# Patient Record
Sex: Male | Born: 1980 | Race: Black or African American | Hispanic: No | Smoking: Never smoker
Health system: Southern US, Community
[De-identification: ages and names within clinical notes are randomized; demographics above are authoritative.]

## PROBLEM LIST (undated history)

## (undated) DIAGNOSIS — I5042 Chronic combined systolic (congestive) and diastolic (congestive) heart failure: Secondary | ICD-10-CM

## (undated) DIAGNOSIS — Z9989 Dependence on other enabling machines and devices: Secondary | ICD-10-CM

## (undated) DIAGNOSIS — G43909 Migraine, unspecified, not intractable, without status migrainosus: Secondary | ICD-10-CM

## (undated) DIAGNOSIS — I10 Essential (primary) hypertension: Secondary | ICD-10-CM

## (undated) DIAGNOSIS — T8149XA Infection following a procedure, other surgical site, initial encounter: Secondary | ICD-10-CM

## (undated) DIAGNOSIS — G4733 Obstructive sleep apnea (adult) (pediatric): Secondary | ICD-10-CM

## (undated) DIAGNOSIS — I428 Other cardiomyopathies: Secondary | ICD-10-CM

## (undated) DIAGNOSIS — J45909 Unspecified asthma, uncomplicated: Secondary | ICD-10-CM

## (undated) DIAGNOSIS — Z973 Presence of spectacles and contact lenses: Secondary | ICD-10-CM

---

## 2001-08-12 HISTORY — PX: KNEE ARTHROSCOPY: SHX127

## 2012-02-10 ENCOUNTER — Emergency Department (HOSPITAL_COMMUNITY): Payer: Self-pay

## 2012-02-10 ENCOUNTER — Encounter (HOSPITAL_COMMUNITY): Payer: Self-pay

## 2012-02-10 ENCOUNTER — Inpatient Hospital Stay (HOSPITAL_COMMUNITY)
Admission: AD | Admit: 2012-02-10 | Discharge: 2012-02-12 | DRG: 292 | Disposition: A | Discharge status: Home / Self Care | Payer: MEDICAID | Source: Ambulatory Visit | Attending: Internal Medicine | Admitting: Internal Medicine

## 2012-02-10 DIAGNOSIS — E669 Obesity, unspecified: Secondary | ICD-10-CM

## 2012-02-10 DIAGNOSIS — I519 Heart disease, unspecified: Secondary | ICD-10-CM | POA: Diagnosis present

## 2012-02-10 DIAGNOSIS — I160 Hypertensive urgency: Secondary | ICD-10-CM | POA: Diagnosis present

## 2012-02-10 DIAGNOSIS — G4730 Sleep apnea, unspecified: Secondary | ICD-10-CM | POA: Diagnosis present

## 2012-02-10 DIAGNOSIS — I509 Heart failure, unspecified: Principal | ICD-10-CM | POA: Diagnosis present

## 2012-02-10 DIAGNOSIS — Z6841 Body Mass Index (BMI) 40.0 and over, adult: Secondary | ICD-10-CM

## 2012-02-10 DIAGNOSIS — I43 Cardiomyopathy in diseases classified elsewhere: Secondary | ICD-10-CM | POA: Diagnosis present

## 2012-02-10 DIAGNOSIS — R0601 Orthopnea: Secondary | ICD-10-CM | POA: Diagnosis present

## 2012-02-10 DIAGNOSIS — I110 Hypertensive heart disease with heart failure: Principal | ICD-10-CM | POA: Diagnosis present

## 2012-02-10 DIAGNOSIS — I5043 Acute on chronic combined systolic (congestive) and diastolic (congestive) heart failure: Secondary | ICD-10-CM

## 2012-02-10 DIAGNOSIS — R079 Chest pain, unspecified: Secondary | ICD-10-CM | POA: Diagnosis present

## 2012-02-10 DIAGNOSIS — R0602 Shortness of breath: Secondary | ICD-10-CM

## 2012-02-10 DIAGNOSIS — I5020 Unspecified systolic (congestive) heart failure: Secondary | ICD-10-CM | POA: Diagnosis present

## 2012-02-10 DIAGNOSIS — G4733 Obstructive sleep apnea (adult) (pediatric): Secondary | ICD-10-CM

## 2012-02-10 DIAGNOSIS — I10 Essential (primary) hypertension: Secondary | ICD-10-CM

## 2012-02-10 HISTORY — DX: Essential (primary) hypertension: I10

## 2012-02-10 LAB — CARDIAC PANEL(CRET KIN+CKTOT+MB+TROPI)
CK, MB: 4.4 ng/mL — ABNORMAL HIGH (ref 0.3–4.0)
Total CK: 313 U/L — ABNORMAL HIGH (ref 7–232)
Troponin I: <0.3 ng/mL (ref <0.30)
Troponin I: <0.3 ng/mL (ref <0.30)

## 2012-02-10 LAB — CBC WITH DIFFERENTIAL/PLATELET
Basophils Absolute: 0.1 10*3/uL (ref 0.0–0.1)
Basophils Relative: 1 % (ref 0–1)
Eosinophils Relative: 1 % (ref 0–5)
HCT: 45.4 % (ref 39.0–52.0)
MCHC: 32.4 g/dL (ref 30.0–36.0)
MCV: 86 fL (ref 78.0–100.0)
Monocytes Absolute: 0.6 10*3/uL (ref 0.1–1.0)
RDW: 12.9 % (ref 11.5–15.5)

## 2012-02-10 LAB — POCT I-STAT TROPONIN I

## 2012-02-10 LAB — BASIC METABOLIC PANEL
Calcium: 9.2 mg/dL (ref 8.4–10.5)
Creatinine, Ser: 1.16 mg/dL (ref 0.50–1.35)
GFR calc Af Amer: >90 mL/min (ref >90)
GFR calc non Af Amer: 83 mL/min — ABNORMAL LOW (ref >90)

## 2012-02-10 LAB — PRO B NATRIURETIC PEPTIDE: Pro B Natriuretic peptide (BNP): 422.3 pg/mL — ABNORMAL HIGH (ref 0–125)

## 2012-02-10 MED ORDER — CARVEDILOL 6.25 MG PO TABS
6.2500 mg | ORAL_TABLET | Freq: Two times a day (BID) | ORAL | Status: DC
  Administered 2012-02-11 – 2012-02-12 (×3): 6.25 mg via ORAL
  Filled 2012-02-10 (×7): qty 1

## 2012-02-10 MED ORDER — LABETALOL HCL 5 MG/ML IV SOLN
10.0000 mg | INTRAVENOUS | Status: DC | PRN
  Filled 2012-02-10: qty 4

## 2012-02-10 MED ORDER — ONDANSETRON HCL 4 MG/2ML IJ SOLN: 4.0000 mg | Freq: Three times a day (TID) | INTRAMUSCULAR | Status: DC | PRN

## 2012-02-10 MED ORDER — ACETAMINOPHEN 650 MG RE SUPP: 650.0000 mg | Freq: Four times a day (QID) | RECTAL | Status: DC | PRN

## 2012-02-10 MED ORDER — ONDANSETRON HCL 4 MG/2ML IJ SOLN: 4.0000 mg | Freq: Four times a day (QID) | INTRAMUSCULAR | Status: DC | PRN

## 2012-02-10 MED ORDER — SODIUM CHLORIDE 0.9 % IJ SOLN: 3.0000 mL | INTRAMUSCULAR | Status: DC | PRN

## 2012-02-10 MED ORDER — NITROGLYCERIN 0.4 MG SL SUBL: 0.4000 mg | SUBLINGUAL_TABLET | SUBLINGUAL | Status: DC | PRN

## 2012-02-10 MED ORDER — ASPIRIN 81 MG PO CHEW
324.0000 mg | CHEWABLE_TABLET | Freq: Once | ORAL | Status: AC
  Administered 2012-02-10: 324 mg via ORAL
  Filled 2012-02-10: qty 4

## 2012-02-10 MED ORDER — CARVEDILOL 3.125 MG PO TABS
3.1250 mg | ORAL_TABLET | Freq: Two times a day (BID) | ORAL | Status: DC
  Administered 2012-02-10: 3.125 mg via ORAL
  Filled 2012-02-10 (×2): qty 1

## 2012-02-10 MED ORDER — SODIUM CHLORIDE 0.9 % IJ SOLN
3.0000 mL | Freq: Two times a day (BID) | INTRAMUSCULAR | Status: DC
  Administered 2012-02-11 (×2): 3 mL via INTRAVENOUS

## 2012-02-10 MED ORDER — FUROSEMIDE 10 MG/ML IJ SOLN
40.0000 mg | Freq: Every day | INTRAMUSCULAR | Status: DC
  Administered 2012-02-10 – 2012-02-11 (×2): 40 mg via INTRAVENOUS
  Filled 2012-02-10 (×3): qty 4

## 2012-02-10 MED ORDER — ASPIRIN EC 81 MG PO TBEC
81.0000 mg | DELAYED_RELEASE_TABLET | Freq: Every day | ORAL | Status: DC
Start: 2012-02-10 — End: 2012-02-12
  Administered 2012-02-11 – 2012-02-12 (×2): 81 mg via ORAL
  Filled 2012-02-10 (×3): qty 1

## 2012-02-10 MED ORDER — ONDANSETRON HCL 4 MG PO TABS: 4.0000 mg | ORAL_TABLET | Freq: Four times a day (QID) | ORAL | Status: DC | PRN

## 2012-02-10 MED ORDER — SODIUM CHLORIDE 0.9 % IJ SOLN
3.0000 mL | Freq: Two times a day (BID) | INTRAMUSCULAR | Status: DC
  Administered 2012-02-10 – 2012-02-12 (×5): 3 mL via INTRAVENOUS

## 2012-02-10 MED ORDER — ACETAMINOPHEN 325 MG PO TABS: 650.0000 mg | ORAL_TABLET | Freq: Four times a day (QID) | ORAL | Status: DC | PRN

## 2012-02-10 MED ORDER — LISINOPRIL 20 MG PO TABS
20.0000 mg | ORAL_TABLET | Freq: Every day | ORAL | Status: DC
  Administered 2012-02-10 – 2012-02-12 (×3): 20 mg via ORAL
  Filled 2012-02-10 (×3): qty 1

## 2012-02-10 MED ORDER — SODIUM CHLORIDE 0.9 % IV SOLN
INTRAVENOUS | Status: DC
  Administered 2012-02-10: 125 mL/h via INTRAVENOUS

## 2012-02-10 NOTE — H&P (Signed)
Hospital Admission Note Date: 02/10/2012  Patient name: Mark Baker Medical record number: [id number] Date of birth: 1981-05-04 Age: 31 y.o. Gender: male PCP: Pcp Not In System  Medical Service: Internal Medicine Teaching Service, [facility] Service  Attending physician:  Dr. Doneen Poisson    1st Contact: Dr. Bronson Curb  Pager: [phone] 2nd Contact: Dr. Lyn Hollingshead   Pager: [phone] After 5 pm or weekends: 1st Contact:      Pager: [phone] 2nd Contact:      Pager: [phone]  Chief Complaint: Chest pain  History of Present Illness: Mark Baker is a 31 year old morbidly obese man with no previous PMH who presented to the [facility] ED with complaints of chest pain, DOE, and orthopnea.  The DOE has been since May 2013.  He states that when he is active, such as walking up stairs, or playing basketball, that he gets more short of breath then previously.  The chest pain then started about a week ago.  He states it is a substernal chest pain on the left side of his chest and goes down toward his left side.  The pain is a tightness and he feels like he can't catch his breath.  He states that it does get worse when he walks and is active but that it can come on when he is even just sitting down or laying down.  He denies diaphoresis, nausea, vomiting, diarrhea, and constipation. He is not a smoker, does not drink daily, and does not use any drugs.  He states that his father has heart failure and recently underwent pacemaker placement at age 60 for heart failure but no other cardiac history.    He also states that he has had problems sleeping for the last several months. He states that he has had to sleep kneeling at the side of the bed with his head on the bed, sitting up.  He states that this has gotten worse over the last week.  He states that if he lays flat that he feels like he is choking.  His wife, who is also in the room, states that at night he sounds like he stops breathing for 10-20 seconds at a time and  then "like he is catching up" after he stops.    Meds: None  Allergies: Allergies as of 02/10/2012  . (No Known Allergies)   Past Medical History  Diagnosis Date  . Asthma    Past Surgical History  Procedure Date  . Anterior cruciate ligament repair    Family History  Problem Relation Age of Onset  . Hypertension Father   . Heart failure Father   . Hypertension Sister   . Stroke Maternal Grandmother    History   Social History  . Marital Status: Married    Spouse Name: N/A    Number of Children: N/A  . Years of Education: N/A   Occupational History  . Not on file.   Social History Main Topics  . Smoking status: Never Smoker   . Smokeless tobacco: Not on file  . Alcohol Use: Yes     once a month  . Drug Use: No  . Sexually Active:    Other Topics Concern  . Not on file   Social History Narrative   Lives in [place] with wife of 4 years.  Works for [employer] at [employer].  1 son and 2 daughters.     Review of Systems: Constitutional: Denies fever, chills, diaphoresis, appetite change and fatigue.  HEENT: Denies photophobia, eye pain, redness, hearing loss, ear pain, congestion, sore throat, rhinorrhea, sneezing, mouth sores, trouble swallowing, neck pain, neck stiffness and tinnitus.   Respiratory: Positive for DOE, chest tightness.  Denies SOB, cough, and wheezing.   Cardiovascular: Positive for chest pain and leg swelling. Denies palpitations  Gastrointestinal: Denies nausea, vomiting, abdominal pain, diarrhea, constipation, blood in stool and abdominal distention.  Genitourinary: Denies dysuria, urgency, frequency, hematuria, flank pain and difficulty urinating.  Musculoskeletal: Denies myalgias, back pain, joint swelling, arthralgias and gait problem.  Skin: Denies pallor, rash and wound.  Neurological: Denies dizziness, seizures, syncope, weakness, light-headedness, numbness and headaches.  Hematological: Denies adenopathy. Easy bruising, personal or family  bleeding history  Psychiatric/Behavioral: Denies suicidal ideation, mood changes, confusion, nervousness, sleep disturbance and agitation  Physical Exam: Blood pressure 179/115, pulse 112, temperature 99.1 F (37.3 C), temperature source Oral, resp. rate 22, height 6\' 3"  (1.905 m), weight 431 lb (195.5 kg), SpO2 97.00%. Constitutional: Vital signs reviewed.  Patient is a well-developed and well-nourished obese man in no acute distress and cooperative with exam. Alert and oriented x3.  Head: Normocephalic and atraumatic Ear: TM normal bilaterally Mouth: no erythema or exudates, MMM Eyes: PERRL, EOMI, conjunctivae normal, No scleral icterus.  no optical disc cupping or AV nicking Neck: Supple, Trachea midline normal ROM, unable to assess JVD because of the size of his neck.  no mass, thyromegaly, or carotid bruit present.  Cardiovascular: Tachycardic but regular S1 normal, S2 normal, no MRG, pulses symmetric and intact bilaterally Pulmonary/Chest: Mild bibasilar crackles on inspiration. no wheezes, or rhonchi Abdominal: obese, Soft. Non-tender, non-distended, bowel sounds are normal, no masses, organomegaly, or guarding present.  GU: no CVA tenderness Musculoskeletal: 2+ pitting edema to the bilateral knees.  No joint deformities, erythema, or stiffness, ROM full and no nontender Hematology: no cervical, inginal, or axillary adenopathy.  Neurological: A&O x3, Strength is normal and symmetric bilaterally, cranial nerve II-XII are grossly intact, no focal motor deficit, sensory intact to light touch bilaterally.  Skin: Warm, dry and intact. No rash, cyanosis, or clubbing.  Psychiatric: Normal mood and affect. speech and behavior is normal. Judgment and thought content normal. Cognition and memory are normal.   Lab results: Basic Metabolic Panel:  [facility] 02/10/12 0835  NA 142  K 3.9  CL 103  CO2 28  GLUCOSE 111*  BUN 11  CREATININE 1.16  CALCIUM 9.2  MG --  PHOS --    CBC:  Basename 02/10/12 0835  WBC 7.6  NEUTROABS 5.0  HGB 14.7  HCT 45.4  MCV 86.0  PLT 272   BNP:  Basename 02/10/12 0838  PROBNP 422.3*   CBG:  Basename 02/10/12 0835  GLUCAP 101*   Imaging results:  Dg Chest 2 View  02/10/2012  *RADIOLOGY REPORT*  Clinical Data: Shortness of breath, chest pain  CHEST - 2 VIEW  Comparison: None.  Findings: Borderline cardiomegaly.  There is mild interstitial prominence bilaterally with perihilar increased bronchial markings. The findings suspicious for mild interstitial edema.  No focal infiltrate.  IMPRESSION: Mild perihilar interstitial prominence with crowding of bronchovascular markings suspicious for interstitial edema.  No focal infiltrate.  Borderline cardiomegaly.  Original Report Authenticated By: Natasha Mead, M.D.   Other results: EKG: sinus tachycardia, likely biatrial enlargement no ST segment changes.   Assessment & Plan by Problem: 1. Chest pain: Mr. Gassett presented with substernal chest pain that is at least partially worsened with activity.  He also has significant orthopnea, bilateral pitting edema, and elevated  blood pressure.  Differential diagnosis includes unstable angina or new onset congestive heart failure.  He has a family history of heart failure as well as hypertension and was told that he should be taking medications for his blood pressure 4 years ago.    - Admit to telemetry  - Cycle CE x3  - Repeat EKG in am   - Nitro PRN  - 2D echo  - TSH, FLP, and A1C for risk stratification  - Lasix 40 mg IV daily  - Strict I&O and daily weights  - Consider Cards consult for possible ischemic workup.  He is too large for coronary CT as well as the treadmill.  He might also be too large for the Lexiscan.    2. Hypertensive Urgency:  On presentation to the ED his blood pressure was 203/136.  He has no symptoms of end organ damage other then cardiomegaly which likely indicates that he has had hypertension for sometime.  He does  not remember the name of the medications he was given previously but with his question of possible CHF we will initiate treatment as if he does have CHF.    - Start Lisinopril 20 mg and Coreg 3.125 mg BID.    - PRN Labetolol for SBP <180.   3. Sleep apnea: With his wife's report of sleep disordered breathing and apnea episodes we will start him on CPAP while he is in the hospital.  This is also likely contributing to his possible CHF as well as directly to his hypertension.  He will need a sleep study as an outpatient to qualify for long term CPAP therapy.    4.  Morbid Obesity: This is likely contributing to his blood pressure and sleep apnea.  We discussed that he needs to start working on losing some weight to help prevent the complications that come with his obesity.  We will have nutritional therapy come and give him information for a heart healthy diet and also a possible weight loss diet.   5.  Disposition:  He needs to get set up with a PCP and currently has no insurance even though he works for [employer].  I will have CSW come by to see if there is any resources that he can take to help him obtain a CPAP as well as his medications until he is able to sign up for insurance during open enrollment.    6.  VTE: SCDs  Signed: Cricket Goodlin 02/10/2012, 12:34 PM

## 2012-02-10 NOTE — ED Notes (Signed)
Spoke to Jones, Charity fundraiser in CDU.  Transporting pt to CDU 7.

## 2012-02-10 NOTE — ED Notes (Signed)
Report called to accepting RN on 2000. Will hold patient here 25 minutes to accommodate staffing.

## 2012-02-10 NOTE — ED Provider Notes (Signed)
31 year old male has been having episodes of chest tightness. He is also noted some dyspnea and fatigue. Tetanus is not definitely exertional and he stated as they're mostly in the morning. He has a history of hypertension but is not on any medication for quite some time. Exam shows an obese male in no acute distress. Lungs are clear and heart has regular rate and rhythm. There is 2+ pitting edema which patient was not aware of. Blood pressure is very elevated. There are no old records to see what his blood pressure had been previously. There is also a family history of premature cardiac disease. He will need to be admitted to the CDU under her chest pain protocol for serial cardiac markers and stress testing. His BMI is too high to get coronary CT scan. He'll also need to be started on medication for his blood pressure.   Date: 02/10/2012  Rate: 110  Rhythm: sinus tachycardia  QRS Axis: normal  Intervals: normal  ST/T Wave abnormalities: normal  Conduction Disutrbances:none  Narrative Interpretation: Sinus tachycardia, left atrial hypertrophy, right atrial hypertrophy. No old ECG available for comparison.  Old EKG Reviewed: none available    Dione Booze, MD 02/10/12 [phone]

## 2012-02-10 NOTE — ED Provider Notes (Signed)
History     CSN: [id number]  Arrival date & time 02/10/12  0807   First MD Initiated Contact with Patient 02/10/12 [phone]      Chief Complaint  Patient presents with  . Chest Pain    (Consider location/radiation/quality/duration/timing/severity/associated sxs/prior treatment) HPI  Patient who is morbidly obese and has been told in the past that he has high blood pressure however he is not taking medicines on a daily basis and is not managed by primary care provider presents to emergency department complaining of a one to two-month history of shortness of breath and orthopnea stating that he does not ever get a goodnight sleep due to tossing and turning and feeling short of breath at night. Patient states that there has been some nights that he's actually kneeled at the edge of the bed and slept that over the bed sitting up. Patient is also complaining of a one week history of intermittent substernal chest tightness with associated shortness of breath. Patient states that the chest pain and shortness of breath is intermittent and happens sometimes when he is exerting himself and sometimes at rest. Patient denies any associated diaphoresis, nausea, vomiting, or radiation of pain. Patient states that his father is 67 and has recently required a pacemaker but that he has no other known information about his cardiac health and denies any other known family history of early cardiac disease. Patient denies tobacco use. He denies illicit drug use. Patient has taken nothing for symptoms prior to arrival. Patient states he last had CP at 5am this morning lasting until approximately 5:05 and states "I just got tire of feeling so bad so I decided to get this checked out."   No past medical history on file.  No past surgical history on file.  No family history on file.  History  Substance Use Topics  . Smoking status: Not on file  . Smokeless tobacco: Not on file  . Alcohol Use: Not on file       Review of Systems  All other systems reviewed and are negative.    Allergies  Review of patient's allergies indicates no known allergies.  Home Medications  No current outpatient prescriptions on file.  BP 203/136  Pulse 113  Temp 98.1 F (36.7 C) (Oral)  Resp 22  SpO2 95%  Physical Exam  Nursing note and vitals reviewed. Constitutional: He is oriented to person, place, and time. He appears well-developed. No distress.       morbidly obese  HENT:  Head: Normocephalic and atraumatic.  Eyes: Conjunctivae are normal.  Neck: Normal range of motion. Neck supple.  Cardiovascular: Normal rate, regular rhythm, normal heart sounds and intact distal pulses.  Exam reveals no gallop and no friction rub.   No murmur heard. Pulmonary/Chest: Effort normal and breath sounds normal. No respiratory distress. He has no wheezes. He has no rales. He exhibits no tenderness.       Poor lung exam given body habitus with breath sound difficult to auscultate but no resp distress  Abdominal: Soft. Bowel sounds are normal. He exhibits no distension and no mass. There is no tenderness. There is no rebound and no guarding.  Musculoskeletal: Normal range of motion. He exhibits edema. He exhibits no tenderness.       Trace edema bilaterally of bilateral LE.   Neurological: He is alert and oriented to person, place, and time.  Skin: Skin is warm and dry. No rash noted. He is not diaphoretic. No erythema.  Psychiatric:  He has a normal mood and affect.    ED Course  Procedures (including critical care time)  PO ASA, IV fluids TKO  Labs Reviewed  BASIC METABOLIC PANEL - Abnormal; Notable for the following:    Glucose, Bld 111 (*)     GFR calc non Af Amer 83 (*)     All other components within normal limits  PRO B NATRIURETIC PEPTIDE - Abnormal; Notable for the following:    Pro B Natriuretic peptide (BNP) 422.3 (*)     All other components within normal limits  GLUCOSE, CAPILLARY - Abnormal;  Notable for the following:    Glucose-Capillary 101 (*)     All other components within normal limits  CBC WITH DIFFERENTIAL  POCT I-STAT TROPONIN I   Dg Chest 2 View  02/10/2012  *RADIOLOGY REPORT*  Clinical Data: Shortness of breath, chest pain  CHEST - 2 VIEW  Comparison: None.  Findings: Borderline cardiomegaly.  There is mild interstitial prominence bilaterally with perihilar increased bronchial markings. The findings suspicious for mild interstitial edema.  No focal infiltrate.  IMPRESSION: Mild perihilar interstitial prominence with crowding of bronchovascular markings suspicious for interstitial edema.  No focal infiltrate.  Borderline cardiomegaly.  Original Report Authenticated By: Natasha Mead, M.D.     1. HTN (hypertension)   2. Obesity   3. Chest pain   4. SOB (shortness of breath)     Dr. Preston Fleeting agreeable to assessment and plan.   MDM  VSS though remains mildly tach and hypertense denies current CP. OPC to see patient in ER to admit.         [place], [place] 02/10/12 1032

## 2012-02-10 NOTE — ED Notes (Signed)
Heart Healthy tray ordered spoke with Eber Jones

## 2012-02-10 NOTE — ED Notes (Signed)
Pt reports 2 months of intermittent central chest tightness.  Pt reports will have SOB at times, but not all the time.  Pt states he was on BP meds about 4 years ago, but MD only gave him 30 day supply.  Pt denies he was to follow up about BP.  Pt is hypertensive here.  Current BP  180/127.  Pt denies any pain at this time.

## 2012-02-10 NOTE — ED Notes (Signed)
Report called to 2005 RN: Shanda Bumps.

## 2012-02-10 NOTE — ED Notes (Signed)
Pt's CBG was 101 when I checked it. 8:38am JG.

## 2012-02-11 ENCOUNTER — Encounter (HOSPITAL_COMMUNITY): Payer: Self-pay | Admitting: General Practice

## 2012-02-11 LAB — LIPID PANEL
Cholesterol: 188 mg/dL (ref 0–200)
LDL Cholesterol: 117 mg/dL — ABNORMAL HIGH (ref 0–99)
VLDL: 36 mg/dL (ref 0–40)

## 2012-02-11 LAB — CARDIAC PANEL(CRET KIN+CKTOT+MB+TROPI)
CK, MB: 3.9 ng/mL (ref 0.3–4.0)
Relative Index: 1.5 (ref 0.0–2.5)
Troponin I: <0.3 ng/mL (ref <0.30)

## 2012-02-11 LAB — BASIC METABOLIC PANEL
CO2: 29 mEq/L (ref 19–32)
Calcium: 9 mg/dL (ref 8.4–10.5)
Creatinine, Ser: 1.12 mg/dL (ref 0.50–1.35)
GFR calc non Af Amer: 86 mL/min — ABNORMAL LOW (ref >90)

## 2012-02-11 LAB — TSH: TSH: 0.618 u[IU]/mL (ref 0.350–4.500)

## 2012-02-11 LAB — HEMOGLOBIN A1C: Mean Plasma Glucose: 114 mg/dL (ref <117)

## 2012-02-11 NOTE — Progress Notes (Signed)
Clinical Social Work Department BRIEF PSYCHOSOCIAL ASSESSMENT 02/11/2012  Patient:  Mark Baker, Mark Baker     Account Number:  [id number]     Admit date:  02/10/2012  Clinical Social Worker:  Conley Simmonds  Date/Time:  02/11/2012 03:00 PM  Referred by:  Physician  Date Referred:  02/11/2012 Referred for  Psychosocial assessment   Other Referral:   Interview type:  Patient Other interview type:    PSYCHOSOCIAL DATA Living Status:  FAMILY Admitted from facility:   Level of care:   Primary support name:  Rande Lawman- Primary support relationship to patient:  SPOUSE Degree of support available:   Strong    CURRENT CONCERNS Current Concerns  Financial Resources   Other Concerns:    SOCIAL WORK ASSESSMENT / PLAN CSW met with pt at bedside to discuss financial concerns and medication assistance-pt is an employee of [employer] and is currently uninsured has discussed options including outpatient pharmacy with regards to prescriptions.  Pt has stable housing and employment at this time   Assessment/plan status:  Other - See comment Other assessment/ plan:   Information/referral to community resources:   [facility]    PATIENT'S/FAMILY'S RESPONSE TO PLAN OF CARE: Pt in agreement with plan of care in terms of medication assistance and is also in agreement with follow up in [facility]-  If pt has appointment arranged CSW will notify OPCSW and will continue to follow in order to assistance with medication management-- Jodean Lima, [phone]

## 2012-02-11 NOTE — Progress Notes (Signed)
Checked with pt about CPAP and he states he can place himself on later when he is ready to go to sleep. Told to notify RN for Respiratory if he needed any further assistance during the night.

## 2012-02-11 NOTE — Plan of Care (Signed)
Problem: Not Ready for Diet/Lifestyle Change (NB-1.3) Goal: Nutrition education Formal process to instruct or train a patient/client in a skill or to impart knowledge to help patients/clients voluntarily manage or modify food choices and eating behavior to maintain or improve health.  Outcome: Completed/Met Date Met:  02/11/12 RD consult for Heart Healthy/Weight Loss education. Reviewed guidelines and recommendations. Patient not very interested in sharing typical eating behavior/preferences. Handouts provided from Academy [employer]etics: Heart Healthy Nutrition Therapy & Weight Loss Tips. Expect poor compliance. PO intake 100%; BMI = 54 kg/m2 (Obesity Class III). No further intervention indicated at this time.  Alger Memos Pager #: [phone]

## 2012-02-11 NOTE — H&P (Signed)
Internal Medicine Attending Admission Note Date: 02/11/2012  Patient name: Mark Baker Medical record number: [id number] Date of birth: 19-Jul-1981 Age: 31 y.o. Gender: male  I saw and evaluated the patient. I reviewed the resident's note and I agree with the resident's findings and plan as documented in the resident's note.  Chief Complaint(s):  Chest tightness and dyspnea on exertion.  History - key components related to admission:  Mark Baker is a 31 year old man with a history of hypertension for4 years who has not seen a physician since diagnosis who presents with chest tightness and worsening dyspnea on exertion and generalized fatigue for 2 months. The symptoms have been progressive over that time. Over the last week he notes chest pain that is left of the sternum and characterized as a poking sensation that can occur with exertion or at rest. This pain is associated with occasional dyspnea but does not radiate and is not associated with nausea or diaphoresis. The discomfort is relieved with leaning forward. He denies a history of previous tobacco use and also denies drug use. Of note, his father has a history of heart disease that sounds like atrial fibrillation. He apparently was not compliant and recently had an AV ablation with pacemaker placement as best as we can tell. Mark Baker admits that his father has not been taking care of himself by using drugs and being noncompliant with his medical regimen. Also of note, his wife witnesses apneic episodes during the night followed by him getting up gasping for air. He admits to occasional orthopnea at 2 pillows but at other times is able to lie flat without any problems. As the symptoms were progressive over the last several months he decided to present to the emergency department for further evaluation.  Physical Exam - key components related to admission:  Filed Vitals:   02/10/12 1741 02/10/12 2058 02/11/12 0400 02/11/12 1321  BP: 169/108  175/109 157/109 170/103  Pulse: 111 101 96 100  Temp:  98.4 F (36.9 C) 97.7 F (36.5 C) 98.9 F (37.2 C)  TempSrc:  Oral Oral Oral  Resp:  22 20 19   Height:      Weight:      SpO2:  99% 95% 100%   General: Well-developed, well-nourished, morbidly obese man sitting comfortably in bed in no acute distress. Lungs: Clear to auscultation bilaterally without wheezes, rhonchi, or rales. Heart: Regular rate and rhythm without murmurs, rubs, or gallops. Abdomen: Soft, nontender, active bowel sounds. Extremities: Trace pitting edema bilaterally.  Lab results:  Basic Metabolic Panel:  Basename 02/11/12 0313 02/10/12 0835  NA 143 142  K 3.9 3.9  CL 103 103  CO2 29 28  GLUCOSE 105* 111*  BUN 12 11  CREATININE 1.12 1.16  CALCIUM 9.0 9.2  MG -- --  PHOS -- --   CBC:  Basename 02/10/12 0835  WBC 7.6  NEUTROABS 5.0  HGB 14.7  HCT 45.4  MCV 86.0  PLT 272   Cardiac Enzymes:  Basename 02/11/12 0313 02/10/12 2146 02/10/12 1528  CKTOTAL 260* 298* 313*  CKMB 3.9 4.2* 4.4*  CKMBINDEX -- -- --  TROPONINI <0.30 <0.30 <0.30   CBG:  Basename 02/10/12 0835  GLUCAP 101*   Hemoglobin A1C:  Basename 02/10/12 1558  HGBA1C 5.6   Fasting Lipid Panel:  Basename 02/11/12 0313  CHOL 188  HDL 35*  LDLCALC 117*  TRIG 180*  CHOLHDL 5.4  LDLDIRECT --   Thyroid Function Tests:  Basename 02/10/12 1558  TSH 0.618  T4TOTAL --  FREET4 --  T3FREE --  THYROIDAB --   Imaging results:   CXR Cardiomegaly, otherwise unremarkable  Other results:  EKG: Normal sinus tachycardia at 110 beats per minute, normal axis, normal intervals, poor R wave progression, no ST or T wave changes.  Assessment & Plan by Problem:  Mark Baker is a 31 year old morbidly obese man with a history of untreated hypertension who presents with progressive shortness of breath and chest tightness. The differential includes a hypertrophic cardiomyopathy related to the untreated hypertension that is resulting in  diastolic dysfunction. The differential includes a dilated cardiomyopathy related to long-standing untreated hypertension and left ventricular systolic dysfunction. He does not have a history of a prodrome to suggest he's got an acute viral cardiomyopathy. There is no evidence that he has other diseases that would be consistent with a infiltrative cardiomyopathy.  1) Possible cardiomyopathy: We are waiting the results of the echocardiogram that was obtained this afternoon. If it shows diastolic dysfunction and left ventricular hypertrophy we will aggressively treat his blood pressure with ACE and beta blockade. Further titration of these medications in addition to other sntihypertensives if necessary can take place in the outpatient clinic. If he is found to have left ventricular dysfunction we will add Lasix to the above regimen.  2) Hypertension: We will continue lisinopril and the beta blocker. If he has normal left ventricular function we will consider the addition of hydrochlorothiazide as a diuretic and antihypertensive. Obviously if he's got left ventricular dysfunction we will replace the hydrochlorothiazide with Lasix. His hypertension is chronic and will require further titration of medications in the outpatient clinic.  3) Disposition: I agree with the housestaff's plan to discharge Mark Baker home today after we get the results of the echocardiogram which will help guide our outpatient management. [facility] within one to 2 weeks. We will also try to arrange a sleep study given our suspicion for a diagnosis of obstructive sleep apnea.

## 2012-02-11 NOTE — Progress Notes (Signed)
Subjective: Pt was seen this morning after returning from echo. Sitting in bed playing with children and eating lunch. He says he has no complaints. Denies CP, SOB, headaches, N/V, dysuria. Did not require prn antihypertensives last night.   Objective: Vital signs in last 24 hours: Filed Vitals:   02/10/12 1741 02/10/12 2058 02/11/12 0400 02/11/12 1321  BP: 169/108 175/109 157/109 170/103  Pulse: 111 101 96 100  Temp:  98.4 F (36.9 C) 97.7 F (36.5 C) 98.9 F (37.2 C)  TempSrc:  Oral Oral Oral  Resp:  22 20 19   Height:      Weight:      SpO2:  99% 95% 100%   Weight change:   Intake/Output Summary (Last 24 hours) at 02/11/12 1336 Last data filed at 02/11/12 1230  Gross per 24 hour  Intake    840 ml  Output      0 ml  Net    840 ml   BP 170/103  Pulse 100  Temp 98.9 F (37.2 C) (Oral)  Resp 19  Ht 6\' 3"  (1.905 m)  Wt 195.5 kg (431 lb)  BMI 53.87 kg/m2  SpO2 100%  General Appearance:    Alert, cooperative, no distress  Head:    Normocephalic, without obvious abnormality, atraumatic  Eyes:    PERRL, conjunctiva/corneas clear, EOM's intact   Throat:   Lips, mucosa, and tongue normal; teeth and gums normal  Lungs:     Clear to auscultation bilaterally, respirations unlabored  Heart:    Regular rate and rhythm, S1 and S2 normal, no murmur, rub   or gallop  Abdomen:     Soft, non-tender, bowel sounds active all four quadrants,    no masses, no organomegaly  Extremities:   Trace bilateral LE edema  Pulses:   2+ and symmetric all extremities  Neurologic:   CNII-XII intact.    Lab Results: Basic Metabolic Panel:  Lab 02/11/12 0865 02/10/12 0835  NA 143 142  K 3.9 3.9  CL 103 103  CO2 29 28  GLUCOSE 105* 111*  BUN 12 11  CREATININE 1.12 1.16  CALCIUM 9.0 9.2  MG -- --  PHOS -- --   CBC:  Lab 02/10/12 0835  WBC 7.6  NEUTROABS 5.0  HGB 14.7  HCT 45.4  MCV 86.0  PLT 272   Cardiac Enzymes:  Lab 02/11/12 0313 02/10/12 2146 02/10/12 1528  CKTOTAL 260* 298*  313*  CKMB 3.9 4.2* 4.4*  CKMBINDEX -- -- --  TROPONINI <0.30 <0.30 <0.30   BNP:  Lab 02/10/12 0838  PROBNP 422.3*  CBG:  Lab 02/10/12 0835  GLUCAP 101*   Hemoglobin A1C:  Lab 02/10/12 1558  HGBA1C 5.6   Fasting Lipid Panel:  Lab 02/11/12 0313  CHOL 188  HDL 35*  LDLCALC 117*  TRIG 180*  CHOLHDL 5.4  LDLDIRECT --   Thyroid Function Tests:  Lab 02/10/12 1558  TSH 0.618  T4TOTAL --  FREET4 --  T3FREE --  THYROIDAB --   Studies/Results: Dg Chest 2 View  02/10/2012  *RADIOLOGY REPORT*  Clinical Data: Shortness of breath, chest pain  CHEST - 2 VIEW  Comparison: None.  Findings: Borderline cardiomegaly.  There is mild interstitial prominence bilaterally with perihilar increased bronchial markings. The findings suspicious for mild interstitial edema.  No focal infiltrate.  IMPRESSION: Mild perihilar interstitial prominence with crowding of bronchovascular markings suspicious for interstitial edema.  No focal infiltrate.  Borderline cardiomegaly.  Original Report Authenticated By: Natasha Mead, M.D.  Medications: I have reviewed the patient's current medications. Scheduled Meds:   . aspirin EC  81 mg Oral Daily  . carvedilol  6.25 mg Oral BID WC  . furosemide  40 mg Intravenous Daily  . lisinopril  20 mg Oral Daily  . sodium chloride  3 mL Intravenous Q12H  . sodium chloride  3 mL Intravenous Q12H  . DISCONTD: carvedilol  3.125 mg Oral BID WC   Continuous Infusions:  PRN Meds:.acetaminophen, acetaminophen, nitroGLYCERIN, ondansetron (ZOFRAN) IV, ondansetron, sodium chloride, DISCONTD: labetalol, DISCONTD: ondansetron (ZOFRAN) IV  Assessment/Plan: Assessment & Plan by Problem:  1. Chest pain: Mr. Leather presented with substernal chest pain that is at least partially worsened with activity. He also has significant orthopnea, bilateral pitting edema, and elevated blood pressure. niital concern for unstable angina. CE negative x3 and no EKG changes suggesting acute or remote  ischemia. Also concern for some new onset congestive heart failure given a  family history of heart failure as well as hypertension. Sleep apnea may also be contributing to R sided heart stress. Risk stratification shows normal TSH, lipids slightly elevated w TotChl 188, LDL 117 and HDL 35, but not fasting. A1c is normal at 5.6. - Nitro PRN  - 2D echo pending. Consider Cards consult for possible ischemic workup. He is too large for coronary CT as well as the treadmill. He might also be too large for the Lexiscan. If no acute decompensation requiring immediate intervention, will discharge today for follow-up as outpt.  - Lasix 40 mg IV qd  - Strict I&O and daily weights    2. Hypertensive Urgency: On presentation to the ED his blood pressure was 203/136. He has no symptoms of end organ damage other then cardiomegaly which likely indicates that he has had hypertension for sometime. He does not remember the name of the medications he was given previously but with his question of possible CHF we will initiate treatment as if he does have CHF.  Started Lisinopril 20 mg and Coreg 3.125 mg BID. Increased coreg to 6.25 BID as pt remained tachy at low dose. BP lowered significantly to 157/109 this morning but still high. Will hold prn antihypertensives as pt has long standing HTN and we do not want to give him hypotension that will abruptly decrease cerebral perfusion pressure.  - Call MD for BP 180/110. If no sx, hold antihypertensives. If >220/120, consider low dose prn labetalol. .  3. Sleep apnea: With his wife's report of sleep disordered breathing and apnea episodes we will start him on CPAP while he is in the hospital. This is also likely contributing to his possible CHF as well as directly to his hypertension. He will need a sleep study as an outpatient to qualify for long term CPAP therapy.  -Sleep study as outpt  4. Morbid Obesity: This is likely contributing to his blood pressure and sleep apnea. We  discussed that he needs to start working on losing some weight to help prevent the complications that come with his obesity. We will have nutritional therapy come and give him information for a heart healthy diet and also a possible weight loss diet.   5. Disposition: He needs to get set up with a PCP and currently has no insurance even though he works for [employer]. I will have CSW come by to see if there is any resources that he can take to help him obtain a CPAP as well as his medications until he is able to sign up for insurance  during open enrollment. Social work to see today, will refer to case mgmt for possibly CPAP, although likely will need sleep study first.   6. VTE: SCDs    LOS: 1 day   Bronson Curb 02/11/2012, 1:36 PM

## 2012-02-11 NOTE — ED Provider Notes (Signed)
Medical screening examination/treatment/procedure(s) were conducted as a shared visit with non-physician practitioner(s) and myself.  I personally evaluated the patient during the encounter   Burl Tauzin, MD 02/11/12 0712 

## 2012-02-11 NOTE — Discharge Summary (Signed)
Internal Medicine Teaching [facility] Discharge Note  Name: Nadia Torr MRN: [id number] DOB: January 16, 1981 31 y.o.  Date of Admission: 02/10/2012  8:12 AM Date of Discharge: 02/12/2012 Attending Physician: Rocco Serene, MD  Discharge Diagnosis: Principal Problem:  *Chest pain Active Problems:  Hypertensive urgency  Orthopnea  Sleep apnea  Morbid obesity Systolic CHF w reduced LV EF, NYHA 2 Grade 2 diastolic dysfunction  Discharge Medications: Medication List  As of 02/12/2012  1:42 PM   TAKE these medications         carvedilol 6.25 MG tablet   Commonly known as: COREG   Take 1 tablet (6.25 mg total) by mouth 2 (two) times daily with a meal.      lisinopril-hydrochlorothiazide 20-12.5 MG per tablet   Commonly known as: PRINZIDE,ZESTORETIC   Take 1 tablet by mouth daily. Please take tablet in the morning.            Disposition and follow-up:   Mr.Syre Aamodt was discharged from [facility] in Good condition.  At the hospital follow up visit please address management of heart failure. We recommend: 1) The clinic provider consulting cards to evaluate if pt is candidate for cath to evaluate for diffuse ischemic cardiomyopathy.  2) Get BMet in clinic to follow-up renal function on ACE. Will will maximize his lisinopril to 40mg  qd. In the meantime. Will also increase HCTZ to 25qd and continue Coreg at 6.25BID as pt handled dose well in hospital.  3) Assess for need to initiate lasix therapy 4) If pt has SOB, fatiuge call oncall resident page [phone] . Recommend that resident on call prescribes 80mg  lasix qd x 3 days followed by 40mg  qd until clinic appointment We are also concerned as pt has symptoms of obstructive sleep apnea. We recommend sleep study.   Follow-up Appointments: Follow-up Information    Follow up with Annett Gula, MD on 02/27/2012. (Please come to the above address at 1:15 pm for your appt with Dr. Shirlee Latch)    Contact information:   [address] [place] [address] [phone]         Discharge Orders    Future Appointments: Provider: Department: Dept Phone: Center:   02/27/2012 1:30 PM Annett Gula, MD [facility] [phone] [facility]     Future Orders Please Complete By Expires   Diet - low sodium heart healthy      Increase activity slowly      Discharge instructions      Comments:   1. Please come to your clinic appointment on February 27, 2012 at the [facility] with Dr. Shirlee Latch at 1:30pm. We will need to check your kidney function and possibly adjust your blood pressure medications. 2. You have been prescribed 3 new medicines for your hypertension. You will take Coreg twice a day. The other pill is a combination pill of lisinopril and HCTZ. You will take this pill once in the morning. 3. We will call you with the results of your echocardiogram and let you know if you need to come in to the hospital or clinic before your scheduled appointment. 4. Please ask your doctor about ordering a sleep study to evaluate for sleep apnea. 5. Seek medical attention if you have sudden shortness of breath, chest pain, dizziness, if you make little or no urine, or if you have sudden swelling of arms and legs.   Call MD for:  persistant nausea and vomiting  Call MD for:  severe uncontrolled pain      Call MD for:  persistant dizziness or light-headedness      Call MD for:  difficulty breathing, headache or visual disturbances         Consultations:    Procedures Performed:  Dg Chest 2 View  02/10/2012  *RADIOLOGY REPORT*  Clinical Data: Shortness of breath, chest pain  CHEST - 2 VIEW  Comparison: None.  Findings: Borderline cardiomegaly.  There is mild interstitial prominence bilaterally with perihilar increased bronchial markings. The findings suspicious for mild interstitial edema.  No focal infiltrate.  IMPRESSION: Mild perihilar interstitial prominence with crowding of  bronchovascular markings suspicious for interstitial edema.  No focal infiltrate.  Borderline cardiomegaly.  Original Report Authenticated By: Natasha Mead, M.D.    2D Echo:  Study Conclusions: - Left ventricle: The cavity size was severely dilated. Systolic function was severely reduced. The estimated ejection fraction was in the range of 25% to 30%. Diffuse hypokinesis. Features are consistent with a pseudonormal left ventricular filling pattern, with concomitant abnormal relaxation and increased filling pressure (grade 2 diastolic dysfunction). Doppler parameters are consistent with both elevated ventricular end-diastolic filling pressure and elevated left atrial filling pressure. Increased echogenicity at apex, although probably a calcified chord,cannot completely exclude thrombus. - Mitral valve: Calcified annulus. Mildly thickened leaflets . Mild to moderate regurgitation. - Left atrium: The atrium was moderately dilated. - Atrial septum: No defect or patent foramen ovale was identified.     Admission HPI:  Mr. Richart is a 31 year old morbidly obese man with no previous PMH who presented to the [facility] ED with complaints of chest pain, DOE, and orthopnea. The DOE has been since May 2013. He states that when he is active, such as walking up stairs, or playing basketball, that he gets more short of breath then previously. The chest pain then started about a week ago. He states it is a substernal chest pain on the left side of his chest and goes down toward his left side. The pain is a tightness and he feels like he can't catch his breath. He states that it does get worse when he walks and is active but that it can come on when he is even just sitting down or laying down. He denies diaphoresis, nausea, vomiting, diarrhea, and constipation. He is not a smoker, does not drink daily, and does not use any drugs. He states that his father has heart failure and recently underwent pacemaker placement  at age 67 for heart failure but no other cardiac history.  He also states that he has had problems sleeping for the last several months. He states that he has had to sleep kneeling at the side of the bed with his head on the bed, sitting up. He states that this has gotten worse over the last week. He states that if he lays flat that he feels like he is choking. His wife, who is also in the room, states that at night he sounds like he stops breathing for 10-20 seconds at a time and then "like he is catching up" after he stops.   Hospital Course by problem list:  1. Chest pain:  Mr. Perlmutter presented with substernal chest tightness that is at least partially worsened with activity as well as orthopnea. There was initial concern for unstable angina. CE negative x3 and no EKG changes suggesting acute or remote ischemia. Risk stratification shows normal TSH, lipids slightly elevated w TotChl 188, LDL 117  and HDL 35, but not fasting. A1c is normal at 5.6. Also concern for some new onset congestive heart failure given a family history of heart failure as well as severe hypertension. We also were concerned that symptoms suggestive of sleep apnea might indicate R sided heart stress. He was given Lasix 40mg  IVx1 and echo was performed but reading pending at time of discharge. Ultimately, chest tightness resolved with treatment of blood pressure and ischemic workup was other wise negative. Upon further review, he denied any actual chest "pain," just chest tightness and some shortness of breath. His symptoms are most likely due to severe hypertension with high diastolic pressures putting stress on his heart. He remained free of chest pain, tightness, and SOB for the remainder of his admission. As pt was asymptomatic and expressed desire to leave hospital, we discharged him home with Echo pending. Echo report returned a few hours after patient left revealing severely dilated L ventricle and EF 25% as well as grade 2  diastolic dysfunction. 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 There is no focus of hypokinesis, which is reassuring that he might not have ischemic cardiomyopathy. Ultimately, he will need evaluation by a cardiologist. He has an appointment to follow up in our clinic at the next available appoitnment, Wed July 10 at 10:15 am w Dr. Zane Herald. We recommend 1) The clinic provider consulting cards to evaluate if pt is candidate for cath to evaluate for diffuse ischemic cardiomyopathy. 2) Get BMet in clinic to follow-up renal function on ACE. Will will maximize his lisinopril to 40mg  qd. In the meantime. Will also increase HCTZ to 25qd and continue Coreg at 6.25BID as pt handled dose well in hospital. 3) Assess for need to initiate lasix therapy 4) If pt has SOB, fatiuge call oncall resident page [phone] . Recommend that resident on call prescribes 80mg  lasix qd x 3 days followed by 40mg  qd until clinic appointment  2. Hypertensive Urgency: On presentation to the ED his blood pressure was 203/136. He has no symptoms of end organ damage but did have some cardiomegaly on CXR which likely indicates that he has had hypertension for sometime. We started Lisinopril 20 mg and Coreg 3.125 mg BID. We increased coreg to 6.25 BID as pt remained tachy at low dose. BP lowered 169/108 the morning of discharge and we added an additional HCTZ 12.5 mg to his regimen at discharge. We held prn antihypertensives as pt has long standing HTN and we did not want to give him hypotension that would abruptly decrease cerebral perfusion pressure.  Will max out lisinopril to 40mg  qd and increase HCTZ to 25mg  qd in light of echo results indicating systolic and diastolic heart failure as outlined above.    3. Sleep apnea:  With his wife's report of sleep disordered breathing and apnea episodes we provided CPAP while he was in the hospital. This is also  possibly contributing to his possible CHF as well as directly to his hypertension. He will need a sleep study as an outpatient to qualify for long term CPAP therapy.     4. Morbid Obesity: This is likely contributing to his blood pressure and sleep apnea. We discussed that he needs to start working on losing some weight to help prevent the complications that come with his obesity. He met with a dietician to discuss healthy eating habits and lifestyle. Should follow up in the clinic for additional support.  5. Disposition: Have scheduled f/u appt w Dr. Shirlee Latch on February 27, 2012 at 130 pm.   6. VTE: SCDs  Discharge Vitals:  BP 161/113  Pulse 93  Temp 97.8 F (36.6 C) (Oral)  Resp 16  Ht 6\' 3"  (1.905 m)  Wt 200.8 kg (442 lb 10.9 oz)  BMI 55.33 kg/m2  SpO2 95%  Discharge Labs:  Results for orders placed during the hospital encounter of 02/10/12 (from the past 24 hour(s))  GLUCOSE, CAPILLARY     Status: Abnormal   Collection Time   02/12/12  6:18 AM      Component Value Range   Glucose-Capillary 101 (*) 70 - 99 mg/dL    Signed: Bronson Curb 02/12/2012, 1:42 PM   Time Spent on Discharge: 90 min

## 2012-02-11 NOTE — Care Management Note (Signed)
    Page 1 of 1   02/11/2012     4:27:07 PM   CARE MANAGEMENT NOTE 02/11/2012  Patient:  Mark Baker, Mark Baker   Account Number:  [id number]  Date Initiated:  02/11/2012  Documentation initiated by:  Avie Arenas  Subjective/Objective Assessment:   Admitted with CP - r/o MI - Sob.  Has spouse.     Action/Plan:   Anticipated DC Date:  02/14/2012   Anticipated DC Plan:  HOME/SELF CARE      DC Planning Services  CM consult  Medication Assistance      Choice offered to / List presented to:             Status of service:  In process, will continue to follow Medicare Important Message given?   (If response is "NO", the following Medicare IM given date fields will be blank) Date Medicare IM given:   Date Additional Medicare IM given:    Discharge Disposition:  HOME/SELF CARE  Per UR Regulation:  Reviewed for med. necessity/level of care/duration of stay  If discussed at Long Length of Stay Meetings, dates discussed:    Comments:  02-11-12 4:15pm Avie Arenas, RNBSN [phone] Talked with wife and patient at bedside - Explained option for medications.  At this time on 3 four dollar meds and felt that paying 12.00 at this time is the best way at either [facility] or [employer].  Spoke with physician and is trying to set up for sleep study as OP at [facility].  Planning for CPap on discharge with Sleep study appt in 30 days. Referral made to [facility] - cannot guarantee if discharged today that respiratory therapist would be able to come out this evening and set up.  Physician updated.  Now do not want set up prior to coming home, are going to see and eval in OP clinic. awaiting echo results also.

## 2012-02-11 NOTE — Progress Notes (Signed)
  Echocardiogram 2D Echocardiogram has been performed.  Mark Baker 02/11/2012, 12:14 PM

## 2012-02-12 ENCOUNTER — Other Ambulatory Visit: Payer: Self-pay | Admitting: Internal Medicine

## 2012-02-12 DIAGNOSIS — I5043 Acute on chronic combined systolic (congestive) and diastolic (congestive) heart failure: Secondary | ICD-10-CM

## 2012-02-12 MED ORDER — FUROSEMIDE 40 MG PO TABS
40.0000 mg | ORAL_TABLET | Freq: Every day | ORAL | Status: DC
  Administered 2012-02-12: 40 mg via ORAL
  Filled 2012-02-12: qty 1

## 2012-02-12 MED ORDER — CARVEDILOL 6.25 MG PO TABS: 6.2500 mg | ORAL_TABLET | Freq: Two times a day (BID) | ORAL | Status: DC

## 2012-02-12 MED ORDER — LISINOPRIL-HYDROCHLOROTHIAZIDE 20-12.5 MG PO TABS: 1.0000 | ORAL_TABLET | Freq: Every day | ORAL | Status: DC

## 2012-02-12 MED ORDER — LISINOPRIL-HYDROCHLOROTHIAZIDE 20-12.5 MG PO TABS: 1.0000 | ORAL_TABLET | Freq: Two times a day (BID) | ORAL | Status: DC

## 2012-02-12 NOTE — Progress Notes (Signed)
Pt. Discharged 02/12/2012  1:55 PM Discharge instructions reviewed with patient/family. Patient/family verbalized understanding. All Rx's given. Questions answered as needed. Pt. Discharged to home with family/self. Mark Baker, Rocky Link, Randall An

## 2012-02-12 NOTE — Progress Notes (Signed)
Subjective: Pt lying in bed this morning without complaints. Eager to return home, as he has remained without chest tightness or SOB since admission. Continues to remain hypertensive, but did not receive any prn medications last night.   Denies CP, SOB, headaches, N/V, dysuria.    Objective: Vital signs in last 24 hours: Filed Vitals:   02/11/12 0400 02/11/12 1321 02/11/12 1926 02/12/12 0520  BP: 157/109 170/103 160/126 164/125  Pulse: 96 100 95 89  Temp: 97.7 F (36.5 C) 98.9 F (37.2 C) 98.6 F (37 C) 97.8 F (36.6 C)  TempSrc: Oral Oral Oral Oral  Resp: 20 19 20 16   Height:      Weight:    200.8 kg (442 lb 10.9 oz)  SpO2: 95% 100% 96% 95%   Weight change: 5.3 kg (11 lb 10.9 oz)  Intake/Output Summary (Last 24 hours) at 02/12/12 0757 Last data filed at 02/11/12 1230  Gross per 24 hour  Intake    240 ml  Output      0 ml  Net    240 ml   BP 164/125  Pulse 89  Temp 97.8 F (36.6 C) (Oral)  Resp 16  Ht 6\' 3"  (1.905 m)  Wt 200.8 kg (442 lb 10.9 oz)  BMI 55.33 kg/m2  SpO2 95%  Vitals reviewed. General: resting in bed, NAD HEENT: PERRL, EOMI, no scleral icterus Cardiac: RRR, no rubs, murmurs or gallops Pulm: clear to auscultation bilaterally, no wheezes, rales, or rhonchi Abd: soft, nontender, nondistended, BS present Ext: warm and well perfused, trace b/l LE edema Neuro: alert and oriented X3, cranial nerves II-XII grossly intact, strength and sensation to light touch equal in bilateral upper and lower extremities      Lab Results: Basic Metabolic Panel:  Lab 02/11/12 1610 02/10/12 0835  NA 143 142  K 3.9 3.9  CL 103 103  CO2 29 28  GLUCOSE 105* 111*  BUN 12 11  CREATININE 1.12 1.16  CALCIUM 9.0 9.2  MG -- --  PHOS -- --   CBC:  Lab 02/10/12 0835  WBC 7.6  NEUTROABS 5.0  HGB 14.7  HCT 45.4  MCV 86.0  PLT 272   Cardiac Enzymes:  Lab 02/11/12 0313 02/10/12 2146 02/10/12 1528  CKTOTAL 260* 298* 313*  CKMB 3.9 4.2* 4.4*  CKMBINDEX -- -- --    TROPONINI <0.30 <0.30 <0.30   BNP:  Lab 02/10/12 0838  PROBNP 422.3*  CBG:  Lab 02/12/12 0618 02/10/12 0835  GLUCAP 101* 101*   Hemoglobin A1C:  Lab 02/10/12 1558  HGBA1C 5.6   Fasting Lipid Panel:  Lab 02/11/12 0313  CHOL 188  HDL 35*  LDLCALC 117*  TRIG 180*  CHOLHDL 5.4  LDLDIRECT --   Thyroid Function Tests:  Lab 02/10/12 1558  TSH 0.618  T4TOTAL --  FREET4 --  T3FREE --  THYROIDAB --   Studies/Results: Dg Chest 2 View  02/10/2012  *RADIOLOGY REPORT*  Clinical Data: Shortness of breath, chest pain  CHEST - 2 VIEW  Comparison: None.  Findings: Borderline cardiomegaly.  There is mild interstitial prominence bilaterally with perihilar increased bronchial markings. The findings suspicious for mild interstitial edema.  No focal infiltrate.  IMPRESSION: Mild perihilar interstitial prominence with crowding of bronchovascular markings suspicious for interstitial edema.  No focal infiltrate.  Borderline cardiomegaly.  Original Report Authenticated By: Natasha Mead, M.D.   Medications: I have reviewed the patient's current medications. Scheduled Meds:    . aspirin EC  81 mg Oral Daily  .  carvedilol  6.25 mg Oral BID WC  . furosemide  40 mg Intravenous Daily  . lisinopril  20 mg Oral Daily  . sodium chloride  3 mL Intravenous Q12H  . sodium chloride  3 mL Intravenous Q12H   Continuous Infusions:  PRN Meds:.acetaminophen, acetaminophen, nitroGLYCERIN, ondansetron (ZOFRAN) IV, ondansetron, sodium chloride, DISCONTD: labetalol  Assessment/Plan: Assessment & Plan by Problem:  1. Chest pain: Mr. Buch presented with substernal chest pain that is at least partially worsened with activity. He had symptoms of orthopnea, trace LE edema, and elevated blood pressure.Initial concern for unstable angina. CE negative x3 and no EKG changes suggesting acute or remote ischemia. Also concern for some new onset congestive heart failure given a  family history of heart failure as well as  hypertension. Sleep apnea may also be contributing to R sided heart stress. Risk stratification shows normal TSH, lipids slightly elevated w TotChl 188, LDL 117 and HDL 35, but not fasting. A1c is normal at 5.6. - Nitro PRN  - 2D echo pending. As pt has remained asymptomatic and we suspect symptoms have improved with better control of HTN, will discharge today and call if echo demonstrates possible ischemic dysfunction requiring attention   2. Hypertensive Urgency: On presentation to the ED his blood pressure was 203/136. He has no symptoms of end organ damage other then cardiomegaly which likely indicates that he has had hypertension for sometime. He does not remember the name of the medications he was given previously but with his question of possible CHF we will initiate treatment as if he does have CHF.  Started Lisinopril 20 mg and Coreg 3.125 mg BID. Increased coreg to 6.25 BID as pt remained tachy at low dose. BP lowered significantly to 164/124 this morning but still high. Possible that cuff is too large. Suspect that hypertension and high afterload contributed to his symt -Will hold prn antihypertensives as pt has long standing HTN and we do not want to give him hypotension that will abruptly decrease cerebral perfusion pressure.  - Call MD for BP 180/110. If no sx, hold antihypertensives. If >220/120, consider low dose prn labetalol. - Will add HCTZ 12.5 qd to regimen on discharge.   3. Sleep apnea: With his wife's report of sleep disordered breathing and apnea episodes we will start him on CPAP while he is in the hospital. This is also likely contributing to his possible CHF as well as directly to his hypertension. He will need a sleep study as an outpatient to qualify for long term CPAP therapy.  -Sleep study as outpt  4. Morbid Obesity: This is likely contributing to his blood pressure and sleep apnea. We discussed that he needs to start working on losing some weight to help prevent the  complications that come with his obesity. We will have nutritional therapy come and give him information for a heart healthy diet and also a possible weight loss diet.   5. Disposition: Appt scheduled for 02/27/12 w Dr. Shirlee Latch. Will call for earlier appt if needed.  6. VTE: SCDs    LOS: 2 days   Bronson Curb 02/12/2012, 7:57 AM

## 2012-02-12 NOTE — Progress Notes (Signed)
Internal Medicine Attending  Date: 02/12/2012  Patient name: Mark Baker Medical record number: [id number] Date of birth: 1981/02/26 Age: 31 y.o. Gender: male  I saw and evaluated the patient. I reviewed the patient's interim history with Drs. Patel and [employer].  Mark Baker remains without complaints and is ready for discharge home today.  The Echo results were not available at the time of the patient visit so he was informed that he would be called with the results when they arrived.  His blood pressure remains elevated despite the lisinopril and carvedilol.  I agree with the addition of HCTZ to this regimen.  Further titration of his antihypertensives can be made in the outpatient clinic.  I agree with the plan to discharge him home today with follow-up as scheduled in the [facility].

## 2012-02-17 ENCOUNTER — Encounter: Payer: Self-pay | Admitting: Internal Medicine

## 2012-02-19 ENCOUNTER — Encounter: Payer: Self-pay | Admitting: Internal Medicine

## 2012-02-19 ENCOUNTER — Ambulatory Visit (INDEPENDENT_AMBULATORY_CARE_PROVIDER_SITE_OTHER): Payer: Self-pay | Admitting: Internal Medicine

## 2012-02-19 VITALS — BP 132/90 | HR 95 | Temp 97.6°F | Ht 75.0 in | Wt >= 6400 oz

## 2012-02-19 DIAGNOSIS — R079 Chest pain, unspecified: Secondary | ICD-10-CM

## 2012-02-19 DIAGNOSIS — G4730 Sleep apnea, unspecified: Secondary | ICD-10-CM

## 2012-02-19 DIAGNOSIS — I519 Heart disease, unspecified: Secondary | ICD-10-CM

## 2012-02-19 DIAGNOSIS — I10 Essential (primary) hypertension: Secondary | ICD-10-CM | POA: Insufficient documentation

## 2012-02-19 LAB — BASIC METABOLIC PANEL WITH GFR
Calcium: 9.3 mg/dL (ref 8.4–10.5)
GFR, Est African American: >89 mL/min
GFR, Est Non African American: 84 mL/min
Glucose, Bld: 91 mg/dL (ref 70–99)
Potassium: 4.4 mEq/L (ref 3.5–5.3)
Sodium: 140 mEq/L (ref 135–145)

## 2012-02-19 NOTE — Patient Instructions (Signed)
1. Please measure your body weight 1 to 2 times a week.  2. Please take all medications as prescribed.  3. If you have worsening of your symptoms or new symptoms arise, please call the clinic ([phone]), or go to the ER immediately if symptoms are severe.

## 2012-02-19 NOTE — Assessment & Plan Note (Signed)
Patient currently is taking 2 medications, including Coreg and Prinzide. His blood pressure is well controlled. Today blood pressure is 132/90. Will continue current regimen. Will check his BMP. Patient is going to see Rudell Cobb for orange card.

## 2012-02-19 NOTE — Assessment & Plan Note (Signed)
Patient reports that his sleep quality improved significantly after he started taking his blood pressure medications. He did not wake up in the night recently. Will not do sleep study now. Will followup.

## 2012-02-19 NOTE — Assessment & Plan Note (Signed)
In recent hospitalization, patient was found to have diffuse hypokinesis on 2-D echo with an EF of 25%. It was most likely transient abnormalities due to severely elevated blood pressure. Currently patient doesn't have any symptoms for congestive heart failure. There is no edema on physical examination. Her lung auscultation is clear. Will followup in 3 months and repeat a 2-D echo after 3 months.

## 2012-02-19 NOTE — Progress Notes (Signed)
Patient ID: Mark Baker, male   DOB: 1981/02/03, 31 y.o.   MRN: [id number]  Subjective:   Patient ID: Mark Baker male   DOB: 30-Jan-1981 31 y.o.   MRN: [id number]  HPI: Mark Baker is a 31 y.o. with a past medical history of hypertension, who presents for a hospital followup visit following his recent hospitalization.  Patient was hospitalized from 7/1 to 7/322 due to severely elevated blood pressure and chest tightness. Patient had negative workup in the hospital, including cardiac enzymes x3 and EKG for ischemia. Risk stratification workup showed TSH normal, A1c 5.6 and LDL 117. Patient had abnormal 2-D echo (EF 25% with grade 2 diastolic dysfunction and  diffuse hypokinesis).  Patient was discharged at stable condition on Coreg and Prinzide. Patient reports that he has been taking his medications regularly. Today he feels great. He does not have any complaints. He reports that his sleeping also improved significantly after he started taking his blood pressure medications. He used to wake up several times each night in the past, but he did not wake up in the night since he was discharged to home.    Past Medical History  Diagnosis Date  . Hypertension   . Asthma     childhood asthma  . Headache    Current Outpatient Prescriptions  Medication Sig Dispense Refill  . carvedilol (COREG) 6.25 MG tablet Take 1 tablet (6.25 mg total) by mouth 2 (two) times daily with a meal.  60 tablet  3  . lisinopril-hydrochlorothiazide (PRINZIDE) 20-12.5 MG per tablet Take 1 tablet by mouth 2 (two) times daily. Please take tablet in the morning.  60 tablet  3   Family History  Problem Relation Age of Onset  . Hypertension Father   . Heart failure Father   . Hypertension Sister   . Stroke Maternal Grandmother    History   Social History  . Marital Status: Married    Spouse Name: N/A    Number of Children: N/A  . Years of Education: N/A   Social History Main Topics  . Smoking status: Never Smoker    . Smokeless tobacco: Never Used  . Alcohol Use: Yes     once a month  . Drug Use: No  . Sexually Active: Yes   Other Topics Concern  . None   Social History Narrative   Lives in [place] with wife of 4 years.  Works for [employer] at [employer].  1 son and 2 daughters.     Review of Systems: General: no fevers, chills, no changes in body weight, no changes in appetite Skin: no rash HEENT: no blurry vision, hearing changes or sore throat Pulm: no dyspnea, coughing, wheezing CV: no chest pain, palpitations, shortness of breath Abd: no nausea/vomiting, abdominal pain, diarrhea/constipation GU: no dysuria, hematuria, polyuria Ext: no arthralgias, myalgias Neuro: no weakness, numbness, or tingling  Objective:  Physical Exam: Filed Vitals:   02/19/12 1023  BP: 132/90  Pulse: 95  Temp: 97.6 F (36.4 C)  TempSrc: Oral  Height: 6\' 3"  (1.905 m)  Weight: 449 lb (203.665 kg)  SpO2: 97%    General: resting in bed, not in acute distress HEENT: PERRL, EOMI, no scleral icterus Cardiac: S1/S2, RRR, No murmurs, gallops or rubs Pulm: Good air movement bilaterally, Clear to auscultation bilaterally, No rales, wheezing, rhonchi or rubs. Abd: Soft,  nondistended, nontender, no rebound pain, no organomegaly, BS present Ext: No rashes or No leg edema, 2+DP/PT pulse bilaterally Musculoskeletal: No joint deformities, erythema, or  stiffness, ROM full and no nontender Skin: no rashes. No skin bruise. Neuro: alert and oriented X3, cranial nerves II-XII grossly intact, muscle strength 5/5 in all extremeties,  sensation to light touch intact.  Psych.: patient is not psychotic, no suicidal or hemocidal ideation.   Assessment & Plan:   We'll check a BMP We asked patient to measure his body weight 1-2 times a week Was the patient to see Dr. Izell [place] orange card. We not do sleep study No referral to cardiology

## 2012-02-19 NOTE — Assessment & Plan Note (Signed)
Inpatient hospitalization, patient had negative workup including EKG for ischemia and cardiac exam X 3 times. But her 2-D echo was abnormal which showed EF of 25% with diffuse hypokinesis. It was most likely due to severely elevated blood pressure in hospital. Currently patient doesn't have any symptoms. Wil follow up patient in 3 months and repeat 2-D echo.

## 2012-02-27 ENCOUNTER — Encounter: Payer: Self-pay | Admitting: Internal Medicine

## 2012-06-10 ENCOUNTER — Other Ambulatory Visit (HOSPITAL_COMMUNITY): Payer: Self-pay | Admitting: Internal Medicine

## 2012-07-16 ENCOUNTER — Other Ambulatory Visit: Payer: Self-pay | Admitting: Internal Medicine

## 2012-07-28 ENCOUNTER — Ambulatory Visit: Payer: Self-pay | Admitting: Psychology

## 2012-09-03 ENCOUNTER — Encounter: Payer: Self-pay | Admitting: Internal Medicine

## 2012-09-03 ENCOUNTER — Ambulatory Visit (INDEPENDENT_AMBULATORY_CARE_PROVIDER_SITE_OTHER): Payer: 59 | Admitting: Internal Medicine

## 2012-09-03 VITALS — BP 140/90 | HR 90 | Temp 97.6°F | Ht 75.0 in | Wt >= 6400 oz

## 2012-09-03 DIAGNOSIS — I10 Essential (primary) hypertension: Secondary | ICD-10-CM

## 2012-09-03 DIAGNOSIS — I519 Heart disease, unspecified: Secondary | ICD-10-CM

## 2012-09-03 DIAGNOSIS — Z0000 Encounter for general adult medical examination without abnormal findings: Secondary | ICD-10-CM

## 2012-09-03 DIAGNOSIS — I509 Heart failure, unspecified: Secondary | ICD-10-CM

## 2012-09-03 LAB — BASIC METABOLIC PANEL WITH GFR
BUN: 12 mg/dL (ref 6–23)
CO2: 27 mEq/L (ref 19–32)
Chloride: 104 mEq/L (ref 96–112)
Creat: 1.04 mg/dL (ref 0.50–1.35)

## 2012-09-03 MED ORDER — CARVEDILOL 6.25 MG PO TABS: 6.2500 mg | ORAL_TABLET | Freq: Two times a day (BID) | ORAL | Status: DC

## 2012-09-03 MED ORDER — LISINOPRIL-HYDROCHLOROTHIAZIDE 20-12.5 MG PO TABS: 1.0000 | ORAL_TABLET | Freq: Every day | ORAL | Status: DC

## 2012-09-03 NOTE — Assessment & Plan Note (Signed)
BP Readings from Last 3 Encounters:  09/03/12 140/90  02/19/12 132/90  02/12/12 161/113    Lab Results  Component Value Date   NA 140 02/19/2012   K 4.4 02/19/2012   CREATININE 1.15 02/19/2012    Assessment:  Blood pressure control: controlled  Progress toward BP goal:  at goal  Comments:  Plan:  Medications:  continue current medications  Educational resources provided: brochure  Self management tools provided: home blood pressure logbook  Other plans: Blood pressure is well controlled. Will check his BMP and continue current regimen.

## 2012-09-03 NOTE — Progress Notes (Signed)
Patient ID: Mark Baker, male   DOB: 09-01-1980, 32 y.o.   MRN: [id number] Patient ID: Mark Baker, male   DOB: [birth year], 32 y.o.   MRN: [id number]  Subjective:   Patient ID: Mark Baker male   DOB: 10/10/1980 32 y.o.   MRN: [id number]  CC:  6 month follow up for HTN HPI: [name] is a 32 y.o. with a past medical history of hypertension, who presents for  followup visit following.  Patient was seen by me at clinic on 02/19/12 after discharge from the hospital. Patient was hospitalized from 7/1 to 7/322 due to severely elevated blood pressure and chest tightness. Patient had negative workup in the hospital, including cardiac enzymes x3 and EKG for ischemia. Risk stratification workup showed TSH normal, A1c 5.6 and LDL 117. Patient had abnormal 2-D echo (EF 25% with grade 2 diastolic dysfunction and  diffuse hypokinesis). Patient was discharged at stable condition on Coreg and Prinzide.   Today patient reports that he has been taking all his medications regularly. He does not have chest pain, shortness of breath, palpitation, or leg edema. He feels generally good. He lost a few pounds recently. He wants to get some medications refilled.   Past Medical History  Diagnosis Date  . Hypertension   . Asthma     childhood asthma  . Headache    Current Outpatient Prescriptions  Medication Sig Dispense Refill  . carvedilol (COREG) 6.25 MG tablet Take 1 tablet (6.25 mg total) by mouth 2 (two) times daily with a meal.  60 tablet  5  . lisinopril-hydrochlorothiazide (PRINZIDE,ZESTORETIC) 20-12.5 MG per tablet Take 1 tablet by mouth daily.  60 tablet  5   Family History  Problem Relation Age of Onset  . Hypertension Father   . Heart failure Father   . Hypertension Sister   . Stroke Maternal Grandmother    History   Social History  . Marital Status: Married    Spouse Name: N/A    Number of Children: N/A  . Years of Education: N/A   Social History Main Topics  . Smoking status: Never  Smoker   . Smokeless tobacco: Never Used  . Alcohol Use: Yes     Comment: once a month  . Drug Use: No  . Sexually Active: Yes   Other Topics Concern  . None   Social History Narrative   Lives in [place] with wife of 4 years.  Works for [employer] at [employer].  1 son and 2 daughters.     Review of Systems: General: no fevers, chills, no changes in body weight, no changes in appetite Skin: no rash HEENT: no blurry vision, hearing changes or sore throat Pulm: no dyspnea, coughing, wheezing CV: no chest pain, palpitations, shortness of breath Abd: no nausea/vomiting, abdominal pain, diarrhea/constipation GU: no dysuria, hematuria, polyuria Ext: no arthralgias, myalgias Neuro: no weakness, numbness, or tingling  Objective:  Physical Exam: Filed Vitals:   09/03/12 1051  BP: 140/90  Pulse: 90  Temp: 97.6 F (36.4 C)  TempSrc: Oral  Height: 6\' 3"  (1.905 m)  Weight: 444 lb 4.8 oz (201.533 kg)  SpO2: 97%    General: resting in bed, not in acute distress HEENT: PERRL, EOMI, no scleral icterus Cardiac: S1/S2, RRR, No murmurs, gallops or rubs Pulm: Good air movement bilaterally, Clear to auscultation bilaterally, No rales, wheezing, rhonchi or rubs. Abd: Soft,  nondistended, nontender, no rebound pain, no organomegaly, BS present Ext: No rashes or No leg edema, 2+DP/PT pulse bilaterally  Musculoskeletal: No joint deformities, erythema, or stiffness, ROM full and no nontender Skin: no rashes. No skin bruise. Neuro: alert and oriented X3, cranial nerves II-XII grossly intact, muscle strength 5/5 in all extremeties,  sensation to light touch intact.  Psych.: patient is not psychotic, no suicidal or hemocidal ideation.   Assessment & Plan:

## 2012-09-03 NOTE — Patient Instructions (Signed)
1. You have done great job in taking all your medications. I appreciate it very much. Please continue doing that. 2. Please take all medications as prescribed.  3. If you have worsening of your symptoms or new symptoms arise, please call the clinic ([phone]), or go to the ER immediately if symptoms are severe.     

## 2012-09-03 NOTE — Assessment & Plan Note (Signed)
Patient was found to have diffuse hypokinesis on 2-D echo with an EF of 25% in his previous hospitalization. It was most likely transient abnormalities due to severely elevated blood pressure. Currently patient doesn't have any symptoms for congestive heart failure. There is no edema on physical examination. Her lung auscultation is clear. Will repeat 2D echo to re-evaluate his cardiaci function.

## 2012-09-03 NOTE — Assessment & Plan Note (Signed)
-  Patient received a flu shot in December, 2013. Will document it.

## 2012-09-22 ENCOUNTER — Ambulatory Visit (HOSPITAL_COMMUNITY): Payer: 59

## 2012-09-23 ENCOUNTER — Other Ambulatory Visit (HOSPITAL_COMMUNITY): Payer: 59

## 2012-10-01 ENCOUNTER — Ambulatory Visit (HOSPITAL_COMMUNITY): Payer: 59

## 2012-10-21 ENCOUNTER — Emergency Department (HOSPITAL_COMMUNITY)
Admission: EM | Admit: 2012-10-21 | Discharge: 2012-10-21 | Disposition: A | Discharge status: Home / Self Care | Payer: 59 | Attending: Emergency Medicine | Admitting: Emergency Medicine

## 2012-10-21 ENCOUNTER — Encounter (HOSPITAL_COMMUNITY): Payer: Self-pay | Admitting: Emergency Medicine

## 2012-10-21 ENCOUNTER — Emergency Department (HOSPITAL_COMMUNITY): Payer: 59

## 2012-10-21 DIAGNOSIS — Z79899 Other long term (current) drug therapy: Secondary | ICD-10-CM | POA: Insufficient documentation

## 2012-10-21 DIAGNOSIS — R0989 Other specified symptoms and signs involving the circulatory and respiratory systems: Secondary | ICD-10-CM | POA: Insufficient documentation

## 2012-10-21 DIAGNOSIS — I10 Essential (primary) hypertension: Secondary | ICD-10-CM | POA: Insufficient documentation

## 2012-10-21 DIAGNOSIS — R079 Chest pain, unspecified: Secondary | ICD-10-CM

## 2012-10-21 DIAGNOSIS — Z8669 Personal history of other diseases of the nervous system and sense organs: Secondary | ICD-10-CM | POA: Insufficient documentation

## 2012-10-21 DIAGNOSIS — R0609 Other forms of dyspnea: Secondary | ICD-10-CM | POA: Insufficient documentation

## 2012-10-21 DIAGNOSIS — R0789 Other chest pain: Secondary | ICD-10-CM | POA: Insufficient documentation

## 2012-10-21 DIAGNOSIS — Z8679 Personal history of other diseases of the circulatory system: Secondary | ICD-10-CM | POA: Insufficient documentation

## 2012-10-21 DIAGNOSIS — J45909 Unspecified asthma, uncomplicated: Secondary | ICD-10-CM | POA: Insufficient documentation

## 2012-10-21 LAB — PRO B NATRIURETIC PEPTIDE: Pro B Natriuretic peptide (BNP): 99.2 pg/mL (ref 0–125)

## 2012-10-21 LAB — BASIC METABOLIC PANEL
CO2: 25 mEq/L (ref 19–32)
Chloride: 99 mEq/L (ref 96–112)
Creatinine, Ser: 1.17 mg/dL (ref 0.50–1.35)
GFR calc Af Amer: >90 mL/min (ref >90)
Potassium: 3.8 mEq/L (ref 3.5–5.1)

## 2012-10-21 LAB — CBC
HCT: 44.8 % (ref 39.0–52.0)
MCV: 86.8 fL (ref 78.0–100.0)
Platelets: 264 10*3/uL (ref 150–400)
RBC: 5.16 MIL/uL (ref 4.22–5.81)
RDW: 12.5 % (ref 11.5–15.5)
WBC: 9.9 10*3/uL (ref 4.0–10.5)

## 2012-10-21 LAB — POCT I-STAT TROPONIN I: Troponin i, poc: 0.01 ng/mL (ref 0.00–0.08)

## 2012-10-21 NOTE — ED Provider Notes (Signed)
History     CSN: [id number]  Arrival date & time 10/21/12  0254   First MD Initiated Contact with Patient 10/21/12 0308      Chief Complaint  Patient presents with  . Chest Pain    (Consider location/radiation/quality/duration/timing/severity/associated sxs/prior treatment) HPI The patient presents with chest pain.  Notably, the patient was in his usual state of health when the last night.  He awoke approximately 2 hours ago, telling his wife that he had stabbing chest pain and did not want to die.  The patient does not recall this.  Upon regaining his senses, he does not recall specific chest pain, nor dyspnea.  The patient's wife states that the patient appeared dyspneic.  On my exam the patient has no focal complaints. He states that he hasn't been taking all medication as directed, has no recent URI like condition, but does have mild chronic cough.  Past Medical History  Diagnosis Date  . Hypertension   . Asthma     childhood asthma  . Headache     Past Surgical History  Procedure Laterality Date  . Anterior cruciate ligament repair      Family History  Problem Relation Age of Onset  . Hypertension Father   . Heart failure Father   . Hypertension Sister   . Stroke Maternal Grandmother     History  Substance Use Topics  . Smoking status: Never Smoker   . Smokeless tobacco: Never Used  . Alcohol Use: Yes     Comment: once a month      Review of Systems  Constitutional:       Per HPI, otherwise negative  HENT:       Per HPI, otherwise negative  Respiratory:       Per HPI, otherwise negative  Cardiovascular:       Per HPI, otherwise negative  Gastrointestinal: Negative for vomiting.  Endocrine:       Negative aside from HPI  Genitourinary:       Neg aside from HPI   Musculoskeletal:       Per HPI, otherwise negative  Skin: Negative.   Neurological: Negative for syncope.    Allergies  Aspirin  Home Medications   Current Outpatient Rx  Name   Route  Sig  Dispense  Refill  . carvedilol (COREG) 6.25 MG tablet   Oral   Take 1 tablet (6.25 mg total) by mouth 2 (two) times daily with a meal.   60 tablet   5   . lisinopril-hydrochlorothiazide (PRINZIDE,ZESTORETIC) 20-12.5 MG per tablet   Oral   Take 1 tablet by mouth daily.   60 tablet   5     BP 155/107  Pulse 87  Temp(Src) 98.5 F (36.9 C) (Oral)  Resp 20  Ht 6\' 3"  (1.905 m)  Wt 435 lb (197.315 kg)  BMI 54.37 kg/m2  SpO2 95%  Physical Exam  Nursing note and vitals reviewed. Constitutional: He is oriented to person, place, and time. He appears well-developed. No distress.  HENT:  Head: Normocephalic and atraumatic.  Eyes: Conjunctivae and EOM are normal.  Cardiovascular: Normal rate and regular rhythm.   Pulmonary/Chest: Effort normal. No stridor. No respiratory distress. He has decreased breath sounds. He has no wheezes.  Abdominal: He exhibits no distension.  Musculoskeletal: He exhibits no edema.  Neurological: He is alert and oriented to person, place, and time.  Skin: Skin is warm and dry.  Psychiatric: He has a normal mood and affect.  ED Course  Procedures (including critical care time)  Labs Reviewed  BASIC METABOLIC PANEL - Abnormal; Notable for the following:    Glucose, Bld 106 (*)    GFR calc non Af Amer 81 (*)    All other components within normal limits  CBC  PRO B NATRIURETIC PEPTIDE  POCT I-STAT TROPONIN I   Dg Chest 2 View  10/21/2012  *RADIOLOGY REPORT*  Clinical Data: Chest pain  CHEST - 2 VIEW  Comparison: 02/10/2012  Findings: Cardiomegaly.  Mild central vascular congestion.  Mild interstitial prominence.  No consolidation, pleural effusion or pneumothorax.  No acute osseous finding.  IMPRESSION: Cardiomegaly with central vascular congestion. Suspect mild interstitial edema.   Original Report Authenticated By: Jearld Lesch, M.D.      No diagnosis found. Cardiac 80 sinus rhythm normal Pulse ox 99% room air normal    Date:  10/21/2012  Rate: 87  Rhythm: normal sinus rhythm and sinus arrhythmia  QRS Axis: normal  Intervals: normal  ST/T Wave abnormalities: normal  Conduction Disutrbances:none  Narrative Interpretation:   Old EKG Reviewed: none available  borderline  6:38 AM On repeat exam the patient appears calm.  No new complaints.  We discussed return precautions, the absence of notable findings on today's labs, ECG. MDM  This patient presents with after an episode of chest pain that he endorsed to his wife while awakening.  Notably, the patient had no chest pain associated became aware of himself, and on my exam has no complaints.  The patient has a history of heart failure, the absence of distress, stable vital signs, his denial of significant complaints his reassuring.  The patient's labs were similarly reassuring.  We discussed return precautions, and the patient was discharged in stable condition with primary care followup.        Gerhard Munch, MD 10/21/12 [phone]

## 2012-10-21 NOTE — ED Notes (Signed)
Pt presents with intermittent chest pain for the past 3 night described as sharp in nature.  Pt woke wife up from sleep screaming that "I don't want to die, my chest hurts"- pt doesn't recall this happening.  Pt pain free at this time.  Denies shortness of breath or a cough.  Pt on cardiac monitor, NSR on monitor- will continue to monitor pt.

## 2012-10-21 NOTE — ED Notes (Addendum)
Pt reports L sided CP X a few days-woke wife up out of sleep d/t stabbing pain in chest; pt reports feeling tired as well; denies SOB/n/v/diaphoresis; pt denies pain currently

## 2012-10-27 ENCOUNTER — Ambulatory Visit (INDEPENDENT_AMBULATORY_CARE_PROVIDER_SITE_OTHER): Payer: 59 | Admitting: Internal Medicine

## 2012-10-27 ENCOUNTER — Encounter: Payer: Self-pay | Admitting: Internal Medicine

## 2012-10-27 VITALS — BP 161/88 | HR 86 | Temp 97.6°F | Ht 75.0 in | Wt >= 6400 oz

## 2012-10-27 DIAGNOSIS — I509 Heart failure, unspecified: Secondary | ICD-10-CM

## 2012-10-27 DIAGNOSIS — G4733 Obstructive sleep apnea (adult) (pediatric): Secondary | ICD-10-CM

## 2012-10-27 DIAGNOSIS — R079 Chest pain, unspecified: Secondary | ICD-10-CM

## 2012-10-27 DIAGNOSIS — G4730 Sleep apnea, unspecified: Secondary | ICD-10-CM

## 2012-10-27 DIAGNOSIS — I519 Heart disease, unspecified: Secondary | ICD-10-CM

## 2012-10-27 DIAGNOSIS — G4700 Insomnia, unspecified: Secondary | ICD-10-CM

## 2012-10-27 MED ORDER — CARVEDILOL 12.5 MG PO TABS: 25.0000 mg | ORAL_TABLET | Freq: Two times a day (BID) | ORAL | Status: DC

## 2012-10-27 MED ORDER — NITROGLYCERIN 0.4 MG SL SUBL: 0.4000 mg | SUBLINGUAL_TABLET | SUBLINGUAL | Status: DC | PRN

## 2012-10-27 MED ORDER — LISINOPRIL 20 MG PO TABS: 20.0000 mg | ORAL_TABLET | Freq: Every day | ORAL | Status: DC

## 2012-10-27 MED ORDER — FUROSEMIDE 20 MG PO TABS: 40.0000 mg | ORAL_TABLET | Freq: Every day | ORAL | Status: DC

## 2012-10-27 NOTE — Assessment & Plan Note (Signed)
Patient still has typical obstructive sleep apnea symptoms, including snoring a lot, waking up in the night a lot and feeling tired in the morning. -will get sleep study.

## 2012-10-27 NOTE — Progress Notes (Addendum)
Patient ID: Mark Baker, male   DOB: [birth year], 32 y.o.   MRN: [id number] Subjective:   Patient ID: Mark Baker male   DOB: [birth year] 32 y.o.   MRN: [id number]  CC:   ED followup visit for chest pain.  HPI:  [name] is a 32 y.o. man with past medical history as outlined below, who presents for a ED followup visit today.  1. Chest pain: Patient reports that he visited the ED because of chest pain on 10/21/12. He was in his usual state of health until the night of 10/21/12, when was woken up by a stabbing chest pain in the AM. He does not recall specific characteristics about his chest pain. The patient's wife states that the patient appeared dyspneic at that moment. He had negative EKG in ED. His CXR showed cardiomegaly with central vascular congestion. He did not have recent URI symptoms, but has mild chronic cough. He was discharged home after chest pain resolved. After dischargeed from ED, patient still has intermittent mild chest pain. It is located at the left chest, 2/10 in severity, sharp, non radiating. It is not aggravated or alleviated by any known factors. It happens occasionally with no fixed pattern. He could not tell whether it is exertional or pleuritic.  It usually resolves after having a short rest.   2. CHF:  Patient was hospitalized from 02/10/12 to 02/12/12 due to severely elevated blood pressure and chest tightness. Patient had negative workup in the hospital, including cardiac enzymes x3 and EKG for ischemia. Risk stratification workup showed TSH normal, A1c 5.6 and LDL 117. Patient had abnormal 2-D echo (EF 25% with grade 2 diastolic dysfunction and  diffuse hypokinesis). Patient was discharged at stable condition on Coreg and Prinzide. Today patient reports that he has been taking all his medications regularly. He does not have leg edema. He missed his appointment for repeating 2-D echo recently.  3. HTN:  bp is 161/86 mmHg. On Coreg 12.5 mg daily, and Prinzide 20-12.5 mg  daily.  4. OSA: Patient reports that he snores a lot in the night. Per his wife, patient stops breathing several times each night. He feels tight in the in the morning.     Past Medical History  Diagnosis Date  . Hypertension   . Asthma     childhood asthma  . Headache    Current Outpatient Prescriptions  Medication Sig Dispense Refill  . carvedilol (COREG) 6.25 MG tablet Take 12.5 mg by mouth daily.      Marland Kitchen lisinopril-hydrochlorothiazide (PRINZIDE,ZESTORETIC) 20-12.5 MG per tablet Take 1 tablet by mouth 2 (two) times daily.       No current facility-administered medications for this visit.   Family History  Problem Relation Age of Onset  . Hypertension Father   . Heart failure Father   . Hypertension Sister   . Stroke Maternal Grandmother    History   Social History  . Marital Status: Married    Spouse Name: N/A    Number of Children: N/A  . Years of Education: N/A   Social History Main Topics  . Smoking status: Never Smoker   . Smokeless tobacco: Never Used  . Alcohol Use: Yes     Comment: once a month  . Drug Use: No  . Sexually Active: Yes   Other Topics Concern  . None   Social History Narrative   Lives in [place] with wife of 33 years.  Works for [employer] at [employer].  1 son and 2  daughters.      Review of Systems: as per HPI  Objective:  Physical Exam: Filed Vitals:   10/27/12 1407  BP: 161/88  Pulse: 86  Temp: 97.6 F (36.4 C)  TempSrc: Oral  Height: 6\' 3"  (1.905 m)  Weight: 438 lb 8 oz (198.902 kg)  SpO2: 97%   General: resting in bed, not in acute distress HEENT: PERRL, EOMI, no scleral icterus Cardiac: S1/S2, RRR, No murmurs, gallops or rubs Pulm: Good air movement bilaterally, Clear to auscultation bilaterally, No rales, wheezing, rhonchi or rubs. Abd: Soft,  nondistended, nontender, no rebound pain, no organomegaly, BS present Ext: No rashes or No leg edema, 2+DP/PT pulse bilaterally Musculoskeletal: No joint deformities, erythema, or  stiffness, ROM full and no nontender Skin: no rashes. No skin bruise. Neuro: alert and oriented X3, cranial nerves II-XII grossly intact, muscle strength 5/5 in all extremeties,  sensation to light touch intact.   Psych.: patient is not psychotic, no suicidal or hemocidal ideation.   Assessment & Plan:   Addendum 12/07/12:  Patient's sleep study done on 11/30/12 is positive for OSA. Patient has an appointment with Dr. Lavena Bullion on 12/09/12 in clinic. I will rout his sleep study result to and ask Dr Lavena Bullion to give patient a prescription of CPAP.  Lorretta Harp, MD PGY2, Internal Medicine Teaching Service Pager: [phone]

## 2012-10-27 NOTE — Assessment & Plan Note (Addendum)
Etiology is not clear currently. It might be related to the anxiety gvien normal EKG and atypical presentation. However patient has significant congestive heart failure and obstructive sleep apnea and hypertension as risk factors for coronary artery disease, it is important to rule out any possibility of coranary artery disease.  -will start NG prn -will not give ASA since he is allergic to it. -will continue coreg -will give referral to cardiology for possible stress test.

## 2012-10-27 NOTE — Assessment & Plan Note (Addendum)
Patient seems to be doing okay clinically. He does not have leg edema. His shortness of breath is at his baseline. However his recent chest x-ray on 10/21/12 showed possible pulmonary vascular congestion. I am concerned that he may have mild fluid retention. His body weight increased by 3 pounds since 10/21/12. His blood pressure is also elevated today.   -Will switch his HCTZ to lasix today for both HTN and CHF.  - will increased his coreg from 12.5 mg daily to bid. - will continue lisinopril -will give him referral to cardiology for both chest pain and CHF.

## 2012-10-27 NOTE — Patient Instructions (Signed)
1.Please stop taking Prinzide from now. 2. Please start taking lasix 40 mg daily; please start taking lisinopril 20 mg daily; please increase your Coreg to 12.5 mg twice a day. 3. Please take nitroglycerin as prescribed when you have chest pain, and call EMS immediatly. 3. If you have worsening of your symptoms or new symptoms arise, please call the clinic ([phone]), or go to the ER immediately if symptoms are severe.  Chest Pain (Nonspecific) It is often hard to give a specific diagnosis for the cause of chest pain. There is always a chance that your pain could be related to something serious, such as a heart attack or a blood clot in the lungs. You need to follow up with your caregiver for further evaluation. CAUSES   Heartburn.  Pneumonia or bronchitis.  Anxiety or stress.  Inflammation around your heart (pericarditis) or lung (pleuritis or pleurisy).  A blood clot in the lung.  A collapsed lung (pneumothorax). It can develop suddenly on its own (spontaneous pneumothorax) or from injury (trauma) to the chest.  Shingles infection (herpes zoster virus). The chest wall is composed of bones, muscles, and cartilage. Any of these can be the source of the pain.  The bones can be bruised by injury.  The muscles or cartilage can be strained by coughing or overwork.  The cartilage can be affected by inflammation and become sore (costochondritis). DIAGNOSIS  Lab tests or other studies, such as X-rays, electrocardiography, stress testing, or cardiac imaging, may be needed to find the cause of your pain.  TREATMENT   Treatment depends on what may be causing your chest pain. Treatment may include:  Acid blockers for heartburn.  Anti-inflammatory medicine.  Pain medicine for inflammatory conditions.  Antibiotics if an infection is present.  You may be advised to change lifestyle habits. This includes stopping smoking and avoiding alcohol, caffeine, and chocolate.  You may be advised  to keep your head raised (elevated) when sleeping. This reduces the chance of acid going backward from your stomach into your esophagus.  Most of the time, nonspecific chest pain will improve within 2 to 3 days with rest and mild pain medicine. HOME CARE INSTRUCTIONS   If antibiotics were prescribed, take your antibiotics as directed. Finish them even if you start to feel better.  For the next few days, avoid physical activities that bring on chest pain. Continue physical activities as directed.  Do not smoke.  Avoid drinking alcohol.  Only take over-the-counter or prescription medicine for pain, discomfort, or fever as directed by your caregiver.  Follow your caregiver's suggestions for further testing if your chest pain does not go away.  Keep any follow-up appointments you made. If you do not go to an appointment, you could develop lasting (chronic) problems with pain. If there is any problem keeping an appointment, you must call to reschedule. SEEK MEDICAL CARE IF:   You think you are having problems from the medicine you are taking. Read your medicine instructions carefully.  Your chest pain does not go away, even after treatment.  You develop a rash with blisters on your chest. SEEK IMMEDIATE MEDICAL CARE IF:   You have increased chest pain or pain that spreads to your arm, neck, jaw, back, or abdomen.  You develop shortness of breath, an increasing cough, or you are coughing up blood.  You have severe back or abdominal pain, feel nauseous, or vomit.  You develop severe weakness, fainting, or chills.  You have a fever. THIS IS AN  EMERGENCY. Do not wait to see if the pain will go away. Get medical help at once. Call your local emergency services (911 in [place] Do not drive yourself to the hospital. MAKE SURE YOU:   Understand these instructions.  Will watch your condition.  Will get help right away if you are not doing well or get worse. Document Released: 05/08/2005  Document Revised: 10/21/2011 Document Reviewed: 03/03/2008 [facility] Patient Information 2013 [place], [place].

## 2012-11-03 ENCOUNTER — Encounter: Payer: 59 | Admitting: Internal Medicine

## 2012-11-03 ENCOUNTER — Ambulatory Visit (INDEPENDENT_AMBULATORY_CARE_PROVIDER_SITE_OTHER): Payer: 59 | Admitting: Psychology

## 2012-11-03 DIAGNOSIS — F4321 Adjustment disorder with depressed mood: Secondary | ICD-10-CM

## 2012-11-08 ENCOUNTER — Encounter (HOSPITAL_COMMUNITY): Payer: Self-pay | Admitting: Cardiology

## 2012-11-08 ENCOUNTER — Observation Stay (HOSPITAL_COMMUNITY)
Admission: EM | Admit: 2012-11-08 | Discharge: 2012-11-09 | DRG: 311 | Disposition: A | Discharge status: Home / Self Care | Payer: 59 | Attending: Internal Medicine | Admitting: Internal Medicine

## 2012-11-08 ENCOUNTER — Emergency Department (HOSPITAL_COMMUNITY): Payer: 59

## 2012-11-08 DIAGNOSIS — I5043 Acute on chronic combined systolic (congestive) and diastolic (congestive) heart failure: Secondary | ICD-10-CM | POA: Diagnosis present

## 2012-11-08 DIAGNOSIS — I509 Heart failure, unspecified: Secondary | ICD-10-CM | POA: Insufficient documentation

## 2012-11-08 DIAGNOSIS — Z6841 Body Mass Index (BMI) 40.0 and over, adult: Secondary | ICD-10-CM | POA: Insufficient documentation

## 2012-11-08 DIAGNOSIS — I5040 Unspecified combined systolic (congestive) and diastolic (congestive) heart failure: Secondary | ICD-10-CM | POA: Insufficient documentation

## 2012-11-08 DIAGNOSIS — G4730 Sleep apnea, unspecified: Secondary | ICD-10-CM | POA: Diagnosis present

## 2012-11-08 DIAGNOSIS — K625 Hemorrhage of anus and rectum: Secondary | ICD-10-CM | POA: Insufficient documentation

## 2012-11-08 DIAGNOSIS — I10 Essential (primary) hypertension: Secondary | ICD-10-CM | POA: Insufficient documentation

## 2012-11-08 DIAGNOSIS — R079 Chest pain, unspecified: Principal | ICD-10-CM | POA: Insufficient documentation

## 2012-11-08 DIAGNOSIS — I519 Heart disease, unspecified: Secondary | ICD-10-CM

## 2012-11-08 DIAGNOSIS — I160 Hypertensive urgency: Secondary | ICD-10-CM

## 2012-11-08 LAB — CBC
Platelets: 231 10*3/uL (ref 150–400)
RBC: 4.91 MIL/uL (ref 4.22–5.81)
RDW: 13 % (ref 11.5–15.5)
WBC: 5.9 10*3/uL (ref 4.0–10.5)

## 2012-11-08 LAB — COMPREHENSIVE METABOLIC PANEL
ALT: 20 U/L (ref 0–53)
AST: 16 U/L (ref 0–37)
Albumin: 3.8 g/dL (ref 3.5–5.2)
Alkaline Phosphatase: 33 U/L — ABNORMAL LOW (ref 39–117)
CO2: 30 mEq/L (ref 19–32)
Chloride: 102 mEq/L (ref 96–112)
Creatinine, Ser: 1.05 mg/dL (ref 0.50–1.35)
GFR calc non Af Amer: >90 mL/min (ref >90)
Potassium: 3.7 mEq/L (ref 3.5–5.1)
Sodium: 140 mEq/L (ref 135–145)
Total Bilirubin: 1.2 mg/dL (ref 0.3–1.2)

## 2012-11-08 LAB — URINALYSIS, ROUTINE W REFLEX MICROSCOPIC
Bilirubin Urine: NEGATIVE
Glucose, UA: NEGATIVE mg/dL
Hgb urine dipstick: NEGATIVE
Ketones, ur: NEGATIVE mg/dL
Leukocytes, UA: NEGATIVE
Nitrite: NEGATIVE
Protein, ur: NEGATIVE mg/dL
Specific Gravity, Urine: 1.024 (ref 1.005–1.030)
Urobilinogen, UA: 1 mg/dL (ref 0.0–1.0)
pH: 6 (ref 5.0–8.0)

## 2012-11-08 LAB — RAPID URINE DRUG SCREEN, HOSP PERFORMED
Amphetamines: NOT DETECTED
Benzodiazepines: NOT DETECTED
Opiates: POSITIVE — AB
Tetrahydrocannabinol: NOT DETECTED

## 2012-11-08 LAB — PROTIME-INR: INR: 1.02 (ref 0.00–1.49)

## 2012-11-08 LAB — POCT I-STAT TROPONIN I

## 2012-11-08 MED ORDER — SODIUM CHLORIDE 0.9 % IJ SOLN: 3.0000 mL | Freq: Two times a day (BID) | INTRAMUSCULAR | Status: DC

## 2012-11-08 MED ORDER — ACETAMINOPHEN 325 MG PO TABS: 650.0000 mg | ORAL_TABLET | Freq: Four times a day (QID) | ORAL | Status: DC | PRN

## 2012-11-08 MED ORDER — MORPHINE SULFATE 4 MG/ML IJ SOLN
4.0000 mg | Freq: Once | INTRAMUSCULAR | Status: AC
  Administered 2012-11-08: 4 mg via INTRAVENOUS
  Filled 2012-11-08: qty 1

## 2012-11-08 MED ORDER — ONDANSETRON HCL 4 MG/2ML IJ SOLN: 4.0000 mg | INTRAMUSCULAR | Status: DC | PRN

## 2012-11-08 MED ORDER — FUROSEMIDE 40 MG PO TABS: 40.0000 mg | ORAL_TABLET | Freq: Every day | ORAL | Status: DC

## 2012-11-08 MED ORDER — LISINOPRIL 20 MG PO TABS: 20.0000 mg | ORAL_TABLET | Freq: Every day | ORAL | Status: DC

## 2012-11-08 MED ORDER — LABETALOL HCL 5 MG/ML IV SOLN
20.0000 mg | Freq: Once | INTRAVENOUS | Status: AC
  Administered 2012-11-08: 20 mg via INTRAVENOUS
  Filled 2012-11-08: qty 4

## 2012-11-08 MED ORDER — ALUM & MAG HYDROXIDE-SIMETH 200-200-20 MG/5ML PO SUSP: 30.0000 mL | Freq: Four times a day (QID) | ORAL | Status: DC | PRN

## 2012-11-08 MED ORDER — NITROGLYCERIN 0.4 MG SL SUBL: 0.4000 mg | SUBLINGUAL_TABLET | SUBLINGUAL | Status: DC | PRN

## 2012-11-08 MED ORDER — SODIUM CHLORIDE 0.9 % IJ SOLN: 3.0000 mL | INTRAMUSCULAR | Status: DC | PRN

## 2012-11-08 MED ORDER — ACETAMINOPHEN 650 MG RE SUPP: 650.0000 mg | Freq: Four times a day (QID) | RECTAL | Status: DC | PRN

## 2012-11-08 MED ORDER — CARVEDILOL 12.5 MG PO TABS
12.5000 mg | ORAL_TABLET | Freq: Two times a day (BID) | ORAL | Status: DC
  Filled 2012-11-08: qty 1

## 2012-11-08 MED ORDER — SODIUM CHLORIDE 0.9 % IV SOLN
1000.0000 mL | INTRAVENOUS | Status: DC
Start: 2012-11-08 — End: 2012-11-08
  Administered 2012-11-08: 1000 mL via INTRAVENOUS

## 2012-11-08 MED ORDER — SODIUM CHLORIDE 0.9 % IV SOLN: INTRAVENOUS | Status: DC

## 2012-11-08 MED ORDER — ONDANSETRON HCL 4 MG/2ML IJ SOLN: 4.0000 mg | Freq: Four times a day (QID) | INTRAMUSCULAR | Status: DC | PRN

## 2012-11-08 MED ORDER — CARVEDILOL 25 MG PO TABS
25.0000 mg | ORAL_TABLET | Freq: Two times a day (BID) | ORAL | Status: DC
  Filled 2012-11-08 (×3): qty 1

## 2012-11-08 MED ORDER — MORPHINE SULFATE 2 MG/ML IJ SOLN: 2.0000 mg | INTRAMUSCULAR | Status: DC | PRN

## 2012-11-08 MED ORDER — MORPHINE SULFATE 4 MG/ML IJ SOLN: 4.0000 mg | INTRAMUSCULAR | Status: DC | PRN

## 2012-11-08 MED ORDER — SODIUM CHLORIDE 0.9 % IV SOLN: 250.0000 mL | INTRAVENOUS | Status: DC | PRN

## 2012-11-08 MED ORDER — HEPARIN SODIUM (PORCINE) 5000 UNIT/ML IJ SOLN
5000.0000 [IU] | Freq: Three times a day (TID) | INTRAMUSCULAR | Status: DC
  Administered 2012-11-08: 5000 [IU] via SUBCUTANEOUS
  Filled 2012-11-08 (×2): qty 1

## 2012-11-08 MED ORDER — ONDANSETRON HCL 4 MG PO TABS: 4.0000 mg | ORAL_TABLET | Freq: Four times a day (QID) | ORAL | Status: DC | PRN

## 2012-11-08 NOTE — H&P (Signed)
Hospital Admission Note Date: 11/08/2012  Patient name: Mark Baker Medical record number: [id number] Date of birth: 01/13/1981 Age: 32 y.o. Gender: male PCP: Lorretta Harp, MD  Medical Service: Internal Medicine  Attending physician: Dr. Criselda Peaches    1st Contact:McTyre    Pager:[phone] 2nd Contact: Kollar     Pager:[phone] After 5 pm or weekends: 1st Contact:      Pager: [phone] 2nd Contact:      Pager: [phone]  Chief Complaint: chest pain   History of Present Illness: 32 year old gentleman with past medical history significant for morbid obesity, HTN, sleep apnea, congestive heart failure with ejection fraction of 25-30% per echo in July 2013 presents to ED for chest pain.  Patient reports that he started having sudden onset chest pain on the morning of his admission when he was cleaning the house and was playing with his kids. His chest pain was located on the left side of the chest, radiated to the mid chest and then right side , lasted for couple of hours,  rated his pain 7/10 on its worse, describes his pain is sharp/knife - like,  with no aggravating or relieving factors. He was given nitroglycerin in the EMS  that eased off his pain to 5/10. His pain resolved with morphine in the ED. He had another transient episode of chest pain that lasted for 30 minutes in the ED that just got better by itself. He was completely chest pain-free when we examined him. He denies any diaphoresis, SOB, nausea or vomiting associated with his chest pain.   Of note he has also been seen in the ED, on 3/12 for similar intensity chest pain and was subsequently seen in the clinic on 10/28/11 when the outpatient cardiology referral was placed , still pending an appointment date.     Meds: Current Facility-Administered Medications  Medication Dose Route Frequency Provider Last Rate Last Dose  . 0.9 %  sodium chloride infusion  1,000 mL Intravenous Continuous Carleene Cooper III, MD 125 mL/hr at 11/08/12 1647  1,000 mL at 11/08/12 1647  . 0.9 %  sodium chloride infusion  250 mL Intravenous PRN Elyse Jarvis, MD      . acetaminophen (TYLENOL) tablet 650 mg  650 mg Oral Q6H PRN Elyse Jarvis, MD       Or  . acetaminophen (TYLENOL) suppository 650 mg  650 mg Rectal Q6H PRN Elyse Jarvis, MD      . alum & mag hydroxide-simeth (MAALOX/MYLANTA) 200-200-20 MG/5ML suspension 30 mL  30 mL Oral Q6H PRN Elyse Jarvis, MD      . Melene Muller ON 11/09/2012] carvedilol (COREG) tablet 25 mg  25 mg Oral BID WC Elyse Jarvis, MD      . furosemide (LASIX) tablet 40 mg  40 mg Oral Daily Elyse Jarvis, MD      . heparin injection 5,000 Units  5,000 Units Subcutaneous Q8H Elyse Jarvis, MD      . lisinopril (PRINIVIL,ZESTRIL) tablet 20 mg  20 mg Oral Daily Elyse Jarvis, MD      . morphine 2 MG/ML injection 2 mg  2 mg Intravenous Q4H PRN Elyse Jarvis, MD      . nitroGLYCERIN (NITROSTAT) SL tablet 0.4 mg  0.4 mg Sublingual Q5 min PRN Elyse Jarvis, MD      . ondansetron (ZOFRAN) tablet 4 mg  4 mg Oral Q6H PRN Elyse Jarvis, MD       Or  . ondansetron (ZOFRAN) injection 4 mg  4 mg Intravenous Q6H PRN Elyse Jarvis,  MD      . sodium chloride 0.9 % injection 3 mL  3 mL Intravenous Q12H Elyse Jarvis, MD      . sodium chloride 0.9 % injection 3 mL  3 mL Intravenous Q12H Elyse Jarvis, MD      . sodium chloride 0.9 % injection 3 mL  3 mL Intravenous PRN Elyse Jarvis, MD       Allergies: Allergies as of 11/08/2012 - Review Complete 11/08/2012  Allergen Reaction Noted  . Aspirin Hives 10/21/2012   Past Medical History  Diagnosis Date  . Hypertension   . Asthma     childhood asthma  . Headache    Past Surgical History  Procedure Laterality Date  . Anterior cruciate ligament repair     Family History  Problem Relation Age of Onset  . Hypertension Father   . Heart failure Father   . Hypertension Sister   . Stroke Maternal Grandmother    History   Social History  . Marital Status: Married    Spouse Name: N/A     Number of Children: N/A  . Years of Education: N/A   Occupational History  . Not on file.   Social History Main Topics  . Smoking status: Never Smoker   . Smokeless tobacco: Never Used  . Alcohol Use: Yes     Comment: once a month  . Drug Use: No  . Sexually Active: Yes   Other Topics Concern  . Not on file   Social History Narrative   Lives in [place] with wife of 4 years.  Works for [employer] at [employer] [facility] [employer].  1 son and 2 daughters.      Review of Systems: Constitutional: negative Eyes: negative for icterus, irritation and redness Ears, nose, mouth, throat, and face: negative for ear drainage, epistaxis and nasal congestion Cardiovascular: negative for fatigue and irregular heart beat. Positive for chest pain Gastrointestinal: negative for abdominal pain, constipation, diarrhea, dyspepsia and vomiting Musculoskeletal:negative for arthralgias, muscle weakness, myalgias and neck pain Neurological: negative for dizziness, gait problems, headaches, memory problems, seizures, speech problems and weakness  Physical Exam: Blood pressure 156/107, pulse 61, temperature 98.4 F (36.9 C), temperature source Oral, resp. rate 18, height 5\' 8"  (1.727 m), weight 433 lb (196.408 kg), SpO2 94.00%. BP 156/107  Pulse 61  Temp(Src) 98.4 F (36.9 C) (Oral)  Resp 18  Ht 5\' 8"  (1.727 m)  Wt 433 lb (196.408 kg)  BMI 65.85 kg/m2  SpO2 94%  General Appearance:    Alert, cooperative, no distress, appears stated age  Head:    Normocephalic, without obvious abnormality, atraumatic  Eyes:    PERRL, conjunctiva/corneas clear, EOM's intact, fundi    benign, both eyes       Ears:    Normal TM's and external ear canals, both ears  Nose:   Nares normal, septum midline, mucosa normal, no drainage    or sinus tenderness  Throat:   Lips, mucosa, and tongue normal; teeth and gums normal  Neck:   Supple, symmetrical, trachea midline, no adenopathy;       thyroid:  No enlargement/tenderness/nodules; no carotid    bruit or JVD  Back:     Symmetric, no curvature, ROM normal, no CVA tenderness  Lungs:     Clear to auscultation bilaterally, respirations unlabored  Chest wall:    Tender to palpation in the right side of his chest , pain reproducible on palpation  Heart:    Regular rate and rhythm, S1 and S2  normal, no murmur, rub   or gallop  Abdomen:     Soft, non-tender, bowel sounds active all four quadrants,    no masses, no organomegaly  Genitalia:    Normal male without lesion, discharge or tenderness  Rectal:    Normal tone, normal prostate, no masses or tenderness;   guaiac negative stool  Extremities:   Extremities normal, atraumatic, no cyanosis. Trace pedal edema  Pulses:   2+ and symmetric all extremities  Skin:   Skin color, texture, turgor normal, no rashes or lesions  Lymph nodes:   Cervical, supraclavicular, and axillary nodes normal  Neurologic:   CNII-XII intact. Normal strength, sensation and reflexes      throughout    Lab results: Basic Metabolic Panel:  Recent Labs  03/47/42 1655  NA 140  K 3.7  CL 102  CO2 30  GLUCOSE 91  BUN 9  CREATININE 1.05  CALCIUM 9.2   Liver Function Tests:  Recent Labs  11/08/12 1655  AST 16  ALT 20  ALKPHOS 33*  BILITOT 1.2  PROT 6.8  ALBUMIN 3.8   CBC:  Recent Labs  11/08/12 1655  WBC 5.9  HGB 13.7  HCT 41.9  MCV 85.3  PLT 231   BNP:  Recent Labs  11/08/12 1654  PROBNP 241.8*   Coagulation:  Recent Labs  11/08/12 1655  LABPROT 13.3  INR 1.02    Imaging results:  Dg Chest Portable 1 View  11/08/2012  *RADIOLOGY REPORT*  Clinical Data: Stabbing chest pain today.  Shortness of breath with nausea.  PORTABLE CHEST - 1 VIEW  Comparison: 10/21/2012 and 02/10/2012.  Findings: 1654 hours.  There are lower lung volumes.  Cardiomegaly and vascular congestion are again noted.  There is no overt pulmonary edema or confluent airspace opacity.  There is no pneumothorax or significant pleural effusion.  Telemetry leads  overlie the chest.  IMPRESSION: Cardiomegaly and vascular congestion, similar to the prior examination.  No new findings.   Original Report Authenticated By: Carey Bullocks, M.D.     Other results: EKG: normal EKG, normal sinus rhythm, normal axis, HR - 80 bpm,  unchanged from previous tracings from 10/21/12.   Assessment & Plan by Problem: Principal Problem:   Chest pain Active Problems:   Sleep apnea   Morbid obesity   Left ventricular diastolic dysfunction   HTN (hypertension)  83 -year-old gentleman with past medical history significant for hypertension, morbid obesity, CHF with EF of 25-30% and diastolic dysfunction presents with off and on chest pain for last couple of weeks. He was seen in the ED on 3/12, and the clinic on 3/18 and again in the ED for similar character and intensity of chest pain.   # Chest pain: Patient presents with left-sided chest pain with some radiation to the right side. His blood pressure was elevated to 183/118 when he first presented to ER and was given a dose of labetalol . On exam he was mildly tender to palpation on the right side of the chest .His point-of-care troponins have been negative and EKG is normal sinus rhythm, with no acute ST- T wave changes. His CXR showed cardiomegaly and vascular congestion that was unchanged from his CXR from 3/12. Differentials for his chest pain include pain from atrial stretching given his severe CHF versus musculoskeletal versus gastritis. Given his risk factors including morbid obesity, hypertension- would admit him to rule out ACS. He may benefit from stress test.  -Admit to telemetry bed. -Cycle cardiac enzymes  x3 -Repeat EKG in a.m. -PRN nitroglycerin -PRN morphine -PRN oxygen -Check UDS -Risk stratification HbA1 C and FLP.   # Cardiomyopathy: Patient was noted to have EF of 25-30% and grade 2 diastolic dysfunction per echo in July 2013 with diffuse hypokinesis. His CXR shows cardiomegaly and vascular congestion  that was unchanged from his prior CXR from 3/12. No evidence of end- organ damage at this time. Most likely etiology for his cardiomyopathy is poorly controlled hypertension but he may need cath to rule out any ischemic disease. He does not seem to be in CHF exacerbation at this time- no JVD , trace pedal edema.His weight is 433 lbs, 6 lbs down from his clinic weight - Continue home dose of Lasix. - Strict I.'s and O's - Daily weights - Continue coreg and lisinopril  - Repeat 2 D echo  # HTN: His blood pressure was moderately elevated when he first presented to the ED. He was given 20 mg of labetalol and his blood pressure is now running in the 150s to 160s systolic's and diastolics in 100's.  - Continue home meds for now.  # Probable Sleep apnea: He is scheduled for sleep study on April 21.  # DVT: heparin    Dispo: Disposition is deferred at this time, awaiting improvement of current medical problems. Anticipated discharge in approximately 1-2  day(s).   The patient does have a current PCP ([facility], Brien Few, MD), therefore is requiring [facility] follow-up after discharge.   The patient does not have transportation limitations that hinder transportation to clinic appointments.  Signed: Pearly Bartosik 11/08/2012, 9:00 PM

## 2012-11-08 NOTE — ED Notes (Signed)
Pt to department via EMS- pt reports he started having chest pain about 2 hours ago while cleaning his house. Reports increased pain with palpation and pain moves across his chest. Pt given 1 nitro but gave him a headache. Pt reports he is allergic to asa. Denies any SOB or n/v with the pain. Bp-196/112 Hr-70 Left hand 20g. Pt given 4mg  Zofran.

## 2012-11-08 NOTE — ED Provider Notes (Signed)
History     CSN: [id number]  Arrival date & time 11/08/12  1608   First MD Initiated Contact with Patient 11/08/12 1612      Chief Complaint  Patient presents with  . Chest Pain    (Consider location/radiation/quality/duration/timing/severity/associated sxs/prior treatment) HPI Comments: Patient is a man who has a history of hypertension and prior chest pain. He was cleaning his house today, and about 2 hours ago developed a severe pain in the left lateral chest wall, which she describes as a knifelike pain. He waited for an hour but it didn't go away and so he called EMS. They gave him nitroglycerin, which reduced his pain from a 7-84, but per gave a bad headache. He is allergic to aspirin. He has a history of hypertension and is on multiple medications for that.  Patient is a 32 y.o. male presenting with chest pain. The history is provided by the patient and medical records.  Chest Pain Pain location:  L chest Pain quality: sharp   Pain radiates to:  Does not radiate Pain radiates to the back: no   Pain severity:  Severe Onset quality:  Sudden Duration:  2 hours Timing:  Constant Progression:  Waxing and waning Chronicity:  New Context comment:  His pain came on while he was cleaning his house. Relieved by:  Nothing Worsened by:  Movement Ineffective treatments:  Nitroglycerin Associated symptoms: no palpitations   Risk factors: hypertension, male sex and obesity     Past Medical History  Diagnosis Date  . Hypertension   . Asthma     childhood asthma  . Headache     Past Surgical History  Procedure Laterality Date  . Anterior cruciate ligament repair      Family History  Problem Relation Age of Onset  . Hypertension Father   . Heart failure Father   . Hypertension Sister   . Stroke Maternal Grandmother     History  Substance Use Topics  . Smoking status: Never Smoker   . Smokeless tobacco: Never Used  . Alcohol Use: Yes     Comment: once a month       Review of Systems  Constitutional: Negative.   HENT: Negative.   Eyes: Negative.   Respiratory: Negative.   Cardiovascular: Positive for chest pain. Negative for palpitations and leg swelling.  Gastrointestinal: Negative.   Genitourinary: Negative.   Musculoskeletal: Negative.   Skin: Negative.   Neurological: Negative.   Psychiatric/Behavioral: Negative.     Allergies  Aspirin  Home Medications   Current Outpatient Rx  Name  Route  Sig  Dispense  Refill  . carvedilol (COREG) 12.5 MG tablet   Oral   Take 2 tablets (25 mg total) by mouth 2 (two) times daily with a meal.   60 tablet   5   . furosemide (LASIX) 20 MG tablet   Oral   Take 2 tablets (40 mg total) by mouth daily.   30 tablet   11   . lisinopril (PRINIVIL,ZESTRIL) 20 MG tablet   Oral   Take 1 tablet (20 mg total) by mouth daily.   30 tablet   5   . nitroGLYCERIN (NITROSTAT) 0.4 MG SL tablet   Sublingual   Place 1 tablet (0.4 mg total) under the tongue every 5 (five) minutes as needed for chest pain.   30 tablet   1     BP 183/118  Pulse 75  Temp(Src) 98.7 F (37.1 C)  Resp 14  SpO2 99%  Physical Exam  Nursing note and vitals reviewed. Constitutional: He is oriented to person, place, and time.  Morbidly obese man, complaining of headache following nitroglycerin. He localizes pain to the left lateral chest wall.  HENT:  Head: Normocephalic and atraumatic.  Right Ear: External ear normal.  Left Ear: External ear normal.  Mouth/Throat: Oropharynx is clear and moist.  Eyes: Conjunctivae and EOM are normal. Pupils are equal, round, and reactive to light.  Neck: Normal range of motion. Neck supple. No thyromegaly present.  Cardiovascular: Normal rate and regular rhythm.   Pulmonary/Chest: Effort normal and breath sounds normal. Respiratory distress: He has tenderness in her lateral chest wall, and winces when this area is palpated.  Abdominal: Soft. Bowel sounds are normal. There is no  tenderness.  Musculoskeletal: Normal range of motion. He exhibits no edema and no tenderness.  Neurological: He is alert and oriented to person, place, and time.  No sensory or motor deficit.  Skin: Skin is warm and dry.  Psychiatric: He has a normal mood and affect. His behavior is normal.    ED Course  Procedures (including critical care time)  4:23 PM  Date: 11/08/2012  Rate:72  Rhythm: normal sinus rhythm  QRS Axis: normal  Intervals: normal QRS:  Left ventricular hypertrophy  ST/T Wave abnormalities: normal  Conduction Disutrbances:none  Narrative Interpretation: Abnormal EKG  Old EKG Reviewed: unchanged  4:48 PM Pt seen --> physical exam performed.  Lab workup ordered.  IV morphine ordered.d  6:39 PM Results for orders placed during the hospital encounter of 11/08/12  CBC      Result Value Range   WBC 5.9  4.0 - 10.5 K/uL   RBC 4.91  4.22 - 5.81 MIL/uL   Hemoglobin 13.7  13.0 - 17.0 g/dL   HCT 16.1  09.6 - 04.5 %   MCV 85.3  78.0 - 100.0 fL   MCH 27.9  26.0 - 34.0 pg   MCHC 32.7  30.0 - 36.0 g/dL   RDW 40.9  81.1 - 91.4 %   Platelets 231  150 - 400 K/uL  COMPREHENSIVE METABOLIC PANEL      Result Value Range   Sodium 140  135 - 145 mEq/L   Potassium 3.7  3.5 - 5.1 mEq/L   Chloride 102  96 - 112 mEq/L   CO2 30  19 - 32 mEq/L   Glucose, Bld 91  70 - 99 mg/dL   BUN 9  6 - 23 mg/dL   Creatinine, Ser 7.82  0.50 - 1.35 mg/dL   Calcium 9.2  8.4 - 95.6 mg/dL   Total Protein 6.8  6.0 - 8.3 g/dL   Albumin 3.8  3.5 - 5.2 g/dL   AST 16  0 - 37 U/L   ALT 20  0 - 53 U/L   Alkaline Phosphatase 33 (*) 39 - 117 U/L   Total Bilirubin 1.2  0.3 - 1.2 mg/dL   GFR calc non Af Amer >90  >90 mL/min   GFR calc Af Amer >90  >90 mL/min  PROTIME-INR      Result Value Range   Prothrombin Time 13.3  11.6 - 15.2 seconds   INR 1.02  0.00 - 1.49  APTT      Result Value Range   aPTT 31  24 - 37 seconds  PRO B NATRIURETIC PEPTIDE      Result Value Range   Pro B Natriuretic peptide  (BNP) 241.8 (*) 0 - 125 pg/mL  POCT I-STAT TROPONIN  I      Result Value Range   Troponin i, poc 0.03  0.00 - 0.08 ng/mL   Comment 3            Dg Chest 2 View  10/21/2012  *RADIOLOGY REPORT*  Clinical Data: Chest pain  CHEST - 2 VIEW  Comparison: 02/10/2012  Findings: Cardiomegaly.  Mild central vascular congestion.  Mild interstitial prominence.  No consolidation, pleural effusion or pneumothorax.  No acute osseous finding.  IMPRESSION: Cardiomegaly with central vascular congestion. Suspect mild interstitial edema.   Original Report Authenticated By: Jearld Lesch, M.D.    Dg Chest Portable 1 View  11/08/2012  *RADIOLOGY REPORT*  Clinical Data: Stabbing chest pain today.  Shortness of breath with nausea.  PORTABLE CHEST - 1 VIEW  Comparison: 10/21/2012 and 02/10/2012.  Findings: 1654 hours.  There are lower lung volumes.  Cardiomegaly and vascular congestion are again noted.  There is no overt pulmonary edema or confluent airspace opacity.  There is no pneumothorax or significant pleural effusion.  Telemetry leads overlie the chest.  IMPRESSION: Cardiomegaly and vascular congestion, similar to the prior examination.  No new findings.   Original Report Authenticated By: Carey Bullocks, M.D.    6:44 PM Lab tests show no acute findings. He continues to complain of chest pain.  His blood pressure is persistantly high, so will give IV labetalol 20 mg.  Will call Internal Medicine Teaching Service to see him.  Plan is to admit him to a telemetry bed to Obs status, Dr. Dorrene German attending.  1. Chest pain   2. Hypertensive urgency        Carleene Cooper III, MD 11/09/12 1226

## 2012-11-09 DIAGNOSIS — I10 Essential (primary) hypertension: Secondary | ICD-10-CM

## 2012-11-09 DIAGNOSIS — R079 Chest pain, unspecified: Principal | ICD-10-CM

## 2012-11-09 DIAGNOSIS — I519 Heart disease, unspecified: Secondary | ICD-10-CM

## 2012-11-09 DIAGNOSIS — G4730 Sleep apnea, unspecified: Secondary | ICD-10-CM

## 2012-11-09 LAB — TROPONIN I: Troponin I: <0.3 ng/mL (ref <0.30)

## 2012-11-09 LAB — BASIC METABOLIC PANEL
BUN: 9 mg/dL (ref 6–23)
Calcium: 8.8 mg/dL (ref 8.4–10.5)
Chloride: 103 mEq/L (ref 96–112)
Creatinine, Ser: 1.09 mg/dL (ref 0.50–1.35)
GFR calc Af Amer: >90 mL/min (ref >90)
GFR calc non Af Amer: 88 mL/min — ABNORMAL LOW (ref >90)

## 2012-11-09 LAB — HEMOGLOBIN A1C: Mean Plasma Glucose: 108 mg/dL (ref <117)

## 2012-11-09 LAB — CBC
HCT: 41.1 % (ref 39.0–52.0)
Hemoglobin: 13.2 g/dL (ref 13.0–17.0)
WBC: 6.5 10*3/uL (ref 4.0–10.5)

## 2012-11-09 LAB — COMPREHENSIVE METABOLIC PANEL
BUN: 10 mg/dL (ref 6–23)
Calcium: 8.7 mg/dL (ref 8.4–10.5)
GFR calc Af Amer: >90 mL/min (ref >90)
Glucose, Bld: 98 mg/dL (ref 70–99)
Sodium: 139 mEq/L (ref 135–145)
Total Protein: 6.3 g/dL (ref 6.0–8.3)

## 2012-11-09 LAB — LIPID PANEL
Cholesterol: 180 mg/dL (ref 0–200)
HDL: 30 mg/dL — ABNORMAL LOW (ref >39)
Total CHOL/HDL Ratio: 6 RATIO

## 2012-11-09 MED ORDER — ONDANSETRON HCL 4 MG PO TABS: 4.0000 mg | ORAL_TABLET | Freq: Four times a day (QID) | ORAL | Status: DC | PRN

## 2012-11-09 MED ORDER — ACETAMINOPHEN 650 MG RE SUPP: 650.0000 mg | Freq: Four times a day (QID) | RECTAL | Status: DC | PRN

## 2012-11-09 MED ORDER — ALUM & MAG HYDROXIDE-SIMETH 200-200-20 MG/5ML PO SUSP: 30.0000 mL | Freq: Four times a day (QID) | ORAL | Status: DC | PRN

## 2012-11-09 MED ORDER — LISINOPRIL 20 MG PO TABS
20.0000 mg | ORAL_TABLET | Freq: Every day | ORAL | Status: DC
  Administered 2012-11-09: 20 mg via ORAL
  Filled 2012-11-09: qty 1

## 2012-11-09 MED ORDER — HEPARIN SODIUM (PORCINE) 5000 UNIT/ML IJ SOLN
5000.0000 [IU] | Freq: Three times a day (TID) | INTRAMUSCULAR | Status: DC
  Filled 2012-11-09 (×4): qty 1

## 2012-11-09 MED ORDER — HYDROCHLOROTHIAZIDE 12.5 MG PO CAPS
12.5000 mg | ORAL_CAPSULE | Freq: Every day | ORAL | Status: DC
  Administered 2012-11-09: 12.5 mg via ORAL
  Filled 2012-11-09: qty 1

## 2012-11-09 MED ORDER — FUROSEMIDE 40 MG PO TABS
40.0000 mg | ORAL_TABLET | Freq: Every day | ORAL | Status: DC
  Administered 2012-11-09: 40 mg via ORAL
  Filled 2012-11-09: qty 1

## 2012-11-09 MED ORDER — HYDROCHLOROTHIAZIDE 12.5 MG PO CAPS: 12.5000 mg | ORAL_CAPSULE | Freq: Every day | ORAL | Status: DC

## 2012-11-09 MED ORDER — ACETAMINOPHEN 325 MG PO TABS: 650.0000 mg | ORAL_TABLET | Freq: Four times a day (QID) | ORAL | Status: DC | PRN

## 2012-11-09 MED ORDER — POTASSIUM CHLORIDE CRYS ER 20 MEQ PO TBCR
40.0000 meq | EXTENDED_RELEASE_TABLET | Freq: Once | ORAL | Status: DC
  Filled 2012-11-09: qty 2

## 2012-11-09 MED ORDER — MORPHINE SULFATE 2 MG/ML IJ SOLN: 2.0000 mg | INTRAMUSCULAR | Status: DC | PRN

## 2012-11-09 MED ORDER — NITROGLYCERIN 0.4 MG SL SUBL: 0.4000 mg | SUBLINGUAL_TABLET | SUBLINGUAL | Status: DC | PRN

## 2012-11-09 MED ORDER — POTASSIUM CHLORIDE CRYS ER 20 MEQ PO TBCR
40.0000 meq | EXTENDED_RELEASE_TABLET | Freq: Once | ORAL | Status: AC
  Administered 2012-11-09: 40 meq via ORAL

## 2012-11-09 MED ORDER — CARVEDILOL 25 MG PO TABS
25.0000 mg | ORAL_TABLET | Freq: Two times a day (BID) | ORAL | Status: DC
  Administered 2012-11-09: 25 mg via ORAL
  Filled 2012-11-09 (×3): qty 1

## 2012-11-09 MED ORDER — SODIUM CHLORIDE 0.9 % IJ SOLN
3.0000 mL | Freq: Two times a day (BID) | INTRAMUSCULAR | Status: DC
  Administered 2012-11-09: 3 mL via INTRAVENOUS

## 2012-11-09 MED ORDER — ONDANSETRON HCL 4 MG/2ML IJ SOLN: 4.0000 mg | Freq: Four times a day (QID) | INTRAMUSCULAR | Status: DC | PRN

## 2012-11-09 MED ORDER — SODIUM CHLORIDE 0.9 % IV SOLN: INTRAVENOUS | Status: DC | PRN

## 2012-11-09 NOTE — Discharge Summary (Signed)
Patient Name: Mark Baker  MRN:  [id number]   DOB: Apr 15, 1981   PCP: Lorretta Harp, MD         Date of Admission: 11/08/2012  Date of Discharge: 11/09/2012        Attending Physician: Inez Catalina, MD      DISCHARGE DIAGNOSES: Chest pain Chronic mixed CHF HTN Probable Sleep apnea Blood per rectum    DISPOSITION AND FOLLOW-UP: Mark Baker is to follow-up with the listed providers as detailed below, at which time, the following should be addressed:   1. F/u on complaints of chest pain. Refer to to cardiology for further evaluation/management.   2. Labs / imaging needed: N/A 3. Pending labs/ test needing follow-up: N/A  Follow-up Information   Follow up with Elfredia Nevins, MD. (You have an appointment on 11/13/2012 (Friday) at 2:45PM. )    Contact information:   [address] [place] [address] [phone]      Discharge Orders   Future Appointments Provider Department Dept Phone   11/13/2012 2:45 PM Elfredia Nevins, MD [facility] [phone]   11/19/2012 3:30 PM Vesta Mixer, MD [facility] Main Office [place]) [phone]   11/30/2012 8:30 PM [facility] [phone]   Future Orders Complete By Expires     Diet - low sodium heart healthy  As directed     Increase activity slowly  As directed         DISCHARGE MEDICATIONS:   Medication List    TAKE these medications       carvedilol 12.5 MG tablet  Commonly known as:  COREG  Take 2 tablets (25 mg total) by mouth 2 (two) times daily with a meal.     furosemide 20 MG tablet  Commonly known as:  LASIX  Take 2 tablets (40 mg total) by mouth daily.     hydrochlorothiazide 12.5 MG capsule  Commonly known as:  MICROZIDE  Take 1 capsule (12.5 mg total) by mouth daily.     lisinopril 20 MG tablet  Commonly known as:  PRINIVIL,ZESTRIL  Take 1 tablet (20 mg total) by mouth daily.     nitroGLYCERIN 0.4  MG SL tablet  Commonly known as:  NITROSTAT  Place 1 tablet (0.4 mg total) under the tongue every 5 (five) minutes as needed for chest pain.        PROCEDURES PERFORMED:  Dg Chest 2 View  10/21/2012  *RADIOLOGY REPORT*  Clinical Data: Chest pain  CHEST - 2 VIEW  Comparison: 02/10/2012  Findings: Cardiomegaly.  Mild central vascular congestion.  Mild interstitial prominence.  No consolidation, pleural effusion or pneumothorax.  No acute osseous finding.  IMPRESSION: Cardiomegaly with central vascular congestion. Suspect mild interstitial edema.   Original Report Authenticated By: Jearld Lesch, M.D.    Dg Chest Portable 1 View  11/08/2012  *RADIOLOGY REPORT*  Clinical Data: Stabbing chest pain today.  Shortness of breath with nausea.  PORTABLE CHEST - 1 VIEW  Comparison: 10/21/2012 and 02/10/2012.  Findings: 1654 hours.  There are lower lung volumes.  Cardiomegaly and vascular congestion are again noted.  There is no overt pulmonary edema or confluent airspace opacity.  There is no pneumothorax or significant pleural effusion.  Telemetry leads overlie the chest.  IMPRESSION: Cardiomegaly and vascular congestion, similar to the prior examination.  No new findings.   Original Report Authenticated By: Carey Bullocks, M.D.  ADMISSION DATA: H&P: 32 year old gentleman with past medical history significant for morbid obesity, HTN, sleep apnea, congestive heart failure with ejection fraction of 25-30% per echo in July 2013 presents to ED for chest pain.  Patient reports that he started having sudden onset chest pain on the morning of his admission when he was cleaning the house and was playing with his kids. His chest pain was located on the left side of the chest, radiated to the mid chest and then right side , lasted for couple of hours, rated his pain 7/10 on its worse, describes his pain is sharp/knife - like, with no aggravating or relieving factors. He was given nitroglycerin in the EMS that  eased off his pain to 5/10. His pain resolved with morphine in the ED. He had another transient episode of chest pain that lasted for 30 minutes in the ED that just got better by itself. He was completely chest pain-free when we examined him. He denies any diaphoresis, SOB, nausea or vomiting associated with his chest pain.  Of note he has also been seen in the ED, on 3/12 for similar intensity chest pain and was subsequently seen in the clinic on 10/28/11 when the outpatient cardiology referral was placed , still pending an appointment date.    Physical Exam: Blood pressure 156/107, pulse 61, temperature 98.4 F (36.9 C), temperature source Oral, resp. rate 18, height 5\' 8"  (1.727 m), weight 433 lb (196.408 kg), SpO2 94.00%.  BP 156/107  Pulse 61  Temp(Src) 98.4 F (36.9 C) (Oral)  Resp 18  Ht 5\' 8"  (1.727 m)  Wt 433 lb (196.408 kg)  BMI 65.85 kg/m2  SpO2 94%  General Appearance:  Alert, cooperative, no distress, appears stated age   Head:  Normocephalic, without obvious abnormality, atraumatic   Eyes:  PERRL, conjunctiva/corneas clear, EOM's intact, fundi  benign, both eyes   Ears:  Normal TM's and external ear canals, both ears   Nose:  Nares normal, septum midline, mucosa normal, no drainage or sinus tenderness   Throat:  Lips, mucosa, and tongue normal; teeth and gums normal   Neck:  Supple, symmetrical, trachea midline, no adenopathy;  thyroid: No enlargement/tenderness/nodules; no carotid  bruit or JVD   Back:  Symmetric, no curvature, ROM normal, no CVA tenderness   Lungs:  Clear to auscultation bilaterally, respirations unlabored   Chest wall:  Tender to palpation in the right side of his chest , pain reproducible on palpation   Heart:  Regular rate and rhythm, S1 and S2 normal, no murmur, rub or gallop   Abdomen:  Soft, non-tender, bowel sounds active all four quadrants,  no masses, no organomegaly   Genitalia:  Normal male without lesion, discharge or tenderness   Rectal:   Normal tone, normal prostate, no masses or tenderness;  guaiac negative stool   Extremities:  Extremities normal, atraumatic, no cyanosis. Trace pedal edema   Pulses:  2+ and symmetric all extremities   Skin:  Skin color, texture, turgor normal, no rashes or lesions   Lymph nodes:  Cervical, supraclavicular, and axillary nodes normal   Neurologic:  CNII-XII intact. Normal strength, sensation and reflexes  throughout      Labs: Basic Metabolic Panel:   Recent Labs   11/08/12 1655   NA  140   K  3.7   CL  102   CO2  30   GLUCOSE  91   BUN  9   CREATININE  1.05   CALCIUM  9.2  Liver Function Tests:   Recent Labs   11/08/12 1655   AST  16   ALT  20   ALKPHOS  33*   BILITOT  1.2   PROT  6.8   ALBUMIN  3.8    CBC:   Recent Labs   11/08/12 1655   WBC  5.9   HGB  13.7   HCT  41.9   MCV  85.3   PLT  231    BNP:   Recent Labs   11/08/12 1654   PROBNP  241.8*    Coagulation:   Recent Labs   11/08/12 1655   LABPROT  13.3   INR  1.02      HOSPITAL COURSE: Chest pain - Patient presented with left-sided chest pain with some radiation to the right side. His blood pressure was elevated to 183/118 when he first presented to ER and was given a dose of labetalol with symptomatic improvement. On exam he was mildly tender to palpation on the right side of the chest. CXR revealed cardiomegaly and vascular congestion that was unchanged from his CXR from 3/12. ProBNP was wnl (assuming cutoff of 450pg/mL). EKG performed on admission was unchanged from baseline (TWI in inferior leads), as was repeat EKG performed the morning of discharge. Troponin was negative x 3. Given the patient's elevations in blood pressure and known CHF, the patient seems to have likely been experiencing coronary demand ischemia which would best explain his left-sided chest pain given negative workup for ACS. On the morning of discharge, he was at his baseline and had no ongoing complaints of chest  pain. He will [facility]up in the [facility], at which time he should be referred to cardiology for further evaluation and management.   Chronic mixed CHF - Patient was noted to have EF of 25-30% and grade 2 diastolic dysfunction per echo in July 2013 with diffuse hypokinesis. No evidence of exacerbation--CXR reveals cardiomegaly and vascular congestion that was unchanged from his prior CXR from 3/12, and proBNP was wnl on admission (assuming cutoff of 450pg/mL). Per above, he should be referred to cardiology at time of hospital follow-up.   HTN - Elevated on admission, and likely the cause of the patient's chest pain in the setting of known CHF. Patient had recently had HCTZ replaced with lasix, however since his BP appears to not be fully controlled on his current regimen, HCTZ will be reinstituted at 12.5mg  QD at discharge. His lasix, lisinopril, and coreg were continued at discharge as well.   Probable sleep apnea - He is scheduled for nocturnal polysomnography on April 21.  Blood per rectum - complaints most consistent with hemorrhoidal bleeding. No evidence of significant blood loss during hospital course.   DISCHARGE DATA: Vital Signs: BP 159/105  Pulse 74  Temp(Src) 97.2 F (36.2 C) (Oral)  Resp 19  Ht 5\' 8"  (1.727 m)  Wt 433 lb (196.408 kg)  BMI 65.85 kg/m2  SpO2 97%  Labs: Results for orders placed during the hospital encounter of 11/08/12 (from the past 24 hour(s))  PRO B NATRIURETIC PEPTIDE     Status: Abnormal   Collection Time    11/08/12  4:54 PM      Result Value Range   Pro B Natriuretic peptide (BNP) 241.8 (*) 0 - 125 pg/mL  CBC     Status: None   Collection Time    11/08/12  4:55 PM      Result Value Range   WBC 5.9  4.0 - 10.5  K/uL   RBC 4.91  4.22 - 5.81 MIL/uL   Hemoglobin 13.7  13.0 - 17.0 g/dL   HCT 16.1  09.6 - 04.5 %   MCV 85.3  78.0 - 100.0 fL   MCH 27.9  26.0 - 34.0 pg   MCHC 32.7  30.0 - 36.0 g/dL   RDW 40.9  81.1 - 91.4 %   Platelets 231  150 - 400 K/uL    COMPREHENSIVE METABOLIC PANEL     Status: Abnormal   Collection Time    11/08/12  4:55 PM      Result Value Range   Sodium 140  135 - 145 mEq/L   Potassium 3.7  3.5 - 5.1 mEq/L   Chloride 102  96 - 112 mEq/L   CO2 30  19 - 32 mEq/L   Glucose, Bld 91  70 - 99 mg/dL   BUN 9  6 - 23 mg/dL   Creatinine, Ser 7.82  0.50 - 1.35 mg/dL   Calcium 9.2  8.4 - 95.6 mg/dL   Total Protein 6.8  6.0 - 8.3 g/dL   Albumin 3.8  3.5 - 5.2 g/dL   AST 16  0 - 37 U/L   ALT 20  0 - 53 U/L   Alkaline Phosphatase 33 (*) 39 - 117 U/L   Total Bilirubin 1.2  0.3 - 1.2 mg/dL   GFR calc non Af Amer >90  >90 mL/min   GFR calc Af Amer >90  >90 mL/min  PROTIME-INR     Status: None   Collection Time    11/08/12  4:55 PM      Result Value Range   Prothrombin Time 13.3  11.6 - 15.2 seconds   INR 1.02  0.00 - 1.49  APTT     Status: None   Collection Time    11/08/12  4:55 PM      Result Value Range   aPTT 31  24 - 37 seconds  POCT I-STAT TROPONIN I     Status: None   Collection Time    11/08/12  5:10 PM      Result Value Range   Troponin i, poc 0.03  0.00 - 0.08 ng/mL   Comment 3           POCT I-STAT TROPONIN I     Status: None   Collection Time    11/08/12  7:28 PM      Result Value Range   Troponin i, poc 0.02  0.00 - 0.08 ng/mL   Comment 3           TROPONIN I     Status: None   Collection Time    11/08/12  8:50 PM      Result Value Range   Troponin I <0.30  <0.30 ng/mL  HEMOGLOBIN A1C     Status: None   Collection Time    11/08/12  8:50 PM      Result Value Range   Hemoglobin A1C 5.4  <5.7 %   Mean Plasma Glucose 108  <117 mg/dL  URINALYSIS, ROUTINE W REFLEX MICROSCOPIC     Status: None   Collection Time    11/08/12  8:55 PM      Result Value Range   Color, Urine YELLOW  YELLOW   APPearance CLEAR  CLEAR   Specific Gravity, Urine 1.024  1.005 - 1.030   pH 6.0  5.0 - 8.0   Glucose, UA NEGATIVE  NEGATIVE mg/dL   Hgb urine  dipstick NEGATIVE  NEGATIVE   Bilirubin Urine NEGATIVE  NEGATIVE    Ketones, ur NEGATIVE  NEGATIVE mg/dL   Protein, ur NEGATIVE  NEGATIVE mg/dL   Urobilinogen, UA 1.0  0.0 - 1.0 mg/dL   Nitrite NEGATIVE  NEGATIVE   Leukocytes, UA NEGATIVE  NEGATIVE  URINE RAPID DRUG SCREEN (HOSP PERFORMED)     Status: Abnormal   Collection Time    11/08/12  8:56 PM      Result Value Range   Opiates POSITIVE (*) NONE DETECTED   Cocaine NONE DETECTED  NONE DETECTED   Benzodiazepines NONE DETECTED  NONE DETECTED   Amphetamines NONE DETECTED  NONE DETECTED   Tetrahydrocannabinol NONE DETECTED  NONE DETECTED   Barbiturates NONE DETECTED  NONE DETECTED  TROPONIN I     Status: None   Collection Time    11/09/12  2:30 AM      Result Value Range   Troponin I <0.30  <0.30 ng/mL  COMPREHENSIVE METABOLIC PANEL     Status: Abnormal   Collection Time    11/09/12  2:30 AM      Result Value Range   Sodium 139  135 - 145 mEq/L   Potassium 3.4 (*) 3.5 - 5.1 mEq/L   Chloride 101  96 - 112 mEq/L   CO2 30  19 - 32 mEq/L   Glucose, Bld 98  70 - 99 mg/dL   BUN 10  6 - 23 mg/dL   Creatinine, Ser 1.61  0.50 - 1.35 mg/dL   Calcium 8.7  8.4 - 09.6 mg/dL   Total Protein 6.3  6.0 - 8.3 g/dL   Albumin 3.5  3.5 - 5.2 g/dL   AST 14  0 - 37 U/L   ALT 17  0 - 53 U/L   Alkaline Phosphatase 31 (*) 39 - 117 U/L   Total Bilirubin 1.4 (*) 0.3 - 1.2 mg/dL   GFR calc non Af Amer 86 (*) >90 mL/min   GFR calc Af Amer >90  >90 mL/min  CBC     Status: None   Collection Time    11/09/12  2:30 AM      Result Value Range   WBC 6.5  4.0 - 10.5 K/uL   RBC 4.76  4.22 - 5.81 MIL/uL   Hemoglobin 13.2  13.0 - 17.0 g/dL   HCT 04.5  40.9 - 81.1 %   MCV 86.3  78.0 - 100.0 fL   MCH 27.7  26.0 - 34.0 pg   MCHC 32.1  30.0 - 36.0 g/dL   RDW 91.4  78.2 - 95.6 %   Platelets 213  150 - 400 K/uL  LIPID PANEL     Status: Abnormal   Collection Time    11/09/12  2:30 AM      Result Value Range   Cholesterol 180  0 - 200 mg/dL   Triglycerides 213 (*) <150 mg/dL   HDL 30 (*) >08 mg/dL   Total CHOL/HDL  Ratio 6.0     VLDL 41 (*) 0 - 40 mg/dL   LDL Cholesterol 657 (*) 0 - 99 mg/dL     Time spent on discharge: 35 minutes  Services Ordered on Discharge: Y = Yes; Blank = No PT:   OT:   RN:   Equipment:   Other:     Signed: Elfredia Nevins, MD   PGY 1, Internal Medicine Resident 11/09/2012, 10:26 AM

## 2012-11-09 NOTE — Progress Notes (Signed)
Subjective:    Patient states that his chest pain has resolved, and states he is ready to go home. He complains of a small quantity of blood when wiping after a BM this morning, which he states happens occasionally at home as well. He denies SOB. He has no other complaints this AM.   Interval Events: No acute events.    Objective:    Vital Signs:   Temp:  [97.2 F (36.2 C)-98.7 F (37.1 C)] 97.2 F (36.2 C) (03/31 0601) Pulse Rate:  [56-75] 56 (03/31 0601) Resp:  [14-19] 19 (03/31 0601) BP: (154-183)/(86-118) 154/86 mmHg (03/31 0601) SpO2:  [94 %-99 %] 97 % (03/31 0601) FiO2 (%):  [21 %] 21 % (03/31 0426) Weight:  [433 lb (196.408 kg)] 433 lb (196.408 kg) (03/30 1647) Last BM Date: 11/07/12  24-hour weight change: Weight change:   Intake/Output:  No intake or output data in the 24 hours ending 11/09/12 0829    Physical Exam: General: Vital signs reviewed and noted. Severe obesity.  Lungs:  Normal respiratory effort. Clear to auscultation BL without crackles or wheezes.  Heart: RRR. S1 and S2 normal without gallop, murmur, or rubs.  Abdomen:  BS normoactive. Soft, Nondistended, non-tender.  No masses or organomegaly.  Extremities: No pretibial edema.     Labs:  Basic Metabolic Panel:  Recent Labs Lab 11/08/12 1655 11/09/12 0230  NA 140 139  K 3.7 3.4*  CL 102 101  CO2 30 30  GLUCOSE 91 98  BUN 9 10  CREATININE 1.05 1.12  CALCIUM 9.2 8.7    Liver Function Tests:  Recent Labs Lab 11/08/12 1655 11/09/12 0230  AST 16 14  ALT 20 17  ALKPHOS 33* 31*  BILITOT 1.2 1.4*  PROT 6.8 6.3  ALBUMIN 3.8 3.5   CBC:  Recent Labs Lab 11/08/12 1655 11/09/12 0230  WBC 5.9 6.5  HGB 13.7 13.2  HCT 41.9 41.1  MCV 85.3 86.3  PLT 231 213    Cardiac Enzymes:  Recent Labs Lab 11/08/12 2050 11/09/12 0230  TROPONINI <0.30 <0.30    Coagulation Studies:  Recent Labs  11/08/12 1655  LABPROT 13.3  INR 1.02   Imaging: Dg Chest Portable 1  View  11/08/2012  *RADIOLOGY REPORT*  Clinical Data: Stabbing chest pain today.  Shortness of breath with nausea.  PORTABLE CHEST - 1 VIEW  Comparison: 10/21/2012 and 02/10/2012.  Findings: 1654 hours.  There are lower lung volumes.  Cardiomegaly and vascular congestion are again noted.  There is no overt pulmonary edema or confluent airspace opacity.  There is no pneumothorax or significant pleural effusion.  Telemetry leads overlie the chest.  IMPRESSION: Cardiomegaly and vascular congestion, similar to the prior examination.  No new findings.   Original Report Authenticated By: Carey Bullocks, M.D.        Medications:    Infusions:    Scheduled Medications: . carvedilol  25 mg Oral BID WC  . furosemide  40 mg Oral Daily  . heparin subcutaneous  5,000 Units Subcutaneous Q8H  . lisinopril  20 mg Oral Daily  . potassium chloride  40 mEq Oral Once  . sodium chloride  3 mL Intravenous Q12H    PRN Medications: sodium chloride, acetaminophen, acetaminophen, alum & mag hydroxide-simeth, morphine injection, nitroGLYCERIN, ondansetron (ZOFRAN) IV, ondansetron   Assessment/ Plan:   Pt is a 32 y.o. man with PMH significant for hypertension, severe obesity (BMI > 60), CHF with EF of 25-30% and diastolic dysfunction presents who was admitted  for chest pain.   Chest pain - likely secondary to increased coronary demand in setting of elevated BP. Troponin neg x 3, and EKG on admission unchanged from baseline. Repeat EKG this AM revealed unchanged inferior lead TWI.  - likely discharge home today   Chronic mixed CHF - Patient was noted to have EF of 25-30% and grade 2 diastolic dysfunction per echo in July 2013 with diffuse hypokinesis. No evidence of exacerbation--CXR reveals cardiomegaly and vascular congestion that was unchanged from his prior CXR from 3/12, and proBNP was wnl on admission (assuming cutoff of 450pg/mL). - Continue home dose of Lasix - Strict I.'s and O's  - Daily weights  -  Continue coreg and lisinopril   HTN - Still having mildly elevated BP this AM (150s/100s).  - continue home meds (lasix, lisinopril, coreg) - will consider escalating home anti-HTN therapy prior to discharge  Probable Sleep apnea - He is scheduled for sleep study on April 21.   Bloody BM - complaints most consistent with hemorrhoidal bleeding. No evidence of significant blood loss.  - f/u complaints at time of hospital follow-up visit   DVT PPX - heparin  CODE STATUS - full  CONSULTS PLACED - N/A  DISPO - Chest pain resolved and patient is at baseline. Will likely discharge home today with follow-up in the [facility].   The patient does have a current PCP ([facility], Brien Few, MD) and does need an [facility] hospital follow-up appointment after discharge.    Is the [facility] hospital follow-up appointment a one-time only appointment? not applicable.  Does the patient have transportation limitations that hinder transportation to clinic appointments? unknown   SERVICE NEEDED AT DISCHARGE - TO BE DETERMINED DURING HOSPITAL COURSE         Y = Yes, Blank = No PT:   OT:   RN:   Equipment:   Other:      Length of Stay: 1 day(s)   Signed: Elfredia Nevins, MD  PGY-1, Internal Medicine Resident Pager: [phone] (7AM-5PM) 11/09/2012, 8:29 AM

## 2012-11-09 NOTE — H&P (Signed)
Internal Medicine Teaching Service Attending Note Date: 11/09/2012  Patient name: Mark Baker  Medical record number: [id number]  Date of birth: [birth year]   I have seen and evaluated Mark Baker and discussed their care with the Residency Team.    [name] is a 31yo man with systolic CHF, unknown etiology, who presented to the ED complaining of chest pain.  [name] reports the pain was sudden in onset, left sided chest pain that occurred while he was cleaning the house and playing with his kids.  The pain radiated to his mid chest and then to the right side, lasting a couple of hours.  It was 7/10 at its worst and described as sharp.  He reports no alleviating or aggravating symptoms until coming to the ED and receiving morphine, which did alleviate the pain.  He had another episode of chest pain in the ED which lasted about 30 minutes and resolved on its own.  He reports some sweating and nausea associated with the pain.  His wife, who was in the room, reports that he does get SOB often.  He denied orthopnea, PND or worsening peripheral edema.    [name] is not followed by a cardiologist.  The etiology of his CHF is thought to be due to either HTN or OSA.  He does have episodes of apnea at night, witnessed by his wife.  He has a sleep study and a cardiology appointment which have been ordered and are pending.   For further history, please see resident note.   Physical Exam: Blood pressure 159/105, pulse 74, temperature 97.2 F (36.2 C), temperature source Oral, resp. rate 19, height 5\' 8"  (1.727 m), weight 433 lb (196.408 kg), SpO2 97.00%. General appearance: alert, cooperative, appears stated age and morbidly obese Head: Normocephalic, without obvious abnormality, atraumatic Eyes: EOMI, anciteric sclerae Neck: unable to assess JVD 2/2 body habitus Lungs: Very decreased breath sounds due to morbid obesity, clear in the apices, unable to assess bases Heart: Difficult to appreciate heart  sounds, RR, NR, no murmur.  TTP in the right side of chest Abdomen: obese, NT, +BS Extremities: minimal edema to bilateral ankles Skin: Skin color, texture, turgor normal. No rashes or lesions Neurologic: Grossly normal  Lab results: Results for orders placed during the hospital encounter of 11/08/12 (from the past 24 hour(s))  PRO B NATRIURETIC PEPTIDE     Status: Abnormal   Collection Time    11/08/12  4:54 PM      Result Value Range   Pro B Natriuretic peptide (BNP) 241.8 (*) 0 - 125 pg/mL  CBC     Status: None   Collection Time    11/08/12  4:55 PM      Result Value Range   WBC 5.9  4.0 - 10.5 K/uL   RBC 4.91  4.22 - 5.81 MIL/uL   Hemoglobin 13.7  13.0 - 17.0 g/dL   HCT 11.9  14.7 - 82.9 %   MCV 85.3  78.0 - 100.0 fL   MCH 27.9  26.0 - 34.0 pg   MCHC 32.7  30.0 - 36.0 g/dL   RDW 56.2  13.0 - 86.5 %   Platelets 231  150 - 400 K/uL  COMPREHENSIVE METABOLIC PANEL     Status: Abnormal   Collection Time    11/08/12  4:55 PM      Result Value Range   Sodium 140  135 - 145 mEq/L   Potassium 3.7  3.5 - 5.1 mEq/L  Chloride 102  96 - 112 mEq/L   CO2 30  19 - 32 mEq/L   Glucose, Bld 91  70 - 99 mg/dL   BUN 9  6 - 23 mg/dL   Creatinine, Ser 1.61  0.50 - 1.35 mg/dL   Calcium 9.2  8.4 - 09.6 mg/dL   Total Protein 6.8  6.0 - 8.3 g/dL   Albumin 3.8  3.5 - 5.2 g/dL   AST 16  0 - 37 U/L   ALT 20  0 - 53 U/L   Alkaline Phosphatase 33 (*) 39 - 117 U/L   Total Bilirubin 1.2  0.3 - 1.2 mg/dL   GFR calc non Af Amer >90  >90 mL/min   GFR calc Af Amer >90  >90 mL/min  PROTIME-INR     Status: None   Collection Time    11/08/12  4:55 PM      Result Value Range   Prothrombin Time 13.3  11.6 - 15.2 seconds   INR 1.02  0.00 - 1.49  APTT     Status: None   Collection Time    11/08/12  4:55 PM      Result Value Range   aPTT 31  24 - 37 seconds  POCT I-STAT TROPONIN I     Status: None   Collection Time    11/08/12  5:10 PM      Result Value Range   Troponin i, poc 0.03  0.00 - 0.08  ng/mL   Comment 3           POCT I-STAT TROPONIN I     Status: None   Collection Time    11/08/12  7:28 PM      Result Value Range   Troponin i, poc 0.02  0.00 - 0.08 ng/mL   Comment 3           TROPONIN I     Status: None   Collection Time    11/08/12  8:50 PM      Result Value Range   Troponin I <0.30  <0.30 ng/mL  HEMOGLOBIN A1C     Status: None   Collection Time    11/08/12  8:50 PM      Result Value Range   Hemoglobin A1C 5.4  <5.7 %   Mean Plasma Glucose 108  <117 mg/dL  URINALYSIS, ROUTINE W REFLEX MICROSCOPIC     Status: None   Collection Time    11/08/12  8:55 PM      Result Value Range   Color, Urine YELLOW  YELLOW   APPearance CLEAR  CLEAR   Specific Gravity, Urine 1.024  1.005 - 1.030   pH 6.0  5.0 - 8.0   Glucose, UA NEGATIVE  NEGATIVE mg/dL   Hgb urine dipstick NEGATIVE  NEGATIVE   Bilirubin Urine NEGATIVE  NEGATIVE   Ketones, ur NEGATIVE  NEGATIVE mg/dL   Protein, ur NEGATIVE  NEGATIVE mg/dL   Urobilinogen, UA 1.0  0.0 - 1.0 mg/dL   Nitrite NEGATIVE  NEGATIVE   Leukocytes, UA NEGATIVE  NEGATIVE  URINE RAPID DRUG SCREEN (HOSP PERFORMED)     Status: Abnormal   Collection Time    11/08/12  8:56 PM      Result Value Range   Opiates POSITIVE (*) NONE DETECTED   Cocaine NONE DETECTED  NONE DETECTED   Benzodiazepines NONE DETECTED  NONE DETECTED   Amphetamines NONE DETECTED  NONE DETECTED   Tetrahydrocannabinol NONE DETECTED  NONE DETECTED  Barbiturates NONE DETECTED  NONE DETECTED  TROPONIN I     Status: None   Collection Time    11/09/12  2:30 AM      Result Value Range   Troponin I <0.30  <0.30 ng/mL  COMPREHENSIVE METABOLIC PANEL     Status: Abnormal   Collection Time    11/09/12  2:30 AM      Result Value Range   Sodium 139  135 - 145 mEq/L   Potassium 3.4 (*) 3.5 - 5.1 mEq/L   Chloride 101  96 - 112 mEq/L   CO2 30  19 - 32 mEq/L   Glucose, Bld 98  70 - 99 mg/dL   BUN 10  6 - 23 mg/dL   Creatinine, Ser 4.09  0.50 - 1.35 mg/dL   Calcium 8.7   8.4 - 81.1 mg/dL   Total Protein 6.3  6.0 - 8.3 g/dL   Albumin 3.5  3.5 - 5.2 g/dL   AST 14  0 - 37 U/L   ALT 17  0 - 53 U/L   Alkaline Phosphatase 31 (*) 39 - 117 U/L   Total Bilirubin 1.4 (*) 0.3 - 1.2 mg/dL   GFR calc non Af Amer 86 (*) >90 mL/min   GFR calc Af Amer >90  >90 mL/min  CBC     Status: None   Collection Time    11/09/12  2:30 AM      Result Value Range   WBC 6.5  4.0 - 10.5 K/uL   RBC 4.76  4.22 - 5.81 MIL/uL   Hemoglobin 13.2  13.0 - 17.0 g/dL   HCT 91.4  78.2 - 95.6 %   MCV 86.3  78.0 - 100.0 fL   MCH 27.7  26.0 - 34.0 pg   MCHC 32.1  30.0 - 36.0 g/dL   RDW 21.3  08.6 - 57.8 %   Platelets 213  150 - 400 K/uL  LIPID PANEL     Status: Abnormal   Collection Time    11/09/12  2:30 AM      Result Value Range   Cholesterol 180  0 - 200 mg/dL   Triglycerides 469 (*) <150 mg/dL   HDL 30 (*) >62 mg/dL   Total CHOL/HDL Ratio 6.0     VLDL 41 (*) 0 - 40 mg/dL   LDL Cholesterol 952 (*) 0 - 99 mg/dL    EKG reviewed, unchanged from previous, no acute ST-TW changes  Imaging results:  Dg Chest Portable 1 View  11/08/2012  *RADIOLOGY REPORT*  Clinical Data: Stabbing chest pain today.  Shortness of breath with nausea.  PORTABLE CHEST - 1 VIEW  Comparison: 10/21/2012 and 02/10/2012.  Findings: 1654 hours.  There are lower lung volumes.  Cardiomegaly and vascular congestion are again noted.  There is no overt pulmonary edema or confluent airspace opacity.  There is no pneumothorax or significant pleural effusion.  Telemetry leads overlie the chest.  IMPRESSION: Cardiomegaly and vascular congestion, similar to the prior examination.  No new findings.   Original Report Authenticated By: Carey Bullocks, M.D.     Assessment and Plan: I agree with the formulated Assessment and Plan with the following changes:   1. Chest pain, concerning for ACS - Given Mr. Girvan's history of severe CHF, will rule out for acute process - CE X 3, EKG in the AM - Pain control with nitro and  morphine - Check A1C and FLP  2. Systolic CHF, unclear etiology - Mr. Demedeiros needs  further workup to discover the etiology of his CHF including sleep study and likely a LHC.  These can be performed outpatient. - As concerns this admission, he seems to be at his baseline.  He does need better BP control, will add back HCTZ - Continue coreg, lisinopril, lasix  Other issues as per resident note.   Inez Catalina, MD 3/31/201410:40 AM

## 2012-11-10 ENCOUNTER — Telehealth: Payer: Self-pay | Admitting: *Deleted

## 2012-11-10 NOTE — Telephone Encounter (Signed)
Would advise that his medical problems would not keep him out of work. If there is norovirus at work would advise usual hand washing before and after patient exposure or around sick individuals. Would also advise washing before eating or leaving work.   Dr. Dorise Hiss

## 2012-11-10 NOTE — Progress Notes (Signed)
Utilization review completed.  

## 2012-11-10 NOTE — Telephone Encounter (Signed)
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 He sees dr Lavena Bullion fri 4/4. Please advise

## 2012-11-10 NOTE — Telephone Encounter (Signed)
Spoke w/ pt and relayed message, he is agreeable

## 2012-11-12 ENCOUNTER — Encounter (HOSPITAL_BASED_OUTPATIENT_CLINIC_OR_DEPARTMENT_OTHER): Payer: 59

## 2012-11-12 ENCOUNTER — Ambulatory Visit (INDEPENDENT_AMBULATORY_CARE_PROVIDER_SITE_OTHER): Payer: 59 | Admitting: Psychology

## 2012-11-12 DIAGNOSIS — F4321 Adjustment disorder with depressed mood: Secondary | ICD-10-CM

## 2012-11-13 ENCOUNTER — Ambulatory Visit (INDEPENDENT_AMBULATORY_CARE_PROVIDER_SITE_OTHER): Payer: 59 | Admitting: Radiation Oncology

## 2012-11-13 ENCOUNTER — Encounter: Payer: Self-pay | Admitting: Radiation Oncology

## 2012-11-13 VITALS — BP 138/81 | HR 91 | Temp 99.7°F | Ht 75.0 in | Wt >= 6400 oz

## 2012-11-13 DIAGNOSIS — G4730 Sleep apnea, unspecified: Secondary | ICD-10-CM

## 2012-11-13 DIAGNOSIS — I10 Essential (primary) hypertension: Secondary | ICD-10-CM

## 2012-11-13 DIAGNOSIS — I5042 Chronic combined systolic (congestive) and diastolic (congestive) heart failure: Secondary | ICD-10-CM

## 2012-11-13 DIAGNOSIS — I509 Heart failure, unspecified: Secondary | ICD-10-CM

## 2012-11-13 MED ORDER — SILDENAFIL CITRATE 50 MG PO TABS: 50.0000 mg | ORAL_TABLET | ORAL | Status: DC | PRN

## 2012-11-13 NOTE — Progress Notes (Signed)
Subjective:    Patient ID: Mark Baker, male    DOB: 12-28-1980, 32 y.o.   MRN: [id number]  HPI Patient is a 32 year old man with PMH significant for systolic and diastolic CHF who presents to clinic today for hospital followup after a recent admission for chest pain secondary to demand ischemia. He denies any episodes of chest pain since being discharged. He denies any shortness of breath or leg swelling recently, and he states his weight has been stable at ~430lb since discharge.  Mark Baker only complaint today is of erectile dysfunction, which he states he began to notice after his Coreg dose was increased to 25 mg twice a day recently. He states there has been no change in his sexual desire. He requests a prescription for Viagra today to help with the issue.  Review of Systems  All other systems reviewed and are negative.   Current Outpatient Medications: Current Outpatient Prescriptions  Medication Sig Dispense Refill  . carvedilol (COREG) 12.5 MG tablet Take 2 tablets (25 mg total) by mouth 2 (two) times daily with a meal.  60 tablet  5  . furosemide (LASIX) 20 MG tablet Take 2 tablets (40 mg total) by mouth daily.  30 tablet  11  . hydrochlorothiazide (MICROZIDE) 12.5 MG capsule Take 1 capsule (12.5 mg total) by mouth daily.  30 capsule  1  . lisinopril (PRINIVIL,ZESTRIL) 20 MG tablet Take 1 tablet (20 mg total) by mouth daily.  30 tablet  5  . sildenafil (VIAGRA) 50 MG tablet Take 1 tablet (50 mg total) by mouth as needed for erectile dysfunction.  20 tablet  1   No current facility-administered medications for this visit.    Allergies: Allergies  Allergen Reactions  . Aspirin Hives     Past Medical History: Past Medical History  Diagnosis Date  . Hypertension   . Asthma     childhood asthma  . Headache     Past Surgical History: Past Surgical History  Procedure Laterality Date  . Anterior cruciate ligament repair      Family History: Family History  Problem  Relation Age of Onset  . Hypertension Father   . Heart failure Father   . Hypertension Sister   . Stroke Maternal Grandmother     Social History: History   Social History  . Marital Status: Married    Spouse Name: N/A    Number of Children: N/A  . Years of Education: N/A   Occupational History  . Not on file.   Social History Main Topics  . Smoking status: Never Smoker   . Smokeless tobacco: Never Used  . Alcohol Use: Yes     Comment: seldom.  . Drug Use: No  . Sexually Active: Not on file   Other Topics Concern  . Not on file   Social History Narrative   Lives in [place] with wife of 4 years.  Works for [employer] at [employer].  1 son and 2 daughters.       Vital Signs: Blood pressure 138/81, pulse 91, temperature 99.7 F (37.6 C), temperature source Oral, height 6\' 3"  (1.905 m), weight 430 lb 4.8 oz (195.183 kg), SpO2 97.00%.       Objective:   Physical Exam  Constitutional: He is oriented to person, place, and time. He appears well-developed and well-nourished. No distress.  Severe obesity.  HENT:  Head: Normocephalic and atraumatic.  Eyes: Conjunctivae are normal. Pupils are equal, round, and reactive to light. No scleral icterus.  Neck: Normal range of motion. Neck supple. No tracheal deviation present.  Cardiovascular: Normal rate and regular rhythm.   No murmur heard. Pulmonary/Chest: Effort normal. He has no wheezes. He has no rales.  Abdominal: Soft. Bowel sounds are normal. He exhibits no distension. There is no tenderness.  Musculoskeletal: Normal range of motion. He exhibits no edema.  Neurological: He is alert and oriented to person, place, and time. No cranial nerve deficit.  Skin: Skin is warm and dry. No erythema.  Psychiatric: He has a normal mood and affect. His behavior is normal.          Assessment & Plan:

## 2012-11-13 NOTE — Assessment & Plan Note (Addendum)
No signs/symptoms of exacerbation today. Pt states he has never seen a cardiologist as an outpatient since his CHF diagnosis in 02/2012 as he states he has been unable to attend several appointments. No changes were made to his medications today, however he was counseled to never take NTG while taking viagra and this med was removed from his med list (states he has not been using this medication recently and had already self-discontinued).  - cont lasix 40mg  QD - cont coreg 25mg  bid - cont lisinopril - f/u with cardiology (appt on 4/10)

## 2012-11-13 NOTE — Patient Instructions (Addendum)
General Instructions: - Please take Viagra as needed (see prescription for details). - As we discussed, stop taking nitroglycerin, and never take nitroglycerin while taking Viagra - Please otherwise continue taking your previous medications as prescribed - Please attend your followup appointment with Dr. Elease Hashimoto of [facility] cardiology on 11/19/2012  - please attend your sleep study on 11/30/2012   We will have you return to clinic for a visit in approximately 3 months. Please contact us in the meantime if you have any new or worsening symptoms. Have a great day.   Treatment Goals:  Goals (1 Years of Data) as of 11/13/12         As of Today As of Today 11/09/12 11/09/12 11/09/12     Blood Pressure    . Blood Pressure < 140/90  138/81 138/81 131/80 159/105 154/86      Progress Toward Treatment Goals:  Treatment Goal 11/13/2012  Blood pressure at goal    Self Care Goals & Plans:  Self Care Goal 11/13/2012  Manage my medications bring my medications to every visit  Monitor my health keep track of my weight  Eat healthy foods -  Be physically active find an activity I enjoy       Care Management & Community Referrals:

## 2012-11-14 NOTE — Assessment & Plan Note (Signed)
Nocturnal polysomnography scheduled for 4/21.

## 2012-11-14 NOTE — Assessment & Plan Note (Signed)
BP Readings from Last 3 Encounters:  11/13/12 138/81  11/09/12 131/80  10/27/12 161/88    Lab Results  Component Value Date   NA 142 11/09/2012   K 3.7 11/09/2012   CREATININE 1.09 11/09/2012    Assessment: Blood pressure control: controlled Progress toward BP goal:  at goal Comments:  Plan: Medications:  continue current medications Educational resources provided:   Self management tools provided:   Other plans:

## 2012-11-19 ENCOUNTER — Ambulatory Visit (INDEPENDENT_AMBULATORY_CARE_PROVIDER_SITE_OTHER): Payer: 59 | Admitting: Cardiovascular Disease

## 2012-11-19 ENCOUNTER — Encounter: Payer: Self-pay | Admitting: Cardiovascular Disease

## 2012-11-19 VITALS — BP 150/98 | HR 79 | Ht 75.0 in | Wt >= 6400 oz

## 2012-11-19 DIAGNOSIS — R079 Chest pain, unspecified: Secondary | ICD-10-CM

## 2012-11-19 DIAGNOSIS — I10 Essential (primary) hypertension: Secondary | ICD-10-CM

## 2012-11-19 DIAGNOSIS — I509 Heart failure, unspecified: Secondary | ICD-10-CM

## 2012-11-19 DIAGNOSIS — G4730 Sleep apnea, unspecified: Secondary | ICD-10-CM

## 2012-11-19 DIAGNOSIS — I5042 Chronic combined systolic (congestive) and diastolic (congestive) heart failure: Secondary | ICD-10-CM

## 2012-11-19 MED ORDER — AMLODIPINE BESYLATE 5 MG PO TABS: 5.0000 mg | ORAL_TABLET | Freq: Every day | ORAL | Status: DC

## 2012-11-19 NOTE — Assessment & Plan Note (Signed)
He is scheduled have a sleep study in several weeks.

## 2012-11-19 NOTE — Progress Notes (Signed)
Mark Baker     Mark Baker Date of Birth  Mar 19, 1981       [facility] Office [address]  [address], [address] [place], [place]  [address]   [place], [place]  [address] [phone]     [phone]   Fax  [phone]    Fax [phone]  Problem List: 1. Chest pain 2. Chronic systolic congestive heart failure 3. Sleep apnea -  4. Morbid obesity.  History of Present Illness:  Mark Baker is a 32 yo with chronic dyspnea.  She has sharp pain - mid sternal radiating to the left side of his chest.  These pains last from 5 minutes - 1 hour.  He was admitted in March, 2014 - ruled out for mI.  His episodes are not associated with exercise, eating, drinking, change of position. He thinks it may be related to stress.  He works out at [employer] 3 times a week. He plays basketball. He's never had any episodes of chest pain playing basketball.  He's lost between 25 and 30 pounds since December.  He has had CHF diagnoses since July , 2013.  At the time of his diagnosis his ejection fraction was 25-30%. He's been on medical therapy since that time and is feeling much better than July.       He watches his salt intake.  He works at [employer] at [employer].    Current Outpatient Prescriptions on File Prior to Visit  Medication Sig Dispense Refill  . carvedilol (COREG) 12.5 MG tablet Take 2 tablets (25 mg total) by mouth 2 (two) times daily with a meal.  60 tablet  5  . furosemide (LASIX) 20 MG tablet Take 2 tablets (40 mg total) by mouth daily.  30 tablet  11  . hydrochlorothiazide (MICROZIDE) 12.5 MG capsule Take 1 capsule (12.5 mg total) by mouth daily.  30 capsule  1  . lisinopril (PRINIVIL,ZESTRIL) 20 MG tablet Take 1 tablet (20 mg total) by mouth daily.  30 tablet  5  . sildenafil (VIAGRA) 50 MG tablet Take 1 tablet (50 mg total) by mouth as needed for erectile dysfunction.  20 tablet  1   No current facility-administered medications on file prior to  visit.    Allergies  Allergen Reactions  . Aspirin Hives  . Nitroglycerin     Past Medical History  Diagnosis Date  . Hypertension   . Asthma     childhood asthma  . Headache     Past Surgical History  Procedure Laterality Date  . Anterior cruciate ligament repair      History  Smoking status  . Never Smoker   Smokeless tobacco  . Never Used    History  Alcohol Use  . Yes    Comment: seldom.    Family History  Problem Relation Age of Onset  . Hypertension Father   . Heart failure Father   . Hypertension Sister   . Stroke Maternal Grandmother     Reviw of Systems:  Reviewed in the HPI.  All other systems are negative.  Physical Exam: Blood pressure 150/98, pulse 79, height 6\' 3"  (1.905 m), weight 425 lb 12.8 oz (193.142 kg). General: Well developed, well nourished, in no acute distress.  Head: Normocephalic, atraumatic, sclera non-icteric, mucus membranes are moist,   Neck: Supple. Carotids are 2 + without bruits. No JVD   Lungs: Clear   Heart: RR, normal S1, S2  Abdomen: Soft, non-tender, non-distended  with normal bowel sounds. Morbid obesity  Msk:  Strength and tone are normal   Extremities: No clubbing or cyanosis. No edema.  Distal pedal pulses are 2+ and equal    Neuro: CN II - XII intact.  Alert and oriented X 3.   Psych:  Normal   ECG: November 19, 2012:  NSR at 78 , LAE, LVH  Assessment / Plan:

## 2012-11-19 NOTE — Patient Instructions (Addendum)
Your physician has requested that you have an echocardiogram. Echocardiography is a painless test that uses sound waves to create images of your heart. It provides your doctor with information about the size and shape of your heart and how well your heart's chambers and valves are working. This procedure takes approximately one hour. There are no restrictions for this procedure.  Your physician has recommended you make the following change in your medication:   START AMLODIPINE 5 MG DAILY  Your physician wants you to follow-up in: 3 MONTHS  You will receive a reminder letter in the mail two months in advance. If you don't receive a letter, please call our office to schedule the follow-up appointment.  REDUCE HIGH SODIUM FOODS LIKE CANNED SOUP, GRAVY, SAUCES, READY PREPARED FOODS LIKE FROZEN FOODS; LEAN CUISINE, LASAGNA. BACON, SAUSAGE, LUNCH MEAT, FAST FOODS.Marland KitchenHOT DOGS AND CHIPS  DASH Diet The DASH diet stands for "Dietary Approaches to Stop Hypertension." It is a healthy eating plan that has been shown to reduce high blood pressure (hypertension) in as little as 14 days, while also possibly providing other significant health benefits. These other health benefits include reducing the risk of breast cancer after menopause and reducing the risk of type 2 diabetes, heart disease, colon cancer, and stroke. Health benefits also include weight loss and slowing kidney failure in patients with chronic kidney disease.  DIET GUIDELINES  Limit salt (sodium). Your diet should contain less than 1500 mg of sodium daily.  Limit refined or processed carbohydrates. Your diet should include mostly whole grains. Desserts and added sugars should be used sparingly.  Include small amounts of heart-healthy fats. These types of fats include nuts, oils, and tub margarine. Limit saturated and trans fats. These fats have been shown to be harmful in the body. CHOOSING FOODS  The following food groups are based on a 2000  calorie diet. See your Registered Dietitian for individual calorie needs. Grains and Grain Products (6 to 8 servings daily)  Eat More Often: Whole-wheat bread, brown rice, whole-grain or wheat pasta, quinoa, popcorn without added fat or salt (air popped).  Eat Less Often: Mahrt bread, Calo pasta, Brem rice, cornbread. Vegetables (4 to 5 servings daily)  Eat More Often: Fresh, frozen, and canned vegetables. Vegetables may be raw, steamed, roasted, or grilled with a minimal amount of fat.  Eat Less Often/Avoid: Creamed or fried vegetables. Vegetables in a cheese sauce. Fruit (4 to 5 servings daily)  Eat More Often: All fresh, canned (in natural juice), or frozen fruits. Dried fruits without added sugar. One hundred percent fruit juice ( cup [237 mL] daily).  Eat Less Often: Dried fruits with added sugar. Canned fruit in light or heavy syrup. [employer], Fish, and Poultry (2 servings or less daily. One serving is 3 to 4 oz [85-114 g]).  Eat More Often: Ninety percent or leaner ground beef, tenderloin, sirloin. Round cuts of beef, chicken breast, [place] breast. All fish. Grill, bake, or broil your meat. Nothing should be fried.  Eat Less Often/Avoid: Fatty cuts of meat, [place], or chicken leg, thigh, or wing. Fried cuts of meat or fish. Dairy (2 to 3 servings)  Eat More Often: Low-fat or fat-free milk, low-fat plain or light yogurt, reduced-fat or part-skim cheese.  Eat Less Often/Avoid: Milk (whole, 2%).Whole milk yogurt. Full-fat cheeses. Nuts, Seeds, and Legumes (4 to 5 servings per week)  Eat More Often: All without added salt.  Eat Less Often/Avoid: Salted nuts and seeds, canned beans with added salt. Fats and  Sweets (limited)  Eat More Often: Vegetable oils, tub margarines without trans fats, sugar-free gelatin. Mayonnaise and salad dressings.  Eat Less Often/Avoid: Coconut oils, palm oils, butter, stick margarine, cream, half and half, cookies, candy, pie. FOR MORE  INFORMATION The Dash Diet Eating Plan: www.dashdiet.org Document Released: 07/18/2011 Document Revised: 10/21/2011 Document Reviewed: 07/18/2011 [facility] Patient Information 2013 [place], [place].

## 2012-11-19 NOTE — Assessment & Plan Note (Signed)
He presents with some episodes of chest tightness but these are clearly atypical episodes of chest pain. He has been seen on several occasions and has had negative cardiac enzymes. He plays basketball becomes weak and has never had episodes of chest pain following his basketball games. He thinks that these episodes are more related to stress.  He's too large to have a stress Myoview study. Although we could perform a cardiac catheterization at this weight, I think it we would have a lower complication risk and get better images if we would wait and do the heart catheterization after he has  lost weight.

## 2012-11-19 NOTE — Assessment & Plan Note (Signed)
He seems to be doing fairly well. He was diagnosed with congestive heart failure in July, 2013. He's been on medical therapy since that time.  It is clear that he still eats some salty foods. He is doing much better but still has not given up so completely.  His blood pressure remains mildly elevated. We'll have him decrease his salt intake. We will add amlodipine 5 mg a day. We'll check an echocardiogram. If his echo reveals persistent left ventricular dysfunction, we will add hydralazine.  Seen in 3 months for followup visit.

## 2012-11-23 ENCOUNTER — Emergency Department (HOSPITAL_COMMUNITY)
Admission: EM | Admit: 2012-11-23 | Discharge: 2012-11-23 | Discharge status: Against Medical Advice | Payer: 59 | Attending: Emergency Medicine | Admitting: Emergency Medicine

## 2012-11-23 ENCOUNTER — Encounter (HOSPITAL_COMMUNITY): Payer: Self-pay | Admitting: Emergency Medicine

## 2012-11-23 ENCOUNTER — Emergency Department (HOSPITAL_COMMUNITY): Payer: 59

## 2012-11-23 DIAGNOSIS — R059 Cough, unspecified: Secondary | ICD-10-CM | POA: Insufficient documentation

## 2012-11-23 DIAGNOSIS — R042 Hemoptysis: Secondary | ICD-10-CM | POA: Insufficient documentation

## 2012-11-23 DIAGNOSIS — J45901 Unspecified asthma with (acute) exacerbation: Secondary | ICD-10-CM | POA: Insufficient documentation

## 2012-11-23 DIAGNOSIS — Z79899 Other long term (current) drug therapy: Secondary | ICD-10-CM | POA: Insufficient documentation

## 2012-11-23 DIAGNOSIS — I5042 Chronic combined systolic (congestive) and diastolic (congestive) heart failure: Secondary | ICD-10-CM | POA: Insufficient documentation

## 2012-11-23 DIAGNOSIS — R079 Chest pain, unspecified: Secondary | ICD-10-CM | POA: Insufficient documentation

## 2012-11-23 DIAGNOSIS — I10 Essential (primary) hypertension: Secondary | ICD-10-CM | POA: Insufficient documentation

## 2012-11-23 DIAGNOSIS — R05 Cough: Secondary | ICD-10-CM | POA: Insufficient documentation

## 2012-11-23 LAB — BASIC METABOLIC PANEL
BUN: 13 mg/dL (ref 6–23)
Chloride: 99 mEq/L (ref 96–112)
GFR calc Af Amer: >90 mL/min (ref >90)
GFR calc non Af Amer: >90 mL/min (ref >90)
Potassium: 3.5 mEq/L (ref 3.5–5.1)
Sodium: 140 mEq/L (ref 135–145)

## 2012-11-23 LAB — CBC
MCHC: 34.1 g/dL (ref 30.0–36.0)
Platelets: 252 10*3/uL (ref 150–400)
RDW: 12.3 % (ref 11.5–15.5)
WBC: 6.4 10*3/uL (ref 4.0–10.5)

## 2012-11-23 LAB — POCT I-STAT TROPONIN I: Troponin i, poc: 0.02 ng/mL (ref 0.00–0.08)

## 2012-11-23 MED ORDER — ACETAMINOPHEN 500 MG PO TABS
1000.0000 mg | ORAL_TABLET | Freq: Once | ORAL | Status: AC
  Administered 2012-11-23: 1000 mg via ORAL
  Filled 2012-11-23: qty 2

## 2012-11-23 NOTE — ED Notes (Signed)
Pt sts noticed some blood in his saliva that he may have coughed up today; pt then started having CP and SOB; pt sts hx of CHF

## 2012-11-23 NOTE — ED Notes (Signed)
Pt refusing futhure treatment and wanting to go home

## 2012-11-23 NOTE — ED Provider Notes (Signed)
History     CSN: [id number]  Arrival date & time 11/23/12  1753   First MD Initiated Contact with Patient 11/23/12 1905      Chief Complaint  Patient presents with  . Chest Pain  . Shortness of Breath    (Consider location/radiation/quality/duration/timing/severity/associated sxs/prior treatment) HPI  Mark Baker is a 32 y.o. male with past medical history of morbid obesity, CHF, hypertension complaining of chest pain shortness of breath and hemoptysis earlier in the day. Patient denies fever, productive cough, abdominal pain, nausea vomiting, change in bowel or bladder habits. Patient is followed by [facility] cardiologist Dr. Georgann Housekeeper.  nasser   Past Medical History  Diagnosis Date  . Hypertension   . Asthma     childhood asthma  . Headache   . CHF (congestive heart failure)     Past Surgical History  Procedure Laterality Date  . Anterior cruciate ligament repair      Family History  Problem Relation Age of Onset  . Hypertension Father   . Heart failure Father   . Hypertension Sister   . Stroke Maternal Grandmother     History  Substance Use Topics  . Smoking status: Never Smoker   . Smokeless tobacco: Never Used  . Alcohol Use: Yes     Comment: seldom.      Review of Systems  Constitutional: Negative for fever.  Respiratory: Positive for cough and shortness of breath.   Cardiovascular: Positive for chest pain.  Gastrointestinal: Negative for nausea, vomiting, abdominal pain and diarrhea.  All other systems reviewed and are negative.    Allergies  Nitroglycerin and Aspirin  Home Medications   Current Outpatient Rx  Name  Route  Sig  Dispense  Refill  . amLODipine (NORVASC) 5 MG tablet   Oral   Take 5 mg by mouth every evening.         . carvedilol (COREG) 12.5 MG tablet   Oral   Take 25 mg by mouth 2 (two) times daily with a meal.         . furosemide (LASIX) 40 MG tablet   Oral   Take 40 mg by mouth daily.         .  hydrochlorothiazide (MICROZIDE) 12.5 MG capsule   Oral   Take 12.5 mg by mouth daily.         Marland Kitchen lisinopril (PRINIVIL,ZESTRIL) 20 MG tablet   Oral   Take 20 mg by mouth daily.         . sildenafil (VIAGRA) 50 MG tablet   Oral   Take 50 mg by mouth daily as needed for erectile dysfunction.           BP 121/73  Pulse 81  Temp(Src) 99.4 F (37.4 C) (Oral)  Resp 11  SpO2 98%  Physical Exam  Nursing note and vitals reviewed. Constitutional: He is oriented to person, place, and time. He appears well-developed and well-nourished. No distress.  Morbidly obese   HENT:  Head: Normocephalic and atraumatic.  Mouth/Throat: Oropharynx is clear and moist.  Eyes: Conjunctivae and EOM are normal. Pupils are equal, round, and reactive to light.  Neck: Normal range of motion.  Cardiovascular: Normal rate, regular rhythm and intact distal pulses.  Exam reveals no gallop and no friction rub.   No murmur heard. Pulmonary/Chest: Effort normal and breath sounds normal. No stridor. No respiratory distress. He has no wheezes. He has no rales. He exhibits no tenderness.  Abdominal: Soft. Bowel sounds are normal. He  exhibits no distension and no mass. There is no tenderness. There is no rebound and no guarding.  Musculoskeletal: Normal range of motion.  Neurological: He is alert and oriented to person, place, and time.  Psychiatric: He has a normal mood and affect.    ED Course  Procedures (including critical care time)  Labs Reviewed  CBC  BASIC METABOLIC PANEL  PRO B NATRIURETIC PEPTIDE  POCT I-STAT TROPONIN I   Dg Chest 2 View  11/23/2012  *RADIOLOGY REPORT*  Clinical Data: Hemoptysis.  Chest pain and weakness.  CHEST - 2 VIEW  Comparison: 11/08/2012  Findings: The heart is enlarged.  The vascularity is normal.  Lungs are clear.  No effusions.  No bony abnormalities.  IMPRESSION: Cardiomegaly.  No active disease.   Original Report Authenticated By: Paulina Fusi, M.D.    9:16 PM  informed by nurse that patient said "he is feeling better" and he left AGAINST MEDICAL ADVICE. Nurse said that he signed out AMA.  I was informed of this before he left.   Date: 11/23/2012  Rate: 89  Rhythm: normal sinus rhythm  QRS Axis: normal  Intervals: normal  ST/T Wave abnormalities: normal  Conduction Disutrbances:none  Narrative Interpretation: LVH  Old EKG Reviewed: unchanged   1. Chest pain   2. Systolic and diastolic CHF, chronic      MDM   Mark Baker is a 32 y.o. male with cough, shortness of breath and chest pain. Was informed by the nurse that the patient left and signed out AGAINST MEDICAL ADVICE before I could discuss this with the patient.    Filed Vitals:   11/23/12 1802 11/23/12 1911  BP: 152/87 121/73  Pulse: 85 81  Temp: 99.4 F (37.4 C)   TempSrc: Oral   Resp: 18 11  SpO2: 96% 98%       Wynetta Emery, PA-C 11/23/12 2236

## 2012-11-23 NOTE — ED Notes (Signed)
Pt signed AMA and refused to stay for treatment and care.

## 2012-11-24 ENCOUNTER — Inpatient Hospital Stay (HOSPITAL_COMMUNITY)
Admission: EM | Admit: 2012-11-24 | Discharge: 2012-11-25 | DRG: 287 | Disposition: A | Discharge status: Home / Self Care | Payer: 59 | Attending: Internal Medicine | Admitting: Internal Medicine

## 2012-11-24 ENCOUNTER — Other Ambulatory Visit (HOSPITAL_COMMUNITY): Payer: 59

## 2012-11-24 ENCOUNTER — Encounter (HOSPITAL_COMMUNITY)
Admission: EM | Disposition: A | Discharge status: Home / Self Care | Payer: Self-pay | Source: Home / Self Care | Attending: Internal Medicine

## 2012-11-24 ENCOUNTER — Encounter (HOSPITAL_COMMUNITY): Payer: Self-pay | Admitting: Emergency Medicine

## 2012-11-24 DIAGNOSIS — I10 Essential (primary) hypertension: Secondary | ICD-10-CM | POA: Diagnosis present

## 2012-11-24 DIAGNOSIS — I5043 Acute on chronic combined systolic (congestive) and diastolic (congestive) heart failure: Secondary | ICD-10-CM | POA: Diagnosis present

## 2012-11-24 DIAGNOSIS — Z886 Allergy status to analgesic agent status: Secondary | ICD-10-CM

## 2012-11-24 DIAGNOSIS — R079 Chest pain, unspecified: Secondary | ICD-10-CM | POA: Diagnosis present

## 2012-11-24 DIAGNOSIS — Z8249 Family history of ischemic heart disease and other diseases of the circulatory system: Secondary | ICD-10-CM

## 2012-11-24 DIAGNOSIS — Z888 Allergy status to other drugs, medicaments and biological substances status: Secondary | ICD-10-CM

## 2012-11-24 DIAGNOSIS — I509 Heart failure, unspecified: Secondary | ICD-10-CM | POA: Diagnosis present

## 2012-11-24 DIAGNOSIS — E785 Hyperlipidemia, unspecified: Secondary | ICD-10-CM | POA: Diagnosis present

## 2012-11-24 DIAGNOSIS — G4733 Obstructive sleep apnea (adult) (pediatric): Secondary | ICD-10-CM | POA: Diagnosis present

## 2012-11-24 DIAGNOSIS — I5042 Chronic combined systolic (congestive) and diastolic (congestive) heart failure: Secondary | ICD-10-CM | POA: Diagnosis present

## 2012-11-24 DIAGNOSIS — Z79899 Other long term (current) drug therapy: Secondary | ICD-10-CM

## 2012-11-24 DIAGNOSIS — I43 Cardiomyopathy in diseases classified elsewhere: Secondary | ICD-10-CM | POA: Diagnosis present

## 2012-11-24 DIAGNOSIS — R9431 Abnormal electrocardiogram [ECG] [EKG]: Secondary | ICD-10-CM

## 2012-11-24 DIAGNOSIS — R0789 Other chest pain: Principal | ICD-10-CM | POA: Diagnosis present

## 2012-11-24 DIAGNOSIS — I4581 Long QT syndrome: Secondary | ICD-10-CM | POA: Diagnosis present

## 2012-11-24 DIAGNOSIS — I110 Hypertensive heart disease with heart failure: Secondary | ICD-10-CM | POA: Diagnosis present

## 2012-11-24 DIAGNOSIS — Z6841 Body Mass Index (BMI) 40.0 and over, adult: Secondary | ICD-10-CM

## 2012-11-24 DIAGNOSIS — Z823 Family history of stroke: Secondary | ICD-10-CM

## 2012-11-24 DIAGNOSIS — J45909 Unspecified asthma, uncomplicated: Secondary | ICD-10-CM | POA: Diagnosis present

## 2012-11-24 DIAGNOSIS — R0602 Shortness of breath: Secondary | ICD-10-CM

## 2012-11-24 HISTORY — PX: LEFT HEART CATHETERIZATION WITH CORONARY ANGIOGRAM: SHX5451

## 2012-11-24 LAB — POCT I-STAT TROPONIN I: Troponin i, poc: 0.02 ng/mL (ref 0.00–0.08)

## 2012-11-24 LAB — RAPID URINE DRUG SCREEN, HOSP PERFORMED
Amphetamines: NOT DETECTED
Benzodiazepines: NOT DETECTED
Tetrahydrocannabinol: NOT DETECTED

## 2012-11-24 LAB — PRO B NATRIURETIC PEPTIDE
Pro B Natriuretic peptide (BNP): 100.1 pg/mL (ref 0–125)
Pro B Natriuretic peptide (BNP): 113.7 pg/mL (ref 0–125)

## 2012-11-24 LAB — PROTIME-INR
INR: 1.02 (ref 0.00–1.49)
Prothrombin Time: 13.3 seconds (ref 11.6–15.2)

## 2012-11-24 LAB — TROPONIN I
Troponin I: <0.3 ng/mL (ref <0.30)
Troponin I: <0.3 ng/mL (ref <0.30)

## 2012-11-24 LAB — GLUCOSE, CAPILLARY: Glucose-Capillary: 110 mg/dL — ABNORMAL HIGH (ref 70–99)

## 2012-11-24 SURGERY — LEFT HEART CATHETERIZATION WITH CORONARY ANGIOGRAM
Anesthesia: LOCAL

## 2012-11-24 MED ORDER — ENOXAPARIN SODIUM 100 MG/ML ~~LOC~~ SOLN
100.0000 mg | SUBCUTANEOUS | Status: DC
  Filled 2012-11-24: qty 1

## 2012-11-24 MED ORDER — AMLODIPINE BESYLATE 5 MG PO TABS
5.0000 mg | ORAL_TABLET | Freq: Every evening | ORAL | Status: DC
  Administered 2012-11-24: 5 mg via ORAL
  Filled 2012-11-24 (×2): qty 1

## 2012-11-24 MED ORDER — HEPARIN (PORCINE) IN NACL 2-0.9 UNIT/ML-% IJ SOLN
INTRAMUSCULAR | Status: AC
  Filled 2012-11-24: qty 1000

## 2012-11-24 MED ORDER — FENTANYL CITRATE 0.05 MG/ML IJ SOLN
INTRAMUSCULAR | Status: AC
  Filled 2012-11-24: qty 2

## 2012-11-24 MED ORDER — SODIUM CHLORIDE 0.9 % IJ SOLN: 3.0000 mL | INTRAMUSCULAR | Status: DC | PRN

## 2012-11-24 MED ORDER — MIDAZOLAM HCL 2 MG/2ML IJ SOLN
INTRAMUSCULAR | Status: AC
  Filled 2012-11-24: qty 2

## 2012-11-24 MED ORDER — HYDROCHLOROTHIAZIDE 12.5 MG PO CAPS
12.5000 mg | ORAL_CAPSULE | Freq: Every day | ORAL | Status: DC
  Administered 2012-11-24 – 2012-11-25 (×2): 12.5 mg via ORAL
  Filled 2012-11-24 (×2): qty 1

## 2012-11-24 MED ORDER — VERAPAMIL HCL 2.5 MG/ML IV SOLN
INTRAVENOUS | Status: AC
  Filled 2012-11-24: qty 2

## 2012-11-24 MED ORDER — FUROSEMIDE 40 MG PO TABS
40.0000 mg | ORAL_TABLET | Freq: Every day | ORAL | Status: DC
  Administered 2012-11-24 – 2012-11-25 (×2): 40 mg via ORAL
  Filled 2012-11-24 (×2): qty 1

## 2012-11-24 MED ORDER — NITROGLYCERIN 0.4 MG SL SUBL: 0.4000 mg | SUBLINGUAL_TABLET | SUBLINGUAL | Status: DC | PRN

## 2012-11-24 MED ORDER — ATORVASTATIN CALCIUM 40 MG PO TABS
40.0000 mg | ORAL_TABLET | Freq: Every day | ORAL | Status: DC
  Administered 2012-11-24: 40 mg via ORAL
  Filled 2012-11-24 (×2): qty 1

## 2012-11-24 MED ORDER — ASPIRIN 81 MG PO CHEW
324.0000 mg | CHEWABLE_TABLET | ORAL | Status: DC
  Filled 2012-11-24: qty 4

## 2012-11-24 MED ORDER — ENOXAPARIN SODIUM 150 MG/ML ~~LOC~~ SOLN
190.0000 mg | SUBCUTANEOUS | Status: DC
  Filled 2012-11-24: qty 2

## 2012-11-24 MED ORDER — NITROGLYCERIN 1 MG/10 ML FOR IR/CATH LAB
INTRA_ARTERIAL | Status: AC
  Filled 2012-11-24: qty 10

## 2012-11-24 MED ORDER — SODIUM CHLORIDE 0.9 % IV SOLN: INTRAVENOUS | Status: AC

## 2012-11-24 MED ORDER — CARVEDILOL 25 MG PO TABS
25.0000 mg | ORAL_TABLET | Freq: Two times a day (BID) | ORAL | Status: DC
  Administered 2012-11-24 – 2012-11-25 (×3): 25 mg via ORAL
  Filled 2012-11-24 (×5): qty 1

## 2012-11-24 MED ORDER — ACETAMINOPHEN 325 MG PO TABS
650.0000 mg | ORAL_TABLET | ORAL | Status: DC | PRN
  Administered 2012-11-24: 650 mg via ORAL

## 2012-11-24 MED ORDER — ONDANSETRON HCL 4 MG/2ML IJ SOLN: 4.0000 mg | Freq: Four times a day (QID) | INTRAMUSCULAR | Status: DC | PRN

## 2012-11-24 MED ORDER — LIDOCAINE HCL (PF) 1 % IJ SOLN
INTRAMUSCULAR | Status: AC
  Filled 2012-11-24: qty 30

## 2012-11-24 MED ORDER — DIAZEPAM 5 MG PO TABS: 5.0000 mg | ORAL_TABLET | ORAL | Status: DC

## 2012-11-24 MED ORDER — SODIUM CHLORIDE 0.9 % IV SOLN
1.0000 mL/kg/h | INTRAVENOUS | Status: DC
  Administered 2012-11-24: 1 mL/kg/h via INTRAVENOUS

## 2012-11-24 MED ORDER — SODIUM CHLORIDE 0.9 % IJ SOLN
3.0000 mL | Freq: Two times a day (BID) | INTRAMUSCULAR | Status: DC
  Administered 2012-11-24 – 2012-11-25 (×4): 3 mL via INTRAVENOUS

## 2012-11-24 MED ORDER — ACETAMINOPHEN 325 MG PO TABS
650.0000 mg | ORAL_TABLET | ORAL | Status: DC | PRN
  Filled 2012-11-24: qty 2

## 2012-11-24 MED ORDER — ASPIRIN 81 MG PO CHEW: 324.0000 mg | CHEWABLE_TABLET | ORAL | Status: DC

## 2012-11-24 MED ORDER — SODIUM CHLORIDE 0.9 % IV SOLN: 250.0000 mL | INTRAVENOUS | Status: DC | PRN

## 2012-11-24 MED ORDER — LISINOPRIL 20 MG PO TABS
20.0000 mg | ORAL_TABLET | Freq: Every day | ORAL | Status: DC
  Administered 2012-11-24 – 2012-11-25 (×2): 20 mg via ORAL
  Filled 2012-11-24 (×2): qty 1

## 2012-11-24 NOTE — ED Notes (Signed)
Pt brought to ED by EMS with chest pain central and nonradiating.Pt was admitted in ED today and signed AMA 2 hrs back.

## 2012-11-24 NOTE — H&P (Signed)
Hospital Admission Note Date: 11/24/2012  Patient name: Mark Baker Medical record number: [id number] Date of birth: 01/12/1981 Age: 32 y.o. Gender: male PCP: Lorretta Harp, MD  Medical Service: Internal Medicine  Attending physician: Dr. Kem Kays    1st Contact: Dr. Heloise Beecham WUJWJ:[id number] 2nd Contact: Dr. Loistine Chance (11/24/12 only) Dr. Manson Passey NFAOZ:[id number]; [phone] After 5 pm or weekends: 1st Contact: Pager: [phone] 2nd Contact:  Pager: [phone]  Chief Complaint: Chest pain, Shortness of breath   History of Present Illness: 32 y.o morbidly obese male with PMH HTN, chronic systolic and diastolic heart failure, possible sleep apnea, controlled asthma presents with chest pain and shortness of breath.  Chest pain started at 5:30 PM today and at the time of Internal Medicine teaching service evaluation chest pain had resolved.  Patient initially came to the ED then left AMA around 9 PM and refused further treatment then later returned for chest pain complaints.  Chest pain was left sided 8/10, radiated to left shoulder and posterior neck which is new, like a "knife turning".  EMS gave him 1 NTG which helped but lead to a headache.  Nothing made his chest pain worse.  He had shortness of breath earlier today which is resolved.  Associated review of systems: +sweating during chest pain, weight stable in the 420s lbs, +dizziness.    Meds: Current Outpatient Rx  Name  Route  Sig  Dispense  Refill  . amLODipine (NORVASC) 5 MG tablet   Oral   Take 5 mg by mouth every evening.         . carvedilol (COREG) 12.5 MG tablet   Oral   Take 25 mg by mouth 2 (two) times daily with a meal.         . furosemide (LASIX) 40 MG tablet   Oral   Take 40 mg by mouth daily.         . hydrochlorothiazide (MICROZIDE) 12.5 MG capsule   Oral   Take 12.5 mg by mouth daily.         Marland Kitchen lisinopril (PRINIVIL,ZESTRIL) 20 MG tablet   Oral   Take 20 mg by mouth daily.         . sildenafil (VIAGRA) 50 MG tablet   Oral   Take 50 mg by mouth daily as needed for erectile dysfunction.           Allergies: Allergies as of 11/24/2012 - Review Complete 11/24/2012  Allergen Reaction Noted  . Nitroglycerin Other (See Comments) 11/19/2012  . Aspirin Hives, Itching, and Rash 10/21/2012   Past Medical History  Diagnosis Date  . Hypertension   . Asthma     childhood asthma no exacerbation since age 44   . Headache   . CHF (congestive heart failure)     systolic and diastolic    Past Surgical History  Procedure Laterality Date  . Anterior cruciate ligament repair      right   Family History  Problem Relation Age of Onset  . Hypertension Father   . Heart failure Father     paternal side of family   . Hypertension Sister   . Stroke Maternal Grandmother   . Hypertension      maternal side of family    History   Social History  . Marital Status: Married    Spouse Name: N/A    Number of Children: N/A  . Years of Education: N/A   Occupational History  . Not on file.   Social History Main Topics  .  Smoking status: Never Smoker   . Smokeless tobacco: Never Used  . Alcohol Use: Yes     Comment: seldom.  . Drug Use: No  . Sexually Active: Not on file   Other Topics Concern  . Not on file   Social History Narrative   Lives in [place] with wife of 4 years.  Works for [employer] at [employer].  1 son and 2 daughters.     Denies cigarettes. Drank 1 beer last night 11/22/12. Denies drugs           Review of Systems: General:denies fever or chills, appetite decreased, +sweating during chest pain, weight stable in the 420s lbs HEENT: +photophobia  CV:+chest pain (resolved), denies orthopnea Lungs: denies cough, +sob (resolved) Abdomen/GU:denies abdominal pain or nausea or vomiting or change in bowel (i.e constipation or diarrhea) or bladder habits or dysuria or blood in urine or stool  Extremities: denies leg swelling Skin: denies skin lesion Neuro:+dizziness   Physical Exam: HR 81, RR 18, 103/63  (72) QTc 480   Blood pressure 146/94, pulse 79, temperature 97.9 F (36.6 C), temperature source Oral, resp. rate 16, SpO2 97.00%. General:lying in bed, nad, alert and oriented x 3 HEENT: [place]/at, perrl b/l  CV: RRR no rubs, murmurs or gallops Lungs: ctab  Abdomen/GU: obese, normal bs, ntnd Extremities: no lower extremity edema, warm, no cyanosis  Neuro: CN 2-12 grossly intact, normal sensation and strength all 4 extremities, alert and oriented x 3  Psych: speaking softly, eyes closed, no eye contact, flat affect  Lab results: Basic Metabolic Panel:  Recent Labs  16/10/96 1804  NA 140  K 3.5  CL 99  CO2 29  GLUCOSE 93  BUN 13  CREATININE 1.07  CALCIUM 9.8   Liver Function Tests: No results found for this basename: AST, ALT, ALKPHOS, BILITOT, PROT, ALBUMIN,  in the last 72 hours  Recent Labs  11/24/12 0126  LIPASE 107*   CBC:  Recent Labs  11/23/12 1804  WBC 6.4  HGB 14.7  HCT 43.1  MCV 84.8  PLT 252   Cardiac Enzymes:  Recent Labs  11/24/12 0126  TROPONINI <0.30   BNP:  Recent Labs  11/23/12 1824 11/24/12 0126  PROBNP 100.1 113.7    Urine Drug Screen: Drugs of Abuse     Component Value Date/Time   LABOPIA POSITIVE* 11/08/2012 2056   COCAINSCRNUR NONE DETECTED 11/08/2012 2056   LABBENZ NONE DETECTED 11/08/2012 2056   AMPHETMU NONE DETECTED 11/08/2012 2056   THCU NONE DETECTED 11/08/2012 2056   LABBARB NONE DETECTED 11/08/2012 2056    Misc. Labs: UDS   Imaging results:  Dg Chest 2 View  11/23/2012  *RADIOLOGY REPORT*  Clinical Data: Hemoptysis.  Chest pain and weakness.  CHEST - 2 VIEW  Comparison: 11/08/2012  Findings: The heart is enlarged.  The vascularity is normal.  Lungs are clear.  No effusions.  No bony abnormalities.  IMPRESSION: Cardiomegaly.  No active disease.   Original Report Authenticated By: Paulina Fusi, M.D.     Other results: EKG: 89, NSR, normal axis and intervals, normal ST, +LVH   02/2012 echo  Study Conclusions  - Left  ventricle: The cavity size was severely dilated. Systolic function was severely reduced. The estimated ejection fraction was in the range of 25% to 30%. Diffuse hypokinesis. Features are consistent with a pseudonormal left ventricular filling pattern, with concomitant abnormal relaxation and increased filling pressure (grade 2 diastolic dysfunction). Doppler parameters are consistent with both elevated ventricular end-diastolic filling  pressure and elevated left atrial filling pressure. Increased echogenicity at apex, although probably a calcified chord,cannot completely exclude thrombus. - Mitral valve: Calcified annulus. Mildly thickened leaflets . Mild to moderate regurgitation. - Left atrium: The atrium was moderately dilated. - Atrial septum: No defect or patent foramen ovale was identified.   Assessment & Plan by Problem: 32 y.o morbidly obese male with PMH HTN, chronic systolic and diastolic heart failure, possible sleep apnea, controlled asthma presented to [facility] ED with chest pain and shortness of breath (both resolved).    1.  Chest pain likely atypical  -will work up for ACS. Timi risk score is low.   -CXR with cardiomegaly otherwise normal. POC troponin negative x 1. ProBNP 100.1  -Patient given 1 NTG SL via EMS which helped with chest pain but caused a headache -Recent note from Dr. Elease Hashimoto 11/19/12 states chest pain is atypical.  Currently he is too large to have a Myoview study per Dr. Elease Hashimoto.  He prefers weight loss prior to doing cardiac catheterization due to lower complication risk and better images to be obtained attained after weight loss. -Avoid NTG with Viagra and patient is allergic to Aspirin -Dr. Elease Hashimoto will need to be consulted in the am -trend cardiac enzymes.  Pending UDS  -Ordered repeat echo   2.  Systolic and diastolic CHF, chronic -Continue Lasix 40 mg qd, Coreg 25 mg bid, Lisinopril 20 mg qd -added Lipitor 40 mg qhs  -He follows with Dr. Elease Hashimoto  and recently saw 11/19/12  -See #1   3. Prolonged QTc -On tele QTc 480.  will repeat EKG in the am, Pending Mag level -Avoid QT prolonging drugs   4. HTN (hypertension) -intermittently uncontrolled  -Continue Lasix 40 mg qd, Coreg 25 mg bid, Lisinopril, HCTZ 12.5 mg qd, Norvasc 5 mg  -Patient is allergic to Aspirin -Monitor VS    5. Morbid obesity -Will encourage weight loss  -He may be a good candidate for medical nutrition counseling   6. History of asthma in childhood  -no exacerbation since age 20   7. Sleep apnea -sleep study scheduled 11/30/12   8. F/E/N -will monitor and replace electrolytes prn  -cardiac diet   9. DVT px  -Lovenox   Dispo: Disposition is deferred at this time, awaiting improvement of current medical problems. Anticipated discharge in approximately 2-3 day(s).   The patient does have a current PCP ([facility], Brien Few, MD), therefore will be requiring [facility] follow-up after discharge.   The patient does not have transportation limitations that hinder transportation to clinic appointments.  SignedAnnett Gula [phone] 11/24/2012, 2:49 AM

## 2012-11-24 NOTE — Consult Note (Signed)
CARDIOLOGY CONSULT NOTE   Patient ID: Mark Baker MRN: [id number] DOB/AGE: 1981/08/09 31 y.o.  Admit date: 11/24/2012  Primary Physician   Lorretta Harp, MD Primary Cardiologist [place]  Reason for Consultation   Chest pain  Mark Baker is a 32 y.o. male with no history of CAD but has current medical problems of CHF, HTN, Chronic systolic and diastolic dysfunction, and morbid obesity (425lbs) who presents with SOB, chest pain radiating into his shoulder and posterior neck.    Mark Baker is currently being seen by Dr. Elease Hashimoto.  His last visit was on 11/19/12 for atypical CP and it was decided that his weight was too much for a Myoview and Cath at that time until he has lost more weight. His last echo in 07/13 showed severe LV dilation, EF 25-30% with diffuse hypokinesis, grad 2 diastolic dysfunction, and mild to mod MR.  Since his last clinic visit earlier this month he has been doing fairly well.  He has been playing basketball without getting CP and cutting back on his salt intake.  Around 1700 on 11/23/12 he developed some hemoptysis while driving that resolved quickly.  Denies any trauma or SOB at the time.  Then at 1730 he started getting a sharp pain and  tightness feeling in his mid-sternal chest radiating to his left shoulder and posterior neck along with some diaphoresis and nausea while driving.   He drove to the ED and was given 1000 mg of tylenol that did not help.   After arriving in the ED he left AMA in order to pick up his kids.  Later that night his CP resolved and then  the same CP returned, again with diaphoresis nausea and syncope. He woke up on the floor and his wife called EMS.  EMS gave him NTG x1 and his symptoms resolved and he developed a headache. Denies fever, productive cough, abd pain, n/v, change in bowels habits, and palpitations.  Currently he denies any CP, SOB, diaphoresis, or nausea.  Headache still remains  Mr. Ferre is able to play basketball three time per week  without getting CP or any associated symptoms.  He did have asthma as a child but has not had any symptoms since he was 10 and doesn't feel like this SOB is the same feeling.  He has also been told that this CP could be do to anxiety or some kind of panic attack but he states that it doesn't feel like he is that anxious.   Past Medical History  Diagnosis Date  . Hypertension   . Asthma     childhood asthma no exacerbation since age 26   . Headache   . CHF (congestive heart failure)     systolic and diastolic      Past Surgical History  Procedure Laterality Date  . Anterior cruciate ligament repair      right    Allergies  Allergen Reactions  . Nitroglycerin Other (See Comments)    Patient takes Viagra, so can't take nitro.   . Aspirin Hives, Itching and Rash    I have reviewed the patient's current medications . amLODipine  5 mg Oral QPM  . atorvastatin  40 mg Oral q1800  . carvedilol  25 mg Oral BID WC  . enoxaparin (LOVENOX) injection  100 mg Subcutaneous Q24H  . furosemide  40 mg Oral Daily  . hydrochlorothiazide  12.5 mg Oral Daily  . lisinopril  20 mg Oral Daily  . sodium chloride  3  mL Intravenous Q12H     sodium chloride, acetaminophen, acetaminophen, nitroGLYCERIN, ondansetron (ZOFRAN) IV, ondansetron (ZOFRAN) IV, sodium chloride  Prior to Admission medications   Medication Sig Start Date End Date Taking? Authorizing Provider  amLODipine (NORVASC) 5 MG tablet Take 5 mg by mouth every evening.   Yes Historical Provider, MD  carvedilol (COREG) 12.5 MG tablet Take 25 mg by mouth 2 (two) times daily with a meal.   Yes Historical Provider, MD  furosemide (LASIX) 40 MG tablet Take 40 mg by mouth daily.   Yes Historical Provider, MD  hydrochlorothiazide (MICROZIDE) 12.5 MG capsule Take 12.5 mg by mouth daily.   Yes Historical Provider, MD  lisinopril (PRINIVIL,ZESTRIL) 20 MG tablet Take 20 mg by mouth daily.   Yes Historical Provider, MD  sildenafil (VIAGRA) 50 MG  tablet Take 50 mg by mouth daily as needed for erectile dysfunction.   Yes Historical Provider, MD     History   Social History  . Marital Status: Married    Spouse Name: N/A    Number of Children: N/A  . Years of Education: N/A   Occupational History  . Not on file.   Social History Main Topics  . Smoking status: Never Smoker   . Smokeless tobacco: Never Used  . Alcohol Use: Yes     Comment: seldom.  . Drug Use: No  . Sexually Active: Not on file   Other Topics Concern  . Not on file   Social History Narrative   Lives in [place] with wife of 4 years.  Works for [employer] at [employer].  1 son and 2 daughters.     Denies cigarettes. Drank 1 beer last night 11/22/12. Denies drugs           No family status information on file.   Family History  Problem Relation Age of Onset  . Hypertension Father   . Heart failure Father     paternal side of family   . Hypertension Sister   . Stroke Maternal Grandmother   . Hypertension      maternal side of family      ROS:  Full 14 point review of systems complete and found to be negative unless listed above.  Physical Exam: Blood pressure 143/87, pulse 69, temperature 97.7 F (36.5 C), temperature source Oral, resp. rate 16, height 6\' 3"  (1.905 m), weight 425 lb (192.779 kg), SpO2 96.00%.  General: Well developed, morbidly obese, male in no acute distress Head: Eyes PERRLA, No xanthomas.   Normocephalic and atraumatic, oropharynx without edema or exudate. Dentition:  Lungs:  Heart: HRRR S1 S2, no rub/gallop or murmur. pulses are 2+ extrem.   Neck: No carotid bruits. No lymphadenopathy.  JVD. Abdomen: Bowel sounds present, abdomen soft and non-tender without masses or hernias noted. Msk:  No spine or cva tenderness. No weakness, no joint deformities or effusions. Extremities: No clubbing or cyanosis.  edema.  Neuro: Alert and oriented X 3. No focal deficits noted. Psych:  Good affect, responds appropriately Skin: No rashes or lesions  noted.  Labs:   Lab Results  Component Value Date   WBC 6.4 11/23/2012   HGB 14.7 11/23/2012   HCT 43.1 11/23/2012   MCV 84.8 11/23/2012   PLT 252 11/23/2012   No results found for this basename: INR,  in the last 72 hours  Recent Labs Lab 11/23/12 1804  NA 140  K 3.5  CL 99  CO2 29  BUN 13  CREATININE 1.07  CALCIUM  9.8  GLUCOSE 93   Magnesium  Date Value Range Status  11/24/2012 2.0  1.5 - 2.5 mg/dL Final    Recent Labs  16/10/96 0126  TROPONINI <0.30    Recent Labs  11/23/12 1858 11/24/12 0142  TROPIPOC 0.02 0.02   Pro B Natriuretic peptide (BNP)  Date/Time Value Range Status  11/24/2012  1:26 AM 113.7  0 - 125 pg/mL Final  11/23/2012  6:24 PM 100.1  0 - 125 pg/mL Final   Lab Results  Component Value Date   CHOL 180 11/09/2012   HDL 30* 11/09/2012   LDLCALC 109* 11/09/2012   TRIG 203* 11/09/2012   No results found for this basename: DDIMER   Lipase  Date/Time Value Range Status  11/24/2012  1:26 AM 107* 11 - 59 U/L Final   TSH  Date/Time Value Range Status  02/10/2012  3:58 PM 0.618  0.350 - 4.500 uIU/mL Final   No results found for this basename: VitaminB12, Folate, Ferritin, TIBC, Iron, reticctpct    Echo:   ECG:  NSR without acute change  Radiology:  Dg Chest 2 View  11/23/2012  *RADIOLOGY REPORT*  Clinical Data: Hemoptysis.  Chest pain and weakness.  CHEST - 2 VIEW  Comparison: 11/08/2012  Findings: The heart is enlarged.  The vascularity is normal.  Lungs are clear.  No effusions.  No bony abnormalities.  IMPRESSION: Cardiomegaly.  No active disease.   Original Report Authenticated By: Paulina Fusi, M.D.     ASSESSMENT AND PLAN:   The patient was seen today by Dr Graciela Husbands, the patient evaluated and the data reviewed.  Mr. Matsuo has been having this atypical chest pain for some time now.   His symptoms did resolve after receiving NTG x1.  His last echo in 07/13 showed severe LV dilation, EF 25-30% with diffuse hypokinesis, grad 2 diastolic dysfunction,  and mild to mod MR.  Troponin neg x3.  Lipase was elevated. Pro-bnp wnl.  Cardiology: -continue BP meds -continue lasix -continue statin -start IV fluid in preparation for heart cath -Echo today -Heart Cath today -Check LFT's and amylase in the setting of elevated lipase.  Principal Problem:   Chest pain Active Problems:   Morbid obesity   Systolic and diastolic CHF, chronic   HTN (hypertension)   Prolonged QT interval   Signed:  Henningsgaard, Herby Abraham, PA-C 11/24/2012 7:55 AM Beeper [phone]  Pt with recurrent visits for chest pain in the setting of cardiomyopathy of unknown but > iyear duration Atypical features include non exertional and duration; but otherwise described as squeezing and radiating to neck and shoulders We have discussed the role of cath to clarify mechanism of cardiomyopathy and chest pain  He is agreeable  Will get echo to look at RV  andn LA size and function

## 2012-11-24 NOTE — CV Procedure (Signed)
   Cardiac Catheterization Procedure Note  Name: Mark Baker MRN: [id number] DOB: 30-Dec-1980  Procedure: Left Heart Cath, Selective Coronary Angiography, LV angiography  Indication: 31 yo BM with history of nonischemic cardiomyopathy, morbid obesity, and HTN presents with symptoms of recurrent chest pain.   Procedural Details: The right wrist was prepped, draped, and anesthetized with 1% lidocaine. Using the modified Seldinger technique, a 5 French sheath was introduced into the right radial artery. 3 mg of verapamil was administered through the sheath, weight-based unfractionated heparin was administered intravenously. Standard Judkins catheters were used for selective coronary angiography and left ventriculography. Catheter exchanges were performed over an exchange length guidewire. There were no immediate procedural complications. A TR band was used for radial hemostasis at the completion of the procedure.  The patient was transferred to the post catheterization recovery area for further monitoring.  Procedural Findings: Hemodynamics: AO 124/73 mean 89 mm Hg LV 123/14  Coronary angiography: Coronary dominance: right  Left mainstem: Normal.  Left anterior descending (LAD): Normal  Left circumflex (LCx): Normal.  Right coronary artery (RCA): Very large ectatic vessel. No CAD.  Left ventriculography: Left ventricular systolic function is abnormal. There is global hypokinesis with EF approximately 40%.   Final Conclusions:   1. Normal coronary anatomy. 2. Moderate LV dysfunction. 3. Normal LV filling pressures.  Recommendations: continue medical therapy.  Theron Arista [facility] 11/24/2012, 1:47 PM

## 2012-11-24 NOTE — H&P (Signed)
INTERNAL MEDICINE TEACHING SERVICE Attending Admission Note  Date: 11/24/2012  Patient name: Mark Baker  Medical record number: [id number]  Date of birth: [birth year]    I have seen and evaluated Mark Baker and discussed their care with the Residency Team.  31 yr. Old AAM w/ morbid obesity Body mass index is 53.12 kg/(m^2)., chronic systolic/diastolic heart failure, asthma, presented with CP. He states the pain was left sided and radiated to his left upper back and left shoulder, associated with diaphoresis, and some SOB. He states the CP resolved with NTG, but left him with a headache.  This morning he is CP free. Trop I negative x 2.  EKG on admission without ST changes.   Physical Exam: Blood pressure 152/92, pulse 77, temperature 97.7 F (36.5 C), temperature source Oral, resp. rate 16, height 6\' 3"  (1.905 m), weight 425 lb (192.779 kg), SpO2 96.00%.  General: Vital signs reviewed and noted. Well-developed, well-nourished, in no acute distress; alert, appropriate and cooperative throughout examination.  Head: Normocephalic, atraumatic.  Eyes: PERRL, EOMI, No signs of anemia or jaundince.  Nose: Mucous membranes moist, not inflammed, nonerythematous.  Throat: Oropharynx nonerythematous, no exudate appreciated.   Neck: No deformities, masses, or tenderness noted.Supple, No carotid Bruits, no JVD.  Lungs:  Normal respiratory effort. Clear to auscultation BL without crackles or wheezes.  Heart: RRR. S1 and S2 normal without gallop, murmur, or rubs.  Abdomen:  BS normoactive. Soft, Nondistended, non-tender.  No masses or organomegaly.  Extremities: No pretibial edema.  Neurologic: A&O X3, CN II - XII are grossly intact. Motor strength is 5/5 in the all 4 extremities, Sensations intact to light touch, Cerebellar signs negative.  Skin: No visible rashes, scars.    Lab results: Results for orders placed during the hospital encounter of 11/24/12 (from the past 24 hour(s))  PRO B  NATRIURETIC PEPTIDE     Status: None   Collection Time    11/24/12  1:26 AM      Result Value Range   Pro B Natriuretic peptide (BNP) 113.7  0 - 125 pg/mL  TROPONIN I     Status: None   Collection Time    11/24/12  1:26 AM      Result Value Range   Troponin I <0.30  <0.30 ng/mL  LIPASE, BLOOD     Status: Abnormal   Collection Time    11/24/12  1:26 AM      Result Value Range   Lipase 107 (*) 11 - 59 U/L  MAGNESIUM     Status: None   Collection Time    11/24/12  1:26 AM      Result Value Range   Magnesium 2.0  1.5 - 2.5 mg/dL  POCT I-STAT TROPONIN I     Status: None   Collection Time    11/24/12  1:42 AM      Result Value Range   Troponin i, poc 0.02  0.00 - 0.08 ng/mL   Comment 3           URINE RAPID DRUG SCREEN (HOSP PERFORMED)     Status: None   Collection Time    11/24/12  3:55 AM      Result Value Range   Opiates NONE DETECTED  NONE DETECTED   Cocaine NONE DETECTED  NONE DETECTED   Benzodiazepines NONE DETECTED  NONE DETECTED   Amphetamines NONE DETECTED  NONE DETECTED   Tetrahydrocannabinol NONE DETECTED  NONE DETECTED   Barbiturates NONE DETECTED  NONE DETECTED  TROPONIN I     Status: None   Collection Time    11/24/12  8:28 AM      Result Value Range   Troponin I <0.30  <0.30 ng/mL  PROTIME-INR     Status: None   Collection Time    11/24/12 12:40 PM      Result Value Range   Prothrombin Time 13.3  11.6 - 15.2 seconds   INR 1.02  0.00 - 1.49    Imaging results:  Dg Chest 2 View  11/23/2012  *RADIOLOGY REPORT*  Clinical Data: Hemoptysis.  Chest pain and weakness.  CHEST - 2 VIEW  Comparison: 11/08/2012  Findings: The heart is enlarged.  The vascularity is normal.  Lungs are clear.  No effusions.  No bony abnormalities.  IMPRESSION: Cardiomegaly.  No active disease.   Original Report Authenticated By: Paulina Fusi, M.D.      Assessment and Plan: I agree with the formulated Assessment and Plan with the following changes: 31 yr. Old AAM w/ morbid obesity Body  mass index is 53.12 kg/(m^2)., chronic systolic/diastolic heart failure, asthma, presented with CP. 1) CP: Some atypical component, but the fact that it improved with NTG is concerning.  Cardiology consulted due to underlying cardiomyopathy and a LHC is reasonable at this time to determine if there is obstructive CAD. 2) Probable OSA: he will need sleep study as scheduled. 3) Chronic systolic/diastolic HF: Compensated, LHC to determine if obstructive CAD contributing to etiology, ISCM vs NISCM. -rest per resident note.    The treatment plan was discussed in detail with the patient.  Alternatives to treatment, side effects, risks and benefits, and complications were discussed with the patient. Informed consent was obtained. The patient agrees to proceed with the current treatment plan.  Jonah Blue, [place] 4/15/20141:42 PM

## 2012-11-24 NOTE — Progress Notes (Signed)
Brief Nutrition Note:  RD pulled to pt for Malnutrition Screening Tool report.  Pt states that recent weight loss has been intentional. Pt denies any nutrition needs at this time.   Body mass index is 53.12 kg/(m^2). Obesity class 3, extreme. Some weight loss is desirable for this pt.   No nutrition interventions warranted at this time. Please consult as needed.   Clarene Duke RD, LDN Pager [phone] After Hours pager [phone]

## 2012-11-24 NOTE — ED Provider Notes (Signed)
Medical screening examination/treatment/procedure(s) were performed by non-physician practitioner and as supervising physician I was immediately available for consultation/collaboration.   Gwyneth Sprout, MD 11/24/12 2135

## 2012-11-24 NOTE — Progress Notes (Signed)
Med giving in cath lab 2 mg versed, 50 mcg fentanyl.  Per report. EF per cath lab 35-45%

## 2012-11-24 NOTE — Plan of Care (Signed)
Problem: Phase I Progression Outcomes Goal: EF % per last Echo/documented,Core Reminder form on chart Outcome: Completed/Met Date Met:  11/24/12 40%

## 2012-11-24 NOTE — ED Provider Notes (Signed)
History     CSN: [id number]  Arrival date & time 11/24/12  0016   First MD Initiated Contact with Patient 11/24/12 0018      Chief Complaint  Patient presents with  . Chest Pain    (Consider location/radiation/quality/duration/timing/severity/associated sxs/prior treatment) HPI  Mark Baker is a 32 y.o. male with past medical history of CHF, and asthma complaining of central chest pain shortness of breath. Patient signed out AMA several hours ago and now returns via EMS. She states that his pain is not worsening worsening shortness of breath he states that he had to go and pick up his daughter and now he is returning for treatment. Denies nausea vomiting diarrhea, abdominal pain, headache.    Past Medical History  Diagnosis Date  . Hypertension   . Asthma     childhood asthma  . Headache   . CHF (congestive heart failure)     Past Surgical History  Procedure Laterality Date  . Anterior cruciate ligament repair      Family History  Problem Relation Age of Onset  . Hypertension Father   . Heart failure Father   . Hypertension Sister   . Stroke Maternal Grandmother     History  Substance Use Topics  . Smoking status: Never Smoker   . Smokeless tobacco: Never Used  . Alcohol Use: Yes     Comment: seldom.      Review of Systems  Constitutional: Negative for fever.  Respiratory: Positive for shortness of breath.   Cardiovascular: Positive for chest pain.  Gastrointestinal: Negative for nausea, vomiting, abdominal pain and diarrhea.  All other systems reviewed and are negative.    Allergies  Nitroglycerin and Aspirin  Home Medications   Current Outpatient Rx  Name  Route  Sig  Dispense  Refill  . amLODipine (NORVASC) 5 MG tablet   Oral   Take 5 mg by mouth every evening.         . carvedilol (COREG) 12.5 MG tablet   Oral   Take 25 mg by mouth 2 (two) times daily with a meal.         . furosemide (LASIX) 40 MG tablet   Oral   Take 40 mg by  mouth daily.         . hydrochlorothiazide (MICROZIDE) 12.5 MG capsule   Oral   Take 12.5 mg by mouth daily.         Marland Kitchen lisinopril (PRINIVIL,ZESTRIL) 20 MG tablet   Oral   Take 20 mg by mouth daily.         . sildenafil (VIAGRA) 50 MG tablet   Oral   Take 50 mg by mouth daily as needed for erectile dysfunction.           BP 136/75  Pulse 64  Temp(Src) 98.2 F (36.8 C) (Oral)  Resp 18  Ht 6\' 3"  (1.905 m)  Wt 424 lb 9.6 oz (192.597 kg)  BMI 53.07 kg/m2  SpO2 98%  Physical Exam  Nursing note and vitals reviewed. Constitutional: He is oriented to person, place, and time. He appears well-developed and well-nourished. No distress.  Morbidly obese  HENT:  Head: Normocephalic and atraumatic.  Mouth/Throat: Oropharynx is clear and moist.  Eyes: Conjunctivae and EOM are normal. Pupils are equal, round, and reactive to light.  Neck: Normal range of motion.  Cardiovascular: Normal rate, regular rhythm, normal heart sounds and intact distal pulses.   Pulmonary/Chest: Effort normal and breath sounds normal. No stridor. No respiratory  distress. He has no wheezes. He has no rales. He exhibits no tenderness.  Abdominal: Soft. Bowel sounds are normal. He exhibits no distension and no mass. There is no tenderness. There is no rebound and no guarding.  Musculoskeletal: Normal range of motion. He exhibits edema.  Neurological: He is alert and oriented to person, place, and time.  Skin: Skin is warm.  Psychiatric: He has a normal mood and affect.    ED Course  Procedures (including critical care time)  Labs Reviewed  PRO B NATRIURETIC PEPTIDE   Dg Chest 2 View  11/23/2012  *RADIOLOGY REPORT*  Clinical Data: Hemoptysis.  Chest pain and weakness.  CHEST - 2 VIEW  Comparison: 11/08/2012  Findings: The heart is enlarged.  The vascularity is normal.  Lungs are clear.  No effusions.  No bony abnormalities.  IMPRESSION: Cardiomegaly.  No active disease.   Original Report Authenticated  By: Paulina Fusi, M.D.      Date: 11/25/2012 06:43  Rate: 67  Rhythm: normal sinus rhythm  QRS Axis: normal  Intervals: normal   ST/T Wave abnormalities: nonspecific ST/T changes  Conduction Disutrbances:none  Narrative Interpretation:   Old EKG Reviewed: none available     1. Systolic and diastolic CHF, chronic   2. Chest pain   3. SOB (shortness of breath)   4. Prolonged QT interval   5. HTN (hypertension)   6. Morbid obesity       MDM   Mark Baker is a 32 y.o. male with chest pain and shortness of breath. EKG is nonischemic. Patient admitted to teaching service under the care of Dr. Bosie Clos.     Filed Vitals:   11/25/12 0623 11/25/12 0723 11/25/12 0949 11/25/12 1124  BP: 138/89 148/90 129/82 136/75  Pulse: 62 72 72 64  Temp:  97.8 F (36.6 C) 98.1 F (36.7 C) 98.2 F (36.8 C)  TempSrc:  Oral Oral Oral  Resp:  20 20 18   Height:      Weight:      SpO2:  94% 98% 98%       Wynetta Emery, PA-C 11/26/12 [phone]

## 2012-11-24 NOTE — H&P (View-Only) (Signed)
 CARDIOLOGY CONSULT NOTE   Patient ID: Mark Baker MRN: [id number] DOB/AGE: 33/05/1981 31 y.o.  Admit date: 11/24/2012  Primary Physician   NIU, XILIN, MD Primary Cardiologist Nahser  Reason for Consultation   Chest pain  HPI:Mark Baker is a 31 y.o. male with no history of CAD but has current medical problems of CHF, HTN, Chronic systolic and diastolic dysfunction, and morbid obesity (425lbs) who presents with SOB, chest pain radiating into his shoulder and posterior neck.    Mark Baker is currently being seen by Dr. Nahser.  His last visit was on 11/19/12 for atypical CP and it was decided that his weight was too much for a Myoview and Cath at that time until he has lost more weight. His last echo in 07/13 showed severe LV dilation, EF 25-30% with diffuse hypokinesis, grad 2 diastolic dysfunction, and mild to mod MR.  Since his last clinic visit earlier this month he has been doing fairly well.  He has been playing basketball without getting CP and cutting back on his salt intake.  Around 1700 on 11/23/12 he developed some hemoptysis while driving that resolved quickly.  Denies any trauma or SOB at the time.  Then at 1730 he started getting a sharp pain and  tightness feeling in his mid-sternal chest radiating to his left shoulder and posterior neck along with some diaphoresis and nausea while driving.   He drove to the ED and was given 1000 mg of tylenol that did not help.   After arriving in the ED he left AMA in order to pick up his kids.  Later that night his CP resolved and then  the same CP returned, again with diaphoresis nausea and syncope. He woke up on the floor and his wife called EMS.  EMS gave him NTG x1 and his symptoms resolved and he developed a headache. Denies fever, productive cough, abd pain, n/v, change in bowels habits, and palpitations.  Currently he denies any CP, SOB, diaphoresis, or nausea.  Headache still remains  Mark Baker is able to play basketball three time per week  without getting CP or any associated symptoms.  He did have asthma as a child but has not had any symptoms since he was 10 and doesn't feel like this SOB is the same feeling.  He has also been told that this CP could be do to anxiety or some kind of panic attack but he states that it doesn't feel like he is that anxious.   Past Medical History  Diagnosis Date  . Hypertension   . Asthma     childhood asthma no exacerbation since age 13   . Headache   . CHF (congestive heart failure)     systolic and diastolic      Past Surgical History  Procedure Laterality Date  . Anterior cruciate ligament repair      right    Allergies  Allergen Reactions  . Nitroglycerin Other (See Comments)    Patient takes Viagra, so can't take nitro.   . Aspirin Hives, Itching and Rash    I have reviewed the patient's current medications . amLODipine  5 mg Oral QPM  . atorvastatin  40 mg Oral q1800  . carvedilol  25 mg Oral BID WC  . enoxaparin (LOVENOX) injection  100 mg Subcutaneous Q24H  . furosemide  40 mg Oral Daily  . hydrochlorothiazide  12.5 mg Oral Daily  . lisinopril  20 mg Oral Daily  . sodium chloride  3   mL Intravenous Q12H     sodium chloride, acetaminophen, acetaminophen, nitroGLYCERIN, ondansetron (ZOFRAN) IV, ondansetron (ZOFRAN) IV, sodium chloride  Prior to Admission medications   Medication Sig Start Date End Date Taking? Authorizing Provider  amLODipine (NORVASC) 5 MG tablet Take 5 mg by mouth every evening.   Yes Historical Provider, MD  carvedilol (COREG) 12.5 MG tablet Take 25 mg by mouth 2 (two) times daily with a meal.   Yes Historical Provider, MD  furosemide (LASIX) 40 MG tablet Take 40 mg by mouth daily.   Yes Historical Provider, MD  hydrochlorothiazide (MICROZIDE) 12.5 MG capsule Take 12.5 mg by mouth daily.   Yes Historical Provider, MD  lisinopril (PRINIVIL,ZESTRIL) 20 MG tablet Take 20 mg by mouth daily.   Yes Historical Provider, MD  sildenafil (VIAGRA) 50 MG  tablet Take 50 mg by mouth daily as needed for erectile dysfunction.   Yes Historical Provider, MD     History   Social History  . Marital Status: Married    Spouse Name: N/A    Number of Children: N/A  . Years of Education: N/A   Occupational History  . Not on file.   Social History Main Topics  . Smoking status: Never Smoker   . Smokeless tobacco: Never Used  . Alcohol Use: Yes     Comment: seldom.  . Drug Use: No  . Sexually Active: Not on file   Other Topics Concern  . Not on file   Social History Narrative   Lives in [place] with wife of 4 years.  Works for [employer] at [employer].  1 son and 2 daughters.     Denies cigarettes. Drank 1 beer last night 11/22/12. Denies drugs           No family status information on file.   Family History  Problem Relation Age of Onset  . Hypertension Father   . Heart failure Father     paternal side of family   . Hypertension Sister   . Stroke Maternal Grandmother   . Hypertension      maternal side of family      ROS:  Full 14 point review of systems complete and found to be negative unless listed above.  Physical Exam: Blood pressure 143/87, pulse 69, temperature 97.7 F (36.5 C), temperature source Oral, resp. rate 16, height 6' 3" (1.905 m), weight 425 lb (192.779 kg), SpO2 96.00%.  General: Well developed, morbidly obese, male in no acute distress Head: Eyes PERRLA, No xanthomas.   Normocephalic and atraumatic, oropharynx without edema or exudate. Dentition:  Lungs:  Heart: HRRR S1 S2, no rub/gallop or murmur. pulses are 2+ extrem.   Neck: No carotid bruits. No lymphadenopathy.  JVD. Abdomen: Bowel sounds present, abdomen soft and non-tender without masses or hernias noted. Msk:  No spine or cva tenderness. No weakness, no joint deformities or effusions. Extremities: No clubbing or cyanosis.  edema.  Neuro: Alert and oriented X 3. No focal deficits noted. Psych:  Good affect, responds appropriately Skin: No rashes or lesions  noted.  Labs:   Lab Results  Component Value Date   WBC 6.4 11/23/2012   HGB 14.7 11/23/2012   HCT 43.1 11/23/2012   MCV 84.8 11/23/2012   PLT 252 11/23/2012   No results found for this basename: INR,  in the last 72 hours  Recent Labs Lab 11/23/12 1804  NA 140  K 3.5  CL 99  CO2 29  BUN 13  CREATININE 1.07  CALCIUM   9.8  GLUCOSE 93   Magnesium  Date Value Range Status  11/24/2012 2.0  1.5 - 2.5 mg/dL Final    Recent Labs  11/24/12 0126  TROPONINI <0.30    Recent Labs  11/23/12 1858 11/24/12 0142  TROPIPOC 0.02 0.02   Pro B Natriuretic peptide (BNP)  Date/Time Value Range Status  11/24/2012  1:26 AM 113.7  0 - 125 pg/mL Final  11/23/2012  6:24 PM 100.1  0 - 125 pg/mL Final   Lab Results  Component Value Date   CHOL 180 11/09/2012   HDL 30* 11/09/2012   LDLCALC 109* 11/09/2012   TRIG 203* 11/09/2012   No results found for this basename: DDIMER   Lipase  Date/Time Value Range Status  11/24/2012  1:26 AM 107* 11 - 59 U/L Final   TSH  Date/Time Value Range Status  02/10/2012  3:58 PM 0.618  0.350 - 4.500 uIU/mL Final   No results found for this basename: VitaminB12, Folate, Ferritin, TIBC, Iron, reticctpct    Echo:   ECG:  NSR without acute change  Radiology:  Dg Chest 2 View  11/23/2012  *RADIOLOGY REPORT*  Clinical Data: Hemoptysis.  Chest pain and weakness.  CHEST - 2 VIEW  Comparison: 11/08/2012  Findings: The heart is enlarged.  The vascularity is normal.  Lungs are clear.  No effusions.  No bony abnormalities.  IMPRESSION: Cardiomegaly.  No active disease.   Original Report Authenticated By: Mark Shogry, M.D.     ASSESSMENT AND PLAN:   The patient was seen today by Dr Sadey Yandell, the patient evaluated and the data reviewed.  Mr. Haberer has been having this atypical chest pain for some time now.   His symptoms did resolve after receiving NTG x1.  His last echo in 07/13 showed severe LV dilation, EF 25-30% with diffuse hypokinesis, grad 2 diastolic dysfunction,  and mild to mod MR.  Troponin neg x3.  Lipase was elevated. Pro-bnp wnl.  Cardiology: -continue BP meds -continue lasix -continue statin -start IV fluid in preparation for heart cath -Echo today -Heart Cath today -Check LFT's and amylase in the setting of elevated lipase.  Principal Problem:   Chest pain Active Problems:   Morbid obesity   Systolic and diastolic CHF, chronic   HTN (hypertension)   Prolonged QT interval   Signed:  Henningsgaard, Bradley, Student-PA  Rhonda Barrett, PA-C 11/24/2012 7:55 AM Beeper [phone]  Pt with recurrent visits for chest pain in the setting of cardiomyopathy of unknown but > iyear duration Atypical features include non exertional and duration; but otherwise described as squeezing and radiating to neck and shoulders We have discussed the role of cath to clarify mechanism of cardiomyopathy and chest pain  He is agreeable  Will get echo to look at RV  andn LA size and function   

## 2012-11-24 NOTE — Progress Notes (Signed)
Medical Student Daily Progress Note   Subjective:    Interval Events:  No acute events overnight.  Patient does not complain of chest pain, shortness of breath or dizziness this morning.    Objective:    Vital Signs:   Temp:  [97.7 F (36.5 C)-99.4 F (37.4 C)]      97.7 F (36.5 C) (04/15 0332) Pulse Rate:  [69-86]         69 (04/15 0626) Resp:  [4-22]          16 (04/15 0332) BP: (120-152)/(68-94)        143/87 mmHg (04/15 0626) SpO2:  [94 %-98 %]         96 % (04/15 0332) Weight:  [192.779 kg (425 lb)]       192.779 kg (425 lb) (04/15 0332) Last BM Date: 11/23/12   Intake/Output:   Intake/Output Summary (Last 24 hours) at 11/24/12 0924 Last data filed at 11/24/12 0900  Gross per 24 hour  Intake    603 ml  Output    300 ml  Net    303 ml     Net since admission:  + 303 mL  Physical Exam: GENERAL: obese, well developed, well nourished; no acute distress HEAD: atraumatic, normocephalic EYES: sclera anicteric; normal conjunctiva NOSE: no erythema or drainage MOUTH/THROAT: oropharynx clear, moist mucous membranes NECK: supple LYMPH: no cervical or supraclavicular lymphadenopathy LUNGS: clear to auscultation bilaterally, normal work of breathing HEART: normal rate and regular rhythm; normal S1 and S2 without S3 or S4; no murmurs, rubs, or clicks ABDOMEN: soft, non-tender, normal bowel sounds CRANIAL NERVES: extra occular muscles are intact;hearing is equal bilaterally, SKIN: warm, dry, intact, normal turgor, no rashes EXTREMITIES: no peripheral edema, clubbing, or cyanosis    Labs: Basic Metabolic Panel:  Recent Labs Lab 11/23/12 1804 11/24/12 0126  NA 140  --   K 3.5  --   CL 99  --   CO2 29  --   GLUCOSE 93  --   BUN 13  --   CREATININE 1.07  --   CALCIUM 9.8  --   MG  --  2.0    Recent Labs Lab 11/24/12 0126  LIPASE 107*   CBC:  Recent Labs Lab 11/23/12 1804  WBC 6.4  HGB 14.7  HCT 43.1  MCV 84.8  PLT 252    Cardiac  Enzymes:  Recent Labs Lab 11/24/12 0126  TROPONINI <0.30    BNP (last 3 results):  Recent Labs  11/23/12 1824 11/24/12 0126  PROBNP 100.1 113.7       Medications:    Scheduled Medications: . amLODipine  5 mg Oral QPM  . atorvastatin  40 mg Oral q1800  . carvedilol  25 mg Oral BID WC  . enoxaparin (LOVENOX) injection  100 mg Subcutaneous Q24H  . furosemide  40 mg Oral Daily  . hydrochlorothiazide  12.5 mg Oral Daily  . lisinopril  20 mg Oral Daily  . sodium chloride  3 mL Intravenous Q12H     PRN Medications: sodium chloride, acetaminophen, acetaminophen, nitroGLYCERIN, ondansetron (ZOFRAN) IV, ondansetron (ZOFRAN) IV, sodium chloride    Assessment/ Plan:     Mr. Kerrigan is a 32 y.o obese male with a history of chronic systolic and diastolic heart failure, hypertension,  Well controlled asthma presented to the ED with chest pain, shortness of breath and dizziness.  1. Non-anginal chest pain - Differential includes ACS, PE, Costochondral, CHF exacerbation and esophageal spasm.  ACS has been  ruled out given troponins negative x 3 and no findings on EKG.  Less likely PE given that he does not have any risk factors and geneva score = 3 and Wells = 0.  Could be costochondral given patients obesity however given that pain and come and gone intermittently with no obvious cause and that it is not reproducible on exam this is not likely.  This could be an exacerbation of his heart failure given that he has both systolic and diastolic dysfunction which could result in demand ischemia.  Could less likely be esophageal spasm given that pain is intermittent but this would not explain is shortness of breath or dizziness episodes.  Cardiology saw him last week on 11/19/12 and were considering starting him on Hydralazine if persistent LV dysfunction was seen.  Plan: -Repeat EKG this morning -Cardiology consulted -Ordered will do Transthoracic Echo this morning   2. Chronic Systolic and  diastolic CHF - Patient will get a repeat 2D TTE today to evaluate heart function.  Plan: -Monitor VS -Continue Furosemide 40 mg daily, Carvedilol 25 mg BID, Lisinopril 20 mg daily -Started Lipitor 40 mg daily   3. Prolonged QTc - In the past patients QTc have been around 451.  Last night his EKG has QTc of 458.  This morning on telemetry his QTc has been 480.  Magnesium level was normal at 2.0.  Plan: -Repeat EKG this morning -Avoid QT prolonging drugs  4. Hypertension - BP this morning was 143/87.    Plan: -Monitor VS -Continue Furosemide 40 mg daily, Carvedilol 25 mg BID, Lisinopril 20 mg daily, Amlodipine 5 mg   5. Morbid obesity - Patient states that he has been exercising and has cut down on how much he eats with each meal since Feb. 2014.  He states he has lost 25 lbs in that time period.  Plan: -Continue to encourage weight loss and nutrition counseling   6. History of asthma in childhood - Stable.  No exacerbations since 13.  8. F/E/N  -will monitor and replace electrolytes prn  -cardiac diet   9. DVT px  -Lovenox   Dispo: Disposition is deferred at this time, awaiting improvement of current medical problems. Anticipated discharge in approximately 2-3 day(s).   The patient does have a current PCP ([facility], Brien Few, MD), therefore will be requiring OPC follow-up after discharge.   The patient does not have transportation limitations that hinder transportation to clinic appointments.    Length of Stay: 0 days   This is a Psychologist, occupational Note.  The care of the patient was discussed with Dr. Heloise Beecham and the assessment and plan formulated with their assistance.  Please see their attached note or addendum for official documentation of the daily encounter.  Resident Co-sign Daily Note: I have seen the patient and reviewed the daily progress note by Jacqualine Mau MS 3 and discussed the care of the patient with them.  See below for documentation of my findings, assessment,  and plans.  Subjective: No complaints this am.  Objective: Vital signs in last 24 hours: Filed Vitals:   11/24/12 0230 11/24/12 0301 11/24/12 0332 11/24/12 0626  BP: 120/71 120/72 126/78 143/87  Pulse: 78 81 71 69  Temp:  98.5 F (36.9 C) 97.7 F (36.5 C)   TempSrc:  Oral Oral   Resp: 19 18 16    Height:   6\' 3"  (1.905 m)   Weight:   425 lb (192.779 kg)   SpO2: 95% 96% 96%    Physical  Exam: Vitals reviewed. General: resting in bed, NAD Cardiac: RRR, no rubs, murmurs or gallops Pulm: decreased BS 2/2 habitus, no rales appreciated ZOX:WRUEA, nontender Ext: warm Neuro: alert and oriented X3, cranial nerves II-XII grossly intact  Lab Results: Reviewed and documented in Electronic Record Micro Results: Reviewed and documented in Electronic Record Studies/Results: Reviewed and documented in Electronic Record Medications: I have reviewed the patient's current medications. Scheduled Meds: . amLODipine  5 mg Oral QPM  . atorvastatin  40 mg Oral q1800  . carvedilol  25 mg Oral BID WC  . enoxaparin (LOVENOX) injection  100 mg Subcutaneous Q24H  . furosemide  40 mg Oral Daily  . hydrochlorothiazide  12.5 mg Oral Daily  . lisinopril  20 mg Oral Daily  . sodium chloride  3 mL Intravenous Q12H   Continuous Infusions:  PRN Meds:.sodium chloride, acetaminophen, acetaminophen, nitroGLYCERIN, ondansetron (ZOFRAN) IV, ondansetron (ZOFRAN) IV, sodium chloride Assessment/Plan:  1. Atypical chest pain -  Does not seem to be anginal. Troponin negative x3, no EKG changes. Was scheduled to have echo as outpatient prior to admission. Will perform here and call cards.   2. Chronic Systolic and diastolic CHF -  Repeat echo today. Continue appropriate meds   3. Prolonged QTc -  Slightly prolonged at 480. Will repeat EKG this am   4. Hypertension -  143/87 this am. Continue antiHTN  5. Morbid obesity  25 lb weight loss over past 9 months. Would benefit from bariatric surgery but needs  HF status optimized. Defer to outpatient.    F/E/N  HH diet   DVT px  Lovenox   LOS: 0 days   Bronson Curb 11/24/2012, 10:32 AM

## 2012-11-24 NOTE — Interval H&P Note (Signed)
History and Physical Interval Note:  11/24/2012 1:13 PM  Mark Baker  has presented today for surgery, with the diagnosis of cp  The various methods of treatment have been discussed with the patient and family. After consideration of risks, benefits and other options for treatment, the patient has consented to  Procedure(s): LEFT HEART CATHETERIZATION WITH CORONARY ANGIOGRAM (N/A) as a surgical intervention .  The patient's history has been reviewed, patient examined, no change in status, stable for surgery.  I have reviewed the patient's chart and labs.  Questions were answered to the patient's satisfaction.     Theron Arista [facility]ero[facility] 11/24/2012 1:13 PM

## 2012-11-25 DIAGNOSIS — I517 Cardiomegaly: Secondary | ICD-10-CM

## 2012-11-25 DIAGNOSIS — I10 Essential (primary) hypertension: Secondary | ICD-10-CM

## 2012-11-25 DIAGNOSIS — I509 Heart failure, unspecified: Secondary | ICD-10-CM

## 2012-11-25 DIAGNOSIS — R079 Chest pain, unspecified: Secondary | ICD-10-CM

## 2012-11-25 DIAGNOSIS — E785 Hyperlipidemia, unspecified: Secondary | ICD-10-CM

## 2012-11-25 DIAGNOSIS — I5042 Chronic combined systolic (congestive) and diastolic (congestive) heart failure: Secondary | ICD-10-CM

## 2012-11-25 LAB — BASIC METABOLIC PANEL
Calcium: 9.3 mg/dL (ref 8.4–10.5)
Chloride: 100 mEq/L (ref 96–112)
Creatinine, Ser: 1.19 mg/dL (ref 0.50–1.35)
GFR calc Af Amer: >90 mL/min (ref >90)
Sodium: 140 mEq/L (ref 135–145)

## 2012-11-25 LAB — CBC
HCT: 42.4 % (ref 39.0–52.0)
Platelets: 212 10*3/uL (ref 150–400)
RBC: 4.9 MIL/uL (ref 4.22–5.81)
RDW: 12.3 % (ref 11.5–15.5)
WBC: 6.8 10*3/uL (ref 4.0–10.5)

## 2012-11-25 MED ORDER — ATORVASTATIN CALCIUM 40 MG PO TABS: 40.0000 mg | ORAL_TABLET | Freq: Every day | ORAL | Status: DC

## 2012-11-25 MED ORDER — POTASSIUM CHLORIDE CRYS ER 20 MEQ PO TBCR
40.0000 meq | EXTENDED_RELEASE_TABLET | Freq: Once | ORAL | Status: AC
  Administered 2012-11-25: 40 meq via ORAL
  Filled 2012-11-25: qty 2

## 2012-11-25 NOTE — Progress Notes (Signed)
   TELEMETRY: Reviewed telemetry pt in NSR Filed Vitals:   11/25/12 0412 11/25/12 0623 11/25/12 0723 11/25/12 0949  BP: 136/87 138/89 148/90 129/82  Pulse: 75 62 72 72  Temp: 97.8 F (36.6 C)  97.8 F (36.6 C) 98.1 F (36.7 C)  TempSrc: Oral  Oral Oral  Resp: 20  20 20   Height:      Weight: 424 lb 9.6 oz (192.597 kg)     SpO2: 100%  94% 98%    Intake/Output Summary (Last 24 hours) at 11/25/12 1126 Last data filed at 11/25/12 0953  Gross per 24 hour  Intake 1629.76 ml  Output   1290 ml  Net 339.76 ml    SUBJECTIVE Feels well today. No chest pain or dyspnea.  LABS: Basic Metabolic Panel:  Recent Labs  16/10/96 1804 11/24/12 0126 11/25/12 0520  NA 140  --  140  K 3.5  --  3.4*  CL 99  --  100  CO2 29  --  31  GLUCOSE 93  --  93  BUN 13  --  13  CREATININE 1.07  --  1.19  CALCIUM 9.8  --  9.3  MG  --  2.0  --    Liver Function Tests: No results found for this basename: AST, ALT, ALKPHOS, BILITOT, PROT, ALBUMIN,  in the last 72 hours  Recent Labs  11/24/12 0126  LIPASE 107*   CBC:  Recent Labs  11/23/12 1804 11/25/12 0520  WBC 6.4 6.8  HGB 14.7 13.7  HCT 43.1 42.4  MCV 84.8 86.5  PLT 252 212   Cardiac Enzymes:  Recent Labs  11/24/12 0126 11/24/12 0828  TROPONINI <0.30 <0.30   Radiology/Studies:  Dg Chest 2 View  11/23/2012  *RADIOLOGY REPORT*  Clinical Data: Hemoptysis.  Chest pain and weakness.  CHEST - 2 VIEW  Comparison: 11/08/2012  Findings: The heart is enlarged.  The vascularity is normal.  Lungs are clear.  No effusions.  No bony abnormalities.  IMPRESSION: Cardiomegaly.  No active disease.   Original Report Authenticated By: Paulina Fusi, M.D.     PHYSICAL EXAM General: Well developed, morbidly obese, in no acute distress. Head: Normal Neck: Negative for carotid bruits. JVD not elevated. Lungs: Clear bilaterally to auscultation without wheezes, rales, or rhonchi. Breathing is unlabored. Heart: RRR S1 S2 without murmurs, rubs, or  gallops.  Extremities:  Distal pedal pulses are 2+ and equal bilaterally. No radial site hematoma. Neuro: Alert and oriented X 3. Moves all extremities spontaneously.   ASSESSMENT AND PLAN: 1. Chronic systolic CHF. Echo pending. EF approx. 40% by cath. Stable on lisinopril, lasix, carvediolol, and HCTZ. Filling pressures normal by cath.   2. Chest pain. Normal coronary anatomy. OK for discharge today from cardiac standpoint. Follow up with Dr. Elease Hashimoto in office.  3. HTN controlled.  4. Morbid obesity.  Principal Problem:   Chest pain Active Problems:   Morbid obesity   Systolic and diastolic CHF, chronic   HTN (hypertension)   Prolonged QT interval   Other and unspecified hyperlipidemia    Signed, Peter [place] MD,FACC 11/25/2012 11:30 AM

## 2012-11-25 NOTE — Progress Notes (Signed)
*  PRELIMINARY RESULTS* Echocardiogram 2D Echocardiogram has been performed.  [name] 11/25/2012, 9:37 AM

## 2012-11-25 NOTE — Progress Notes (Addendum)
Pt d/c home with wife. D/c instructions and medications reviewed with Pt. tp states understanding. All Pt questions answered .

## 2012-11-25 NOTE — Discharge Summary (Signed)
Internal Medicine Teaching [facility] Discharge Note  Name: Mark Baker MRN: [id number] DOB: 03-21-1981 32 y.o.  Date of Admission: 11/24/2012 12:16 AM Date of Discharge: 11/25/2012 Attending Physician: Jonah Blue, DO  Discharge Diagnosis: Principal Problem:   Chest pain Active Problems:   Morbid obesity   Systolic and diastolic CHF, chronic   HTN (hypertension)   Prolonged QT interval   Other and unspecified hyperlipidemia   Discharge Medications:   Medication List    TAKE these medications       amLODipine 5 MG tablet  Commonly known as:  NORVASC  Take 5 mg by mouth every evening.     atorvastatin 40 MG tablet  Commonly known as:  LIPITOR  Take 1 tablet (40 mg total) by mouth daily at 6 PM.     carvedilol 12.5 MG tablet  Commonly known as:  COREG  Take 25 mg by mouth 2 (two) times daily with a meal.     furosemide 40 MG tablet  Commonly known as:  LASIX  Take 40 mg by mouth daily.     hydrochlorothiazide 12.5 MG capsule  Commonly known as:  MICROZIDE  Take 12.5 mg by mouth daily.     lisinopril 20 MG tablet  Commonly known as:  PRINIVIL,ZESTRIL  Take 20 mg by mouth daily.     sildenafil 50 MG tablet  Commonly known as:  VIAGRA  Take 50 mg by mouth daily as needed for erectile dysfunction.        Disposition and follow-up:   Mark Baker was discharged from [facility] in Good condition.  At the hospital follow up visit please address  1) CHF Patient has hypertensive cardiomyopathy with ejection fraction of 45%. Catheterization during hospital revealed no obstructing coronary artery disease. Please assess volume status and compliant with antihypertensive medications. 2) Morbid obesity Please refer patient to weight loss program that was a long hospital. ? consideration of bariatric surgery although her function may not permit. 3) Hyperlipidemia  High dose statin started during this admission. Will need followup liver  function.   Follow-up Appointments:     Follow-up Information   Follow up with Elfredia Nevins, MD On 12/09/2012. (2:45pm)    Contact information:   [address] [place] [address] [phone]      Discharge Orders   Future Appointments Provider Department Dept Phone   11/30/2012 8:30 PM [facility] [phone]   12/09/2012 2:45 PM Elfredia Nevins, MD [facility] [phone]   Future Orders Complete By Expires     (HEART FAILURE PATIENTS) Call MD:  Anytime you have any of the following symptoms: 1) 3 pound weight gain in 24 hours or 5 pounds in 1 week 2) shortness of breath, with or without a dry hacking cough 3) swelling in the hands, feet or stomach 4) if you have to sleep on extra pillows at night in order to breathe.  As directed     Call MD for:  difficulty breathing, headache or visual disturbances  As directed     Call MD for:  extreme fatigue  As directed     Call MD for:  persistant dizziness or light-headedness  As directed     Call MD for:  redness, tenderness, or signs of infection (pain, swelling, redness, odor or green/yellow discharge around incision site)  As directed     Diet - low sodium heart healthy  As directed     Increase activity slowly  As directed        Consultations: Treatment Team:  Rounding Lbcardiology, MD  Procedures Performed:  Dg Chest 2 View  11/23/2012  *RADIOLOGY REPORT*  Clinical Data: Hemoptysis.  Chest pain and weakness.  CHEST - 2 VIEW  Comparison: 11/08/2012  Findings: The heart is enlarged.  The vascularity is normal.  Lungs are clear.  No effusions.  No bony abnormalities.  IMPRESSION: Cardiomegaly.  No active disease.   Original Report Authenticated By: Paulina Fusi, M.D.    Dg Chest Portable 1 View  11/08/2012  *RADIOLOGY REPORT*  Clinical Data: Stabbing chest pain today.  Shortness of breath with nausea.  PORTABLE CHEST - 1 VIEW   Comparison: 10/21/2012 and 02/10/2012.  Findings: 1654 hours.  There are lower lung volumes.  Cardiomegaly and vascular congestion are again noted.  There is no overt pulmonary edema or confluent airspace opacity.  There is no pneumothorax or significant pleural effusion.  Telemetry leads overlie the chest.  IMPRESSION: Cardiomegaly and vascular congestion, similar to the prior examination.  No new findings.   Original Report Authenticated By: Carey Bullocks, M.D.     2D Echo:  11/24/12 Study Conclusions - Left ventricle: The cavity size was moderately dilated. Wall thickness was increased in a pattern of moderate LVH. Systolic function was mildly to moderately reduced. The estimated ejection fraction was in the range of 40% to 45%. - Left atrium: The atrium was moderately dilated  Cardiac Cath:  11/23/12 Final Conclusions:  1. Normal coronary anatomy.  2. Moderate LV dysfunction.  3. Normal LV filling pressures.  Admission HPI:  32 y.o morbidly obese male with PMH HTN, chronic systolic and diastolic heart failure, possible sleep apnea, controlled asthma presents with chest pain and shortness of breath. Chest pain started at 5:30 PM today and at the time of Internal Medicine teaching service evaluation chest pain had resolved. Patient initially came to the ED then left AMA around 9 PM and refused further treatment then later returned for chest pain complaints. Chest pain was left sided 8/10, radiated to left shoulder and posterior neck which is new, like a "knife turning". EMS gave him 1 NTG which helped but lead to a headache. Nothing made his chest pain worse. He had shortness of breath earlier today which is resolved. Associated review of systems: +sweating during chest pain, weight stable in the 420s lbs, +dizziness.    Hospital Course by problem list:  1. Atypical chest pain: [name] is a 32 yo male who presented to the ED with chest pain, shortness of breath and dizziness.  He was given a  Nitroglycerin which improved his symptoms but made him lightheaded.  EKG and troponins were done which all returned within normal limits and ruled out ACS.  The EKG showed LVH with sinus rhythm.  Given that he had atypical chest pain (substernal tightness which improved with NG) cardiology was consulted.  Cardiology recommended Transthoracic echocardiogram and cardiac catherization.  The cardiac catherization showed no signs of CAD in the LAD, LCx or RCA.  EF was determined to be around 40% which is an improvement from July 2013 when it was 25-30%. Echo was performed 11/24/12 which showed global hypokinesis of LV with EF of 45%. There was moderately dilated L atrium. Visualization of the right ventricle was too poor to draw any conclusions about function. Continue with medical management of hypertension, risk factor modification. Patient will need referral to weight loss program as  outpatient.  2. Chronic systolic and diastolic heart failure:  This was stable during this hospitalization.  2D Echocardiogram results are still pending.  Left heart catherization showed EF 35-45%, left ventricular systolic function was abnormal with global hypokinesis. EF has improved since his initial diagnosis in July 2013.  Plan is to continue furosemide 40 mg daily, Carvedilol 25mg  BID, Lisinopril 20mg  daily and start taking Lipitor 40mg  daily.  3. Prolonged QTc On admission, patient had long QTc on EKG but never had an arrhythmia while here.  QT prolonging drugs were not given.  On day of discharge, QTc was normal to 455.  4. Hypertension Stable throughout this hospitalization.  BP on day of discharge was 148/90. Plan is to continue Furosemide 40 mg daily, Carvedilol 25 mg BID, Lisinopril 20 mg daily and Amlodipine 5 mg.   5. Morbid obesity  Patient states that he has been exercising and has cut down on how much he eats with each meal since Feb. 2014. He states he has lost 25 lbs in the past 9 months.  He will need an  outpatient referral for weight loss program.  6. Hyperlipidemia LDL 109 and HDL 30. Started atorvastatin during this admission. Will need a metabolic panel to monitor liver function as outpatient.  Discharge Vitals:  BP 136/75  Pulse 64  Temp(Src) 98.2 F (36.8 C) (Oral)  Resp 18  Ht 6\' 3"  (1.905 m)  Wt 424 lb 9.6 oz (192.597 kg)  BMI 53.07 kg/m2  SpO2 98%  Discharge Labs:  Results for orders placed during the hospital encounter of 11/24/12 (from the past 24 hour(s))  PROTIME-INR     Status: None   Collection Time    11/24/12 12:40 PM      Result Value Range   Prothrombin Time 13.3  11.6 - 15.2 seconds   INR 1.02  0.00 - 1.49  BASIC METABOLIC PANEL     Status: Abnormal   Collection Time    11/25/12  5:20 AM      Result Value Range   Sodium 140  135 - 145 mEq/L   Potassium 3.4 (*) 3.5 - 5.1 mEq/L   Chloride 100  96 - 112 mEq/L   CO2 31  19 - 32 mEq/L   Glucose, Bld 93  70 - 99 mg/dL   BUN 13  6 - 23 mg/dL   Creatinine, Ser 1.61  0.50 - 1.35 mg/dL   Calcium 9.3  8.4 - 09.6 mg/dL   GFR calc non Af Amer 80 (*) >90 mL/min   GFR calc Af Amer >90  >90 mL/min  CBC     Status: None   Collection Time    11/25/12  5:20 AM      Result Value Range   WBC 6.8  4.0 - 10.5 K/uL   RBC 4.90  4.22 - 5.81 MIL/uL   Hemoglobin 13.7  13.0 - 17.0 g/dL   HCT 04.5  40.9 - 81.1 %   MCV 86.5  78.0 - 100.0 fL   MCH 28.0  26.0 - 34.0 pg   MCHC 32.3  30.0 - 36.0 g/dL   RDW 91.4  78.2 - 95.6 %   Platelets 212  150 - 400 K/uL    Signed: Bronson Curb 11/25/2012, 11:39 AM   Time Spent on Discharge: 35 min Services Ordered on Discharge: none  Equipment Ordered on Discharge: none

## 2012-11-25 NOTE — Progress Notes (Signed)
Medical Student Daily Progress Note   Subjective:    Interval Events:  No acute events overnight.  [name] does not complain of chest pain, shortness of breath, nausea, vomiting or dizziness this morning.  He tolerated his breakfast well.      Objective:    Vital Signs:   Temp:  [97.8 F (36.6 C)]        97.8 F (36.6 C) (04/16 0723) Pulse Rate:  [62-85]         72 (04/16 0723) Resp:  [16-20]         20 (04/16 0723) BP: (118-152)/(65-96)        148/90 mmHg (04/16 0723) SpO2:  [87 %-100 %]         94 % (04/16 0723) FiO2 (%):  [97 %]         97 % (04/15 2053) Weight:  [192.5 kg (424 lb 6.2 oz) Last BM Date: 11/24/12    Physical Exam: GENERAL: obese, well developed, well nourished; no acute distress  HEAD: atraumatic, normocephalic  EYES: sclera anicteric; normal conjunctiva  NOSE: no erythema or drainage  MOUTH/THROAT: oropharynx clear, moist mucous membranes  NECK: supple  LYMPH: no cervical or supraclavicular lymphadenopathy  LUNGS: clear to auscultation bilaterally, normal work of breathing  HEART: normal rate and regular rhythm; normal S1 and S2 without S3 or S4; no murmurs, rubs, or clicks  SKIN: warm, dry, intact, normal turgor, no rashes  EXTREMITIES: no peripheral edema, clubbing, or cyanosis    Labs: Basic Metabolic Panel:  Recent Labs Lab 11/23/12 1804 11/24/12 0126 11/25/12 0520  NA 140  --  140  K 3.5  --  3.4*  CL 99  --  100  CO2 29  --  31  GLUCOSE 93  --  93  BUN 13  --  13  CREATININE 1.07  --  1.19  CALCIUM 9.8  --  9.3  MG  --  2.0  --    CBC:  Recent Labs Lab 11/23/12 1804 11/25/12 0520  WBC 6.4 6.8  HGB 14.7 13.7  HCT 43.1 42.4  MCV 84.8 86.5  PLT 252 212    CBG:  Recent Labs Lab 11/24/12 1050  GLUCAP 110*    Coagulation Studies:  Recent Labs  11/24/12 1240  LABPROT 13.3  INR 1.02    Other results: EKG Results:  11/24/2012 Rate:  89 PR:  150 QRS:  98 QTc:  455 EKG: LVH with possible left atrial  enlargement.  Normal sinus rhythm.    Medications:    Scheduled Medications: . amLODipine  5 mg Oral QPM  . atorvastatin  40 mg Oral q1800  . carvedilol  25 mg Oral BID WC  . furosemide  40 mg Oral Daily  . hydrochlorothiazide  12.5 mg Oral Daily  . lisinopril  20 mg Oral Daily  . sodium chloride  3 mL Intravenous Q12H     PRN Medications: sodium chloride, acetaminophen, acetaminophen, nitroGLYCERIN, ondansetron (ZOFRAN) IV, ondansetron (ZOFRAN) IV, sodium chloride    Assessment/ Plan:    [name] is a 32 y.o obese male with a history of chronic systolic and diastolic heart failure, hypertension,  asthma presented to the ED with chest pain, shortness of breath and dizziness.   1. Atypical chest pain - Differential includes ACS, PE, Costochondral, CHF exacerbation and esophageal spasm. ACS has been ruled out given troponins negative x 3 and no findings on EKG. Less likely PE given that he does not have any  risk factors and geneva score = 3 and Wells = 0. Could be costochondral given patients obesity however given that pain and come and gone intermittently with no obvious cause and that it is not reproducible on exam this is not likely. Not likely CHF exacerbation due to Cath yesterday showing no CAD. Could less likely be esophageal spasm given that pain is intermittent but this would not explain is shortness of breath or dizziness episodes.   Plan: -Will recommend bariatric surgery weight loss program   2. Chronic systolic and diastolic heart failure - Patient got a heart catherization yesterday to evaluate heart function which showed EF 35-45%, left ventricular systolic function was abnormal with global hypokinesis.  Plan:  -Monitor VS  -Continue Furosemide 40 mg daily, Carvedilol 25 mg BID, Lisinopril 20 mg daily  -Started Lipitor 40 mg daily   3. Prolonged QTc - Stable.  In the past patients QTc have been around 451. Last night his EKG has QTc of 458. This morning on telemetry  his QTc has been 480. Magnesium level was normal at 2.0.   Plan:  -Repeat EKG yesterday showed QTc 455. -Avoid QT prolonging drugs   4. Hypertension - BP this morning was 148/90.   Plan:  -Monitor VS  -Continue Furosemide 40 mg daily, Carvedilol 25 mg BID, Lisinopril 20 mg daily, Amlodipine 5 mg   5. Morbid obesity - Patient states that he has been exercising and has cut down on how much he eats with each meal since Feb. 2014. He states he has lost 25 lbs in that time period.   Plan:  -Will recommend bariatric surgery weight loss program  6. History of asthma in childhood - Stable. No exacerbations since 13.   8. F/E/N  -will monitor and replace electrolytes prn  -cardiac diet   9. DVT px  -Lovenox   Dispo: Disposition is deferred at this time, awaiting improvement of current medical problems. Anticipated discharge in approximately 2-3 day(s).   The patient does have a current PCP ([facility], Brien Few, MD), therefore will be requiring OPC follow-up after discharge.   The patient does not have transportation limitations that hinder transportation to clinic appointments.       Length of Stay: 1 days   This is a Psychologist, occupational Note.  The care of the patient was discussed with Dr. Heloise Beecham and the assessment and plan formulated with their assistance.  Please see their attached note or addendum for official documentation of the daily encounter.    Internal Medicine Teaching Service Resident Admission Note Date: 11/25/2012  Patient name: Mark Baker Medical record number: [id number] Date of birth: Nov 03, 1980 Age: 33 y.o. Gender: male PCP: Lorretta Harp, MD  Medical Service:  I have reviewed the note by Jacqualine Mau MS3 and was present during the interview and physical exam.  Please see below for findings, assessment, and plan.  Subjective: No complaints overnight. LHC yesterday with no CAD. No pain/hematoma at R wrist access site.   Meds: Medications Prior to Admission   Medication Sig Dispense Refill  . amLODipine (NORVASC) 5 MG tablet Take 5 mg by mouth every evening.      . carvedilol (COREG) 12.5 MG tablet Take 25 mg by mouth 2 (two) times daily with a meal.      . furosemide (LASIX) 40 MG tablet Take 40 mg by mouth daily.      . hydrochlorothiazide (MICROZIDE) 12.5 MG capsule Take 12.5 mg by mouth daily.      Marland Kitchen lisinopril (PRINIVIL,ZESTRIL)  20 MG tablet Take 20 mg by mouth daily.      . sildenafil (VIAGRA) 50 MG tablet Take 50 mg by mouth daily as needed for erectile dysfunction.        Allergies: Allergies as of 11/24/2012 - Review Complete 11/24/2012  Allergen Reaction Noted  . Nitroglycerin Other (See Comments) 11/19/2012  . Aspirin Hives, Itching, and Rash 10/21/2012    Past Medical History: Medical Student note reviewed  Family History: Medical Student note reviewed  Social History: Psychologist, occupational note reviewed  Surgical History: Medical Student note reviewed  Review of System: Medical Student note reviewed  Physical Exam: Blood pressure 148/90, pulse 72, temperature 97.8 F (36.6 C), temperature source Oral, resp. rate 20, height 6\' 3"  (1.905 m), weight 424 lb 9.6 oz (192.597 kg), SpO2 94.00%. Vitals reviewed.  General: resting in bed, NAD  Cardiac: RRR, no rubs, murmurs or gallops  Pulm: decreased BS 2/2 habitus, no rales appreciated  JYN:WGNFA, nontender  Ext: R wrist cath access site without hematoma. Good perfusion of R hand.  Neuro: alert and oriented X3, cranial nerves II-XII grossly intact   Labs: Reviewed as noted in the Electronic Record  Imaging: Reviewed as noted in the Electronic Record  Assessment & Plan by Problem: 1. Atypical chest pain  Cath w clean coronaries. Will need medical mgmt, certainly weight loss.   2. Chronic systolic and diastolic heart failure  ? Echo to look at RV func. Continue lasix  3. Prolonged QTc -  Stable.     4. Hypertension - BP this morning was 148/90 Carvedilol 25 mg  BID, Lisinopril 20 mg daily, Amlodipine 5 mg   5. Morbid obesity  Referral to [facility] weight loss program as outpatient  6. DVT px  -Lovenox   Signed: Bronson Curb 11/25/2012, 8:57 AM

## 2012-11-25 NOTE — Discharge Summary (Signed)
INTERNAL MEDICINE TEACHING SERVICE Attending Note  Date: 11/25/2012  Patient name: Mark Baker  Medical record number: [id number]  Date of birth: 02/26/1981    This patient has been seen and discussed with the house staff. Please see their note for complete details. I concur with their findings and plan.  The treatment plan was discussed in detail with the patient.  Alternatives to treatment, side effects, risks and benefits, and complications were discussed with the patient. Informed consent was obtained. The patient agrees to proceed with the current treatment plan.  Jonah Blue, DO  11/25/2012, 2:18 PM

## 2012-11-26 NOTE — Progress Notes (Signed)
UR Completed. Jenafer Winterton, RN, BSN Nurse Case Manager  [phone]  

## 2012-11-27 NOTE — ED Provider Notes (Signed)
Medical screening examination/treatment/procedure(s) were performed by non-physician practitioner and as supervising physician I was immediately available for consultation/collaboration.  Jasmine Awe, MD 11/27/12 [phone]

## 2012-11-30 ENCOUNTER — Ambulatory Visit (HOSPITAL_BASED_OUTPATIENT_CLINIC_OR_DEPARTMENT_OTHER): Payer: 59 | Attending: Internal Medicine

## 2012-11-30 VITALS — Ht 75.0 in | Wt >= 6400 oz

## 2012-11-30 DIAGNOSIS — G4733 Obstructive sleep apnea (adult) (pediatric): Secondary | ICD-10-CM | POA: Insufficient documentation

## 2012-12-05 DIAGNOSIS — G4737 Central sleep apnea in conditions classified elsewhere: Secondary | ICD-10-CM

## 2012-12-05 DIAGNOSIS — R0989 Other specified symptoms and signs involving the circulatory and respiratory systems: Secondary | ICD-10-CM

## 2012-12-05 DIAGNOSIS — R0609 Other forms of dyspnea: Secondary | ICD-10-CM

## 2012-12-05 DIAGNOSIS — G4733 Obstructive sleep apnea (adult) (pediatric): Secondary | ICD-10-CM

## 2012-12-06 NOTE — Procedures (Signed)
NAME:  Mark Baker, Mark Baker                 ACCOUNT NO.:  [id number]  MEDICAL RECORD NO.:  [id number]          PATIENT TYPE:  OUT  LOCATION:  SLEEP CENTER                 FACILITY[facility]  [facility]  PHYSICIAN:  Clinton D. Maple Hudson, MD, FCCP, FACPDATE OF BIRTH:  Nov 28, 1979  DATE OF STUDY:  11/30/2012                           NOCTURNAL POLYSOMNOGRAM  REFERRING PHYSICIAN:  STEWART ROGERS  INDICATION FOR STUDY:  Hypersomnia with sleep apnea.  EPWORTH SLEEPINESS SCORE:  5/24.  BMI 53.1, weight 425 pounds, height 75 inches, neck 19 inches.  MEDICATIONS:  Home medications are charted and reviewed.  SLEEP ARCHITECTURE:  Split study protocol.  During the diagnostic phase, total sleep time 125 minutes with sleep efficiency 65.4%.  Stage I was 4.4%, stage II 84.8%.  Stage III absent, REM 10.8% of total sleep time. Sleep latency 63.5 minutes, REM latency 69 minutes.  Awake after sleep onset 3 minutes.  Arousal index 5.3.  BEDTIME MEDICATION:  None.  RESPIRATORY DATA:  Split-study protocol.  Apnea/hypopnea index (AHI) 15.8 per hour.  A total of 33 events was scored including 21 central apneas and 12 hypopneas.  Events were not positional.  REM AHI 80 per hour.  CPAP was titrated to 13 CWP, AHI 0 per hour.  He wore a large ResMed Quattro full-face mask with heated humidifier.  OXYGEN DATA:  Snoring was moderately loud before CPAP with oxygen desaturation to a nadir of 72% on room air.  With CPAP control, snoring was prevented and mean oxygen saturation held 94% on room air.  CARDIAC DATA:  Sinus rhythm with occasional PVC.  MOVEMENT-PARASOMNIA:  No significant movement disturbance.  No bathroom trips.  IMPRESSIONS-RECOMMENDATIONS: 1. 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     Moderately loud snoring with oxygen desaturation to a nadir of 72%     on room air. 2. Successful continuous positive airway pressure  titration to 13     centimeters of water pressure, apnea-hypopnea index 0 per hour.  He     wore a large ResMed Quattro full-face mask with heated humidifier.     Snoring was prevented and mean oxygen saturation held 94% on     continuous positive airway pressure with room air. 3. Because of his size, events recorded as "central" may actually have     been mixed apneas with monitor     sensitivity not quite picking up the respiratory effort.  Suggest     home trial of continuous positive airway pressure at a pressure of     13 for therapeutic trial.     Clinton D. Maple Hudson, MD, [facility], FACP Diplomate, American Board of Sleep Medicine    CDY/MEDQ  D:  12/05/2012 08:35:57  T:  12/06/2012 05:27:05  Job:  [id number]

## 2012-12-08 ENCOUNTER — Telehealth: Payer: Self-pay | Admitting: Internal Medicine

## 2012-12-08 NOTE — Telephone Encounter (Signed)
The patient calls in wanting to talk about how his boss is causing him stress and wanting a doctor's note so he can be transferred to another division at work. He states that he can trace his chest pains origin back to stress with his boss. He is not acutely having chest pains. I advised him to keep his appointment for tomorrow at the clinic and to discuss it further with the doctor there and he was in agreement with this plan.  Genella Mech 5:16 PM 12/08/2012

## 2012-12-09 ENCOUNTER — Ambulatory Visit (INDEPENDENT_AMBULATORY_CARE_PROVIDER_SITE_OTHER): Payer: 59 | Admitting: Radiation Oncology

## 2012-12-09 ENCOUNTER — Encounter: Payer: Self-pay | Admitting: Radiation Oncology

## 2012-12-09 VITALS — BP 148/74 | HR 77 | Temp 98.4°F | Ht 74.5 in | Wt >= 6400 oz

## 2012-12-09 DIAGNOSIS — G4730 Sleep apnea, unspecified: Secondary | ICD-10-CM

## 2012-12-09 DIAGNOSIS — R079 Chest pain, unspecified: Secondary | ICD-10-CM

## 2012-12-09 DIAGNOSIS — I10 Essential (primary) hypertension: Secondary | ICD-10-CM

## 2012-12-09 DIAGNOSIS — I5042 Chronic combined systolic (congestive) and diastolic (congestive) heart failure: Secondary | ICD-10-CM

## 2012-12-09 DIAGNOSIS — I509 Heart failure, unspecified: Secondary | ICD-10-CM

## 2012-12-09 MED ORDER — LISINOPRIL 40 MG PO TABS: 40.0000 mg | ORAL_TABLET | Freq: Every day | ORAL | Status: DC

## 2012-12-09 NOTE — Patient Instructions (Signed)
General instructions:  Begin taking lisinopril 40mg  daily (increased from your previous 20mg  daily).   Continue taking all of your other medications as prescribed.

## 2012-12-10 NOTE — Assessment & Plan Note (Addendum)
Sleep study reveals pt has OSA for which he will need home CPAP. Will discuss with PCP.

## 2012-12-10 NOTE — Assessment & Plan Note (Signed)
BP Readings from Last 3 Encounters:  12/09/12 148/74  11/25/12 136/75  11/25/12 136/75    Lab Results  Component Value Date   NA 140 11/25/2012   K 3.4* 11/25/2012   CREATININE 1.19 11/25/2012    Assessment: Blood pressure control: mildly elevated Progress toward BP goal:  deteriorated Comments:   Plan: Medications:  Increase lisinopril 20mg  => 40mg . Continue all other medications with no changes in dose/frequency. Educational resources provided: brochure Self management tools provided: home blood pressure logbook Other plans:

## 2012-12-10 NOTE — Assessment & Plan Note (Addendum)
Pt's chest pain is atypical and appears secondary to anxiety from work stressors. He underwent cardiac cath within the last 2 weeks which was unrevealing of any occlusive coronary disease. Pt was informed that although he should find a way to remove himself from his current work environment due to the anxiety it causes him, he could not be provided with a letter stating he had a medical necessity for transfer. Per his request, he was provided a letter that documents the anxiety he is experiencing in relation to his current work environment.

## 2012-12-10 NOTE — Progress Notes (Signed)
I saw and evaluated the patient.  I personally confirmed the key portions of Dr. McTyre's history and exam and reviewed pertinent patient test results.  The assessment, diagnosis, and plan were formulated together and I agree with the documentation in the resident's note. 

## 2012-12-10 NOTE — Progress Notes (Signed)
Subjective:    Patient ID: Mark Baker, male    DOB: [birth year], 32 y.o.   MRN: [id number]  HPI Pt presents to clinic today for f/u on a recent hospitalization for chest pain. He states he has noticed a 7lb weight gain since discharge, but denies any increased leg swelling, orthopnea/PND, or SOB. He states he had one subsequent episode of chest pain a few days following discharge after an unfriendly encounter with his boss and he has come to the realization that his previous CP episode prior to his admission was associated with a similar encounter with his boss. He also sometimes has lightheadedness with these interactions, and his wife reports that he is very anxious and stressed at home for the next 1-2 days each time.   He reports he has had negative interactions since he received a new supervisor several months ago. He denies any previous stressful situations at this job which he has held for ~3 years. He would like a transfer to a different department but states he is currently being denied this option.   Mr. Ganser has no other complaints today. He denies any chest pain during his visit today.    Review of Systems  All other systems reviewed and are negative.   Current Outpatient Medications: Current Outpatient Prescriptions  Medication Sig Dispense Refill  . amLODipine (NORVASC) 5 MG tablet Take 5 mg by mouth every evening.      Marland Kitchen atorvastatin (LIPITOR) 40 MG tablet Take 1 tablet (40 mg total) by mouth daily at 6 PM.  30 tablet  5  . carvedilol (COREG) 12.5 MG tablet Take 25 mg by mouth 2 (two) times daily with a meal.      . furosemide (LASIX) 40 MG tablet Take 40 mg by mouth daily.      . hydrochlorothiazide (MICROZIDE) 12.5 MG capsule Take 12.5 mg by mouth daily.      Marland Kitchen lisinopril (PRINIVIL,ZESTRIL) 40 MG tablet Take 1 tablet (40 mg total) by mouth daily.  30 tablet  5  . sildenafil (VIAGRA) 50 MG tablet Take 50 mg by mouth daily as needed for erectile dysfunction.       No  current facility-administered medications for this visit.    Allergies: Allergies  Allergen Reactions  . Nitroglycerin Other (See Comments)    Patient takes Viagra, so can't take nitro.   . Aspirin Hives, Itching and Rash     Past Medical History: Past Medical History  Diagnosis Date  . Hypertension   . Asthma     childhood asthma no exacerbation since age 46   . Headache   . CHF (congestive heart failure)     systolic and diastolic     Past Surgical History: Past Surgical History  Procedure Laterality Date  . Anterior cruciate ligament repair      right    Family History: Family History  Problem Relation Age of Onset  . Hypertension Father   . Heart failure Father     paternal side of family   . Hypertension Sister   . Stroke Maternal Grandmother   . Hypertension      maternal side of family     Social History: History   Social History  . Marital Status: Married    Spouse Name: N/A    Number of Children: N/A  . Years of Education: N/A   Occupational History  . Not on file.   Social History Main Topics  . Smoking status: Never Smoker   .  Smokeless tobacco: Never Used  . Alcohol Use: Yes     Comment: seldom.  . Drug Use: No  . Sexually Active: Not on file   Other Topics Concern  . Not on file   Social History Narrative   Lives in [place] with wife of 4 years.  Works for [employer] at [employer]  1 son and 2 daughters.     Denies cigarettes. Drank 1 beer last night 11/22/12. Denies drugs            Vital Signs: Blood pressure 148/74, pulse 77, temperature 98.4 F (36.9 C), temperature source Oral, height 6' 2.5" (1.892 m), weight 432 lb 14.4 oz (196.362 kg), SpO2 95.00%.      Objective:   Physical Exam  Constitutional: He is oriented to person, place, and time. He appears well-developed and well-nourished. No distress.  Severe obesity.   HENT:  Head: Normocephalic and atraumatic.  Eyes: Conjunctivae are normal. Pupils are equal, round, and  reactive to light. No scleral icterus.  Neck: Normal range of motion. Neck supple. No tracheal deviation present.  Cardiovascular: Normal rate and regular rhythm.   No murmur heard. Pulmonary/Chest: Effort normal. He has no wheezes. He has no rales.  Abdominal: Soft. Bowel sounds are normal. He exhibits no distension. There is no tenderness.  Musculoskeletal: Normal range of motion. He exhibits no edema.  Neurological: He is alert and oriented to person, place, and time. No cranial nerve deficit.  Skin: Skin is warm and dry. No erythema.  Psychiatric: He has a normal mood and affect. His behavior is normal.          Assessment & Plan:

## 2012-12-10 NOTE — Assessment & Plan Note (Signed)
Pt is up ~8lbs since discharge 2 weeks ago, however he has no other signs/symptoms of worsened CHF and admits to eating very large quantities of food for the last two weeks which may account for some of this change.  - cont coreg 12.5mg  bid - cont lasix 40mg  daily - inc lisinopril 20mg  => 40mg  daily (see "HTN") - cont norvasc 5mg  daily

## 2012-12-11 ENCOUNTER — Telehealth: Payer: Self-pay | Admitting: *Deleted

## 2012-12-11 NOTE — Telephone Encounter (Signed)
Message copied by Antony Odea on Fri Dec 11, 2012  4:47 PM ------      Message from: Omar Person B      Created: Mon Nov 30, 2012  9:09 AM      Regarding: no show       11-24-12 echo    No show  ------

## 2012-12-11 NOTE — Telephone Encounter (Signed)
msg left to call and reschedule echo app, number provided.

## 2013-01-08 ENCOUNTER — Telehealth: Payer: Self-pay | Admitting: *Deleted

## 2013-01-08 NOTE — Telephone Encounter (Signed)
See previous note

## 2013-01-08 NOTE — Telephone Encounter (Signed)
Pt called for script for cpap, please refer to sleep study results, he does not know where to get it... [facility] may be his best choice and if he does not have insurance, it will be out of pocket.

## 2013-01-14 ENCOUNTER — Other Ambulatory Visit: Payer: Self-pay | Admitting: *Deleted

## 2013-01-14 DIAGNOSIS — I10 Essential (primary) hypertension: Secondary | ICD-10-CM

## 2013-01-15 MED ORDER — HYDROCHLOROTHIAZIDE 12.5 MG PO CAPS: 12.5000 mg | ORAL_CAPSULE | Freq: Every day | ORAL | Status: DC

## 2013-01-17 NOTE — Telephone Encounter (Signed)
Prescription of CPAP is put in medication room.  Lorretta Harp, MD PGY2, Internal Medicine Teaching Service Pager: [phone]

## 2013-02-04 ENCOUNTER — Other Ambulatory Visit: Payer: Self-pay | Admitting: *Deleted

## 2013-02-04 DIAGNOSIS — I5022 Chronic systolic (congestive) heart failure: Secondary | ICD-10-CM

## 2013-02-05 MED ORDER — CARVEDILOL 12.5 MG PO TABS: 25.0000 mg | ORAL_TABLET | Freq: Two times a day (BID) | ORAL | Status: DC

## 2013-04-21 ENCOUNTER — Encounter: Payer: Self-pay | Admitting: Internal Medicine

## 2013-04-21 NOTE — Progress Notes (Signed)
Patient ID: Mark Baker, male   DOB: Oct 24, 1980, 32 y.o.   MRN: [id number]  04/21/13 AM  Patient requests me to fill out a work accommodation form. Since I have not seen patient since 10/27/12, I will make an appointment for him and re-evaluate patient before filling out the form.  Lorretta Harp, MD PGY3, Internal Medicine Teaching Service Pager: [phone]

## 2013-09-16 ENCOUNTER — Encounter: Payer: Self-pay | Admitting: Internal Medicine

## 2013-11-29 ENCOUNTER — Other Ambulatory Visit: Payer: Self-pay | Admitting: Internal Medicine

## 2013-11-29 ENCOUNTER — Other Ambulatory Visit: Payer: Self-pay | Admitting: Cardiovascular Disease

## 2013-11-29 DIAGNOSIS — I509 Heart failure, unspecified: Secondary | ICD-10-CM

## 2014-01-21 ENCOUNTER — Other Ambulatory Visit: Payer: Self-pay | Admitting: *Deleted

## 2014-01-21 DIAGNOSIS — I10 Essential (primary) hypertension: Secondary | ICD-10-CM

## 2014-01-21 MED ORDER — LISINOPRIL 40 MG PO TABS: 40.0000 mg | ORAL_TABLET | Freq: Every day | ORAL | Status: DC

## 2014-01-24 ENCOUNTER — Other Ambulatory Visit: Payer: Self-pay | Admitting: Internal Medicine

## 2014-01-24 DIAGNOSIS — I10 Essential (primary) hypertension: Secondary | ICD-10-CM

## 2014-05-21 ENCOUNTER — Emergency Department (HOSPITAL_COMMUNITY): Payer: BC Managed Care – PPO

## 2014-05-21 ENCOUNTER — Encounter (HOSPITAL_COMMUNITY): Payer: Self-pay | Admitting: Emergency Medicine

## 2014-05-21 ENCOUNTER — Inpatient Hospital Stay (HOSPITAL_COMMUNITY)
Admission: EM | Admit: 2014-05-21 | Discharge: 2014-05-27 | DRG: 292 | Disposition: A | Discharge status: Home / Self Care | Payer: BC Managed Care – PPO | Attending: Internal Medicine | Admitting: Internal Medicine

## 2014-05-21 ENCOUNTER — Other Ambulatory Visit: Payer: Self-pay

## 2014-05-21 DIAGNOSIS — J45909 Unspecified asthma, uncomplicated: Secondary | ICD-10-CM | POA: Diagnosis present

## 2014-05-21 DIAGNOSIS — R601 Generalized edema: Secondary | ICD-10-CM

## 2014-05-21 DIAGNOSIS — Z9114 Patient's other noncompliance with medication regimen: Secondary | ICD-10-CM | POA: Diagnosis present

## 2014-05-21 DIAGNOSIS — Z23 Encounter for immunization: Secondary | ICD-10-CM | POA: Diagnosis not present

## 2014-05-21 DIAGNOSIS — Z888 Allergy status to other drugs, medicaments and biological substances status: Secondary | ICD-10-CM

## 2014-05-21 DIAGNOSIS — R51 Headache: Secondary | ICD-10-CM | POA: Diagnosis present

## 2014-05-21 DIAGNOSIS — I509 Heart failure, unspecified: Secondary | ICD-10-CM

## 2014-05-21 DIAGNOSIS — G4730 Sleep apnea, unspecified: Secondary | ICD-10-CM

## 2014-05-21 DIAGNOSIS — G4733 Obstructive sleep apnea (adult) (pediatric): Secondary | ICD-10-CM | POA: Diagnosis present

## 2014-05-21 DIAGNOSIS — I429 Cardiomyopathy, unspecified: Secondary | ICD-10-CM | POA: Diagnosis present

## 2014-05-21 DIAGNOSIS — I5033 Acute on chronic diastolic (congestive) heart failure: Secondary | ICD-10-CM | POA: Diagnosis not present

## 2014-05-21 DIAGNOSIS — R9431 Abnormal electrocardiogram [ECG] [EKG]: Secondary | ICD-10-CM

## 2014-05-21 DIAGNOSIS — Z8249 Family history of ischemic heart disease and other diseases of the circulatory system: Secondary | ICD-10-CM | POA: Diagnosis not present

## 2014-05-21 DIAGNOSIS — L97829 Non-pressure chronic ulcer of other part of left lower leg with unspecified severity: Secondary | ICD-10-CM | POA: Diagnosis present

## 2014-05-21 DIAGNOSIS — N179 Acute kidney failure, unspecified: Secondary | ICD-10-CM | POA: Diagnosis present

## 2014-05-21 DIAGNOSIS — Z6841 Body Mass Index (BMI) 40.0 and over, adult: Secondary | ICD-10-CM | POA: Diagnosis not present

## 2014-05-21 DIAGNOSIS — I5042 Chronic combined systolic (congestive) and diastolic (congestive) heart failure: Secondary | ICD-10-CM

## 2014-05-21 DIAGNOSIS — E876 Hypokalemia: Secondary | ICD-10-CM | POA: Diagnosis not present

## 2014-05-21 DIAGNOSIS — I5021 Acute systolic (congestive) heart failure: Secondary | ICD-10-CM

## 2014-05-21 DIAGNOSIS — R0789 Other chest pain: Secondary | ICD-10-CM

## 2014-05-21 DIAGNOSIS — E785 Hyperlipidemia, unspecified: Secondary | ICD-10-CM | POA: Diagnosis present

## 2014-05-21 DIAGNOSIS — I10 Essential (primary) hypertension: Secondary | ICD-10-CM | POA: Diagnosis present

## 2014-05-21 DIAGNOSIS — I5043 Acute on chronic combined systolic (congestive) and diastolic (congestive) heart failure: Secondary | ICD-10-CM | POA: Diagnosis present

## 2014-05-21 DIAGNOSIS — R079 Chest pain, unspecified: Secondary | ICD-10-CM | POA: Diagnosis present

## 2014-05-21 DIAGNOSIS — L97819 Non-pressure chronic ulcer of other part of right lower leg with unspecified severity: Secondary | ICD-10-CM | POA: Diagnosis present

## 2014-05-21 DIAGNOSIS — Z79899 Other long term (current) drug therapy: Secondary | ICD-10-CM | POA: Diagnosis not present

## 2014-05-21 DIAGNOSIS — I5023 Acute on chronic systolic (congestive) heart failure: Secondary | ICD-10-CM | POA: Diagnosis not present

## 2014-05-21 HISTORY — DX: Other cardiomyopathies: I42.8

## 2014-05-21 HISTORY — DX: Chronic combined systolic (congestive) and diastolic (congestive) heart failure: I50.42

## 2014-05-21 LAB — COMPREHENSIVE METABOLIC PANEL
ALBUMIN: 3.8 g/dL (ref 3.5–5.2)
ALT: 40 U/L (ref 0–53)
ANION GAP: 14 (ref 5–15)
AST: 34 U/L (ref 0–37)
Alkaline Phosphatase: 48 U/L (ref 39–117)
BUN: 17 mg/dL (ref 6–23)
CALCIUM: 9.2 mg/dL (ref 8.4–10.5)
CO2: 26 mEq/L (ref 19–32)
CREATININE: 1.49 mg/dL — AB (ref 0.50–1.35)
Chloride: 100 mEq/L (ref 96–112)
GFR calc Af Amer: 70 mL/min — ABNORMAL LOW (ref >90)
GFR calc non Af Amer: 60 mL/min — ABNORMAL LOW (ref >90)
Glucose, Bld: 102 mg/dL — ABNORMAL HIGH (ref 70–99)
Potassium: 4.3 mEq/L (ref 3.7–5.3)
Sodium: 140 mEq/L (ref 137–147)
TOTAL PROTEIN: 7.2 g/dL (ref 6.0–8.3)
Total Bilirubin: 2.4 mg/dL — ABNORMAL HIGH (ref 0.3–1.2)

## 2014-05-21 LAB — CBC
HEMATOCRIT: 43.8 % (ref 39.0–52.0)
Hemoglobin: 14.2 g/dL (ref 13.0–17.0)
MCH: 27.9 pg (ref 26.0–34.0)
MCHC: 32.4 g/dL (ref 30.0–36.0)
MCV: 86.1 fL (ref 78.0–100.0)
PLATELETS: 266 10*3/uL (ref 150–400)
RBC: 5.09 MIL/uL (ref 4.22–5.81)
RDW: 13.3 % (ref 11.5–15.5)
WBC: 8.1 10*3/uL (ref 4.0–10.5)

## 2014-05-21 LAB — I-STAT TROPONIN, ED: TROPONIN I, POC: 0.05 ng/mL (ref 0.00–0.08)

## 2014-05-21 LAB — PRO B NATRIURETIC PEPTIDE: PRO B NATRI PEPTIDE: 1544 pg/mL — AB (ref 0–125)

## 2014-05-21 MED ORDER — LISINOPRIL 40 MG PO TABS
40.0000 mg | ORAL_TABLET | Freq: Every day | ORAL | Status: DC
  Administered 2014-05-22 – 2014-05-27 (×6): 40 mg via ORAL
  Filled 2014-05-21 (×6): qty 1

## 2014-05-21 MED ORDER — SODIUM CHLORIDE 0.9 % IJ SOLN: 3.0000 mL | INTRAMUSCULAR | Status: DC | PRN

## 2014-05-21 MED ORDER — AMLODIPINE BESYLATE 5 MG PO TABS
5.0000 mg | ORAL_TABLET | Freq: Once | ORAL | Status: AC
  Administered 2014-05-21: 5 mg via ORAL
  Filled 2014-05-21: qty 1

## 2014-05-21 MED ORDER — ATORVASTATIN CALCIUM 40 MG PO TABS
40.0000 mg | ORAL_TABLET | Freq: Every evening | ORAL | Status: DC
  Administered 2014-05-22 – 2014-05-26 (×5): 40 mg via ORAL
  Filled 2014-05-21 (×6): qty 1

## 2014-05-21 MED ORDER — HYDROCHLOROTHIAZIDE 12.5 MG PO CAPS
12.5000 mg | ORAL_CAPSULE | Freq: Every day | ORAL | Status: DC
  Administered 2014-05-22: 12.5 mg via ORAL
  Filled 2014-05-21: qty 1

## 2014-05-21 MED ORDER — ONDANSETRON HCL 4 MG/2ML IJ SOLN: 4.0000 mg | Freq: Four times a day (QID) | INTRAMUSCULAR | Status: DC | PRN

## 2014-05-21 MED ORDER — AMLODIPINE BESYLATE 5 MG PO TABS
5.0000 mg | ORAL_TABLET | Freq: Every day | ORAL | Status: DC
  Administered 2014-05-22 – 2014-05-23 (×2): 5 mg via ORAL
  Filled 2014-05-21 (×2): qty 1

## 2014-05-21 MED ORDER — ACETAMINOPHEN 325 MG PO TABS
650.0000 mg | ORAL_TABLET | ORAL | Status: DC | PRN
  Administered 2014-05-25: 650 mg via ORAL
  Filled 2014-05-21: qty 2

## 2014-05-21 MED ORDER — SODIUM CHLORIDE 0.9 % IJ SOLN
3.0000 mL | Freq: Two times a day (BID) | INTRAMUSCULAR | Status: DC
  Administered 2014-05-22 – 2014-05-27 (×11): 3 mL via INTRAVENOUS

## 2014-05-21 MED ORDER — SODIUM CHLORIDE 0.9 % IV SOLN: 250.0000 mL | INTRAVENOUS | Status: DC | PRN

## 2014-05-21 MED ORDER — HEPARIN SODIUM (PORCINE) 5000 UNIT/ML IJ SOLN
5000.0000 [IU] | Freq: Three times a day (TID) | INTRAMUSCULAR | Status: DC
  Administered 2014-05-22 (×2): 5000 [IU] via SUBCUTANEOUS
  Filled 2014-05-21 (×4): qty 1

## 2014-05-21 MED ORDER — ONDANSETRON HCL 4 MG/2ML IJ SOLN: 4.0000 mg | Freq: Three times a day (TID) | INTRAMUSCULAR | Status: DC | PRN

## 2014-05-21 MED ORDER — FUROSEMIDE 10 MG/ML IJ SOLN
40.0000 mg | INTRAMUSCULAR | Status: AC
  Administered 2014-05-21: 40 mg via INTRAVENOUS
  Filled 2014-05-21: qty 4

## 2014-05-21 NOTE — H&P (Signed)
Date: 05/21/2014               Patient Name:  Mark Baker MRN: [id number]  DOB: August 27, 1980 Age / Sex: 33 y.o., male   PCP: Rich Number, MD         Medical Service: Internal Medicine Teaching Service         Attending Physician: Dr. Levert Feinstein, MD    First Contact: Dr. Senaida Ores Pager: [phone]  Second Contact: Dr. Mikey Bussing Pager: [phone]       After Hours (After 5p/  First Contact Pager: [phone]  weekends / holidays): Second Contact Pager: [phone]   Chief Complaint: Fatigue and LE edema  History of Present Illness: Mark Baker is a 33 year old man with history of systolic and diastolic CHF (last echo 11/25/2012 LV EF 40-45%, grade 2 diastolic dysfunction on echo 02/2012), HTN, asthma, OSA not on CPAP presenting with increased fatigue and LE edema. He reports his LE edema has been increasing for the past couple weeks. He has also been having increased fatigue for the past 2-3 days. He reports he ran out of all of his medications 2 days ago. He stopped taking Coreg 1 year ago because it made him feel sick. He reports increased weight - 66 lbs over the past 4 months. He has worse fatigue with stairs and walking. He reports intermittent LUQ abdominal pain for the past 2 days that improves with drinking water and is worse with movement. Presently resolved. He has orthopnea at baseline. Denies fevers, chills, headache, lightheadedness, chest pain, shortness of breath, exertional chest pain/shortness of breath, PND, nausea, vomiting, diarrhea, dysuria, rash, paresthesias, weakness.   Meds: Medications Prior to Admission  Medication Sig Dispense Refill  . amLODipine (NORVASC) 5 MG tablet Take 5 mg by mouth every evening.      Marland Kitchen amLODipine (NORVASC) 5 MG tablet Take 5 mg by mouth daily.      Marland Kitchen atorvastatin (LIPITOR) 40 MG tablet Take 40 mg by mouth every evening.      . carvedilol (COREG) 12.5 MG tablet Take 12.5 mg by mouth 2 (two) times daily with a meal.      . furosemide (LASIX) 40  MG tablet Take 40 mg by mouth daily.      . furosemide (LASIX) 40 MG tablet Take 40 mg by mouth daily.      . hydrochlorothiazide (MICROZIDE) 12.5 MG capsule Take 12.5 mg by mouth daily.      Marland Kitchen lisinopril (PRINIVIL,ZESTRIL) 40 MG tablet Take 40 mg by mouth daily.      . sildenafil (VIAGRA) 50 MG tablet Take 50 mg by mouth daily as needed for erectile dysfunction.       Allergies: Allergies as of 05/21/2014 - Review Complete 05/21/2014  Allergen Reaction Noted  . Nitroglycerin Other (See Comments) 11/19/2012  . Aspirin Hives, Itching, and Rash 10/21/2012   Past Medical History  Diagnosis Date  . Hypertension   . Asthma     childhood asthma no exacerbation since age 31   . Headache(784.0)   . CHF (congestive heart failure)     systolic and diastolic    Past Surgical History  Procedure Laterality Date  . Anterior cruciate ligament repair      right   Family History  Problem Relation Age of Onset  . Hypertension Father   . Heart failure Father     paternal side of family   . Hypertension Sister   . Stroke Maternal Grandmother   . Hypertension  maternal side of family    History   Social History  . Marital Status: Married    Spouse Name: N/A    Number of Children: N/A  . Years of Education: N/A   Occupational History  . Not on file.   Social History Main Topics  . Smoking status: Never Smoker   . Smokeless tobacco: Never Used  . Alcohol Use: Yes     Comment: seldom.  . Drug Use: No  . Sexual Activity: Not on file   Other Topics Concern  . Not on file   Social History Narrative   [place] wife [place] 4 years.  Works for [employer] at [employer].  1 son and 2 daughters.     Denies cigarettes. Drank 1 beer last night 11/22/12. Denies drugs           Review of Systems: Constitutional: no fevers/chills Eyes: no vision changes Ears, nose, mouth, throat, and face: no cough Respiratory: no shortness of breath Cardiovascular: no chest pain Gastrointestinal:  no nausea/vomiting, +intermittent abdominal pain, no constipation, no diarrhea Genitourinary: no dysuria, no hematuria Integument: no rash Hematologic/lymphatic: no bleeding/bruising, +edema Musculoskeletal: no arthralgias, no myalgias Neurological: no paresthesias, no weakness  Physical Exam: Blood pressure 179/117, pulse 127, temperature 98.3 F (36.8 C), temperature source Oral, resp. rate 17, height 6\' 3"  (1.905 m), weight 483 lb 5 oz (219.229 kg), SpO2 94.00%. General Apperance: NAD Head: Normocephalic, atraumatic Eyes: PERRL, EOMI, anicteric sclera Ears: Normal external ear canal Nose: Nares normal, septum midline, mucosa normal Throat: Lips, mucosa and tongue normal  Neck: Supple, trachea midline Back: No tenderness or bony abnormality  Lungs: Clear to auscultation bilaterally. No wheezes, rhonchi or rales. Breathing comfortably Chest Wall: Nontender, no deformity Heart: Tachycardic rate and regular rhythm, no murmur/rub/gallop Abdomen: Soft, nontender, nondistended, no rebound/guarding Extremities: Normal, atraumatic, warm and well perfused, 2+ pitting edema to thighs and abdominal wall Pulses: 2+ throughout Skin: No rashes or lesions Neurologic: Alert and oriented x 3. CNII-XII intact. Normal strength and sensation  Lab results: Basic Metabolic Panel:  Recent Labs  16/05/9609/10/15 1939  NA 140  K 4.3  CL 100  CO2 26  GLUCOSE 102*  BUN 17  CREATININE 1.49*  CALCIUM 9.2   Liver Function Tests:  Recent Labs  05/21/14 1939  AST 34  ALT 40  ALKPHOS 48  BILITOT 2.4*  PROT 7.2  ALBUMIN 3.8   CBC:  Recent Labs  05/21/14 1939  WBC 8.1  HGB 14.2  HCT 43.8  MCV 86.1  PLT 266   Cardiac Enzymes:  Recent Labs  05/22/14 0015  TROPONINI <0.30  Troponin POC 05/21/2014 1949 0.05  BNP:  Recent Labs  05/21/14 1940  PROBNP 1544.0*    Imaging results:  Dg Chest 2 View  05/21/2014   CLINICAL DATA:  Shortness of breath and chest pain.  EXAM: CHEST  2 VIEW   COMPARISON:  None.  FINDINGS: The mediastinal contour is normal. The heart size is enlarged. There is pulmonary edema. There is no focal pneumonia or pleural effusion. No acute abnormalities identified within the visualized bones.  IMPRESSION: Congestive heart failure.   Electronically Signed   By: Sherian ReinWei-Chen  Lin M.D.   On: 05/21/2014 20:48    Other results: EKG: Sinus tachycardia, T wave normalization in III and aVF, T wave inversion in aVL, otherwise no changes compared to prior EKG.  Assessment & Plan by Problem: Principal Problem:   Anasarca Active Problems:   Sleep apnea  Morbid obesity   Systolic and diastolic CHF, chronic   Chest pain   CHF (congestive heart failure)  LE edema: likely acute on chronic systolic and diastolic heart failure. Last echo 11/25/2012 LV EF 40-45%, grade 2 diastolic dysfunction on echo 02/2012. proBNP 1544 (previous 113 11/24/2012) and chest XR with pulmonary edema. Differential of precipitating factors include dietary indiscretion, nonadherence to medication, ACS, arrhythmia, progression of underlying cardiac dysfunction, severe hypertension, renal failure, infection, PE. EKG without acute ischemic changes and troponin POC 0.05 in ED. No arrythmia on EKG. BP 170/111 on arrival. Cr elevated from baseline 1.49 from 1.1. Afebrile with no leukocytosis - unlikely to have infection. Wells score 1.5 points and Revised Geneva 5 points (tachycardia) - low and moderate risk respectively for PE. Received 40mg  IV lasix in the ED. Weight on presentation 483 - last weight in Epic 432 12/10/2012. -daily weights, strict i/o's -continue diuresis -Trend troponins -Repeat EKG in AM -Echo -Hgb A1c -Lipid panel -UDS  AKI: Cr elevated from baseline 1.49 from 1.1. Likely pre-renal 2/2 acutely decompensated heart failure -Continue to monitor  HTN: hypertensive on admission -continue home amlodipine 5mg  daily -continue HCTZ 12.5mg  daily -continue lisinopril 40mg   daily  HLD: -continue statin daily  FEN: heart healthy/carb modified diet  DVT ppx: subq heparin 5000u TID  Dispo: Disposition is deferred at this time, awaiting improvement of current medical problems. Anticipated discharge in approximately 2 day(s).   The patient does have a current PCP (Rich Number, MD) and does need an [facility] hospital follow-up appointment after discharge.  The patient does not have transportation limitations that hinder transportation to clinic appointments.  Signed: Griffin Basil, MD 05/21/2014, 10:17 PM

## 2014-05-21 NOTE — ED Notes (Addendum)
Pt presents with epigastric pain since yesterday- pain is worse with movement and ambulation.  Denies radiation of pain or N/V.  Pt appears short of breath with exertion in triage, lung sounds clear throughout.  Pt states he ran out of his medications yesterday and is unable to get them filled until Monday- last dose of medications was yesterday.

## 2014-05-21 NOTE — ED Provider Notes (Signed)
CSN: [id number]     Arrival date & time 05/21/14  1930 History   First MD Initiated Contact with Patient 05/21/14 2045     Chief Complaint  Patient presents with  . Chest Pain     (Consider location/radiation/quality/duration/timing/severity/associated sxs/prior Treatment) HPI Comments: 33 year old male, obese, nonischemic cardiomyopathy with congestive heart failure but normal coronary arteries as of one year ago by catheterization.  He has hx of htn and has been out of meds for at least 2 days - stopped coreg stating that it made him feel bad.  He is known to have CHF, has had Echo in 4/14 showing 40% EF with moderate systolic dysfunction.  He denies CP - states he has orthopnea and severe swelling in the legs and abdomen.  He is on 40mg  of lasix per day but again has not had this in a couple of days.  Sx are constant, gradually worsening.  No fevers, coughing, rashes, weakness.  Patient is a 33 y.o. male presenting with chest pain. The history is provided by the patient.  Chest Pain   Past Medical History  Diagnosis Date  . Hypertension   . Asthma     childhood asthma no exacerbation since age 33   . Headache(784.0)   . CHF (congestive heart failure)     systolic and diastolic    Past Surgical History  Procedure Laterality Date  . Anterior cruciate ligament repair      right   Family History  Problem Relation Age of Onset  . Hypertension Father   . Heart failure Father     paternal side of family   . Hypertension Sister   . Stroke Maternal Grandmother   . Hypertension      maternal side of family    History  Substance Use Topics  . Smoking status: Never Smoker   . Smokeless tobacco: Never Used  . Alcohol Use: Yes     Comment: seldom.    Review of Systems  Cardiovascular: Positive for chest pain.  All other systems reviewed and are negative.     Allergies  Nitroglycerin and Aspirin  Home Medications   Prior to Admission medications   Medication Sig  Start Date End Date Taking? Authorizing Provider  amLODipine (NORVASC) 5 MG tablet Take 5 mg by mouth every evening.   Yes Historical Provider, MD  amLODipine (NORVASC) 5 MG tablet Take 5 mg by mouth daily.   Yes Historical Provider, MD  atorvastatin (LIPITOR) 40 MG tablet Take 40 mg by mouth every evening.   Yes Historical Provider, MD  carvedilol (COREG) 12.5 MG tablet Take 12.5 mg by mouth 2 (two) times daily with a meal.   Yes Historical Provider, MD  furosemide (LASIX) 40 MG tablet Take 40 mg by mouth daily.   Yes Historical Provider, MD  furosemide (LASIX) 40 MG tablet Take 40 mg by mouth daily.   Yes Historical Provider, MD  hydrochlorothiazide (MICROZIDE) 12.5 MG capsule Take 12.5 mg by mouth daily.   Yes Historical Provider, MD  lisinopril (PRINIVIL,ZESTRIL) 40 MG tablet Take 40 mg by mouth daily.   Yes Historical Provider, MD  sildenafil (VIAGRA) 50 MG tablet Take 50 mg by mouth daily as needed for erectile dysfunction.   Yes Historical Provider, MD   BP 179/84  Pulse 110  Temp(Src) 98.3 F (36.8 C) (Oral)  Resp 20  Ht 6\' 3"  (1.905 m)  Wt 483 lb 5 oz (219.229 kg)  BMI 60.41 kg/m2  SpO2 91% Physical Exam  Nursing note and vitals reviewed. Constitutional: He appears well-developed and well-nourished. No distress.  HENT:  Head: Normocephalic and atraumatic.  Mouth/Throat: Oropharynx is clear and moist. No oropharyngeal exudate.  Eyes: Conjunctivae and EOM are normal. Pupils are equal, round, and reactive to light. Right eye exhibits no discharge. Left eye exhibits no discharge. No scleral icterus.  Neck: Normal range of motion. Neck supple. No JVD present. No thyromegaly present.  Cardiovascular: Regular rhythm, normal heart sounds and intact distal pulses.  Exam reveals no gallop and no friction rub.   No murmur heard. Sinus tachycardia of 105  Pulmonary/Chest: Effort normal and breath sounds normal. No respiratory distress. He has no wheezes. He has no rales.  Abdominal:  Soft. Bowel sounds are normal. He exhibits no distension and no mass. There is no tenderness.  No ttp but is morbidly obese and has anasarca to the umbilicus  Musculoskeletal: Normal range of motion. He exhibits edema ( severe bilateral LE edema pitting and symmetrical). He exhibits no tenderness.  Lymphadenopathy:    He has no cervical adenopathy.  Neurological: He is alert. Coordination normal.  Skin: Skin is warm and dry. No rash noted. No erythema.  Psychiatric: He has a normal mood and affect. His behavior is normal.    ED Course  Procedures (including critical care time) Labs Review Labs Reviewed  COMPREHENSIVE METABOLIC PANEL - Abnormal; Notable for the following:    Glucose, Bld 102 (*)    Creatinine, Ser 1.49 (*)    Total Bilirubin 2.4 (*)    GFR calc non Af Amer 60 (*)    GFR calc Af Amer 70 (*)    All other components within normal limits  PRO B NATRIURETIC PEPTIDE - Abnormal; Notable for the following:    Pro B Natriuretic peptide (BNP) 1544.0 (*)    All other components within normal limits  CBC  URINE RAPID DRUG SCREEN (HOSP PERFORMED)  I-STAT TROPOININ, ED    Imaging Review Dg Chest 2 View  05/21/2014   CLINICAL DATA:  Shortness of breath and chest pain.  EXAM: CHEST  2 VIEW  COMPARISON:  None.  FINDINGS: The mediastinal contour is normal. The heart size is enlarged. There is pulmonary edema. There is no focal pneumonia or pleural effusion. No acute abnormalities identified within the visualized bones.  IMPRESSION: Congestive heart failure.   Electronically Signed   By: Sherian Rein M.D.   On: 05/21/2014 20:48    ED ECG REPORT  I personally interpreted this EKG   Date: 05/21/2014   Rate: 111  Rhythm: sinus tachycardia  QRS Axis: normal  Intervals: normal  ST/T Wave abnormalities: normal  Conduction Disutrbances:none  Narrative Interpretation:   Old EKG Reviewed: unchanged   MDM   Final diagnoses:  Acute systolic congestive heart failure    The pt  reports being 486 today, was last weighed one year ago at 420. He clearly has significant fluid retention and is now in acute congestive heart failure. He will need diuresis, blood pressure control. His troponin is negative, BNP is elevated over 1500, normal blood counts and normal electrolytes. His renal function is not normal at creatinine of 1.5. Chest x-ray reveals bilateral pulmonary edema. He will get Norvasc, Lasix and admission to the hospital.  I discussed the patient's care with the cardiologist Dr. Antoine Poche who agrees with admission to the internal medicine resident service.  D/w Dr. Burtis Junes who will admit  Meds given in ED:  Medications  furosemide (LASIX) injection 40 mg (40  mg Intravenous Given 05/21/14 2146)  amLODipine (NORVASC) tablet 5 mg (5 mg Oral Given 05/21/14 2148)        Vida Roller, MD 05/21/14 2156

## 2014-05-22 DIAGNOSIS — I319 Disease of pericardium, unspecified: Secondary | ICD-10-CM

## 2014-05-22 DIAGNOSIS — G4730 Sleep apnea, unspecified: Secondary | ICD-10-CM

## 2014-05-22 DIAGNOSIS — I10 Essential (primary) hypertension: Secondary | ICD-10-CM

## 2014-05-22 LAB — CBC
HEMATOCRIT: 45.2 % (ref 39.0–52.0)
Hemoglobin: 14.7 g/dL (ref 13.0–17.0)
MCH: 28.6 pg (ref 26.0–34.0)
MCHC: 32.5 g/dL (ref 30.0–36.0)
MCV: 87.9 fL (ref 78.0–100.0)
PLATELETS: 256 10*3/uL (ref 150–400)
RBC: 5.14 MIL/uL (ref 4.22–5.81)
RDW: 13.5 % (ref 11.5–15.5)
WBC: 7.5 10*3/uL (ref 4.0–10.5)

## 2014-05-22 LAB — TROPONIN I
Troponin I: <0.3 ng/mL (ref <0.30)
Troponin I: <0.3 ng/mL (ref <0.30)

## 2014-05-22 LAB — HEMOGLOBIN A1C
Hgb A1c MFr Bld: 5.7 % — ABNORMAL HIGH (ref <5.7)
Mean Plasma Glucose: 117 mg/dL — ABNORMAL HIGH (ref <117)

## 2014-05-22 LAB — CREATININE, SERUM
Creatinine, Ser: 1.35 mg/dL (ref 0.50–1.35)
GFR calc Af Amer: 79 mL/min — ABNORMAL LOW (ref >90)
GFR calc non Af Amer: 68 mL/min — ABNORMAL LOW (ref >90)

## 2014-05-22 LAB — BASIC METABOLIC PANEL
ANION GAP: 13 (ref 5–15)
BUN: 16 mg/dL (ref 6–23)
CO2: 27 mEq/L (ref 19–32)
CREATININE: 1.33 mg/dL (ref 0.50–1.35)
Calcium: 9.4 mg/dL (ref 8.4–10.5)
Chloride: 101 mEq/L (ref 96–112)
GFR, EST AFRICAN AMERICAN: 80 mL/min — AB (ref >90)
GFR, EST NON AFRICAN AMERICAN: 69 mL/min — AB (ref >90)
Glucose, Bld: 100 mg/dL — ABNORMAL HIGH (ref 70–99)
POTASSIUM: 4.2 meq/L (ref 3.7–5.3)
Sodium: 141 mEq/L (ref 137–147)

## 2014-05-22 LAB — RAPID URINE DRUG SCREEN, HOSP PERFORMED
Amphetamines: NOT DETECTED
BARBITURATES: NOT DETECTED
BENZODIAZEPINES: NOT DETECTED
Cocaine: NOT DETECTED
Opiates: NOT DETECTED
TETRAHYDROCANNABINOL: NOT DETECTED

## 2014-05-22 LAB — LIPID PANEL
Cholesterol: 182 mg/dL (ref 0–200)
HDL: 32 mg/dL — AB (ref >39)
LDL Cholesterol: 126 mg/dL — ABNORMAL HIGH (ref 0–99)
TRIGLYCERIDES: 121 mg/dL (ref <150)
Total CHOL/HDL Ratio: 5.7 RATIO
VLDL: 24 mg/dL (ref 0–40)

## 2014-05-22 LAB — TSH: TSH: 0.535 u[IU]/mL (ref 0.350–4.500)

## 2014-05-22 LAB — MAGNESIUM: Magnesium: 1.9 mg/dL (ref 1.5–2.5)

## 2014-05-22 LAB — HIV ANTIBODY (ROUTINE TESTING W REFLEX): HIV 1&2 Ab, 4th Generation: NONREACTIVE

## 2014-05-22 MED ORDER — ENOXAPARIN SODIUM 40 MG/0.4ML ~~LOC~~ SOLN
40.0000 mg | Freq: Every day | SUBCUTANEOUS | Status: DC
  Administered 2014-05-22 – 2014-05-23 (×2): 40 mg via SUBCUTANEOUS
  Filled 2014-05-22 (×2): qty 0.4

## 2014-05-22 MED ORDER — POTASSIUM CHLORIDE CRYS ER 20 MEQ PO TBCR
20.0000 meq | EXTENDED_RELEASE_TABLET | Freq: Two times a day (BID) | ORAL | Status: DC
  Administered 2014-05-22 – 2014-05-27 (×11): 20 meq via ORAL
  Filled 2014-05-22 (×13): qty 1

## 2014-05-22 MED ORDER — FUROSEMIDE 10 MG/ML IJ SOLN
40.0000 mg | Freq: Two times a day (BID) | INTRAMUSCULAR | Status: DC
  Administered 2014-05-22 – 2014-05-25 (×8): 40 mg via INTRAVENOUS
  Filled 2014-05-22 (×9): qty 4

## 2014-05-22 MED ORDER — INFLUENZA VAC SPLIT QUAD 0.5 ML IM SUSY
0.5000 mL | PREFILLED_SYRINGE | INTRAMUSCULAR | Status: AC
  Administered 2014-05-23: 0.5 mL via INTRAMUSCULAR
  Filled 2014-05-22: qty 0.5

## 2014-05-22 MED ORDER — PNEUMOCOCCAL VAC POLYVALENT 25 MCG/0.5ML IJ INJ
0.5000 mL | INJECTION | INTRAMUSCULAR | Status: DC
  Filled 2014-05-22: qty 0.5

## 2014-05-22 NOTE — H&P (Signed)
Medicine attending admission note: I personally interviewed and examined this patient and I concur with the evaluation and management plan as recorded by resident physician Dr. Griffin Basil except as indicated below.  Clinical summary: 33 year old morbidly obese (500 pound) man with hypertensive cardiomyopathy. Last echocardiogram done 11/25/2012 with left ventricular ejection fraction 40-45%. Moderate dilation of the ventricular cavity. He has asthma and obstructive sleep apnea. He now presents with increasing fatigue and leg and pedal edema occurring over the last few weeks. An approximate 66 pound weight gain in the last 4 months. Of note he has not taken his medications for the last 2 days. Initial exam: Blood pressure 179/117, pulse 127 regular, temperature 98.3, respirations 17, oxygen saturation 94% on room air. Weight 483 pounds compared with 432 pounds recorded on an office visit 12/09/2012 Clear lungs. Rapid cardiac rhythm, regular rate, no murmur or gallop. Extremities with 3+ pitting edema to the thighs including abdominal wall  Pertinent lab: BUN 17, creatinine 1.5, hemoglobin 14, [place] count 8100, BNP 1544 Initial troponin undetectable  Electrocardiogram: Sinus tachycardia, no acute ischemic change. T waves inverted in aVL and biphasic in leads 3 and F. otherwise unchanged from prior tracing.  Chest radiograph: Cardiomegaly. Vascular congestion consistent with congestive heart failure.  Additional review of systems: He states that Coreg was stopped secondary to GI intolerance. He threw up every time he took a pill. Another beta blocker was not initiated.   current exam :Blood pressure 155/99, pulse 100, temperature 98.5 F (36.9 C), temperature source Oral, resp. rate 18, height 6\' 3"  (1.905 m), weight 470 lb 14.4 oz (213.6 kg), SpO2 97.00%.Marland Kitchen He is in no respiratory distress. Lungs presently clear to auscultation resonant to percussion throughout following 2 doses of  intravenous Lasix. Regular cardiac rhythm without murmur or gallop 3+ pitting edema feet legs and thighs.  Impression: Acute on chronic congestive heart failure No evidence for acute myocardial injury at this time however given his history we will cycle troponins, obtain serial EKGs, get a followup echocardiogram.  Parenteral diuretics  Resume antihypertensives and adjust. Add back a beta blocker but not Coreg secondary to GI intolerance.  Cephas Darby, MD, FACP  Hematology-Oncology/Internal Medicine

## 2014-05-22 NOTE — Progress Notes (Signed)
Subjective: Patient reporting he is urinating "like a racehorse."  Otherwise is feeling a little better.  He does have some off his pill bottles with him today (no Coreg, which he notes he stopped taking weeks ago), all of his other med bottles are empty. Objective: Vital signs in last 24 hours: Filed Vitals:   05/21/14 2256 05/21/14 2300 05/22/14 0012 05/22/14 0548  BP: 164/101 172/92 140/112 150/116  Pulse: 105 106 103 91  Temp:   98.6 F (37 C) 97.5 F (36.4 C)  TempSrc:   Oral Oral  Resp: 14 39 20 20  Height:   6\' 3"  (1.905 m)   Weight:   470 lb 14.4 oz (213.6 kg)   SpO2: 94% 95% 99% 96%   Weight change:   Intake/Output Summary (Last 24 hours) at 05/22/14 1203 Last data filed at 05/22/14 0900  Gross per 24 hour  Intake    380 ml  Output   4790 ml  Net  -4410 ml   General: resting in bed, morbidly obese HEENT: PERRL, EOMI Cardiac: RRR, no rubs, murmurs or gallops Pulm: clear to auscultation bilaterally Abd: obese, soft, nontender, nondistended, BS present Ext: warm and well perfused, 1-2+ pedal edema to knee bilaterally Neuro: alert and oriented X3, cranial nerves II-XII grossly intact   Wt Readings from Last 5 Encounters:  05/22/14 470 lb 14.4 oz (213.6 kg)  12/09/12 432 lb 14.4 oz (196.362 kg)  11/30/12 425 lb (192.779 kg)  11/25/12 424 lb 9.6 oz (192.597 kg)  11/25/12 424 lb 9.6 oz (192.597 kg)    Lab Results: Basic Metabolic Panel:  Recent Labs Lab 05/21/14 1939 05/22/14 0015 05/22/14 0606  NA 140  --  141  K 4.3  --  4.2  CL 100  --  101  CO2 26  --  27  GLUCOSE 102*  --  100*  BUN 17  --  16  CREATININE 1.49* 1.35 1.33  CALCIUM 9.2  --  9.4  MG  --  1.9  --    Liver Function Tests:  Recent Labs Lab 05/21/14 1939  AST 34  ALT 40  ALKPHOS 48  BILITOT 2.4*  PROT 7.2  ALBUMIN 3.8   No results found for this basename: LIPASE, AMYLASE,  in the last 168 hours No results found for this basename: AMMONIA,  in the last 168  hours CBC:  Recent Labs Lab 05/21/14 1939 05/22/14 0015  WBC 8.1 7.5  HGB 14.2 14.7  HCT 43.8 45.2  MCV 86.1 87.9  PLT 266 256   Cardiac Enzymes:  Recent Labs Lab 05/22/14 0015 05/22/14 0606  TROPONINI <0.30 <0.30   BNP:  Recent Labs Lab 05/21/14 1940  PROBNP 1544.0*   D-Dimer: No results found for this basename: DDIMER,  in the last 168 hours CBG: No results found for this basename: GLUCAP,  in the last 168 hours Hemoglobin A1C: No results found for this basename: HGBA1C,  in the last 168 hours Fasting Lipid Panel:  Recent Labs Lab 05/22/14 0606  CHOL 182  HDL 32*  LDLCALC 126*  TRIG 121  CHOLHDL 5.7   Thyroid Function Tests:  Recent Labs Lab 05/22/14 0015  TSH 0.535   Coagulation: No results found for this basename: LABPROT, INR,  in the last 168 hours Anemia Panel: No results found for this basename: VITAMINB12, FOLATE, FERRITIN, TIBC, IRON, RETICCTPCT,  in the last 168 hours Urine Drug Screen: Drugs of Abuse     Component Value Date/Time  LABOPIA NONE DETECTED 11/24/2012 0355   COCAINSCRNUR NONE DETECTED 11/24/2012 0355   LABBENZ NONE DETECTED 11/24/2012 0355   AMPHETMU NONE DETECTED 11/24/2012 0355   THCU NONE DETECTED 11/24/2012 0355   LABBARB NONE DETECTED 11/24/2012 0355    Alcohol Level: No results found for this basename: ETH,  in the last 168 hours Urinalysis: No results found for this basename: COLORURINE, APPERANCEUR, LABSPEC, PHURINE, GLUCOSEU, HGBUR, BILIRUBINUR, KETONESUR, PROTEINUR, UROBILINOGEN, NITRITE, LEUKOCYTESUR,  in the last 168 hours  Micro Results: No results found for this or any previous visit (from the past 240 hour(s)). Studies/Results: Dg Chest 2 View  05/21/2014   CLINICAL DATA:  Shortness of breath and chest pain.  EXAM: CHEST  2 VIEW  COMPARISON:  None.  FINDINGS: The mediastinal contour is normal. The heart size is enlarged. There is pulmonary edema. There is no focal pneumonia or pleural effusion. No acute  abnormalities identified within the visualized bones.  IMPRESSION: Congestive heart failure.   Electronically Signed   By: Sherian Rein M.D.   On: 05/21/2014 20:48   Medications: I have reviewed the patient's current medications. Scheduled Meds: . amLODipine  5 mg Oral Daily  . atorvastatin  40 mg Oral QPM  . furosemide  40 mg Intravenous BID  . heparin  5,000 Units Subcutaneous 3 times per day  . [START ON 05/23/2014] Influenza vac split quadrivalent PF  0.5 mL Intramuscular Tomorrow-1000  . lisinopril  40 mg Oral Daily  . [START ON 05/23/2014] pneumococcal 23 valent vaccine  0.5 mL Intramuscular Tomorrow-1000  . potassium chloride  20 mEq Oral BID  . sodium chloride  3 mL Intravenous Q12H   Continuous Infusions:  PRN Meds:.sodium chloride, acetaminophen, ondansetron (ZOFRAN) IV, sodium chloride Assessment/Plan:   Acute on chronic combined systolic and diastolic CHF (congestive heart failure) - Likely precipitated by medications noncompliance (stopped BB, ran out of other medications) - Repeat echo - Continue Lasix 40mg  IV BID - Monitor I/O  (net neg -4L yesterday) - Daily weights - D/C HCTZ - Continue Lisinopril 40 daily - Continue Amlodipine 5mg  daily - Hold off BB as not on prior to admission (at hospital follow up will need to restart Coreg versus starting Toprol XL    Sleep apnea - Not using CPAP as outpatinet    HTN (hypertension) - Lisinopril 40mg  daily - Amlodipine 5mg   ?AKI - SCr 1.35 today, appears baseline 1.1-1.2. - Continue to monitor  Change DVT PPx to Lovenox Dispo: Disposition is deferred at this time, awaiting improvement of current medical problems.  Anticipated discharge in approximately 3-4 day(s).   The patient does have a current PCP (Rich Number, MD) and does need an [facility] hospital follow-up appointment after discharge.  The patient does not have transportation limitations that hinder transportation to clinic appointments.  .Services Needed at time  of discharge: Y = Yes, Blank = No PT:   OT:   RN:   Equipment:   Other:     LOS: 1 day   Gust Rung, DO 05/22/2014, 12:03 PM

## 2014-05-22 NOTE — Progress Notes (Signed)
Pt. Family at the bedside visiting no concerns at this time.

## 2014-05-22 NOTE — Progress Notes (Signed)
  Echocardiogram 2D Echocardiogram has been performed.  [place]ian Co 05/22/2014, 9:27 AM

## 2014-05-23 ENCOUNTER — Encounter (HOSPITAL_COMMUNITY): Payer: Self-pay | Admitting: Anesthesiology

## 2014-05-23 ENCOUNTER — Encounter: Payer: Self-pay | Admitting: Internal Medicine

## 2014-05-23 DIAGNOSIS — I10 Essential (primary) hypertension: Secondary | ICD-10-CM

## 2014-05-23 DIAGNOSIS — E876 Hypokalemia: Secondary | ICD-10-CM

## 2014-05-23 DIAGNOSIS — I429 Cardiomyopathy, unspecified: Secondary | ICD-10-CM

## 2014-05-23 DIAGNOSIS — N179 Acute kidney failure, unspecified: Secondary | ICD-10-CM

## 2014-05-23 DIAGNOSIS — I5023 Acute on chronic systolic (congestive) heart failure: Secondary | ICD-10-CM

## 2014-05-23 LAB — BASIC METABOLIC PANEL
Anion gap: 13 (ref 5–15)
BUN: 16 mg/dL (ref 6–23)
CO2: 29 meq/L (ref 19–32)
CREATININE: 1.31 mg/dL (ref 0.50–1.35)
Calcium: 9.2 mg/dL (ref 8.4–10.5)
Chloride: 99 mEq/L (ref 96–112)
GFR calc Af Amer: 81 mL/min — ABNORMAL LOW (ref >90)
GFR calc non Af Amer: 70 mL/min — ABNORMAL LOW (ref >90)
Glucose, Bld: 115 mg/dL — ABNORMAL HIGH (ref 70–99)
POTASSIUM: 3.3 meq/L — AB (ref 3.7–5.3)
Sodium: 141 mEq/L (ref 137–147)

## 2014-05-23 MED ORDER — HYDRALAZINE HCL 25 MG PO TABS
12.5000 mg | ORAL_TABLET | Freq: Three times a day (TID) | ORAL | Status: DC
  Administered 2014-05-23 – 2014-05-24 (×5): 12.5 mg via ORAL
  Filled 2014-05-23 (×8): qty 0.5

## 2014-05-23 MED ORDER — HYDRALAZINE HCL 25 MG PO TABS
12.5000 mg | ORAL_TABLET | Freq: Three times a day (TID) | ORAL | Status: DC | PRN
  Filled 2014-05-23: qty 0.5

## 2014-05-23 MED ORDER — SPIRONOLACTONE 12.5 MG HALF TABLET
12.5000 mg | ORAL_TABLET | Freq: Every day | ORAL | Status: DC
  Administered 2014-05-23: 12.5 mg via ORAL
  Filled 2014-05-23 (×2): qty 1

## 2014-05-23 MED ORDER — HYDRALAZINE HCL 10 MG PO TABS
10.0000 mg | ORAL_TABLET | Freq: Three times a day (TID) | ORAL | Status: DC
  Administered 2014-05-23: 10 mg via ORAL
  Filled 2014-05-23 (×3): qty 1

## 2014-05-23 MED ORDER — POTASSIUM CHLORIDE CRYS ER 20 MEQ PO TBCR
40.0000 meq | EXTENDED_RELEASE_TABLET | Freq: Once | ORAL | Status: AC
  Administered 2014-05-23: 40 meq via ORAL
  Filled 2014-05-23: qty 2

## 2014-05-23 MED ORDER — HYDRALAZINE HCL 10 MG PO TABS
10.0000 mg | ORAL_TABLET | Freq: Three times a day (TID) | ORAL | Status: DC | PRN
  Filled 2014-05-23: qty 1

## 2014-05-23 MED ORDER — DIGOXIN 250 MCG PO TABS
0.2500 mg | ORAL_TABLET | Freq: Every day | ORAL | Status: DC
  Administered 2014-05-23 – 2014-05-27 (×5): 0.25 mg via ORAL
  Filled 2014-05-23 (×6): qty 1

## 2014-05-23 MED ORDER — ENOXAPARIN SODIUM 100 MG/ML ~~LOC~~ SOLN
100.0000 mg | SUBCUTANEOUS | Status: DC
  Administered 2014-05-24 – 2014-05-27 (×4): 100 mg via SUBCUTANEOUS
  Filled 2014-05-23 (×4): qty 1

## 2014-05-23 NOTE — Consult Note (Addendum)
Advanced Heart Failure Team History and Physical Note   Primary Physician:  Primary Cardiologist:  Dr. Melburn PopperNasher  Reason for Admission: A/C HF   HPI:    Mark Baker is a 33 yo male with a history of morbid obesity, chronic combined systolic/diastolic HF, NICM, HTN, asthma and OSA (does not wear CPAP).  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 (Ran 4.7sec 40 yards at that point). Diagnosed with NICM in 2014 in setting of severe HTN 230/190. Cath at that point with normal coronaries.  Presented to the hospital with increased SOB, fatigue and weight gain. Reports that he did not take medications for 2 days prior to admission and that he has not taken his coreg for over a year d/t GI intolerance. Weight up about 60 lbs (480 on admit) in the past year. Pertinent labs on admission were K+ 4.3, creatinine 1.49, pro-BNP 1544, Troponin 0.05, Mag 1.9, TSH normal and HIV non reactive.   ECHO: EF 20-25%, grade II DD, mild MR, RV mildly dilated and sys fx mod reduced  Review of Systems: [y] = yes, [ ]  = no   General: Weight gain [ Y]; Weight loss [ ] ; Anorexia [ ] ; Fatigue [Y ]; Fever [ ] ; Chills [ ] ; Weakness [ ]   Cardiac: Chest pain/pressure [ ] ; Resting SOB [ ] ; Exertional SOB [Y ]; Orthopnea [ Y]; Pedal Edema [Y ]; Palpitations [ ] ; Syncope [ ] ; Presyncope [ ] ; Paroxysmal nocturnal dyspnea[ ]   Pulmonary: Cough [ ] ; Wheezing[ ] ; Hemoptysis[ ] ; Sputum [ ] ; Snoring [ ]   GI: Vomiting[ ] ; Dysphagia[ ] ; Melena[ ] ; Hematochezia [ ] ; Heartburn[ ] ; Abdominal pain [ ] ; Constipation [ ] ; Diarrhea [ ] ; BRBPR [ ]   GU: Hematuria[ ] ; Dysuria [ ] ; Nocturia[ ]   Vascular: Pain in legs with walking [ ] ; Pain in feet with lying flat [ ] ; Non-healing sores [ ] ; Stroke [ ] ; TIA [ ] ; Slurred speech [ ] ;  Neuro: Headaches[ ] ; Vertigo[ ] ; Seizures[ ] ; Paresthesias[ ] ;Blurred vision [ ] ; Diplopia [ ] ; Vision changes [ ]   Ortho/Skin: Arthritis [ ] ; Joint pain [ Y]; Muscle pain [ ] ; Joint swelling [ ] ;  Back Pain [ ] ; Rash [ ]   Psych: Depression[ ] ; Anxiety[ ]   Heme: Bleeding problems [ ] ; Clotting disorders [ ] ; Anemia [ ]   Endocrine: Diabetes [ ] ; Thyroid dysfunction[ ]   Home Medications Prior to Admission medications   Medication Sig Start Date End Date Taking? Authorizing Provider  amLODipine (NORVASC) 5 MG tablet Take 5 mg by mouth every evening.   Yes Historical Provider, MD  amLODipine (NORVASC) 5 MG tablet Take 5 mg by mouth daily.   Yes Historical Provider, MD  atorvastatin (LIPITOR) 40 MG tablet Take 40 mg by mouth every evening.   Yes Historical Provider, MD  carvedilol (COREG) 12.5 MG tablet Take 12.5 mg by mouth 2 (two) times daily with a meal.   Yes Historical Provider, MD  furosemide (LASIX) 40 MG tablet Take 40 mg by mouth daily.   Yes Historical Provider, MD  furosemide (LASIX) 40 MG tablet Take 40 mg by mouth daily.   Yes Historical Provider, MD  hydrochlorothiazide (MICROZIDE) 12.5 MG capsule Take 12.5 mg by mouth daily.   Yes Historical Provider, MD  lisinopril (PRINIVIL,ZESTRIL) 40 MG tablet Take 40 mg by mouth daily.   Yes Historical Provider, MD  sildenafil (VIAGRA) 50 MG tablet Take 50 mg by mouth daily as needed for erectile dysfunction.   Yes Historical Provider,  MD    Past Medical History: Past Medical History  Diagnosis Date  . Hypertension   . Asthma     childhood asthma no exacerbation since age 34   . OSA (obstructive sleep apnea)   . Chronic combined systolic and diastolic CHF (congestive heart failure)     a) ECHO (11/2012): EF 40-45%, RV fx difficult to see, nl size b) ECHO (05/2014): EF 20-25%, grade 2 DD, RV mildly dilated and sys fx mod reduced  . NICM (nonischemic cardiomyopathy)     a) (11/2012): normal coronaries    Past Surgical History: Past Surgical History  Procedure Laterality Date  . Anterior cruciate ligament repair      right    Family History: Family History  Problem Relation Age of Onset  . Hypertension Father   . Heart  failure Father     paternal side of family   . Hypertension Sister   . Stroke Maternal Grandmother   . Hypertension      maternal side of family     Social History: History   Social History  . Marital Status: Married    Spouse Name: N/A    Number of Children: N/A  . Years of Education: N/A   Social History Main Topics  . Smoking status: Never Smoker   . Smokeless tobacco: Never Used  . Alcohol Use: Yes     Comment: seldom.  . Drug Use: No  . Sexual Activity: None   Other Topics Concern  . None   Social History Narrative   Lives in [place] with wife of 4 years.  Works for [employer] at [employer].  1 son and 2 daughters.     Denies cigarettes. Drank 1 beer last night 11/22/12. Denies drugs           Allergies:  Allergies  Allergen Reactions  . Nitroglycerin Other (See Comments)    Patient takes Viagra, so can't take nitro.   . Aspirin Hives, Itching and Rash    Objective:    Vital Signs:   Temp:  [98 F (36.7 C)-98.7 F (37.1 C)] 98 F (36.7 C) (10/12 0614) Pulse Rate:  [91-100] 97 (10/12 0614) Resp:  [17-21] 21 (10/12 0614) BP: (127-161)/(85-99) 127/89 mmHg (10/12 1110) SpO2:  [96 %-99 %] 96 % (10/12 0614) Weight:  [462 lb (209.562 kg)] 462 lb (209.562 kg) (10/12 0614) Last BM Date: 05/22/14 Filed Weights   05/21/14 1940 05/22/14 0012 05/23/14 1610  Weight: 483 lb 5 oz (219.229 kg) 470 lb 14.4 oz (213.6 kg) 462 lb (209.562 kg)    Physical Exam: General:  Well appearing. No resp difficulty, sitting on side of bed HEENT: normal Neck: supple. JVP difficult to see d/t body habitus but appears elevated' . Carotids 2+ bilat; no bruits. No lymphadenopathy or thryomegaly appreciated. Cor: PMI nondisplaced. Regular rate & rhythm. No rubs, gallops or murmurs. Lungs: clear Abdomen: Obese, soft, nontender, nondistended. No hepatosplenomegaly. No bruits or masses. Good bowel sounds. Extremities: no cyanosis, clubbing, rash, 2+ bilateral edema Neuro: alert & orientedx3,  cranial nerves grossly intact. moves all 4 extremities w/o difficulty. Affect pleasant  Telemetry: SR 90s  Labs: Basic Metabolic Panel:  Recent Labs Lab 05/21/14 1939 05/22/14 0015 05/22/14 0606 05/23/14 0355  NA 140  --  141 141  K 4.3  --  4.2 3.3*  CL 100  --  101 99  CO2 26  --  27 29  GLUCOSE 102*  --  100* 115*  BUN 17  --  16 16  CREATININE 1.49* 1.35 1.33 1.31  CALCIUM 9.2  --  9.4 9.2  MG  --  1.9  --   --     Liver Function Tests:  Recent Labs Lab 05/21/14 1939  AST 34  ALT 40  ALKPHOS 48  BILITOT 2.4*  PROT 7.2  ALBUMIN 3.8   No results found for this basename: LIPASE, AMYLASE,  in the last 168 hours No results found for this basename: AMMONIA,  in the last 168 hours  CBC:  Recent Labs Lab 05/21/14 1939 05/22/14 0015  WBC 8.1 7.5  HGB 14.2 14.7  HCT 43.8 45.2  MCV 86.1 87.9  PLT 266 256    Cardiac Enzymes:  Recent Labs Lab 05/22/14 0015 05/22/14 0606 05/22/14 1150  TROPONINI <0.30 <0.30 <0.30    BNP: BNP (last 3 results)  Recent Labs  05/21/14 1940  PROBNP 1544.0*    CBG: No results found for this basename: GLUCAP,  in the last 168 hours  Coagulation Studies: No results found for this basename: LABPROT, INR,  in the last 72 hours  Other results: EKG: SR  Imaging: Dg Chest 2 View  05/21/2014   CLINICAL DATA:  Shortness of breath and chest pain.  EXAM: CHEST  2 VIEW  COMPARISON:  None.  FINDINGS: The mediastinal contour is normal. The heart size is enlarged. There is pulmonary edema. There is no focal pneumonia or pleural effusion. No acute abnormalities identified within the visualized bones.  IMPRESSION: Congestive heart failure.   Electronically Signed   By: Sherian Rein M.D.   On: 05/21/2014 20:48        Assessment:   1) A/C systolic HF - EF 79-39%, RV moderately down 2) Morbidly Obese 3) OSA, does not wear CPAP 4) HTN 5) NICM  Plan/Discussion:    Mark Baker is a very pleasant 33 yo male who presented to  the hospital with A/C HF. He has a NICM likely related to HTN and OSA. His EF is down from 40-45% to 20-25%.   He appears to still be volume overloaded but is having good diuresis. Will continue IV lasix 40 mg IV BID and continue to follow renal function closely.   Will stop amlodpine and increase hydralazine to 12.5 mg TID. Not on Imdur d/t Viagra use.  On goal dose lisinopril. Start spiro 12.5 mg daily and digoxin 0.25 mg daily. Hold BB for now and will try to start low dose once diuresed.   He will need to be set up for CPAP on OP side. Had sleep study in the past but was never set up for machine.   Will need repeat ECHO in 3 months to reassess EF and if remains less than 35% will need referral to EP for ICD. QRS narrow, no CRT-D.   Length of Stay: 2 Mark Rud NP-C 05/23/2014, 12:26 PM  Advanced Heart Failure Team Pager [phone] (M-F; 7a - 4p)  Please contact [facility] for night-coverage after hours (4p -7a ) and weekends on [email or web address]  Patient seen and examined with Mark Potash, NP. We discussed all aspects of the encounter. I agree with the assessment and plan as stated above.   He presented with massive volume overload. Now responding well to IV lasix. LF much worse than previous - more dilated, EF down. But no evidence low output. Suspect hypertensive CM vs OSA-related. (Had sleep study in 2014 but never started CPAP). Less likely obesity-related CM. Suspect intolerance to carvedilol may be low  output issue.   For now would: 1) Continue IV lasix - likely has 2-3 more days of IV diuresis to go 2) Continue lisinopril - may be able to switch Entresto as outpatient 2) Increase hydralazine (no nitrates due to Viagra) 3) Add spironolactone and digoxin 4) Hold off on b-blocker for now 5) Will need to arrange for home CPAP (Primary team to arrange)  Long talk about timeline for recovery and possibility of worsening HF. We will follow closely. Check ANA, hepatitis panels,  ferritin for completeness.    Mark Boom Bensimhon,MD 12:43 PM

## 2014-05-23 NOTE — Progress Notes (Signed)
Patient ID: Mark Baker, male   DOB: Feb 10, 1981, 33 y.o.   MRN: [id number] Medicine Attending: I examined patient along with medical resident team. He is diuresing well. Unfortunately, follow up echocardiogram shows decline in ejection fraction compared with previous now 20-25% with grade 2 diastolic dysfunction. This may partly be due to his stopping coreg which he did not tolerate. We will ask for Cardiology opinion on management and specifically to address indications/need for an implantable defibrillator versus optimizing medical management on short term and deferring any mechanical device until re-evaluation of cardiac function in near future.  Cephas Darby, MD, FACP  Hematology-Oncology/Internal Medicine .

## 2014-05-23 NOTE — Progress Notes (Signed)
Subjective:  Patient was seen and examined this morning. He states his swelling has gone down some, but is not usually this swollen normally. He is urinating a good amount. He denies any chest pain, shortness of breath, cough, weakness, fatigue. Patient states he has been told in the past that his CHF was due to high blood pressure.  Objective: Vital signs in last 24 hours: Filed Vitals:   05/22/14 1330 05/22/14 2146 05/23/14 0154 05/23/14 0614  BP: 155/99 161/88 128/85 157/94  Pulse: 100 98 91 97  Temp: 98.5 F (36.9 C) 98 F (36.7 C) 98.7 F (37.1 C) 98 F (36.7 C)  TempSrc: Oral Oral Oral Oral  Resp: 18 17 20 21   Height:      Weight:    209.562 kg (462 lb)  SpO2: 97% 99% 98% 96%   Weight change: -9.667 kg (-21 lb 5 oz)  Intake/Output Summary (Last 24 hours) at 05/23/14 1013 Last data filed at 05/23/14 0614  Gross per 24 hour  Intake    480 ml  Output   2425 ml  Net  -1945 ml   Filed Vitals:   05/22/14 1330 05/22/14 2146 05/23/14 0154 05/23/14 0614  BP: 155/99 161/88 128/85 157/94  Pulse: 100 98 91 97  Temp: 98.5 F (36.9 C) 98 F (36.7 C) 98.7 F (37.1 C) 98 F (36.7 C)  TempSrc: Oral Oral Oral Oral  Resp: 18 17 20 21   Height:      Weight:    209.562 kg (462 lb)  SpO2: 97% 99% 98% 96%   General: Vital signs reviewed.  Patient is well-developed and well-nourished, in no acute distress and cooperative with exam.  Cardiovascular: RRR, S1 normal, S2 normal, no murmurs, gallops, or rubs. Pulmonary/Chest: Decreased breath sounds bilaterally, no wheezes, rales, or rhonchi. Abdominal: Soft, non-tender, obese, BS +, no masses, organomegaly, or guarding present.  Musculoskeletal: No joint deformities, erythema, or stiffness, ROM full and nontender. Skin: 1 cm ulcers in the anterior shins bilaterally without erythema or exudate. Psychiatric: Normal mood and affect. speech and behavior is normal. Cognition and memory are normal.   Lab Results: Basic Metabolic  Panel:  Recent Labs Lab 05/22/14 0015 05/22/14 0606 05/23/14 0355  NA  --  141 141  K  --  4.2 3.3*  CL  --  101 99  CO2  --  27 29  GLUCOSE  --  100* 115*  BUN  --  16 16  CREATININE 1.35 1.33 1.31  CALCIUM  --  9.4 9.2  MG 1.9  --   --    Liver Function Tests:  Recent Labs Lab 05/21/14 1939  AST 34  ALT 40  ALKPHOS 48  BILITOT 2.4*  PROT 7.2  ALBUMIN 3.8   CBC:  Recent Labs Lab 05/21/14 1939 05/22/14 0015  WBC 8.1 7.5  HGB 14.2 14.7  HCT 43.8 45.2  MCV 86.1 87.9  PLT 266 256   Cardiac Enzymes:  Recent Labs Lab 05/22/14 0015 05/22/14 0606 05/22/14 1150  TROPONINI <0.30 <0.30 <0.30   BNP:  Recent Labs Lab 05/21/14 1940  PROBNP 1544.0*   Hemoglobin A1C:  Recent Labs Lab 05/22/14 0606  HGBA1C 5.7*   Fasting Lipid Panel:  Recent Labs Lab 05/22/14 0606  CHOL 182  HDL 32*  LDLCALC 126*  TRIG 121  CHOLHDL 5.7   Thyroid Function Tests:  Recent Labs Lab 05/22/14 0015  TSH 0.535   Urine Drug Screen: Drugs of Abuse     Component  Value Date/Time   LABOPIA NONE DETECTED 05/22/2014 2137   COCAINSCRNUR NONE DETECTED 05/22/2014 2137   LABBENZ NONE DETECTED 05/22/2014 2137   AMPHETMU NONE DETECTED 05/22/2014 2137   THCU NONE DETECTED 05/22/2014 2137   LABBARB NONE DETECTED 05/22/2014 2137    Studies/Results: Dg Chest 2 View  05/21/2014   CLINICAL DATA:  Shortness of breath and chest pain.  EXAM: CHEST  2 VIEW  COMPARISON:  None.  FINDINGS: The mediastinal contour is normal. The heart size is enlarged. There is pulmonary edema. There is no focal pneumonia or pleural effusion. No acute abnormalities identified within the visualized bones.  IMPRESSION: Congestive heart failure.   Electronically Signed   By: Sherian Rein M.D.   On: 05/21/2014 20:48   Medications:  I have reviewed the patient's current medications. Prior to Admission:  Prescriptions prior to admission  Medication Sig Dispense Refill  . amLODipine (NORVASC) 5 MG tablet  Take 5 mg by mouth every evening.      Marland Kitchen amLODipine (NORVASC) 5 MG tablet Take 5 mg by mouth daily.      Marland Kitchen atorvastatin (LIPITOR) 40 MG tablet Take 40 mg by mouth every evening.      . carvedilol (COREG) 12.5 MG tablet Take 12.5 mg by mouth 2 (two) times daily with a meal.      . furosemide (LASIX) 40 MG tablet Take 40 mg by mouth daily.      . furosemide (LASIX) 40 MG tablet Take 40 mg by mouth daily.      . hydrochlorothiazide (MICROZIDE) 12.5 MG capsule Take 12.5 mg by mouth daily.      Marland Kitchen lisinopril (PRINIVIL,ZESTRIL) 40 MG tablet Take 40 mg by mouth daily.      . sildenafil (VIAGRA) 50 MG tablet Take 50 mg by mouth daily as needed for erectile dysfunction.       Scheduled Meds: . amLODipine  5 mg Oral Daily  . atorvastatin  40 mg Oral QPM  . enoxaparin (LOVENOX) injection  40 mg Subcutaneous Daily  . furosemide  40 mg Intravenous BID  . hydrALAZINE  10 mg Oral 3 times per day  . Influenza vac split quadrivalent PF  0.5 mL Intramuscular Tomorrow-1000  . lisinopril  40 mg Oral Daily  . pneumococcal 23 valent vaccine  0.5 mL Intramuscular Tomorrow-1000  . potassium chloride  20 mEq Oral BID  . sodium chloride  3 mL Intravenous Q12H   Continuous Infusions:  PRN Meds:.sodium chloride, acetaminophen, ondansetron (ZOFRAN) IV, sodium chloride Assessment/Plan: Principal Problem:   Acute on chronic combined systolic and diastolic CHF (congestive heart failure) Active Problems:   Sleep apnea   Morbid obesity   HTN (hypertension)  Acute on chronic combined systolic and diastolic CHF (congestive heart failure): Patient has been diuresing well. Out an additional 3 L yesterday for a total of 8 L from admission. Weight is down to 462 from 480s. Baseline around 420. Echo yesterday showed mild LVH with severe LV chamber dilatation, LVEF approximately 20%, grade 2 diastolic dysfunction, severe left atrial Enlargement, mild mitral regurgitation, moderate RV dysfunction and mild tricuspid  regurgitation with severe pulmonary hypertension. We have consulted heart failure team for further recommendations and to plug the patient back into the clinic. - Appreciated Cardiology recommendations - Continue Lasix 40mg  IV BID  - Monitor I/O (net neg -3 L yesterday)  - Daily weights  - Continue Lisinopril 40 daily  - Continue Amlodipine 5mg  daily  - Add hydralazine 10 mg q8h prn in setting  of hypertension - Hold off BB as not on prior to admission (at hospital follow up will need to restart Coreg versus starting Toprol XL)  Hypokalemia: Potassium was 3.3 this morning. We replaced it with KDur 40 mEq once. Patient is already on KCl 20 mEq BID.. -Kdur 40 mEq once -Continue KCl 20 mEq BID  Sleep apnea : Patient had a recent sleep study showing he required CPAP at home, but patient was unable to afford the machine. We will order a CPAP for use in house. -CPAP QHS  HTN (hypertension) : Blood pressure ranged from 128/85 to 161/88 overnight. We are holding home beta blocker due to acute CHF exacerbation off of beta blockers. We will avoid increasing amlodipine to avoid worsening lower extremity edema. -Lisinopril 40mg  daily  -Amlodipine 5mg   -Add Hydralazine 10 mg q8h prn -Consider starting Toprol XL at hospital follow up  AKI : SCr 1.49>1.35>1.33>1.31 today, appears baseline 1.1-1.2. Trending down. -Continue to monitor   DVT/PE ppx: Lovenox 40 mg daily  Dispo: Disposition is deferred at this time, awaiting improvement of current medical problems.  Anticipated discharge in approximately 1-2 day(s).   The patient does have a current PCP (Rich Number, MD) and does need an [facility] hospital follow-up appointment after discharge.  The patient does not have transportation limitations that hinder transportation to clinic appointments.  .Services Needed at time of discharge: Y = Yes, Blank = No PT:   OT:   RN:   Equipment:   Other:     LOS: 2 days   Jill Alexanders, DO PGY-1 Internal  Medicine Resident Pager # [phone] 05/23/2014 10:13 AM

## 2014-05-23 NOTE — Progress Notes (Signed)
Heart Failure Navigator Consult Note  Presentation: Mark Baker  is a 33 yo male with a history of morbid obesity, chronic combined systolic/diastolic HF, NICM, HTN, asthma and OSA (does not wear CPAP).  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 (Ran 4.7sec 40 yards at that point). Diagnosed with NICM in 2014 in setting of severe HTN 230/190. Cath at that point with normal coronaries.  Presented to the hospital with increased SOB, fatigue and weight gain. Reports that he did not take medications for 2 days prior to admission and that he has not taken his coreg for over a year d/t GI intolerance. Weight up about 60 lbs (480 on admit) in the past year. Pertinent labs on admission were K+ 4.3, creatinine 1.49, pro-BNP 1544, Troponin 0.05, Mag 1.9, TSH normal and HIV non reactive.    Past Medical History  Diagnosis Date  . Hypertension   . Asthma     childhood asthma no exacerbation since age 81   . OSA (obstructive sleep apnea)   . Chronic combined systolic and diastolic CHF (congestive heart failure)     a) ECHO (11/2012): EF 40-45%, RV fx difficult to see, nl size b) ECHO (05/2014): EF 20-25%, grade 2 DD, RV mildly dilated and sys fx mod reduced  . NICM (nonischemic cardiomyopathy)     a) (11/2012): normal coronaries    History   Social History  . Marital Status: Married    Spouse Name: N/A    Number of Children: N/A  . Years of Education: N/A   Social History Main Topics  . Smoking status: Never Smoker   . Smokeless tobacco: Never Used  . Alcohol Use: Yes     Comment: seldom.  . Drug Use: No  . Sexual Activity: None   Other Topics Concern  . None   Social History Narrative   Lives in [place] with wife of 4 years.  Works for [employer] at [employer].  1 son and 2 daughters.     Denies cigarettes. Drank 1 beer last night 11/22/12. Denies drugs           ECHO:Study Conclusions--05/22/14  - Left ventricle: The cavity size was severely dilated. Wall thickness  was increased in a pattern of mild LVH. Systolic function was severely reduced. The estimated ejection fraction was in the range of 20% to 25%. Diffuse hypokinesis with regional variation. Features are consistent with a pseudonormal left ventricular filling pattern, with concomitant abnormal relaxation and increased filling pressure (grade 2 diastolic dysfunction). - Mitral valve: There was mild regurgitation. - Left atrium: The atrium was severely dilated. - Right ventricle: The cavity size was mildly dilated. Systolic function was moderately reduced. - Right atrium: The atrium was moderately dilated. Central venous pressure (est): 15 mm Hg. - Tricuspid valve: There was mild regurgitation. - Pulmonary arteries: Systolic pressure was severely increased. PA peak pressure: 64 mm Hg (S). - Pericardium, extracardiac: A small pericardial effusion was identified along the base of the right atrium.  Impressions:  - Mild LVH with severe LV chamber dilatation, LVEF approximately 20%, grade 2 diastolic dysfunction. Severe left atrial enlargement. Mild mitral regurgitation. Moderate RV dysfunction. Mild tricuspid regurgitation with severe pulmonary hypertension, PASP 64 mmHg and elevated CVP. Small pericardial effusion.  Transthoracic echocardiography. M-mode, complete 2D, spectral Doppler, and color Doppler. Birthdate: Patient birthdate: 1980-10-22. Age: Patient is 33 yr old. Sex: Gender: male. BMI: 58.7 kg/m^2. Blood pressure: 150/116 Patient status: Inpatient. Study date:    BNP  Component Value Date/Time   PROBNP 1544.0* 05/21/2014 1940    Education Assessment and Provision:  Detailed education and instructions provided on heart failure disease management including the following:  Signs and symptoms of Heart Failure When to call the physician Importance of daily weights Low sodium diet Fluid restriction Medication management Anticipated future follow-up  appointments  Patient education given on each of the above topics.  Patient acknowledges understanding and acceptance of all instructions.  I spoke at length with Mark Baker regarding his HF.  He does not have a scale at home secondary to his size. (difficult to find a scale to accommodate his weight).  I will provide him a scale for use at home.  He admits that his biggest issue with HF recommendations is that he likes to drink a large amount of fluid.  He drinks up to 6-8 L of water per shift at work.  I encouraged him to use ice chips as possible and stressed the importance of reducing fluid intake.  We also spent some time going over foods high in sodium and foods to avoid.  He lives with his wife and 3 children ages 463 months -9 years.  He is motivated and seems realistic about his challenges.  Education Materials:  "Living Better With Heart Failure" Booklet, Daily Weight Tracker Tool and Heart Failure Educational Video.   High Risk Criteria for Readmission and/or Poor Patient Outcomes:  (Recommend Follow-up with Advanced Heart Failure Clinic)--yes- will follow up in the HF clinic as an outpatient.   EF <30%- yes 20-25%  2 or more admissions in 6 months- No  Difficult social situation- No  Demonstrates medication noncompliance- No   Barriers of Care:  Knowledge and compliance  Discharge Planning:   Plans to discharge to home with wife and family.

## 2014-05-24 DIAGNOSIS — I5023 Acute on chronic systolic (congestive) heart failure: Secondary | ICD-10-CM

## 2014-05-24 DIAGNOSIS — I5021 Acute systolic (congestive) heart failure: Secondary | ICD-10-CM

## 2014-05-24 DIAGNOSIS — I5033 Acute on chronic diastolic (congestive) heart failure: Secondary | ICD-10-CM

## 2014-05-24 LAB — BASIC METABOLIC PANEL
Anion gap: 15 (ref 5–15)
BUN: 19 mg/dL (ref 6–23)
CO2: 28 mEq/L (ref 19–32)
CREATININE: 1.25 mg/dL (ref 0.50–1.35)
Calcium: 9 mg/dL (ref 8.4–10.5)
Chloride: 101 mEq/L (ref 96–112)
GFR, EST AFRICAN AMERICAN: 86 mL/min — AB (ref >90)
GFR, EST NON AFRICAN AMERICAN: 74 mL/min — AB (ref >90)
GLUCOSE: 94 mg/dL (ref 70–99)
POTASSIUM: 3.9 meq/L (ref 3.7–5.3)
Sodium: 144 mEq/L (ref 137–147)

## 2014-05-24 LAB — HEPATITIS PANEL, ACUTE
HCV AB: NEGATIVE
Hep A IgM: NONREACTIVE
Hep B C IgM: NONREACTIVE
Hepatitis B Surface Ag: NEGATIVE

## 2014-05-24 LAB — FERRITIN: FERRITIN: 142 ng/mL (ref 22–322)

## 2014-05-24 MED ORDER — METOPROLOL SUCCINATE ER 25 MG PO TB24
25.0000 mg | ORAL_TABLET | Freq: Every day | ORAL | Status: DC
  Administered 2014-05-24: 25 mg via ORAL
  Filled 2014-05-24 (×2): qty 1

## 2014-05-24 MED ORDER — SPIRONOLACTONE 25 MG PO TABS
25.0000 mg | ORAL_TABLET | Freq: Every day | ORAL | Status: DC
  Administered 2014-05-24 – 2014-05-27 (×4): 25 mg via ORAL
  Filled 2014-05-24 (×4): qty 1

## 2014-05-24 NOTE — Progress Notes (Signed)
RT went to set up CPAP for pt, pt stated he was not ready to go on CPAP at that time. RT Informed pt to call RN when ready to go on CPAP and RT would come back when pt was ready to place on CPAP.

## 2014-05-24 NOTE — Progress Notes (Signed)
Subjective:  Patient was seen and examined this morning. Patient is doing well, he denies any complaints. He denies shortness of breath, chest pain, chest tightness, abdominal pain or nausea. Patient still feels more swollen in his legs than usual. He slept okay with his CPAP last night.  Objective: Vital signs in last 24 hours: Filed Vitals:   05/23/14 2135 05/24/14 0237 05/24/14 0602 05/24/14 1100  BP: 135/92  131/97 141/99  Pulse: 95 98 93 97  Temp: 98.2 F (36.8 C)  97.8 F (36.6 C) 97.9 F (36.6 C)  TempSrc: Oral  Oral Oral  Resp: 20 18 18 18   Height:      Weight:   207.475 kg (457 lb 6.4 oz)   SpO2: 97% 97% 96% 98%   Weight change: -2.087 kg (-4 lb 9.6 oz)  Intake/Output Summary (Last 24 hours) at 05/24/14 1443 Last data filed at 05/24/14 1118  Gross per 24 hour  Intake    960 ml  Output   2425 ml  Net  -1465 ml   General: Vital signs reviewed.  Patient is obese, in no acute distress and cooperative with exam.  Cardiovascular: RRR, S1 normal, S2 normal, no murmurs, gallops, or rubs. Pulmonary/Chest: Decreased breath sounds bilaterally, no wheezes, rales, or rhonchi. Abdominal: Soft, non-tender, obese, BS +, no masses, organomegaly, or guarding present.  Musculoskeletal: No joint deformities, erythema, or stiffness, ROM full and nontender. Skin: 1 cm ulcers in the anterior shins bilaterally without erythema or exudate. Psychiatric: Normal mood and affect. speech and behavior is normal. Cognition and memory are normal.   Lab Results: Basic Metabolic Panel:  Recent Labs Lab 05/22/14 0015  05/23/14 0355 05/24/14 0412  NA  --   < > 141 144  K  --   < > 3.3* 3.9  CL  --   < > 99 101  CO2  --   < > 29 28  GLUCOSE  --   < > 115* 94  BUN  --   < > 16 19  CREATININE 1.35  < > 1.31 1.25  CALCIUM  --   < > 9.2 9.0  MG 1.9  --   --   --   < > = values in this interval not displayed. Liver Function Tests:  Recent Labs Lab 05/21/14 1939  AST 34  ALT 40   ALKPHOS 48  BILITOT 2.4*  PROT 7.2  ALBUMIN 3.8   CBC:  Recent Labs Lab 05/21/14 1939 05/22/14 0015  WBC 8.1 7.5  HGB 14.2 14.7  HCT 43.8 45.2  MCV 86.1 87.9  PLT 266 256   Cardiac Enzymes:  Recent Labs Lab 05/22/14 0015 05/22/14 0606 05/22/14 1150  TROPONINI <0.30 <0.30 <0.30   BNP:  Recent Labs Lab 05/21/14 1940  PROBNP 1544.0*   Hemoglobin A1C:  Recent Labs Lab 05/22/14 0606  HGBA1C 5.7*   Fasting Lipid Panel:  Recent Labs Lab 05/22/14 0606  CHOL 182  HDL 32*  LDLCALC 126*  TRIG 121  CHOLHDL 5.7   Thyroid Function Tests:  Recent Labs Lab 05/22/14 0015  TSH 0.535   Urine Drug Screen: Drugs of Abuse     Component Value Date/Time   LABOPIA NONE DETECTED 05/22/2014 2137   COCAINSCRNUR NONE DETECTED 05/22/2014 2137   LABBENZ NONE DETECTED 05/22/2014 2137   AMPHETMU NONE DETECTED 05/22/2014 2137   THCU NONE DETECTED 05/22/2014 2137   LABBARB NONE DETECTED 05/22/2014 2137    Studies/Results: No results found. Medications:  I have  reviewed the patient's current medications. Prior to Admission:  Prescriptions prior to admission  Medication Sig Dispense Refill  . amLODipine (NORVASC) 5 MG tablet Take 5 mg by mouth every evening.      Marland Kitchen. amLODipine (NORVASC) 5 MG tablet Take 5 mg by mouth daily.      Marland Kitchen. atorvastatin (LIPITOR) 40 MG tablet Take 40 mg by mouth every evening.      . carvedilol (COREG) 12.5 MG tablet Take 12.5 mg by mouth 2 (two) times daily with a meal.      . furosemide (LASIX) 40 MG tablet Take 40 mg by mouth daily.      . furosemide (LASIX) 40 MG tablet Take 40 mg by mouth daily.      . hydrochlorothiazide (MICROZIDE) 12.5 MG capsule Take 12.5 mg by mouth daily.      Marland Kitchen. lisinopril (PRINIVIL,ZESTRIL) 40 MG tablet Take 40 mg by mouth daily.      . sildenafil (VIAGRA) 50 MG tablet Take 50 mg by mouth daily as needed for erectile dysfunction.       Scheduled Meds: . atorvastatin  40 mg Oral QPM  . digoxin  0.25 mg Oral Daily   . enoxaparin (LOVENOX) injection  100 mg Subcutaneous Q24H  . furosemide  40 mg Intravenous BID  . hydrALAZINE  12.5 mg Oral TID  . lisinopril  40 mg Oral Daily  . metoprolol succinate  25 mg Oral QHS  . pneumococcal 23 valent vaccine  0.5 mL Intramuscular Tomorrow-1000  . potassium chloride  20 mEq Oral BID  . sodium chloride  3 mL Intravenous Q12H  . spironolactone  25 mg Oral Daily   Continuous Infusions:  PRN Meds:.sodium chloride, acetaminophen, ondansetron (ZOFRAN) IV, sodium chloride Assessment/Plan: Principal Problem:   Acute on chronic combined systolic and diastolic CHF (congestive heart failure) Active Problems:   Sleep apnea   Morbid obesity   HTN (hypertension)  Acute on chronic combined systolic and diastolic CHF (EF 16-10%20-25% and Grade 2 DD): NICM is most likely due to uncontrolled hypertension and OSA. However, patient is very young. Ferritin 142, normal. Hep A IgM, Hep B surface antigen, Hep B core IgM and HCV antibody all negative and non-reactive. ANA pending. Patient continues to diurese well. Out an additional 2.5 L yesterday for a total of 10.4 L from admission. Weight is down to 457. BUN/Cr trending down and stable, 19/1.25. Patient was seen by Dr. Gala RomneyBensimhon, Cardiology yesterday who recommended 2-3 more day of IV lasix, continuing lisinopril, increasing hydralazine to 12.5 mg TID, spironolactone 25 mg daily, starting low dose metoprolol XL 25 mg QHS and digoxin 0.25 mg daily. We discontinued amlodipine. - Appreciated Cardiology recommendations - Continue Lasix 40mg  IV BID  - Monitor I/O  - Daily weights  - Lisinopril 40 daily  - Hydralazine 12.5 mg TID - Digoxin 0.25 mg daily - Metoprolol XL 25 mg QHS - Spironolactone 25 mg daily - ANA pending  Hypokalemia: Potassium was 3.9 this morning.  -Resolved -Continue KCl 20 mEq BID  Sleep apnea : Patient had trouble affording CPAP as outpatient. We will attempt to order one for outpatient and see if case management  can help. -CPAP QHS  HTN (hypertension) : Blood pressure ranged from 130s-140s systolic overnight.  -Lisinopril 40mg  daily  -Add Hydralazine 12.5 mg TID - Digoxin 0.25 mg daily - Metoprolol XL 25 mg QHS - Spironolactone 25 mg daily  AKI : SCr 1.49>1.35>1.33>1.31>1.25 today, appears baseline is 1.1-1.2. Trending down. -Continue to monitor  -BMET  tomorrow am  DVT/PE ppx: Lovenox 40 mg daily  Dispo: Disposition is deferred at this time, awaiting improvement of current medical problems.  Anticipated discharge in approximately 1-2 day(s).   The patient does have a current PCP (Rich Number, MD) and does need an [facility] hospital follow-up appointment after discharge.  The patient does not have transportation limitations that hinder transportation to clinic appointments.  .Services Needed at time of discharge: Y = Yes, Blank = No PT:   OT:   RN:   Equipment:   Other:     LOS: 3 days   Jill Alexanders, DO PGY-1 Internal Medicine Resident Pager # [phone] 05/24/2014 2:43 PM

## 2014-05-24 NOTE — Progress Notes (Signed)
UR complete.  Oma Marzan RN, MSN 

## 2014-05-24 NOTE — Progress Notes (Signed)
Advanced Heart Failure Rounding Note   Subjective:    33 y/o with morbid obesity, OSA, severe HTN admitted with ADHF and massive volume overload. EF 40% -> 20-25%  Continues to diurese well. Weight down another 5 pounds. (total 26 pounds). Denies dyspnea. Renal function stable. SBP 120-130s    Objective:   Weight Range:  Vital Signs:   Temp:  [97.7 F (36.5 C)-98.2 F (36.8 C)] 97.8 F (36.6 C) (10/13 0602) Pulse Rate:  [92-98] 93 (10/13 0602) Resp:  [18-20] 18 (10/13 0602) BP: (120-135)/(78-97) 131/97 mmHg (10/13 0602) SpO2:  [96 %-97 %] 96 % (10/13 0602) Weight:  [207.475 kg (457 lb 6.4 oz)] 207.475 kg (457 lb 6.4 oz) (10/13 0602) Last BM Date: 05/23/14  Weight change: Filed Weights   05/22/14 0012 05/23/14 0614 05/24/14 0602  Weight: 213.6 kg (470 lb 14.4 oz) 209.562 kg (462 lb) 207.475 kg (457 lb 6.4 oz)    Intake/Output:   Intake/Output Summary (Last 24 hours) at 05/24/14 0856 Last data filed at 05/24/14 0614  Gross per 24 hour  Intake   1440 ml  Output   4026 ml  Net  -2586 ml     General: Well appearing. No resp difficulty, sitting on side of bed  HEENT: normal  Neck: supple. JVP difficult to see d/t body habitus but appears elevated' . Carotids 2+ bilat; no bruits. No lymphadenopathy or thryomegaly appreciated.  Cor: PMI nondisplaced. Regular rate & rhythm. No rubs, gallops or murmurs.  Lungs: clear  Abdomen: Obese, soft, nontender, nondistended. No hepatosplenomegaly. No bruits or masses. Good bowel sounds.  Extremities: no cyanosis, clubbing, rash, 2+ bilateral edema  Neuro: alert & orientedx3, cranial nerves grossly intact. moves all 4 extremities w/o difficulty. Affect pleasant   Telemetry:  SR  Labs: Basic Metabolic Panel:  Recent Labs Lab 05/21/14 1939 05/22/14 0015 05/22/14 0606 05/23/14 0355 05/24/14 0412  NA 140  --  141 141 144  K 4.3  --  4.2 3.3* 3.9  CL 100  --  101 99 101  CO2 26  --  27 29 28   GLUCOSE 102*  --  100* 115* 94   BUN 17  --  16 16 19   CREATININE 1.49* 1.35 1.33 1.31 1.25  CALCIUM 9.2  --  9.4 9.2 9.0  MG  --  1.9  --   --   --     Liver Function Tests:  Recent Labs Lab 05/21/14 1939  AST 34  ALT 40  ALKPHOS 48  BILITOT 2.4*  PROT 7.2  ALBUMIN 3.8   No results found for this basename: LIPASE, AMYLASE,  in the last 168 hours No results found for this basename: AMMONIA,  in the last 168 hours  CBC:  Recent Labs Lab 05/21/14 1939 05/22/14 0015  WBC 8.1 7.5  HGB 14.2 14.7  HCT 43.8 45.2  MCV 86.1 87.9  PLT 266 256    Cardiac Enzymes:  Recent Labs Lab 05/22/14 0015 05/22/14 0606 05/22/14 1150  TROPONINI <0.30 <0.30 <0.30    BNP: BNP (last 3 results)  Recent Labs  05/21/14 1940  PROBNP 1544.0*     Other results:    Imaging:  No results found.   Medications:     Scheduled Medications: . atorvastatin  40 mg Oral QPM  . digoxin  0.25 mg Oral Daily  . enoxaparin (LOVENOX) injection  100 mg Subcutaneous Q24H  . furosemide  40 mg Intravenous BID  . hydrALAZINE  12.5 mg Oral TID  .  lisinopril  40 mg Oral Daily  . pneumococcal 23 valent vaccine  0.5 mL Intramuscular Tomorrow-1000  . potassium chloride  20 mEq Oral BID  . sodium chloride  3 mL Intravenous Q12H  . spironolactone  12.5 mg Oral Daily     Infusions:     PRN Medications:  sodium chloride, acetaminophen, ondansetron (ZOFRAN) IV, sodium chloride   Assessment:   1) A/C systolic HF  - EF 20-25%, RV moderately down  2) Morbidly Obese  3) OSA, does not wear CPAP  4) HTN  5) NICM   Plan/Discussion:     Doing well. Continue to diurese. Did not tolerate carvedilol in past. Will start low-dose Toprol at night. Likely has a couple more days of diuresis. Increase s[piro to 25 daily. We will follow.   Will ask Cardiac Rehab to see.    Length of Stay: 3 [name] MD 05/24/2014, 8:56 AM  Advanced Heart Failure Team Pager [phone] (M-F; 7a - 4p)  Please contact [facility] for night-coverage after hours (4p -7a ) and weekends on [email or web address]

## 2014-05-24 NOTE — Progress Notes (Signed)
I personally interviewed and examined this patient today.

## 2014-05-24 NOTE — Progress Notes (Signed)
Pt. Was placed on CPAP auto titrate (min: 5, max: 14) via nasal mask. Pt. Is tolerating CPAP well at this time without any complications.

## 2014-05-25 LAB — BASIC METABOLIC PANEL
ANION GAP: 13 (ref 5–15)
BUN: 17 mg/dL (ref 6–23)
CHLORIDE: 100 meq/L (ref 96–112)
CO2: 29 meq/L (ref 19–32)
CREATININE: 1.23 mg/dL (ref 0.50–1.35)
Calcium: 9.3 mg/dL (ref 8.4–10.5)
GFR calc non Af Amer: 76 mL/min — ABNORMAL LOW (ref >90)
GFR, EST AFRICAN AMERICAN: 88 mL/min — AB (ref >90)
Glucose, Bld: 95 mg/dL (ref 70–99)
Potassium: 3.9 mEq/L (ref 3.7–5.3)
Sodium: 142 mEq/L (ref 137–147)

## 2014-05-25 LAB — ANA: ANA: NEGATIVE

## 2014-05-25 MED ORDER — METOPROLOL SUCCINATE ER 50 MG PO TB24
50.0000 mg | ORAL_TABLET | Freq: Every day | ORAL | Status: DC
  Administered 2014-05-25 – 2014-05-26 (×2): 50 mg via ORAL
  Filled 2014-05-25 (×3): qty 1

## 2014-05-25 MED ORDER — HYDRALAZINE HCL 25 MG PO TABS
25.0000 mg | ORAL_TABLET | Freq: Three times a day (TID) | ORAL | Status: DC
Start: 2014-05-25 — End: 2014-05-26
  Administered 2014-05-25: 25 mg via ORAL
  Administered 2014-05-25: 17:00:00 via ORAL
  Administered 2014-05-25: 25 mg via ORAL
  Filled 2014-05-25 (×6): qty 1

## 2014-05-25 MED ORDER — METOLAZONE 5 MG PO TABS
5.0000 mg | ORAL_TABLET | Freq: Once | ORAL | Status: AC
  Administered 2014-05-25: 5 mg via ORAL
  Filled 2014-05-25: qty 1

## 2014-05-25 MED ORDER — FUROSEMIDE 10 MG/ML IJ SOLN
80.0000 mg | Freq: Two times a day (BID) | INTRAMUSCULAR | Status: DC
  Administered 2014-05-25 – 2014-05-27 (×4): 80 mg via INTRAVENOUS
  Filled 2014-05-25 (×5): qty 8

## 2014-05-25 NOTE — Progress Notes (Signed)
CARDIAC REHAB PHASE I   PRE:  Rate/Rhythm: 97 SR  BP:  Supine:   Sitting:   Standing:    SaO2: 97%RA  MODE:  Ambulation: 420 ft   POST:  Rate/Rhythm: 115 ST  BP:  Supine:   Sitting: 145/87  Standing:    SaO2: 94%RA 1410-1500 Pt walked 420 ft with steady gait. No SOB noted. Tolerated well. Pt able to answer teach back questions from previous CHF teaching. Has scale and knows when to notify MD with weight gain. Gave ex ed and discussed CRP 2. Will refer to [place] program. Pt can walk in hall independently.   Luetta Nutting, RN BSN  05/25/2014 2:57 PM

## 2014-05-25 NOTE — Progress Notes (Signed)
Internal Medicine Attending  Date: 05/25/2014  Patient name: Mark Baker Medical record number: [id number] Date of birth: Dec 15, 1980 Age: 33 y.o. Gender: male  I saw and evaluated the patient. I reviewed the resident's note by Dr. Senaida Ores and I agree with the resident's findings and plans as documented in her progress note.  We will increase diuresis until we see a slight bump in the BUN and creatinine suggesting we have reached dry weight. I suspect this may be a few days from now. We will also aggressively address his afterload and increase medications which have been proven to enhance survival in patients with cardiomyopathies. Finally, we will work on trying to arrange outpatient CPAP therapy for his obstructive sleep apnea in an attempt to improve long-term control of his heart failure.

## 2014-05-25 NOTE — Progress Notes (Signed)
Subjective:  Patient was seen and examined this morning. Patient denies any complaints- no chest pain, shortness of breath. Patient states the swelling in his legs and feet have not improved. He slept better last night with the full face CPAP as compared with the nasal CPAP.   Objective: Vital signs in last 24 hours: Filed Vitals:   05/25/14 0107 05/25/14 0153 05/25/14 0513 05/25/14 1025  BP:  150/94 155/92 145/93  Pulse: 99 99 93 90  Temp:  98.3 F (36.8 C) 97.8 F (36.6 C)   TempSrc:  Oral Oral   Resp: 16 18 18    Height:      Weight:   206.704 kg (455 lb 11.2 oz)   SpO2: 92% 95% 92%    Weight change: -0.771 kg (-1 lb 11.2 oz)  Intake/Output Summary (Last 24 hours) at 05/25/14 1151 Last data filed at 05/25/14 1024  Gross per 24 hour  Intake    633 ml  Output   3275 ml  Net  -2642 ml   General: Vital signs reviewed.  Patient is obese, in no acute distress and cooperative with exam.  Cardiovascular: RRR, S1 normal, S2 normal, no murmurs, gallops, or rubs. Pulmonary/Chest: Decreased breath sounds bilaterally, no wheezes, rales, or rhonchi. Abdominal: Soft, non-tender, obese, BS +, no masses, organomegaly, or guarding present.  Musculoskeletal: No joint deformities, erythema, or stiffness, ROM full and nontender. Extremities: 2-3+ pitting edema in his lower extremities bilaterally extending to his mid shins/calves bilaterally. Skin: 1 cm ulcers in the anterior shins bilaterally without erythema or exudate. Psychiatric: Normal mood and affect. speech and behavior is normal. Cognition and memory are normal.   Lab Results: Basic Metabolic Panel:  Recent Labs Lab 05/22/14 0015  05/24/14 0412 05/25/14 0311  NA  --   < > 144 142  K  --   < > 3.9 3.9  CL  --   < > 101 100  CO2  --   < > 28 29  GLUCOSE  --   < > 94 95  BUN  --   < > 19 17  CREATININE 1.35  < > 1.25 1.23  CALCIUM  --   < > 9.0 9.3  MG 1.9  --   --   --   < > = values in this interval not  displayed. Liver Function Tests:  Recent Labs Lab 05/21/14 1939  AST 34  ALT 40  ALKPHOS 48  BILITOT 2.4*  PROT 7.2  ALBUMIN 3.8   CBC:  Recent Labs Lab 05/21/14 1939 05/22/14 0015  WBC 8.1 7.5  HGB 14.2 14.7  HCT 43.8 45.2  MCV 86.1 87.9  PLT 266 256   Cardiac Enzymes:  Recent Labs Lab 05/22/14 0015 05/22/14 0606 05/22/14 1150  TROPONINI <0.30 <0.30 <0.30   BNP:  Recent Labs Lab 05/21/14 1940  PROBNP 1544.0*   Hemoglobin A1C:  Recent Labs Lab 05/22/14 0606  HGBA1C 5.7*   Fasting Lipid Panel:  Recent Labs Lab 05/22/14 0606  CHOL 182  HDL 32*  LDLCALC 126*  TRIG 121  CHOLHDL 5.7   Medications:  I have reviewed the patient's current medications. Prior to Admission:  Prescriptions prior to admission  Medication Sig Dispense Refill  . amLODipine (NORVASC) 5 MG tablet Take 5 mg by mouth every evening.      Marland Kitchen. amLODipine (NORVASC) 5 MG tablet Take 5 mg by mouth daily.      Marland Kitchen. atorvastatin (LIPITOR) 40 MG tablet Take 40 mg by  mouth every evening.      . carvedilol (COREG) 12.5 MG tablet Take 12.5 mg by mouth 2 (two) times daily with a meal.      . furosemide (LASIX) 40 MG tablet Take 40 mg by mouth daily.      . furosemide (LASIX) 40 MG tablet Take 40 mg by mouth daily.      . hydrochlorothiazide (MICROZIDE) 12.5 MG capsule Take 12.5 mg by mouth daily.      Marland Kitchen lisinopril (PRINIVIL,ZESTRIL) 40 MG tablet Take 40 mg by mouth daily.      . sildenafil (VIAGRA) 50 MG tablet Take 50 mg by mouth daily as needed for erectile dysfunction.       Scheduled Meds: . atorvastatin  40 mg Oral QPM  . digoxin  0.25 mg Oral Daily  . enoxaparin (LOVENOX) injection  100 mg Subcutaneous Q24H  . furosemide  80 mg Intravenous BID  . hydrALAZINE  25 mg Oral TID  . lisinopril  40 mg Oral Daily  . metoprolol succinate  50 mg Oral QHS  . pneumococcal 23 valent vaccine  0.5 mL Intramuscular Tomorrow-1000  . potassium chloride  20 mEq Oral BID  . sodium chloride  3 mL  Intravenous Q12H  . spironolactone  25 mg Oral Daily   Continuous Infusions:  PRN Meds:.sodium chloride, acetaminophen, ondansetron (ZOFRAN) IV, sodium chloride Assessment/Plan: Principal Problem:   Acute on chronic combined systolic and diastolic CHF (congestive heart failure) Active Problems:   Sleep apnea   Morbid obesity   HTN (hypertension)  Acute on chronic combined systolic and diastolic CHF (EF 24-81% and Grade 2 DD): Acute CHF exacerbation is secondary to medical noncompliance and untreated OSA. NICM is secondary to uncontrolled hypertension and OSA, diagnosed in 2014 when blood pressure was 230/190. Patient is very young and we could consider work up for amyloidosis, but patient has a clear cause of CHF. Patient continues to diurese well and is out an additional 2.7 L yesterday for a total of 13.3 L from admission. Weight is down to 455. BUN/Cr trending down and stable, 17/1.23. Patient continues to have lower extremity edema on exam. Heart Failure team recommends to increase lasix to 80 mg IV BID, increasing hydralazine to 25 mg TID and metolazone 5 mg. We have also increased metoprolol XL to 50 mg QHS. We will continue diuresing until BUN begins to increase.  - Heart Failure Team following - Continue Lasix 80 mg IV BID  - Monitor I/O  - Daily weights  - Lisinopril 40 daily  - Hydralazine 25 mg TID - Digoxin 0.25 mg daily - Metoprolol XL 50 mg QHS - Spironolactone 25 mg daily - Metolazone 5 mg once  Sleep apnea : Patient had trouble affording CPAP as outpatient. Patient given CPAP while inpatient. He has tolerated the full face mask better than the nasal mask. -CPAP QHS -Consult to Care Management for Home CPAP  HTN (hypertension) : Blood pressure ranged from 150/94-155/92 overnight. Patient is normally on amlodipine at home, but this was discontinued. We have increased metoprolol and hydralazine. - Lisinopril 40mg  daily  - Hydralazine 25 mg TID - Digoxin 0.25 mg daily -  Metoprolol XL 50 mg QHS - Spironolactone 25 mg daily  DVT/PE ppx: Lovenox 40 mg daily  Dispo: Disposition is deferred at this time, awaiting improvement of current medical problems.  Anticipated discharge in approximately 1-2 day(s).   The patient does have a current PCP (Rich Number, MD) and does need an [facility] hospital follow-up appointment  after discharge.  The patient does not have transportation limitations that hinder transportation to clinic appointments.  .Services Needed at time of discharge: Y = Yes, Blank = No PT:   OT:   RN:   Equipment:   Other:     LOS: 4 days   Jill Alexanders, DO PGY-1 Internal Medicine Resident Pager # [phone] 05/25/2014 11:51 AM

## 2014-05-25 NOTE — Progress Notes (Signed)
Advanced Heart Failure Rounding Note   Subjective:    33 y/o with morbid obesity, OSA, severe HTN admitted with ADHF and massive volume overload. EF 40% -> 20-25%  Continues to diurese well, weight down another 2 lbs. 24 hr I/O -2.7 liters. Denies SOB, orthopnea or CP. Renal stable. SBP 150s. Started Toprol yesterday and increased Spironolactone.    Objective:   Weight Range:  Vital Signs:   Temp:  [97.8 F (36.6 C)-98.3 F (36.8 C)] 97.8 F (36.6 C) (10/14 0513) Pulse Rate:  [93-105] 93 (10/14 0513) Resp:  [16-18] 18 (10/14 0513) BP: (131-155)/(84-99) 155/92 mmHg (10/14 0513) SpO2:  [90 %-98 %] 92 % (10/14 0513) Weight:  [455 lb 11.2 oz (206.704 kg)] 455 lb 11.2 oz (206.704 kg) (10/14 0513) Last BM Date: 05/24/14  Weight change: Filed Weights   05/23/14 0614 05/24/14 0602 05/25/14 0513  Weight: 462 lb (209.562 kg) 457 lb 6.4 oz (207.475 kg) 455 lb 11.2 oz (206.704 kg)    Intake/Output:   Intake/Output Summary (Last 24 hours) at 05/25/14 0824 Last data filed at 05/25/14 0602  Gross per 24 hour  Intake    510 ml  Output   3275 ml  Net  -2765 ml     General: Well appearing. No resp difficulty, sitting on side of bed  HEENT: normal  Neck: supple. JVP difficult to see d/t body habitus but appears elevated' . Carotids 2+ bilat; no bruits. No lymphadenopathy or thryomegaly appreciated.  Cor: PMI nondisplaced. Regular rate & rhythm. No rubs, gallops or murmurs.  Lungs: clear  Abdomen: Obese, soft, nontender, nondistended. No hepatosplenomegaly. No bruits or masses. Good bowel sounds.  Extremities: no cyanosis, clubbing, rash, 2+ bilateral edema  Neuro: alert & orientedx3, cranial nerves grossly intact. moves all 4 extremities w/o difficulty. Affect pleasant   Telemetry:  SR  Labs: Basic Metabolic Panel:  Recent Labs Lab 05/21/14 1939 05/22/14 0015 05/22/14 0606 05/23/14 0355 05/24/14 0412 05/25/14 0311  NA 140  --  141 141 144 142  K 4.3  --  4.2 3.3* 3.9 3.9   CL 100  --  101 99 101 100  CO2 26  --  27 29 28 29   GLUCOSE 102*  --  100* 115* 94 95  BUN 17  --  16 16 19 17   CREATININE 1.49* 1.35 1.33 1.31 1.25 1.23  CALCIUM 9.2  --  9.4 9.2 9.0 9.3  MG  --  1.9  --   --   --   --     Liver Function Tests:  Recent Labs Lab 05/21/14 1939  AST 34  ALT 40  ALKPHOS 48  BILITOT 2.4*  PROT 7.2  ALBUMIN 3.8   No results found for this basename: LIPASE, AMYLASE,  in the last 168 hours No results found for this basename: AMMONIA,  in the last 168 hours  CBC:  Recent Labs Lab 05/21/14 1939 05/22/14 0015  WBC 8.1 7.5  HGB 14.2 14.7  HCT 43.8 45.2  MCV 86.1 87.9  PLT 266 256    Cardiac Enzymes:  Recent Labs Lab 05/22/14 0015 05/22/14 0606 05/22/14 1150  TROPONINI <0.30 <0.30 <0.30    BNP: BNP (last 3 results)  Recent Labs  05/21/14 1940  PROBNP 1544.0*     Other results:    Imaging: No results found.   Medications:     Scheduled Medications: . atorvastatin  40 mg Oral QPM  . digoxin  0.25 mg Oral Daily  . enoxaparin (LOVENOX) injection  100 mg Subcutaneous Q24H  . furosemide  40 mg Intravenous BID  . hydrALAZINE  12.5 mg Oral TID  . lisinopril  40 mg Oral Daily  . metoprolol succinate  25 mg Oral QHS  . pneumococcal 23 valent vaccine  0.5 mL Intramuscular Tomorrow-1000  . potassium chloride  20 mEq Oral BID  . sodium chloride  3 mL Intravenous Q12H  . spironolactone  25 mg Oral Daily    Infusions:    PRN Medications: sodium chloride, acetaminophen, ondansetron (ZOFRAN) IV, sodium chloride   Assessment:   1) A/C systolic HF  - EF 20-25%, RV moderately down  2) Morbidly Obese  3) OSA, does not wear CPAP  4) HTN  5) NICM   Plan/Discussion:    Continues to diurese well and renal function stable. Will increase lasix to 80 mg BID with a dose of 5 mg metolazone. Will continue diuresis until renal function bumps. Will increase hydralazine to 25 mg TID. He is not on Imdur d/t Viagra use. Will  also increase Toprol to 50 mg daily.   Continue to ambulate with CR.    Length of Stay: 4 Aundria RudCosgrove, Ali B NP-C 05/25/2014, 8:24 AM  Advanced Heart Failure Team Pager [phone] (M-F; 7a - 4p)  Please contact [facility] for night-coverage after hours (4p -7a ) and weekends on [email or web address]  Patient seen and examined with Ulla PotashAli Cosgrove, NP. We discussed all aspects of the encounter. I agree with the assessment and plan as stated above.   He remains significantly volume overloaded. Increase IV lasix and add metolazone. Titrate HF meds. He will need CPAP arranged as outpatient prior to d/c.  Unable to tolerate nitrates due to HA.  Daniel Bensimhon,MD 9:05 AM

## 2014-05-26 DIAGNOSIS — I5043 Acute on chronic combined systolic (congestive) and diastolic (congestive) heart failure: Principal | ICD-10-CM

## 2014-05-26 LAB — BASIC METABOLIC PANEL
ANION GAP: 14 (ref 5–15)
Anion gap: 12 (ref 5–15)
BUN: 17 mg/dL (ref 6–23)
BUN: 17 mg/dL (ref 6–23)
CALCIUM: 9.8 mg/dL (ref 8.4–10.5)
CALCIUM: 9.9 mg/dL (ref 8.4–10.5)
CHLORIDE: 96 meq/L (ref 96–112)
CO2: 30 mEq/L (ref 19–32)
CO2: 31 mEq/L (ref 19–32)
CREATININE: 1.32 mg/dL (ref 0.50–1.35)
Chloride: 96 mEq/L (ref 96–112)
Creatinine, Ser: 1.34 mg/dL (ref 0.50–1.35)
GFR calc non Af Amer: 70 mL/min — ABNORMAL LOW (ref >90)
GFR calc non Af Amer: 68 mL/min — ABNORMAL LOW (ref >90)
GFR, EST AFRICAN AMERICAN: 81 mL/min — AB (ref >90)
GFR, EST AFRICAN AMERICAN: 79 mL/min — AB (ref >90)
Glucose, Bld: 87 mg/dL (ref 70–99)
Glucose, Bld: 94 mg/dL (ref 70–99)
Potassium: 3.9 mEq/L (ref 3.7–5.3)
Potassium: 4.2 mEq/L (ref 3.7–5.3)
SODIUM: 140 meq/L (ref 137–147)
SODIUM: 139 meq/L (ref 137–147)

## 2014-05-26 LAB — DIGOXIN LEVEL: DIGOXIN LVL: 0.4 ng/mL — AB (ref 0.8–2.0)

## 2014-05-26 MED ORDER — HYDRALAZINE HCL 25 MG PO TABS
37.5000 mg | ORAL_TABLET | Freq: Three times a day (TID) | ORAL | Status: DC
  Administered 2014-05-26 (×3): 37.5 mg via ORAL
  Filled 2014-05-26 (×6): qty 1.5

## 2014-05-26 MED ORDER — METOLAZONE 5 MG PO TABS
5.0000 mg | ORAL_TABLET | Freq: Once | ORAL | Status: AC
  Administered 2014-05-26: 5 mg via ORAL
  Filled 2014-05-26: qty 1

## 2014-05-26 NOTE — Progress Notes (Signed)
Nutrition Education Note  RD consulted for nutrition education.  Lipid Panel     Component Value Date/Time   CHOL 182 05/22/2014 0606   TRIG 121 05/22/2014 0606   HDL 32* 05/22/2014 0606   CHOLHDL 5.7 05/22/2014 0606   VLDL 24 05/22/2014 0606   LDLCALC 126* 05/22/2014 0606   Pt denies any diet indiscretion recently; he reports following a low sodium diet PTA and denies any diet education needs.  Pt accepted "Heart Healthy Nutrition Therapy" handout from the [employer] of [employer]. Reviewed patient's dietary recall. Provided examples on ways to decrease sodium in diet. Discouraged intake of processed foods and use of salt shaker. Encouraged fresh fruits and vegetables as well as whole grain sources of carbohydrates to maximize fiber intake. Provided diet tips for lowering LDL cholesterol and raising HDL cholesterol.   Expect fair compliance.  Body mass index is 55.5 kg/(m^2). Pt meets criteria for Morbid Obesity based on current BMI.  Current diet order is Heart Healthy/Carb Modified, patient is consuming approximately 100% of meals at this time. Labs and medications reviewed. No further nutrition interventions warranted at this time.  If additional nutrition issues arise, please re-consult RD.  Ian Malkin RD, LDN Inpatient Clinical Dietitian Pager: [phone] After Hours Pager: [phone]

## 2014-05-26 NOTE — Progress Notes (Signed)
Medicine attending: I personally examined this patient today together with resident physician Dr. Amaryllis Dyke and I concur with her evaluation and management plan. He continues to improve. His diureses well. Medication recommendations by cardiology have been instituted. Anticipate transition to oral diuretics and discharged soon.  Cephas Darby, M.D., FACP

## 2014-05-26 NOTE — Care Management Note (Signed)
Pt had sleep study April of last year...was not able to afford CPAP and did not purchase.[facility] notified and will look into providing one for this pt. Deshannon Seide J. Lucretia Roers, RN, BSN, [employer] [phone]

## 2014-05-26 NOTE — Plan of Care (Signed)
Problem: Phase I Progression Outcomes Goal: EF % per last Echo/documented,Core Reminder form on chart Outcome: Completed/Met Date Met:  05/26/14 EF = 20-25% via ECHO performed on 05/22/14.

## 2014-05-26 NOTE — Progress Notes (Signed)
Subjective:  Patient was seen and examined this morning. Patient is doing well this morning. Continues to urinate a good amount and diuresing well. No complaints of chest pain, shortness of breath, nausea, orthopnea. Patient's lower extremities and feet remain more swollen than normal.  Objective: Vital signs in last 24 hours: Filed Vitals:   05/25/14 1817 05/25/14 2122 05/26/14 0615 05/26/14 1014  BP: 141/84 146/82 127/86 138/101  Pulse: 99 99 84   Temp: 98.2 F (36.8 C) 98.1 F (36.7 C) 98 F (36.7 C) 98 F (36.7 C)  TempSrc: Oral Oral Oral Oral  Resp: 18 18 18 18   Height:      Weight:   201.397 kg (444 lb)   SpO2: 98% 97% 92%    Weight change: -5.307 kg (-11 lb 11.2 oz)  Intake/Output Summary (Last 24 hours) at 05/26/14 1056 Last data filed at 05/26/14 0615  Gross per 24 hour  Intake    440 ml  Output   7110 ml  Net  -6670 ml   General: Vital signs reviewed.  Patient is obese, in no acute distress and cooperative with exam.  Cardiovascular: RRR, S1 normal, S2 normal, no murmurs, gallops, or rubs. Pulmonary/Chest: Decreased breath sounds bilaterally, no wheezes, rales, or rhonchi. Abdominal: Soft, non-tender, obese, BS +, no masses, organomegaly, or guarding present.  Musculoskeletal: No joint deformities, erythema, or stiffness, ROM full and nontender. Extremities: 2+ pitting edema in his lower extremities bilaterally extending to his mid shins/calves bilaterally. Skin: 1 cm ulcers in the anterior shins bilaterally without erythema or exudate. Hard area of induration on left medial shin-nontender, not concerning for DVT. Psychiatric: Normal mood and affect. speech and behavior is normal. Cognition and memory are normal.   Lab Results: Basic Metabolic Panel:  Recent Labs Lab 05/22/14 0015  05/26/14 0656 05/26/14 0910  NA  --   < > 140 139  K  --   < > 3.9 4.2  CL  --   < > 96 96  CO2  --   < > 30 31  GLUCOSE  --   < > 87 94  BUN  --   < > 17 17  CREATININE  1.35  < > 1.32 1.34  CALCIUM  --   < > 9.8 9.9  MG 1.9  --   --   --   < > = values in this interval not displayed. Liver Function Tests:  Recent Labs Lab 05/21/14 1939  AST 34  ALT 40  ALKPHOS 48  BILITOT 2.4*  PROT 7.2  ALBUMIN 3.8   CBC:  Recent Labs Lab 05/21/14 1939 05/22/14 0015  WBC 8.1 7.5  HGB 14.2 14.7  HCT 43.8 45.2  MCV 86.1 87.9  PLT 266 256   Cardiac Enzymes:  Recent Labs Lab 05/22/14 0015 05/22/14 0606 05/22/14 1150  TROPONINI <0.30 <0.30 <0.30   BNP:  Recent Labs Lab 05/21/14 1940  PROBNP 1544.0*   Hemoglobin A1C:  Recent Labs Lab 05/22/14 0606  HGBA1C 5.7*   Fasting Lipid Panel:  Recent Labs Lab 05/22/14 0606  CHOL 182  HDL 32*  LDLCALC 126*  TRIG 121  CHOLHDL 5.7   Medications:  I have reviewed the patient's current medications. Prior to Admission:  Prescriptions prior to admission  Medication Sig Dispense Refill  . amLODipine (NORVASC) 5 MG tablet Take 5 mg by mouth every evening.      Marland Kitchen amLODipine (NORVASC) 5 MG tablet Take 5 mg by mouth daily.      Marland Kitchen  atorvastatin (LIPITOR) 40 MG tablet Take 40 mg by mouth every evening.      . carvedilol (COREG) 12.5 MG tablet Take 12.5 mg by mouth 2 (two) times daily with a meal.      . furosemide (LASIX) 40 MG tablet Take 40 mg by mouth daily.      . furosemide (LASIX) 40 MG tablet Take 40 mg by mouth daily.      . hydrochlorothiazide (MICROZIDE) 12.5 MG capsule Take 12.5 mg by mouth daily.      Marland Kitchen. lisinopril (PRINIVIL,ZESTRIL) 40 MG tablet Take 40 mg by mouth daily.      . sildenafil (VIAGRA) 50 MG tablet Take 50 mg by mouth daily as needed for erectile dysfunction.       Scheduled Meds: . atorvastatin  40 mg Oral QPM  . digoxin  0.25 mg Oral Daily  . enoxaparin (LOVENOX) injection  100 mg Subcutaneous Q24H  . furosemide  80 mg Intravenous BID  . hydrALAZINE  37.5 mg Oral TID  . lisinopril  40 mg Oral Daily  . metoprolol succinate  50 mg Oral QHS  . pneumococcal 23 valent  vaccine  0.5 mL Intramuscular Tomorrow-1000  . potassium chloride  20 mEq Oral BID  . sodium chloride  3 mL Intravenous Q12H  . spironolactone  25 mg Oral Daily   Continuous Infusions:  PRN Meds:.sodium chloride, acetaminophen, ondansetron (ZOFRAN) IV, sodium chloride Assessment/Plan: Principal Problem:   Acute on chronic combined systolic and diastolic CHF (congestive heart failure) Active Problems:   Sleep apnea   Morbid obesity   HTN (hypertension)  Acute on chronic combined systolic and diastolic CHF (EF 09-81%20-25% and Grade 2 DD): Exacerbation secondary to medical non-compliance and untreated hypertension and OSA. Weight is down to 444. He was out an additional 6.4 L yesterday for a total of 19.68 L since admission. His creatinine slightly increased overnight to 1.32 from 1.23. BUN remained stable at 17. Patient may receive an additional dose of metolazone today depending on Cardiology recommendations.  - Heart Failure Team following - Continue Lasix 80 mg IV BID  - Monitor I/O  - Daily weights  - Lisinopril 40 daily  - Increased Hydralazine 37.5 mg TID - Digoxin 0.25 mg daily - Metoprolol XL 50 mg QHS - Spironolactone 25 mg daily  Sleep apnea : Patient refused CPAP overnight because it caused him a headache. Patient also does not qualify for a home CPAP machine because his sleep study was over one year ago. Patient will likely need referral for repeat sleep study in order to obtain CPAP. I discussed the importance of the CPAP machine with the patient since his OSA was one of the contributing factors to his CHF.  -CPAP QHS -Referral to sleep study  HTN (hypertension) : Blood pressure 127/86-146/82. Patient recently had metoprolol increased and this will continue to be titrated up slowly as tolerated. Hydralazine increased to 37.5 mg TID today.   - Lisinopril 40mg  daily  - Hydralazine 37.5 mg TID - Digoxin 0.25 mg daily - Metoprolol XL 50 mg QHS - Spironolactone 25 mg  daily  DVT/PE ppx: Lovenox 40 mg daily  Dispo: Disposition is deferred at this time, awaiting improvement of current medical problems.  Anticipated discharge in approximately 1-2 day(s).   The patient does have a current PCP (Rich Numberarly Rivet, MD) and [facility].  The patient does not have transportation limitations that hinder transportation to clinic appointments.  .Services Needed at time  of discharge: Y = Yes, Blank = No PT:   OT:   RN:   Equipment:   Other:     LOS: 5 days   Jill Alexanders, DO PGY-1 Internal Medicine Resident Pager # [phone] 05/26/2014 10:56 AM

## 2014-05-26 NOTE — Progress Notes (Signed)
Pt has our CPAP setup at bedside. Pt states that he will put the CPAP on himself. I explained if he needed help to please call RT. Pt states he understands.

## 2014-05-26 NOTE — Care Management Note (Signed)
    Page 1 of 1   05/26/2014     11:57:41 AM CARE MANAGEMENT NOTE 05/26/2014  Patient:  Mark Baker, Mark Baker   Account Number:  [id number]  Date Initiated:  05/24/2014  Documentation initiated by:  Elmer Bales  Subjective/Objective Assessment:   Patient was admitted with Acute on chronic combined systolic and diastolic CHF.  Lives at home with spouse.     Action/Plan:   Will follow for discharge needs   Anticipated DC Date:     Anticipated DC Plan:  HOME W HOME HEALTH SERVICES      DC Planning Services  CM consult      Choice offered to / List presented to:             Status of service:  In process, will continue to follow Medicare Important Message given?   (If response is "NO", the following Medicare IM given date fields will be blank) Date Medicare IM given:   Medicare IM given by:   Date Additional Medicare IM given:   Additional Medicare IM given by:    Discharge Disposition:    Per UR Regulation:  Reviewed for med. necessity/level of care/duration of stay  If discussed at Long Length of Stay Meetings, dates discussed:   05/26/2014    Comments:  05/26/14 1015 Ricki Vanhandel J. Lucretia Roers, RN, BSN, [place] [phone] Pt had sleep study April of last year...was not able to afford CPAP and did not purchase.[facility] notified and will look into providing one for this pt. - Heart Failure Team following - Continue Lasix 80 mg IV BID

## 2014-05-26 NOTE — Progress Notes (Addendum)
CARDIAC REHAB PHASE I   PRE:  Rate/Rhythm: 95 SR  BP:  Supine:   Sitting: 136/87  Standing:    SaO2: 96%RA  MODE:  Ambulation: 800 ft   POST:  Rate/Rhythm: 106  BP:  Supine:   Sitting: 140/86  Standing:    SaO2: 90-92%RA 1055-1115 Pt walked 800 ft with steady gait. Denied SOB, no DOE noted. Pt wants to know if he can play in upcoming basketball tournament. I advised to concentrate on increasing walking until seen by Heart Failure Team as outpatient. Encouraged pt to discuss with Heart Failure Team on rounds.   Luetta Nutting, RN BSN  05/26/2014 11:11 AM

## 2014-05-26 NOTE — Progress Notes (Signed)
Advanced Heart Failure Rounding Note   Subjective:    33 y/o with morbid obesity, OSA, severe HTN admitted with ADHF and massive volume overload. EF 40% -> 20-25%  Increased lasix yesterday with a dose of metolazone. 24 hr I/O - 6.4 liters. Weight down 11 lbs. BMET pending. Denies SOB, orthopnea or CP. Renal stable. Toprol and hydralazine increased yesterday SBP 120-140s.   Objective:   Weight Range:  Vital Signs:   Temp:  [97.8 F (36.6 C)-98.2 F (36.8 C)] 98 F (36.7 C) (10/15 0615) Pulse Rate:  [84-99] 84 (10/15 0615) Resp:  [18] 18 (10/15 0615) BP: (127-155)/(82-103) 127/86 mmHg (10/15 0615) SpO2:  [92 %-98 %] 92 % (10/15 0615) Weight:  [444 lb (201.397 kg)] 444 lb (201.397 kg) (10/15 0615) Last BM Date: 05/25/14  Weight change: Filed Weights   05/24/14 0602 05/25/14 0513 05/26/14 0615  Weight: 457 lb 6.4 oz (207.475 kg) 455 lb 11.2 oz (206.704 kg) 444 lb (201.397 kg)    Intake/Output:   Intake/Output Summary (Last 24 hours) at 05/26/14 0812 Last data filed at 05/26/14 0615  Gross per 24 hour  Intake    683 ml  Output   7110 ml  Net  -6427 ml     General: Well appearing. No resp difficulty, sitting on side of bed  HEENT: normal  Neck: supple. JVP difficult to see d/t body habitus but appears elevated' . Carotids 2+ bilat; no bruits. No lymphadenopathy or thryomegaly appreciated.  Cor: PMI nondisplaced. Regular rate & rhythm. No rubs, gallops or murmurs.  Lungs: clear  Abdomen: Obese, soft, nontender, nondistended. No hepatosplenomegaly. No bruits or masses. Good bowel sounds.  Extremities: no cyanosis, clubbing, rash, 2+ bilateral edema  Neuro: alert & orientedx3, cranial nerves grossly intact. moves all 4 extremities w/o difficulty. Affect pleasant   Telemetry:  SR 80s  Labs: Basic Metabolic Panel:  Recent Labs Lab 05/21/14 1939 05/22/14 0015 05/22/14 0606 05/23/14 0355 05/24/14 0412 05/25/14 0311  NA 140  --  141 141 144 142  K 4.3  --  4.2 3.3*  3.9 3.9  CL 100  --  101 99 101 100  CO2 26  --  27 29 28 29   GLUCOSE 102*  --  100* 115* 94 95  BUN 17  --  16 16 19 17   CREATININE 1.49* 1.35 1.33 1.31 1.25 1.23  CALCIUM 9.2  --  9.4 9.2 9.0 9.3  MG  --  1.9  --   --   --   --     Liver Function Tests:  Recent Labs Lab 05/21/14 1939  AST 34  ALT 40  ALKPHOS 48  BILITOT 2.4*  PROT 7.2  ALBUMIN 3.8   No results found for this basename: LIPASE, AMYLASE,  in the last 168 hours No results found for this basename: AMMONIA,  in the last 168 hours  CBC:  Recent Labs Lab 05/21/14 1939 05/22/14 0015  WBC 8.1 7.5  HGB 14.2 14.7  HCT 43.8 45.2  MCV 86.1 87.9  PLT 266 256    Cardiac Enzymes:  Recent Labs Lab 05/22/14 0015 05/22/14 0606 05/22/14 1150  TROPONINI <0.30 <0.30 <0.30    BNP: BNP (last 3 results)  Recent Labs  05/21/14 1940  PROBNP 1544.0*     Other results:    Imaging: No results found.   Medications:     Scheduled Medications: . atorvastatin  40 mg Oral QPM  . digoxin  0.25 mg Oral Daily  . enoxaparin (LOVENOX)  injection  100 mg Subcutaneous Q24H  . furosemide  80 mg Intravenous BID  . hydrALAZINE  25 mg Oral TID  . lisinopril  40 mg Oral Daily  . metoprolol succinate  50 mg Oral QHS  . pneumococcal 23 valent vaccine  0.5 mL Intramuscular Tomorrow-1000  . potassium chloride  20 mEq Oral BID  . sodium chloride  3 mL Intravenous Q12H  . spironolactone  25 mg Oral Daily    Infusions:    PRN Medications: sodium chloride, acetaminophen, ondansetron (ZOFRAN) IV, sodium chloride   Assessment:   1) A/C systolic HF  - EF 20-25%, RV moderately down  2) Morbidly Obese  3) OSA, does not wear CPAP  4) HTN  5) NICM   Plan/Discussion:    Continues to diurese well, weight down 11 lbs overnight. Pending BMET. Will continue lasix IV 80 mg BID today. Depending on renal function may add another 5 mg metolazone.   SBP 120-140s. Will increase hydralazine to 37.5 mg TID. On goal dose  ACE-I. Continue Toprol 50 mg daily will titrate slowly patient had GI upset in the past.   Continue to ambulate with CR.  Hopefully home in the next couple days. He will need CPAP arranged as outpatient prior to d/c.  Length of Stay: 5 Aundria Rud NP-C 05/26/2014, 8:12 AM  Advanced Heart Failure Team Pager [phone] (M-F; 7a - 4p)  Please contact [facility] for night-coverage after hours (4p -7a ) and weekends on [email or web address]  Patient seen and examined with Ulla Potash, NP. We discussed all aspects of the encounter. I agree with the assessment and plan as stated above.   Continues to improve. Now down 40 pounds. Continue to diurese. Agree with increasing hydralazine.   Kirti Carl,MD 7:20 PM

## 2014-05-26 NOTE — Progress Notes (Signed)
Pt refused CPAP tonight. Stated last night he "got a really bad headache." So he wants to try off tonight to see if that was the cause of it. RN aware. RT will continue to monitor.

## 2014-05-26 NOTE — Discharge Summary (Signed)
Name: Mark Baker MRN: [id number] DOB: [birth year] 33 y.o. PCP: Rich Numberarly Rivet, MD  Date of Admission: 05/21/2014  8:44 PM Date of Discharge: 05/27/2014 Attending Physician: Inez CatalinaEmily B Mullen, MD  Discharge Diagnosis:  Principal Problem:   Acute on chronic combined systolic and diastolic CHF (congestive heart failure) Active Problems:   Sleep apnea   Morbid obesity   HTN (hypertension)  Discharge Medications:   Medication List    STOP taking these medications       amLODipine 5 MG tablet  Commonly known as:  NORVASC     carvedilol 12.5 MG tablet  Commonly known as:  COREG     hydrochlorothiazide 12.5 MG capsule  Commonly known as:  MICROZIDE     sildenafil 50 MG tablet  Commonly known as:  VIAGRA      TAKE these medications       atorvastatin 40 MG tablet  Commonly known as:  LIPITOR  Take 40 mg by mouth every evening.     digoxin 0.25 MG tablet  Commonly known as:  LANOXIN  Take 1 tablet (0.25 mg total) by mouth daily.     furosemide 40 MG tablet  Commonly known as:  LASIX  Take 1 tablet (40 mg total) by mouth 2 (two) times daily.     hydrALAZINE 25 MG tablet  Commonly known as:  APRESOLINE  Take 1 tablet (25 mg total) by mouth 3 (three) times daily.     lisinopril 40 MG tablet  Commonly known as:  PRINIVIL,ZESTRIL  Take 40 mg by mouth daily.     metoprolol succinate 50 MG 24 hr tablet  Commonly known as:  TOPROL-XL  Take 1 tablet (50 mg total) by mouth at bedtime. Take with or immediately following a meal.     potassium chloride SA 20 MEQ tablet  Commonly known as:  K-DUR,KLOR-CON  Take 1 tablet (20 mEq total) by mouth 2 (two) times daily.     spironolactone 25 MG tablet  Commonly known as:  ALDACTONE  Take 1 tablet (25 mg total) by mouth daily.        Disposition and follow-up:   [facility]  At the hospital follow up visit please address:  1.  CHF: Please assess weight,  lower extremity edema and shortness of breath. Please check compliance with medications.  OSA: Patient will need referral for sleep study to obtain CPAP.  2.  Labs / imaging needed at time of follow-up: BMET   3.  Pending labs/ test needing follow-up: None  Follow-up Appointments: Follow-up Information   Follow up with Aundria Rudosgrove, Ali B, NP On 06/03/2014. (at 0815 am in [facility]--Gate code 0500--Bring medications)    Specialty:  Nurse Practitioner   Contact information:   [address] [place] [address] [phone]       Follow up with [facility] On 06/08/2014. (at 1:45 pm with Dr. Delane GingerGill for hospital f/u)    Contact information:   [address] [place] [id number]       Discharge Instructions: Discharge Instructions   (HEART FAILURE PATIENTS) Call MD:  Anytime you have any of the following symptoms: 1) 3 pound weight gain in 24 hours or 5 pounds in 1 week 2) shortness of breath, with or without a dry hacking cough 3) swelling in the hands, feet or stomach 4) if you have to sleep on extra pillows at night in order to  breathe.    Complete by:  As directed      Amb Referral to Cardiac Rehabilitation    Complete by:  As directed      Call MD for:  difficulty breathing, headache or visual disturbances    Complete by:  As directed      Call MD for:  difficulty breathing, headache or visual disturbances    Complete by:  As directed            Consultations: Treatment Team:  Rounding Lbcardiology, MD  Procedures Performed:  Dg Chest 2 View  05/21/2014   CLINICAL DATA:  Shortness of breath and chest pain.  EXAM: CHEST  2 VIEW  COMPARISON:  None.  FINDINGS: The mediastinal contour is normal. The heart size is enlarged. There is pulmonary edema. There is no focal pneumonia or pleural effusion. No acute abnormalities identified within the visualized bones.  IMPRESSION: Congestive heart failure.   Electronically Signed   By: Sherian Rein M.D.   On: 05/21/2014 20:48   Admission HPI: Mark Baker is a 33 year old man with history of systolic and diastolic CHF (last echo 11/25/2012 LV EF 40-45%, grade 2 diastolic dysfunction on echo 02/2012), HTN, asthma, OSA not on CPAP presenting with increased fatigue and LE edema. He reports his LE edema has been increasing for the past couple weeks. He has also been having increased fatigue for the past 2-3 days. He reports he ran out of all of his medications 2 days ago. He stopped taking Coreg 1 year ago because it made him feel sick. He reports increased weight - 66 lbs over the past 4 months. He has worse fatigue with stairs and walking. He reports intermittent LUQ abdominal pain for the past 2 days that improves with drinking water and is worse with movement. Presently resolved. He has orthopnea at baseline. Denies fevers, chills, headache, lightheadedness, chest pain, shortness of breath, exertional chest pain/shortness of breath, PND, nausea, vomiting, diarrhea, dysuria, rash, paresthesias, weakness.   Hospital Course by problem list: Principal Problem:   Acute on chronic combined systolic and diastolic CHF (congestive heart failure) Active Problems:   Sleep apnea   Morbid obesity   HTN (hypertension)   Acute on chronic combined systolic and diastolic CHF (EF 95-62% and Grade 2 DD): Exacerbation secondary to medical non-compliance and untreated hypertension and OSA. Weight on admission was in the 480s and trended down to 439 with net 23.9L output since admission. Patient was followed by Heart Failure team who had him on Lasix 80 mg IV BID, Lisinopril 40 daily, Hydralazine 37.5 mg TID, Digoxin 0.25 mg daily, Metoprolol XL 50 mg QHS and Spironolactone 25 mg daily. He was also given a one time dose of metolazone 5 mg. Patient will follow up with Cardiology as outpatient. Patient was asymptomatic during stay, but did have 2-3+ pitting edema in lower extremities bilaterally extending to mid shin that  resolved on day of d/c. Pt d/c'd home with Lasix 40 mg BID, Hydralazine 25 mg TID, Toprol 50 mg at bedtime, Lisinopril 40 mg daily, Spironolactone 25 mg daily, dIgoxin 0.25 mg daily, Potassium 20 meq BID, and Atorvastatin 40 mg daily.   Sleep apnea : Patient was given CPAP during stay with non compliance 2/2 headaches. Patient does not qualify for a home CPAP machine because his sleep study was over one year ago. Patient will likely need referral for repeat sleep study in order to obtain CPAP. Discussed the importance of the CPAP machine with the patient  since his OSA was one of the contributing factors to his CHF.   HTN (hypertension): Blood pressure was not initially controlled, but was better controlled on Lisinopril 40mg  daily, Hydralazine 37.5 mg TID, Digoxin 0.25 mg daily, Metoprolol XL 50 mg QHS and Spironolactone 25 mg daily. Pt d/c'd home with the above meds noted under medications.   Discharge Vitals:   BP 114/85  Pulse 92  Temp(Src) 97.8 F (36.6 C) (Oral)  Resp 20  Ht 6\' 3"  (1.905 m)  Wt 439 lb 4.8 oz (199.265 kg)  BMI 54.91 kg/m2  SpO2 97%  Discharge Labs:  Results for orders placed during the hospital encounter of 05/21/14 (from the past 24 hour(s))  BASIC METABOLIC PANEL     Status: Abnormal   Collection Time    05/27/14  3:54 AM      Result Value Ref Range   Sodium 137  137 - 147 mEq/L   Potassium 3.9  3.7 - 5.3 mEq/L   Chloride 92 (*) 96 - 112 mEq/L   CO2 30  19 - 32 mEq/L   Glucose, Bld 88  70 - 99 mg/dL   BUN 24 (*) 6 - 23 mg/dL   Creatinine, Ser 7.61 (*) 0.50 - 1.35 mg/dL   Calcium 9.7  8.4 - 60.7 mg/dL   GFR calc non Af Amer 57 (*) >90 mL/min   GFR calc Af Amer 66 (*) >90 mL/min   Anion gap 15  5 - 15

## 2014-05-27 LAB — BASIC METABOLIC PANEL
Anion gap: 15 (ref 5–15)
BUN: 24 mg/dL — AB (ref 6–23)
CALCIUM: 9.7 mg/dL (ref 8.4–10.5)
CO2: 30 mEq/L (ref 19–32)
Chloride: 92 mEq/L — ABNORMAL LOW (ref 96–112)
Creatinine, Ser: 1.55 mg/dL — ABNORMAL HIGH (ref 0.50–1.35)
GFR calc Af Amer: 66 mL/min — ABNORMAL LOW (ref >90)
GFR, EST NON AFRICAN AMERICAN: 57 mL/min — AB (ref >90)
Glucose, Bld: 88 mg/dL (ref 70–99)
Potassium: 3.9 mEq/L (ref 3.7–5.3)
Sodium: 137 mEq/L (ref 137–147)

## 2014-05-27 MED ORDER — POTASSIUM CHLORIDE CRYS ER 20 MEQ PO TBCR: 20.0000 meq | EXTENDED_RELEASE_TABLET | Freq: Two times a day (BID) | ORAL | Status: DC

## 2014-05-27 MED ORDER — DIGOXIN 250 MCG PO TABS: 0.2500 mg | ORAL_TABLET | Freq: Every day | ORAL | Status: DC

## 2014-05-27 MED ORDER — HYDRALAZINE HCL 25 MG PO TABS
25.0000 mg | ORAL_TABLET | Freq: Three times a day (TID) | ORAL | Status: DC
  Administered 2014-05-27: 25 mg via ORAL
  Filled 2014-05-27 (×3): qty 1

## 2014-05-27 MED ORDER — METOPROLOL SUCCINATE ER 50 MG PO TB24: 50.0000 mg | ORAL_TABLET | Freq: Every day | ORAL | Status: DC

## 2014-05-27 MED ORDER — PNEUMOCOCCAL VAC POLYVALENT 25 MCG/0.5ML IJ INJ
0.5000 mL | INJECTION | Freq: Once | INTRAMUSCULAR | Status: DC
  Filled 2014-05-27: qty 0.5

## 2014-05-27 MED ORDER — HYDRALAZINE HCL 25 MG PO TABS: 25.0000 mg | ORAL_TABLET | Freq: Three times a day (TID) | ORAL | Status: DC

## 2014-05-27 MED ORDER — SPIRONOLACTONE 25 MG PO TABS: 25.0000 mg | ORAL_TABLET | Freq: Every day | ORAL | Status: DC

## 2014-05-27 MED ORDER — FUROSEMIDE 40 MG PO TABS: 40.0000 mg | ORAL_TABLET | Freq: Two times a day (BID) | ORAL | Status: DC

## 2014-05-27 NOTE — Progress Notes (Signed)
Discharge instructions completed. Meds reviewed.

## 2014-05-27 NOTE — Progress Notes (Signed)
  Date: 05/27/2014  Patient name: Mark Baker  Medical record number: [id number]  Date of birth: [birth year]   This patient has been seen and the plan of care was discussed with the house staff. Please see their note for complete details. I concur with their findings with the following additions/corrections:  [name] to be discharged today with close follow up with HF team in the outpatient setting.   Inez Catalina, MD 05/27/2014, 3:23 PM

## 2014-05-27 NOTE — Progress Notes (Signed)
Advanced Heart Failure Rounding Note   Subjective:    33 y/o with morbid obesity, OSA, severe HTN admitted with ADHF and massive volume overload. EF 40% -> 20-25%  Continued with IV lasix and metolazone yesterday. 24 hr I/O -4.2 liters and weight down 5 lbs. Rise in creatinine from 1.34 to 1.55. Denies SOB, orthopnea or CP.    Objective:   Weight Range:  Vital Signs:   Temp:  [97.8 F (36.6 C)-98 F (36.7 C)] 97.8 F (36.6 C) (10/16 0623) Pulse Rate:  [86-97] 86 (10/16 0623) Resp:  [18-20] 18 (10/16 0623) BP: (105-146)/(56-101) 105/56 mmHg (10/16 0623) SpO2:  [94 %-97 %] 94 % (10/16 0623) Weight:  [439 lb 4.8 oz (199.265 kg)] 439 lb 4.8 oz (199.265 kg) (10/16 0623) Last BM Date: 05/26/14  Weight change: Filed Weights   05/25/14 0513 05/26/14 0615 05/27/14 0623  Weight: 455 lb 11.2 oz (206.704 kg) 444 lb (201.397 kg) 439 lb 4.8 oz (199.265 kg)    Intake/Output:   Intake/Output Summary (Last 24 hours) at 05/27/14 0753 Last data filed at 05/27/14 0418  Gross per 24 hour  Intake    220 ml  Output   4440 ml  Net  -4220 ml     General: Well appearing. No resp difficulty, sitting in chair  HEENT: normal  Neck: supple. JVP difficult to see d/t body habitus but appears flat . Carotids 2+ bilat; no bruits. No lymphadenopathy or thryomegaly appreciated.  Cor: PMI nondisplaced. Regular rate & rhythm. No rubs, gallops or murmurs.  Lungs: clear  Abdomen: Obese, soft, nontender, nondistended. No hepatosplenomegaly. No bruits or masses. Good bowel sounds.  Extremities: no cyanosis, clubbing, rash, trace bilateral edema  Neuro: alert & orientedx3, cranial nerves grossly intact. moves all 4 extremities w/o difficulty. Affect pleasant   Telemetry:  SR 80s  Labs: Basic Metabolic Panel:  Recent Labs Lab 05/22/14 0015  05/24/14 0412 05/25/14 0311 05/26/14 0656 05/26/14 0910 05/27/14 0354  NA  --   < > 144 142 140 139 137  K  --   < > 3.9 3.9 3.9 4.2 3.9  CL  --   < > 101  100 96 96 92*  CO2  --   < > 28 29 30 31 30   GLUCOSE  --   < > 94 95 87 94 88  BUN  --   < > 19 17 17 17  24*  CREATININE 1.35  < > 1.25 1.23 1.32 1.34 1.55*  CALCIUM  --   < > 9.0 9.3 9.8 9.9 9.7  MG 1.9  --   --   --   --   --   --   < > = values in this interval not displayed.  Liver Function Tests:  Recent Labs Lab 05/21/14 1939  AST 34  ALT 40  ALKPHOS 48  BILITOT 2.4*  PROT 7.2  ALBUMIN 3.8   No results found for this basename: LIPASE, AMYLASE,  in the last 168 hours No results found for this basename: AMMONIA,  in the last 168 hours  CBC:  Recent Labs Lab 05/21/14 1939 05/22/14 0015  WBC 8.1 7.5  HGB 14.2 14.7  HCT 43.8 45.2  MCV 86.1 87.9  PLT 266 256    Cardiac Enzymes:  Recent Labs Lab 05/22/14 0015 05/22/14 0606 05/22/14 1150  TROPONINI <0.30 <0.30 <0.30    BNP: BNP (last 3 results)  Recent Labs  05/21/14 1940  PROBNP 1544.0*     Other results:  Imaging: No results found.   Medications:     Scheduled Medications: . atorvastatin  40 mg Oral QPM  . digoxin  0.25 mg Oral Daily  . enoxaparin (LOVENOX) injection  100 mg Subcutaneous Q24H  . furosemide  80 mg Intravenous BID  . hydrALAZINE  37.5 mg Oral TID  . lisinopril  40 mg Oral Daily  . metoprolol succinate  50 mg Oral QHS  . pneumococcal 23 valent vaccine  0.5 mL Intramuscular Tomorrow-1000  . potassium chloride  20 mEq Oral BID  . sodium chloride  3 mL Intravenous Q12H  . spironolactone  25 mg Oral Daily    Infusions:    PRN Medications: sodium chloride, acetaminophen, ondansetron (ZOFRAN) IV, sodium chloride   Assessment:   1) A/C systolic HF  - EF 20-25%, RV moderately down  2) Morbidly Obese  3) OSA, does not wear CPAP  4) HTN  5) NICM   Plan/Discussion:    Brisk diuresis again last night. Weight down another 5 lbs (total 44 lbs). Slight bump in renal function likely d/t he is now euvolemic. Will transition to lasix 40 mg BID for home.  SBP more  controlled 100-120s however having headaches. Will decrease hydralazine to 25 mg TID and will try to add Imdur as OP since he reports he is not using Viagra.  Will see as OP next week.   Home Meds: Lasix 40 mg BID Hydralazine 25 mg TID Toprol 50 mg at bedtime Lisinopril 40 mg daily Spironolactone 25 mg daily DIgoxin 0.25 mg daily Potassium 20 meq BID Atorvastatin 40 mg daily  Length of Stay: 6 Aundria Rud NP-C 05/27/2014, 7:53 AM  Advanced Heart Failure Team Pager [phone] (M-F; 7a - 4p)  Please contact [facility] for night-coverage after hours (4p -7a ) and weekends on [email or web address]  Patient seen and examined with Ulla Potash, NP. We discussed all aspects of the encounter. I agree with the assessment and plan as stated above.   Patient feels good this morning.  Will send him home with the above medication adjustments including bid Lasix.  He will be seen next week in clinic.   Marca Ancona 05/27/2014 8:08 AM

## 2014-05-27 NOTE — Discharge Instructions (Signed)
Please take your medications as instructed. Weigh yourself daily and contact your PCP if you notice any weight gain.

## 2014-05-27 NOTE — Progress Notes (Signed)
Subjective: Pt slept well last night and wore his CPAP. Since admission he has worn his CPAP twice. Denies difficulties affording medications at home and feels like his leg edema is back at his baseline. Denies chest pain, SOB, and orthopnea. He just moved into a new house and reports getting bug bites that he scratches.   Objective: Vital signs in last 24 hours: Filed Vitals:   05/26/14 1413 05/26/14 1727 05/26/14 2100 05/27/14 0623  BP: 146/92 127/67 122/75 105/56  Pulse: 97 92 87 86  Temp: 97.9 F (36.6 C)  98 F (36.7 C) 97.8 F (36.6 C)  TempSrc: Oral  Oral Oral  Resp: 20 18 18 18   Height:      Weight:    439 lb 4.8 oz (199.265 kg)  SpO2: 97%  97% 94%   Weight change: -4 lb 11.2 oz (-2.132 kg)  Intake/Output Summary (Last 24 hours) at 05/27/14 0718 Last data filed at 05/27/14 0418  Gross per 24 hour  Intake    220 ml  Output   4440 ml  Net  -4220 ml   General appearance: alert, morbidly obese and sitting on chair by bedside Lungs: clear to auscultation bilaterally and no wheezes Heart: regular rate and rhythm, S1, S2 normal, no murmur, click, rub or gallop Abdomen: distended, active bowel sounds, non tender to palpation Extremities: 1+ pitting edema in left leg, trace in rt. bleeding scab on left anterior shin Lab Results: Basic Metabolic Panel:  Recent Labs Lab 05/22/14 0015  05/26/14 0910 05/27/14 0354  NA  --   < > 139 137  K  --   < > 4.2 3.9  CL  --   < > 96 92*  CO2  --   < > 31 30  GLUCOSE  --   < > 94 88  BUN  --   < > 17 24*  CREATININE 1.35  < > 1.34 1.55*  CALCIUM  --   < > 9.9 9.7  MG 1.9  --   --   --   < > = values in this interval not displayed. Liver Function Tests:  Recent Labs Lab 05/21/14 1939  AST 34  ALT 40  ALKPHOS 48  BILITOT 2.4*  PROT 7.2  ALBUMIN 3.8   CBC:  Recent Labs Lab 05/21/14 1939 05/22/14 0015  WBC 8.1 7.5  HGB 14.2 14.7  HCT 43.8 45.2  MCV 86.1 87.9  PLT 266 256   Cardiac Enzymes:  Recent  Labs Lab 05/22/14 0015 05/22/14 0606 05/22/14 1150  TROPONINI <0.30 <0.30 <0.30   BNP:  Recent Labs Lab 05/21/14 1940  PROBNP 1544.0*   Hemoglobin A1C:  Recent Labs Lab 05/22/14 0606  HGBA1C 5.7*   Fasting Lipid Panel:  Recent Labs Lab 05/22/14 0606  CHOL 182  HDL 32*  LDLCALC 126*  TRIG 121  CHOLHDL 5.7   Thyroid Function Tests:  Recent Labs Lab 05/22/14 0015  TSH 0.535  Anemia Panel:  Recent Labs Lab 05/24/14 0412  FERRITIN 142   Medications: I have reviewed the patient's current medications. Scheduled Meds: . atorvastatin  40 mg Oral QPM  . digoxin  0.25 mg Oral Daily  . enoxaparin (LOVENOX) injection  100 mg Subcutaneous Q24H  . furosemide  80 mg Intravenous BID  . hydrALAZINE  37.5 mg Oral TID  . lisinopril  40 mg Oral Daily  . metoprolol succinate  50 mg Oral QHS  . pneumococcal 23 valent vaccine  0.5 mL Intramuscular Tomorrow-1000  .  potassium chloride  20 mEq Oral BID  . sodium chloride  3 mL Intravenous Q12H  . spironolactone  25 mg Oral Daily   Continuous Infusions:  PRN Meds:.sodium chloride, acetaminophen, ondansetron (ZOFRAN) IV, sodium chloride Assessment/Plan: Principal Problem:   Acute on chronic combined systolic and diastolic CHF (congestive heart failure) Active Problems:   Sleep apnea   Morbid obesity   HTN (hypertension)   Acute on chronic combined systolic and diastolic CHF (EF 21-30%20-25% on 10/11) 2/2 to medication non compliance. Currently 439lbs down from admission weight of 483lbs. Net -23.9 L since admission. Cr elevated at 1.55 from 1.34 yesterday, baseline around 1.10.  - Heart Failure Team following, pt to f/u with HF clinic next week.  - Lasix 80 mg IV BID transitioned to po 40mg  BID.   - Lisinopril 40 daily  - Hydralazine 37.5 mg TID decreased to 25mg  TID, pt instructed not to use viagra - Digoxin 0.25 mg daily  - Metoprolol XL 50 mg QHS  - Spironolactone 25 mg daily  - d/c home w/ Kdur 20mEq   Sleep apnea:   - CPAP QHS, non compliant during hospital course  - Referral to sleep study - [facility] looking in to assisting patient with CPAP cost, will also contact Lynnae JanuaryShana Grady for additional assistance with this.    HTN (hypertension) : pt did not tolerate home med coreg which was d/c'd on admission along w/ norvasc and pt started on metoprolol and hydralazine.   - Lisinopril 40mg  daily  - Hydralazine increased to 37.5 mg TID yesterday from 25mg  , likely why pt had a drop in BP of 105/56 overnight, d/c home w/ hydralazine 25mg  TID - Digoxin 0.25 mg daily  - Metoprolol XL 50 mg QHS  - Spironolactone 25 mg daily   DVT/PE ppx: Lovenox 40 mg daily   Dispo: d/c home today  The patient does have a current PCP (Rich Numberarly Rivet, MD) and [facility] after discharge.  The patient does not have transportation limitations that hinder transportation to clinic appointments.   .Services Needed at time of discharge: Y = Yes, Blank = No PT:   OT:   RN:   Equipment:   Other:     LOS: 6 days   Gara Kroneriana Raimi Guillermo, MD 05/27/2014, 7:18 AM

## 2014-06-02 NOTE — Progress Notes (Addendum)
Patient ID: Mark Baker, male   DOB: Jan 08, 1981, 33 y.o.   MRN: [id number] PCP: [facility] Primary Cardiologist: Dr. Elease Hashimoto  HPI:  Mark Baker is a 33 yo AA male with a history of morbid obesity, OSA, HTN, NICM and chronic combined systolic/diastolic HF.  Post Hospital Follow up for Heart Failure: Doing well. Has had minimal dizziness with hydralazine but getting better. Denies SOB, PND, orthopnea or LE edema. Weight at home 447. Following a low salt diet and drinking less than 2L a day. Taking medications as prescribed. Walking daily with no issues and able to go upstairs with no issues.    ROS: All systems negative except as listed in HPI, PMH and Problem List.  SH:  History   Social History  . Marital Status: Married    Spouse Name: N/A    Number of Children: N/A  . Years of Education: N/A   Occupational History  . Not on file.   Social History Main Topics  . Smoking status: Never Smoker   . Smokeless tobacco: Never Used  . Alcohol Use: Yes     Comment: seldom.  . Drug Use: No  . Sexual Activity: Not on file   Other Topics Concern  . Not on file   Social History Narrative   Lives in [place] with wife of 4 years.  Works for [employer] at [employer].  1 son and 2 daughters.     Denies cigarettes. Drank 1 beer last night 11/22/12. Denies drugs           FH:  Family History  Problem Relation Age of Onset  . Hypertension Father   . Heart failure Father     paternal side of family   . Hypertension Sister   . Stroke Maternal Grandmother   . Hypertension      maternal side of family     Past Medical History  Diagnosis Date  . Hypertension   . Asthma     childhood asthma no exacerbation since age 49   . OSA (obstructive sleep apnea)   . Chronic combined systolic and diastolic CHF (congestive heart failure)     a) ECHO (11/2012): EF 40-45%, RV fx difficult to see, nl size b) ECHO (05/2014): EF 20-25%, grade 2 DD, RV mildly dilated and sys fx mod reduced  . NICM  (nonischemic cardiomyopathy)     a) (11/2012): normal coronaries    Current Outpatient Prescriptions  Medication Sig Dispense Refill  . atorvastatin (LIPITOR) 40 MG tablet Take 40 mg by mouth every evening.      . digoxin (LANOXIN) 0.25 MG tablet Take 1 tablet (0.25 mg total) by mouth daily.  30 tablet  1  . furosemide (LASIX) 40 MG tablet Take 1 tablet (40 mg total) by mouth 2 (two) times daily.  60 tablet  3  . hydrALAZINE (APRESOLINE) 25 MG tablet Take 1 tablet (25 mg total) by mouth 3 (three) times daily.  150 tablet  1  . lisinopril (PRINIVIL,ZESTRIL) 40 MG tablet Take 40 mg by mouth daily.      . metoprolol succinate (TOPROL-XL) 50 MG 24 hr tablet Take 1 tablet (50 mg total) by mouth at bedtime. Take with or immediately following a meal.  30 tablet  3  . potassium chloride SA (K-DUR,KLOR-CON) 20 MEQ tablet Take 1 tablet (20 mEq total) by mouth 2 (two) times daily.  60 tablet  1  . spironolactone (ALDACTONE) 25 MG tablet Take 1 tablet (25 mg total) by  mouth daily.  30 tablet  3   No current facility-administered medications for this encounter.    Filed Vitals:   06/03/14 0838  BP: 119/76  Pulse: 88  Resp: 18  Weight: 443 lb 2 oz (201 kg)  SpO2: 96%    PHYSICAL EXAM: General:  Well appearing. No resp difficulty. Wife present HEENT: normal Neck: supple. JVP difficult to assess d/t body habitus but does not appear elevated; Carotids 2+ bilaterally; no bruits. No lymphadenopathy or thryomegaly appreciated. Cor: PMI normal. Distant heart sounds, Regular rate & rhythm. No rubs, gallops or murmurs. Lungs: clear Abdomen: soft, nontender, nondistended. No hepatosplenomegaly. No bruits or masses. Good bowel sounds. Extremities: obese, no cyanosis, clubbing, rash, trace bilateral edema Neuro: alert & orientedx3, cranial nerves grossly intact. Moves all 4 extremities w/o difficulty. Affect pleasant.   ASSESSMENT & PLAN:  1) Chronic combined systolic/diastolic HF: NICM, EF 20-25%,  grade II, RV sys fx mod reduced (05/2014) - Reviewed discharge summary and patient recently discharged from the hospital for A/C HF.  - NHYA II symptoms and volume status stable. Will continue lasix 40 mg BID.  - Continue Toprol 50 mg daily. - Will add Imdur 30 mg daily to hydralazine 25 mg TID. He was not previously on Imdur because it had on his chart he uses Viagra, however he confirms he does not use Viagra and knows not to take with Imdur.  - On goal dose lisinopril 40 mg daily.  - Continue current doses of Spironolactone and digoxin (last digoxin level 0.4, 05/2014) - Reinforced the need and importance of daily weights, a low sodium diet, and fluid restriction (less than 2 L a day). Instructed to call the HF clinic if weight increases more than 3 lbs overnight or 5 lbs in a week.  2) HTN - stable. Will continue current medications. As above will start Imdur.  3) Morbid Obesity - Discussed the need to lose a significant amount of weight. Talked about cutting back on portion sizes adn being more active.  4) OSA - is having trouble getting CPAP d/t very expensive. Filled out form for patient through [employer] to see if we can get him a CPAP. He will send form in with 100$ to see if he qualifies.   F/U 1 month Ulla Potashosgrove, Karsynn Deweese B NP-C 1:00 PM

## 2014-06-03 ENCOUNTER — Encounter (HOSPITAL_COMMUNITY): Payer: Self-pay

## 2014-06-03 ENCOUNTER — Ambulatory Visit (HOSPITAL_COMMUNITY)
Admit: 2014-06-03 | Discharge: 2014-06-03 | Disposition: A | Discharge status: Home / Self Care | Payer: 59 | Attending: Cardiology | Admitting: Cardiology

## 2014-06-03 VITALS — BP 119/76 | HR 88 | Resp 18 | Wt >= 6400 oz

## 2014-06-03 DIAGNOSIS — I10 Essential (primary) hypertension: Secondary | ICD-10-CM | POA: Insufficient documentation

## 2014-06-03 DIAGNOSIS — I429 Cardiomyopathy, unspecified: Secondary | ICD-10-CM | POA: Insufficient documentation

## 2014-06-03 DIAGNOSIS — G4730 Sleep apnea, unspecified: Secondary | ICD-10-CM

## 2014-06-03 DIAGNOSIS — Z79899 Other long term (current) drug therapy: Secondary | ICD-10-CM | POA: Insufficient documentation

## 2014-06-03 DIAGNOSIS — I5022 Chronic systolic (congestive) heart failure: Secondary | ICD-10-CM | POA: Diagnosis not present

## 2014-06-03 DIAGNOSIS — G4733 Obstructive sleep apnea (adult) (pediatric): Secondary | ICD-10-CM | POA: Insufficient documentation

## 2014-06-03 DIAGNOSIS — I5042 Chronic combined systolic (congestive) and diastolic (congestive) heart failure: Secondary | ICD-10-CM | POA: Diagnosis present

## 2014-06-03 LAB — BASIC METABOLIC PANEL
Anion gap: 12 (ref 5–15)
BUN: 21 mg/dL (ref 6–23)
CALCIUM: 9.3 mg/dL (ref 8.4–10.5)
CO2: 26 mEq/L (ref 19–32)
Chloride: 101 mEq/L (ref 96–112)
Creatinine, Ser: 1.34 mg/dL (ref 0.50–1.35)
GFR, EST AFRICAN AMERICAN: 79 mL/min — AB (ref >90)
GFR, EST NON AFRICAN AMERICAN: 68 mL/min — AB (ref >90)
GLUCOSE: 98 mg/dL (ref 70–99)
Potassium: 4.8 mEq/L (ref 3.7–5.3)
Sodium: 139 mEq/L (ref 137–147)

## 2014-06-03 MED ORDER — ISOSORBIDE MONONITRATE ER 30 MG PO TB24: 30.0000 mg | ORAL_TABLET | Freq: Every day | ORAL | Status: DC

## 2014-06-03 NOTE — Patient Instructions (Signed)
Doing great.  Start Isosorbide mononitrate 30 mg daily.   Call if headaches.  Follow up in 1 month  Do the following things EVERYDAY: 1) Weigh yourself in the morning before breakfast. Write it down and keep it in a log. 2) Take your medicines as prescribed 3) Eat low salt foods-Limit salt (sodium) to 2000 mg per day.  4) Stay as active as you can everyday 5) Limit all fluids for the day to less than 2 liters 6)

## 2014-06-03 NOTE — Addendum Note (Signed)
Encounter addended by: Shamia Uppal B Kimmora Risenhoover, NP on: 06/03/2014  2:32 PM<BR>     Documentation filed: Notes Section

## 2014-06-04 NOTE — Discharge Summary (Signed)
I saw Mark Baker on day of discharge and assisted with the discharge planning.

## 2014-06-08 ENCOUNTER — Ambulatory Visit: Payer: 59 | Admitting: Internal Medicine

## 2014-06-15 ENCOUNTER — Ambulatory Visit: Payer: 59 | Admitting: Internal Medicine

## 2014-06-15 ENCOUNTER — Encounter: Payer: Self-pay | Admitting: Internal Medicine

## 2014-07-01 ENCOUNTER — Encounter (HOSPITAL_COMMUNITY): Payer: 59

## 2014-07-21 ENCOUNTER — Encounter (HOSPITAL_COMMUNITY): Payer: Self-pay | Admitting: Cardiology

## 2014-09-06 ENCOUNTER — Other Ambulatory Visit (HOSPITAL_COMMUNITY): Payer: Self-pay | Admitting: Anesthesiology

## 2014-09-07 ENCOUNTER — Encounter (HOSPITAL_COMMUNITY): Payer: Self-pay | Admitting: Cardiology

## 2014-09-23 ENCOUNTER — Other Ambulatory Visit (HOSPITAL_COMMUNITY): Payer: Self-pay | Admitting: Anesthesiology

## 2014-10-13 ENCOUNTER — Encounter (HOSPITAL_COMMUNITY): Payer: Self-pay | Admitting: Cardiology

## 2014-10-13 ENCOUNTER — Other Ambulatory Visit (HOSPITAL_COMMUNITY): Payer: Self-pay | Admitting: Anesthesiology

## 2014-11-07 ENCOUNTER — Other Ambulatory Visit (HOSPITAL_COMMUNITY): Payer: Self-pay | Admitting: *Deleted

## 2014-11-07 MED ORDER — FUROSEMIDE 40 MG PO TABS: 40.0000 mg | ORAL_TABLET | Freq: Two times a day (BID) | ORAL | Status: DC

## 2014-11-11 ENCOUNTER — Other Ambulatory Visit (HOSPITAL_COMMUNITY): Payer: Self-pay | Admitting: *Deleted

## 2014-11-11 MED ORDER — SPIRONOLACTONE 25 MG PO TABS: 25.0000 mg | ORAL_TABLET | Freq: Every day | ORAL | Status: DC

## 2014-11-18 IMAGING — CR DG CHEST 1V PORT
1 series · 1 of 1 positions shown · non-contrast
Comparison: 10/21/2012 and 02/10/2012.

CLINICAL DATA: Stabbing chest pain today.  Shortness of breath with
nausea.

PORTABLE CHEST - 1 VIEW

[AP]
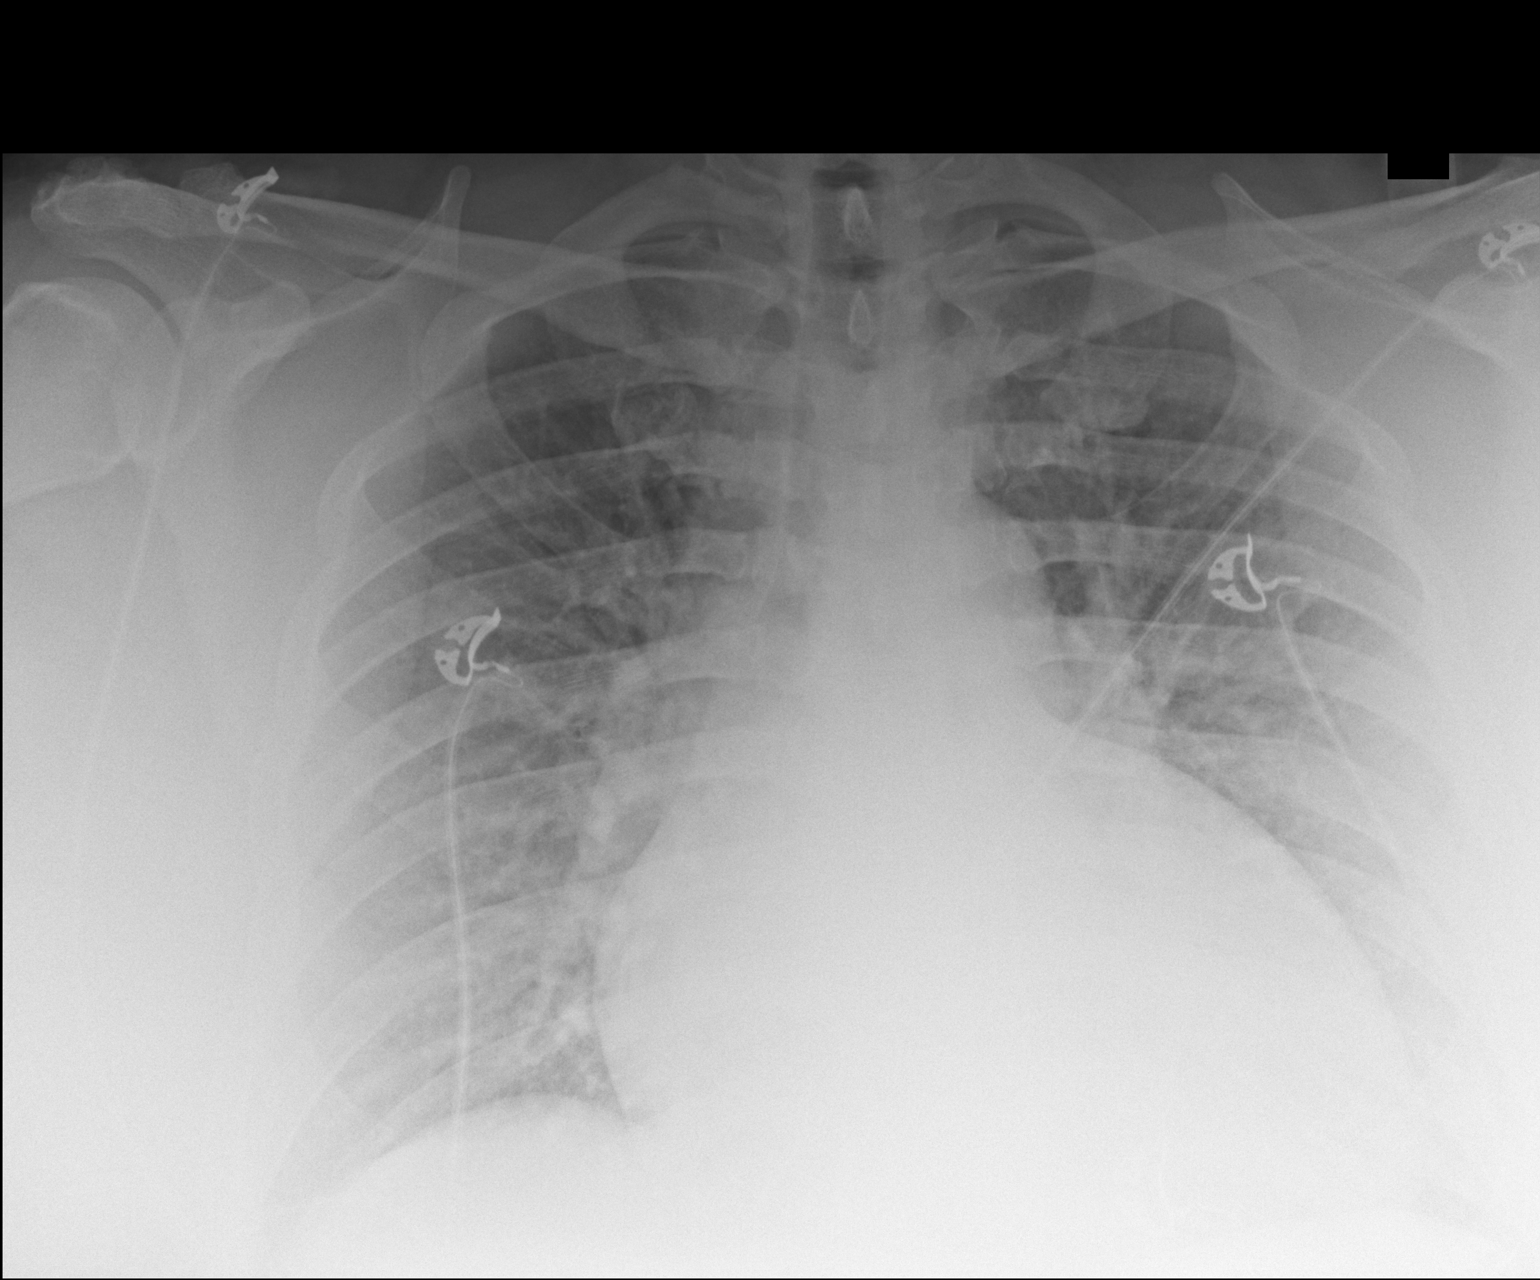

[1 of 1 positions shown; findings below may reference images not displayed]

FINDINGS: 0592 hours.  There are lower lung volumes.  Cardiomegaly
and vascular congestion are again noted.  There is no overt
pulmonary edema or confluent airspace opacity.  There is no
pneumothorax or significant pleural effusion.  Telemetry leads
overlie the chest.
IMPRESSION: Cardiomegaly and vascular congestion, similar to the prior
examination.  No new findings.

## 2014-11-30 ENCOUNTER — Other Ambulatory Visit (HOSPITAL_COMMUNITY): Payer: Self-pay | Admitting: Internal Medicine

## 2014-12-05 ENCOUNTER — Other Ambulatory Visit (HOSPITAL_COMMUNITY): Payer: Self-pay | Admitting: *Deleted

## 2014-12-05 MED ORDER — LISINOPRIL 40 MG PO TABS: 40.0000 mg | ORAL_TABLET | Freq: Every day | ORAL | Status: DC

## 2014-12-13 ENCOUNTER — Ambulatory Visit: Payer: Self-pay | Admitting: Internal Medicine

## 2014-12-15 ENCOUNTER — Telehealth: Payer: Self-pay | Admitting: Internal Medicine

## 2014-12-15 NOTE — Telephone Encounter (Signed)
Call to patient to confirm appointment for 12/16/14 at 10:15 lmtcb ° °

## 2014-12-16 ENCOUNTER — Ambulatory Visit: Payer: Self-pay | Admitting: Internal Medicine

## 2014-12-16 ENCOUNTER — Encounter: Payer: Self-pay | Admitting: Internal Medicine

## 2014-12-28 ENCOUNTER — Other Ambulatory Visit (HOSPITAL_COMMUNITY): Payer: Self-pay | Admitting: *Deleted

## 2014-12-28 MED ORDER — FUROSEMIDE 40 MG PO TABS: 40.0000 mg | ORAL_TABLET | Freq: Two times a day (BID) | ORAL | Status: DC

## 2015-01-16 ENCOUNTER — Encounter: Payer: Self-pay | Admitting: Internal Medicine

## 2015-01-16 ENCOUNTER — Ambulatory Visit (INDEPENDENT_AMBULATORY_CARE_PROVIDER_SITE_OTHER): Payer: BLUE CROSS/BLUE SHIELD | Admitting: Internal Medicine

## 2015-01-16 DIAGNOSIS — I5043 Acute on chronic combined systolic (congestive) and diastolic (congestive) heart failure: Secondary | ICD-10-CM

## 2015-01-16 DIAGNOSIS — Z6841 Body Mass Index (BMI) 40.0 and over, adult: Secondary | ICD-10-CM | POA: Diagnosis not present

## 2015-01-16 DIAGNOSIS — I10 Essential (primary) hypertension: Secondary | ICD-10-CM

## 2015-01-16 MED ORDER — LISINOPRIL 40 MG PO TABS: 40.0000 mg | ORAL_TABLET | Freq: Every day | ORAL | Status: DC

## 2015-01-16 MED ORDER — SPIRONOLACTONE 25 MG PO TABS: 25.0000 mg | ORAL_TABLET | Freq: Every day | ORAL | Status: DC

## 2015-01-16 MED ORDER — METOPROLOL SUCCINATE ER 50 MG PO TB24: 50.0000 mg | ORAL_TABLET | Freq: Every day | ORAL | Status: DC

## 2015-01-16 MED ORDER — DIGOXIN 250 MCG PO TABS: 250.0000 ug | ORAL_TABLET | Freq: Every day | ORAL | Status: DC

## 2015-01-16 MED ORDER — FUROSEMIDE 40 MG PO TABS: 40.0000 mg | ORAL_TABLET | Freq: Two times a day (BID) | ORAL | Status: DC

## 2015-01-16 MED ORDER — HYDRALAZINE HCL 25 MG PO TABS: 25.0000 mg | ORAL_TABLET | Freq: Three times a day (TID) | ORAL | Status: DC

## 2015-01-16 NOTE — Assessment & Plan Note (Signed)
BP Readings from Last 3 Encounters:  01/16/15 167/116  05/27/14 114/85  12/09/12 148/74    Lab Results  Component Value Date   NA 139 06/03/2014   K 4.8 06/03/2014   CREATININE 1.34 06/03/2014    Assessment: Comments: BP quite high today, 167/116. Has not been taking any of his BP medications for quite some time.  Plan: Medications:  Restart home BP medications;  Toprol-XL 50 mg daily, Hydralazine 25 mg tid, Lisinopril 40 mg daily, Spironolactone 25 mg daily, and Lasix 80 mg bid (Lasix dose to be reassessed at next clinic visit). Educational resources provided: brochure (denies) Self management tools provided:   Other plans: Check BMP today, recheck at next visit.

## 2015-01-16 NOTE — Progress Notes (Signed)
[facility] Attending  Case discussed with Dr. Jones at the time of the visit.  We reviewed the resident's history and exam and pertinent patient test results.  I agree with the assessment, diagnosis, and plan of care documented in the resident's note.  

## 2015-01-16 NOTE — Progress Notes (Signed)
Subjective:   Patient ID: Mark Baker male   DOB: 25-Aug-1980 34 y.o.   MRN: [id number]  HPI: [name] is a 34 y.o. male w/ PMHx of HTN, Asthma, OSA, morbid obesity, and chronic combined CHF (EF 20%, grade 2 diastolic dysfunction), presents to the clinic today for a follow-up visit regarding his CHF and BP. Today, the patient is complaining of worsening LE edema and DOE. He states he has been out of several of his medications for several days, some even weeks. He states he was unable to get refills until he was seen in the clinic. Patient typically takes Lasix 40 mg bid but says he has missed some doses recently and only took 40 mg once yesterday. He has not seen a doctor since 06/2014. He does admit to having worsened DOE, increased weight, and LE swelling for quite some time. He also states that the 40 mg bid of Lasix was previously working for him and now is not as effective as it once was. Most recent weight was 443 lbs in 05/2014, now his weight is 484 lbs. Patient does admit to 3-4 pillow orthopnea, but denies PND, chest pain, dizziness, lightheadedness, and palpitations.   Past Medical History  Diagnosis Date  . Hypertension   . Asthma     childhood asthma no exacerbation since age 77   . OSA (obstructive sleep apnea)   . Chronic combined systolic and diastolic CHF (congestive heart failure)     a) ECHO (11/2012): EF 40-45%, RV fx difficult to see, nl size b) ECHO (05/2014): EF 20-25%, grade 2 DD, RV mildly dilated and sys fx mod reduced  . NICM (nonischemic cardiomyopathy)     a) (11/2012): normal coronaries   Current Outpatient Prescriptions  Medication Sig Dispense Refill  . atorvastatin (LIPITOR) 40 MG tablet TAKE 1 TABLET BY MOUTH DAILY 34 tablet PRN  . digoxin (LANOXIN) 0.25 MG tablet TAKE 1 TABLET BY MOUTH DAILY 34 tablet PRN  . furosemide (LASIX) 40 MG tablet Take 1 tablet (40 mg total) by mouth 2 (two) times daily. 60 tablet 1  . hydrALAZINE (APRESOLINE) 25 MG tablet  Take 1 tablet (25 mg total) by mouth 3 (three) times daily. 150 tablet 1  . isosorbide mononitrate (IMDUR) 30 MG 24 hr tablet Take 1 tablet (30 mg total) by mouth daily. 30 tablet 3  . lisinopril (PRINIVIL,ZESTRIL) 40 MG tablet Take 1 tablet (40 mg total) by mouth daily. 34 tablet 1  . metoprolol succinate (TOPROL-XL) 50 MG 24 hr tablet Take 1 tablet (50 mg total) by mouth at bedtime. Take with or immediately following a meal. 30 tablet 3  . potassium chloride SA (K-DUR,KLOR-CON) 20 MEQ tablet Take 1 tablet (20 mEq total) by mouth 2 (two) times daily. 60 tablet 1  . spironolactone (ALDACTONE) 25 MG tablet Take 1 tablet (25 mg total) by mouth daily. NEEDS OFFICE VISIT 30 tablet 0   No current facility-administered medications for this visit.   Review of Systems  General: Denies fever, diaphoresis, appetite change, and fatigue.  Respiratory: Positive for DOE. Denies cough, and wheezing.   Cardiovascular: Positive for orthopnea and LE swelling. Denies chest pain and palpitations.  Gastrointestinal: Denies nausea, vomiting, abdominal pain, and diarrhea Musculoskeletal: Denies myalgias, arthralgias, back pain, and gait problem.  Neurological: Denies dizziness, syncope, weakness, lightheadedness, and headaches.  Psychiatric/Behavioral: Denies mood changes, sleep disturbance, and agitation.   Objective:   Physical Exam: Filed Vitals:   01/16/15 0941  BP: 167/116  Pulse: 104  Temp: 98.2 F (36.8 C)  TempSrc: Oral  Height: 6' 2.5" (1.892 m)  Weight: 484 lb 14.4 oz (219.949 kg) (443 lbs in 05/2014)  SpO2: 97%    General: Morbidly obese AA male, alert, cooperative, NAD. Mildly tachypneic.  HEENT: PERRL, EOMI. Moist mucus membranes Neck: Thick neck, full range of motion without pain, supple, no lymphadenopathy or carotid bruits. Could not appreciated JVD.  Lungs: Distant breath sounds, clear to ascultation bilaterally, normal work of respiration, no wheezes, rales, rhonchi Heart: Distant  heart sounds, tachycardic, no murmurs, gallops, or rubs Abdomen: Soft, obese, non-tender, non-distended, BS + Extremities: No cyanosis, clubbing. Pitting (+3) + non-pitting edema extending up to the upper thigh. No sacral or abdominal edema on exam.  Neurologic: Alert & oriented x3, cranial nerves II-XII intact, strength grossly intact, sensation intact to light touch   Assessment & Plan:   Please see problem based assessment and plan.

## 2015-01-16 NOTE — Assessment & Plan Note (Addendum)
Patient presents today w/ worsening LE edema, increased DOE, and weight increase. Most recent weight from previous admission in 05/2014 was 443 lbs, now increased to 484 lbs. Patient states he has not taken most of his BP medications in quite some time and he only took one dose of Lasix 40 mg po yesterday. He also feels like he may have missed doses over the past month or so. He admits to orthopnea but denies PND. No chest pain, palpitations, dizziness, or lightheadedness. Today, he does not appear in distress but does have some mild SOB on exam, is tachycardic to 104. Patient is morbidly obese so heart and lung sounds were quite difficult to auscultate, however, lungs did sound clear. Patient also w/ thick neck, JVD was not appreciated (although most likely present). LE edema is significant, +3, extending into his thighs bilaterally. Patient was previously taking Lasix 40 mg bid, Toprol-XL 50 mg daily, Hydralazine 25 mg tid, Lisinopril 40 mg daily, Spironolactone 25 mg daily, and Digoxin 0.250 mg daily. He states he cannot remember the last time he took his BB, took Lisinopril 3 weeks ago, Hydralazine 5-6 days ago, Digoxin 2-3 days ago, and Spironolactone ha states he cannot remember the last time he took this. BP 167/116 today.  -Restart Lasix; Patient advised to take 80 mg po bid for now (typically takes 40 mg bid); will return on Wednesday to recheck volume status and BMP -Check BMP today -Restart BP medications as above; Toprol-XL 50 mg daily, Hydralazine 25 mg tid, Lisinopril 40 mg daily, Spironolactone 25 mg daily.  -Restart Digoxin 0.250 mg daily -Advised patient if symptoms get worse (SOB, LE edema) to come to the ED or call the clinic sooner -Advises patient to schedule follow up w/ CHF clinic -Patient states he was supposed to be started on Imdur (noted in [place] note in 06/2014), however, he never picked up this Rx. Will likely need to be started on this medication as well in the near future  pending tolerance w/ other medications

## 2015-01-16 NOTE — Patient Instructions (Signed)
1. Please make a follow up appointment for Wednesday.  2. Please take all medications as previously prescribed with the following changes:  Restart the following medications:  Digoxin 0.250 mg ONCE DAILY  Toprol-XL 50 mg ONCE DAILY  Lisinopril 40 mg ONCE DAILY  Hydralazine 25 mg THREE TIMES DAILY  Spironolactone 25 mg ONCE DAILY  TAKE 80 mg TWICE DAILY OF LASIX UNTIL YOUR NEXT CLINIC VISIT (TAKE 2 DOSES TODAY).  PLEASE CALL THE HEART FAILURE CLINIC AND MAKE A FOLLOW UP APPOINTMENT AS WELL.   3. If you have worsening of your symptoms or new symptoms arise, please call the clinic ([phone]), or go to the ER immediately if symptoms are severe.    Heart Failure Heart failure is a condition in which the heart has trouble pumping blood. This means your heart does not pump blood efficiently for your body to work well. In some cases of heart failure, fluid may back up into your lungs or you may have swelling (edema) in your lower legs. Heart failure is usually a long-term (chronic) condition. It is important for you to take good care of yourself and follow your health care provider's treatment plan. CAUSES  Some health conditions can cause heart failure. Those health conditions include:  High blood pressure (hypertension). Hypertension causes the heart muscle to work harder than normal. When pressure in the blood vessels is high, the heart needs to pump (contract) with more force in order to circulate blood throughout the body. High blood pressure eventually causes the heart to become stiff and weak.  Coronary artery disease (CAD). CAD is the buildup of cholesterol and fat (plaque) in the arteries of the heart. The blockage in the arteries deprives the heart muscle of oxygen and blood. This can cause chest pain and may lead to a heart attack. High blood pressure can also contribute to CAD.  Heart attack (myocardial infarction). A heart attack occurs when one or more arteries in the heart  become blocked. The loss of oxygen damages the muscle tissue of the heart. When this happens, part of the heart muscle dies. The injured tissue does not contract as well and weakens the heart's ability to pump blood.  Abnormal heart valves. When the heart valves do not open and close properly, it can cause heart failure. This makes the heart muscle pump harder to keep the blood flowing.  Heart muscle disease (cardiomyopathy or myocarditis). Heart muscle disease is damage to the heart muscle from a variety of causes. These can include drug or alcohol abuse, infections, or unknown reasons. These can increase the risk of heart failure.  Lung disease. Lung disease makes the heart work harder because the lungs do not work properly. This can cause a strain on the heart, leading it to fail.  Diabetes. Diabetes increases the risk of heart failure. High blood sugar contributes to high fat (lipid) levels in the blood. Diabetes can also cause slow damage to tiny blood vessels that carry important nutrients to the heart muscle. When the heart does not get enough oxygen and food, it can cause the heart to become weak and stiff. This leads to a heart that does not contract efficiently.  Other conditions can contribute to heart failure. These include abnormal heart rhythms, thyroid problems, and low blood counts (anemia). Certain unhealthy behaviors can increase the risk of heart failure, including:  Being overweight.  Smoking or chewing tobacco.  Eating foods high in fat and cholesterol.  Abusing illicit drugs or alcohol.  Lacking  physical activity. SYMPTOMS  Heart failure symptoms may vary and can be hard to detect. Symptoms may include:  Shortness of breath with activity, such as climbing stairs.  Persistent cough.  Swelling of the feet, ankles, legs, or abdomen.  Unexplained weight gain.  Difficulty breathing when lying flat (orthopnea).  Waking from sleep because of the need to sit up and  get more air.  Rapid heartbeat.  Fatigue and loss of energy.  Feeling light-headed, dizzy, or close to fainting.  Loss of appetite.  Nausea.  Increased urination during the night (nocturia). DIAGNOSIS  A diagnosis of heart failure is based on your history, symptoms, physical examination, and diagnostic tests. Diagnostic tests for heart failure may include:  Echocardiography.  Electrocardiography.  Chest X-ray.  Blood tests.  Exercise stress test.  Cardiac angiography.  Radionuclide scans. TREATMENT  Treatment is aimed at managing the symptoms of heart failure. Medicines, behavioral changes, or surgical intervention may be necessary to treat heart failure.  Medicines to help treat heart failure may include:  Angiotensin-converting enzyme (ACE) inhibitors. This type of medicine blocks the effects of a blood protein called angiotensin-converting enzyme. ACE inhibitors relax (dilate) the blood vessels and help lower blood pressure.  Angiotensin receptor blockers (ARBs). This type of medicine blocks the actions of a blood protein called angiotensin. Angiotensin receptor blockers dilate the blood vessels and help lower blood pressure.  Water pills (diuretics). Diuretics cause the kidneys to remove salt and water from the blood. The extra fluid is removed through urination. This loss of extra fluid lowers the volume of blood the heart pumps.  Beta blockers. These prevent the heart from beating too fast and improve heart muscle strength.  Digitalis. This increases the force of the heartbeat.  Healthy behavior changes include:  Obtaining and maintaining a healthy weight.  Stopping smoking or chewing tobacco.  Eating heart-healthy foods.  Limiting or avoiding alcohol.  Stopping illicit drug use.  Physical activity as directed by your health care provider.  Surgical treatment for heart failure may include:  A procedure to open blocked arteries, repair damaged heart  valves, or remove damaged heart muscle tissue.  A pacemaker to improve heart muscle function and control certain abnormal heart rhythms.  An internal cardioverter defibrillator to treat certain serious abnormal heart rhythms.  A left ventricular assist device (LVAD) to assist the pumping ability of the heart. HOME CARE INSTRUCTIONS   Take medicines only as directed by your health care provider. Medicines are important in reducing the workload of your heart, slowing the progression of heart failure, and improving your symptoms.  Do not stop taking your medicine unless directed by your health care provider.  Do not skip any dose of medicine.  Refill your prescriptions before you run out of medicine. Your medicines are needed every day.  Engage in moderate physical activity if directed by your health care provider. Moderate physical activity can benefit some people. The elderly and people with severe heart failure should consult with a health care provider for physical activity recommendations.  Eat heart-healthy foods. Food choices should be free of trans fat and low in saturated fat, cholesterol, and salt (sodium). Healthy choices include fresh or frozen fruits and vegetables, fish, lean meats, legumes, fat-free or low-fat dairy products, and whole grain or high fiber foods. Talk to a dietitian to learn more about heart-healthy foods.  Limit sodium if directed by your health care provider. Sodium restriction may reduce symptoms of heart failure in some people. Talk to a dietitian  to learn more about heart-healthy seasonings.  Use healthy cooking methods. Healthy cooking methods include roasting, grilling, broiling, baking, poaching, steaming, or stir-frying. Talk to a dietitian to learn more about healthy cooking methods.  Limit fluids if directed by your health care provider. Fluid restriction may reduce symptoms of heart failure in some people.  Weigh yourself every day. Daily weights are  important in the early recognition of excess fluid. You should weigh yourself every morning after you urinate and before you eat breakfast. Wear the same amount of clothing each time you weigh yourself. Record your daily weight. Provide your health care provider with your weight record.  Monitor and record your blood pressure if directed by your health care provider.  Check your pulse if directed by your health care provider.  Lose weight if directed by your health care provider. Weight loss may reduce symptoms of heart failure in some people.  Stop smoking or chewing tobacco. Nicotine makes your heart work harder by causing your blood vessels to constrict. Do not use nicotine gum or patches before talking to your health care provider.  Keep all follow-up visits as directed by your health care provider. This is important.  Limit alcohol intake to no more than 1 drink per day for nonpregnant women and 2 drinks per day for men. One drink equals 12 ounces of beer, 5 ounces of wine, or 1 ounces of hard liquor. Drinking more than that is harmful to your heart. Tell your health care provider if you drink alcohol several times a week. Talk with your health care provider about whether alcohol is safe for you. If your heart has already been damaged by alcohol or you have severe heart failure, drinking alcohol should be stopped completely.  Stop illicit drug use.  Stay up-to-date with immunizations. It is especially important to prevent respiratory infections through current pneumococcal and influenza immunizations.  Manage other health conditions such as hypertension, diabetes, thyroid disease, or abnormal heart rhythms as directed by your health care provider.  Learn to manage stress.  Plan rest periods when fatigued.  Learn strategies to manage high temperatures. If the weather is extremely hot:  Avoid vigorous physical activity.  Use air conditioning or fans or seek a cooler location.  Avoid  caffeine and alcohol.  Wear loose-fitting, lightweight, and light-colored clothing.  Learn strategies to manage cold temperatures. If the weather is extremely cold:  Avoid vigorous physical activity.  Layer clothes.  Wear mittens or gloves, a hat, and a scarf when going outside.  Avoid alcohol.  Obtain ongoing education and support as needed.  Participate in or seek rehabilitation as needed to maintain or improve independence and quality of life. SEEK MEDICAL CARE IF:   Your weight increases by 03 lb/1.4 kg in 1 day or 05 lb/2.3 kg in a week.  You have increasing shortness of breath that is unusual for you.  You are unable to participate in your usual physical activities.  You tire easily.  You cough more than normal, especially with physical activity.  You have any or more swelling in areas such as your hands, feet, ankles, or abdomen.  You are unable to sleep because it is hard to breathe.  You feel like your heart is beating fast (palpitations).  You become dizzy or light-headed upon standing up. SEEK IMMEDIATE MEDICAL CARE IF:   You have difficulty breathing.  There is a change in mental status such as decreased alertness or difficulty with concentration.  You have a pain  or discomfort in your chest.  You have an episode of fainting (syncope). MAKE SURE YOU:   Understand these instructions.  Will watch your condition.  Will get help right away if you are not doing well or get worse. Document Released: 07/29/2005 Document Revised: 12/13/2013 Document Reviewed: 08/28/2012 [facility] Patient Information 2015 [place], [place]. This information is not intended to replace advice given to you by your health care provider. Make sure you discuss any questions you have with your health care provider.

## 2015-01-16 NOTE — Assessment & Plan Note (Signed)
Weight increased from 443 to 484 lbs today. Feel that most of this weight increase is related to volume, however, some is also likely related to baseline obesity, poor dietary habits, and lack of exercise, now worsened by ongoing DOE.  -Lasix as discussed in CHF section -Advised healthy eating habits, exercise

## 2015-01-17 LAB — BASIC METABOLIC PANEL WITH GFR
BUN: 20 mg/dL (ref 6–23)
CALCIUM: 9.4 mg/dL (ref 8.4–10.5)
CHLORIDE: 104 meq/L (ref 96–112)
CO2: 26 meq/L (ref 19–32)
CREATININE: 1.44 mg/dL — AB (ref 0.50–1.35)
GFR, EST NON AFRICAN AMERICAN: 63 mL/min
GFR, Est African American: 73 mL/min
Glucose, Bld: 100 mg/dL — ABNORMAL HIGH (ref 70–99)
POTASSIUM: 4 meq/L (ref 3.5–5.3)
Sodium: 141 mEq/L (ref 135–145)

## 2015-01-18 ENCOUNTER — Ambulatory Visit (INDEPENDENT_AMBULATORY_CARE_PROVIDER_SITE_OTHER): Payer: BLUE CROSS/BLUE SHIELD | Admitting: Internal Medicine

## 2015-01-18 ENCOUNTER — Encounter: Payer: Self-pay | Admitting: Internal Medicine

## 2015-01-18 VITALS — BP 144/102 | HR 93 | Temp 98.2°F | Ht 74.5 in | Wt >= 6400 oz

## 2015-01-18 DIAGNOSIS — I10 Essential (primary) hypertension: Secondary | ICD-10-CM

## 2015-01-18 DIAGNOSIS — I5043 Acute on chronic combined systolic (congestive) and diastolic (congestive) heart failure: Secondary | ICD-10-CM | POA: Diagnosis not present

## 2015-01-18 DIAGNOSIS — E785 Hyperlipidemia, unspecified: Secondary | ICD-10-CM

## 2015-01-18 DIAGNOSIS — Z79899 Other long term (current) drug therapy: Secondary | ICD-10-CM

## 2015-01-18 LAB — BASIC METABOLIC PANEL WITH GFR
BUN: 16 mg/dL (ref 6–23)
CO2: 26 mEq/L (ref 19–32)
Calcium: 9 mg/dL (ref 8.4–10.5)
Chloride: 102 mEq/L (ref 96–112)
Creat: 1.45 mg/dL — ABNORMAL HIGH (ref 0.50–1.35)
GFR, Est African American: 72 mL/min
GFR, Est Non African American: 62 mL/min
Glucose, Bld: 91 mg/dL (ref 70–99)
Potassium: 3.9 mEq/L (ref 3.5–5.3)
Sodium: 141 mEq/L (ref 135–145)

## 2015-01-18 MED ORDER — ISOSORBIDE MONONITRATE ER 30 MG PO TB24: 30.0000 mg | ORAL_TABLET | Freq: Every day | ORAL | Status: DC

## 2015-01-18 MED ORDER — ATORVASTATIN CALCIUM 40 MG PO TABS: 40.0000 mg | ORAL_TABLET | Freq: Every day | ORAL | Status: DC

## 2015-01-18 NOTE — Progress Notes (Signed)
[facility] Attending  Case discussed with Dr. Jones at the time of the visit.  We reviewed the resident's history and exam and pertinent patient test results.  I agree with the assessment, diagnosis, and plan of care documented in the resident's note.  

## 2015-01-18 NOTE — Progress Notes (Signed)
Subjective:   Patient ID: Mark Baker male   DOB: 11-23-1980 34 y.o.   MRN: [id number]  HPI: Mark Baker is a 34 y.o. male w/ PMHx of HTN, Asthma, OSA, morbid obesity, and chronic combined CHF (EF 20%, grade 2 diastolic dysfunction), presents to the clinic today for a follow-up visit regarding his CHF and BP. Patient was last seen on 01/16/15 at which time he had been out of most of his medications for quite some time and had symptoms of early CHF exacerbation. At that time he was sent home on double the dose of his Lasix, 80 mg bid, and restarted his Spironolactone, Lisinopril, Hydralazine, Toprol-XL, and Digoxin. Today, the patient says he feels much better, SOB improved, LE edema decreased. His weight is 474 lbs, decreased form 484 two days ago. Patient states he has had some mild dizziness associated w/ resuming his Hydralazine, but he says it is only mild in nature. No cough, chest pain, palpitations, lightheadedness, or PND.   Past Medical History  Diagnosis Date  . Hypertension   . Asthma     childhood asthma no exacerbation since age 48   . OSA (obstructive sleep apnea)   . Chronic combined systolic and diastolic CHF (congestive heart failure)     a) ECHO (11/2012): EF 40-45%, RV fx difficult to see, nl size b) ECHO (05/2014): EF 20-25%, grade 2 DD, RV mildly dilated and sys fx mod reduced  . NICM (nonischemic cardiomyopathy)     a) (11/2012): normal coronaries   Current Outpatient Prescriptions  Medication Sig Dispense Refill  . atorvastatin (LIPITOR) 40 MG tablet TAKE 1 TABLET BY MOUTH DAILY 34 tablet PRN  . digoxin (LANOXIN) 0.25 MG tablet Take 1 tablet (250 mcg total) by mouth daily. 30 tablet 2  . furosemide (LASIX) 40 MG tablet Take 1 tablet (40 mg total) by mouth 2 (two) times daily. 60 tablet 2  . hydrALAZINE (APRESOLINE) 25 MG tablet Take 1 tablet (25 mg total) by mouth 3 (three) times daily. 90 tablet 5  . isosorbide mononitrate (IMDUR) 30 MG 24 hr tablet Take 1 tablet  (30 mg total) by mouth daily. 30 tablet 3  . lisinopril (PRINIVIL,ZESTRIL) 40 MG tablet Take 1 tablet (40 mg total) by mouth daily. 30 tablet 2  . metoprolol succinate (TOPROL-XL) 50 MG 24 hr tablet Take 1 tablet (50 mg total) by mouth at bedtime. Take with or immediately following a meal. 30 tablet 2  . potassium chloride SA (K-DUR,KLOR-CON) 20 MEQ tablet Take 1 tablet (20 mEq total) by mouth 2 (two) times daily. 60 tablet 1  . spironolactone (ALDACTONE) 25 MG tablet Take 1 tablet (25 mg total) by mouth daily. 30 tablet 2   No current facility-administered medications for this visit.   Review of Systems  General: Denies fever, diaphoresis, appetite change, and fatigue.  Respiratory: Positive for mild DOE. Denies cough, and wheezing.   Cardiovascular: Positive for orthopnea and LE swelling (improved). Denies chest pain and palpitations.  Gastrointestinal: Denies nausea, vomiting, abdominal pain, and diarrhea Musculoskeletal: Denies myalgias, arthralgias, back pain, and gait problem.  Neurological: Denies dizziness, syncope, weakness, lightheadedness, and headaches.  Psychiatric/Behavioral: Denies mood changes, sleep disturbance, and agitation.    Objective:   Today's Vitals   01/18/15 1010  BP: 144/102  Pulse: 93  Temp: 98.2 F (36.8 C)  TempSrc: Oral  Height: 6' 2.5" (1.892 m)  Weight: 474 lb 4.8 oz (215.141 kg)  SpO2: 98%   Physical Exam: General: Morbidly obese AA  male, alert, cooperative, NAD.  HEENT: PERRL, EOMI. Moist mucus membranes Neck: Thick neck, full range of motion without pain, supple, no lymphadenopathy or carotid bruits. Could not appreciated JVD.  Lungs: Distant breath sounds, clear to ascultation bilaterally, normal work of respiration, no wheezes, rales, rhonchi Heart: Distant heart sounds, RRR, no murmurs, gallops, or rubs Abdomen: Soft, obese, non-tender, non-distended, BS + Extremities: No cyanosis, clubbing. Pitting (+2) + non-pitting edema extending up  to the knee (improved). No sacral or abdominal edema on exam.  Neurologic: Alert & oriented x3, cranial nerves II-XII intact, strength grossly intact, sensation intact to light touch   Assessment & Plan:   Please see problem based assessment and plan.

## 2015-01-18 NOTE — Assessment & Plan Note (Signed)
Restart Lipitor 40 mg daily.

## 2015-01-18 NOTE — Assessment & Plan Note (Addendum)
Patient improved today, decreased DOE, LE edema improved, says he feels better overall. Diuresing well. Weight decreased 10 lbs today since his visit 2 days ago. No crackles on pulmonary exam, LE edema decreased.  -Repeat BMP -Continue Lasix 80 mg bid for today and tomorrow, then return to normal dose; 40 mg bid -Continue Toprol-XL 50 mg daily, Lisinopril 40 mg daily, Hydralazine 25 mg tid, Spironolactone 25 mg daily -Add Imdur 30 mg daily for further afterload reduction. Advised patient to take at night to avoid lightheadedness as he is already having some mild symptoms w/ Hydralazine. -Continue Digoxin.  -Patient to return in 1 week

## 2015-01-18 NOTE — Patient Instructions (Signed)
1. Please schedule follow up appointment for 1 week.  2. Please take all medications as previously prescribed with the following changes:  Start taking Imdur 30 mg daily (would recommend taking at night to avoid potential lightheadedness.  Start taking Lipitor 40 mg every night.   Take Lasix 80 mg twice daily for 2 more days, then go back to taking 40 mg twice daily.   I will call you if your potassium is low.  3. If you have worsening of your symptoms or new symptoms arise, please call the clinic ([phone]), or go to the ER immediately if symptoms are severe.  You have done a great job in taking all your medications. Please continue to do this.

## 2015-01-18 NOTE — Assessment & Plan Note (Signed)
BP Readings from Last 3 Encounters:  01/18/15 144/102  01/16/15 167/116  06/03/14 119/76    Lab Results  Component Value Date   NA 141 01/16/2015   K 4.0 01/16/2015   CREATININE 1.44* 01/16/2015    Assessment: Blood pressure control: mildly elevated Progress toward BP goal:  improved Comments: BP improved, yet still elevated on medications restarted 2 days ago. Mark Baker' expect BP to be completely normal only after 2 days of restarting medications, however, could still use afterload reduction w/ Imdur.   Plan: Medications:  continue current medications; Spironolactone 25 mg daily, Toprol-XL 50 mg daily, Hydralazine 25 mg tid, Lisinopril 40 mg daily, Lasix 80 mg bid (for now). Will add Imdur 30 mg daily for afterload reduction, will also help to bring down BP.  Educational resources provided: brochure (denies) Self management tools provided:   Other plans: Check BMP today. Patient to return in 1 week.

## 2015-01-25 ENCOUNTER — Ambulatory Visit: Payer: BLUE CROSS/BLUE SHIELD | Admitting: Internal Medicine

## 2015-01-30 ENCOUNTER — Telehealth: Payer: Self-pay | Admitting: Internal Medicine

## 2015-01-30 NOTE — Telephone Encounter (Signed)
Call to patient to confirm appointment for 01/31/15 at 9:15 lmtcb

## 2015-01-31 ENCOUNTER — Ambulatory Visit: Payer: BLUE CROSS/BLUE SHIELD | Admitting: Internal Medicine

## 2015-02-01 ENCOUNTER — Ambulatory Visit: Payer: BLUE CROSS/BLUE SHIELD | Admitting: Internal Medicine

## 2015-06-21 ENCOUNTER — Other Ambulatory Visit: Payer: Self-pay | Admitting: Internal Medicine

## 2015-06-21 ENCOUNTER — Other Ambulatory Visit (HOSPITAL_COMMUNITY): Payer: Self-pay | Admitting: Internal Medicine

## 2015-07-21 ENCOUNTER — Other Ambulatory Visit (HOSPITAL_COMMUNITY): Payer: Self-pay | Admitting: Internal Medicine

## 2015-08-22 ENCOUNTER — Other Ambulatory Visit (HOSPITAL_COMMUNITY): Payer: Self-pay | Admitting: Internal Medicine

## 2015-08-22 MED FILL — LISINOPRIL 40 MG TABLET: 40 | 30 days supply | Qty: 30 | Fill #1

## 2015-08-22 MED FILL — METOPROLOL SUCC ER 50 MG TA: 50 | 30 days supply | Qty: 30 | Fill #2

## 2015-08-22 MED FILL — SPIRONOLACTONE 25 MG TABLET: 25 | 30 days supply | Qty: 30 | Fill #0

## 2015-09-01 MED FILL — FUROSEMIDE 40 MG TABLET: 40 | 30 days supply | Qty: 120 | Fill #1

## 2015-09-21 MED FILL — SPIRONOLACTONE 25 MG TABLET: 25 | 30 days supply | Qty: 30 | Fill #1

## 2015-09-21 MED FILL — METOPROLOL SUCC ER 50 MG TA: 50 | 30 days supply | Qty: 30 | Fill #3

## 2015-09-21 MED FILL — LISINOPRIL 40 MG TABLET: 40 | 8 days supply | Qty: 8 | Fill #2

## 2015-09-28 MED FILL — FUROSEMIDE 40 MG TABLET: 40 | 30 days supply | Qty: 120 | Fill #2

## 2015-10-18 ENCOUNTER — Other Ambulatory Visit (HOSPITAL_COMMUNITY): Payer: Self-pay | Admitting: Internal Medicine

## 2015-10-18 MED FILL — METOPROLOL SUCC ER 50 MG TA: 50 | 30 days supply | Qty: 30 | Fill #4

## 2015-10-18 MED FILL — SPIRONOLACTONE 25 MG TABLET: 25 | 30 days supply | Qty: 30 | Fill #2

## 2015-10-18 MED FILL — LISINOPRIL 40 MG TABLET: 40 | 30 days supply | Qty: 30 | Fill #0

## 2015-10-23 MED FILL — BIDIL TABLET: 20-37.5 | 30 days supply | Qty: 90 | Fill #0

## 2015-10-25 MED FILL — FUROSEMIDE 40 MG TABLET: 40 | 30 days supply | Qty: 120 | Fill #3

## 2015-11-22 MED FILL — LISINOPRIL 40 MG TABLET: 40 | 30 days supply | Qty: 30 | Fill #1

## 2015-11-22 MED FILL — SPIRONOLACTONE 25 MG TABLET: 25 | 30 days supply | Qty: 30 | Fill #3

## 2015-11-22 MED FILL — METOPROLOL SUCC ER 50 MG TA: 50 | 30 days supply | Qty: 30 | Fill #5

## 2015-11-22 MED FILL — BIDIL TABLET: 20-37.5 | 30 days supply | Qty: 90 | Fill #0

## 2015-11-24 MED FILL — FUROSEMIDE 40 MG TABLET: 40 | 30 days supply | Qty: 120 | Fill #4

## 2015-12-19 MED FILL — SPIRONOLACTONE 25 MG TABLET: 25 | 30 days supply | Qty: 30 | Fill #4

## 2015-12-19 MED FILL — BIDIL TABLET: 20-37.5 | 30 days supply | Qty: 90 | Fill #0 | Status: TO

## 2015-12-19 MED FILL — FUROSEMIDE 40 MG TABLET: 40 | 30 days supply | Qty: 120 | Fill #5

## 2015-12-19 MED FILL — METOPROLOL SUCC ER 50 MG TA: 50 | 30 days supply | Qty: 30 | Fill #6

## 2015-12-19 MED FILL — LISINOPRIL 40 MG TABLET: 40 | 30 days supply | Qty: 30 | Fill #0

## 2016-01-17 MED FILL — METOPROLOL SUCC ER 50 MG TA: 50 | 30 days supply | Qty: 30 | Fill #7

## 2016-01-17 MED FILL — SPIRONOLACTONE 25 MG TABLET: 25 | 30 days supply | Qty: 30 | Fill #5

## 2016-01-18 MED FILL — FUROSEMIDE 40 MG TABLET: 40 | 30 days supply | Qty: 120 | Fill #0

## 2016-01-19 MED FILL — LISINOPRIL 40 MG TABLET: 40 | 4 days supply | Qty: 4 | Fill #1

## 2016-02-14 MED FILL — METOPROLOL SUCC ER 50 MG TA: 50 | 30 days supply | Qty: 30 | Fill #8

## 2016-02-14 MED FILL — FUROSEMIDE 40 MG TABLET: 40 | 30 days supply | Qty: 120 | Fill #1

## 2016-02-14 MED FILL — SPIRONOLACTONE 25 MG TABLET: 25 | 30 days supply | Qty: 30 | Fill #6

## 2016-03-05 MED FILL — SPIRONOLACTONE 25 MG TABLET: 25 | 30 days supply | Qty: 60 | Fill #0

## 2016-03-05 MED FILL — LISINOPRIL 40 MG TABLET: 40 | 30 days supply | Qty: 30 | Fill #0

## 2016-03-13 MED FILL — FUROSEMIDE 40 MG TABLET: 40 | 30 days supply | Qty: 120 | Fill #2

## 2016-03-13 MED FILL — METOPROLOL SUCC ER 50 MG TA: 50 | 30 days supply | Qty: 30 | Fill #9

## 2016-03-22 MED FILL — XENICAL 120 MG CAPSULE: 120 | 30 days supply | Qty: 90 | Fill #0

## 2016-04-04 MED FILL — LISINOPRIL 40 MG TABLET: 40 | 30 days supply | Qty: 30 | Fill #1

## 2016-04-04 MED FILL — SPIRONOLACTONE 25 MG TABLET: 25 | 30 days supply | Qty: 60 | Fill #1

## 2016-04-16 MED FILL — METOPROLOL SUCC ER 50 MG TA: 50 | 30 days supply | Qty: 30 | Fill #10

## 2016-05-08 MED FILL — SPIRONOLACTONE 25 MG TABLET: 25 | 30 days supply | Qty: 60 | Fill #2

## 2016-05-08 MED FILL — LISINOPRIL 40 MG TABLET: 40 | 30 days supply | Qty: 30 | Fill #2

## 2016-05-08 MED FILL — METOPROLOL SUCC ER 50 MG TA: 50 | 30 days supply | Qty: 30 | Fill #11

## 2016-06-15 ENCOUNTER — Emergency Department (HOSPITAL_COMMUNITY)
Admission: EM | Admit: 2016-06-15 | Discharge: 2016-06-15 | Disposition: A | Discharge status: Home / Self Care | Payer: Managed Care, Other (non HMO) | Attending: Emergency Medicine | Admitting: Emergency Medicine

## 2016-06-15 ENCOUNTER — Emergency Department (HOSPITAL_COMMUNITY): Payer: Managed Care, Other (non HMO)

## 2016-06-15 ENCOUNTER — Encounter (HOSPITAL_COMMUNITY): Payer: Self-pay | Admitting: *Deleted

## 2016-06-15 DIAGNOSIS — R600 Localized edema: Secondary | ICD-10-CM | POA: Diagnosis not present

## 2016-06-15 DIAGNOSIS — B9789 Other viral agents as the cause of diseases classified elsewhere: Secondary | ICD-10-CM

## 2016-06-15 DIAGNOSIS — J069 Acute upper respiratory infection, unspecified: Secondary | ICD-10-CM | POA: Insufficient documentation

## 2016-06-15 DIAGNOSIS — I5042 Chronic combined systolic (congestive) and diastolic (congestive) heart failure: Secondary | ICD-10-CM | POA: Diagnosis not present

## 2016-06-15 DIAGNOSIS — J40 Bronchitis, not specified as acute or chronic: Secondary | ICD-10-CM | POA: Insufficient documentation

## 2016-06-15 DIAGNOSIS — R05 Cough: Secondary | ICD-10-CM | POA: Diagnosis present

## 2016-06-15 DIAGNOSIS — I110 Hypertensive heart disease with heart failure: Secondary | ICD-10-CM | POA: Diagnosis not present

## 2016-06-15 LAB — COMPREHENSIVE METABOLIC PANEL
ALK PHOS: 51 U/L (ref 38–126)
ALT: 22 U/L (ref 17–63)
AST: 28 U/L (ref 15–41)
Albumin: 4 g/dL (ref 3.5–5.0)
Anion gap: 9 (ref 5–15)
BUN: 16 mg/dL (ref 6–20)
CHLORIDE: 97 mmol/L — AB (ref 101–111)
CO2: 31 mmol/L (ref 22–32)
CREATININE: 1.45 mg/dL — AB (ref 0.61–1.24)
Calcium: 9.2 mg/dL (ref 8.9–10.3)
GFR calc Af Amer: >60 mL/min (ref >60)
GFR calc non Af Amer: >60 mL/min (ref >60)
GLUCOSE: 107 mg/dL — AB (ref 65–99)
Potassium: 4.1 mmol/L (ref 3.5–5.1)
SODIUM: 137 mmol/L (ref 135–145)
Total Bilirubin: 1.4 mg/dL — ABNORMAL HIGH (ref 0.3–1.2)
Total Protein: 7.2 g/dL (ref 6.5–8.1)

## 2016-06-15 LAB — CBC WITH DIFFERENTIAL/PLATELET
Basophils Absolute: 0.1 10*3/uL (ref 0.0–0.1)
Basophils Relative: 1 %
EOS ABS: 0.4 10*3/uL (ref 0.0–0.7)
EOS PCT: 4 %
HCT: 44.9 % (ref 39.0–52.0)
HEMOGLOBIN: 14.1 g/dL (ref 13.0–17.0)
LYMPHS PCT: 20 %
Lymphs Abs: 2.1 10*3/uL (ref 0.7–4.0)
MCH: 28.2 pg (ref 26.0–34.0)
MCHC: 31.4 g/dL (ref 30.0–36.0)
MCV: 89.8 fL (ref 78.0–100.0)
MONO ABS: 1.2 10*3/uL — AB (ref 0.1–1.0)
Monocytes Relative: 12 %
NEUTROS PCT: 63 %
Neutro Abs: 6.6 10*3/uL (ref 1.7–7.7)
PLATELETS: 277 10*3/uL (ref 150–400)
RBC: 5 MIL/uL (ref 4.22–5.81)
RDW: 12.9 % (ref 11.5–15.5)
WBC: 10.4 10*3/uL (ref 4.0–10.5)

## 2016-06-15 LAB — I-STAT TROPONIN, ED: Troponin i, poc: 0.07 ng/mL (ref 0.00–0.08)

## 2016-06-15 MED ORDER — ATORVASTATIN CALCIUM 40 MG PO TABS: 40.0000 mg | ORAL_TABLET | Freq: Every day | ORAL | 0 refills | Status: DC

## 2016-06-15 MED ORDER — FLUTICASONE PROPIONATE 50 MCG/ACT NA SUSP: 2.0000 | Freq: Every day | NASAL | 2 refills | Status: DC

## 2016-06-15 MED ORDER — LISINOPRIL 40 MG PO TABS: 40.0000 mg | ORAL_TABLET | Freq: Every day | ORAL | 0 refills | Status: DC

## 2016-06-15 MED ORDER — DEXAMETHASONE SODIUM PHOSPHATE 10 MG/ML IJ SOLN
10.0000 mg | Freq: Once | INTRAMUSCULAR | Status: AC
  Administered 2016-06-15: 10 mg via INTRAMUSCULAR
  Filled 2016-06-15: qty 1

## 2016-06-15 MED ORDER — LISINOPRIL 20 MG PO TABS
40.0000 mg | ORAL_TABLET | Freq: Once | ORAL | Status: AC
Start: 2016-06-15 — End: 2016-06-15
  Administered 2016-06-15: 40 mg via ORAL
  Filled 2016-06-15 (×2): qty 2

## 2016-06-15 MED ORDER — BENZONATATE 100 MG PO CAPS
100.0000 mg | ORAL_CAPSULE | Freq: Three times a day (TID) | ORAL | 0 refills | Status: DC
Start: 2016-06-15 — End: 2016-07-05

## 2016-06-15 NOTE — ED Triage Notes (Signed)
The pt is c/o a cough for 2 weeks  Tonight when he coughed he had some blood tinged sputum and thought he needed to be checked

## 2016-06-15 NOTE — Discharge Instructions (Signed)
1. Medications: flonase, mucinex, tessalon, usual home medications °2. Treatment: rest, drink plenty of fluids, take tylenol or ibuprofen for fever control °3. Follow Up: Please followup with your primary doctor in 3 days for discussion of your diagnoses and further evaluation after today's visit; if you do not have a primary care doctor use the resource guide provided to find one; Return to the ER for high fevers, difficulty breathing or other concerning symptoms ° °

## 2016-06-15 NOTE — ED Notes (Signed)
Ambulated with patient, sats dropped to 91%, recovered to 97% when at rest

## 2016-06-15 NOTE — ED Provider Notes (Signed)
[facility]-EMER[facility]ENCY DEPT Provider Note   CSN: [id number] Arrival date & time: 06/15/16  0110     History   Chief Complaint Chief Complaint  Patient presents with  . Cough    HPI Mark Baker is a 35 y.o. 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 Patient reports that when he coughed tonight he had blood-tinged sputum and felt as if he should be evaluated. Patient denies pink frothy sputum, shortness of breath, swelling of his legs, dyspnea on exertion. He reports history of CHF exacerbations and states that tonight does not feel like one. He states that he often has orthopnea with his exacerbations and has been able to lie flat in his bed even tonight without difficulty breathing. He does not wear a CPAP machine at night because he does not have one. Patient reports "Fevers" measured at less than 100.0 at home.  He reports that at the onset of the symptoms he had a URI with rhinorrhea, nasal congestion, otalgia all of which has resolved.  Cardiology: Michelene Gardener  The history is provided by the patient, medical records and a significant other. No language interpreter was used.    Past Medical History:  Diagnosis Date  . Asthma    childhood asthma no exacerbation since age 35   . Chronic combined systolic and diastolic CHF (congestive heart failure) (HCC)    a) ECHO (11/2012): EF 40-45%, RV fx difficult to see, nl size b) ECHO (05/2014): EF 20-25%, grade 2 DD, RV mildly dilated and sys fx mod reduced  . Hypertension   . NICM (nonischemic cardiomyopathy) (HCC)    a) (11/2012): normal coronaries  . OSA (obstructive sleep apnea)     Patient Active Problem List   Diagnosis Date Noted  . Chronic systolic heart failure (HCC) 06/03/2014  . Hyperlipidemia 11/25/2012  .  Prolonged QT interval 11/24/2012  . Healthcare maintenance 09/03/2012  . HTN (hypertension) 02/19/2012  . Acute on chronic combined systolic and diastolic CHF (congestive heart failure) (HCC) 02/12/2012  . Sleep apnea 02/10/2012  . Morbid obesity (HCC) 02/10/2012    Past Surgical History:  Procedure Laterality Date  . ANTERIOR CRUCIATE LIGAMENT REPAIR     right  . LEFT HEART CATHETERIZATION WITH CORONARY ANGIOGRAM N/A 11/24/2012   Procedure: LEFT HEART CATHETERIZATION WITH CORONARY ANGIOGRAM;  Surgeon: Peter M Swaziland, MD;  Location: [facility] CATH LAB;  Service: Cardiovascular;  Laterality: N/A;       Home Medications    Prior to Admission medications   Medication Sig Start Date End Date Taking? Authorizing Provider  furosemide (LASIX) 40 MG tablet Take 1 tablet (40 mg total) by mouth 2 (two) times daily. 01/16/15  Yes Courtney Paris, MD  isosorbide-hydrALAZINE (BIDIL) 20-37.5 MG tablet Take 1 tablet by mouth 3 (three) times daily.   Yes Historical Provider, MD  lisinopril (PRINIVIL,ZESTRIL) 40 MG tablet Take 1 tablet (40 mg total) by mouth daily. 01/16/15  Yes Courtney Paris, MD  metoprolol succinate (TOPROL-XL) 50 MG 24 hr tablet Take 1 tablet (50 mg total) by mouth at bedtime. Take with or immediately following a meal. 01/16/15  Yes Courtney Paris, MD  spironolactone (ALDACTONE) 25 MG tablet Take 1 tablet (25 mg total) by mouth daily. 01/16/15  Yes Courtney Paris, MD  atorvastatin (LIPITOR) 40 MG tablet Take 1  tablet (40 mg total) by mouth daily. Patient not taking: Reported on 06/15/2016 01/18/15   Courtney Paris, MD  atorvastatin (LIPITOR) 40 MG tablet Take 1 tablet (40 mg total) by mouth daily. 06/15/16   Julissa Browning, PA-C  benzonatate (TESSALON) 100 MG capsule Take 1 capsule (100 mg total) by mouth every 8 (eight) hours. 06/15/16   Deontay Ladnier, PA-C  digoxin (LANOXIN) 0.25 MG tablet Take 1 tablet (250 mcg total) by mouth daily. Patient not taking: Reported on 06/15/2016 01/16/15   Courtney Paris,  MD  fluticasone [facility]) 50 MCG/ACT nasal spray Place 2 sprays into both nostrils daily. 06/15/16   Adrain Nesbit, PA-C  hydrALAZINE (APRESOLINE) 25 MG tablet Take 1 tablet (25 mg total) by mouth 3 (three) times daily. Patient not taking: Reported on 06/15/2016 01/16/15   Courtney Paris, MD  isosorbide mononitrate (IMDUR) 30 MG 24 hr tablet Take 1 tablet (30 mg total) by mouth daily. Patient not taking: Reported on 06/15/2016 01/18/15   Courtney Paris, MD  lisinopril (PRINIVIL,ZESTRIL) 40 MG tablet Take 1 tablet (40 mg total) by mouth daily. 06/15/16   Sabastian Raimondi, PA-C  metoprolol succinate (TOPROL-XL) 50 MG 24 hr tablet TAKE 1 TABLET BY MOUTH AT BEDTIME WITH OR IMMEDIATELY FOLLOWING A MEAL Patient not taking: Reported on 06/15/2016 06/21/15   Hyacinth Meeker, MD  potassium chloride SA (K-DUR,KLOR-CON) 20 MEQ tablet Take 1 tablet (20 mEq total) by mouth 2 (two) times daily. Patient not taking: Reported on 06/15/2016 05/27/14   Denton Brick, MD  spironolactone (ALDACTONE) 25 MG tablet TAKE 1 TABLET BY MOUTH DAILY. NEEDS OFFICE VISIT Patient not taking: Reported on 06/15/2016 08/22/15   Dolores Patty, MD    Family History Family History  Problem Relation Age of Onset  . Hypertension Father   . Heart failure Father     paternal side of family   . Hypertension Sister   . Stroke Maternal Grandmother   . Hypertension      maternal side of family     Social History Social History  Substance Use Topics  . Smoking status: Never Smoker  . Smokeless tobacco: Never Used  . Alcohol use 0.0 oz/week     Comment: seldom.     Allergies   Aspirin   Review of Systems Review of Systems  Respiratory: Positive for cough.   All other systems reviewed and are negative.    Physical Exam Updated Vital Signs BP 169/87 (BP Location: Left Arm)   Pulse 110   Temp 97.9 F (36.6 C) (Oral)   Resp 22   Ht 6\' 3"  (1.905 m)   Wt (!) 234.1 kg   SpO2 95%   BMI 64.52 kg/m   Physical Exam    Constitutional: He appears well-developed and well-nourished. No distress.  Awake, alert, nontoxic appearance Obese  HENT:  Head: Normocephalic and atraumatic.  Mouth/Throat: Oropharynx is clear and moist. No oropharyngeal exudate.  Eyes: Conjunctivae are normal. No scleral icterus.  Neck: Normal range of motion. Neck supple.  Cardiovascular: Regular rhythm and intact distal pulses.  Tachycardia present.   Pulses:      Radial pulses are 2+ on the right side, and 2+ on the left side.       Dorsalis pedis pulses are 2+ on the right side, and 2+ on the left side.  Mild tachycardia around 110.    Pulmonary/Chest: Effort normal. No respiratory distress. He has decreased breath sounds (throughout). He has no wheezes.  Equal chest  expansion  Abdominal: Soft. Bowel sounds are normal. He exhibits no mass. There is no tenderness. There is no rebound and no guarding.  Musculoskeletal: Normal range of motion. He exhibits edema ( 1+ chronic, nonpitting edema).  Neurological: He is alert.  Speech is clear and goal oriented Moves extremities without ataxia  Skin: Skin is warm and dry. He is not diaphoretic.  Psychiatric: He has a normal mood and affect.  Nursing note and vitals reviewed.    ED Treatments / Results  Labs (all labs ordered are listed, but only abnormal results are displayed) Labs Reviewed  CBC WITH DIFFERENTIAL/PLATELET - Abnormal; Notable for the following:       Result Value   Monocytes Absolute 1.2 (*)    All other components within normal limits  COMPREHENSIVE METABOLIC PANEL - Abnormal; Notable for the following:    Chloride 97 (*)    Glucose, Bld 107 (*)    Creatinine, Ser 1.45 (*)    Total Bilirubin 1.4 (*)    All other components within normal limits  I-STAT TROPOININ, ED    EKG  EKG Interpretation  Date/Time:  Saturday June 15 2016 02:45:08 EDT Ventricular Rate:  106 PR Interval:    QRS Duration: 113 QT Interval:  387 QTC Calculation: 514 R  Axis:   1 Text Interpretation:  Sinus tachycardia Left atrial enlargement Borderline intraventricular conduction delay Prolonged QT interval Baseline wander in lead(s) II aVF V6 HR now increased Confirmed by Erroll Luna [phone]) on 06/15/2016 6:56:57 AM        Radiology Dg Chest 2 View  Result Date: 06/15/2016 CLINICAL DATA:  Cough for 2 weeks. History of congestive heart failure. EXAM: CHEST  2 VIEW COMPARISON:  Chest radiograph 05/21/2014 FINDINGS: Unchanged advanced cardiomegaly. Mild pulmonary edema. No focal airspace consolidation. No pneumothorax or sizable pleural effusion. IMPRESSION: Advanced cardiomegaly, unchanged, with mild pulmonary edema. Electronically Signed   By: Deatra Robinson M.D.   On: 06/15/2016 02:19    Procedures Procedures (including critical care time)  Medications Ordered in ED Medications  lisinopril (PRINIVIL,ZESTRIL) tablet 40 mg (40 mg Oral Given 06/15/16 6045)  dexamethasone (DECADRON) injection 10 mg (10 mg Intramuscular Given 06/15/16 0326)     Initial Impression / Assessment and Plan / ED Course  I have reviewed the triage vital signs and the nursing notes.  Pertinent labs & imaging results that were available during my care of the patient were reviewed by me and considered in my medical decision making (see chart for details).  Clinical Course  Value Comment By Time  Troponin i, poc: 0.07 Negative. Dahlia Client Randi Poullard, PA-C 11/04 0745  WBC: 10.4 No leukocytosis Dierdre Forth, PA-C 11/04 0745  DG Chest 2 View Mild pulmonary edema. Patient reports this is baseline. Dahlia Client Marrio Scribner, PA-C 11/04 0745  Pulse Rate: 89 Tachycardia resolved. Dahlia Client Adasia Hoar, PA-C 11/04 0745  BP: (!) 158/109 Persistent mild hypertension.  Patient reports he has been out of his lisinopril. Dahlia Client Aahan Marques, PA-C 11/04 0745  SpO2: (!) 89 % 1 hypoxic reading however do not believe that this is legitimate as subsequent readings are not hypoxic. Patient  ambulated in the emergency department with an oxygen saturation of 91%.  He reports he is breathing at baseline while walking. He appears mildly dyspnea but not distressed in any way. Dierdre Forth, PA-C 11/04 [phone]    Patient with URI presents today with concerns of hematemesis. This occurred after 2 weeks of symptoms and after an intense bout of coughing. Patient has long-standing  history of CHF. He reports this does not in any way feel like an exacerbation. He states he has no orthopnea, increased swelling in his legs or increased dyspnea on exertion. He reports his breathing is at baseline and he feels well.  Symptoms clinically consistent with bronchitis.  No wheezing. No albuterol given due to prolonged QT. Flonase and Tessalon given. Patient tachycardia resolved. He reports that he feels well. Labs and EKG are reassuring. We'll discharge home with 48 hour follow-up with cardiology. Discussed reasons to return to emergency department including worsening shortness of breath, swelling of his legs or other concerns. Refills of home medications given.  With hypertension in the emergency department. He has been out of his lisinopril. Given here in the department with improvement in his blood pressure to 155/87.  Patient without significant risk factors for PE. Doubt this is the cause of his tachycardia or cough.  Patient denies history of DVT.  Final Clinical Impressions(s) / ED Diagnoses   Final diagnoses:  Viral URI with cough  Bronchitis    New Prescriptions New Prescriptions   ATORVASTATIN (LIPITOR) 40 MG TABLET    Take 1 tablet (40 mg total) by mouth daily.   BENZONATATE (TESSALON) 100 MG CAPSULE    Take 1 capsule (100 mg total) by mouth every 8 (eight) hours.   FLUTICASONE (FLONASE) 50 MCG/ACT NASAL SPRAY    Place 2 sprays into both nostrils daily.        Dahlia ClientHannah Desean Heemstra, PA-C 06/15/16 [phone]    Tomasita CrumbleAdeleke Oni, MD 06/15/16 1408

## 2016-06-19 ENCOUNTER — Other Ambulatory Visit: Payer: Self-pay | Admitting: Internal Medicine

## 2016-06-19 MED FILL — METOPROLOL SUCC ER 50 MG TA: 50 | 30 days supply | Qty: 30 | Fill #0

## 2016-06-19 MED FILL — LISINOPRIL 40 MG TABLET: 40 | 30 days supply | Qty: 30 | Fill #0

## 2016-06-19 MED FILL — SPIRONOLACTONE 25 MG TABLET: 25 | 30 days supply | Qty: 60 | Fill #0

## 2016-06-19 NOTE — Telephone Encounter (Signed)
Last visit 01/18/2015-will send refill request to pcp for review and appt request to front office, please advise.Criss Alvine, Darlene Cassady11/8/201711:10 AM

## 2016-06-25 ENCOUNTER — Encounter: Payer: Self-pay | Admitting: Internal Medicine

## 2016-07-05 ENCOUNTER — Emergency Department (HOSPITAL_COMMUNITY): Payer: Managed Care, Other (non HMO)

## 2016-07-05 ENCOUNTER — Encounter (HOSPITAL_COMMUNITY): Payer: Self-pay

## 2016-07-05 ENCOUNTER — Inpatient Hospital Stay (HOSPITAL_COMMUNITY)
Admission: EM | Admit: 2016-07-05 | Discharge: 2016-07-08 | DRG: 291 | Disposition: A | Discharge status: Home / Self Care | Payer: Managed Care, Other (non HMO) | Attending: Internal Medicine | Admitting: Internal Medicine

## 2016-07-05 DIAGNOSIS — N182 Chronic kidney disease, stage 2 (mild): Secondary | ICD-10-CM | POA: Diagnosis not present

## 2016-07-05 DIAGNOSIS — E785 Hyperlipidemia, unspecified: Secondary | ICD-10-CM | POA: Diagnosis present

## 2016-07-05 DIAGNOSIS — I428 Other cardiomyopathies: Secondary | ICD-10-CM | POA: Diagnosis present

## 2016-07-05 DIAGNOSIS — N183 Chronic kidney disease, stage 3 unspecified: Secondary | ICD-10-CM | POA: Diagnosis present

## 2016-07-05 DIAGNOSIS — I2781 Cor pulmonale (chronic): Secondary | ICD-10-CM | POA: Diagnosis present

## 2016-07-05 DIAGNOSIS — I5022 Chronic systolic (congestive) heart failure: Secondary | ICD-10-CM

## 2016-07-05 DIAGNOSIS — Z886 Allergy status to analgesic agent status: Secondary | ICD-10-CM

## 2016-07-05 DIAGNOSIS — Z79899 Other long term (current) drug therapy: Secondary | ICD-10-CM | POA: Diagnosis not present

## 2016-07-05 DIAGNOSIS — Z8249 Family history of ischemic heart disease and other diseases of the circulatory system: Secondary | ICD-10-CM

## 2016-07-05 DIAGNOSIS — I5043 Acute on chronic combined systolic (congestive) and diastolic (congestive) heart failure: Secondary | ICD-10-CM | POA: Diagnosis not present

## 2016-07-05 DIAGNOSIS — G4733 Obstructive sleep apnea (adult) (pediatric): Secondary | ICD-10-CM | POA: Diagnosis present

## 2016-07-05 DIAGNOSIS — Z9114 Patient's other noncompliance with medication regimen: Secondary | ICD-10-CM

## 2016-07-05 DIAGNOSIS — N1832 Chronic kidney disease, stage 3b: Secondary | ICD-10-CM | POA: Diagnosis present

## 2016-07-05 DIAGNOSIS — G4730 Sleep apnea, unspecified: Secondary | ICD-10-CM | POA: Diagnosis present

## 2016-07-05 DIAGNOSIS — I130 Hypertensive heart and chronic kidney disease with heart failure and stage 1 through stage 4 chronic kidney disease, or unspecified chronic kidney disease: Principal | ICD-10-CM | POA: Diagnosis present

## 2016-07-05 DIAGNOSIS — I10 Essential (primary) hypertension: Secondary | ICD-10-CM | POA: Diagnosis present

## 2016-07-05 DIAGNOSIS — Z6841 Body Mass Index (BMI) 40.0 and over, adult: Secondary | ICD-10-CM | POA: Diagnosis not present

## 2016-07-05 DIAGNOSIS — I509 Heart failure, unspecified: Secondary | ICD-10-CM | POA: Diagnosis not present

## 2016-07-05 DIAGNOSIS — R05 Cough: Secondary | ICD-10-CM | POA: Diagnosis present

## 2016-07-05 LAB — I-STAT TROPONIN, ED: TROPONIN I, POC: 0.05 ng/mL (ref 0.00–0.08)

## 2016-07-05 LAB — BASIC METABOLIC PANEL
ANION GAP: 7 (ref 5–15)
BUN: 16 mg/dL (ref 6–20)
CALCIUM: 9.2 mg/dL (ref 8.9–10.3)
CO2: 30 mmol/L (ref 22–32)
Chloride: 103 mmol/L (ref 101–111)
Creatinine, Ser: 1.51 mg/dL — ABNORMAL HIGH (ref 0.61–1.24)
GFR calc Af Amer: >60 mL/min (ref >60)
GFR, EST NON AFRICAN AMERICAN: 58 mL/min — AB (ref >60)
GLUCOSE: 131 mg/dL — AB (ref 65–99)
POTASSIUM: 4.2 mmol/L (ref 3.5–5.1)
SODIUM: 140 mmol/L (ref 135–145)

## 2016-07-05 LAB — CBC WITH DIFFERENTIAL/PLATELET
BASOS ABS: 0.1 10*3/uL (ref 0.0–0.1)
BASOS PCT: 1 %
EOS PCT: 4 %
Eosinophils Absolute: 0.4 10*3/uL (ref 0.0–0.7)
HCT: 45.2 % (ref 39.0–52.0)
Hemoglobin: 14.3 g/dL (ref 13.0–17.0)
Lymphocytes Relative: 28 %
Lymphs Abs: 2.3 10*3/uL (ref 0.7–4.0)
MCH: 29.1 pg (ref 26.0–34.0)
MCHC: 31.6 g/dL (ref 30.0–36.0)
MCV: 92.1 fL (ref 78.0–100.0)
MONO ABS: 0.9 10*3/uL (ref 0.1–1.0)
Monocytes Relative: 11 %
Neutro Abs: 4.7 10*3/uL (ref 1.7–7.7)
Neutrophils Relative %: 56 %
PLATELETS: 214 10*3/uL (ref 150–400)
RBC: 4.91 MIL/uL (ref 4.22–5.81)
RDW: 13.6 % (ref 11.5–15.5)
WBC: 8.3 10*3/uL (ref 4.0–10.5)

## 2016-07-05 LAB — D-DIMER, QUANTITATIVE (NOT AT ARMC)

## 2016-07-05 LAB — BRAIN NATRIURETIC PEPTIDE: B NATRIURETIC PEPTIDE 5: 327.3 pg/mL — AB (ref 0.0–100.0)

## 2016-07-05 MED ORDER — ENOXAPARIN SODIUM 120 MG/0.8ML ~~LOC~~ SOLN
0.5000 mg/kg | SUBCUTANEOUS | Status: DC
  Administered 2016-07-05 – 2016-07-07 (×3): 120 mg via SUBCUTANEOUS
  Filled 2016-07-05 (×3): qty 0.8

## 2016-07-05 MED ORDER — ACETAMINOPHEN 650 MG RE SUPP: 650.0000 mg | Freq: Four times a day (QID) | RECTAL | Status: DC | PRN

## 2016-07-05 MED ORDER — LISINOPRIL 40 MG PO TABS
40.0000 mg | ORAL_TABLET | Freq: Every day | ORAL | Status: DC
  Administered 2016-07-06 – 2016-07-08 (×3): 40 mg via ORAL
  Filled 2016-07-05 (×3): qty 1

## 2016-07-05 MED ORDER — DICLOFENAC SODIUM 1 % TD GEL
4.0000 g | Freq: Four times a day (QID) | TRANSDERMAL | Status: DC
  Administered 2016-07-06 – 2016-07-07 (×7): 4 g via TOPICAL
  Filled 2016-07-05 (×2): qty 100

## 2016-07-05 MED ORDER — OXYCODONE-ACETAMINOPHEN 5-325 MG PO TABS
1.0000 | ORAL_TABLET | Freq: Once | ORAL | Status: AC
  Administered 2016-07-05: 1 via ORAL
  Filled 2016-07-05: qty 1

## 2016-07-05 MED ORDER — FUROSEMIDE 20 MG PO TABS
80.0000 mg | ORAL_TABLET | Freq: Once | ORAL | Status: AC
  Administered 2016-07-05: 80 mg via ORAL
  Filled 2016-07-05: qty 4

## 2016-07-05 MED ORDER — ACETAMINOPHEN 325 MG PO TABS: 650.0000 mg | ORAL_TABLET | Freq: Four times a day (QID) | ORAL | Status: DC | PRN

## 2016-07-05 MED ORDER — SODIUM CHLORIDE 0.9% FLUSH
3.0000 mL | Freq: Two times a day (BID) | INTRAVENOUS | Status: DC
  Administered 2016-07-05 – 2016-07-08 (×6): 3 mL via INTRAVENOUS

## 2016-07-05 MED ORDER — METOPROLOL SUCCINATE ER 50 MG PO TB24
50.0000 mg | ORAL_TABLET | Freq: Every day | ORAL | Status: DC
  Administered 2016-07-06 – 2016-07-08 (×3): 50 mg via ORAL
  Filled 2016-07-05 (×3): qty 1

## 2016-07-05 MED ORDER — ATORVASTATIN CALCIUM 40 MG PO TABS
40.0000 mg | ORAL_TABLET | Freq: Every day | ORAL | Status: DC
  Administered 2016-07-05 – 2016-07-08 (×4): 40 mg via ORAL
  Filled 2016-07-05 (×4): qty 1

## 2016-07-05 MED ORDER — FUROSEMIDE 10 MG/ML IJ SOLN
80.0000 mg | Freq: Two times a day (BID) | INTRAMUSCULAR | Status: DC
  Administered 2016-07-06: 80 mg via INTRAVENOUS
  Filled 2016-07-05: qty 8

## 2016-07-05 MED ORDER — SPIRONOLACTONE 25 MG PO TABS
25.0000 mg | ORAL_TABLET | Freq: Every day | ORAL | Status: DC
  Administered 2016-07-06 – 2016-07-08 (×3): 25 mg via ORAL
  Filled 2016-07-05 (×3): qty 1

## 2016-07-05 NOTE — ED Provider Notes (Signed)
[facility]ERGENCY DEPT Provider Note   CSN: [id number] Arrival date & time: 07/05/16  1057     History   Chief Complaint Chief Complaint  Patient presents with  . Cough  . Chest Pain    Rib Cage    HPI Mark Baker is a 35 y.o. male.  Patient with history of asthma, nonischemic cardiomyopathy, chronic systolic and diastolic heart failure with EF of 20-25% in October 2015, sleep apnea -- presents with c/o L lateral lower rib cage pain. Patient has had a cough for several weeks and was evaluated in the ED for the same on 06/15/16. The cough has been persistent. For the past 3 or 4 days patient has had pain in the left chest with coughing only. Yesterday this became worse and more persistent. No fevers or hemoptysis. Patient has shortness of breath at baseline and does not feel that this is appreciably changed. He states that his legs do not feel more swollen than usual. He is typically orthopneic, but not more often than usual. No history of DVT or PE. Complaint today is his left sided rib pain, not his breathing.  Patient admits to running out of his Lasix due to some issue with his insurance. He has decreased his dose over the past several days. He took his last tablet this morning. Patient typically takes 160 mg a day, however has been taking between 40 and 80 over the past several days.      Past Medical History:  Diagnosis Date  . Asthma    childhood asthma no exacerbation since age 9   . Chronic combined systolic and diastolic CHF (congestive heart failure) (HCC)    a) ECHO (11/2012): EF 40-45%, RV fx difficult to see, nl size b) ECHO (05/2014): EF 20-25%, grade 2 DD, RV mildly dilated and sys fx mod reduced  . Hypertension   . NICM (nonischemic cardiomyopathy) (HCC)    a) (11/2012): normal coronaries  . OSA (obstructive sleep apnea)     Patient Active Problem List   Diagnosis Date Noted  . Chronic systolic heart failure (HCC) 06/03/2014  . Hyperlipidemia 11/25/2012  .  Prolonged QT interval 11/24/2012  . Healthcare maintenance 09/03/2012  . HTN (hypertension) 02/19/2012  . Acute on chronic combined systolic and diastolic CHF (congestive heart failure) (HCC) 02/12/2012  . Sleep apnea 02/10/2012  . Morbid obesity (HCC) 02/10/2012    Past Surgical History:  Procedure Laterality Date  . ANTERIOR CRUCIATE LIGAMENT REPAIR     right  . LEFT HEART CATHETERIZATION WITH CORONARY ANGIOGRAM N/A 11/24/2012   Procedure: LEFT HEART CATHETERIZATION WITH CORONARY ANGIOGRAM;  Surgeon: Peter M Swaziland, MD;  Location: [facility] CATH LAB;  Service: Cardiovascular;  Laterality: N/A;       Home Medications    Prior to Admission medications   Medication Sig Start Date End Date Taking? Authorizing Provider  atorvastatin (LIPITOR) 40 MG tablet Take 1 tablet (40 mg total) by mouth daily. Patient not taking: Reported on 06/15/2016 01/18/15   Courtney Paris, MD  atorvastatin (LIPITOR) 40 MG tablet Take 1 tablet (40 mg total) by mouth daily. 06/15/16   Hannah Muthersbaugh, PA-C  benzonatate (TESSALON) 100 MG capsule Take 1 capsule (100 mg total) by mouth every 8 (eight) hours. 06/15/16   Hannah Muthersbaugh, PA-C  digoxin (LANOXIN) 0.25 MG tablet Take 1 tablet (250 mcg total) by mouth daily. Patient not taking: Reported on 06/15/2016 01/16/15   Courtney Paris, MD  fluticasone [facility]) 50 MCG/ACT nasal spray Place 2 sprays  into both nostrils daily. 06/15/16   Hannah Muthersbaugh, PA-C  furosemide (LASIX) 40 MG tablet Take 1 tablet (40 mg total) by mouth 2 (two) times daily. 01/16/15   Courtney Paris, MD  hydrALAZINE (APRESOLINE) 25 MG tablet Take 1 tablet (25 mg total) by mouth 3 (three) times daily. Patient not taking: Reported on 06/15/2016 01/16/15   Courtney Paris, MD  isosorbide mononitrate (IMDUR) 30 MG 24 hr tablet Take 1 tablet (30 mg total) by mouth daily. Patient not taking: Reported on 06/15/2016 01/18/15   Courtney Paris, MD  isosorbide-hydrALAZINE (BIDIL) 20-37.5 MG tablet Take 1 tablet by mouth 3  (three) times daily.    Historical Provider, MD  lisinopril (PRINIVIL,ZESTRIL) 40 MG tablet Take 1 tablet (40 mg total) by mouth daily. 01/16/15   Courtney Paris, MD  lisinopril (PRINIVIL,ZESTRIL) 40 MG tablet Take 1 tablet (40 mg total) by mouth daily. 06/15/16   Hannah Muthersbaugh, PA-C  metoprolol succinate (TOPROL-XL) 50 MG 24 hr tablet TAKE 1 TABLET BY MOUTH AT BEDTIME WITH OR IMMEDIATELY FOLLOWING A MEAL 06/19/16   Tasrif Ahmed, MD  potassium chloride SA (K-DUR,KLOR-CON) 20 MEQ tablet Take 1 tablet (20 mEq total) by mouth 2 (two) times daily. Patient not taking: Reported on 06/15/2016 05/27/14   Denton Brick, MD  spironolactone (ALDACTONE) 25 MG tablet Take 1 tablet (25 mg total) by mouth daily. 01/16/15   Courtney Paris, MD  spironolactone (ALDACTONE) 25 MG tablet TAKE 1 TABLET BY MOUTH DAILY. NEEDS OFFICE VISIT Patient not taking: Reported on 06/15/2016 08/22/15   Dolores Patty, MD    Family History Family History  Problem Relation Age of Onset  . Hypertension Father   . Heart failure Father     paternal side of family   . Hypertension Sister   . Stroke Maternal Grandmother   . Hypertension      maternal side of family     Social History Social History  Substance Use Topics  . Smoking status: Never Smoker  . Smokeless tobacco: Never Used  . Alcohol use 0.0 oz/week     Comment: seldom.     Allergies   Aspirin   Review of Systems Review of Systems  Constitutional: Negative for diaphoresis and fever.  HENT: Negative for rhinorrhea and sore throat.   Eyes: Negative for redness.  Respiratory: Positive for cough and shortness of breath (not worse than baseline per patient).   Cardiovascular: Positive for chest pain and leg swelling (baseline). Negative for palpitations.  Gastrointestinal: Negative for abdominal pain, diarrhea, nausea and vomiting.  Genitourinary: Negative for dysuria.  Musculoskeletal: Negative for back pain, myalgias and neck pain.  Skin: Negative for  rash.  Neurological: Negative for syncope, light-headedness and headaches.  Psychiatric/Behavioral: The patient is not nervous/anxious.      Physical Exam Updated Vital Signs BP (!) 158/110 (BP Location: Left Arm)   Pulse 110   Temp 98.3 F (36.8 C) (Oral)   Resp 16   Ht 6\' 3"  (1.905 m)   Wt (!) 237.7 kg   SpO2 94%   BMI 65.50 kg/m   Physical Exam  Constitutional: He appears well-developed and well-nourished.  HENT:  Head: Normocephalic and atraumatic.  Mouth/Throat: Mucous membranes are normal. Mucous membranes are not dry.  Eyes: Conjunctivae are normal. Right eye exhibits no discharge. Left eye exhibits no discharge.  Neck: Trachea normal and normal range of motion. Neck supple. Normal carotid pulses and no JVD present. No muscular tenderness present. Carotid bruit is  not present. No tracheal deviation present.  Cardiovascular: Normal rate, regular rhythm, S1 normal, S2 normal, normal heart sounds and intact distal pulses.  Exam reveals no distant heart sounds and no decreased pulses.   No murmur heard. Pulmonary/Chest: Effort normal and breath sounds normal. No respiratory distress. He has no wheezes. He exhibits no tenderness.  L lower rib pain is not reproducible with palpation.   Abdominal: Soft. Normal aorta and bowel sounds are normal. There is no tenderness. There is no rebound and no guarding.  Musculoskeletal: He exhibits edema (bilateral LE, symmetric, no redness/warmth).  Neurological: He is alert.  Skin: Skin is warm and dry. He is not diaphoretic. No cyanosis. No pallor.  Psychiatric: He has a normal mood and affect.  Nursing note and vitals reviewed.    ED Treatments / Results  Labs (all labs ordered are listed, but only abnormal results are displayed) Labs Reviewed  BASIC METABOLIC PANEL - Abnormal; Notable for the following:       Result Value   Glucose, Bld 131 (*)    Creatinine, Ser 1.51 (*)    GFR calc non Af Amer 58 (*)    All other components  within normal limits  BRAIN NATRIURETIC PEPTIDE - Abnormal; Notable for the following:    B Natriuretic Peptide 327.3 (*)    All other components within normal limits  CBC WITH DIFFERENTIAL/PLATELET  D-DIMER, QUANTITATIVE (NOT AT [facility])  I-STAT TROPOININ, ED    EKG  EKG Interpretation  Date/Time:  Friday July 05 2016 11:30:30 EST Ventricular Rate:  100 PR Interval:  156 QRS Duration: 104 QT Interval:  378 QTC Calculation: 487 R Axis:   10 Text Interpretation:  Normal sinus rhythm Right atrial enlargement Left ventricular hypertrophy with repolarization abnormality , increased since last tracing Prolonged QT Abnormal ECG Confirmed by KNAPP  MD-J, JON ([id number]) on 07/05/2016 11:48:37 AM       Radiology Dg Ribs Unilateral W/chest Left  Result Date: 07/05/2016 CLINICAL DATA:  Cough. EXAM: LEFT RIBS AND CHEST - 3+ VIEW COMPARISON:  06/15/2016 . FINDINGS: Cardiomegaly with pulmonary vascular prominence and bilateral interstitial prominence consistent congestive heart failure. No pleural effusion or pneumothorax. No focal bony abnormality identified.  No evidence of fracture. IMPRESSION: 1. Congestive heart failure with bilateral pulmonary interstitial edema. Similar findings noted on prior exam. 2. No focal bony abnormality identified. No evidence of displaced rib fracture or pneumothorax. Electronically Signed   By: Maisie Fus  Register   On: 07/05/2016 12:19    Procedures Procedures (including critical care time)  Medications Ordered in ED Medications  furosemide (LASIX) tablet 80 mg (80 mg Oral Given 07/05/16 1702)  oxyCODONE-acetaminophen (PERCOCET/ROXICET) 5-325 MG per tablet 1 tablet (1 tablet Oral Given 07/05/16 1905)     Initial Impression / Assessment and Plan / ED Course  I have reviewed the triage vital signs and the nursing notes.  Pertinent labs & imaging results that were available during my care of the patient were reviewed by me and considered in my medical decision  making (see chart for details).  Clinical Course    Patient seen and examined. Work-up initiated. Medications ordered.   Vital signs reviewed and are as follows: BP (!) 158/110 (BP Location: Left Arm)   Pulse 110   Temp 98.3 F (36.8 C) (Oral)   Resp 16   Ht 6\' 3"  (1.905 m)   Wt (!) 237.7 kg   SpO2 94%   BMI 65.50 kg/m   Patient with desaturation with ambulation. +  BNP, interstitial edema on CXR. Patient agrees to inpatient treatment for CHF exacerbation.   7:50 PM Spoke with IMTS (Dr. Gwynn BurlyAndrew Wallace) who will admit.   BP 128/69 (BP Location: Right Arm)   Pulse 104   Temp 99 F (37.2 C) (Oral)   Resp 15   Ht 6\' 3"  (1.905 m)   Wt (!) 237.7 kg   SpO2 95%   BMI 65.50 kg/m     Final Clinical Impressions(s) / ED Diagnoses   Final diagnoses:  Acute on chronic congestive heart failure, unspecified congestive heart failure type (HCC)   Admit, CHF exacerbation.   New Prescriptions New Prescriptions   No medications on file     Renne CriglerJoshua Shellia Hartl, PA-C 07/05/16 1952    Lyndal Pulleyaniel Knott, MD 07/06/16 [phone]

## 2016-07-05 NOTE — ED Notes (Signed)
REPAGED INTERNAL MEDS TO [facility] @[id number] 

## 2016-07-05 NOTE — Progress Notes (Signed)
Patient transferred from ED, walked in room with visitor. Patient settled in, CMD called. Physicians at bedside to obtain patient history.

## 2016-07-05 NOTE — H&P (Signed)
Date: 07/05/2016               Patient Name:  Mark Baker MRN: [id number]  DOB: 03/22/1981 Age / Sex: 35 y.o., male   PCP: Dr. Abigail Miyamoto with [facility], No longer IM Clinic patient x 1 yr         Medical Service: Internal Medicine Teaching Service         Attending Physician: Dr. Inez Catalina, MD    First Contact: Dr. Nyra Market, MD Pager: [phone]  Second Contact: Dr. Deneise Lever, MD Pager: [phone]       After Hours (After 5p/  First Contact Pager: [phone]  weekends / holidays): Second Contact Pager: [phone]   Chief Complaint: cough, progressive dyspnea on exertion.   History of Present Illness:  This is a 35 y/o male with Mhx significant for morbid obesity (BMI 60%+), chronic systolic and diastolic CHF (EF 08/270 EF 20-25%,  grade 2 diastolic dysfunction), OSA and HTN who presents for evaluation of worsening cough and medication non-compliance. Pt reports gradually progressive DOE x 2 weeks and also Lasix non-compliance x 1 week. He is prescribed Lasix PO 160 mg daily however has been taking only 40 mg daily as he was running out of his prescription. Also endorses a 3 day history of left-sided rib cage pain which began during a cough. This sharp pain is only present during coughing and deep inspiration. He denies any worsening leg swelling, abdominal distention, fevers, chills, productive cough, N/V, abdominal pain, chest pain, diarrhea, constipation or dysuria.   In ED VSS (98.3*, pulse 104, respirations 18 and was saturating 94% on RA, BP 128/69 ). CMP + CBC, Trop, D-dimer WNL. BNP 327. CXR shows CHF with mild BL pulmonary interstitial edema, similar to prior 11/4. Patient does not know his dry weight, current weight 522lbs, 516lbs 2 weeks ago however weight 472 1.5 years ago. He was given PO Lasix 80 mg and Percocet 5-325. While ambulating, the patient desaturated to 92-86% prompting admission for CHF exacerbation.    Home Medications:  Lasix 80mg  PO  BID Lisinopril 40mg  PO daily Atorvastatin 40mg  PO daily Toprol XL 50mg  PO daily  Allergies: Allergies as of 07/05/2016 - Review Complete 07/05/2016  Allergen Reaction Noted  . Aspirin Hives, Itching, and Rash 10/21/2012   Past Medical History:  Diagnosis Date  . Asthma    childhood asthma no exacerbation since age 1   . Chronic combined systolic and diastolic CHF (congestive heart failure) (HCC)    a) ECHO (11/2012): EF 40-45%, RV fx difficult to see, nl size b) ECHO (05/2014): EF 20-25%, grade 2 DD, RV mildly dilated and sys fx mod reduced  . Hypertension   . NICM (nonischemic cardiomyopathy) (HCC)    a) (11/2012): normal coronaries  . OSA (obstructive sleep apnea)    Family History:  Father: HTN, HF Sister: HTN MGM: CVA  Social History: Never smoked. Occasional alcohol use. Denied any drug use. Lives at home with wife who appears supportive and involved in his medical care. 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   Review of Systems: A complete ROS was negative except as per HPI.  Physical Exam: Blood pressure 128/69, pulse 104, temperature 99 F (37.2 C), temperature source Oral, resp. rate 15, height 6\' 3"  (1.905 m), weight (!) 524 lb (237.7 kg), SpO2 95 %. General: Morbidly obese african [place] male resting comfortably sitting upright on the edge of the bed. In no acute distress. Wife present  at bedside.  HENT: Normocephalic and atraumatic. Oropharynx clear, mucous membranes moist. EOMI.  Cardiovascular: Difficult to appreciate secondary to body habitus however regular rate and rhythm. No appreciated murmurs or rubs.  Pulmonary: Diminished breath sounds BL, likely secondary to body habitus. Otherwise CTA BL. No wheezing, crackles or rhonchi. Breathing unlabored. No tenderness elicited with palpation of left lateral rib cage. Abdomen: Obese abdomen. Soft, non-tender and non-distended. No guarding or rigidity. +Bowel sounds Extremities: Symmetric peripheral  edema of BL LE, 1+. No erythema, warmth or suspicious lesions. Cutaneous changes consistent with chronic venous insufficiency.  Skin: warm, dry. No diaphoresis or cyanosis appreciated.  Neuro: Alert, oriented x3. Strength grossly intact. Ambulation without ataxia. Psych: Pleasant. Normal mood, normal affect. Speech clear and directed.   EKG: NSR. Right atrial enlargement. LVH. Prolonged QT (487)  CXR: CHF with mild BL pulmonary interstitial edema, similar to prior CXR 11/4.  CMP Latest Ref Rng & Units 07/06/2016 07/05/2016 06/15/2016  Glucose 65 - 99 mg/dL 161(W) 960(A) 540(J)  BUN 6 - 20 mg/dL 15 16 16   Creatinine 0.61 - 1.24 mg/dL 8.11(B) 1.47(W) 2.95(A)  Sodium 135 - 145 mmol/L 140 140 137  Potassium 3.5 - 5.1 mmol/L 4.0 4.2 4.1  Chloride 101 - 111 mmol/L 104 103 97(L)  CO2 22 - 32 mmol/L 28 30 31   Calcium 8.9 - 10.3 mg/dL 9.2 9.2 9.2  Total Protein 6.5 - 8.1 g/dL 6.9 - 7.2  Total Bilirubin 0.3 - 1.2 mg/dL 2.1(H) - 1.4(H)  Alkaline Phos 38 - 126 U/L 39 - 51  AST 15 - 41 U/L 20 - 28  ALT 17 - 63 U/L 21 - 22   CBC Latest Ref Rng & Units 07/05/2016 06/15/2016 05/22/2014  WBC 4.0 - 10.5 K/uL 8.3 10.4 7.5  Hemoglobin 13.0 - 17.0 g/dL 08.6 57.8 46.9  Hematocrit 39.0 - 52.0 % 45.2 44.9 45.2  Platelets 150 - 400 K/uL 214 277 256    Assessment & Plan by Problem: Active Problems:   Sleep apnea   Acute on chronic combined systolic and diastolic CHF (congestive heart failure) (HCC)   HTN (hypertension)   Congestive heart failure (CHF) (HCC)   Morbid obesity with body mass index (BMI) of 60.0 to 69.9 in adult [facility])   CHF exacerbation (HCC)   CKD (chronic kidney disease) stage 2, GFR 60-89 ml/min  Acute on Chronic Combined Systolic and Diastolic CHF Reported 5+ year history of CHF. Most recent ECHO 05/2014 showed EF 20-25%, mild LVH, severely dilated left ventricle and grade 2 diastolic dysfunction. Does not follow with HF/cardiology as an outpatient. Played college football for [employer]  as defensive tackle. Diagnosed with NICM in 2014 in setting of severe HTN - cath at that time normal. Pt reports he typically takes lasix 160 mg daily however has been taking 40 mg for the past several days as he was running out. Reports his leg swelling and SOB are at baseline however endorses progressive DOE x several weeks, worsening acutely over the past several days. BNP mildly elevated at 327. Pt does have some peripheral pitting edema however difficult to fully appreciate the degree. Pt not aware of his dry weight, most recent weight ~3 weeks ago 516 lbs and denied CHF exacerbation at that time. Will treat as CHF exacerbation with IV Lasix 160 mg.  -ECHO ordered for f/u, was recommended in 2015 - if EF <30% will need ref for ICD placement per HF team in 2015.  -IV Lasix 80mg  BID  -Ted Hose -Daily  weights, I&O's, HH diet with fluid restriction -Continue Spironolactone 25mg  -Continue Toprol 50mg  -Repeat CXR in AM  -CBC and BMET in AM  HTN Stable at this time, will continue to monitor as volume status improves.  -Continue Lisinopril 40mg   CKD Stage 2 Cr at baseline (1.5). Will monitor with diuresis.   OSA Does not wear CPAP at home however has been fitted. Reports difficulty getting insurance to pay. Instead sleeps kneeling over the edge of the bed.   Morbid Obesity, BMI >60%.  On Atorvastatin 40mg , denied hx of HLD or DM.  -Lipid panel showed Total cholesterol of 185, LDL 109, TG 229, HDL 30.  -Continue Statin  Code Status: FULL CODE DVT Prophylaxis: Lovenox dosed for weight Diet: HH, fluid restriction <122900mL IVF: normal saline lock  Dispo: Admit patient to Observation with expected length of stay less than 2 midnights.  SignedNoemi Chapel: Graycen Sadlon, DO 07/05/2016, 7:54 PM  Pager: [phone]

## 2016-07-05 NOTE — ED Notes (Signed)
SpO2 before ambulation: 92, pulse:107 SpO2 during ambulation: 92-86... Stayed at 91 for the first half of his walk, got as low as 86 when almost half-way finished walking, then stayed at 89 near the end of walk. Pulse: 56-45  Ambulated from hallway in Pod A to fast rack circle and back to pod A hallway.

## 2016-07-05 NOTE — ED Notes (Addendum)
PT refused wheelchair 

## 2016-07-05 NOTE — ED Notes (Signed)
Report received from Nikki K RN; care of patient assumed at this time 

## 2016-07-05 NOTE — ED Triage Notes (Addendum)
Pt reports recent bronchitis. He reports he coughed last night and felt a "pop" on the left side of his ribs. He reports he now has a pain with deep breathing and coughing. Pt denies any pain currently.

## 2016-07-06 ENCOUNTER — Inpatient Hospital Stay (HOSPITAL_COMMUNITY): Payer: Managed Care, Other (non HMO)

## 2016-07-06 LAB — COMPREHENSIVE METABOLIC PANEL
ALK PHOS: 39 U/L (ref 38–126)
ALT: 21 U/L (ref 17–63)
AST: 20 U/L (ref 15–41)
Albumin: 3.8 g/dL (ref 3.5–5.0)
Anion gap: 8 (ref 5–15)
BILIRUBIN TOTAL: 1.6 mg/dL — AB (ref 0.3–1.2)
BUN: 15 mg/dL (ref 6–20)
CALCIUM: 9.2 mg/dL (ref 8.9–10.3)
CO2: 28 mmol/L (ref 22–32)
CREATININE: 1.46 mg/dL — AB (ref 0.61–1.24)
Chloride: 104 mmol/L (ref 101–111)
GFR calc Af Amer: >60 mL/min (ref >60)
GFR calc non Af Amer: >60 mL/min (ref >60)
Glucose, Bld: 106 mg/dL — ABNORMAL HIGH (ref 65–99)
Potassium: 4 mmol/L (ref 3.5–5.1)
SODIUM: 140 mmol/L (ref 135–145)
TOTAL PROTEIN: 6.9 g/dL (ref 6.5–8.1)

## 2016-07-06 LAB — LIPID PANEL
CHOL/HDL RATIO: 6.2 ratio
CHOLESTEROL: 185 mg/dL (ref 0–200)
HDL: 30 mg/dL — AB (ref >40)
LDL Cholesterol: 109 mg/dL — ABNORMAL HIGH (ref 0–99)
TRIGLYCERIDES: 229 mg/dL — AB (ref <150)
VLDL: 46 mg/dL — AB (ref 0–40)

## 2016-07-06 LAB — HIV ANTIBODY (ROUTINE TESTING W REFLEX): HIV Screen 4th Generation wRfx: NONREACTIVE

## 2016-07-06 MED ORDER — HYDROCODONE-ACETAMINOPHEN 10-325 MG PO TABS
1.0000 | ORAL_TABLET | Freq: Four times a day (QID) | ORAL | Status: DC | PRN
  Administered 2016-07-06: 1 via ORAL
  Filled 2016-07-06 (×2): qty 1

## 2016-07-06 MED ORDER — FUROSEMIDE 10 MG/ML IJ SOLN
120.0000 mg | Freq: Two times a day (BID) | INTRAVENOUS | Status: DC
  Administered 2016-07-06 – 2016-07-08 (×4): 120 mg via INTRAVENOUS
  Filled 2016-07-06 (×5): qty 12

## 2016-07-06 NOTE — Progress Notes (Signed)
   Patient admitted for CHF exacerbation by the internal medicine residency program. Patient is followed by a [facility] physicians in the outpatient setting. Tried hospice services will assume care of patient on 07/07/2016 if patient remains hospitalized at that time.  Shelly Flatten, MD [facility] 07/06/2016, 10:38 AM

## 2016-07-06 NOTE — Progress Notes (Signed)
   Subjective:  No acute events overnight. Feels less dyspneic says IV diuresis is working.  Denies n/v/abd pain. Continues to have left rib pain.   Objective:  Vital signs in last 24 hours: Vitals:   07/05/16 2006 07/05/16 2055 07/06/16 0738 07/06/16 1044  BP: (!) 164/103 (!) 141/104 (!) 145/106 (!) 135/105  Pulse: 107 (!) 103 98 (!) 102  Resp: 18 18 19    Temp:  99.1 F (37.3 C) 97.4 F (36.3 C)   TempSrc:  Oral Oral   SpO2: 91% 92% 90% 95%  Weight:  (!) 521 lb 8 oz (236.6 kg) (!) 522 lb 9.6 oz (237 kg)   Height:  6\' 3"  (1.905 m)     General: Vital signs reviewed. morbidly obese, in NAD Cardiovascular: regular rate, rhythm, no murmur appreciated- distant heart sounds.  Pulmonary/Chest: diminished breath sounds BL. Difficult to appreciate given body habitus but some crackles. Abdominal: obese, +BS Extremities: difficult to assess edema due to body habitus, but at least 1+ pitting edema b/l Skin: Warm, dry and intact.     Assessment/Plan:  Principal Problem:   CHF exacerbation (HCC) Active Problems:   Sleep apnea   Acute on chronic combined systolic and diastolic CHF (congestive heart failure) (HCC)   HTN (hypertension)   Congestive heart failure (CHF) (HCC)   Morbid obesity with body mass index (BMI) of 60.0 to 69.9 in adult [facility])   CKD (chronic kidney disease) stage 2, GFR 60-89 ml/min  Acute on chronic combines CHF: Reported 5+ year history of CHF. Most recent ECHO 05/2014 showed EF 20-25%, mild LVH, severely dilated left ventricle and grade 2 diastolic dysfunction. Does not follow with HF/cardiology as an outpatient.Diagnosed with NICM in 2014 in setting of severe HTN - cath at that time normal. Pt reports he typically takes lasix 160 mg daily however has been taking 40 mg for the past several days as he was running out. Reports his leg swelling and SOB are at baseline however endorses progressive DOE x several weeks, worsening acutely over the past several days. BNP  mildly elevated at 327. Pt not aware of his dry weight, most recent weight ~3 weeks ago 516 lbs and denied CHF exacerbation at that time. Overnight has diuresed well on IV lasix and net output 2300 mL  -[facility]ed with [facility] will take over tomorrow. They recommended increasing lasix to 120 mg BID IV. -follow up echo -daily weights, I&O and fluid restriction -HH diet -continue aldactone and toprol. -daily BMETs  Rib pain: -started vicoden -voltaren gel 4 times a day -can consider checking for a rash in 1-2 days in that area, and rib imaging.   HTN: normotensive -continue lisinopril 40 mg  CKD 2- Daily BMETs while IV diuresis  OSA: -recommend pt to have CPAP outpatient.   Morbid obesity -recommend outpatient eval for bariatric surgery given his young age which will alleviate many of his symptoms, and may even help the heart failure  -PT OT   Code Status: FULL CODE DVT Prophylaxis: Lovenox dosed for weight Diet: HH, fluid restriction <12109mL IVF: normal saline lock    Dispo: Anticipated discharge in approximately 2 day(s).   Deneise Lever, MD 07/06/2016, 11:37 AM

## 2016-07-06 NOTE — Progress Notes (Signed)
Patients most recent blood pressures were 164/103 and 141/104. Physician notified.

## 2016-07-07 ENCOUNTER — Inpatient Hospital Stay (HOSPITAL_COMMUNITY): Payer: Managed Care, Other (non HMO)

## 2016-07-07 DIAGNOSIS — N182 Chronic kidney disease, stage 2 (mild): Secondary | ICD-10-CM

## 2016-07-07 DIAGNOSIS — I509 Heart failure, unspecified: Secondary | ICD-10-CM

## 2016-07-07 DIAGNOSIS — I10 Essential (primary) hypertension: Secondary | ICD-10-CM

## 2016-07-07 DIAGNOSIS — G4733 Obstructive sleep apnea (adult) (pediatric): Secondary | ICD-10-CM

## 2016-07-07 DIAGNOSIS — Z6841 Body Mass Index (BMI) 40.0 and over, adult: Secondary | ICD-10-CM

## 2016-07-07 LAB — BASIC METABOLIC PANEL
Anion gap: 11 (ref 5–15)
BUN: 16 mg/dL (ref 6–20)
CALCIUM: 9.3 mg/dL (ref 8.9–10.3)
CHLORIDE: 99 mmol/L — AB (ref 101–111)
CO2: 30 mmol/L (ref 22–32)
CREATININE: 1.37 mg/dL — AB (ref 0.61–1.24)
Glucose, Bld: 106 mg/dL — ABNORMAL HIGH (ref 65–99)
Potassium: 4 mmol/L (ref 3.5–5.1)
SODIUM: 140 mmol/L (ref 135–145)

## 2016-07-07 LAB — HEMOGLOBIN A1C
HEMOGLOBIN A1C: 5.6 % (ref 4.8–5.6)
MEAN PLASMA GLUCOSE: 114 mg/dL

## 2016-07-07 LAB — ECHOCARDIOGRAM COMPLETE
HEIGHTINCHES: 75 in
Weight: 8177.6 oz

## 2016-07-07 MED ORDER — PERFLUTREN LIPID MICROSPHERE
INTRAVENOUS | Status: AC
  Administered 2016-07-07: 2 mL
  Filled 2016-07-07: qty 10

## 2016-07-07 MED ORDER — PERFLUTREN LIPID MICROSPHERE
1.0000 mL | INTRAVENOUS | Status: AC | PRN
  Filled 2016-07-07: qty 10

## 2016-07-07 NOTE — Progress Notes (Signed)
PROGRESS NOTE  Mark Baker SXJ:[id number] DOB: 01/27/1981 DOA: 07/05/2016 PCP: Irving Copas, MD  Brief History:  35 year old male with a history of hypertension, systolic and diastolic CHF, obstructive sleep apnea, morbid obesity presented with two-week history of progressive dyspnea on exertion and orthopnea. The patient also was complaining of worsening lower extremity edema. The patient had recent bronchitis with nonproductive cough, and he  Felt he pulled a muscle and began developing left rib pain. He denies any fevers, chills, nausea, vomiting, diarrhea, headache, neck pain.  He states that for the past week he was running low on his furosemide pills and was trying to stretch out his pills by decreasing the number he was taking. Normally, the patient takes 80 mg twice a day, but for the past week he has only been taking 40 mg daily. Prior to this, the patient stated that he was compliant with all his medications, but he was not very compliant with fluid restriction or his diet. Upon presentation, chest x-ray revealed pulmonary vascular congestion with normal d-dimer. The patient was started on intravenous furosemide with good clinical effect. Assessment/Plan: Acute on chronic systolic and diastolic CHF -Continue furosemide 120 mg IV twice a day -Daily weights -Fluid restriction -Continue metoprolol succinate -Continue spironolactone and lisinopril -He appears to have poor insight into his medical situation and appears to have many excuses for his current condition -personally reviewed CXR--pulm vasc congestion -suspect degree of cor pulmonale -Echo  CKD stage II -Baseline creatinine 1.3-1.5 -monitor with diuresis  Hypertension -Continue lisinopril, metoprolol succinate, spironolactone  Left-sided rib pain -Symptomatic treatment -Chest x-ray negative for fracture -personally reviewed EKG--sinus with nonspecific ST- changes  Hyperlipidemia -continue  statin -LDL 109  Morbid Obesity (BMI>60) -lifestyle modification   Disposition Plan:   Home in 2-3 days  Family Communication:   Wife udpated at bedside--Total time spent 35 minutes.  Greater than 50% spent face to face counseling and coordinating care.   Consultants:  none  Code Status:  FULL   DVT Prophylaxis:  [place] Lovenox   Procedures: As Listed in Progress Note Above  Antibiotics: None    Subjective: He is breathing better today.  Able to lay flat this am.  Patient denies fevers, chills, headache, chest pain, dyspnea, nausea, vomiting, diarrhea, abdominal pain, dysuria, hematuria, hematochezia, and melena.   Objective: Vitals:   07/06/16 1157 07/06/16 1939 07/07/16 0036 07/07/16 0640  BP: (!) 140/91 (!) 150/95 (!) 131/95 (!) 134/95  Pulse: (!) 102 (!) 103 90 94  Resp: 18 20 18 18   Temp: 97.8 F (36.6 C) 97.7 F (36.5 C) 97.5 F (36.4 C) 97.7 F (36.5 C)  TempSrc: Oral Oral Oral Tympanic  SpO2: 95% 94% 95% 98%  Weight:    (!) 231.8 kg (511 lb 1.6 oz)  Height:        Intake/Output Summary (Last 24 hours) at 07/07/16 0954 Last data filed at 07/07/16 0900  Gross per 24 hour  Intake              460 ml  Output             3550 ml  Net            -3090 ml   Weight change: -0.635 kg (-1 lb 6.4 oz) Exam:   General:  Pt is alert, follows commands appropriately, not in acute distress  HEENT: No icterus, No thrush, No neck mass, [place]/AT  Cardiovascular: RRR, S1/S2, no rubs,  no gallops  Respiratory: bibasilar crackles, no wheeze  Abdomen: Soft/+BS, non tender, non distended, no guarding  Extremities: 2 + LE edema, No lymphangitis, No petechiae, No rashes, no synovitis   Data Reviewed: I have personally reviewed following labs and imaging studies Basic Metabolic Panel:  Recent Labs Lab 07/05/16 1549 07/06/16 0347 07/07/16 0514  NA 140 140 140  K 4.2 4.0 4.0  CL 103 104 99*  CO2 30 28 30   GLUCOSE 131* 106* 106*  BUN 16 15 16   CREATININE 1.51*  1.46* 1.37*  CALCIUM 9.2 9.2 9.3   Liver Function Tests:  Recent Labs Lab 07/06/16 0347  AST 20  ALT 21  ALKPHOS 39  BILITOT 1.6*  PROT 6.9  ALBUMIN 3.8   No results for input(s): LIPASE, AMYLASE in the last 168 hours. No results for input(s): AMMONIA in the last 168 hours. Coagulation Profile: No results for input(s): INR, PROTIME in the last 168 hours. CBC:  Recent Labs Lab 07/05/16 1549  WBC 8.3  NEUTROABS 4.7  HGB 14.3  HCT 45.2  MCV 92.1  PLT 214   Cardiac Enzymes: No results for input(s): CKTOTAL, CKMB, CKMBINDEX, TROPONINI in the last 168 hours. BNP: Invalid input(s): POCBNP CBG: No results for input(s): GLUCAP in the last 168 hours. HbA1C: No results for input(s): HGBA1C in the last 72 hours. Urine analysis:    Component Value Date/Time   COLORURINE YELLOW 11/08/2012 2055   APPEARANCEUR CLEAR 11/08/2012 2055   LABSPEC 1.024 11/08/2012 2055   PHURINE 6.0 11/08/2012 2055   GLUCOSEU NEGATIVE 11/08/2012 2055   HGBUR NEGATIVE 11/08/2012 2055   BILIRUBINUR NEGATIVE 11/08/2012 2055   KETONESUR NEGATIVE 11/08/2012 2055   PROTEINUR NEGATIVE 11/08/2012 2055   UROBILINOGEN 1.0 11/08/2012 2055   NITRITE NEGATIVE 11/08/2012 2055   LEUKOCYTESUR NEGATIVE 11/08/2012 2055   Sepsis Labs: @LABRCNTIP (procalcitonin:4,lacticidven:4) )No results found for this or any previous visit (from the past 240 hour(s)).   Scheduled Meds: . atorvastatin  40 mg Oral Daily  . diclofenac sodium  4 g Topical QID  . enoxaparin (LOVENOX) injection  0.5 mg/kg Subcutaneous Q24H  . furosemide  120 mg Intravenous BID  . lisinopril  40 mg Oral Daily  . metoprolol succinate  50 mg Oral Daily  . sodium chloride flush  3 mL Intravenous Q12H  . spironolactone  25 mg Oral Daily   Continuous Infusions:  Procedures/Studies: X-ray Chest Pa And Lateral  Result Date: 07/06/2016 CLINICAL DATA:  CHF EXAM: CHEST  2 VIEW COMPARISON:  07/05/2016 FINDINGS: Cardiac shadow remains enlarged.  Vascular congestion is again seen and stable. No sizable effusion is noted. No focal infiltrate is seen. IMPRESSION: Mild vascular congestion.  No new focal abnormality is seen. Electronically Signed   By: Alcide CleverMark  Lukens M.D.   On: 07/06/2016 08:30   Dg Chest 2 View  Result Date: 06/15/2016 CLINICAL DATA:  Cough for 2 weeks. History of congestive heart failure. EXAM: CHEST  2 VIEW COMPARISON:  Chest radiograph 05/21/2014 FINDINGS: Unchanged advanced cardiomegaly. Mild pulmonary edema. No focal airspace consolidation. No pneumothorax or sizable pleural effusion. IMPRESSION: Advanced cardiomegaly, unchanged, with mild pulmonary edema. Electronically Signed   By: Deatra RobinsonKevin  Herman M.D.   On: 06/15/2016 02:19   Dg Ribs Unilateral W/chest Left  Result Date: 07/05/2016 CLINICAL DATA:  Cough. EXAM: LEFT RIBS AND CHEST - 3+ VIEW COMPARISON:  06/15/2016 . FINDINGS: Cardiomegaly with pulmonary vascular prominence and bilateral interstitial prominence consistent congestive heart failure. No pleural effusion or pneumothorax. No focal bony abnormality identified.  No evidence of fracture. IMPRESSION: 1. Congestive heart failure with bilateral pulmonary interstitial edema. Similar findings noted on prior exam. 2. No focal bony abnormality identified. No evidence of displaced rib fracture or pneumothorax. Electronically Signed   By: Maisie Fus  Register   On: 07/05/2016 12:19    Teressa Mcglocklin, DO  [facility] Pager [phone]  If 7PM-7AM, please contact night-coverage [email or web address] Password [email or web address] 07/07/2016, 9:54 AM   LOS: 2 days

## 2016-07-07 NOTE — Progress Notes (Signed)
OT Cancellation Note  Patient Details Name: Mark Baker MRN: [id number] DOB: 10-Jun-1981   Cancelled Treatment:    Reason Eval/Treat Not Completed: OT screened, no needs identified, will sign off. Pt is at his baseline in ADL and mobility.   Evern Bio 07/07/2016, 3:34 PM  [phone]

## 2016-07-07 NOTE — Progress Notes (Signed)
  Echocardiogram 2D Echocardiogram with Definity has been performed.  Nolon Rod 07/07/2016, 3:00 PM

## 2016-07-07 NOTE — Progress Notes (Signed)
PT Cancellation Note  Patient Details Name: Mark Baker MRN: [id number] DOB: Sep 04, 1980   Cancelled Treatment:    Reason Eval/Treat Not Completed: PT screened, no needs identified, will sign off. Pt independent with mobility. Encouraged pt to amb in hallways while here.   Angelina Ok [place] 07/07/2016, 2:12 PM [employer] PT [phone]

## 2016-07-08 DIAGNOSIS — I5043 Acute on chronic combined systolic (congestive) and diastolic (congestive) heart failure: Secondary | ICD-10-CM

## 2016-07-08 MED ORDER — FUROSEMIDE 80 MG PO TABS: 80.0000 mg | ORAL_TABLET | Freq: Two times a day (BID) | ORAL | 0 refills | Status: DC

## 2016-07-08 MED ORDER — ATORVASTATIN CALCIUM 40 MG PO TABS: 40.0000 mg | ORAL_TABLET | Freq: Every day | ORAL | 0 refills | Status: DC

## 2016-07-08 MED ORDER — LOSARTAN POTASSIUM 100 MG PO TABS: 100.0000 mg | ORAL_TABLET | Freq: Every day | ORAL | 0 refills | Status: DC

## 2016-07-08 MED ORDER — SPIRONOLACTONE 25 MG PO TABS: 25.0000 mg | ORAL_TABLET | Freq: Every day | ORAL | 0 refills | Status: DC

## 2016-07-08 NOTE — Progress Notes (Signed)
Patient given discharge instructions and all questions answered.  Pt. Discharged with all belongings via wheelchair.   

## 2016-07-08 NOTE — Consult Note (Signed)
Advanced Heart Failure Team Consult Note  Referring Physician: Dr Tat  Primary Physician: Dr Abigail Miyamotohacker  Primary Cardiologist:  Dr Elease HashimotoNahser  Reason for Consultation: Acute/Chronic Heart Failure   HPI:   Mark Baker is a 35 yo male with a history of morbid obesity, chronic combined systolic/diastolic HF, NICM, HTN, asthma and OSA (does not wear CPAP). He was last seen in the HF clinic 2015.   Prior to admit he was cutting his home dose of lasix so he wouldn't run out. Does not weigh at home. Admitted with increased dyspnea and rib pain. Pertinent admission labs include: BNP 327, K 4.2, creatinine 1.51, Hgb 14.3, HIV NR, and WBC 8.3 CXR with bilateral pulmonary edema. Diuresing with IV lasix. Over diuresed 20 pounds.  Today he denies SOB. Wants to go home.   ECHO 07/07/2016: LVEF 20-25%. Moderately dilated RV. Grade II DD  LHC 2015 Normal Coronaries Review of Systems: [y] = yes, [ ]  = no   General: Weight gain [Y ]; Weight loss [ ] ; Anorexia [ ] ; Fatigue [ ] ; Fever [ ] ; Chills [ ] ; Weakness [ Y]  Cardiac: Chest pain/pressure [ ] ; Resting SOB [ ] ; Exertional SOB [ ] ; Orthopnea [ ] ; Pedal Edema [Y ]; Palpitations [ ] ; Syncope [ ] ; Presyncope [ ] ; Paroxysmal nocturnal dyspnea[ ]   Pulmonary: Cough [ ] ; Wheezing[ ] ; Hemoptysis[ ] ; Sputum [ ] ; Snoring [ ]   GI: Vomiting[ ] ; Dysphagia[ ] ; Melena[ ] ; Hematochezia [ ] ; Heartburn[ ] ; Abdominal pain [ ] ; Constipation [ ] ; Diarrhea [ ] ; BRBPR [ ]   GU: Hematuria[ ] ; Dysuria [ ] ; Nocturia[ ]   Vascular: Pain in legs with walking [ ] ; Pain in feet with lying flat [ ] ; Non-healing sores [ ] ; Stroke [ ] ; TIA [ ] ; Slurred speech [ ] ;  Neuro: Headaches[ ] ; Vertigo[ ] ; Seizures[ ] ; Paresthesias[ ] ;Blurred vision [ ] ; Diplopia [ ] ; Vision changes [ ]   Ortho/Skin: Arthritis [ ] ; Joint pain [ Y]; Muscle pain [ ] ; Joint swelling [ ] ; Back Pain [Y ]; Rash [ ]   Psych: Depression[ ] ; Anxiety[ ]   Heme: Bleeding problems [ ] ; Clotting disorders [ ] ; Anemia [ ]   Endocrine:  Diabetes [ ] ; Thyroid dysfunction[ ]   Home Medications Prior to Admission medications   Medication Sig Start Date End Date Taking? Authorizing Provider  furosemide (LASIX) 80 MG tablet Take 80 mg by mouth 2 (two) times daily.   Yes Historical Provider, MD  lisinopril (PRINIVIL,ZESTRIL) 40 MG tablet Take 1 tablet (40 mg total) by mouth daily. 01/16/15  Yes Courtney ParisEden W Jones, MD  metoprolol succinate (TOPROL-XL) 50 MG 24 hr tablet TAKE 1 TABLET BY MOUTH AT BEDTIME WITH OR IMMEDIATELY FOLLOWING A MEAL 06/19/16  Yes Tasrif Ahmed, MD  spironolactone (ALDACTONE) 25 MG tablet Take 1 tablet (25 mg total) by mouth daily. Patient taking differently: Take 25 mg by mouth 2 (two) times daily.  01/16/15  Yes Courtney ParisEden W Jones, MD  atorvastatin (LIPITOR) 40 MG tablet Take 1 tablet (40 mg total) by mouth daily. Patient not taking: Reported on 07/06/2016 06/15/16   Dahlia ClientHannah Muthersbaugh, PA-C  digoxin (LANOXIN) 0.25 MG tablet Take 1 tablet (250 mcg total) by mouth daily. Patient not taking: Reported on 07/06/2016 01/16/15   Courtney ParisEden W Jones, MD  furosemide (LASIX) 40 MG tablet Take 1 tablet (40 mg total) by mouth 2 (two) times daily. Patient not taking: Reported on 07/06/2016 01/16/15   Courtney ParisEden W Jones, MD  hydrALAZINE (APRESOLINE) 25 MG tablet Take 1 tablet (25 mg  total) by mouth 3 (three) times daily. Patient not taking: Reported on 07/06/2016 01/16/15   Courtney Paris, MD  isosorbide mononitrate (IMDUR) 30 MG 24 hr tablet Take 1 tablet (30 mg total) by mouth daily. Patient not taking: Reported on 07/06/2016 01/18/15   Courtney Paris, MD  isosorbide-hydrALAZINE (BIDIL) 20-37.5 MG tablet Take 1 tablet by mouth 3 (three) times daily.    Historical Provider, MD  potassium chloride SA (K-DUR,KLOR-CON) 20 MEQ tablet Take 1 tablet (20 mEq total) by mouth 2 (two) times daily. Patient not taking: Reported on 07/06/2016 05/27/14   Denton Brick, MD    Past Medical History: Past Medical History:  Diagnosis Date  . Asthma    childhood asthma no  exacerbation since age 76   . Chronic combined systolic and diastolic CHF (congestive heart failure) (HCC)    a) ECHO (11/2012): EF 40-45%, RV fx difficult to see, nl size b) ECHO (05/2014): EF 20-25%, grade 2 DD, RV mildly dilated and sys fx mod reduced  . Hypertension   . NICM (nonischemic cardiomyopathy) (HCC)    a) (11/2012): normal coronaries  . OSA (obstructive sleep apnea)     Past Surgical History: Past Surgical History:  Procedure Laterality Date  . ANTERIOR CRUCIATE LIGAMENT REPAIR     right  . LEFT HEART CATHETERIZATION WITH CORONARY ANGIOGRAM N/A 11/24/2012   Procedure: LEFT HEART CATHETERIZATION WITH CORONARY ANGIOGRAM;  Surgeon: Peter M Swaziland, MD;  Location: [facility] CATH LAB;  Service: Cardiovascular;  Laterality: N/A;    Family History: Family History  Problem Relation Age of Onset  . Hypertension Father   . Heart failure Father     paternal side of family   . Hypertension Sister   . Stroke Maternal Grandmother   . Hypertension      maternal side of family     Social History: Social History   Social History  . Marital status: Married    Spouse name: N/A  . Number of children: N/A  . Years of education: N/A   Social History Main Topics  . Smoking status: Never Smoker  . Smokeless tobacco: Never Used  . Alcohol use 0.0 oz/week     Comment: seldom.  . Drug use: No  . Sexual activity: Not Asked   Other Topics Concern  . None   Social History Narrative   Lives in [place] with wife of 4 years.  Works for [employer] at [employer].  1 son and 2 daughters.     Denies cigarettes. Drank 1 beer last night 11/22/12. Denies drugs           Allergies:  Allergies  Allergen Reactions  . Aspirin Hives, Itching and Rash    Objective:    Vital Signs:   Temp:  [97.6 F (36.4 C)-98.2 F (36.8 C)] 97.6 F (36.4 C) (11/27 0447) Pulse Rate:  [93-101] 93 (11/27 0447) Resp:  [18] 18 (11/27 0447) BP: (108-134)/(71-87) 129/87 (11/27 0447) SpO2:  [92 %-98 %] 92 % (11/27  0447) Weight:  [502 lb (227.7 kg)] 502 lb (227.7 kg) (11/27 0447) Last BM Date: 07/08/16  Weight change: Filed Weights   07/06/16 0738 07/07/16 0640 07/08/16 0447  Weight: (!) 522 lb 9.6 oz (237 kg) (!) 511 lb 1.6 oz (231.8 kg) (!) 502 lb (227.7 kg)    Intake/Output:   Intake/Output Summary (Last 24 hours) at 07/08/16 1121 Last data filed at 07/08/16 0947  Gross per 24 hour  Intake  785 ml  Output             4850 ml  Net            -4065 ml     Physical Exam: General:  Well appearing. No resp difficulty. Sitting in the chair.  HEENT: normal Neck: supple. JVP 6-7 . Carotids 2+ bilat; no bruits. No lymphadenopathy or thryomegaly appreciated. Cor: PMI nondisplaced. Regular rate & rhythm. No rubs, gallops or murmurs. Lungs: clear Abdomen: obese, soft, nontender, nondistended. No hepatosplenomegaly. No bruits or masses. Good bowel sounds. Extremities: no cyanosis, clubbing, rash, R and LLE trace edema Neuro: alert & orientedx3, cranial nerves grossly intact. moves all 4 extremities w/o difficulty. Affect pleasant  Telemetry: NSR 70s   Labs: Basic Metabolic Panel:  Recent Labs Lab 07/05/16 1549 07/06/16 0347 07/07/16 0514  NA 140 140 140  K 4.2 4.0 4.0  CL 103 104 99*  CO2 30 28 30   GLUCOSE 131* 106* 106*  BUN 16 15 16   CREATININE 1.51* 1.46* 1.37*  CALCIUM 9.2 9.2 9.3    Liver Function Tests:  Recent Labs Lab 07/06/16 0347  AST 20  ALT 21  ALKPHOS 39  BILITOT 1.6*  PROT 6.9  ALBUMIN 3.8   No results for input(s): LIPASE, AMYLASE in the last 168 hours. No results for input(s): AMMONIA in the last 168 hours.  CBC:  Recent Labs Lab 07/05/16 1549  WBC 8.3  NEUTROABS 4.7  HGB 14.3  HCT 45.2  MCV 92.1  PLT 214    Cardiac Enzymes: No results for input(s): CKTOTAL, CKMB, CKMBINDEX, TROPONINI in the last 168 hours.  BNP: BNP (last 3 results)  Recent Labs  07/05/16 1549  BNP 327.3*    ProBNP (last 3 results) No results for  input(s): PROBNP in the last 8760 hours.   CBG: No results for input(s): GLUCAP in the last 168 hours.  Coagulation Studies: No results for input(s): LABPROT, INR in the last 72 hours.  Other results: EKG: NSR 100 bpm QRS 104 ms  Imaging:  No results found.   Medications:     Current Medications: . atorvastatin  40 mg Oral Daily  . diclofenac sodium  4 g Topical QID  . enoxaparin (LOVENOX) injection  0.5 mg/kg Subcutaneous Q24H  . furosemide  120 mg Intravenous BID  . lisinopril  40 mg Oral Daily  . metoprolol succinate  50 mg Oral Daily  . sodium chloride flush  3 mL Intravenous Q12H  . spironolactone  25 mg Oral Daily     Infusions:    Assessment:  1. A/C Combined Systolic /Diastolic Heart Failure  2. Rib Pain 3. CKD Stage II- creatinine baseline 1.3-1.5  4. Morbid Obesity    Plan/Discussion:   Mr Golson is a 35 year old admitted with A/C combined systolic/diastolic heart failure. NYHA IV on admit.    NICM with normal coronaries noted LHC 2015. ECHO completed and shows EF 20-25% with grade II DD. He has been diuresed 20 pounds with IV lasix and appears dry. Stop IV lasix and transition back to lasix 80 mg po twice a day with an extra 80 mg lasix for 3 pound weight gain. Continue current dose to Toprol XL. Stop lisinopril and tomorrow switch to losartan 100 mg daily. Anticipate starting entresto as an outpatient. Continue spiro 25 mg daily.   HF follow up set up for next week. Plan to check BMET at that time.   HF meds for d/c  Lasix 80 mg  twice a day with an extra 80 mg for 3 pound weight gain Toprol XL 50 mg daily Losartan 100 mg daily --> start 07/09/16 Spiro 25 mg daily.      Length of Stay: 3  Amy Clegg NP-C  07/08/2016, 11:21 AM  Advanced Heart Failure Team Pager [phone] (M-F; 7a - 4p)  Please contact [facility] for night-coverage after hours (4p -7a ) and weekends on [email or web address]  Patient seen and examined with Tonye Becket, NP. We discussed  all aspects of the encounter. I agree with the assessment and plan as stated above.   Patient well known to [place] from previous HF visits but hasn't followed up in over 3 years. Admitted with recurrent HF due to cutting back lasix. Echo reviewed EF 20-25%. Overall doing well at home despite severe LV dysfunction.  Volume status now looks back to baseline after 20 pound diuresis. Can send home today. Change lisinopril to losartan 100 daily in preparation for switching to Entresto next week in HF Clinic.   Cainan Trull,MD 12:09 PM    Volume status

## 2016-07-08 NOTE — Discharge Summary (Signed)
Physician Discharge Summary  Mark Baker AVW:[id number] DOB: [birth year] DOA: 07/05/2016  PCP: Irving Copas, MD  Admit date: 07/05/2016 Discharge date: 07/08/2016  Admitted From: Home Disposition:  Home  Recommendations for Outpatient Follow-up:  1. Follow up with PCP in 1-2 weeks 2. Please obtain BMP in one week   Home Health: no Equipment/Devices: none  Discharge Condition: stable CODE STATUS: FULL Diet recommendation: Heart Healthy    Brief/Interim Summary: 35 year old male with a history of hypertension, systolic and diastolic CHF, obstructive sleep apnea, morbid obesity presented with two-week history of progressive dyspnea on exertion and orthopnea. The patient also was complaining of worsening lower extremity edema. The patient had recent bronchitis with nonproductive cough, and he  Felt he pulled a muscle and began developing left rib pain. He denies any fevers, chills, nausea, vomiting, diarrhea, headache, neck pain.  He states that for the past week he was running low on his furosemide pills and was trying to stretch out his pills by decreasing the number he was taking. Normally, the patient takes 80 mg twice a day, but for the past week he has only been taking 40 mg daily. Prior to this, the patient stated that he was compliant with all his medications, but he was not very compliant with fluid restriction or his diet. Upon presentation, chest x-ray revealed pulmonary vascular congestion with normal d-dimer. The patient was started on intravenous furosemide with good clinical effect.  Discharge Diagnoses:  Acute on chronic systolic and diastolic CHF -Continue furosemide 120 mg IV twice a day -Daily weights--NEG 14 lbs -NEG 9.7 L  -Fluid restriction -Continue metoprolol succinate -Continue spironolactone  -d/c lisinopril -start losartan in preparation for Entresto -Seen by CHF team -personally reviewed CXR--pulm vasc congestion -suspect degree of cor  pulmonale -Echo--EF 25-30%, grade 2 DD, moderate RV dilatation -d/c home with lasix 80 mg bid  CKD stage II -Baseline creatinine 1.3-1.5 -monitor with diuresis -Serum creatinine 1.37 on day of discharge  Hypertension -Continue  metoprolol succinate, spironolactone -d/c lisino -start losartan 100mg  daily in preparation for Entresto  Left-sided rib pain -Symptomatic treatment -Chest x-ray negative for fracture -personally reviewed EKG--sinus with nonspecific ST- changes  Hyperlipidemia -continue statin -LDL 109  Morbid Obesity (BMI>60) -lifestyle modification    Discharge Instructions  Discharge Instructions    Diet - low sodium heart healthy    Complete by:  As directed    Increase activity slowly    Complete by:  As directed        Medication List    STOP taking these medications   digoxin 0.25 MG tablet Commonly known as:  LANOXIN   hydrALAZINE 25 MG tablet Commonly known as:  APRESOLINE   isosorbide mononitrate 30 MG 24 hr tablet Commonly known as:  IMDUR   isosorbide-hydrALAZINE 20-37.5 MG tablet Commonly known as:  BIDIL   lisinopril 40 MG tablet Commonly known as:  PRINIVIL,ZESTRIL   potassium chloride SA 20 MEQ tablet Commonly known as:  K-DUR,KLOR-CON     TAKE these medications   atorvastatin 40 MG tablet Commonly known as:  LIPITOR Take 1 tablet (40 mg total) by mouth daily.   furosemide 80 MG tablet Commonly known as:  LASIX Take 1 tablet (80 mg total) by mouth 2 (two) times daily. What changed:  Another medication with the same name was removed. Continue taking this medication, and follow the directions you see here.   losartan 100 MG tablet Commonly known as:  COZAAR Take 1 tablet (100 mg total) by mouth daily.  metoprolol succinate 50 MG 24 hr tablet Commonly known as:  TOPROL-XL TAKE 1 TABLET BY MOUTH AT BEDTIME WITH OR IMMEDIATELY FOLLOWING A MEAL   spironolactone 25 MG tablet Commonly known as:  ALDACTONE Take 1  tablet (25 mg total) by mouth daily. What changed:  when to take this      Follow-up Information    Tonye Becket, NP Follow up on 07/15/2016.   Specialty:  Cardiology Why:  Heart Failure Clinic located on the 1st floor of [facility]. At 2:40 Garage Code [id number] Contact information: [address] [place] [address] [phone]          Allergies  Allergen Reactions  . Aspirin Hives, Itching and Rash    Consultations:  CHF team   Procedures/Studies: X-ray Chest Pa And Lateral  Result Date: 07/06/2016 CLINICAL DATA:  CHF EXAM: CHEST  2 VIEW COMPARISON:  07/05/2016 FINDINGS: Cardiac shadow remains enlarged. Vascular congestion is again seen and stable. No sizable effusion is noted. No focal infiltrate is seen. IMPRESSION: Mild vascular congestion.  No new focal abnormality is seen. Electronically Signed   By: Alcide Clever M.D.   On: 07/06/2016 08:30   Dg Chest 2 View  Result Date: 06/15/2016 CLINICAL DATA:  Cough for 2 weeks. History of congestive heart failure. EXAM: CHEST  2 VIEW COMPARISON:  Chest radiograph 05/21/2014 FINDINGS: Unchanged advanced cardiomegaly. Mild pulmonary edema. No focal airspace consolidation. No pneumothorax or sizable pleural effusion. IMPRESSION: Advanced cardiomegaly, unchanged, with mild pulmonary edema. Electronically Signed   By: Deatra Robinson M.D.   On: 06/15/2016 02:19   Dg Ribs Unilateral W/chest Left  Result Date: 07/05/2016 CLINICAL DATA:  Cough. EXAM: LEFT RIBS AND CHEST - 3+ VIEW COMPARISON:  06/15/2016 . FINDINGS: Cardiomegaly with pulmonary vascular prominence and bilateral interstitial prominence consistent congestive heart failure. No pleural effusion or pneumothorax. No focal bony abnormality identified.  No evidence of fracture. IMPRESSION: 1. Congestive heart failure with bilateral pulmonary interstitial edema. Similar findings noted on prior exam. 2. No focal bony abnormality identified. No evidence of displaced rib fracture or  pneumothorax. Electronically Signed   By: Maisie Fus  Register   On: 07/05/2016 12:19        Discharge Exam: Vitals:   07/07/16 2024 07/08/16 0447  BP: 134/84 129/87  Pulse: 100 93  Resp: 18 18  Temp: 98.2 F (36.8 C) 97.6 F (36.4 C)   Vitals:   07/07/16 0640 07/07/16 1153 07/07/16 2024 07/08/16 0447  BP: (!) 134/95 108/71 134/84 129/87  Pulse: 94 (!) 101 100 93  Resp: 18 18 18 18   Temp: 97.7 F (36.5 C) 97.9 F (36.6 C) 98.2 F (36.8 C) 97.6 F (36.4 C)  TempSrc: Tympanic Oral Oral Oral  SpO2: 98% 98% 94% 92%  Weight: (!) 231.8 kg (511 lb 1.6 oz)   (!) 227.7 kg (502 lb)  Height:        General: Pt is alert, awake, not in acute distress Cardiovascular: RRR, S1/S2 +, no rubs, no gallops Respiratory: CTA bilaterally, no wheezing, no rhonchi Abdominal: Soft, NT, ND, bowel sounds + Extremities: no edema, no cyanosis   The results of significant diagnostics from this hospitalization (including imaging, microbiology, ancillary and laboratory) are listed below for reference.    Significant Diagnostic Studies: X-ray Chest Pa And Lateral  Result Date: 07/06/2016 CLINICAL DATA:  CHF EXAM: CHEST  2 VIEW COMPARISON:  07/05/2016 FINDINGS: Cardiac shadow remains enlarged. Vascular congestion is again seen and stable. No sizable effusion is noted. No  focal infiltrate is seen. IMPRESSION: Mild vascular congestion.  No new focal abnormality is seen. Electronically Signed   By: Alcide CleverMark  Lukens M.D.   On: 07/06/2016 08:30   Dg Chest 2 View  Result Date: 06/15/2016 CLINICAL DATA:  Cough for 2 weeks. History of congestive heart failure. EXAM: CHEST  2 VIEW COMPARISON:  Chest radiograph 05/21/2014 FINDINGS: Unchanged advanced cardiomegaly. Mild pulmonary edema. No focal airspace consolidation. No pneumothorax or sizable pleural effusion. IMPRESSION: Advanced cardiomegaly, unchanged, with mild pulmonary edema. Electronically Signed   By: Deatra RobinsonKevin  Herman M.D.   On: 06/15/2016 02:19   Dg Ribs  Unilateral W/chest Left  Result Date: 07/05/2016 CLINICAL DATA:  Cough. EXAM: LEFT RIBS AND CHEST - 3+ VIEW COMPARISON:  06/15/2016 . FINDINGS: Cardiomegaly with pulmonary vascular prominence and bilateral interstitial prominence consistent congestive heart failure. No pleural effusion or pneumothorax. No focal bony abnormality identified.  No evidence of fracture. IMPRESSION: 1. Congestive heart failure with bilateral pulmonary interstitial edema. Similar findings noted on prior exam. 2. No focal bony abnormality identified. No evidence of displaced rib fracture or pneumothorax. Electronically Signed   By: Maisie Fushomas  Register   On: 07/05/2016 12:19     Microbiology: No results found for this or any previous visit (from the past 240 hour(s)).   Labs: Basic Metabolic Panel:  Recent Labs Lab 07/05/16 1549 07/06/16 0347 07/07/16 0514  NA 140 140 140  K 4.2 4.0 4.0  CL 103 104 99*  CO2 30 28 30   GLUCOSE 131* 106* 106*  BUN 16 15 16   CREATININE 1.51* 1.46* 1.37*  CALCIUM 9.2 9.2 9.3   Liver Function Tests:  Recent Labs Lab 07/06/16 0347  AST 20  ALT 21  ALKPHOS 39  BILITOT 1.6*  PROT 6.9  ALBUMIN 3.8   No results for input(s): LIPASE, AMYLASE in the last 168 hours. No results for input(s): AMMONIA in the last 168 hours. CBC:  Recent Labs Lab 07/05/16 1549  WBC 8.3  NEUTROABS 4.7  HGB 14.3  HCT 45.2  MCV 92.1  PLT 214   Cardiac Enzymes: No results for input(s): CKTOTAL, CKMB, CKMBINDEX, TROPONINI in the last 168 hours. BNP: Invalid input(s): POCBNP CBG: No results for input(s): GLUCAP in the last 168 hours.  Time coordinating discharge:  Greater than 30 minutes  Signed:  Hala Narula, DO [facility] Pager: [phone] 07/08/2016, 12:13 PM

## 2016-07-16 ENCOUNTER — Inpatient Hospital Stay (HOSPITAL_COMMUNITY): Payer: BLUE CROSS/BLUE SHIELD

## 2016-07-19 ENCOUNTER — Telehealth: Payer: Self-pay | Admitting: Family Medicine

## 2016-07-19 NOTE — Telephone Encounter (Signed)
APT. REMINDER CALL, LMTCB °

## 2016-07-22 ENCOUNTER — Encounter: Payer: BLUE CROSS/BLUE SHIELD | Admitting: Internal Medicine

## 2016-07-22 ENCOUNTER — Encounter (HOSPITAL_COMMUNITY): Payer: Self-pay

## 2016-07-22 ENCOUNTER — Ambulatory Visit (HOSPITAL_COMMUNITY)
Admission: RE | Admit: 2016-07-22 | Discharge: 2016-07-22 | Disposition: A | Discharge status: Home / Self Care | Payer: Managed Care, Other (non HMO) | Source: Ambulatory Visit | Attending: Cardiology | Admitting: Cardiology

## 2016-07-22 ENCOUNTER — Encounter: Payer: Self-pay | Admitting: Family Medicine

## 2016-07-22 VITALS — BP 142/90 | HR 106 | Wt >= 6400 oz

## 2016-07-22 DIAGNOSIS — I5022 Chronic systolic (congestive) heart failure: Secondary | ICD-10-CM | POA: Diagnosis not present

## 2016-07-22 DIAGNOSIS — I10 Essential (primary) hypertension: Secondary | ICD-10-CM

## 2016-07-22 DIAGNOSIS — I110 Hypertensive heart disease with heart failure: Secondary | ICD-10-CM | POA: Insufficient documentation

## 2016-07-22 DIAGNOSIS — G4733 Obstructive sleep apnea (adult) (pediatric): Secondary | ICD-10-CM | POA: Diagnosis not present

## 2016-07-22 DIAGNOSIS — I5042 Chronic combined systolic (congestive) and diastolic (congestive) heart failure: Secondary | ICD-10-CM | POA: Diagnosis not present

## 2016-07-22 DIAGNOSIS — I509 Heart failure, unspecified: Secondary | ICD-10-CM

## 2016-07-22 DIAGNOSIS — Z79899 Other long term (current) drug therapy: Secondary | ICD-10-CM | POA: Diagnosis not present

## 2016-07-22 DIAGNOSIS — Z6841 Body Mass Index (BMI) 40.0 and over, adult: Secondary | ICD-10-CM | POA: Diagnosis not present

## 2016-07-22 MED ORDER — SACUBITRIL-VALSARTAN 49-51 MG PO TABS: 1.0000 | ORAL_TABLET | Freq: Two times a day (BID) | ORAL | 3 refills | Status: DC

## 2016-07-22 NOTE — Patient Instructions (Signed)
STOP taking Lisinopril  START taking Entresto 49/51 mg (1 Tab) Two times daily.  START this medication evening of Tuesday December 12th. (36 hours after your last dose of Lisinopril on Sunday December 10th)  Labs in 1 week  Follow up in 4 weeks

## 2016-07-22 NOTE — Progress Notes (Signed)
Patient ID: Mark Baker, male   DOB: May 29, 1981, 35 y.o.   MRN: [id number] PCP: [facility] Primary Cardiologist: Dr. Elease HashimotoNahser  HPI:  Mark Baker is a 35 y.o. male with a history of morbid obesity, OSA, HTN, NICM and chronic combined systolic/diastolic HF.  Admitted 11/24 - 07/08/16 with volume overload in setting of cutting back his lasix so he would not run out.  Diuresed 20 lbs.   ECHO 07/07/2016: LVEF 20-25%. Moderately dilated RV. Grade II DD  He presents today for post hospital follow up. Weight stable from discharge. He continued to take Lisinopril instead of losartan on discharge. Feels fine since discharge. Has occasional HAs. Doesn't weight daily. Doesn't have a scale. No SOB walking around house or walking around the grocery store. Avoids stairs. No bendopnea. No further SOB with ADLs, like prior to admission.  Watching salt, reading labels. Drinking less than 2 L or right at. Taking lasix 80 mg BID.   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   ROS: All systems negative except as listed in HPI, PMH and Problem List.  SH:  Social History   Social History  . Marital status: Married    Spouse name: N/A  . Number of children: N/A  . Years of education: N/A   Occupational History  . Not on file.   Social History Main Topics  . Smoking status: Never Smoker  . Smokeless tobacco: Never Used  . Alcohol use 0.0 oz/week     Comment: seldom.  . Drug use: No  . Sexual activity: Not on file   Other Topics Concern  . Not on file   Social History Narrative   [place] 4 years.  Works for [employer] at [employer].  1 son and 2 daughters.     Denies cigarettes. Drank 1 beer last night 11/22/12. Denies drugs           FH:  Family History  Problem Relation Age of Onset  . Hypertension Father   . Heart failure Father     paternal side of family   . Hypertension Sister   . Stroke Maternal Grandmother   . Hypertension      maternal side of family      Past Medical History:  Diagnosis Date  . Asthma    childhood asthma no exacerbation since age 35   . Chronic combined systolic and diastolic CHF (congestive heart failure) (HCC)    a) ECHO (11/2012): EF 40-45%, RV fx difficult to see, nl size b) ECHO (05/2014): EF 20-25%, grade 2 DD, RV mildly dilated and sys fx mod reduced  . Hypertension   . NICM (nonischemic cardiomyopathy) (HCC)    a) (11/2012): normal coronaries  . OSA (obstructive sleep apnea)     Current Outpatient Prescriptions  Medication Sig Dispense Refill  . atorvastatin (LIPITOR) 40 MG tablet Take 1 tablet (40 mg total) by mouth daily. 30 tablet 0  . furosemide (LASIX) 80 MG tablet Take 1 tablet (80 mg total) by mouth 2 (two) times daily. 60 tablet 0  . lisinopril (PRINIVIL,ZESTRIL) 40 MG tablet Take 40 mg by mouth daily.    . metoprolol succinate (TOPROL-XL) 50 MG 24 hr tablet TAKE 1 TABLET BY MOUTH AT BEDTIME WITH OR IMMEDIATELY FOLLOWING A MEAL 30 tablet PRN  . spironolactone (ALDACTONE) 25 MG tablet Take 1 tablet (25 mg total) by mouth daily. 30 tablet 0   No current facility-administered medications for this encounter.  Vitals:   07/22/16 1012  BP: (!) 142/90  Pulse: (!) 106  SpO2: 93%  Weight: (!) 503 lb (228.2 kg)    PHYSICAL EXAM: General:  Morbidly Obese HEENT: Normal Neck: supple. JVP difficult to assess 2/2 girth, but does not appear elevated. Carotids 2+ bilaterally; no bruits. No thyromegaly or nodule noted.  Cor: PMI normal. Distant heart sounds, RRR. No M/G/R appreciated.  Lungs: CTAB, normal effort Abdomen: Morbidly obese, NT, ND, no HSM. No bruits or masses. +BS  Extremities: obese, no cyanosis, clubbing, rash, trace bilateral edema Neuro: alert & orientedx3, cranial nerves grossly intact. Moves all 4 extremities w/o difficulty. Affect pleasant.  ASSESSMENT & PLAN:  1) Chronic combined systolic/diastolic HF: NICM, EF 20-25%, grade II, RV sys fx mod reduced (05/2014) - Reviewed  discharge summary and patient recently discharged from the hospital for A/C HF.  - NHYA II symptoms and volume status stable.  - Continue Toprol 50 mg daily. -  STOP lisinopril.  Will have him start Entresto 49/51 mg BID. Recent BMET stable. Recheck next week with switch to [employer].  - Continue lasix 80 mg BID.  - Continue spironolactone 25 mg daily.  - Reinforced fluid restriction to < 2 L daily, sodium restriction to less than 2000 mg daily, and the importance of daily weights.     2) HTN - Mildly elevated, but ran out of lisinopril yesterday.  - Stopping lisinopril and transition to [facility] as above.   3) Morbid Obesity - Needs to lose weight. Needs to watch portion size and increase activity as able. 4) OSA - Have previously filled out form for patient through [employer] to see if we can get him a CPAP. He states he was never able to get CPAP  Meds as above.  BMET 1 week with holidays.  Follow up 4 weeks. Sooner with symptoms.   Graciella Freer, PA-C 10:38 AM

## 2016-07-22 NOTE — Progress Notes (Signed)
Scale given to patient at office visit today

## 2016-07-29 ENCOUNTER — Inpatient Hospital Stay (HOSPITAL_COMMUNITY): Admission: RE | Admit: 2016-07-29 | Payer: Managed Care, Other (non HMO) | Source: Ambulatory Visit

## 2016-07-31 ENCOUNTER — Encounter (HOSPITAL_COMMUNITY): Payer: Self-pay

## 2016-07-31 NOTE — Progress Notes (Signed)
FMLA on behalf of patient completed and signed by Casimiro Needle "[employer]. Intermittent time off written with no restriction to hours/week. Rest breaks as needed. Paperwork faxed per patient request to provided fax # [phone] Copy of forms scanned into patient's electronic medical record. Case # [id number] IFN  Ave Filter, RN

## 2016-08-06 ENCOUNTER — Telehealth (HOSPITAL_COMMUNITY): Payer: Self-pay | Admitting: Pharmacist

## 2016-08-06 NOTE — Telephone Encounter (Signed)
Entresto 49-51 mg BID PA approved by [employer] through 07/26/17.   Tyler Deis. Bonnye Fava, PharmD, BCPS, CPP Clinical Pharmacist Pager: [phone] Phone: [phone] 08/06/2016 2:48 PM

## 2016-08-14 ENCOUNTER — Other Ambulatory Visit (HOSPITAL_COMMUNITY): Payer: Self-pay | Admitting: Student

## 2016-08-14 MED FILL — METOPROLOL SUCC ER 50 MG TA: 50 | 30 days supply | Qty: 30 | Fill #1

## 2016-08-19 ENCOUNTER — Encounter (HOSPITAL_COMMUNITY): Payer: Managed Care, Other (non HMO)

## 2016-08-19 ENCOUNTER — Other Ambulatory Visit (HOSPITAL_COMMUNITY): Payer: Self-pay | Admitting: *Deleted

## 2016-08-23 ENCOUNTER — Other Ambulatory Visit (HOSPITAL_COMMUNITY): Payer: Self-pay | Admitting: *Deleted

## 2016-08-23 MED ORDER — ATORVASTATIN CALCIUM 40 MG PO TABS: 40.0000 mg | ORAL_TABLET | Freq: Every day | ORAL | 3 refills | Status: DC

## 2016-09-02 ENCOUNTER — Inpatient Hospital Stay (HOSPITAL_COMMUNITY): Admission: RE | Admit: 2016-09-02 | Payer: Managed Care, Other (non HMO) | Source: Ambulatory Visit

## 2016-09-16 MED FILL — METOPROLOL SUCC ER 50 MG TA: 50 | 30 days supply | Qty: 30 | Fill #2

## 2016-10-03 ENCOUNTER — Telehealth (HOSPITAL_COMMUNITY): Payer: Self-pay | Admitting: *Deleted

## 2016-10-03 MED ORDER — LOSARTAN POTASSIUM 50 MG PO TABS: 50.0000 mg | ORAL_TABLET | Freq: Every day | ORAL | 3 refills | Status: DC

## 2016-10-03 NOTE — Telephone Encounter (Signed)
Pt aware, he states he started coughing in Dec and it has continued to get worse.  Pt will stop Entresto and start Losartan, rx sent in, he is sch for f/u appt 3/12 will repeat labs at that time.

## 2016-10-03 NOTE — Telephone Encounter (Signed)
Pt called to report he has been having a constant cough since starting the [facility] and it continues to get worse.  He denies any recent cold/congestion/sickness.  He states he has not been weighing himself b/c he does not have a scale that goes up high enough, but he does not feel like he has any fluid, denies any SOB or edema.  Pt was switched from Lisinopril to [facility] in Dec per Otilio Saber, PA, will send to him for further recommendations.

## 2016-10-03 NOTE — Telephone Encounter (Signed)
How long has he been taking Entresto?  If started in December and cough started recently unlikely source.   If has been coughing whole time,  Switch to Losartan 50 mg QHS.    Thanks.  Needs BMET 10-14 days.  Needs follow up.   Casimiro Needle [address]" [place], PA-C 10/03/2016 10:34 AM

## 2016-10-15 ENCOUNTER — Encounter (HOSPITAL_COMMUNITY): Payer: Self-pay

## 2016-10-15 ENCOUNTER — Other Ambulatory Visit (HOSPITAL_COMMUNITY): Payer: Self-pay | Admitting: Cardiology

## 2016-10-15 DIAGNOSIS — I10 Essential (primary) hypertension: Secondary | ICD-10-CM

## 2016-10-15 DIAGNOSIS — I5043 Acute on chronic combined systolic (congestive) and diastolic (congestive) heart failure: Secondary | ICD-10-CM

## 2016-10-15 MED ORDER — METOPROLOL SUCCINATE ER 50 MG PO TB24: ORAL_TABLET | ORAL | 3 refills | Status: DC

## 2016-10-15 MED ORDER — FUROSEMIDE 80 MG PO TABS
80.0000 mg | ORAL_TABLET | Freq: Two times a day (BID) | ORAL | 3 refills | Status: DC
Start: 2016-10-15 — End: 2016-11-14

## 2016-10-15 MED ORDER — SPIRONOLACTONE 25 MG PO TABS: 25.0000 mg | ORAL_TABLET | Freq: Every day | ORAL | 3 refills | Status: DC

## 2016-10-15 MED ORDER — ATORVASTATIN CALCIUM 40 MG PO TABS: 40.0000 mg | ORAL_TABLET | Freq: Every day | ORAL | 3 refills | Status: DC

## 2016-10-15 MED ORDER — LOSARTAN POTASSIUM 50 MG PO TABS: 50.0000 mg | ORAL_TABLET | Freq: Every day | ORAL | 3 refills | Status: DC

## 2016-10-15 NOTE — Progress Notes (Signed)
Addendum made to [facility] for patient's flare ups. Original stated 1 flare up every 3 months up to 3 days max. Patient states he needs more frequent flare ups with less time. Addendum made for flare ups once a month 1-3 days each flare up. Faxed to provided # [place] [phone] Addended forms scanned into patient's electronic medical record.  Ave Filter, RN

## 2016-10-21 ENCOUNTER — Encounter (HOSPITAL_COMMUNITY): Payer: Managed Care, Other (non HMO)

## 2016-10-31 ENCOUNTER — Emergency Department (HOSPITAL_COMMUNITY): Payer: Managed Care, Other (non HMO)

## 2016-10-31 ENCOUNTER — Encounter (HOSPITAL_COMMUNITY): Payer: Self-pay

## 2016-10-31 ENCOUNTER — Inpatient Hospital Stay (HOSPITAL_COMMUNITY)
Admission: EM | Admit: 2016-10-31 | Discharge: 2016-11-14 | DRG: 291 | Disposition: A | Discharge status: Home / Self Care | Payer: Managed Care, Other (non HMO) | Attending: Internal Medicine | Admitting: Internal Medicine

## 2016-10-31 DIAGNOSIS — N189 Chronic kidney disease, unspecified: Secondary | ICD-10-CM | POA: Diagnosis not present

## 2016-10-31 DIAGNOSIS — Z79899 Other long term (current) drug therapy: Secondary | ICD-10-CM | POA: Diagnosis not present

## 2016-10-31 DIAGNOSIS — Z9119 Patient's noncompliance with other medical treatment and regimen: Secondary | ICD-10-CM | POA: Diagnosis not present

## 2016-10-31 DIAGNOSIS — I5043 Acute on chronic combined systolic (congestive) and diastolic (congestive) heart failure: Secondary | ICD-10-CM | POA: Diagnosis present

## 2016-10-31 DIAGNOSIS — E876 Hypokalemia: Secondary | ICD-10-CM | POA: Diagnosis present

## 2016-10-31 DIAGNOSIS — I351 Nonrheumatic aortic (valve) insufficiency: Secondary | ICD-10-CM | POA: Diagnosis not present

## 2016-10-31 DIAGNOSIS — I5023 Acute on chronic systolic (congestive) heart failure: Secondary | ICD-10-CM | POA: Diagnosis not present

## 2016-10-31 DIAGNOSIS — I130 Hypertensive heart and chronic kidney disease with heart failure and stage 1 through stage 4 chronic kidney disease, or unspecified chronic kidney disease: Secondary | ICD-10-CM | POA: Diagnosis present

## 2016-10-31 DIAGNOSIS — I4891 Unspecified atrial fibrillation: Secondary | ICD-10-CM | POA: Diagnosis present

## 2016-10-31 DIAGNOSIS — I428 Other cardiomyopathies: Secondary | ICD-10-CM | POA: Diagnosis present

## 2016-10-31 DIAGNOSIS — Z6841 Body Mass Index (BMI) 40.0 and over, adult: Secondary | ICD-10-CM | POA: Diagnosis not present

## 2016-10-31 DIAGNOSIS — N179 Acute kidney failure, unspecified: Secondary | ICD-10-CM | POA: Diagnosis present

## 2016-10-31 DIAGNOSIS — N183 Chronic kidney disease, stage 3 (moderate): Secondary | ICD-10-CM | POA: Diagnosis present

## 2016-10-31 DIAGNOSIS — G4733 Obstructive sleep apnea (adult) (pediatric): Secondary | ICD-10-CM | POA: Diagnosis present

## 2016-10-31 DIAGNOSIS — R570 Cardiogenic shock: Secondary | ICD-10-CM | POA: Diagnosis present

## 2016-10-31 DIAGNOSIS — Z9114 Patient's other noncompliance with medication regimen: Secondary | ICD-10-CM | POA: Diagnosis not present

## 2016-10-31 DIAGNOSIS — R079 Chest pain, unspecified: Secondary | ICD-10-CM

## 2016-10-31 DIAGNOSIS — I480 Paroxysmal atrial fibrillation: Secondary | ICD-10-CM | POA: Diagnosis present

## 2016-10-31 DIAGNOSIS — I509 Heart failure, unspecified: Secondary | ICD-10-CM | POA: Diagnosis not present

## 2016-10-31 LAB — CBC
HCT: 47.9 % (ref 39.0–52.0)
HEMOGLOBIN: 15.5 g/dL (ref 13.0–17.0)
MCH: 28.8 pg (ref 26.0–34.0)
MCHC: 32.4 g/dL (ref 30.0–36.0)
MCV: 88.9 fL (ref 78.0–100.0)
Platelets: 258 10*3/uL (ref 150–400)
RBC: 5.39 MIL/uL (ref 4.22–5.81)
RDW: 14 % (ref 11.5–15.5)
WBC: 10.3 10*3/uL (ref 4.0–10.5)

## 2016-10-31 LAB — PROTIME-INR
INR: 1.38
Prothrombin Time: 17.1 seconds — ABNORMAL HIGH (ref 11.4–15.2)

## 2016-10-31 LAB — COMPREHENSIVE METABOLIC PANEL
ALBUMIN: 3.8 g/dL (ref 3.5–5.0)
ALT: 55 U/L (ref 17–63)
AST: 47 U/L — AB (ref 15–41)
Alkaline Phosphatase: 50 U/L (ref 38–126)
Anion gap: 13 (ref 5–15)
BUN: 34 mg/dL — AB (ref 6–20)
CHLORIDE: 98 mmol/L — AB (ref 101–111)
CO2: 24 mmol/L (ref 22–32)
CREATININE: 2.02 mg/dL — AB (ref 0.61–1.24)
Calcium: 8.9 mg/dL (ref 8.9–10.3)
GFR calc Af Amer: 47 mL/min — ABNORMAL LOW (ref >60)
GFR, EST NON AFRICAN AMERICAN: 41 mL/min — AB (ref >60)
Glucose, Bld: 127 mg/dL — ABNORMAL HIGH (ref 65–99)
Potassium: 3.9 mmol/L (ref 3.5–5.1)
Sodium: 135 mmol/L (ref 135–145)
Total Bilirubin: 3.4 mg/dL — ABNORMAL HIGH (ref 0.3–1.2)
Total Protein: 6.4 g/dL — ABNORMAL LOW (ref 6.5–8.1)

## 2016-10-31 LAB — BRAIN NATRIURETIC PEPTIDE: B NATRIURETIC PEPTIDE 5: 831.9 pg/mL — AB (ref 0.0–100.0)

## 2016-10-31 LAB — I-STAT TROPONIN, ED: Troponin i, poc: 0.08 ng/mL (ref 0.00–0.08)

## 2016-10-31 MED ORDER — HYDRALAZINE HCL 20 MG/ML IJ SOLN: 10.0000 mg | Freq: Once | INTRAMUSCULAR | Status: DC

## 2016-10-31 MED ORDER — FUROSEMIDE 10 MG/ML IJ SOLN
80.0000 mg | Freq: Once | INTRAMUSCULAR | Status: AC
  Administered 2016-10-31: 80 mg via INTRAVENOUS
  Filled 2016-10-31: qty 8

## 2016-10-31 MED ORDER — HEPARIN BOLUS VIA INFUSION
4000.0000 [IU] | Freq: Once | INTRAVENOUS | Status: AC
  Administered 2016-10-31: 4000 [IU] via INTRAVENOUS
  Filled 2016-10-31: qty 4000

## 2016-10-31 MED ORDER — SODIUM CHLORIDE 0.9 % IV SOLN: 250.0000 mL | INTRAVENOUS | Status: DC | PRN

## 2016-10-31 MED ORDER — ATORVASTATIN CALCIUM 40 MG PO TABS
40.0000 mg | ORAL_TABLET | Freq: Every day | ORAL | Status: DC
  Administered 2016-10-31 – 2016-11-14 (×15): 40 mg via ORAL
  Filled 2016-10-31 (×15): qty 1

## 2016-10-31 MED ORDER — FUROSEMIDE 10 MG/ML IJ SOLN
40.0000 mg | Freq: Once | INTRAMUSCULAR | Status: AC
  Administered 2016-10-31: 40 mg via INTRAVENOUS
  Filled 2016-10-31: qty 4

## 2016-10-31 MED ORDER — METOPROLOL SUCCINATE ER 50 MG PO TB24
50.0000 mg | ORAL_TABLET | Freq: Every day | ORAL | Status: DC
  Administered 2016-10-31: 50 mg via ORAL
  Filled 2016-10-31 (×2): qty 1

## 2016-10-31 MED ORDER — SODIUM CHLORIDE 0.9% FLUSH
3.0000 mL | INTRAVENOUS | Status: DC | PRN
  Administered 2016-11-02: 3 mL via INTRAVENOUS
  Filled 2016-10-31: qty 3

## 2016-10-31 MED ORDER — DILTIAZEM LOAD VIA INFUSION
10.0000 mg | Freq: Once | INTRAVENOUS | Status: AC
  Administered 2016-10-31: 10 mg via INTRAVENOUS
  Filled 2016-10-31: qty 10

## 2016-10-31 MED ORDER — SODIUM CHLORIDE 0.9% FLUSH
3.0000 mL | Freq: Two times a day (BID) | INTRAVENOUS | Status: DC
  Administered 2016-10-31 – 2016-11-12 (×12): 3 mL via INTRAVENOUS

## 2016-10-31 MED ORDER — HEPARIN (PORCINE) IN NACL 100-0.45 UNIT/ML-% IJ SOLN
2200.0000 [IU]/h | INTRAMUSCULAR | Status: DC
  Administered 2016-10-31: 2000 [IU]/h via INTRAVENOUS
  Administered 2016-11-01 – 2016-11-07 (×13): 2200 [IU]/h via INTRAVENOUS
  Filled 2016-10-31 (×17): qty 250

## 2016-10-31 MED ORDER — FUROSEMIDE 10 MG/ML IJ SOLN: 40.0000 mg | Freq: Once | INTRAMUSCULAR | Status: DC

## 2016-10-31 MED ORDER — METOPROLOL TARTRATE 5 MG/5ML IV SOLN: 5.0000 mg | INTRAVENOUS | Status: DC

## 2016-10-31 MED ORDER — ACETAMINOPHEN 325 MG PO TABS
650.0000 mg | ORAL_TABLET | ORAL | Status: DC | PRN
  Administered 2016-11-01 – 2016-11-09 (×5): 650 mg via ORAL
  Filled 2016-10-31 (×5): qty 2

## 2016-10-31 MED ORDER — METOPROLOL TARTRATE 5 MG/5ML IV SOLN
5.0000 mg | INTRAVENOUS | Status: DC
  Administered 2016-10-31 – 2016-11-01 (×3): 5 mg via INTRAVENOUS
  Filled 2016-10-31 (×4): qty 5

## 2016-10-31 MED ORDER — DILTIAZEM HCL-DEXTROSE 100-5 MG/100ML-% IV SOLN (PREMIX)
5.0000 mg/h | INTRAVENOUS | Status: DC
  Administered 2016-10-31: 5 mg/h via INTRAVENOUS
  Filled 2016-10-31: qty 100

## 2016-10-31 NOTE — ED Provider Notes (Signed)
[facility]-EMERGENCY DEPT Provider Note   CSN: [id number] Arrival date & time: 10/31/16  0900     History   Chief Complaint Chief Complaint  Patient presents with  . Chest Pain    HPI Mark Baker is a 36 y.o. male.  Patient is a 36 year old male with past medical history of congestive heart failure, nonischemic cardiomyopathy, and obesity. He presents today for evaluation of weakness and shortness of breath. He also reports experiencing chest pains which occur primarily at night. These are sharp in nature and located in the front of his chest. He states that he can make this go away by breathing rapidly, then seems to return within 30 minutes to an hour. Denies any palpitations and denies any history of atrial fibrillation.   The history is provided by the patient.  Chest Pain   This is a new problem. Episode onset: one week ago. The problem occurs constantly. The problem has been gradually worsening. The pain is present in the substernal region. The pain is moderate. The pain does not radiate.    Past Medical History:  Diagnosis Date  . Asthma    childhood asthma no exacerbation since age 26   . Chronic combined systolic and diastolic CHF (congestive heart failure) (HCC)    a) ECHO (11/2012): EF 40-45%, RV fx difficult to see, nl size b) ECHO (05/2014): EF 20-25%, grade 2 DD, RV mildly dilated and sys fx mod reduced  . Hypertension   . NICM (nonischemic cardiomyopathy) (HCC)    a) (11/2012): normal coronaries  . OSA (obstructive sleep apnea)     Patient Active Problem List   Diagnosis Date Noted  . Congestive heart failure (CHF) (HCC) 07/05/2016  . Morbid obesity with body mass index (BMI) of 60.0 to 69.9 in adult [facility]) 07/05/2016  . CKD (chronic kidney disease) stage 2, GFR 60-89 ml/min 07/05/2016  . Chronic systolic heart failure (HCC) 06/03/2014  . Hyperlipidemia 11/25/2012  . Prolonged QT interval 11/24/2012  . Healthcare maintenance 09/03/2012  . HTN (hypertension)  02/19/2012  . Sleep apnea 02/10/2012  . Morbid obesity (HCC) 02/10/2012    Past Surgical History:  Procedure Laterality Date  . ANTERIOR CRUCIATE LIGAMENT REPAIR     right  . LEFT HEART CATHETERIZATION WITH CORONARY ANGIOGRAM N/A 11/24/2012   Procedure: LEFT HEART CATHETERIZATION WITH CORONARY ANGIOGRAM;  Surgeon: Peter M Swaziland, MD;  Location: [facility] CATH LAB;  Service: Cardiovascular;  Laterality: N/A;       Home Medications    Prior to Admission medications   Medication Sig Start Date End Date Taking? Authorizing Provider  atorvastatin (LIPITOR) 40 MG tablet Take 1 tablet (40 mg total) by mouth daily. 10/15/16   Laurey Morale, MD  furosemide (LASIX) 80 MG tablet Take 1 tablet (80 mg total) by mouth 2 (two) times daily. 10/15/16   Laurey Morale, MD  losartan (COZAAR) 50 MG tablet Take 1 tablet (50 mg total) by mouth at bedtime. 10/15/16 01/13/17  Laurey Morale, MD  metoprolol succinate (TOPROL-XL) 50 MG 24 hr tablet TAKE 1 TABLET BY MOUTH AT BEDTIME WITH OR IMMEDIATELY FOLLOWING A MEAL 10/15/16   Laurey Morale, MD  spironolactone (ALDACTONE) 25 MG tablet Take 1 tablet (25 mg total) by mouth daily. 10/15/16   Laurey Morale, MD    Family History Family History  Problem Relation Age of Onset  . Hypertension Father   . Heart failure Father     paternal side of family   . Hypertension Sister   .  Stroke Maternal Grandmother   . Hypertension      maternal side of family     Social History Social History  Substance Use Topics  . Smoking status: Never Smoker  . Smokeless tobacco: Never Used  . Alcohol use 0.0 oz/week     Comment: seldom.     Allergies   Aspirin   Review of Systems Review of Systems  Cardiovascular: Positive for chest pain.  All other systems reviewed and are negative.    Physical Exam Updated Vital Signs BP (!) 157/121 (BP Location: Left Arm)   Pulse (!) 130   Temp 98 F (36.7 C) (Oral)   Resp 20   Ht 6\' 3"  (1.905 m)   SpO2 96%   Physical Exam    Constitutional: He is oriented to person, place, and time. He appears well-developed and well-nourished. No distress.  HENT:  Head: Normocephalic and atraumatic.  Mouth/Throat: Oropharynx is clear and moist.  Neck: Normal range of motion. Neck supple.  Cardiovascular: Exam reveals no friction rub.   No murmur heard. Heart is irregularly irregular and rapid  Pulmonary/Chest: Effort normal. No respiratory distress. He has no wheezes.  Lung exam is limited secondary to body habitus, however there appear to be slight rales in the bases.  Abdominal: Soft. Bowel sounds are normal. He exhibits no distension. There is no tenderness.  Musculoskeletal: Normal range of motion. He exhibits edema.  There is 3+ pitting edema of both lower extremities.  Neurological: He is alert and oriented to person, place, and time. Coordination normal.  Skin: Skin is warm and dry. He is not diaphoretic.  Nursing note and vitals reviewed.    ED Treatments / Results  Labs (all labs ordered are listed, but only abnormal results are displayed) Labs Reviewed  CBC  COMPREHENSIVE METABOLIC PANEL  BRAIN NATRIURETIC PEPTIDE  I-STAT TROPOININ, ED  I-STAT TROPOININ, ED    EKG  EKG Interpretation  Date/Time:  Thursday October 31 2016 09:08:21 EDT Ventricular Rate:  135 PR Interval:    QRS Duration: 102 QT Interval:  332 QTC Calculation: 498 R Axis:   -56 Text Interpretation:  Atrial fibrillation with rapid ventricular response Left axis deviation Abnormal ECG Confirmed by Cassadie Pankonin  MD, Luiz Trumpower ([id number]) on 10/31/2016 10:01:46 AM       Radiology No results found.  Procedures Procedures (including critical care time)  Medications Ordered in ED Medications  diltiazem (CARDIZEM) 1 mg/mL load via infusion 10 mg (not administered)    And  diltiazem (CARDIZEM) 100 mg in dextrose 5% (1 mg/mL) infusion (not administered)     Initial Impression / Assessment and Plan / ED Course  I have reviewed the triage  vital signs and the nursing notes.  Pertinent labs & imaging results that were available during my care of the patient were reviewed by me and considered in my medical decision making (see chart for details).  Patient is a 36 year old male who presents with complaints of chest pain and shortness of breath as above. His workup today reveals new onset atrial fibrillation with RVR, elevated BNP, and signs of heart failure on his chest x-ray.  He was started on a Cardizem drip and given IV Lasix. I have consulted cardiology who will evaluate and admit the patient for further workup.  CRITICAL CARE Performed by: Geoffery Lyons Total critical care time: 45 minutes Critical care time was exclusive of separately billable procedures and treating other patients. Critical care was necessary to treat or prevent imminent or life-threatening  deterioration. Critical care was time spent personally by me on the following activities: development of treatment plan with patient and/or surrogate as well as nursing, discussions with consultants, evaluation of patient's response to treatment, examination of patient, obtaining history from patient or surrogate, ordering and performing treatments and interventions, ordering and review of laboratory studies, ordering and review of radiographic studies, pulse oximetry and re-evaluation of patient's condition.   Final Clinical Impressions(s) / ED Diagnoses   Final diagnoses:  None    New Prescriptions New Prescriptions   No medications on file     Geoffery Lyons, MD 10/31/16 1616

## 2016-10-31 NOTE — ED Notes (Signed)
Dr. Delo at bedside. 

## 2016-10-31 NOTE — Progress Notes (Signed)
ANTICOAGULATION CONSULT NOTE - Initial Consult  Pharmacy Consult for Heparin  Indication: atrial fibrillation  Allergies  Allergen Reactions  . Aspirin Hives, Itching and Rash    Patient Measurements: Height: 6\' 3"  (190.5 cm) IBW/kg (Calculated) : 84.5 Heparin Dosing Weight: 144kg   Vital Signs: Temp: 98 F (36.7 C) (03/22 0908) Temp Source: Oral (03/22 0908) BP: 144/124 (03/22 1445) Pulse Rate: 66 (03/22 1445)  Labs:  Recent Labs  10/31/16 1005  HGB 15.5  HCT 47.9  PLT 258  CREATININE 2.02*    CrCl cannot be calculated (Unknown ideal weight.).   Medical History: Past Medical History:  Diagnosis Date  . Asthma    childhood asthma no exacerbation since age 25   . Chronic combined systolic and diastolic CHF (congestive heart failure) (HCC)    a) ECHO (11/2012): EF 40-45%, RV fx difficult to see, nl size b) ECHO (05/2014): EF 20-25%, grade 2 DD, RV mildly dilated and sys fx mod reduced  . Hypertension   . NICM (nonischemic cardiomyopathy) (HCC)    a) (11/2012): normal coronaries  . OSA (obstructive sleep apnea)     Medications:   (Not in a hospital admission) Scheduled:  . hydrALAZINE  10 mg Intravenous Once  . metoprolol  5 mg Intravenous Q4H   Infusions:   Assessment: Patient is a 36 yom admitted 3/22 with new onset atrial fibrillation. CHADS-VASc at least 2 (HTN, HF). Not on anticoagulation PTA. CBC is within normal limits and no notes of bleeding.   Goal of Therapy:  Heparin level 0.3-0.7 units/ml Monitor platelets by anticoagulation protocol: Yes   Plan:  Heparin 4000 units IV bolus, then 2000 units/hr IV 6-hr heparin level Daily heparin level and CBC F/U plans for anticoagulation   Carylon Perches, PharmD Acute Care Pharmacy Resident  Pager: [phone] 10/31/2016

## 2016-10-31 NOTE — Progress Notes (Signed)
10/31/2016- Respiratory care note- CPAP placed in room for patient use as per MD order.  Pt declined use at this time.  Will keep avail for patient and offer again tomorrow at times of sleep.

## 2016-10-31 NOTE — ED Triage Notes (Addendum)
Per Pt, Pt reports chest pain and SOB x1 week. Complains of intermittent mid-center chest pain that has been lasting for the last week. Reports no urination in the past two days with Hx of taking diuretics. New swelling noted bilaterally to his legs.

## 2016-10-31 NOTE — H&P (Signed)
Patient ID: Sayed Apostol MRN: [id number], DOB/AGE: [id number]   Admit date: 10/31/2016   Primary Physician: No PCP Per Patient Primary Cardiologist: Dr. Gala Romney   Pt. Profile:  36 y/o morbidly obese AA male, with h/o combined systolic + diastolic HF, NICM with EF of 25-30% + G2DD on most recent echo 06/2016, LHC in 2014 per Dr. Swaziland demonstrating normal coronaries, HTN, and OSA (noncompliant with CPAP), as well as a h/o medical noncompliance, presenting to the ED with complaint of intermittent mid epigastric pain and dyspnea x 1 week and decreased urine output x 2 days, found to be in acute on chronic combined systolic + diastolic CHF and new onset atrial fibrillation w/ RVR.    Problem List  Past Medical History:  Diagnosis Date  . Asthma    childhood asthma no exacerbation since age 89   . Chronic combined systolic and diastolic CHF (congestive heart failure) (HCC)    a) ECHO (11/2012): EF 40-45%, RV fx difficult to see, nl size b) ECHO (05/2014): EF 20-25%, grade 2 DD, RV mildly dilated and sys fx mod reduced  . Hypertension   . NICM (nonischemic cardiomyopathy) (HCC)    a) (11/2012): normal coronaries  . OSA (obstructive sleep apnea)     Past Surgical History:  Procedure Laterality Date  . ANTERIOR CRUCIATE LIGAMENT REPAIR     right  . LEFT HEART CATHETERIZATION WITH CORONARY ANGIOGRAM N/A 11/24/2012   Procedure: LEFT HEART CATHETERIZATION WITH CORONARY ANGIOGRAM;  Surgeon: Peter M Swaziland, MD;  Location: [facility] CATH LAB;  Service: Cardiovascular;  Laterality: N/A;     Allergies  Allergies  Allergen Reactions  . Aspirin Hives, Itching and Rash    HPI  The patient is a 36 y/o morbidly obese male, with h/o combined systolic + diastolic HF, NICM with EF of 25-30% + G2DD on most recent echo 06/2016, LHC in 2014 per Dr. Swaziland demonstrating normal coronaries, HTN, and OSA but noncompliant with CPAP. Also with a h/o noncompliance with medications and clinic follow-ups. He has  been seen in the past by Dr. Gala Romney in the [facility] but as mentioned, noncompliant with follow-up. He was last admitted in Nov 2017 for acute on chronic CHF. This was in the setting of noncompliance with Lasix. He was treated with IV Lasix with 20 lb diuresis noted. His discharge weight in November was 502 lb. He was discharged home on 80 mg PO Lasix BID, Toprol XL 50 mg daily, Losartan 100 mg daily + spironolactone 25 mg daily. Note: pt was placed on Entreso at one point but did not tolerate due to sever cough.   He now presents to the [facility] ED with a complaint of dyspnea and sharp mid epigstric pain, over the last week. Also notes little urine output for the last 2 days, despite full compliance with lasix. CXR shows cardiomegaly with vascular congestion with probable interstitial edema. BNP 831.9. EKG demonstrates atrial fibrillation w/ RVR, which is a new diagnosis. He denies palpitations. CBC is unremarkable. BMP shows renal insufficiency with BUN at 34 and SCr at 2.02. Baseline SCr ~1.3-1.5. He does note reduced PO intake due to mid-epigastric pain.  K is stable at 3.9. BP is stable. He has been started on IV Lasix in the ED for diuresis as well as IV Cardizem for rate control. No weight has been obtained in the ED. First dose of IV lasix was just given during my examination.  His calculated CHA2DS2 VASc score is at least 2  for CHF and HTN. He denies any prior h/o stroke/TIA   Home Medications  Prior to Admission medications   Medication Sig Start Date End Date Taking? Authorizing Provider  atorvastatin (LIPITOR) 40 MG tablet Take 1 tablet (40 mg total) by mouth daily. 10/15/16  Yes Laurey Morale, MD  furosemide (LASIX) 80 MG tablet Take 1 tablet (80 mg total) by mouth 2 (two) times daily. 10/15/16  Yes Laurey Morale, MD  ibuprofen (ADVIL,MOTRIN) 200 MG tablet Take 200 mg by mouth every 6 (six) hours as needed for moderate pain.   Yes Historical Provider, MD  losartan (COZAAR) 50 MG  tablet Take 1 tablet (50 mg total) by mouth at bedtime. 10/15/16 01/13/17 Yes Laurey Morale, MD  metoprolol succinate (TOPROL-XL) 50 MG 24 hr tablet TAKE 1 TABLET BY MOUTH AT BEDTIME WITH OR IMMEDIATELY FOLLOWING A MEAL 10/15/16  Yes Laurey Morale, MD  spironolactone (ALDACTONE) 25 MG tablet Take 1 tablet (25 mg total) by mouth daily. 10/15/16  Yes Laurey Morale, MD    Family History  Family History  Problem Relation Age of Onset  . Hypertension Father   . Heart failure Father     paternal side of family   . Hypertension Sister   . Stroke Maternal Grandmother   . Hypertension      maternal side of family     Social History  Social History   Social History  . Marital status: Married    Spouse name: N/A  . Number of children: N/A  . Years of education: N/A   Occupational History  . Not on file.   Social History Main Topics  . Smoking status: Never Smoker  . Smokeless tobacco: Never Used  . Alcohol use 0.0 oz/week     Comment: seldom.  . Drug use: No  . Sexual activity: Not on file   Other Topics Concern  . Not on file   Social History Narrative   Lives in [place] with wife of 4 years.  Works for [employer] at [employer]  1 son and 2 daughters.     Denies cigarettes. Drank 1 beer last night 11/22/12. Denies drugs            Review of Systems General:  No chills, fever, night sweats or weight changes.  Cardiovascular:  No chest pain, dyspnea on exertion, + for edema, no orthopnea, palpitations, paroxysmal nocturnal dyspnea. Dermatological: No rash, lesions/masses Respiratory: No cough, + for dyspnea Urologic: No hematuria, dysuria, + for decreased urination  Abdominal:   + mid epigastric pain, No nausea, vomiting, diarrhea, bright red blood per rectum, melena, or hematemesis Neurologic:  No visual changes, wkns, changes in mental status. All other systems reviewed and are otherwise negative except as noted above.  Physical Exam  Blood pressure (!) 121/96, pulse (!) 42,  temperature 98 F (36.7 C), temperature source Oral, resp. rate 17, height 6\' 3"  (1.905 m), SpO2 97 %.  General: Pleasant, NAD, morbidly obese  Psych: Normal affect. Neuro: Alert and oriented X 3. Moves all extremities spontaneously. HEENT: Normal  Neck: Supple without bruits or JVD. Lungs:  Distant breath sounds due to obesity/ body habitus Heart: distant heart sounds due to body habits, irregularly irregular rhythm with tachy rate, no s3, s4, or murmurs. Abdomen: Soft, non-tender, non-distended, BS + x 4.  Extremities: obese w/ venous stasis dermatitis on bilateral LEE. DP/PT/Radials 2+ and equal bilaterally.  Labs  Troponin [facility] Test)  Recent Labs  10/31/16  0957  TROPIPOC 0.08   No results for input(s): CKTOTAL, CKMB, TROPONINI in the last 72 hours. Lab Results  Component Value Date   WBC 10.3 10/31/2016   HGB 15.5 10/31/2016   HCT 47.9 10/31/2016   MCV 88.9 10/31/2016   PLT 258 10/31/2016    Recent Labs Lab 10/31/16 1005  NA 135  K 3.9  CL 98*  CO2 24  BUN 34*  CREATININE 2.02*  CALCIUM 8.9  PROT 6.4*  BILITOT 3.4*  ALKPHOS 50  ALT 55  AST 47*  GLUCOSE 127*   Lab Results  Component Value Date   CHOL 185 07/06/2016   HDL 30 (L) 07/06/2016   LDLCALC 109 (H) 07/06/2016   TRIG 229 (H) 07/06/2016   Lab Results  Component Value Date   DDIMER <0.27 07/05/2016     Radiology/Studies  Dg Chest Port 1 View  Result Date: 10/31/2016 CLINICAL DATA:  Shortness of Breath.  History of CHF EXAM: PORTABLE CHEST 1 VIEW COMPARISON:  07/06/2016 FINDINGS: Cardiomegaly with vascular congestion. Mild interstitial prominence could reflect early interstitial edema. Study somewhat limited by body habitus. No visible definite effusions. No acute bony abnormality. IMPRESSION: Cardiomegaly with vascular congestion. Question early interstitial edema. Electronically Signed   By: Charlett Nose M.D.   On: 10/31/2016 10:48    ECG  Atrial Fibrillation w/ RVR 130s, --  personally reviewed     ASSESSMENT AND PLAN  36 y/o morbidly obese AA male, with h/o combined systolic + diastolic HF, NICM with EF of 25-30% + G2DD on most recent echo 06/2016, LHC in 2014 per Dr. Swaziland demonstrating normal coronaries, HTN, and OSA (noncompliant with CPAP), as well as a h/o medical noncompliance, presenting to the ED with complaint of intermittent mid epigastric pain and dyspnea x 1 week and decreased urine output x 2 days, found to be in acute on chronic combined systolic + diastolic CHF and new onset atrial fibrillation w/ RVR. Also with a/c renal insufficiency.   1. Acute on Chronic Combined Systolic + Diastolic CHF: EF 35-70% by echo 06/2016. CXR shows cardiomegaly with vascular congestion with probable interstitial edema. BNP is 831.9 (level may be falsely low due to morbid obesity). He is symptomatic with dyspnea an notes fluid retention (has been unable to check weight at home, scale maxed out at 500 lb). He reports full compliance with Lasix but with reduced UOP x 2 days. First dose of IV Lasix given in ED just now. Monitor response closely with strict I/Os. Daily weights + low sodium diet. Once diuresed, consider transition to PO torsemide for better GI absorption.   2. New Onset Atrial Fibrillation: in the setting of combined systolic + diastolic HF and untreated OSA. K is WNL. Check Mg level and TSH. We will need to further increase his home BB for better rate control. Avoid long term use of Cardizem given his systolic dysfunction.   This patients CHA2DS2-VASc Score and unadjusted Ischemic Stroke Rate (% per year) is equal to 2.2 % stroke rate/year from a score of 2 for CHF and HTN. Above score calculated as 1 point each if present [CHF, HTN, DM, Vascular=MI/PAD/Aortic Plaque, Age if 65-74, or Male] Above score calculated as 2 points each if present [Age > 75, or Stroke/TIA/TE]. He will benefit from addition of anticoagulation for stroke risk reduction, however he has a  known h/o medical noncompliance. Will need to address this prior to discharge.   He needs to improve compliance with CAP, as untreated OSA can  exacerbate atrial arrhthymias.    3. NICM: LHC in 2014 showed normal coronaries. Last assessment of EF was by echo 06/2016. EF was 25-30%. Continue medical therapy with BB, ARB and spironolactone. He did not tolerate Entreso in the past due to severe cough  4. Acute on Chronic Renal Insufficiency: baseline Scr ~1.3-1.5. SCr in ED elevated at 2.02. BUN 34. Monitor closely while diuresing with IV Lasix.   5. OSA: h/o noncompliance with CPAP. Pt now with new onset atrial fibrillation. Will need to stress importance of improving compliance to reduce risk of difficult to control atrial arrhythmias   6. H/o Noncompliance: Pt with noncompliance with meds and clinic f/u's. He will need counseling on importance of compliance with medical regimens given his multiple comorbidies. He has a rescheduled appt on 11/11/16 with Dr. Gala Romney  which he plans to keep.   7. Morbid Obesity: needs counseling  on weight loss.   Signed, Robbie Lis, PA-C 10/31/2016, 12:15 PM  Patient seen and examined  Agree with findings of B Sharol Harness above  Pt is a 36 yo with hx of morbid obesity, HTN, OSA and NICM  Presents with hx of increased SOB and diecreased urine output  Urine output down despite the use of IV lasix.  On arrival to ER found to be in afib with RVR ON exam Pt is morbidly obese  Hyertensive  HR 110s to 120s (on IV dilt) Neck full  Lungs Rel clear  Cardiac examZ;  Irreg irreg  No definite S3  Abd distended  LE1-2+ with chronic stasis changes   Afib is not helping forward output  May be cause of current exacerbation Cr has bumped  Would recomm IV b blocker for better HR control   Heparin Optimize BP then continue diuresis Stop aldactone and ACE I for now.  Would do better in SR  If rates not controlled may need to consider TEE cardioverson  Dietrich Pates

## 2016-10-31 NOTE — Progress Notes (Signed)
New pt admission from ED. Pt brought to the floor in stable condition. Vitals taken. Initial Assessment done. All immediate pertinent needs to patient addressed. Patient Guide given to patient. Important safety instructions relating to hospitalization reviewed with patient. Patient verbalized understanding. Will continue to monitor pt.  Mark Jaso RN 

## 2016-10-31 NOTE — Progress Notes (Signed)
Assessed pt for new IV site, this RN sees no places to try. Placed order for IV team consult.

## 2016-10-31 NOTE — ED Notes (Signed)
Waiting on bariatric bed for pt. Efraim Kaufmann, Charge RN made aware.

## 2016-11-01 DIAGNOSIS — I5023 Acute on chronic systolic (congestive) heart failure: Secondary | ICD-10-CM

## 2016-11-01 DIAGNOSIS — N189 Chronic kidney disease, unspecified: Secondary | ICD-10-CM

## 2016-11-01 DIAGNOSIS — I480 Paroxysmal atrial fibrillation: Secondary | ICD-10-CM

## 2016-11-01 LAB — BASIC METABOLIC PANEL
Anion gap: 12 (ref 5–15)
BUN: 33 mg/dL — AB (ref 6–20)
CHLORIDE: 99 mmol/L — AB (ref 101–111)
CO2: 26 mmol/L (ref 22–32)
CREATININE: 1.74 mg/dL — AB (ref 0.61–1.24)
Calcium: 8.8 mg/dL — ABNORMAL LOW (ref 8.9–10.3)
GFR, EST AFRICAN AMERICAN: 57 mL/min — AB (ref >60)
GFR, EST NON AFRICAN AMERICAN: 49 mL/min — AB (ref >60)
Glucose, Bld: 107 mg/dL — ABNORMAL HIGH (ref 65–99)
POTASSIUM: 3.7 mmol/L (ref 3.5–5.1)
Sodium: 137 mmol/L (ref 135–145)

## 2016-11-01 LAB — CBC
HCT: 44.9 % (ref 39.0–52.0)
Hemoglobin: 14.3 g/dL (ref 13.0–17.0)
MCH: 28.2 pg (ref 26.0–34.0)
MCHC: 31.8 g/dL (ref 30.0–36.0)
MCV: 88.6 fL (ref 78.0–100.0)
PLATELETS: 253 10*3/uL (ref 150–400)
RBC: 5.07 MIL/uL (ref 4.22–5.81)
RDW: 13.7 % (ref 11.5–15.5)
WBC: 11 10*3/uL — AB (ref 4.0–10.5)

## 2016-11-01 LAB — HEPARIN LEVEL (UNFRACTIONATED)
HEPARIN UNFRACTIONATED: 0.53 [IU]/mL (ref 0.30–0.70)
Heparin Unfractionated: 0.29 IU/mL — ABNORMAL LOW (ref 0.30–0.70)
Heparin Unfractionated: 0.58 IU/mL (ref 0.30–0.70)

## 2016-11-01 LAB — MAGNESIUM: MAGNESIUM: 1.9 mg/dL (ref 1.7–2.4)

## 2016-11-01 LAB — TSH: TSH: 0.914 u[IU]/mL (ref 0.350–4.500)

## 2016-11-01 MED ORDER — POTASSIUM CHLORIDE CRYS ER 20 MEQ PO TBCR
40.0000 meq | EXTENDED_RELEASE_TABLET | Freq: Every day | ORAL | Status: DC
  Administered 2016-11-01 – 2016-11-05 (×6): 40 meq via ORAL
  Filled 2016-11-01 (×6): qty 2

## 2016-11-01 MED ORDER — METOLAZONE 5 MG PO TABS
5.0000 mg | ORAL_TABLET | Freq: Once | ORAL | Status: AC
  Administered 2016-11-01: 5 mg via ORAL
  Filled 2016-11-01: qty 1

## 2016-11-01 MED ORDER — AMIODARONE HCL IN DEXTROSE 360-4.14 MG/200ML-% IV SOLN
30.0000 mg/h | INTRAVENOUS | Status: DC
  Administered 2016-11-02 – 2016-11-11 (×22): 30 mg/h via INTRAVENOUS
  Filled 2016-11-01 (×22): qty 200

## 2016-11-01 MED ORDER — METOPROLOL TARTRATE 50 MG PO TABS
50.0000 mg | ORAL_TABLET | Freq: Three times a day (TID) | ORAL | Status: DC
  Administered 2016-11-01: 50 mg via ORAL
  Filled 2016-11-01: qty 1

## 2016-11-01 MED ORDER — AMIODARONE HCL IN DEXTROSE 360-4.14 MG/200ML-% IV SOLN
60.0000 mg/h | INTRAVENOUS | Status: AC
  Administered 2016-11-01 (×2): 60 mg/h via INTRAVENOUS
  Filled 2016-11-01 (×2): qty 200

## 2016-11-01 MED ORDER — SODIUM CHLORIDE 0.9% FLUSH
3.0000 mL | Freq: Two times a day (BID) | INTRAVENOUS | Status: DC
  Administered 2016-11-01 – 2016-11-02 (×3): 3 mL via INTRAVENOUS

## 2016-11-01 MED ORDER — SODIUM CHLORIDE 0.9% FLUSH: 3.0000 mL | INTRAVENOUS | Status: DC | PRN

## 2016-11-01 MED ORDER — SODIUM CHLORIDE 0.9 % IV SOLN
250.0000 mL | INTRAVENOUS | Status: DC
  Administered 2016-11-01: 250 mL via INTRAVENOUS

## 2016-11-01 MED ORDER — GI COCKTAIL ~~LOC~~
30.0000 mL | Freq: Once | ORAL | Status: AC
  Administered 2016-11-01: 30 mL via ORAL
  Filled 2016-11-01: qty 30

## 2016-11-01 MED ORDER — FUROSEMIDE 10 MG/ML IJ SOLN
120.0000 mg | Freq: Once | INTRAMUSCULAR | Status: AC
  Administered 2016-11-01: 120 mg via INTRAVENOUS
  Filled 2016-11-01: qty 12

## 2016-11-01 MED ORDER — AMIODARONE LOAD VIA INFUSION
150.0000 mg | Freq: Once | INTRAVENOUS | Status: AC
  Administered 2016-11-01: 150 mg via INTRAVENOUS
  Filled 2016-11-01: qty 83.34

## 2016-11-01 MED ORDER — METOLAZONE 5 MG PO TABS: 5.0000 mg | ORAL_TABLET | Freq: Once | ORAL | Status: DC

## 2016-11-01 MED ORDER — MAGNESIUM SULFATE 2 GM/50ML IV SOLN
2.0000 g | Freq: Once | INTRAVENOUS | Status: AC
  Administered 2016-11-01: 2 g via INTRAVENOUS
  Filled 2016-11-01: qty 50

## 2016-11-01 MED ORDER — AMIODARONE LOAD VIA INFUSION: 150.0000 mg | Freq: Once | INTRAVENOUS | Status: DC

## 2016-11-01 MED ORDER — FUROSEMIDE 10 MG/ML IJ SOLN
120.0000 mg | Freq: Once | INTRAVENOUS | Status: DC
  Filled 2016-11-01: qty 12

## 2016-11-01 MED ORDER — FUROSEMIDE 10 MG/ML IJ SOLN
120.0000 mg | Freq: Two times a day (BID) | INTRAMUSCULAR | Status: DC
  Administered 2016-11-02 – 2016-11-10 (×16): 120 mg via INTRAVENOUS
  Filled 2016-11-01 (×7): qty 12
  Filled 2016-11-01: qty 10
  Filled 2016-11-01 (×13): qty 12

## 2016-11-01 NOTE — Progress Notes (Signed)
Upon assessment, pt tells RN that when given IV metoprolol he has a "pain" in his RUQ that last for 15/20 minutes and then subsides.

## 2016-11-01 NOTE — Progress Notes (Signed)
Notified RN of pt BP of 129/96.

## 2016-11-01 NOTE — Progress Notes (Signed)
ANTICOAGULATION CONSULT NOTE - Follow-up Consult  Pharmacy Consult for Heparin  Indication: atrial fibrillation  Allergies  Allergen Reactions  . Aspirin Hives, Itching and Rash    Patient Measurements: Height: 6\' 3"  (190.5 cm) Weight: (!) 519 lb 1.6 oz (235.5 kg) IBW/kg (Calculated) : 84.5 Heparin Dosing Weight: 144kg   Vital Signs: Temp: 97.7 F (36.5 C) (03/22 2011) Temp Source: Oral (03/22 2011) BP: 117/86 (03/23 0009) Pulse Rate: 121 (03/22 2011)  Labs:  Recent Labs  10/31/16 1005 10/31/16 2059 11/01/16 0013  HGB 15.5  --  14.3  HCT 47.9  --  44.9  PLT 258  --  253  LABPROT  --  17.1*  --   INR  --  1.38  --   HEPARINUNFRC  --   --  0.29*  CREATININE 2.02*  --   --     Estimated Creatinine Clearance: 103.6 mL/min (A) (by C-G formula based on SCr of 2.02 mg/dL (H)).  Assessment: Patient is a 14 yom admitted 3/22 with new onset atrial fibrillation. CHADS-VASc at least 2 (HTN, HF). Not on anticoagulation PTA. Pharmacy consulted for heparin.  Heparin level slightly subtherapeutic (0.29) on gtt at 2000 units/hr. CBC stable. No issues with line or bleeding reported per RN.  Goal of Therapy:  Heparin level 0.3-0.7 units/ml Monitor platelets by anticoagulation protocol: Yes   Plan:  Increase heparin to 2200 units/hr IV 6-hr heparin level F/U plans for oral anticoagulation   Christoper Fabian, PharmD, BCPS Clinical pharmacist, pager [phone] 11/01/2016

## 2016-11-01 NOTE — Progress Notes (Signed)
ANTICOAGULATION CONSULT NOTE - Follow Up Consult  Pharmacy Consult for Heparin Indication: atrial fibrillation  Allergies  Allergen Reactions  . Aspirin Hives, Itching and Rash    Patient Measurements: Height: 6\' 3"  (190.5 cm) Weight: (!) 519 lb 12.8 oz (235.8 kg) IBW/kg (Calculated) : 84.5 Heparin Dosing Weight:    Vital Signs: Temp: 97.4 F (36.3 C) (03/23 1644) Temp Source: Oral (03/23 1644) BP: 91/71 (03/23 1644) Pulse Rate: 119 (03/23 1644)  Labs:  Recent Labs  10/31/16 1005 10/31/16 2059 11/01/16 0013 11/01/16 0726 11/01/16 1552  HGB 15.5  --  14.3  --   --   HCT 47.9  --  44.9  --   --   PLT 258  --  253  --   --   LABPROT  --  17.1*  --   --   --   INR  --  1.38  --   --   --   HEPARINUNFRC  --   --  0.29* 0.53 0.58  CREATININE 2.02*  --  1.74*  --   --     Estimated Creatinine Clearance: 120.4 mL/min (A) (by C-G formula based on SCr of 1.74 mg/dL (H)).   Assessment: Anticoag: Heparin for new onset a fib. No AC PTA. HgB 14.3, Plt 253. Confirmatory HL 0.58   Goal of Therapy:  Heparin level 0.3-0.7 units/ml Monitor platelets by anticoagulation protocol: Yes   Plan:  Continue heparin 2200 units/hr IV Daily HL and CBC in am   Simpson Paulos S. Merilynn Finland, PharmD, BCPS Clinical Staff Pharmacist Pager [phone]  Misty Stanley Stillinger 11/01/2016,4:58 PM

## 2016-11-01 NOTE — Progress Notes (Signed)
Pt had 22 beat run of VT while in the process of setting up Amio drip. Pt asymptomatic, BP 97/83. Paged Tonye Becket, NP with CHF team. She and Dr. Shirlee Latch are aware. Orders to run Amio bolus over 20 minutes.

## 2016-11-01 NOTE — Progress Notes (Signed)
Mark Demark, PA returned call. She and Dr. Clifton James measured QT and believe QT is unchanged from previous EKG and they believe the machine is not picking up well. Will continue to monitor.

## 2016-11-01 NOTE — Progress Notes (Signed)
ANTICOAGULATION CONSULT NOTE - Follow-up Consult  Pharmacy Consult for Heparin  Indication: atrial fibrillation  Allergies  Allergen Reactions  . Aspirin Hives, Itching and Rash    Patient Measurements: Height: 6\' 3"  (190.5 cm) Weight: (!) 519 lb 12.8 oz (235.8 kg) IBW/kg (Calculated) : 84.5 Heparin Dosing Weight: 144kg   Vital Signs: Temp: 97.8 F (36.6 C) (03/23 0344) Temp Source: Oral (03/23 0344) BP: 111/73 (03/23 0344) Pulse Rate: 111 (03/23 0648)  Labs:  Recent Labs  10/31/16 1005 10/31/16 2059 11/01/16 0013 11/01/16 0726  HGB 15.5  --  14.3  --   HCT 47.9  --  44.9  --   PLT 258  --  253  --   LABPROT  --  17.1*  --   --   INR  --  1.38  --   --   HEPARINUNFRC  --   --  0.29* 0.53  CREATININE 2.02*  --  1.74*  --     Estimated Creatinine Clearance: 120.4 mL/min (A) (by C-G formula based on SCr of 1.74 mg/dL (H)).  Assessment: Patient is a 55 yom admitted 3/22 with new onset atrial fibrillation. CHADS-VASc at least 2 (HTN, HF). Not on anticoagulation PTA. Pharmacy consulted for heparin.  Heparin level therapeutic (0.53) after rate increase overnight. CBC stable. No issues with line or bleeding reported per RN.  Goal of Therapy:  Heparin level 0.3-0.7 units/ml Monitor platelets by anticoagulation protocol: Yes   Plan:  Continue heparin at 2200 units/hr IV 6-hr heparin level F/U plans for oral anticoagulation   Ruben Im, PharmD Clinical Pharmacist Pager: [phone] 11/01/2016 8:38 AM

## 2016-11-01 NOTE — Consult Note (Signed)
Advanced Heart Failure Team Consult Note  Referring Physician: Dr Mark Baker Primary Physician: None  Primary Cardiologist:  Dr Mark Baker  Reason for Consultation: Heart Failure  HPI:    [name] is 36 year old with a history of combined systolic/diastolic heart failure, NICM, OSA, obesity, and noncompliance admitted with new onset A fib RVR.   Admitted 11/24 - 07/08/16 with volume overload in setting medication noncompliance.   Admitted with A Fib RVR and volume overload. Started on lopressor, heparin, and IV lasix + metolazone. Lopressor later stopped due abdominal discomfort. Of note he was on Toprol XL at home. CXR with vascular congestion noted. Sluggish urine output noted. Pertinent admission labs include: BNP 831, Hgb 15.5, Sodium 135, Creatinine 2.02    Cardiac Studies ECHO 07/07/2016: LVEF 20-25%. Moderately dilated RV. Grade II [facility] 2014 normal coronaries   Review of Systems: [y] = yes, [ ]  = no   General: Weight gain [ Y]; Weight loss [ ] ; Anorexia [ ] ; Fatigue [ Y]; Fever [ ] ; Chills [ ] ; Weakness [ ]   Cardiac: Chest pain/pressure [ ] ; Resting SOB [Y ]; Exertional SOB [Y ]; Orthopnea [Y ]; Pedal Edema [ Y]; Palpitations [ ] ; Syncope [ ] ; Presyncope [ ] ; Paroxysmal nocturnal dyspnea[ ]   Pulmonary: Cough [ ] ; Wheezing[ ] ; Hemoptysis[ ] ; Sputum [ ] ; Snoring [ ]   GI: Vomiting[ ] ; Dysphagia[ ] ; Melena[ ] ; Hematochezia [ ] ; Heartburn[ ] ; Abdominal pain [ ] ; Constipation [ ] ; Diarrhea [ ] ; BRBPR [ ]   GU: Hematuria[ ] ; Dysuria [ ] ; Nocturia[ ]   Vascular: Pain in legs with walking [ ] ; Pain in feet with lying flat [ ] ; Non-healing sores [ ] ; Stroke [ ] ; TIA [ ] ; Slurred speech [ ] ;  Neuro: Headaches[ ] ; Vertigo[ ] ; Seizures[ ] ; Paresthesias[ ] ;Blurred vision [ ] ; Diplopia [ ] ; Vision changes [ ]   Ortho/Skin: Arthritis [ ] ; Joint pain [Y ]; Muscle pain [ ] ; Joint swelling [ ] ; Back Pain [Y ]; Rash [ ]   Psych: Depression[ ] ; Anxiety[ ]   Heme: Bleeding problems [ ] ; Clotting disorders [ ] ;  Anemia [ ]   Endocrine: Diabetes [ ] ; Thyroid dysfunction[ ]   Home Medications Prior to Admission medications   Medication Sig Start Date End Date Taking? Authorizing Provider  atorvastatin (LIPITOR) 40 MG tablet Take 1 tablet (40 mg total) by mouth daily. 10/15/16  Yes Mark Morale, MD  furosemide (LASIX) 80 MG tablet Take 1 tablet (80 mg total) by mouth 2 (two) times daily. 10/15/16  Yes Mark Morale, MD  ibuprofen (ADVIL,MOTRIN) 200 MG tablet Take 200 mg by mouth every 6 (six) hours as needed for moderate pain.   Yes Historical Provider, MD  losartan (COZAAR) 50 MG tablet Take 1 tablet (50 mg total) by mouth at bedtime. 10/15/16 01/13/17 Yes Mark Morale, MD  metoprolol succinate (TOPROL-XL) 50 MG 24 hr tablet TAKE 1 TABLET BY MOUTH AT BEDTIME WITH OR IMMEDIATELY FOLLOWING A MEAL 10/15/16  Yes Mark Morale, MD  spironolactone (ALDACTONE) 25 MG tablet Take 1 tablet (25 mg total) by mouth daily. 10/15/16  Yes Mark Morale, MD    Past Medical History: Past Medical History:  Diagnosis Date  . Asthma    childhood asthma no exacerbation since age 39   . Chronic combined systolic and diastolic CHF (congestive heart failure) (HCC)    a) ECHO (11/2012): EF 40-45%, RV fx difficult to see, nl size b) ECHO (05/2014): EF 20-25%, grade 2 DD, RV mildly dilated and sys fx  mod reduced  . Hypertension   . NICM (nonischemic cardiomyopathy) (HCC)    a) (11/2012): normal coronaries  . OSA (obstructive sleep apnea)     Past Surgical History: Past Surgical History:  Procedure Laterality Date  . ANTERIOR CRUCIATE LIGAMENT REPAIR     right  . LEFT HEART CATHETERIZATION WITH CORONARY ANGIOGRAM N/A 11/24/2012   Procedure: LEFT HEART CATHETERIZATION WITH CORONARY ANGIOGRAM;  Surgeon: Mark M Swaziland, MD;  Location: [facility] CATH LAB;  Service: Cardiovascular;  Laterality: N/A;    Family History: Family History  Problem Relation Age of Onset  . Hypertension Father   . Heart failure Father     paternal side of  family   . Hypertension Sister   . Stroke Maternal Grandmother   . Hypertension      maternal side of family     Social History: Social History   Social History  . Marital status: Married    Spouse name: N/A  . Number of children: N/A  . Years of education: N/A   Social History Main Topics  . Smoking status: Never Smoker  . Smokeless tobacco: Never Used  . Alcohol use 0.0 oz/week     Comment: seldom.  . Drug use: No  . Sexual activity: Not Asked   Other Topics Concern  . None   Social History Narrative   Lives in [place] with wife of 4 years.  Works for [employer] at [employer].  1 son and 2 daughters.     Denies cigarettes. Drank 1 beer last night 11/22/12. Denies drugs           Allergies:  Allergies  Allergen Reactions  . Aspirin Hives, Itching and Rash    Objective:    Vital Signs:   Temp:  [97.5 F (36.4 C)-98.1 F (36.7 C)] 97.5 F (36.4 C) (03/23 1209) Pulse Rate:  [28-123] 110 (03/23 1209) Resp:  [13-29] 20 (03/23 1209) BP: (106-144)/(65-124) 111/93 (03/23 1209) SpO2:  [93 %-99 %] 98 % (03/23 1209) Weight:  [519 lb 1.6 oz (235.5 kg)-521 lb (236.3 kg)] 519 lb 12.8 oz (235.8 kg) (03/23 0344) Last BM Date: 10/29/16  Weight change: Filed Weights   10/31/16 1611 10/31/16 1801 11/01/16 0344  Weight: (!) 521 lb (236.3 kg) (!) 519 lb 1.6 oz (235.5 kg) (!) 519 lb 12.8 oz (235.8 kg)    Intake/Output:   Intake/Output Summary (Last 24 hours) at 11/01/16 1232 Last data filed at 11/01/16 1150  Gross per 24 hour  Intake           975.72 ml  Output              825 ml  Net           150.72 ml     Physical Exam: General:  Sitting on the side of the bed. Mild dyspnea at rest HEENT: normal Neck: supple. JVP to ear . Carotids 2+ bilat; no bruits. No lymphadenopathy or thryomegaly appreciated. Cor: PMI nondisplaced. Irregular rate & rhythm. No rubs, gallops or murmurs. Lungs: clear on room air Abdomen: obese, soft, nontender, nondistended. No hepatosplenomegaly. No  bruits or masses. Good bowel sounds. Extremities: no cyanosis, clubbing, rash, Rand LLE 2+ ted hose on edema Neuro: alert & orientedx3, cranial nerves grossly intact. moves all 4 extremities w/o difficulty. Affect pleasant  Telemetry: a fib RVR 130s  Labs: Basic Metabolic Panel:  Recent Labs Lab 10/31/16 1005 11/01/16 0013  NA 135 137  K 3.9 3.7  CL 98* 99*  CO2 24 26  GLUCOSE 127* 107*  BUN 34* 33*  CREATININE 2.02* 1.74*  CALCIUM 8.9 8.8*  MG  --  1.9    Liver Function Tests:  Recent Labs Lab 10/31/16 1005  AST 47*  ALT 55  ALKPHOS 50  BILITOT 3.4*  PROT 6.4*  ALBUMIN 3.8   No results for input(s): LIPASE, AMYLASE in the last 168 hours. No results for input(s): AMMONIA in the last 168 hours.  CBC:  Recent Labs Lab 10/31/16 1005 11/01/16 0013  WBC 10.3 11.0*  HGB 15.5 14.3  HCT 47.9 44.9  MCV 88.9 88.6  PLT 258 253    Cardiac Enzymes: No results for input(s): CKTOTAL, CKMB, CKMBINDEX, TROPONINI in the last 168 hours.  BNP: BNP (last 3 results)  Recent Labs  07/05/16 1549 10/31/16 1005  BNP 327.3* 831.9*    ProBNP (last 3 results) No results for input(s): PROBNP in the last 8760 hours.   CBG: No results for input(s): GLUCAP in the last 168 hours.  Coagulation Studies:  Recent Labs  10/31/16 2059  LABPROT 17.1*  INR 1.38    Other results: EKG: A fib RVR 135 bpm  Imaging: Dg Chest Port 1 View  Result Date: 10/31/2016 CLINICAL DATA:  Shortness of Breath.  History of CHF EXAM: PORTABLE CHEST 1 VIEW COMPARISON:  07/06/2016 FINDINGS: Cardiomegaly with vascular congestion. Mild interstitial prominence could reflect early interstitial edema. Study somewhat limited by body habitus. No visible definite effusions. No acute bony abnormality. IMPRESSION: Cardiomegaly with vascular congestion. Question early interstitial edema. Electronically Signed   By: Charlett Nose M.D.   On: 10/31/2016 10:48      Medications:     Current  Medications: . atorvastatin  40 mg Oral Daily  . furosemide  120 mg Intravenous Once  . hydrALAZINE  10 mg Intravenous Once  . metoprolol tartrate  50 mg Oral TID  . sodium chloride flush  3 mL Intravenous Q12H     Infusions: . heparin 2,200 Units/hr (11/01/16 0351)      Assessment/Plan/Discussion    1. A fib RVR Intolerant lopressor. Discussed with EP. Will need to use amio drip. If remains in A fib will need TEE DC-CV on Monday.  Continue heparin drip with plan for coumadin.  2. A/C Systolic/Diastolic Heart Failure - NICM LHC 2014 normal cors.  ECHO 06/2016 EF~25%.Repeat ECHO  Volume overloaded. Today he received 120 mg IV lasix + metolazone. Will see how he responds. Tomorrow would restart 120 mg IV lasix twice a day. Follow renal function closely.  No spiro/Ace due to CKD. He has tried entresto in the past and developed a cough. Refuses to re-challenge.  Later can consider hydralazine/imdur. 3. Acute/CKD Stage III - Creatinine 2.0 >1.7. Follow closely 4. OSA--needs CPAP at night. He will need outpatient follow up.  5. Noncompliance  Length of Stay: 1  Amy Clegg NP-C  11/01/2016, 12:32 PM  Advanced Heart Failure Team Pager [phone] (M-F; 7a - 4p)  Please contact [facility] for night-coverage after hours (4p -7a ) and weekends on [email or web address]  Patient seen with NP, agree with the above note.   1. Atrial fibrillation: With RVR, HR 120s-130s currently.  He refuses metoprolol, says that it gives him abdominal pain.  We cannot use diltiazem given low EF.  We had been avoiding amiodarone as he has a mildly prolonged QT interval (497 msec on admission ECG).  However HR is quite high in the setting of decompensated CHF.   - Discussed with  Drs Ladona Ridgel and Mark Baker, will initiate amiodarone gtt for now given lack of options for rate control and follow QT interval closely (check in am).  If he cardioverts, will still be at high risk for recurrent atrial fibrillation.  Will need  treatment of OSA, consider long-term amiodarone if QTc stays stable.  Not a good ablation candidate given his size.  - Heparin gtt, add warfarin.  Think warfarin will be safest choice here as I would be concerned about using Eliquis or Xarelto with his size.   - Plan TEE-guided DCCV on Monday if he remains in atrial fibrillation.  Discussed risks/benefits of procedure with patient, he agrees.   2. Acute on chronic systolic CHF: EF 16% on 11/17 echo.  He was admitted with volume overload. Prominent JVD still suggesting ongoing volume overload.  Creatinine elevated at 1.74 but lower than yesterday.  - He got Lasix 120 mg IV x 1 with metolazone today.  Will not give more Lasix today, will write for Lasix 120 mg IV bid tomorrow but need to follow creatinine closely.  - If BP remains stable, can add hydralazine/nitrates tomorrow.  - Leave off losartan with elevated creatinine.  3. AKI on CKD stage 3: Creatinine up to 2 at admission from baseline around 1.4.  1.74 today.  Follow carefully with diuresis.  4. OSA: Needs to use CPAP.  Needs as outpatient.   Mark Baker 11/01/2016 2:19 PM

## 2016-11-01 NOTE — Progress Notes (Addendum)
NT notified RN of pt not feeling well. Upon entering room pt states that he feels ok other than the RUQ after metoprolol doses. He says he takes metoprolol at home and has never had this reaction. Pt also reports a sharp pain in R side of neck. Paged PA on team A about pt condition and HR sustaining above 110. PA returned called and is coming to see pt.

## 2016-11-01 NOTE — Progress Notes (Signed)
Progress Note  Patient Name: Mark Baker Date of Encounter: 11/01/2016  Primary Cardiologist: Nahser  Subjective  Breathing fair  No CP    Inpatient Medications    Scheduled Meds: . atorvastatin  40 mg Oral Daily  . hydrALAZINE  10 mg Intravenous Once  . metoprolol  5 mg Intravenous Q4H  . metoprolol succinate  50 mg Oral Daily  . sodium chloride flush  3 mL Intravenous Q12H   Continuous Infusions: . heparin 2,200 Units/hr (11/01/16 0351)   PRN Meds: sodium chloride, acetaminophen, sodium chloride flush   Vital Signs    Vitals:   11/01/16 0522 11/01/16 0619 11/01/16 0648 11/01/16 0831  BP:    (!) 129/96  Pulse: (!) 108 (!) 123 (!) 111 66  Resp:    20  Temp:    97.5 F (36.4 C)  TempSrc:    Oral  SpO2:    99%  Weight:      Height:        Intake/Output Summary (Last 24 hours) at 11/01/16 1008 Last data filed at 11/01/16 0648  Gross per 24 hour  Intake           975.72 ml  Output              300 ml  Net           675.72 ml   Filed Weights   10/31/16 1611 10/31/16 1801 11/01/16 0344  Weight: (!) 521 lb (236.3 kg) (!) 519 lb 1.6 oz (235.5 kg) (!) 519 lb 12.8 oz (235.8 kg)    Telemetry    Atrial fibrillation  110s to 130s   - Personally Reviewed  ECG     Physical Exam   GEN: No acute distress.   Neck: No JVD Cardiac: RRR, no murmurs, rubs, or gallops.  Respiratory: Clear to auscultation bilaterally. GI: Soft, nontender, non-distended  MS: No edema; No deformity. Neuro:  Nonfocal  Psych: Normal affect   Labs    Chemistry Recent Labs Lab 10/31/16 1005 11/01/16 0013  NA 135 137  K 3.9 3.7  CL 98* 99*  CO2 24 26  GLUCOSE 127* 107*  BUN 34* 33*  CREATININE 2.02* 1.74*  CALCIUM 8.9 8.8*  PROT 6.4*  --   ALBUMIN 3.8  --   AST 47*  --   ALT 55  --   ALKPHOS 50  --   BILITOT 3.4*  --   GFRNONAA 41* 49*  GFRAA 47* 57*  ANIONGAP 13 12     Hematology Recent Labs Lab 10/31/16 1005 11/01/16 0013  WBC 10.3 11.0*  RBC 5.39 5.07  HGB  15.5 14.3  HCT 47.9 44.9  MCV 88.9 88.6  MCH 28.8 28.2  MCHC 32.4 31.8  RDW 14.0 13.7  PLT 258 253    Cardiac EnzymesNo results for input(s): TROPONINI in the last 168 hours.  Recent Labs Lab 10/31/16 0957  TROPIPOC 0.08     BNP Recent Labs Lab 10/31/16 1005  BNP 831.9*     DDimer No results for input(s): DDIMER in the last 168 hours.   Radiology    Dg Chest Port 1 View  Result Date: 10/31/2016 CLINICAL DATA:  Shortness of Breath.  History of CHF EXAM: PORTABLE CHEST 1 VIEW COMPARISON:  07/06/2016 FINDINGS: Cardiomegaly with vascular congestion. Mild interstitial prominence could reflect early interstitial edema. Study somewhat limited by body habitus. No visible definite effusions. No acute bony abnormality. IMPRESSION: Cardiomegaly with vascular congestion. Question early interstitial edema. Electronically  Signed   By: Charlett Nose M.D.   On: 10/31/2016 10:48    Cardiac Studies     Patient Profile     Pt is a 36 yo with hx of morbid obesity (519 lb) presents with acute exacerbation of CHF   Also history HTN and OSA (noncompliant with CPAP)  Found to be in atrial fibrillation with RVR   Pt has hx of noncompliance   Very difficult situation  Assessment & Plan    1  Atrial fib  Rates still high  Pt says he is having burning sensations with IV and po lopressor  Not on dilt due to LV dysfunction   Reviewed EKGs from past  QT is long  Reviewed with EP  WOuld not use amiiodarone   Will review with pharmacy other options  I am worried he will quickly pop back into afib if he is not on something and if CHF is not better optimized    2  Acute on chronic diastolic CHF  Volume difficult to assess due to his size  Urin output is poor  Prob up  though he was lying flat when I came in to room  [place] he likes to lie flat in bed Review of I/O  he has not responded much to lasix he got  40  Then 80 mg   Will give Zaroxlyn and then lasix  Watch response    3 CKD   Cr is a little  better today   Not responding though to lasix  Add Zaroxyln     Signed, Dietrich Pates, MD  11/01/2016, 10:08 AM

## 2016-11-01 NOTE — Progress Notes (Signed)
Pt states upper abdomen aching.  States "Every time I take metoprolol it makes my stomach."  BP95/57, P 128.   Noted pt just received tylenol.  Primary nurse made aware.  Amanda Pea, [place]

## 2016-11-01 NOTE — Progress Notes (Signed)
Notified RN of pt HR of 110 and BP of 111/93

## 2016-11-01 NOTE — Progress Notes (Signed)
Noted patient having increased difficulty catching deep breaths. Performed EKG which shows QTc of 546. Paged on call cardiology PA, Bjorn Loser Barrett to make her aware. Pt BP 91/71 and HR currently 121 still in Afib.

## 2016-11-01 NOTE — Progress Notes (Signed)
RN called to room because pt states he is having a hard time breathing. Upon assessment, pt says he is having a hard time catching a deep breath since taking the GI cocktail. Pt O2 sats at this time are 99% on RA. Pt BP is 119/78. Placed pt on supplemental O2 at 2L for his comfort.

## 2016-11-02 ENCOUNTER — Inpatient Hospital Stay (HOSPITAL_COMMUNITY): Payer: Managed Care, Other (non HMO)

## 2016-11-02 DIAGNOSIS — N179 Acute kidney failure, unspecified: Secondary | ICD-10-CM

## 2016-11-02 LAB — BASIC METABOLIC PANEL
Anion gap: 15 (ref 5–15)
BUN: 44 mg/dL — AB (ref 6–20)
CALCIUM: 9.2 mg/dL (ref 8.9–10.3)
CHLORIDE: 97 mmol/L — AB (ref 101–111)
CO2: 23 mmol/L (ref 22–32)
CREATININE: 2.07 mg/dL — AB (ref 0.61–1.24)
GFR calc non Af Amer: 40 mL/min — ABNORMAL LOW (ref >60)
GFR, EST AFRICAN AMERICAN: 46 mL/min — AB (ref >60)
GLUCOSE: 123 mg/dL — AB (ref 65–99)
Potassium: 3.8 mmol/L (ref 3.5–5.1)
Sodium: 135 mmol/L (ref 135–145)

## 2016-11-02 LAB — HEPARIN LEVEL (UNFRACTIONATED): Heparin Unfractionated: 0.5 IU/mL (ref 0.30–0.70)

## 2016-11-02 LAB — CBC
HEMATOCRIT: 46.8 % (ref 39.0–52.0)
Hemoglobin: 15.1 g/dL (ref 13.0–17.0)
MCH: 28.6 pg (ref 26.0–34.0)
MCHC: 32.3 g/dL (ref 30.0–36.0)
MCV: 88.6 fL (ref 78.0–100.0)
PLATELETS: 262 10*3/uL (ref 150–400)
RBC: 5.28 MIL/uL (ref 4.22–5.81)
RDW: 13.8 % (ref 11.5–15.5)
WBC: 13.3 10*3/uL — ABNORMAL HIGH (ref 4.0–10.5)

## 2016-11-02 LAB — MRSA PCR SCREENING: MRSA BY PCR: NEGATIVE

## 2016-11-02 MED ORDER — SODIUM CHLORIDE 0.9% FLUSH
10.0000 mL | INTRAVENOUS | Status: DC | PRN
  Administered 2016-11-06: 10 mL
  Filled 2016-11-02: qty 40

## 2016-11-02 MED ORDER — SODIUM CHLORIDE 0.9 % IV SOLN
INTRAVENOUS | Status: DC
  Administered 2016-11-03 – 2016-11-04 (×2): via INTRAVENOUS

## 2016-11-02 MED ORDER — MILRINONE LACTATE IN DEXTROSE 20-5 MG/100ML-% IV SOLN
0.1250 ug/kg/min | INTRAVENOUS | Status: DC
  Administered 2016-11-02 – 2016-11-03 (×3): 0.25 ug/kg/min via INTRAVENOUS
  Administered 2016-11-03: 0.375 ug/kg/min via INTRAVENOUS
  Administered 2016-11-03: 0.25 ug/kg/min via INTRAVENOUS
  Administered 2016-11-03 – 2016-11-11 (×44): 0.375 ug/kg/min via INTRAVENOUS
  Administered 2016-11-11 – 2016-11-12 (×4): 0.25 ug/kg/min via INTRAVENOUS
  Administered 2016-11-12: 0.125 ug/kg/min via INTRAVENOUS
  Administered 2016-11-12: 0.25 ug/kg/min via INTRAVENOUS
  Administered 2016-11-13: 0.125 ug/kg/min via INTRAVENOUS
  Filled 2016-11-02: qty 100
  Filled 2016-11-02: qty 400
  Filled 2016-11-02 (×2): qty 100
  Filled 2016-11-02: qty 300
  Filled 2016-11-02 (×51): qty 100

## 2016-11-02 MED ORDER — SODIUM CHLORIDE 0.9% FLUSH
10.0000 mL | Freq: Two times a day (BID) | INTRAVENOUS | Status: DC
  Administered 2016-11-02 – 2016-11-10 (×11): 10 mL
  Administered 2016-11-10: 20 mL
  Administered 2016-11-11 – 2016-11-12 (×4): 10 mL
  Administered 2016-11-13: 20 mL
  Administered 2016-11-13: 10 mL

## 2016-11-02 MED ORDER — WARFARIN - PHARMACIST DOSING INPATIENT
Freq: Every day | Status: DC
  Administered 2016-11-08 – 2016-11-10 (×3)

## 2016-11-02 MED ORDER — WARFARIN SODIUM 10 MG PO TABS
10.0000 mg | ORAL_TABLET | Freq: Once | ORAL | Status: AC
  Administered 2016-11-02: 10 mg via ORAL
  Filled 2016-11-02: qty 1

## 2016-11-02 NOTE — Progress Notes (Signed)
Trasfered to 4N06, report given to [facility].Family at bedside during transfer.

## 2016-11-02 NOTE — Progress Notes (Signed)
Advanced Heart Failure Rounding Note   Subjective:    Started on amio for AF with RVR. Remains in AF rate 110-120   Sluggish diuresis on IV lasix. Weight unchanged.   Remains SOB. + orthopnea.  Urine dark. Creatinine back up. 1.7 -> 2.1  On heparin. No bleeding.    Objective:   Weight Range:  Vital Signs:   Temp:  [97.3 F (36.3 C)-97.8 F (36.6 C)] 97.7 F (36.5 C) (03/24 0900) Pulse Rate:  [72-128] 118 (03/24 0900) Resp:  [18-20] 18 (03/24 0900) BP: (90-119)/(57-93) 106/90 (03/24 0900) SpO2:  [97 %-100 %] 97 % (03/24 0900) Weight:  [235.9 kg (520 lb)] 235.9 kg (520 lb) (03/24 0616) Last BM Date: 11/01/16  Weight change: Filed Weights   10/31/16 1801 11/01/16 0344 11/02/16 0616  Weight: (!) 235.5 kg (519 lb 1.6 oz) (!) 235.8 kg (519 lb 12.8 oz) (!) 235.9 kg (520 lb)    Intake/Output:   Intake/Output Summary (Last 24 hours) at 11/02/16 1106 Last data filed at 11/02/16 0909  Gross per 24 hour  Intake          1306.01 ml  Output             1195 ml  Net           111.01 ml     Physical Exam: General:  Markedly obese male Sitting on the side of the bed. Mild dyspnea at rest HEENT: normal Neck: supple. JVP unable to see . Carotids 2+ bilat; no bruits. No lymphadenopathy or thryomegaly appreciated. Cor: PMI nonpalpable  Irregular rate & rhythm. Distant. No obvious murmur Lungs: clear on room air  Decreased throughout.  Abdomen: markedly  obese, soft, nontender, +distended. Unable to assess for hepatosplenomegaly. No bruits or masses. Good bowel sounds. Extremities: no cyanosis, clubbing, rash, 2+ woody edema. cool Neuro: alert & orientedx3, cranial nerves grossly intact. moves all 4 extremities w/o difficulty. Affect pleasant   Telemetry: AF 110-120. Personally reviewed   Labs: Basic Metabolic Panel:  Recent Labs Lab 10/31/16 1005 11/01/16 0013 11/02/16 0457  NA 135 137 135  K 3.9 3.7 3.8  CL 98* 99* 97*  CO2 24 26 23   GLUCOSE 127* 107* 123*    BUN 34* 33* 44*  CREATININE 2.02* 1.74* 2.07*  CALCIUM 8.9 8.8* 9.2  MG  --  1.9  --     Liver Function Tests:  Recent Labs Lab 10/31/16 1005  AST 47*  ALT 55  ALKPHOS 50  BILITOT 3.4*  PROT 6.4*  ALBUMIN 3.8   No results for input(s): LIPASE, AMYLASE in the last 168 hours. No results for input(s): AMMONIA in the last 168 hours.  CBC:  Recent Labs Lab 10/31/16 1005 11/01/16 0013 11/02/16 0457  WBC 10.3 11.0* 13.3*  HGB 15.5 14.3 15.1  HCT 47.9 44.9 46.8  MCV 88.9 88.6 88.6  PLT 258 253 262    Cardiac Enzymes: No results for input(s): CKTOTAL, CKMB, CKMBINDEX, TROPONINI in the last 168 hours.  BNP: BNP (last 3 results)  Recent Labs  07/05/16 1549 10/31/16 1005  BNP 327.3* 831.9*    ProBNP (last 3 results) No results for input(s): PROBNP in the last 8760 hours.    Other results:  Imaging:  No results found.   Medications:     Scheduled Medications: . atorvastatin  40 mg Oral Daily  . furosemide  120 mg Intravenous BID  . potassium chloride  40 mEq Oral Daily  . sodium chloride flush  3 mL Intravenous Q12H  . sodium chloride flush  3 mL Intravenous Q12H  . warfarin  10 mg Oral ONCE-1800  . Warfarin - Pharmacist Dosing Inpatient   Does not apply q1800     Infusions: . sodium chloride 250 mL (11/01/16 1512)  . amiodarone 30 mg/hr (11/02/16 0314)  . heparin 2,200 Units/hr (11/02/16 0315)     PRN Medications:  sodium chloride, acetaminophen, sodium chloride flush, sodium chloride flush   Assessment/Plan:   1. A/C Systolic/Diastolic Heart Failure - NICM LHC 2014 normal cors.ECHO 06/2016 EF~25%  - not diuresing well. Renal function worse. suspect he is low output. Will transfer to SDU. Place PICC. Start milrinone.  - Continue IV lasix. Supp electrolytes as needed.  - Repeat echo pending  - Hold ACE & b-blocker, No spiro/Ace due to CKD. He has tried entresto in the past and developed a cough. Refuses to re-challenge.  2. A fib  RVR - Intolerant lopressor. Discussed with EP. Not candidate for ablation  - Rate remains elevated with amio.  - Continue amio drip. - Continue heparin drip with plan for coumadin. Too big for NOAC.  - Will eventually need TEE/DC-CV when more stable 3. 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 OSA - needs CPAP at night. He will need outpatient follow up.  5. Super morbid obesity - needs weight loss.  5. Noncompliance - Will need paramedicine on d/c   Length of Stay: 2   Levern Kalka MD 11/02/2016, 11:06 AM  Advanced Heart Failure Team Pager [phone] (M-F; 7a - 4p)  Please contact [facility] for night-coverage after hours (4p -7a ) and weekends on [email or web address]

## 2016-11-02 NOTE — Progress Notes (Signed)
Cpap placed at pt's bedside, per RN pt states he is willing to try it tonight. QHS RT will be notified and will start cpap at bedtime for pt. Pt has never worn one before. RT will continue to monitor.

## 2016-11-02 NOTE — Progress Notes (Addendum)
ANTICOAGULATION CONSULT NOTE - Follow Up Consult  Pharmacy Consult for Heparin Indication: atrial fibrillation  Allergies  Allergen Reactions  . Aspirin Hives, Itching and Rash    Patient Measurements: Height: 6\' 3"  (190.5 cm) Weight: (!) 520 lb (235.9 kg) IBW/kg (Calculated) : 84.5 Heparin Dosing Weight:    Vital Signs: Temp: 97.8 F (36.6 C) (03/24 0616) Temp Source: Oral (03/24 0616) BP: 100/72 (03/24 0616) Pulse Rate: 123 (03/24 0616)  Labs:  Recent Labs  10/31/16 1005 10/31/16 2059  11/01/16 0013 11/01/16 0726 11/01/16 1552 11/02/16 0457  HGB 15.5  --   --  14.3  --   --  15.1  HCT 47.9  --   --  44.9  --   --  46.8  PLT 258  --   --  253  --   --  262  LABPROT  --  17.1*  --   --   --   --   --   INR  --  1.38  --   --   --   --   --   HEPARINUNFRC  --   --   < > 0.29* 0.53 0.58 0.50  CREATININE 2.02*  --   --  1.74*  --   --  2.07*  < > = values in this interval not displayed.  Estimated Creatinine Clearance: 101.3 mL/min (A) (by C-G formula based on SCr of 2.07 mg/dL (H)).   Assessment:  Anticoag: Heparin for new onset a fib. No AC PTA. CBC stable. Heparin level at goal this am on 2200 units/hr.    Plans to add warfarin per cardiology. Will watch INR closely with amiodarone. TEE/DCCV planned for Monday if still in afib  Goal of Therapy:  Heparin level 0.3-0.7 units/ml Monitor platelets by anticoagulation protocol: Yes   Plan:  Continue heparin 2200 units/hr IV Daily HL and CBC in am Warfarin 10mg  tonight  Sheppard Coil PharmD., BCPS Clinical Pharmacist Pager [phone] 11/02/2016 7:17 AM

## 2016-11-02 NOTE — Progress Notes (Signed)
Peripherally Inserted Central Catheter/Midline Placement  The IV Nurse has discussed with the patient and/or persons authorized to consent for the patient, the purpose of this procedure and the potential benefits and risks involved with this procedure.  The benefits include less needle sticks, lab draws from the catheter, and the patient may be discharged home with the catheter. Risks include, but not limited to, infection, bleeding, blood clot (thrombus formation), and puncture of an artery; nerve damage and irregular heartbeat and possibility to perform a PICC exchange if needed/ordered by physician.  Alternatives to this procedure were also discussed.  Bard Power PICC patient education guide, fact sheet on infection prevention and patient information card has been provided to patient /or left at bedside.    PICC/Midline Placement Documentation  PICC Triple Lumen 11/02/16 PICC Right Cephalic 47 cm 0 cm (Active)  Indication for Insertion or Continuance of Line Vasoactive infusions;Chronic illness with exacerbations (CF, Sickle Cell, etc.);Prolonged intravenous therapies 11/02/2016  6:36 PM  Exposed Catheter (cm) 0 cm 11/02/2016  6:36 PM  Site Assessment Clean;Dry;Intact 11/02/2016  6:36 PM  Lumen #1 Status Flushed;Saline locked;Blood return noted 11/02/2016  6:36 PM  Lumen #2 Status Flushed;Saline locked;Blood return noted 11/02/2016  6:36 PM  Lumen #3 Status Flushed;Saline locked;Blood return noted 11/02/2016  6:36 PM  Dressing Type Transparent 11/02/2016  6:36 PM  Dressing Status Clean;Dry;Intact;Antimicrobial disc in place 11/02/2016  6:36 PM  Line Care Connections checked and tightened 11/02/2016  6:36 PM  Line Adjustment (NICU/IV Team Only) Yes 11/02/2016  6:36 PM  Dressing Intervention New dressing 11/02/2016  6:36 PM  Dressing Change Due 11/09/16 11/02/2016  6:36 PM       Mark Baker 11/02/2016, 6:39 PM

## 2016-11-03 ENCOUNTER — Inpatient Hospital Stay (HOSPITAL_COMMUNITY): Payer: Managed Care, Other (non HMO)

## 2016-11-03 ENCOUNTER — Encounter (HOSPITAL_COMMUNITY): Payer: Self-pay | Admitting: Anesthesiology

## 2016-11-03 DIAGNOSIS — I509 Heart failure, unspecified: Secondary | ICD-10-CM

## 2016-11-03 LAB — CBC
HEMATOCRIT: 45.2 % (ref 39.0–52.0)
HEMOGLOBIN: 14.4 g/dL (ref 13.0–17.0)
MCH: 28.2 pg (ref 26.0–34.0)
MCHC: 31.9 g/dL (ref 30.0–36.0)
MCV: 88.6 fL (ref 78.0–100.0)
Platelets: 241 10*3/uL (ref 150–400)
RBC: 5.1 MIL/uL (ref 4.22–5.81)
RDW: 13.5 % (ref 11.5–15.5)
WBC: 11.6 10*3/uL — ABNORMAL HIGH (ref 4.0–10.5)

## 2016-11-03 LAB — ECHOCARDIOGRAM COMPLETE
CHL CUP TV REG PEAK VELOCITY: 262 cm/s
EWDT: 185 ms
Height: 75 in
LAVOLA4C: 210 mL
LVOT area: 5.31 cm2
LVOT diameter: 26 mm
MV Dec: 185
MV Peak grad: 5 mmHg
MV VTI: 90.2 cm
MV pk E vel: 111 m/s
TR max vel: 262 cm/s
Weight: 8302.4 oz

## 2016-11-03 LAB — BASIC METABOLIC PANEL
Anion gap: 13 (ref 5–15)
BUN: 39 mg/dL — AB (ref 6–20)
CHLORIDE: 97 mmol/L — AB (ref 101–111)
CO2: 28 mmol/L (ref 22–32)
Calcium: 8.9 mg/dL (ref 8.9–10.3)
Creatinine, Ser: 1.73 mg/dL — ABNORMAL HIGH (ref 0.61–1.24)
GFR calc Af Amer: 57 mL/min — ABNORMAL LOW (ref >60)
GFR calc non Af Amer: 49 mL/min — ABNORMAL LOW (ref >60)
GLUCOSE: 117 mg/dL — AB (ref 65–99)
POTASSIUM: 3.2 mmol/L — AB (ref 3.5–5.1)
Sodium: 138 mmol/L (ref 135–145)

## 2016-11-03 LAB — PROTIME-INR
INR: 1.39
Prothrombin Time: 17.2 seconds — ABNORMAL HIGH (ref 11.4–15.2)

## 2016-11-03 LAB — COOXEMETRY PANEL
CARBOXYHEMOGLOBIN: 0.8 % (ref 0.5–1.5)
METHEMOGLOBIN: 0.9 % (ref 0.0–1.5)
O2 Saturation: 38.4 %
Total hemoglobin: 15.3 g/dL (ref 12.0–16.0)

## 2016-11-03 LAB — HEPARIN LEVEL (UNFRACTIONATED): Heparin Unfractionated: 0.45 IU/mL (ref 0.30–0.70)

## 2016-11-03 LAB — MAGNESIUM: Magnesium: 2 mg/dL (ref 1.7–2.4)

## 2016-11-03 MED ORDER — AMIODARONE IV BOLUS ONLY 150 MG/100ML
150.0000 mg | Freq: Once | INTRAVENOUS | Status: AC
  Administered 2016-11-03: 150 mg via INTRAVENOUS
  Filled 2016-11-03: qty 100

## 2016-11-03 MED ORDER — SPIRONOLACTONE 25 MG PO TABS
25.0000 mg | ORAL_TABLET | Freq: Two times a day (BID) | ORAL | Status: DC
  Administered 2016-11-03 – 2016-11-14 (×23): 25 mg via ORAL
  Filled 2016-11-03 (×23): qty 1

## 2016-11-03 MED ORDER — PERFLUTREN LIPID MICROSPHERE
1.0000 mL | INTRAVENOUS | Status: AC | PRN
  Administered 2016-11-03: 3 mL via INTRAVENOUS
  Filled 2016-11-03: qty 10

## 2016-11-03 MED ORDER — WARFARIN SODIUM 10 MG PO TABS
10.0000 mg | ORAL_TABLET | Freq: Once | ORAL | Status: AC
  Administered 2016-11-03: 10 mg via ORAL
  Filled 2016-11-03: qty 1

## 2016-11-03 MED ORDER — POTASSIUM CHLORIDE CRYS ER 20 MEQ PO TBCR
40.0000 meq | EXTENDED_RELEASE_TABLET | Freq: Once | ORAL | Status: AC
  Administered 2016-11-03: 40 meq via ORAL
  Filled 2016-11-03: qty 2

## 2016-11-03 MED ORDER — POTASSIUM CHLORIDE CRYS ER 20 MEQ PO TBCR
40.0000 meq | EXTENDED_RELEASE_TABLET | Freq: Once | ORAL | Status: DC
  Filled 2016-11-03: qty 2

## 2016-11-03 MED ORDER — PERFLUTREN LIPID MICROSPHERE
INTRAVENOUS | Status: AC
  Filled 2016-11-03: qty 10

## 2016-11-03 NOTE — Progress Notes (Deleted)
Central telemetry notified RN that patient has been having frequent pauses with the longest one being 2.1 seconds. Text paged Dr. Montez Morita. Pt sleeping and is asymptomatic. Will continue to monitor.

## 2016-11-03 NOTE — Progress Notes (Signed)
Patient's HR averages 130-150s; Dr. Oletta Cohn, MD aware and has documented such as of today.  Patient given second Amiodarone bolus earlier today and remains on continuous IV gtt @ 30 mg/hr.  Patient's family is visiting @ bedside right now - wife and three small children.  HR will occasionally jump to 170-180s but immediately comes back to 140-150 range. CCMD has alerted this RN.  Pt is asymptomatic and states he cannot feel his heart racing; no SOB; no c/o CP; BP stable.  Pt also on Milrinone gtt @ 0.375 mcg/kg/min.  Improved diuresis noted w/changes made today.  Dr. Dimple Casey, [facility] cardiology fellow, made aware of patient status, HR, and no new orders given. Will continue to monitor closely

## 2016-11-03 NOTE — Progress Notes (Signed)
  Echocardiogram 2D Echocardiogram has been performed with definity.  Marisue Humble 11/03/2016, 3:56 PM

## 2016-11-03 NOTE — Anesthesia Preprocedure Evaluation (Deleted)
Anesthesia Evaluation  Patient identified by MRN, date of birth, ID band Patient awake    Reviewed: Allergy & Precautions, H&P , Patient's Chart, lab work & pertinent test results, reviewed documented beta blocker date and time   Airway Mallampati: II  TM Distance: >3 FB Neck ROM: full    Dental no notable dental hx.    Pulmonary asthma , sleep apnea ,    Pulmonary exam normal breath sounds clear to auscultation       Cardiovascular hypertension, +CHF   Rhythm:regular Rate:Normal     Neuro/Psych    GI/Hepatic   Endo/Other    Renal/GU      Musculoskeletal   Abdominal   Peds  Hematology   Anesthesia Other Findings   Reproductive/Obstetrics                             Anesthesia Physical Anesthesia Plan  ASA: III  Anesthesia Plan: MAC and General   Post-op Pain Management:    Induction:   Airway Management Planned: Video Laryngoscope Planned, LMA and Oral ETT  Additional Equipment:   Intra-op Plan:   Post-operative Plan:   Informed Consent: I have reviewed the patients History and Physical, chart, labs and discussed the procedure including the risks, benefits and alternatives for the proposed anesthesia with the patient or authorized representative who has indicated his/her understanding and acceptance.   Dental Advisory Given  Plan Discussed with: CRNA and Surgeon  Anesthesia Plan Comments: (Airway dependent anesthesia choice)        Anesthesia Quick Evaluation

## 2016-11-03 NOTE — Progress Notes (Addendum)
ANTICOAGULATION CONSULT NOTE - Follow Up Consult  Pharmacy Consult for Heparin Indication: atrial fibrillation  Allergies  Allergen Reactions  . Aspirin Hives, Itching and Rash    Patient Measurements: Height: 6\' 3"  (190.5 cm) Weight: (!) 518 lb 14.4 oz (235.4 kg) IBW/kg (Calculated) : 84.5 Heparin Dosing Weight:    Vital Signs: Temp: 97.7 F (36.5 C) (03/25 0300) Temp Source: Oral (03/25 0300) BP: 126/90 (03/25 0300) Pulse Rate: 25 (03/25 0400)  Labs:  Recent Labs  10/31/16 2059 11/01/16 0013  11/01/16 1552 11/02/16 0457 11/03/16 0358  HGB  --  14.3  --   --  15.1 14.4  HCT  --  44.9  --   --  46.8 45.2  PLT  --  253  --   --  262 241  LABPROT 17.1*  --   --   --   --  17.2*  INR 1.38  --   --   --   --  1.39  HEPARINUNFRC  --  0.29*  < > 0.58 0.50 0.45  CREATININE  --  1.74*  --   --  2.07* 1.73*  < > = values in this interval not displayed.  Estimated Creatinine Clearance: 121 mL/min (A) (by C-G formula based on SCr of 1.73 mg/dL (H)).   Assessment:  Anticoag: Heparin for new onset a fib. No AC PTA. CBC stable. Heparin level continues to be at goal this am on 2200 units/hr.    Added warfarin yesterday, INR unchanged from baseline 3 days ago. May need to hold if cath is planned. Will watch INR closely with amiodarone.  TEE/DCCV planned for Monday if still in afib.   Goal of Therapy:  INR goal 2-3 Heparin level 0.3-0.7 units/ml Monitor platelets by anticoagulation protocol: Yes   Plan:  Continue heparin 2200 units/hr IV Daily HL and CBC in am Repeat warfarin 10mg  tonight  Sheppard Coil PharmD., BCPS Clinical Pharmacist Pager [phone] 11/03/2016 7:23 AM

## 2016-11-03 NOTE — Progress Notes (Addendum)
Advanced Heart Failure Rounding Note   Subjective:    PICC placed yesterday. And moved to SDU. Milrinone started.   Co-ox this am 38%. Diuresis improving with milrinone. Creatinine down 2.1-> 1.7. On amio for AF with RVR rates still 140-150   Breathing better but still feels wean. +orthopnea. No CP. No palpitations. K 3.2    Objective:   Weight Range:  Vital Signs:   Temp:  [97 F (36.1 C)-98.4 F (36.9 C)] 97.6 F (36.4 C) (03/25 1200) Pulse Rate:  [25-132] 65 (03/25 1209) Resp:  [16-34] 34 (03/25 1209) BP: (107-159)/(88-135) 141/97 (03/25 1209) SpO2:  [90 %-98 %] 95 % (03/25 1209) Weight:  [228.4 kg (503 lb 9.6 oz)-235.4 kg (518 lb 14.4 oz)] 235.4 kg (518 lb 14.4 oz) (03/25 0441) Last BM Date: 11/01/16  Weight change: Filed Weights   11/02/16 0616 11/02/16 1623 11/03/16 0441  Weight: (!) 235.9 kg (520 lb) (!) 228.4 kg (503 lb 9.6 oz) (!) 235.4 kg (518 lb 14.4 oz)    Intake/Output:   Intake/Output Summary (Last 24 hours) at 11/03/16 1356 Last data filed at 11/03/16 1300  Gross per 24 hour  Intake          2434.15 ml  Output             5650 ml  Net         -3215.85 ml     Physical Exam: General:  Markedly obese male Sitting in chair NAD HEENT: normal anicteric Neck: supple. JVP unable to see due to thick necx . Carotids 2+ bilat; no bruits. No lymphadenopathy or thryomegaly appreciated. Cor: PMI nonpalpable  Irregular rate & rhythm. Tachy  Distant. No obvious murmur or gallop Lungs: decreased throughout no wheeze Abdomen: markedly  obese, soft, nontender, +distended . Unable to assess for hepatosplenomegaly. No bruits or masses. Good bowel sounds. Extremities: no cyanosis, clubbing, rash, 2+ woody edema TED hose in place Neuro: alert & orientedx3, cranial nerves grossly intact. moves all 4 extremities w/o difficulty. Affect pleasant   Telemetry: AF 13-150. Personally reviewed    Labs: Basic Metabolic Panel:  Recent Labs Lab 10/31/16 1005  11/01/16 0013 11/02/16 0457 11/03/16 0358  NA 135 137 135 138  K 3.9 3.7 3.8 3.2*  CL 98* 99* 97* 97*  CO2 24 26 23 28   GLUCOSE 127* 107* 123* 117*  BUN 34* 33* 44* 39*  CREATININE 2.02* 1.74* 2.07* 1.73*  CALCIUM 8.9 8.8* 9.2 8.9  MG  --  1.9  --  2.0    Liver Function Tests:  Recent Labs Lab 10/31/16 1005  AST 47*  ALT 55  ALKPHOS 50  BILITOT 3.4*  PROT 6.4*  ALBUMIN 3.8   No results for input(s): LIPASE, AMYLASE in the last 168 hours. No results for input(s): AMMONIA in the last 168 hours.  CBC:  Recent Labs Lab 10/31/16 1005 11/01/16 0013 11/02/16 0457 11/03/16 0358  WBC 10.3 11.0* 13.3* 11.6*  HGB 15.5 14.3 15.1 14.4  HCT 47.9 44.9 46.8 45.2  MCV 88.9 88.6 88.6 88.6  PLT 258 253 262 241    Cardiac Enzymes: No results for input(s): CKTOTAL, CKMB, CKMBINDEX, TROPONINI in the last 168 hours.  BNP: BNP (last 3 results)  Recent Labs  07/05/16 1549 10/31/16 1005  BNP 327.3* 831.9*    ProBNP (last 3 results) No results for input(s): PROBNP in the last 8760 hours.    Other results:  Imaging: Dg Chest Port 1 View  Result Date: 11/02/2016 CLINICAL  DATA:  Evaluate left-sided PICC line. EXAM: PORTABLE CHEST 1 VIEW COMPARISON:  October 31, 2016 FINDINGS: Marked cardiomegaly. No pneumothorax. There is a right-sided PICC line. It is difficult to see the central tip but it extends at least into the central SVC. Whether it extends into the right atrium is unclear. Pulmonary venous congestion without overt edema remains. IMPRESSION: 1. New right PICC line. It is difficult to see the distal tip but it extends at least into the central SVC. The location of the tip relative to the right atrium is unclear. 2. Cardiomegaly and pulmonary venous congestion. Electronically Signed   By: Gerome Sam III M.D   On: 11/02/2016 18:52     Medications:     Scheduled Medications: . atorvastatin  40 mg Oral Daily  . furosemide  120 mg Intravenous BID  . potassium  chloride  40 mEq Oral Daily  . sodium chloride flush  10-40 mL Intracatheter Q12H  . sodium chloride flush  3 mL Intravenous Q12H  . warfarin  10 mg Oral ONCE-1800  . Warfarin - Pharmacist Dosing Inpatient   Does not apply q1800    Infusions: . [START ON 11/04/2016] sodium chloride    . amiodarone 30 mg/hr (11/03/16 0600)  . heparin 2,200 Units/hr (11/03/16 0600)  . milrinone 0.25 mcg/kg/min (11/03/16 1041)    PRN Medications: sodium chloride, acetaminophen, sodium chloride flush, sodium chloride flush   Assessment/Plan:   1. 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 C/w cardiogenic shock physiology despite milrinone 0.25. Will increase to 0.375.  - Continue IV lasix. Supp electrolytes as needed. Start spiro.  Can add metolazone as needed  - Repeat echo pending. I have ordered.  - Hold ACE & b-blocker, No spiro/Ace due to CKD. He has tried entresto in the past and developed a cough. Refuses to re-challenge.  2. A fib RVR - Intolerant lopressor. Discussed with EP. Not candidate for ablation  - Rate remains elevated with amio.  - Continue amio drip. Will rebolus today - Continue heparin drip with plan for coumadin. Too big for NOAC.  - Will eventually need TEE/DC-CV when more stable 3. 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 Improving with milrinone and diuresis 4. OSA - needs CPAP at night. He will need outpatient follow up.  5. Super morbid obesity - needs weight loss.  6. Noncompliance - Will need paramedicine on d/c 7. Hypokalemia - add spiro. Supp K.   Discussed with him and his family.   Length of Stay: 3  Bensimhon, Daniel MD 11/03/2016, 1:56 PM  Advanced Heart Failure Team Pager [phone] (M-F; 7a - 4p)  Please contact [facility] for night-coverage after hours (4p -7a ) and weekends on [email or web address]

## 2016-11-03 NOTE — Progress Notes (Signed)
Patient states he is able to place self on and off of CPAP when ready. RT filled water chamber with sterile water.

## 2016-11-04 ENCOUNTER — Inpatient Hospital Stay (HOSPITAL_COMMUNITY): Payer: Managed Care, Other (non HMO)

## 2016-11-04 ENCOUNTER — Encounter (HOSPITAL_COMMUNITY)
Admission: EM | Disposition: A | Discharge status: Home / Self Care | Payer: Self-pay | Source: Home / Self Care | Attending: Internal Medicine

## 2016-11-04 LAB — BASIC METABOLIC PANEL
Anion gap: 12 (ref 5–15)
BUN: 29 mg/dL — AB (ref 6–20)
CALCIUM: 9 mg/dL (ref 8.9–10.3)
CO2: 30 mmol/L (ref 22–32)
CREATININE: 1.44 mg/dL — AB (ref 0.61–1.24)
Chloride: 96 mmol/L — ABNORMAL LOW (ref 101–111)
GFR calc Af Amer: >60 mL/min (ref >60)
GLUCOSE: 126 mg/dL — AB (ref 65–99)
POTASSIUM: 3.1 mmol/L — AB (ref 3.5–5.1)
SODIUM: 138 mmol/L (ref 135–145)

## 2016-11-04 LAB — PROTIME-INR
INR: 1.35
PROTHROMBIN TIME: 16.8 s — AB (ref 11.4–15.2)

## 2016-11-04 LAB — CBC
HCT: 45.1 % (ref 39.0–52.0)
HEMOGLOBIN: 14.5 g/dL (ref 13.0–17.0)
MCH: 28.5 pg (ref 26.0–34.0)
MCHC: 32.2 g/dL (ref 30.0–36.0)
MCV: 88.6 fL (ref 78.0–100.0)
Platelets: 245 10*3/uL (ref 150–400)
RBC: 5.09 MIL/uL (ref 4.22–5.81)
RDW: 13.3 % (ref 11.5–15.5)
WBC: 11.1 10*3/uL — ABNORMAL HIGH (ref 4.0–10.5)

## 2016-11-04 LAB — COOXEMETRY PANEL
Carboxyhemoglobin: 1.4 % (ref 0.5–1.5)
Methemoglobin: 0.6 % (ref 0.0–1.5)
O2 Saturation: 73.2 %
TOTAL HEMOGLOBIN: 14.8 g/dL (ref 12.0–16.0)

## 2016-11-04 LAB — HEPARIN LEVEL (UNFRACTIONATED): HEPARIN UNFRACTIONATED: 0.36 [IU]/mL (ref 0.30–0.70)

## 2016-11-04 SURGERY — ECHOCARDIOGRAM, TRANSESOPHAGEAL
Anesthesia: Monitor Anesthesia Care

## 2016-11-04 MED ORDER — WARFARIN SODIUM 2.5 MG PO TABS
12.5000 mg | ORAL_TABLET | Freq: Once | ORAL | Status: AC
  Administered 2016-11-04: 12.5 mg via ORAL
  Filled 2016-11-04: qty 1

## 2016-11-04 MED ORDER — POTASSIUM CHLORIDE CRYS ER 20 MEQ PO TBCR
80.0000 meq | EXTENDED_RELEASE_TABLET | Freq: Once | ORAL | Status: AC
  Administered 2016-11-04: 80 meq via ORAL
  Filled 2016-11-04: qty 4

## 2016-11-04 NOTE — Progress Notes (Signed)
Advanced Heart Failure Rounding Note   Subjective:    PICC placed 11/02/16 and moved to SDU. Milrinone started.   Coox 38% 11/03/16. Milrinone increased.  Coox 73.2% this am on milrinone 0.375 mcg/kg/min.  Remains in AF with RVR in 140-150s despite amio gtt.   Creatinine 2.1 -> 1.7 -> 1.4.   Feeling better this am. Marked urine output noted. Slept in recliner, which he does chronically. Denies CP or SOB currently.  No CP. Feeling better. Appetite ok. BP stable.   Out 4.4 L and weight down 9 lbs on Lasix 120 mg BID.     Objective:   Weight Range:  Vital Signs:   Temp:  [97.6 F (36.4 C)-98.8 F (37.1 C)] 98.5 F (36.9 C) (03/26 0753) Pulse Rate:  [65-147] 142 (03/26 0753) Resp:  [20-34] 24 (03/26 0753) BP: (126-153)/(83-105) 126/102 (03/26 0753) SpO2:  [92 %-99 %] 92 % (03/26 0753) Weight:  [509 lb 1.6 oz (230.9 kg)] 509 lb 1.6 oz (230.9 kg) (03/26 0355) Last BM Date: 11/01/16  Weight change: Filed Weights   11/02/16 1623 11/03/16 0441 11/04/16 0355  Weight: (!) 503 lb 9.6 oz (228.4 kg) (!) 518 lb 14.4 oz (235.4 kg) (!) 509 lb 1.6 oz (230.9 kg)    Intake/Output:   Intake/Output Summary (Last 24 hours) at 11/04/16 0910 Last data filed at 11/04/16 0400  Gross per 24 hour  Intake           1915.9 ml  Output             6225 ml  Net          -4309.1 ml     Physical Exam: General: Markedly obese. Seated in chair. NAD.  HEENT: Normal  Neck: Supple. JVP unable to see due to body habitus. Carotids 2+ bilat; no bruits. No thyromegaly or nodule noted.  Cor: PMI Non-palpable. IRR, IRR tachy. Distant HS.  No obvious murmur or gallop.  Lungs: Diminished throughout. No wheeze  Abdomen: Markedly obese, soft, NT, mild to mod distention. Unable to assess for HSM. +BS  Extremities: No cyanosis, clubbing, or rash.  3+ edema    Neuro: Alert & oriented x 3. Cranial nerves grossly intact. Moves all 4 extremities w/o difficulty. Affect pleasant  No asterixis  Telemetry: AF  130-150, Personally reviewed   Labs: Basic Metabolic Panel:  Recent Labs Lab 10/31/16 1005 11/01/16 0013 11/02/16 0457 11/03/16 0358 11/04/16 0130  NA 135 137 135 138 138  K 3.9 3.7 3.8 3.2* 3.1*  CL 98* 99* 97* 97* 96*  CO2 24 26 23 28 30   GLUCOSE 127* 107* 123* 117* 126*  BUN 34* 33* 44* 39* 29*  CREATININE 2.02* 1.74* 2.07* 1.73* 1.44*  CALCIUM 8.9 8.8* 9.2 8.9 9.0  MG  --  1.9  --  2.0  --     Liver Function Tests:  Recent Labs Lab 10/31/16 1005  AST 47*  ALT 55  ALKPHOS 50  BILITOT 3.4*  PROT 6.4*  ALBUMIN 3.8   No results for input(s): LIPASE, AMYLASE in the last 168 hours. No results for input(s): AMMONIA in the last 168 hours.  CBC:  Recent Labs Lab 10/31/16 1005 11/01/16 0013 11/02/16 0457 11/03/16 0358 11/04/16 0130  WBC 10.3 11.0* 13.3* 11.6* 11.1*  HGB 15.5 14.3 15.1 14.4 14.5  HCT 47.9 44.9 46.8 45.2 45.1  MCV 88.9 88.6 88.6 88.6 88.6  PLT 258 253 262 241 245    Cardiac Enzymes: No results for input(s):  CKTOTAL, CKMB, CKMBINDEX, TROPONINI in the last 168 hours.  BNP: BNP (last 3 results)  Recent Labs  07/05/16 1549 10/31/16 1005  BNP 327.3* 831.9*    ProBNP (last 3 results) No results for input(s): PROBNP in the last 8760 hours.    Other results:  Imaging: Dg Chest Port 1 View  Result Date: 11/02/2016 CLINICAL DATA:  Evaluate left-sided PICC line. EXAM: PORTABLE CHEST 1 VIEW COMPARISON:  October 31, 2016 FINDINGS: Marked cardiomegaly. No pneumothorax. There is a right-sided PICC line. It is difficult to see the central tip but it extends at least into the central SVC. Whether it extends into the right atrium is unclear. Pulmonary venous congestion without overt edema remains. IMPRESSION: 1. New right PICC line. It is difficult to see the distal tip but it extends at least into the central SVC. The location of the tip relative to the right atrium is unclear. 2. Cardiomegaly and pulmonary venous congestion. Electronically Signed    By: Gerome Sam III M.D   On: 11/02/2016 18:52     Medications:     Scheduled Medications: . atorvastatin  40 mg Oral Daily  . furosemide  120 mg Intravenous BID  . potassium chloride  40 mEq Oral Daily  . potassium chloride  40 mEq Oral Once  . sodium chloride flush  10-40 mL Intracatheter Q12H  . sodium chloride flush  3 mL Intravenous Q12H  . spironolactone  25 mg Oral BID  . Warfarin - Pharmacist Dosing Inpatient   Does not apply q1800    Infusions: . sodium chloride 20 mL/hr at 11/04/16 0400  . amiodarone 30 mg/hr (11/04/16 0400)  . heparin 2,200 Units/hr (11/04/16 0400)  . milrinone 0.375 mcg/kg/min (11/04/16 0730)    PRN Medications: sodium chloride, acetaminophen, sodium chloride flush, sodium chloride flush   Assessment/Plan:   1. 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  - Continue IV lasix 120 mg BID. - Continue spiro 25 mg BID for now.   - Repeat ECHO pending.   - Hold ACE & BB.  - No ACE for now with recent AKI.  - Has previously had cough on Entresto. Refuses to re-challenge.   2. A fib RVR - Not candidate for ablation. Have previously discussed with EP.   - Intolerant to Lopressor.  - Rates remain poorly controlled despite amio. Will focus on diuresis for now. - Continue heparin. Will need coumadin. No data for NOACs in his weight category. - Will need TEE/DC-CV once stable. Deferred today with decompensation yesterday.  3. Acute/CKD Stage III - Creatinine back to baseline. Continue to follow closely with inotropes and diuresis.   4. OSA - Needs CPAP at night. Will need outpatient follow up.  5. Super morbid obesity - Needs to lose significant amount of weight.  6. Noncompliance - Will plan on paramedicine at discharge.  7. Hypokalemia - Agree with K supp ordered overnight. Continue spiro.    Hold course for today with  improved diuresis. Will plan TEE/DCCV once more stable.   Length of Stay: 4  Luane School  11/04/2016, 9:10 AM  Advanced Heart Failure Team Pager [phone] (M-F; 7a - 4p)  Please contact [facility] for night-coverage after hours (4p -7a ) and weekends on [email or web address]  Patient seen and examined with Otilio Saber, PA-C. We discussed all aspects of the encounter. I agree with the assessment  and plan as stated above.   Diuresing well. Still with marked volume overload.   Co-ox much improved with milrinone.   Will continue milrinone and IV lasix. Supp electrolytes.  AF remains fast. Will continue heparin and amio. Can rebolus as needed. Will plan TEE and DC-CV prior to d/c.  Will repeat echo today. Will liekly need RHC prior to d/c.   Needs paramedicine at d/c.   [name], MD  9:58 AM

## 2016-11-04 NOTE — Progress Notes (Signed)
Patient states he may not wear CPAP tonight but is able to place self on and off when ready.

## 2016-11-04 NOTE — Progress Notes (Signed)
ANTICOAGULATION CONSULT NOTE - Follow Up Consult  Pharmacy Consult for Heparin Indication: atrial fibrillation  Allergies  Allergen Reactions  . Aspirin Hives, Itching and Rash    Patient Measurements: Height: 6\' 3"  (190.5 cm) Weight: (!) 509 lb 1.6 oz (230.9 kg) IBW/kg (Calculated) : 84.5 Heparin Dosing Weight:    Vital Signs: Temp: 98.5 F (36.9 C) (03/26 0753) Temp Source: Oral (03/26 0753) BP: 126/102 (03/26 0753) Pulse Rate: 142 (03/26 0753)  Labs:  Recent Labs  11/02/16 0457 11/03/16 0358 11/04/16 0130 11/04/16 0131  HGB 15.1 14.4 14.5  --   HCT 46.8 45.2 45.1  --   PLT 262 241 245  --   LABPROT  --  17.2*  --  16.8*  INR  --  1.39  --  1.35  HEPARINUNFRC 0.50 0.45  --  0.36  CREATININE 2.07* 1.73* 1.44*  --     Estimated Creatinine Clearance: 143.5 mL/min (A) (by C-G formula based on SCr of 1.44 mg/dL (H)).   Assessment:  Anticoag: Heparin for new onset a fib. No AC PTA. CBC stable. Heparin level 0.36 continues to be at goal this am on 2200 units/hr.    Added warfarin, INR slow to increase 1.35. Will watch INR closely with amiodarone.  TEE/DCCV once patient volume status improved.   Goal of Therapy:  INR goal 2-3 Heparin level 0.3-0.7 units/ml Monitor platelets by anticoagulation protocol: Yes   Plan:  Continue heparin 2200 units/hr IV Daily HL and CBC in am Warfarin 12.5 mg tonight   Leota Sauers Pharm.D. CPP, BCPS Clinical Pharmacist [phone] 11/04/2016 9:21 AM

## 2016-11-04 NOTE — Progress Notes (Signed)
Pt w/K+ 3.1 - paged Dr. Dimple Casey, Cardiology Fellow for [facility] overnight, and received new order for KCL PO.  Verified OK to give per Barkley Bruns, Pharmacist. Pt requested KCL be crushed in applesauce which is how it was administered.  Otherwise maintains NPO status for TEE/Cardioversion in AM.

## 2016-11-04 NOTE — Progress Notes (Signed)
Pt w/5 beats wide QRS - notified by CCMD and reviewed on telemetry per RN.  AM labs drawn early to ensure electrolytes are stable (K+ 3.2 yesterday AM and treated w/KCL PO yesterday).  Will await results.  Patient w/VSS; HR remains elevated in 140-150s; pt denies palpitation, SOB, chest pain.  Will continue to monitor closely.

## 2016-11-05 ENCOUNTER — Telehealth (HOSPITAL_COMMUNITY): Payer: Self-pay | Admitting: Surgery

## 2016-11-05 LAB — BASIC METABOLIC PANEL
Anion gap: 12 (ref 5–15)
BUN: 23 mg/dL — AB (ref 6–20)
CHLORIDE: 90 mmol/L — AB (ref 101–111)
CO2: 30 mmol/L (ref 22–32)
Calcium: 8.8 mg/dL — ABNORMAL LOW (ref 8.9–10.3)
Creatinine, Ser: 1.49 mg/dL — ABNORMAL HIGH (ref 0.61–1.24)
GFR calc Af Amer: >60 mL/min (ref >60)
GFR calc non Af Amer: 59 mL/min — ABNORMAL LOW (ref >60)
Glucose, Bld: 274 mg/dL — ABNORMAL HIGH (ref 65–99)
POTASSIUM: 3 mmol/L — AB (ref 3.5–5.1)
SODIUM: 132 mmol/L — AB (ref 135–145)

## 2016-11-05 LAB — CBC
HEMATOCRIT: 45.4 % (ref 39.0–52.0)
HEMOGLOBIN: 14.4 g/dL (ref 13.0–17.0)
MCH: 28.4 pg (ref 26.0–34.0)
MCHC: 31.7 g/dL (ref 30.0–36.0)
MCV: 89.5 fL (ref 78.0–100.0)
Platelets: 235 10*3/uL (ref 150–400)
RBC: 5.07 MIL/uL (ref 4.22–5.81)
RDW: 13.9 % (ref 11.5–15.5)
WBC: 11 10*3/uL — ABNORMAL HIGH (ref 4.0–10.5)

## 2016-11-05 LAB — PROTIME-INR
INR: 1.96
INR: 2.16
PROTHROMBIN TIME: 22.6 s — AB (ref 11.4–15.2)
PROTHROMBIN TIME: 24.5 s — AB (ref 11.4–15.2)

## 2016-11-05 LAB — COOXEMETRY PANEL
Carboxyhemoglobin: 1.3 % (ref 0.5–1.5)
Methemoglobin: 0.8 % (ref 0.0–1.5)
O2 SAT: 62.4 %
Total hemoglobin: 14.6 g/dL (ref 12.0–16.0)

## 2016-11-05 LAB — HEPARIN LEVEL (UNFRACTIONATED): HEPARIN UNFRACTIONATED: 0.31 [IU]/mL (ref 0.30–0.70)

## 2016-11-05 MED ORDER — METOLAZONE 5 MG PO TABS
5.0000 mg | ORAL_TABLET | Freq: Once | ORAL | Status: AC
  Administered 2016-11-05: 5 mg via ORAL
  Filled 2016-11-05: qty 1

## 2016-11-05 MED ORDER — DIGOXIN 250 MCG PO TABS
0.2500 mg | ORAL_TABLET | Freq: Every day | ORAL | Status: DC
  Administered 2016-11-05 – 2016-11-14 (×10): 0.25 mg via ORAL
  Filled 2016-11-05 (×10): qty 1

## 2016-11-05 MED ORDER — POTASSIUM CHLORIDE CRYS ER 20 MEQ PO TBCR
60.0000 meq | EXTENDED_RELEASE_TABLET | Freq: Three times a day (TID) | ORAL | Status: DC
  Administered 2016-11-05 – 2016-11-06 (×5): 60 meq via ORAL
  Filled 2016-11-05 (×4): qty 3

## 2016-11-05 NOTE — Progress Notes (Signed)
ANTICOAGULATION CONSULT NOTE - Follow Up Consult  Pharmacy Consult for Heparin> warfarin Indication: atrial fibrillation  Allergies  Allergen Reactions  . Aspirin Hives, Itching and Rash    Patient Measurements: Height: 6\' 3"  (190.5 cm) Weight: (!) 506 lb 6.4 oz (229.7 kg) IBW/kg (Calculated) : 84.5 Heparin Dosing Weight:    Vital Signs: Temp: 98.2 F (36.8 C) (03/27 0700) Temp Source: Oral (03/27 0700) BP: 115/82 (03/27 0700) Pulse Rate: 86 (03/27 0700)  Labs:  Recent Labs  11/03/16 0358 11/04/16 0130 11/04/16 0131 11/05/16 0434 11/05/16 0809  HGB 14.4 14.5  --  14.4  --   HCT 45.2 45.1  --  45.4  --   PLT 241 245  --  235  --   LABPROT 17.2*  --  16.8* 22.6* 24.5*  INR 1.39  --  1.35 1.96 2.16  HEPARINUNFRC 0.45  --  0.36 0.31  --   CREATININE 1.73* 1.44*  --  1.49*  --     Estimated Creatinine Clearance: 138.2 mL/min (A) (by C-G formula based on SCr of 1.49 mg/dL (H)).   Assessment:  Anticoag: Heparin for new onset a fib. No AC PTA. CBC stable. Heparin level 0.31 continues to be at goal this am on 2200 units/hr.    Added warfarin, INR took big jump after dose last pm 1.35> 1.95 recheck for accuracy = 2.1 hold warfarin tonight reassess in am Also new amiodarone.  TEE/DCCV once patient volume status improved.   Goal of Therapy:  INR goal 2-3 Heparin level 0.3-0.7 units/ml Monitor platelets by anticoagulation protocol: Yes   Plan:  Continue heparin 2200 units/hr IV Daily HL and CBC in am Hold Warfarin  tonight   Leota Sauers Pharm.D. CPP, BCPS Clinical Pharmacist [phone] 11/05/2016 10:33 AM

## 2016-11-05 NOTE — Care Management (Signed)
Sleep study order (required for CPAP in the home)  form placed on Shadow Chart Raynald Blend, RN, BSN [phone]

## 2016-11-05 NOTE — Progress Notes (Signed)
Advanced Heart Failure Rounding Note   Subjective:    PICC placed 11/02/16 and moved to SDU. Milrinone started.   Coox 38% 11/03/16. Milrinone increased.  Coox 62.4% this am on milrinone 0.375 mcg/kg/min. CVP 15-16 (checked personally)   Denies CP, tachypalpitations, lightheadedness, or dizziness. Sleeping upright in recliner with chronic orthopnea. Legs remain very swollen. Urine output picking up. Remains in AF with RVR despite amio. Denies palpitations or dizziness.    Echo 11/03/16 LVEF 20-25%. Valves poorly visualized.  Creatinine 2.1 -> 1.7 -> 1.4 -> 1.49.  K 3.0  Weight down 3 lbs on lasix 120 mg BID.   Objective:   Weight Range:  Vital Signs:   Temp:  [97.6 F (36.4 C)-98.9 F (37.2 C)] 98.2 F (36.8 C) (03/27 0700) Pulse Rate:  [39-157] 86 (03/27 0700) BP: (115-144)/(82-99) 115/82 (03/27 0700) SpO2:  [85 %-95 %] 93 % (03/27 0700) Weight:  [506 lb 6.4 oz (229.7 kg)] 506 lb 6.4 oz (229.7 kg) (03/27 0430) Last BM Date: 11/04/16  Weight change: Filed Weights   11/03/16 0441 11/04/16 0355 11/05/16 0430  Weight: (!) 518 lb 14.4 oz (235.4 kg) (!) 509 lb 1.6 oz (230.9 kg) (!) 506 lb 6.4 oz (229.7 kg)    Intake/Output:   Intake/Output Summary (Last 24 hours) at 11/05/16 0930 Last data filed at 11/05/16 0600  Gross per 24 hour  Intake           2926.3 ml  Output             4200 ml  Net          -1273.7 ml     Physical Exam: General: markedly obese. Seated in chair upright. NAD.   HEENT: Normal anicteric Neck: Supple. JVP cant assess due to body habitus. Carotids 2+ bilat; no bruits. No thyromegaly or nodule noted.  Cor: PMI non-palpable. IIRR very tachy  Distant HS. No obvious murmur or gallop.         Lungs: Distant and diminished throughout.   No wheeze Abdomen: Markedly obese, soft, NT, mild distention, no obvious HSM. No bruits or masses. +BS Extremities: No cyanosis, clubbing, or rash. Chronic 2-3 + edema. TED hose in place Neuro: Alert & oriented x 3.  Cranial nerves grossly intact. Moves all 4 extremities w/o difficulty. Affect pleasant  No asterixis   Telemetry: Personally reviewed, AF 130-150s   Labs: Basic Metabolic Panel:  Recent Labs Lab 11/01/16 0013 11/02/16 0457 11/03/16 0358 11/04/16 0130 11/05/16 0434  NA 137 135 138 138 132*  K 3.7 3.8 3.2* 3.1* 3.0*  CL 99* 97* 97* 96* 90*  CO2 26 23 28 30 30   GLUCOSE 107* 123* 117* 126* 274*  BUN 33* 44* 39* 29* 23*  CREATININE 1.74* 2.07* 1.73* 1.44* 1.49*  CALCIUM 8.8* 9.2 8.9 9.0 8.8*  MG 1.9  --  2.0  --   --     Liver Function Tests:  Recent Labs Lab 10/31/16 1005  AST 47*  ALT 55  ALKPHOS 50  BILITOT 3.4*  PROT 6.4*  ALBUMIN 3.8   No results for input(s): LIPASE, AMYLASE in the last 168 hours. No results for input(s): AMMONIA in the last 168 hours.  CBC:  Recent Labs Lab 11/01/16 0013 11/02/16 0457 11/03/16 0358 11/04/16 0130 11/05/16 0434  WBC 11.0* 13.3* 11.6* 11.1* 11.0*  HGB 14.3 15.1 14.4 14.5 14.4  HCT 44.9 46.8 45.2 45.1 45.4  MCV 88.6 88.6 88.6 88.6 89.5  PLT 253 262 241 245  235    Cardiac Enzymes: No results for input(s): CKTOTAL, CKMB, CKMBINDEX, TROPONINI in the last 168 hours.  BNP: BNP (last 3 results)  Recent Labs  07/05/16 1549 10/31/16 1005  BNP 327.3* 831.9*    ProBNP (last 3 results) No results for input(s): PROBNP in the last 8760 hours.    Other results:  Imaging: No results found.   Medications:     Scheduled Medications: . atorvastatin  40 mg Oral Daily  . furosemide  120 mg Intravenous BID  . potassium chloride  40 mEq Oral Daily  . potassium chloride  40 mEq Oral Once  . sodium chloride flush  10-40 mL Intracatheter Q12H  . sodium chloride flush  3 mL Intravenous Q12H  . spironolactone  25 mg Oral BID  . Warfarin - Pharmacist Dosing Inpatient   Does not apply q1800    Infusions: . sodium chloride 20 mL/hr at 11/04/16 2000  . amiodarone 30 mg/hr (11/05/16 0424)  . heparin 2,200 Units/hr  (11/05/16 0424)  . milrinone 0.375 mcg/kg/min (11/05/16 0424)    PRN Medications: sodium chloride, acetaminophen, sodium chloride flush, sodium chloride flush   Assessment/Plan:   1. 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 Valves poorly visualized. - Coox 62.4% on milrinone 0.375 mcg/kg/min.  - Continue IV lasix 120 mg BID.  - Continue spiro 25 mg BID.  - Hold ACE & BB.  - No ACE for now with recent AKI.  - Has previously had cough on Entresto. Refuses to re-challenge.   2. A fib RVR - Remains in RVR with rates as high as 150-160s  - Have discussed with EP, not candidate for ablation - TEE-DCCV may be very difficult with pt size and chronic, significant orthopnea.  - Intolerant to Lopressor.  - Rates remain poorly controlled despite amio. Will focus on diuresis for now. - Continue heparin. Will need coumadin. No data for NOACs in his weight category. 3. Acute/CKD Stage III - Creatinine near baseline and holding on inotrope support and with diuresis.    4. OSA - Needs nightly CPAP.   - Will need outpatient follow up.  5. Super morbid obesity - Needs to focus on losing weight as outpatient.   6. Noncompliance - Will arrange paramedicine on discharge.  7. Hypokalemia - Increase K supp.  - Continue spiro 25 mg BID     Pts rate remains markedly uncontrolled.  Amiodarone does not seem to have provided much benefit thus far.  Will discuss best course with MD. Requires further diuresis.  CVP 15-16 this am.   Length of Stay: [address]  [place]aciella Fre[place], [place]  11/05/2016, 9:30 AM  Advanced Heart Failure Team Pager [phone] (M-F; 7a - 4p)  Please contact [facility] for night-coverage after hours (4p -7a ) and weekends on [email or web address]   Patient seen and examined with Otilio Saber, PA-C. We discussed all aspects of the encounter. I agree with the assessment and plan as stated above.     Cardiac output much improved on milrinone. Diuresis now picking up. Down 12 pounds in 2 days. Suspect he has much more to go. Will continue IV lasix and milrinone. Can add metolazone as needed. Supp electrolytes.   His rhythm remains a major issue. Remains in AF with RVR and rates close to 150. Not responding to IV amio. Will add digoxin. Will continue heparin and amio. Plan attempt at [facility] when more stable.  Case Managers working on outpatient sleep study.   [name], MD  11:00 PM

## 2016-11-05 NOTE — Progress Notes (Signed)
I stopped in to see patient.  He was fast asleep therefore I will plan to return to do education regarding HF recommendations for home and discuss Paramedicine referral.

## 2016-11-05 NOTE — Progress Notes (Signed)
Patient able to place self on and off of CPAP when ready. 

## 2016-11-06 LAB — BASIC METABOLIC PANEL
ANION GAP: 13 (ref 5–15)
BUN: 22 mg/dL — AB (ref 6–20)
CALCIUM: 9.4 mg/dL (ref 8.9–10.3)
CO2: 32 mmol/L (ref 22–32)
CREATININE: 1.49 mg/dL — AB (ref 0.61–1.24)
Chloride: 91 mmol/L — ABNORMAL LOW (ref 101–111)
GFR calc Af Amer: >60 mL/min (ref >60)
GFR calc non Af Amer: 59 mL/min — ABNORMAL LOW (ref >60)
GLUCOSE: 125 mg/dL — AB (ref 65–99)
Potassium: 4 mmol/L (ref 3.5–5.1)
Sodium: 136 mmol/L (ref 135–145)

## 2016-11-06 LAB — PROTIME-INR
INR: 1.92
Prothrombin Time: 22.2 seconds — ABNORMAL HIGH (ref 11.4–15.2)

## 2016-11-06 LAB — COOXEMETRY PANEL
CARBOXYHEMOGLOBIN: 1.8 % — AB (ref 0.5–1.5)
Methemoglobin: 0.6 % (ref 0.0–1.5)
O2 Saturation: 68.2 %
Total hemoglobin: 15.3 g/dL (ref 12.0–16.0)

## 2016-11-06 LAB — CBC
HEMATOCRIT: 46 % (ref 39.0–52.0)
Hemoglobin: 14.8 g/dL (ref 13.0–17.0)
MCH: 28.4 pg (ref 26.0–34.0)
MCHC: 32.2 g/dL (ref 30.0–36.0)
MCV: 88.3 fL (ref 78.0–100.0)
Platelets: 229 10*3/uL (ref 150–400)
RBC: 5.21 MIL/uL (ref 4.22–5.81)
RDW: 13.7 % (ref 11.5–15.5)
WBC: 10.2 10*3/uL (ref 4.0–10.5)

## 2016-11-06 LAB — HEPARIN LEVEL (UNFRACTIONATED): HEPARIN UNFRACTIONATED: 0.53 [IU]/mL (ref 0.30–0.70)

## 2016-11-06 MED ORDER — SODIUM CHLORIDE 0.9% FLUSH
3.0000 mL | Freq: Two times a day (BID) | INTRAVENOUS | Status: DC
  Administered 2016-11-06 – 2016-11-07 (×3): 3 mL via INTRAVENOUS
  Administered 2016-11-08: 10:00:00 via INTRAVENOUS
  Administered 2016-11-10 – 2016-11-13 (×7): 3 mL via INTRAVENOUS

## 2016-11-06 MED ORDER — SODIUM CHLORIDE 0.9 % IV SOLN
250.0000 mL | INTRAVENOUS | Status: DC
  Administered 2016-11-07: 250 mL via INTRAVENOUS

## 2016-11-06 MED ORDER — METOLAZONE 5 MG PO TABS
5.0000 mg | ORAL_TABLET | Freq: Once | ORAL | Status: AC
  Administered 2016-11-06: 5 mg via ORAL
  Filled 2016-11-06: qty 1

## 2016-11-06 MED ORDER — WARFARIN SODIUM 7.5 MG PO TABS
7.5000 mg | ORAL_TABLET | Freq: Once | ORAL | Status: AC
  Administered 2016-11-06: 7.5 mg via ORAL
  Filled 2016-11-06 (×2): qty 1

## 2016-11-06 MED ORDER — SODIUM CHLORIDE 0.9% FLUSH: 3.0000 mL | INTRAVENOUS | Status: DC | PRN

## 2016-11-06 NOTE — Care Management Note (Addendum)
Case Management Note  Patient Details  Name: Mark Baker MRN: [id number] Date of Birth: Aug 23, 1980  Subjective/Objective:        Pt admitted with HF            Action/Plan:   PTA pt completely independent from home with wife.  Pt is more than 500lbs but completely self sufficient with ADLs and mobility.  Pt will need CPAP at discharge - last sleep study completed in 2014.  CM provided sleep study order on shadow chart and requested attending to complete.  CM also contacted [facility] for referral for DME - pt has agreed to pay the $125 per month to be able to take equipment home on day of discharge.  Per [facility] this will be the required copay per month until sleep study is complete.  CM will continue to follow for discharge needs   Expected Discharge Date:                  Expected Discharge Plan:  Home/Self Care  In-House Referral:     Discharge planning Services  CM Consult  Post Acute Care Choice:    Choice offered to:     DME Arranged:    DME Agency:     HH Arranged:    HH Agency:     Status of Service:  In process, will continue to follow  If discussed at Long Length of Stay Meetings, dates discussed:    Additional Comments:  Cherylann Parr, RN 11/06/2016, 11:14 AM

## 2016-11-06 NOTE — Telephone Encounter (Signed)
Opened in error--see inpatient note

## 2016-11-06 NOTE — Progress Notes (Signed)
ANTICOAGULATION CONSULT NOTE - Follow Up Consult  Pharmacy Consult for Heparin and Coumadin Indication: atrial fibrillation  Allergies  Allergen Reactions  . Aspirin Hives, Itching and Rash    Patient Measurements: Height: 6\' 3"  (190.5 cm) Weight: (!) 503 lb 9.6 oz (228.4 kg) IBW/kg (Calculated) : 84.5 Heparin Dosing Weight:    Vital Signs: Temp: 98.6 F (37 C) (03/28 1200) Temp Source: Oral (03/28 1200) BP: 114/93 (03/28 1200) Pulse Rate: 104 (03/28 1000)  Labs:  Recent Labs  11/04/16 0130  11/04/16 0131 11/05/16 0434 11/05/16 0809 11/06/16 0428  HGB 14.5  --   --  14.4  --  14.8  HCT 45.1  --   --  45.4  --  46.0  PLT 245  --   --  235  --  229  LABPROT  --   < > 16.8* 22.6* 24.5* 22.2*  INR  --   < > 1.35 1.96 2.16 1.92  HEPARINUNFRC  --   --  0.36 0.31  --  0.53  CREATININE 1.44*  --   --  1.49*  --  1.49*  < > = values in this interval not displayed.  Estimated Creatinine Clearance: 137.8 mL/min (A) (by C-G formula based on SCr of 1.49 mg/dL (H)).  Medications: Heparin @ 2200 units/hr  Assessment: 35yom continues on heparin to coumadin bridge for new afib. Heparin level is therapeutic at 0.53. Coumadin added 3/24. INR jumped yesterday after 3 doses to 1.95, recheck confirmed accurate at 2.1. Dose held yesterday. INR down to 1.92 today.  CBC stable. No bleeding. Continues on IV amiodarone. Plan for TEE/DCCV once volume status improved.   Goal of Therapy:  INR goal 2-3 Heparin level 0.3-0.7 units/ml Monitor platelets by anticoagulation protocol: Yes   Plan:  1) Continue heparin at 2200 units/hr  2) Coumadin 7.5mg  x 1 3) Daily heparin level, INR, CBC  Louie Casa, PharmD, BCPS 11/06/2016 2:03 PM

## 2016-11-06 NOTE — Progress Notes (Signed)
Pt HR remains afib 140s, increasing to 160s with activity. Pt is asymptomatic and states he does not feel his heart racing and denies lightheadedness, dizziness, etc. Amio infusing at 30mg /hr per orders w/o complication. On call cardiology paged to make aware that pt HR is not controlled. EKG confirms pt still afib, Dr. Liane Comber aware. Per MD, continue to monitor and notify cards if pt becomes symptomatic, otherwise continue to monitor closely.

## 2016-11-06 NOTE — Progress Notes (Signed)
Advanced Heart Failure Rounding Note   Subjective:    PICC placed 11/02/16 and moved to SDU. Milrinone started.   Coox 38% 11/03/16. Milrinone increased.  Coox 68.2% on milrinone 0375 mcg/kg/min. CVP 15-16  Remains dyspneic with exertion and orthopneic.  Weight coming down slowly.  Urine slightly darker today. Denies tachypalpitations.   Echo 11/03/16 LVEF 20-25%. Valves poorly visualized.  Creatinine stable. K 4.0 on increased supp.   Breathing better. Continues to diurese. BP stable. Creatinine stable at 1.5. Remains in AF with RVR despite amio,   Only out 500 cc despite addition of metolazone. Weight shows down 3 more lbs.   Objective:   Weight Range:  Vital Signs:   Temp:  [97.7 F (36.5 C)-98.5 F (36.9 C)] 97.7 F (36.5 C) (03/28 0813) Pulse Rate:  [56-144] 70 (03/28 0813) BP: (116-140)/(81-105) 119/105 (03/28 0813) SpO2:  [86 %-99 %] 99 % (03/28 0813) Weight:  [503 lb 9.6 oz (228.4 kg)] 503 lb 9.6 oz (228.4 kg) (03/28 0500) Last BM Date: 11/05/16  Weight change: Filed Weights   11/04/16 0355 11/05/16 0430 11/06/16 0500  Weight: (!) 509 lb 1.6 oz (230.9 kg) (!) 506 lb 6.4 oz (229.7 kg) (!) 503 lb 9.6 oz (228.4 kg)    Intake/Output:   Intake/Output Summary (Last 24 hours) at 11/06/16 0936 Last data filed at 11/06/16 0700  Gross per 24 hour  Intake          2923.89 ml  Output             4000 ml  Net         -1076.11 ml     Physical Exam: CVP 15-16 Personally reviewed General: Markedly obese. Seated in chair. NAD   HEENT: Normal anicteric Neck: Supple. JVP unable to measure due to size  Carotids 2+ bilat; no bruits. No thyromegaly or nodule noted.   Cor: PMI non-palpable. IRR, Tachy. Distant HS no obvious murmur Lungs: Distant. Decreased throughout. No wheezing  Abdomen: Markedly obese  NT, mild/mod + distention, no HSM. No bruits or masses. +BS  Extremities: No cyanosis, clubbing, or rare. 2 + edema persists. Ted hose in place.  Neuro: Alert &  oriented x 3. Cranial nerves grossly intact. Moves all 4 extremities w/o difficulty. Affect pleasant    Telemetry: Personally reviewed, AF 130-150s   Labs: Basic Metabolic Panel:  Recent Labs Lab 11/01/16 0013 11/02/16 0457 11/03/16 0358 11/04/16 0130 11/05/16 0434 11/06/16 0428  NA 137 135 138 138 132* 136  K 3.7 3.8 3.2* 3.1* 3.0* 4.0  CL 99* 97* 97* 96* 90* 91*  CO2 26 23 28 30 30  32  GLUCOSE 107* 123* 117* 126* 274* 125*  BUN 33* 44* 39* 29* 23* 22*  CREATININE 1.74* 2.07* 1.73* 1.44* 1.49* 1.49*  CALCIUM 8.8* 9.2 8.9 9.0 8.8* 9.4  MG 1.9  --  2.0  --   --   --     Liver Function Tests:  Recent Labs Lab 10/31/16 1005  AST 47*  ALT 55  ALKPHOS 50  BILITOT 3.4*  PROT 6.4*  ALBUMIN 3.8   No results for input(s): LIPASE, AMYLASE in the last 168 hours. No results for input(s): AMMONIA in the last 168 hours.  CBC:  Recent Labs Lab 11/02/16 0457 11/03/16 0358 11/04/16 0130 11/05/16 0434 11/06/16 0428  WBC 13.3* 11.6* 11.1* 11.0* 10.2  HGB 15.1 14.4 14.5 14.4 14.8  HCT 46.8 45.2 45.1 45.4 46.0  MCV 88.6 88.6 88.6 89.5 88.3  PLT  262 241 245 235 229    Cardiac Enzymes: No results for input(s): CKTOTAL, CKMB, CKMBINDEX, TROPONINI in the last 168 hours.  BNP: BNP (last 3 results)  Recent Labs  07/05/16 1549 10/31/16 1005  BNP 327.3* 831.9*    ProBNP (last 3 results) No results for input(s): PROBNP in the last 8760 hours.    Other results:  Imaging: No results found.   Medications:     Scheduled Medications: . atorvastatin  40 mg Oral Daily  . digoxin  0.25 mg Oral Daily  . furosemide  120 mg Intravenous BID  . potassium chloride  40 mEq Oral Once  . potassium chloride  60 mEq Oral TID  . sodium chloride flush  10-40 mL Intracatheter Q12H  . sodium chloride flush  3 mL Intravenous Q12H  . spironolactone  25 mg Oral BID  . Warfarin - Pharmacist Dosing Inpatient   Does not apply q1800    Infusions: . sodium chloride 10 mL/hr at  11/06/16 0700  . amiodarone 30 mg/hr (11/06/16 0700)  . heparin 2,200 Units/hr (11/06/16 0700)  . milrinone 0.375 mcg/kg/min (11/06/16 0700)    PRN Medications: sodium chloride, acetaminophen, sodium chloride flush, sodium chloride flush   Assessment/Plan:   1. 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 Valves poorly visualized. - Coox 68.4% on milrinone 0.375 mcg/kg/min.  - Continue IV lasix 120 mg BID and repeat metolazone 5 mg this am.   - Continue K supp.  - Continue spiro 25 mg BID.  - No ACE/ARB with recent AKI. Holding BB with low output.   - Has previously had cough on Entresto. Refuses to re-challenge.   2. A fib RVR - Remains in RVR with rates in 130-140s this am.   - Have discussed with EP, not candidate for ablation - TEE-DCCV may be very difficult with pt size and chronic, significant orthopnea.  - Intolerant to Lopressor.  - Rates remain poorly controlled despite amio. Started on digoxin 0.25 mg daily yesterday. Will follow levels closely. Rate slightly lower but remains elevated.  - Continue heparin. Will need coumadin. No data for NOACs in his weight category. 3. Acute/CKD Stage III - Continue to follow closely with diuresis.  4. OSA - Needs nightly CPAP. Not covered by insurance. Case management working on helping with outpatient sleep study.  5. Super morbid obesity - Needs to lose a significant amount of weight.  6. Noncompliance - Will arrange paramedicine on discharge. No change.  7. Hypokalemia - Improved with increase K supp. Continue.  - Continue spiro 25 mg BID     Continue to diurese.  Will eventually need to attempt definitive rhythm control, though poor options with patients marked obesity.   Length of Stay: [address]  [place]  11/06/2016, 9:36 AM  Advanced Heart Failure Team Pager [phone] (M-F; 7a - 4p)  Please contact [facility] for night-coverage after hours (4p -7a ) and weekends on [email or web address]  Patient seen and examined with Otilio Saber, PA-C. We discussed all aspects of the encounter. I agree with the assessment and plan as stated above.   Volume status and respiratory status improving. Renal function stable on milrinone support. Will continue IV diuretics. Supp electrolytes as needed.  Remains in AF. Rate fast despite amio. Will plan TEE and DCVV in OR tomorrow. We discussed options of AVN ablation and CRT if fails.Though his size may pose a  challenge. Needs RHC prior to d/c.   TEE and DC-CV coordinated with OR.   Nurse navigator seeing to arrange Paramedicine. We reinforced the need for this.  [name], MD  3:36 PM

## 2016-11-07 ENCOUNTER — Inpatient Hospital Stay (HOSPITAL_COMMUNITY): Payer: Managed Care, Other (non HMO) | Admitting: Certified Registered Nurse Anesthetist

## 2016-11-07 ENCOUNTER — Inpatient Hospital Stay (HOSPITAL_COMMUNITY): Payer: Managed Care, Other (non HMO)

## 2016-11-07 ENCOUNTER — Encounter (HOSPITAL_COMMUNITY)
Admission: EM | Disposition: A | Discharge status: Home / Self Care | Payer: Self-pay | Source: Home / Self Care | Attending: Internal Medicine

## 2016-11-07 DIAGNOSIS — I351 Nonrheumatic aortic (valve) insufficiency: Secondary | ICD-10-CM

## 2016-11-07 HISTORY — PX: CARDIOVERSION: SHX1299

## 2016-11-07 HISTORY — PX: TEE WITHOUT CARDIOVERSION: SHX5443

## 2016-11-07 LAB — HEPARIN LEVEL (UNFRACTIONATED): HEPARIN UNFRACTIONATED: 0.41 [IU]/mL (ref 0.30–0.70)

## 2016-11-07 LAB — BASIC METABOLIC PANEL
Anion gap: 11 (ref 5–15)
BUN: 24 mg/dL — AB (ref 6–20)
CHLORIDE: 92 mmol/L — AB (ref 101–111)
CO2: 30 mmol/L (ref 22–32)
Calcium: 8.8 mg/dL — ABNORMAL LOW (ref 8.9–10.3)
Creatinine, Ser: 1.58 mg/dL — ABNORMAL HIGH (ref 0.61–1.24)
GFR calc Af Amer: >60 mL/min (ref >60)
GFR calc non Af Amer: 55 mL/min — ABNORMAL LOW (ref >60)
GLUCOSE: 116 mg/dL — AB (ref 65–99)
POTASSIUM: 4.4 mmol/L (ref 3.5–5.1)
Sodium: 133 mmol/L — ABNORMAL LOW (ref 135–145)

## 2016-11-07 LAB — PROTIME-INR
INR: 2.05
Prothrombin Time: 23.4 seconds — ABNORMAL HIGH (ref 11.4–15.2)

## 2016-11-07 LAB — COOXEMETRY PANEL
Carboxyhemoglobin: 1.7 % — ABNORMAL HIGH (ref 0.5–1.5)
Methemoglobin: 0.5 % (ref 0.0–1.5)
O2 Saturation: 64.7 %
TOTAL HEMOGLOBIN: 15.2 g/dL (ref 12.0–16.0)

## 2016-11-07 LAB — CBC
HEMATOCRIT: 44.9 % (ref 39.0–52.0)
HEMOGLOBIN: 14.5 g/dL (ref 13.0–17.0)
MCH: 28.4 pg (ref 26.0–34.0)
MCHC: 32.3 g/dL (ref 30.0–36.0)
MCV: 87.9 fL (ref 78.0–100.0)
Platelets: 250 10*3/uL (ref 150–400)
RBC: 5.11 MIL/uL (ref 4.22–5.81)
RDW: 13.7 % (ref 11.5–15.5)
WBC: 11.3 10*3/uL — ABNORMAL HIGH (ref 4.0–10.5)

## 2016-11-07 SURGERY — CARDIOVERSION
Anesthesia: General

## 2016-11-07 MED ORDER — PROPOFOL 10 MG/ML IV BOLUS
INTRAVENOUS | Status: DC | PRN
  Administered 2016-11-07: 200 mg via INTRAVENOUS

## 2016-11-07 MED ORDER — LIDOCAINE 2% (20 MG/ML) 5 ML SYRINGE
INTRAMUSCULAR | Status: DC | PRN
  Administered 2016-11-07: 80 mg via INTRAVENOUS

## 2016-11-07 MED ORDER — WARFARIN SODIUM 7.5 MG PO TABS
7.5000 mg | ORAL_TABLET | Freq: Once | ORAL | Status: AC
Start: 2016-11-07 — End: 2016-11-07
  Administered 2016-11-07: 7.5 mg via ORAL
  Filled 2016-11-07: qty 1

## 2016-11-07 MED ORDER — ONDANSETRON HCL 4 MG/2ML IJ SOLN
INTRAMUSCULAR | Status: DC | PRN
  Administered 2016-11-07: 4 mg via INTRAVENOUS

## 2016-11-07 MED ORDER — PHENYLEPHRINE 40 MCG/ML (10ML) SYRINGE FOR IV PUSH (FOR BLOOD PRESSURE SUPPORT)
PREFILLED_SYRINGE | INTRAVENOUS | Status: DC | PRN
  Administered 2016-11-07 (×2): 80 ug via INTRAVENOUS

## 2016-11-07 MED ORDER — LIDOCAINE 2% (20 MG/ML) 5 ML SYRINGE
INTRAMUSCULAR | Status: AC
  Filled 2016-11-07: qty 5

## 2016-11-07 MED ORDER — PHENYLEPHRINE HCL 10 MG/ML IJ SOLN
INTRAMUSCULAR | Status: DC | PRN
  Administered 2016-11-07: 80 ug via INTRAVENOUS

## 2016-11-07 MED ORDER — HEPARIN (PORCINE) IN NACL 100-0.45 UNIT/ML-% IJ SOLN
INTRAMUSCULAR | Status: AC
  Filled 2016-11-07: qty 250

## 2016-11-07 MED ORDER — SUCCINYLCHOLINE CHLORIDE 200 MG/10ML IV SOSY
PREFILLED_SYRINGE | INTRAVENOUS | Status: AC
  Filled 2016-11-07: qty 10

## 2016-11-07 MED ORDER — SUGAMMADEX SODIUM 500 MG/5ML IV SOLN
INTRAVENOUS | Status: DC | PRN
  Administered 2016-11-07: 500 mg via INTRAVENOUS

## 2016-11-07 MED ORDER — SUCCINYLCHOLINE CHLORIDE 20 MG/ML IJ SOLN
INTRAMUSCULAR | Status: DC | PRN
  Administered 2016-11-07: 100 mg via INTRAVENOUS

## 2016-11-07 MED ORDER — LACTATED RINGERS IV SOLN
INTRAVENOUS | Status: DC | PRN
Start: 2016-11-07 — End: 2016-11-07
  Administered 2016-11-07: 13:00:00 via INTRAVENOUS

## 2016-11-07 MED ORDER — ROCURONIUM BROMIDE 50 MG/5ML IV SOSY
PREFILLED_SYRINGE | INTRAVENOUS | Status: AC
  Filled 2016-11-07: qty 5

## 2016-11-07 MED ORDER — ROCURONIUM BROMIDE 10 MG/ML (PF) SYRINGE
PREFILLED_SYRINGE | INTRAVENOUS | Status: DC | PRN
Start: 2016-11-07 — End: 2016-11-07
  Administered 2016-11-07: 50 mg via INTRAVENOUS

## 2016-11-07 MED ORDER — SUGAMMADEX SODIUM 500 MG/5ML IV SOLN
INTRAVENOUS | Status: AC
  Filled 2016-11-07: qty 5

## 2016-11-07 MED ORDER — POTASSIUM CHLORIDE CRYS ER 20 MEQ PO TBCR
40.0000 meq | EXTENDED_RELEASE_TABLET | Freq: Three times a day (TID) | ORAL | Status: DC
  Administered 2016-11-07 – 2016-11-09 (×6): 40 meq via ORAL
  Filled 2016-11-07 (×7): qty 2

## 2016-11-07 NOTE — Transfer of Care (Signed)
Immediate Anesthesia Transfer of Care Note  Patient: Mark Baker  Procedure(s) Performed: Procedure(s) with comments: CARDIOVERSION (N/A) - Will need extra help positioning patient TRANSESOPHAGEAL ECHOCARDIOGRAM (TEE) (N/A)  Patient Location: PACU  Anesthesia Type:General  Level of Consciousness: awake, alert , oriented and patient cooperative  Airway & Oxygen Therapy: Patient Spontanous Breathing and Patient connected to face mask oxygen  Post-op Assessment: Report given to RN, Post -op Vital signs reviewed and stable and Patient moving all extremities X 4  Post vital signs: Reviewed and stable  Last Vitals:  Vitals:   11/07/16 0751 11/07/16 1141  BP: (!) 121/100 (!) 117/93  Pulse: 79 (!) 110  Resp:    Temp: 36.4 C 37 C    Last Pain:  Vitals:   11/07/16 1141  TempSrc: Oral  PainSc:          Complications: No apparent anesthesia complications

## 2016-11-07 NOTE — Anesthesia Preprocedure Evaluation (Signed)
Anesthesia Evaluation  Patient identified by MRN, date of birth, ID band Patient awake    Reviewed: Allergy & Precautions, NPO status , Patient's Chart, lab work & pertinent test results  History of Anesthesia Complications Negative for: history of anesthetic complications  Airway Mallampati: III  TM Distance: >3 FB Neck ROM: Full    Dental  (+) Teeth Intact   Pulmonary asthma , sleep apnea ,    breath sounds clear to auscultation       Cardiovascular hypertension, Pt. on medications +CHF  + dysrhythmias  Rhythm:Irregular     Neuro/Psych negative neurological ROS  negative psych ROS   GI/Hepatic negative GI ROS, Neg liver ROS,   Endo/Other  Morbid obesity  Renal/GU Renal InsufficiencyRenal disease     Musculoskeletal   Abdominal   Peds  Hematology negative hematology ROS (+)   Anesthesia Other Findings   Reproductive/Obstetrics                             Anesthesia Physical Anesthesia Plan  ASA: III  Anesthesia Plan: General   Post-op Pain Management:    Induction: Intravenous and Rapid sequence  Airway Management Planned: Oral ETT  Additional Equipment: None  Intra-op Plan:   Post-operative Plan: Extubation in OR  Informed Consent: I have reviewed the patients History and Physical, chart, labs and discussed the procedure including the risks, benefits and alternatives for the proposed anesthesia with the patient or authorized representative who has indicated his/her understanding and acceptance.   Dental advisory given  Plan Discussed with: CRNA and Surgeon  Anesthesia Plan Comments:         Anesthesia Quick Evaluation

## 2016-11-07 NOTE — Progress Notes (Signed)
Advanced Heart Failure Rounding Note   Subjective:    PICC placed 11/02/16 and moved to SDU. Milrinone started.   Coox 38% 11/03/16. Milrinone increased.  Coox 64.7% on milrinone 0.375 mcg/kg/min. CVP 13-14. Remains in Afib RVR despite amiodarone gtt.  Remains orthopneic. Dyspneic with exertion.  No tachypalpitations. Denies CP.   Weight continues to drop. Breathing getting better. Still in AF with RVR despite amio.   Echo 11/03/16 LVEF 20-25%. Valves poorly visualized.  Creatinine relatively stable. K 4.4.  Out 3.1 L and down 5 more lbs.    Objective:   Weight Range:  Vital Signs:   Temp:  [97.5 F (36.4 C)-98.8 F (37.1 C)] 97.5 F (36.4 C) (03/29 0751) Pulse Rate:  [77-151] 79 (03/29 0751) BP: (111-130)/(69-100) 121/100 (03/29 0751) SpO2:  [91 %-100 %] 100 % (03/29 0751) Weight:  [498 lb 6.4 oz (226.1 kg)] 498 lb 6.4 oz (226.1 kg) (03/29 0340) Last BM Date: 11/06/16  Weight change: Filed Weights   11/05/16 0430 11/06/16 0500 11/07/16 0340  Weight: (!) 506 lb 6.4 oz (229.7 kg) (!) 503 lb 9.6 oz (228.4 kg) (!) 498 lb 6.4 oz (226.1 kg)    Intake/Output:   Intake/Output Summary (Last 24 hours) at 11/07/16 0913 Last data filed at 11/07/16 0800  Gross per 24 hour  Intake           2206.7 ml  Output             5550 ml  Net          -3343.3 ml     Physical Exam: CVP 13  Personally reviewed General: Markedly obese. Sitting in chair. NAD HEENT: Normal  anicteric Neck: Supple. JVP unable to evaluate due to girth  Carotids 2+ bilat; no bruits. No thyromegaly or nodule noted.   Cor: PMI non-palpable. IRR. IRR tachy Distant HS no obvious murmur Lungs: Distant and decreased throughout.   No wheeze Abdomen: Markedly obese  NT, mild/mod distention, no HSM. No bruits or masses. +BS  Extremities: No cyanosis, clubbing, or rare. 1-2+ edema Ted hose in place.  Neuro: Alert & oriented x 3. Cranial nerves grossly intact. Moves all 4 extremities w/o difficulty. Affect  pleasant    Telemetry: AF 130-140 Personally reviewed   Labs: Basic Metabolic Panel:  Recent Labs Lab 11/01/16 0013  11/03/16 0358 11/04/16 0130 11/05/16 0434 11/06/16 0428 11/07/16 0511  NA 137  < > 138 138 132* 136 133*  K 3.7  < > 3.2* 3.1* 3.0* 4.0 4.4  CL 99*  < > 97* 96* 90* 91* 92*  CO2 26  < > 28 30 30  32 30  GLUCOSE 107*  < > 117* 126* 274* 125* 116*  BUN 33*  < > 39* 29* 23* 22* 24*  CREATININE 1.74*  < > 1.73* 1.44* 1.49* 1.49* 1.58*  CALCIUM 8.8*  < > 8.9 9.0 8.8* 9.4 8.8*  MG 1.9  --  2.0  --   --   --   --   < > = values in this interval not displayed.  Liver Function Tests:  Recent Labs Lab 10/31/16 1005  AST 47*  ALT 55  ALKPHOS 50  BILITOT 3.4*  PROT 6.4*  ALBUMIN 3.8   No results for input(s): LIPASE, AMYLASE in the last 168 hours. No results for input(s): AMMONIA in the last 168 hours.  CBC:  Recent Labs Lab 11/03/16 0358 11/04/16 0130 11/05/16 0434 11/06/16 0428 11/07/16 0511  WBC 11.6* 11.1* 11.0*  10.2 11.3*  HGB 14.4 14.5 14.4 14.8 14.5  HCT 45.2 45.1 45.4 46.0 44.9  MCV 88.6 88.6 89.5 88.3 87.9  PLT 241 245 235 229 250    Cardiac Enzymes: No results for input(s): CKTOTAL, CKMB, CKMBINDEX, TROPONINI in the last 168 hours.  BNP: BNP (last 3 results)  Recent Labs  07/05/16 1549 10/31/16 1005  BNP 327.3* 831.9*    ProBNP (last 3 results) No results for input(s): PROBNP in the last 8760 hours.    Other results:  Imaging: No results found.   Medications:     Scheduled Medications: . atorvastatin  40 mg Oral Daily  . digoxin  0.25 mg Oral Daily  . furosemide  120 mg Intravenous BID  . potassium chloride  40 mEq Oral Once  . potassium chloride  60 mEq Oral TID  . sodium chloride flush  10-40 mL Intracatheter Q12H  . sodium chloride flush  3 mL Intravenous Q12H  . sodium chloride flush  3 mL Intravenous Q12H  . spironolactone  25 mg Oral BID  . Warfarin - Pharmacist Dosing Inpatient   Does not apply q1800     Infusions: . sodium chloride 10 mL/hr at 11/07/16 0800  . sodium chloride    . amiodarone 30 mg/hr (11/07/16 0800)  . heparin 2,200 Units/hr (11/07/16 0800)  . milrinone 0.375 mcg/kg/min (11/07/16 0800)    PRN Medications: sodium chloride, acetaminophen, sodium chloride flush, sodium chloride flush, sodium chloride flush   Assessment/Plan:   1. 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 Valves poorly visualized. - Coox 64.7% on milrinone 0.375 mcg/kg/min. Hope to wean once cardioverted. - Continue IV lasix 120 mg BID for now.     - Back K supp down to 40 meq TID.   - Continue spiro 25 mg BID.  - No ACE/ARB with recent AKI. Holding BB with low output.   - Has previously had cough on Entresto. Refuses to re-challenge.   2. A fib RVR - Plan for TEE-DCCV in OR this afternoon. Discussed risks and benefits with patient and he agrees to proceed. - Have discussed with EP, not candidate for ablation - Intolerant to Lopressor.  - Rates remain poorly controlled despite amio.  - Continue digoxin 0.25 mg daily   - Continue heparin. Got coumadin last night. Dosing per pharm.  3. Acute/CKD Stage III - Continue to follow closely.   4. OSA - Needs nightly CPAP. Not covered by insurance. Case management working on helping with outpatient sleep study. No change to current plan.  5. Super morbid obesity - Needs to lose weight.  6. Noncompliance - Will arrange paramedicine on discharge. No change.  7. Hypokalemia - Back K supp to 40 meq TID.  - Continue spiro 25 mg BID     Plan for TEE/DCCV in OR this afternoon.   Length of Stay: [address]  Graciella Freer, [place]  11/07/2016, 9:13 AM  Advanced Heart Failure Team Pager [phone] (M-F; 7a - 4p)  Please contact [facility] for night-coverage after hours (4p -7a ) and weekends on [email or web address]  Patient seen and examined with Otilio Saber,  PA-C. We discussed all aspects of the encounter. I agree with the assessment and plan as stated above.   He remains tenuous with rapid AF despite amio. Co-ox improved on milrinone. Still volume overloaded. Renal function stable. Electrolytes ok. Continue IV diuresis.   Will plan TEE and DC-CV  today in OR. Continue heparin. Will likely need RHC prior to d/c.   [name], MD  12:50 PM

## 2016-11-07 NOTE — CV Procedure (Signed)
    TRANSESOPHAGEAL ECHOCARDIOGRAM   NAME:  Mark Baker   MRN: [id number] DOB:  03/26/1981   ADMIT DATE: 10/31/2016  INDICATIONS:  Atrial finrillation  PROCEDURE:   Given patient's size. The decision was made to perform TEE and DCCV in OR under GA with anesthesia support.   Informed consent was obtained prior to the procedure. The risks, benefits and alternatives for the procedure were discussed and the patient comprehended these risks.  Risks include, but are not limited to, cough, sore throat, vomiting, nausea, somnolence, esophageal and stomach trauma or perforation, bleeding, low blood pressure, aspiration, pneumonia, infection, trauma to the teeth and death.    After a procedural time-out, the patient was sedated and intubated by the anesthesia service.  The transesophageal probe was inserted in the esophagus and stomach without difficulty and multiple views were obtained. During the TEE, the patient converted to sinus rhythm so DC-CV was not performed.   COMPLICATIONS:    There were no immediate complications.  FINDINGS:  LEFT VENTRICLE: Dilated. EF = 20%. Severe global HK.  RIGHT VENTRICLE: Dilated. Moderate HK,    LEFT ATRIUM: Massively dilated 7.7 cm  LEFT ATRIAL APPENDAGE: No thrombus.   RIGHT ATRIUM: Severely dilated  AORTIC VALVE:  Trileaflet. Normal. No AI/AS  MITRAL VALVE:    Normal. Dilated annulus. Severe central MR.  TRICUSPID VALVE: Normal. Moderate TR. RVSP 65-70  PULMONIC VALVE: Grossly normal. Trivial PR  INTERATRIAL SEPTUM: No PFO or ASD.  PERICARDIUM: No effusion   DESCENDING AORTA: No plaque  Aurea Aronov,MD 11:00 PM

## 2016-11-07 NOTE — Progress Notes (Signed)
Heart Failure Navigator Consult Note  Presentation: Mark Baker is 36 year old with a history of combined systolic/diastolic heart failure, NICM, OSA, obesity, and noncompliance admitted with new onset A fib RVR.   Admitted 11/24 - 07/08/16 with volume overload in setting medication noncompliance.   Admitted with A Fib RVR and volume overload. Started on lopressor, heparin, and IV lasix + metolazone. Lopressor later stopped due abdominal discomfort. Of note he was on Toprol XL at home. CXR with vascular congestion noted. Sluggish urine output noted. Pertinent admission labs include: BNP 831, Hgb 15.5, Sodium 135, Creatinine 2.02    Cardiac Studies ECHO 07/07/2016: LVEF 20-25%. Moderately dilated RV. Grade II DDLHC 2014 normal coronaries  Past Medical History:  Diagnosis Date  . Asthma    childhood asthma no exacerbation since age 34   . Chronic combined systolic and diastolic CHF (congestive heart failure) (HCC)    a) ECHO (11/2012): EF 40-45%, RV fx difficult to see, nl size b) ECHO (05/2014): EF 20-25%, grade 2 DD, RV mildly dilated and sys fx mod reduced  . Hypertension   . NICM (nonischemic cardiomyopathy) (HCC)    a) (11/2012): normal coronaries  . OSA (obstructive sleep apnea)     Social History   Social History  . Marital status: Married    Spouse name: N/A  . Number of children: N/A  . Years of education: N/A   Social History Main Topics  . Smoking status: Never Smoker  . Smokeless tobacco: Never Used  . Alcohol use 0.0 oz/week     Comment: seldom.  . Drug use: No  . Sexual activity: Not Asked   Other Topics Concern  . None   Social History Narrative   Lives in [place] with wife of 4 years.  Works for [employer] at [employer].  1 son and 2 daughters.     Denies cigarettes. Drank 1 beer last night 11/22/12. Denies drugs           ECHO:Study Conclusions--11/03/16  - Left ventricle: The cavity size was severely dilated. Wall   thickness was normal. Systolic function  was severely reduced. The   estimated ejection fraction was in the range of 20% to 25%. The   study is not technically sufficient to allow evaluation of LV   diastolic function. - Technically very limited study, poor visualization. Echcontrast   was used to Conservation officer, nature. Limited diagnostic ability   even with contrast.  ------------------------------------------------------------------- Study data:  Comparison was made to the study of 07/07/2016.  Study status:  Routine.  Procedure:  The patient reported no pain pre or post test. Transthoracic echocardiography. Image quality was poor. The study was technically difficult, as a result of poor acoustic windows, poor sound wave transmission, and body habitus. Intravenous contrast (Definity) was administered.  Study completion:  There were no complications.          Transthoracic echocardiography.  M-mode, complete 2D, spectral Doppler, and color Doppler.  Birthdate:  Patient birthdate: 01-23-1981.  Age:  Patient is 36 yr old.  Sex:  Gender: male.    BMI: 64.9 kg/m^2.  Blood pressure:     141/97  Patient status:  Inpatient.  Study date: Study date: 11/03/2016. Study time: 03:08 PM.  Location:  ICU/CCU  BNP    Component Value Date/Time   BNP 831.9 (H) 10/31/2016 1005    ProBNP    Component Value Date/Time   PROBNP 1,544.0 (H) 05/21/2014 1940     Education Assessment and Provision:  Detailed  education and instructions provided on heart failure disease management including the following:  Signs and symptoms of Heart Failure When to call the physician Importance of daily weights Low sodium diet Fluid restriction Medication management Anticipated future follow-up appointments  Patient education given on each of the above topics.  Patient acknowledges understanding and acceptance of all instructions.   I spoke with Mark Baker regarding his current hospitalization and HF diagnosis.  I have spoken to him before during  previous hospitalization.  He was referred to [facility] at that time and has been for one appt and No Showed or Cancelled the rest.  He says he was not aware that he had those appts.  I reviewed the importance of daily weights -- he does not have a scale that will accommodate his weight--currently 497 pounds.  I will order a scale that will work for him.  I reviewed a low sodium diet and high sodium foods to avoid.  He tells me that he does "not eat those foods".  With some vague dietary recall I am sure that he is eating high sodium foods.  He says that he is now more open to "doing what we say" and that he wants to feel better.  I encouraged him to Not stop medications unless instructed.  He tells me that he had issues getting refills from our clinic sent to his pharmacy.  He also tells me that "Aundra Millet" in the HF Clinic needs to send in his FMLA / Short Term Disability paperwork.  He does plan to follow-up in the AHF Clinic going forward and I have encouraged him to call me with concerns or questions regarding his HF.  He will also benefit from HF [employer] referral and he is agreeable.  Education Materials:  "Living Better With Heart Failure" Booklet, Daily Weight Tracker Tool    High Risk Criteria for Readmission and/or Poor Patient Outcomes:   EF <30%- Yes 20-25%  2 or more admissions in 6 months- 2/24mo  Difficult social situation- no  Demonstrates medication noncompliance- yes--difficulty with prescriptions, gaps in med history    Barriers of Care:  Knowledge and compliance  Discharge Planning:   Plans to return to home

## 2016-11-07 NOTE — Anesthesia Procedure Notes (Signed)
Procedure Name: Intubation Date/Time: 11/07/2016 1:32 PM Performed by: Annabelle Harman A Pre-anesthesia Checklist: Patient identified, Emergency Drugs available, Suction available and Patient being monitored Patient Re-evaluated:Patient Re-evaluated prior to inductionOxygen Delivery Method: Circle system utilized Preoxygenation: Pre-oxygenation with 100% oxygen Intubation Type: IV induction and Rapid sequence Laryngoscope Size: Glidescope and 4 Grade View: Grade I Tube type: Oral Tube size: 7.5 mm Number of attempts: 1 Airway Equipment and Method: Rigid stylet Placement Confirmation: ETT inserted through vocal cords under direct vision,  positive ETCO2 and breath sounds checked- equal and bilateral Secured at: 24 cm Tube secured with: Tape Dental Injury: Teeth and Oropharynx as per pre-operative assessment  Difficulty Due To: Difficulty was anticipated, Difficult Airway- due to large tongue and Difficult Airway- due to limited oral opening

## 2016-11-07 NOTE — Progress Notes (Signed)
ANTICOAGULATION CONSULT NOTE - Follow Up Consult  Pharmacy Consult for Heparin and Coumadin Indication: atrial fibrillation  Allergies  Allergen Reactions  . Aspirin Hives, Itching and Rash    Patient Measurements: Height: 6\' 3"  (190.5 cm) Weight: (!) 498 lb 6.4 oz (226.1 kg) IBW/kg (Calculated) : 84.5 Heparin Dosing Weight:    Vital Signs: Temp: 97.5 F (36.4 C) (03/29 0751) Temp Source: Oral (03/29 0751) BP: 121/100 (03/29 0751) Pulse Rate: 79 (03/29 0751)  Labs:  Recent Labs  11/05/16 0434 11/05/16 0809 11/06/16 0428 11/07/16 0511  HGB 14.4  --  14.8 14.5  HCT 45.4  --  46.0 44.9  PLT 235  --  229 250  LABPROT 22.6* 24.5* 22.2* 23.4*  INR 1.96 2.16 1.92 2.05  HEPARINUNFRC 0.31  --  0.53 0.41  CREATININE 1.49*  --  1.49* 1.58*    Estimated Creatinine Clearance: 129 mL/min (A) (by C-G formula based on SCr of 1.58 mg/dL (H)).  Medications: Heparin @ 2200 units/hr  Assessment: 35yom continues on heparin to coumadin bridge for new afib. Heparin level is therapeutic at 0.41. Coumadin added 3/24. INR jumped 3/27 after 3 doses to 1.95, recheck confirmed accurate at 2.1. Dose held 3/27. INR down to 1.92 yesterday and coumadin resumed. INR therapeutic today at 2.05. CBC stable. No bleeding. Continues on IV amiodarone. Plan for TEE/DCCV today.   Goal of Therapy:  INR goal 2-3 Heparin level 0.3-0.7 units/ml Monitor platelets by anticoagulation protocol: Yes   Plan:  1) Continue heparin at 2200 units/hr at least through today since INR barely at goal and he's getting TEE/DCCV 2) Coumadin 7.5mg  x 1 3) Daily heparin level, INR, CBC  Louie Casa, PharmD, BCPS 11/07/2016 9:39 AM

## 2016-11-07 NOTE — Progress Notes (Signed)
Pt tongue swollen post TEE. Trauma noted on posterior side of tongue. MD notified. Heparin on hold till 2300. Will continue to monitor.

## 2016-11-08 ENCOUNTER — Encounter (HOSPITAL_COMMUNITY): Payer: Self-pay | Admitting: Internal Medicine

## 2016-11-08 LAB — BASIC METABOLIC PANEL
Anion gap: 13 (ref 5–15)
BUN: 26 mg/dL — ABNORMAL HIGH (ref 6–20)
CALCIUM: 9 mg/dL (ref 8.9–10.3)
CHLORIDE: 89 mmol/L — AB (ref 101–111)
CO2: 31 mmol/L (ref 22–32)
CREATININE: 1.65 mg/dL — AB (ref 0.61–1.24)
GFR calc Af Amer: >60 mL/min (ref >60)
GFR calc non Af Amer: 52 mL/min — ABNORMAL LOW (ref >60)
GLUCOSE: 137 mg/dL — AB (ref 65–99)
Potassium: 4.4 mmol/L (ref 3.5–5.1)
Sodium: 133 mmol/L — ABNORMAL LOW (ref 135–145)

## 2016-11-08 LAB — CBC
HCT: 45.4 % (ref 39.0–52.0)
HEMOGLOBIN: 14.9 g/dL (ref 13.0–17.0)
MCH: 28.9 pg (ref 26.0–34.0)
MCHC: 32.8 g/dL (ref 30.0–36.0)
MCV: 88 fL (ref 78.0–100.0)
PLATELETS: 228 10*3/uL (ref 150–400)
RBC: 5.16 MIL/uL (ref 4.22–5.81)
RDW: 13.5 % (ref 11.5–15.5)
WBC: 10.8 10*3/uL — ABNORMAL HIGH (ref 4.0–10.5)

## 2016-11-08 LAB — COOXEMETRY PANEL
CARBOXYHEMOGLOBIN: 1.8 % — AB (ref 0.5–1.5)
Methemoglobin: 0.6 % (ref 0.0–1.5)
O2 SAT: 51.2 %
Total hemoglobin: 15.2 g/dL (ref 12.0–16.0)

## 2016-11-08 LAB — PROTIME-INR
INR: 2.3
Prothrombin Time: 25.7 seconds — ABNORMAL HIGH (ref 11.4–15.2)

## 2016-11-08 LAB — HEPARIN LEVEL (UNFRACTIONATED): Heparin Unfractionated: 0.31 IU/mL (ref 0.30–0.70)

## 2016-11-08 MED ORDER — TRAMADOL HCL 50 MG PO TABS
100.0000 mg | ORAL_TABLET | Freq: Three times a day (TID) | ORAL | Status: DC | PRN
  Administered 2016-11-08: 100 mg via ORAL
  Filled 2016-11-08: qty 2

## 2016-11-08 MED ORDER — WARFARIN SODIUM 7.5 MG PO TABS
7.5000 mg | ORAL_TABLET | Freq: Once | ORAL | Status: AC
  Administered 2016-11-08: 7.5 mg via ORAL
  Filled 2016-11-08: qty 1

## 2016-11-08 MED FILL — Medication: Qty: 1 | Status: AC

## 2016-11-08 NOTE — Progress Notes (Addendum)
ANTICOAGULATION CONSULT NOTE - Follow Up Consult  Pharmacy Consult for Coumadin Indication: atrial fibrillation  Allergies  Allergen Reactions  . Aspirin Hives, Itching and Rash    Patient Measurements: Height: 6\' 3"  (190.5 cm) Weight: (!) 494 lb 8 oz (224.3 kg) IBW/kg (Calculated) : 84.5  Vital Signs: Temp: 99 F (37.2 C) (03/30 0856) Temp Source: Oral (03/30 0856) BP: 125/60 (03/30 0856)  Labs:  Recent Labs  11/06/16 0428 11/07/16 0511 11/08/16 0404  HGB 14.8 14.5 14.9  HCT 46.0 44.9 45.4  PLT 229 250 228  LABPROT 22.2* 23.4* 25.7*  INR 1.92 2.05 2.30  HEPARINUNFRC 0.53 0.41 0.31  CREATININE 1.49* 1.58* 1.65*    Estimated Creatinine Clearance: 122.9 mL/min (A) (by C-G formula based on SCr of 1.65 mg/dL (H)).  Assessment: 35yom continues on coumadin for new afib. Was scheduled for DCCV yesterday but converted to NSR so procedure never done. INR is therapeutic today at 2.3 so can stop heparin. He continues on IV amiodarone. CBC stable. No bleeding.  Goal of Therapy:  INR goal 2-3 Monitor platelets by anticoagulation protocol: Yes   Plan:  1) Stop heparin 2) Coumadin 7.5mg  x 1 3) Daily INR  Louie Casa, PharmD, BCPS 11/08/2016 10:49 AM

## 2016-11-08 NOTE — Progress Notes (Signed)
Advanced Heart Failure Rounding Note   Subjective:    PICC placed 11/02/16 and moved to SDU. Milrinone started.   Coox 38% 11/03/16. Milrinone increased.  Coox 51.2% on milrinone 0.375 mcg/kg/min. CVP 12-13  Underwent TEE prior to planned DCCV 3/29.  Converted to SR while undergoing TEE. DCCV not performed. Maintaining NSR now.   Remains dyspneic with exertion but feeling slightly better. Orthopneic at baseline. No CP.   TEE 11/07/16 Massive LA (7.7cm), Severely dilated RA, Normal AV, Severe central MR, Moderate TR (RVSP 65-70), Trivial PR.   Echo 11/03/16 LVEF 20-25%. Valves poorly visualized.  Creatinine 1.4 -> 1.5 -> 1.6. K 4.4.  Out 200 cc but weight shows down another 4 lbs. 27 pounds total   Objective:   Weight Range:  Vital Signs:   Temp:  [97.5 F (36.4 C)-99.2 F (37.3 C)] 99 F (37.2 C) (03/30 0856) Pulse Rate:  [96-110] 98 (03/29 1630) Resp:  [11-20] 13 (03/30 0856) BP: (107-144)/(60-105) 125/60 (03/30 0856) SpO2:  [90 %-100 %] 96 % (03/29 1630) Weight:  [494 lb 8 oz (224.3 kg)] 494 lb 8 oz (224.3 kg) (03/30 0300) Last BM Date: 11/06/16  Weight change: Filed Weights   11/06/16 0500 11/07/16 0340 11/08/16 0300  Weight: (!) 503 lb 9.6 oz (228.4 kg) (!) 498 lb 6.4 oz (226.1 kg) (!) 494 lb 8 oz (224.3 kg)    Intake/Output:   Intake/Output Summary (Last 24 hours) at 11/08/16 1118 Last data filed at 11/08/16 0500  Gross per 24 hour  Intake          2645.68 ml  Output             2350 ml  Net           295.68 ml     Physical Exam: CVP 15  Reviewed personally.  General: Markedly obese. Sitting in chair. NAD. HEENT: Normal Neck: Supple. JVP unable to measure due to body habitus. Carotids 2+ bilat; no bruits. No thyromegaly or nodule noted.    Cor: PMI non-palpable. Regular. Slightly tachy. Distant HS. No obvious murmur.  Lungs: Distant and diminished. No wheezing  Abdomen: Markedly obese  NT, mild distention, no HSM. No bruits or masses. +BS   Extremities: No cyanosis, clubbing, or rash. 1-2+ chronic edema. Ted hose in place.   Neuro: Alert & oriented x 3. Cranial nerves grossly intact. Moves all 4 extremities w/o difficulty. Affect pleasant    Telemetry: Personally reviewed, Sinus 90-100s   Labs: Basic Metabolic Panel:  Recent Labs Lab 11/03/16 0358 11/04/16 0130 11/05/16 0434 11/06/16 0428 11/07/16 0511 11/08/16 0404  NA 138 138 132* 136 133* 133*  K 3.2* 3.1* 3.0* 4.0 4.4 4.4  CL 97* 96* 90* 91* 92* 89*  CO2 28 30 30  32 30 31  GLUCOSE 117* 126* 274* 125* 116* 137*  BUN 39* 29* 23* 22* 24* 26*  CREATININE 1.73* 1.44* 1.49* 1.49* 1.58* 1.65*  CALCIUM 8.9 9.0 8.8* 9.4 8.8* 9.0  MG 2.0  --   --   --   --   --     Liver Function Tests: No results for input(s): AST, ALT, ALKPHOS, BILITOT, PROT, ALBUMIN in the last 168 hours. No results for input(s): LIPASE, AMYLASE in the last 168 hours. No results for input(s): AMMONIA in the last 168 hours.  CBC:  Recent Labs Lab 11/04/16 0130 11/05/16 0434 11/06/16 0428 11/07/16 0511 11/08/16 0404  WBC 11.1* 11.0* 10.2 11.3* 10.8*  HGB 14.5 14.4 14.8 14.5  14.9  HCT 45.1 45.4 46.0 44.9 45.4  MCV 88.6 89.5 88.3 87.9 88.0  PLT 245 235 229 250 228    Cardiac Enzymes: No results for input(s): CKTOTAL, CKMB, CKMBINDEX, TROPONINI in the last 168 hours.  BNP: BNP (last 3 results)  Recent Labs  07/05/16 1549 10/31/16 1005  BNP 327.3* 831.9*    ProBNP (last 3 results) No results for input(s): PROBNP in the last 8760 hours.  Other results:  Imaging: No results found.   Medications:     Scheduled Medications: . atorvastatin  40 mg Oral Daily  . digoxin  0.25 mg Oral Daily  . furosemide  120 mg Intravenous BID  . potassium chloride  40 mEq Oral TID  . sodium chloride flush  10-40 mL Intracatheter Q12H  . sodium chloride flush  3 mL Intravenous Q12H  . sodium chloride flush  3 mL Intravenous Q12H  . spironolactone  25 mg Oral BID  . warfarin  7.5 mg  Oral ONCE-1800  . Warfarin - Pharmacist Dosing Inpatient   Does not apply q1800    Infusions: . sodium chloride 250 mL (11/08/16 0500)  . amiodarone 30 mg/hr (11/08/16 0800)  . milrinone 0.375 mcg/kg/min (11/08/16 0800)    PRN Medications: sodium chloride, acetaminophen, sodium chloride flush, sodium chloride flush, sodium chloride flush, traMADol  Assessment/Plan:   1. 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 Valves poorly visualized. - Coox 51.2% on milrinone 0.375 mcg/kg/min. Will re-send.  - Continue IV lasix 120 mg BID - Continue K supp 40 meq TID.  - Continue spiro 25 mg BID.  - No ACE/ARB with recent AKI. Holding BB with low output.   - Has previously had cough on Entresto. Refuses to re-challenge.   2. A fib RVR - Now back in NSR. Converted on amio while TEE was ongoing.  No DCCV performed.  - Have discussed with EP, not candidate for ablation - Intolerant to Lopressor.  - Rates improved in sinus - Continue digoxin 0.25 mg daily   - Continue coumadin. Dosing per pharm.  Stop heparin with therapeutic INR.   3. Acute/CKD Stage III - Stable with mild up-trend. Continue to follow closely.    4. OSA - Needs nightly CPAP. Not covered by insurance. Case management working on helping with outpatient sleep study. No change to current plan.  5. Super morbid obesity - Needs to lose a significant amount of weight.  6. Noncompliance - Will arrange paramedicine on discharge. No change.   7. Hypokalemia - Resolved. Continue supp while diuresing.   Remains volume overloaded. Creatinine with gentle uptrend. May be able to consider po meds soon.  Will discuss possible RHC as previously mentioned with Dr. Gala Romney. He is on coumadin and his INR is now therapeutic, so would need to be held again if this planned.   Length of Stay: [address]  Graciella Freer, [place]  11/08/2016, 11:18  AM  Advanced Heart Failure Team Pager [phone] (M-F; 7a - 4p)  Please contact [facility] for night-coverage after hours (4p -7a ) and weekends on [email or web address]  Patient seen and examined with the above-signed Advanced Practice Provider and/or Housestaff. I personally reviewed laboratory data, imaging studies and relevant notes. I independently examined the patient and formulated the important aspects of the plan. I have edited the note to reflect any of my changes or salient points. I have personally discussed the plan with the  patient and/or family.  He is back in NSR on amiodarone. TEE results reviewed with him and his wife.   Volume status improving. Weight now down almost 30 pounds. Will continue milrinone until fully diuresed and then start to cut back.   INR therapeutic. Stop heparin. Continue warfarin. Consider RHC prior to d/c. Can do RHC while on warfarin as long as INR 3.0 or less.   Supp K.   [name], MD  8:58 PM

## 2016-11-08 NOTE — Anesthesia Postprocedure Evaluation (Addendum)
Anesthesia Post Note  Patient: Mark Baker  Procedure(s) Performed: Procedure(s) (LRB): CARDIOVERSION (N/A) TRANSESOPHAGEAL ECHOCARDIOGRAM (TEE) (N/A)  Patient location during evaluation: PACU Anesthesia Type: General Level of consciousness: awake Pain management: pain level controlled Vital Signs Assessment: post-procedure vital signs reviewed and stable Respiratory status: spontaneous breathing, nonlabored ventilation, respiratory function stable and patient connected to nasal cannula oxygen Cardiovascular status: blood pressure returned to baseline and stable Postop Assessment: no signs of nausea or vomiting Anesthetic complications: no       Last Vitals:  Vitals:   11/07/16 2300 11/08/16 0300  BP: 122/68 125/88  Pulse:    Resp: 19 18  Temp: 36.7 C 36.9 C    Last Pain:  Vitals:   11/08/16 0400  TempSrc:   PainSc: 0-No pain                 Bee Marchiano

## 2016-11-09 ENCOUNTER — Inpatient Hospital Stay (HOSPITAL_COMMUNITY): Payer: Managed Care, Other (non HMO)

## 2016-11-09 LAB — BASIC METABOLIC PANEL
Anion gap: 11 (ref 5–15)
BUN: 25 mg/dL — ABNORMAL HIGH (ref 6–20)
CO2: 33 mmol/L — ABNORMAL HIGH (ref 22–32)
Calcium: 9.4 mg/dL (ref 8.9–10.3)
Chloride: 85 mmol/L — ABNORMAL LOW (ref 101–111)
Creatinine, Ser: 1.62 mg/dL — ABNORMAL HIGH (ref 0.61–1.24)
GFR calc Af Amer: >60 mL/min (ref >60)
GFR calc non Af Amer: 53 mL/min — ABNORMAL LOW (ref >60)
Glucose, Bld: 195 mg/dL — ABNORMAL HIGH (ref 65–99)
Potassium: 4.7 mmol/L (ref 3.5–5.1)
Sodium: 129 mmol/L — ABNORMAL LOW (ref 135–145)

## 2016-11-09 LAB — CBC
HCT: 47 % (ref 39.0–52.0)
HEMOGLOBIN: 15 g/dL (ref 13.0–17.0)
MCH: 28.2 pg (ref 26.0–34.0)
MCHC: 31.9 g/dL (ref 30.0–36.0)
MCV: 88.3 fL (ref 78.0–100.0)
Platelets: 235 10*3/uL (ref 150–400)
RBC: 5.32 MIL/uL (ref 4.22–5.81)
RDW: 13.1 % (ref 11.5–15.5)
WBC: 7.8 10*3/uL (ref 4.0–10.5)

## 2016-11-09 LAB — COOXEMETRY PANEL
CARBOXYHEMOGLOBIN: 2.1 % — AB (ref 0.5–1.5)
Methemoglobin: 0.6 % (ref 0.0–1.5)
O2 SAT: 59.6 %
Total hemoglobin: 15.7 g/dL (ref 12.0–16.0)

## 2016-11-09 LAB — PROTIME-INR
INR: 2.35
Prothrombin Time: 26.2 seconds — ABNORMAL HIGH (ref 11.4–15.2)

## 2016-11-09 MED ORDER — WARFARIN SODIUM 7.5 MG PO TABS
7.5000 mg | ORAL_TABLET | Freq: Once | ORAL | Status: AC
  Administered 2016-11-09: 7.5 mg via ORAL
  Filled 2016-11-09: qty 1

## 2016-11-09 MED ORDER — POTASSIUM CHLORIDE CRYS ER 20 MEQ PO TBCR
20.0000 meq | EXTENDED_RELEASE_TABLET | Freq: Two times a day (BID) | ORAL | Status: DC
  Administered 2016-11-10: 20 meq via ORAL
  Filled 2016-11-09: qty 1

## 2016-11-09 NOTE — Progress Notes (Signed)
ANTICOAGULATION CONSULT NOTE - Follow Up Consult  Pharmacy Consult for Coumadin Indication: atrial fibrillation  Allergies  Allergen Reactions  . Aspirin Hives, Itching and Rash    Patient Measurements: Height: 6\' 3"  (190.5 cm) Weight: (!) 490 lb (222.3 kg) IBW/kg (Calculated) : 84.5  Vital Signs: Temp: 97.8 F (36.6 C) (03/31 1100) Temp Source: Oral (03/31 1100) BP: 134/89 (03/31 1100) Pulse Rate: 90 (03/31 0320)  Labs:  Recent Labs  11/07/16 0511 11/08/16 0404 11/09/16 0529  HGB 14.5 14.9 15.0  HCT 44.9 45.4 47.0  PLT 250 228 235  LABPROT 23.4* 25.7* 26.2*  INR 2.05 2.30 2.35  HEPARINUNFRC 0.41 0.31  --   CREATININE 1.58* 1.65* 1.62*   Estimated Creatinine Clearance: 124.5 mL/min (A) (by C-G formula based on SCr of 1.62 mg/dL (H)).  Assessment: 35yom continues on coumadin for new afib. Was scheduled for DCCV but converted to NSR so procedure never done. INR is therapeutic and heparin stopped. He continues on IV amiodarone. CBC stable. No bleeding.  INR today: 2.35  Goal of Therapy:  INR goal 2-3 Monitor platelets by anticoagulation protocol: Yes   Plan:  Coumadin 7.5mg  x 1 Daily INR/CBC\ Monitor s/sx of bleeding  Ruben Im, PharmD Clinical Pharmacist Pager: [phone] 11/09/2016 2:53 PM

## 2016-11-09 NOTE — Progress Notes (Signed)
Patient to self-administer CPAP when ready for bed.  Patient is familiar with equipment and procedure.   

## 2016-11-09 NOTE — Progress Notes (Signed)
This nurse called into patients room, pt stated he was trying to change his gown and then realized that "the wires were not connected to him anymore" PICC line found on the floor with all fluids still running. MD paged, order for new PICC line placed.

## 2016-11-09 NOTE — Progress Notes (Signed)
Advanced Heart Failure Rounding Note   Subjective:    Events:  - PICC placed 11/02/16 and moved to SDU. Milrinone started empirically. Coox 38% 11/03/16. Milrinone increased. - Underwent TEE prior to planned DCCV 3/29.  Converted to SR while undergoing TEE. DCCV not performed.  - TEE 11/07/16 EF 20% RV severely HK Massive LA (7.7cm), Severely dilated RA, Normal AV, Severe central MR, Moderate TR (RVSP 65-70), Trivial PR.   Self d/c'd PICC accidentally this am. PICC replaced. Remains in NSR on IV amio. Co-ox 60%. Breathing improved. Weight down another 4 pounds. (30 pounds total). INR 2.3  Creatinine 1.4 -> 1.5 -> 1.6-> 1.6  K 4.7.     Objective:   Weight Range:  Vital Signs:   Temp:  [97.5 F (36.4 C)-98.5 F (36.9 C)] 97.8 F (36.6 C) (03/31 1100) Pulse Rate:  [83-98] 90 (03/31 0320) Resp:  [12-20] 14 (03/31 1100) BP: (119-134)/(72-96) 134/89 (03/31 1100) SpO2:  [85 %-98 %] 97 % (03/31 0320) Weight:  [222.3 kg (490 lb)] 222.3 kg (490 lb) (03/31 0320) Last BM Date: 11/06/16  Weight change: Filed Weights   11/07/16 0340 11/08/16 0300 11/09/16 0320  Weight: (!) 226.1 kg (498 lb 6.4 oz) (!) 224.3 kg (494 lb 8 oz) (!) 222.3 kg (490 lb)    Intake/Output:   Intake/Output Summary (Last 24 hours) at 11/09/16 1251 Last data filed at 11/09/16 1106  Gross per 24 hour  Intake          1558.61 ml  Output             3750 ml  Net         -2191.39 ml     Physical Exam: CVP 15  Reviewed personally.  General: Markedly obese. Sitting in chair. NAD. HEENT: Normal Neck: Supple. JVP unable to measure due to body habitus. Carotids 2+ bilat; no bruits. No thyromegaly or nodule noted.    Cor: PMI non-palpable. Regular. Slightly tachy. Distant HS. No obvious murmur.  Lungs: Distant and diminished. No wheezing  Abdomen: Markedly obese  NT, mild distention, no HSM. No bruits or masses. +BS  Extremities: No cyanosis, clubbing, or rash. 1-2+ chronic edema. Ted hose in place.   Neuro:  Alert & oriented x 3. Cranial nerves grossly intact. Moves all 4 extremities w/o difficulty. Affect pleasant    Telemetry: Personally reviewed, Sinus 90-100s   Labs: Basic Metabolic Panel:  Recent Labs Lab 11/03/16 0358  11/05/16 0434 11/06/16 0428 11/07/16 0511 11/08/16 0404 11/09/16 0529  NA 138  < > 132* 136 133* 133* 129*  K 3.2*  < > 3.0* 4.0 4.4 4.4 4.7  CL 97*  < > 90* 91* 92* 89* 85*  CO2 28  < > 30 32 30 31 33*  GLUCOSE 117*  < > 274* 125* 116* 137* 195*  BUN 39*  < > 23* 22* 24* 26* 25*  CREATININE 1.73*  < > 1.49* 1.49* 1.58* 1.65* 1.62*  CALCIUM 8.9  < > 8.8* 9.4 8.8* 9.0 9.4  MG 2.0  --   --   --   --   --   --   < > = values in this interval not displayed.  Liver Function Tests: No results for input(s): AST, ALT, ALKPHOS, BILITOT, PROT, ALBUMIN in the last 168 hours. No results for input(s): LIPASE, AMYLASE in the last 168 hours. No results for input(s): AMMONIA in the last 168 hours.  CBC:  Recent Labs Lab 11/05/16 0434 11/06/16 0428  11/07/16 0511 11/08/16 0404 11/09/16 0529  WBC 11.0* 10.2 11.3* 10.8* 7.8  HGB 14.4 14.8 14.5 14.9 15.0  HCT 45.4 46.0 44.9 45.4 47.0  MCV 89.5 88.3 87.9 88.0 88.3  PLT 235 229 250 228 235    Cardiac Enzymes: No results for input(s): CKTOTAL, CKMB, CKMBINDEX, TROPONINI in the last 168 hours.  BNP: BNP (last 3 results)  Recent Labs  07/05/16 1549 10/31/16 1005  BNP 327.3* 831.9*    ProBNP (last 3 results) No results for input(s): PROBNP in the last 8760 hours.  Other results:  Imaging: No results found.   Medications:     Scheduled Medications: . atorvastatin  40 mg Oral Daily  . digoxin  0.25 mg Oral Daily  . furosemide  120 mg Intravenous BID  . potassium chloride  40 mEq Oral TID  . sodium chloride flush  10-40 mL Intracatheter Q12H  . sodium chloride flush  3 mL Intravenous Q12H  . sodium chloride flush  3 mL Intravenous Q12H  . spironolactone  25 mg Oral BID  . Warfarin - Pharmacist  Dosing Inpatient   Does not apply q1800    Infusions: . sodium chloride 250 mL (11/09/16 0400)  . amiodarone 30 mg/hr (11/09/16 1214)  . milrinone 0.375 mcg/kg/min (11/09/16 0939)    PRN Medications: sodium chloride, acetaminophen, sodium chloride flush, sodium chloride flush, sodium chloride flush, traMADol  Assessment/Plan:   1. 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 Valves poorly visualized. - No ACE/ARB with recent AKI. Holding BB with low output.   - Has previously had cough on Entresto. Refuses to re-challenge.   2. A fib RVR - Now back in NSR. Converted on amio while TEE was ongoing.  No DCCV performed.  - INR therpaeutic 3. Acute/CKD Stage III - Improved with inotrope support  4. OSA - Needs nightly CPAP. Not covered by insurance. Case management working on helping with outpatient sleep study. No change to current plan.  5. Super morbid obesity - needs to lose weight  6. Noncompliance - Nurse navigator has seen. Likely Paramedicine referral on d/c.  7. Hypokalemia - Resolved. Continue supp while diuresing.    Volume status improving. Weight now down over 30 pounds. CVP still up. Will continue IV lasix and milrinone. Co-ox ok. Renal function stable   Maintaining NSR. Continue amiodarone and warfarin. Consider RHC prior to d/c. Can do RHC while on warfarin as long as INR 3.0 or less.   K fully supped. Can cut back on KCL.     Length of Stay: 9  [name], MD  11/09/2016, 12:51 PM  Advanced Heart Failure Team Pager [phone] (M-F; 7a - 4p)  Please contact [facility] for night-coverage after hours (4p -7a ) and weekends on [email or web address]

## 2016-11-09 NOTE — Progress Notes (Signed)
Peripherally Inserted Central Catheter/Midline Placement  The IV Nurse has discussed with the patient and/or persons authorized to consent for the patient, the purpose of this procedure and the potential benefits and risks involved with this procedure.  The benefits include less needle sticks, lab draws from the catheter, and the patient may be discharged home with the catheter. Risks include, but not limited to, infection, bleeding, blood clot (thrombus formation), and puncture of an artery; nerve damage and irregular heartbeat and possibility to perform a PICC exchange if needed/ordered by physician.  Alternatives to this procedure were also discussed.  Bard Power PICC patient education guide, fact sheet on infection prevention and patient information card has been provided to patient /or left at bedside.  Consent signed from previous PICC placement.  Pt gave verbal consent and discussed the benefits of having the PICC this week.  PICC/Midline Placement Documentation  PICC Triple Lumen 11/09/16 PICC Right Cephalic 44 cm 0 cm (Active)  Indication for Insertion or Continuance of Line Vasoactive infusions;Chronic illness with exacerbations (CF, Sickle Cell, etc.);Prolonged intravenous therapies;Limited venous access - need for IV therapy >5 days (PICC only) 11/09/2016 10:49 AM  Exposed Catheter (cm) 0 cm 11/09/2016 10:49 AM  Site Assessment Clean;Dry;Intact 11/09/2016 10:49 AM  Lumen #1 Status Flushed;Saline locked;Blood return noted 11/09/2016 10:49 AM  Lumen #2 Status Saline locked;Flushed;Blood return noted 11/09/2016 10:49 AM  Lumen #3 Status Flushed;Saline locked;Blood return noted 11/09/2016 10:49 AM  Dressing Type Transparent 11/09/2016 10:49 AM  Dressing Status Clean;Dry;Intact;Antimicrobial disc in place 11/09/2016 10:49 AM  Line Care Connections checked and tightened 11/09/2016 10:49 AM  Line Adjustment (NICU/IV Team Only) No 11/09/2016 10:49 AM  Dressing Intervention New dressing 11/09/2016 10:49 AM   Dressing Change Due 11/16/16 11/09/2016 10:49 AM       Elliot Dally 11/09/2016, 10:51 AM

## 2016-11-10 LAB — DIGOXIN LEVEL: DIGOXIN LVL: 0.3 ng/mL — AB (ref 0.8–2.0)

## 2016-11-10 LAB — BASIC METABOLIC PANEL
Anion gap: 11 (ref 5–15)
BUN: 27 mg/dL — AB (ref 6–20)
CO2: 34 mmol/L — AB (ref 22–32)
CREATININE: 1.75 mg/dL — AB (ref 0.61–1.24)
Calcium: 9.8 mg/dL (ref 8.9–10.3)
Chloride: 85 mmol/L — ABNORMAL LOW (ref 101–111)
GFR calc non Af Amer: 48 mL/min — ABNORMAL LOW (ref >60)
GFR, EST AFRICAN AMERICAN: 56 mL/min — AB (ref >60)
GLUCOSE: 113 mg/dL — AB (ref 65–99)
POTASSIUM: 4.6 mmol/L (ref 3.5–5.1)
SODIUM: 130 mmol/L — AB (ref 135–145)

## 2016-11-10 LAB — COOXEMETRY PANEL
Carboxyhemoglobin: 1.4 % (ref 0.5–1.5)
Methemoglobin: 0.8 % (ref 0.0–1.5)
O2 SAT: 50.3 %
TOTAL HEMOGLOBIN: 16.6 g/dL — AB (ref 12.0–16.0)

## 2016-11-10 LAB — CBC
HEMATOCRIT: 49.2 % (ref 39.0–52.0)
HEMOGLOBIN: 16 g/dL (ref 13.0–17.0)
MCH: 28.3 pg (ref 26.0–34.0)
MCHC: 32.5 g/dL (ref 30.0–36.0)
MCV: 87.1 fL (ref 78.0–100.0)
Platelets: 242 10*3/uL (ref 150–400)
RBC: 5.65 MIL/uL (ref 4.22–5.81)
RDW: 13.4 % (ref 11.5–15.5)
WBC: 8.4 10*3/uL (ref 4.0–10.5)

## 2016-11-10 LAB — PROTIME-INR
INR: 2.58
PROTHROMBIN TIME: 28.2 s — AB (ref 11.4–15.2)

## 2016-11-10 MED ORDER — ISOSORBIDE MONONITRATE ER 30 MG PO TB24
30.0000 mg | ORAL_TABLET | Freq: Every day | ORAL | Status: DC
  Administered 2016-11-10 – 2016-11-11 (×2): 30 mg via ORAL
  Filled 2016-11-10 (×3): qty 1

## 2016-11-10 MED ORDER — HYDRALAZINE HCL 25 MG PO TABS
12.5000 mg | ORAL_TABLET | Freq: Three times a day (TID) | ORAL | Status: DC
  Administered 2016-11-10 – 2016-11-11 (×3): 12.5 mg via ORAL
  Filled 2016-11-10 (×3): qty 1

## 2016-11-10 MED ORDER — WARFARIN SODIUM 7.5 MG PO TABS
7.5000 mg | ORAL_TABLET | Freq: Once | ORAL | Status: AC
  Administered 2016-11-10: 7.5 mg via ORAL
  Filled 2016-11-10: qty 1

## 2016-11-10 NOTE — Progress Notes (Signed)
ANTICOAGULATION CONSULT NOTE - Follow Up Consult  Pharmacy Consult for Coumadin Indication: atrial fibrillation  Allergies  Allergen Reactions  . Aspirin Hives, Itching and Rash   Patient Measurements: Height: 6\' 3"  (190.5 cm) Weight: (!) 480 lb 11.2 oz (218 kg) IBW/kg (Calculated) : 84.5  Vital Signs: Temp: 97.2 F (36.2 C) (04/01 1254) Temp Source: Oral (04/01 1254) BP: 105/61 (04/01 1254) Pulse Rate: 102 (04/01 1254)  Labs:  Recent Labs  11/08/16 0404 11/09/16 0529 11/10/16 0500  HGB 14.9 15.0 16.0  HCT 45.4 47.0 49.2  PLT 228 235 242  LABPROT 25.7* 26.2* 28.2*  INR 2.30 2.35 2.58  HEPARINUNFRC 0.31  --   --   CREATININE 1.65* 1.62* 1.75*   Estimated Creatinine Clearance: 113.8 mL/min (A) (by C-G formula based on SCr of 1.75 mg/dL (H)).  Assessment: 35yom continues on coumadin for new afib. Was scheduled for DCCV but converted to NSR so procedure never done. INR remains therapeutic with slight trend up. He continues on IV amiodarone. CBC stable. No bleeding.  INR today: 2.58  Goal of Therapy:  INR goal 2-3 Monitor platelets by anticoagulation protocol: Yes   Plan:  Coumadin 7.5mg  x 1 Daily INR/CBC Monitor s/sx of bleeding  Ruben Im, PharmD Clinical Pharmacist Pager: [phone] 11/10/2016 2:09 PM

## 2016-11-10 NOTE — Progress Notes (Addendum)
Advanced Heart Failure Rounding Note   Subjective:    Events:  - PICC placed 11/02/16 and moved to SDU. Milrinone started empirically. Coox 38% 11/03/16. Milrinone increased. - Underwent TEE prior to planned DCCV 3/29.  Converted to SR while undergoing TEE. DCCV not performed.  - TEE 11/07/16 EF 20% RV severely HK Massive LA (7.7cm), Severely dilated RA, Normal AV, Severe central MR, Moderate TR (RVSP 65-70), Trivial PR.   Remains in NSR on amio. Continue to diurese profusely. Weight down another 10 pounds (now 40 pounds down). Denis dyspnea, orthopnea or PND. Co-ox 50%  INR 2.6. CVP 8  Creatinine 1.4 -> 1.5 -> 1.6-> 1.6 -> 1.7   K 4.6.     Objective:   Weight Range:  Vital Signs:   Temp:  [97.8 F (36.6 C)-98.8 F (37.1 C)] 98.3 F (36.8 C) (04/01 0839) Pulse Rate:  [81-101] 88 (04/01 1133) Resp:  [16] 16 (03/31 1519) BP: (106-132)/(71-91) 120/81 (04/01 1133) SpO2:  [92 %-99 %] 99 % (04/01 0839) Weight:  [218 kg (480 lb 11.2 oz)] 218 kg (480 lb 11.2 oz) (04/01 0500) Last BM Date: 11/10/16  Weight change: Filed Weights   11/08/16 0300 11/09/16 0320 11/10/16 0500  Weight: (!) 224.3 kg (494 lb 8 oz) (!) 222.3 kg (490 lb) (!) 218 kg (480 lb 11.2 oz)    Intake/Output:   Intake/Output Summary (Last 24 hours) at 11/10/16 1240 Last data filed at 11/10/16 1200  Gross per 24 hour  Intake          2021.83 ml  Output             6675 ml  Net         -4653.17 ml     Physical Exam: CVP 8   Personally reviewed General: Markedly obese. Sitting in chair NAD HEENT: Normal anicteric Neck: Supple. JVP unable to measure due to body habitus. Carotids 2+ bilat; no bruits. No thyromegaly or nodule noted.    Cor: PMI non-palpable. Regular. Slightly tachy. Distant HS. No obvious murmur.  Lungs: Distant and diminished. No wheeze Abdomen: Markedly obese  NT, nondistended no HSM. No bruits or masses. +BS  Extremities: No cyanosis, clubbing, or rash. trace edema. Ted hose in place.     Neuro: Alert & oriented x 3. Cranial nerves grossly intact. Moves all 4 extremities w/o difficulty. Affect pleasant    Telemetry: Sinus 90-105 Personally reviewed   Labs: Basic Metabolic Panel:  Recent Labs Lab 11/06/16 0428 11/07/16 0511 11/08/16 0404 11/09/16 0529 11/10/16 0500  NA 136 133* 133* 129* 130*  K 4.0 4.4 4.4 4.7 4.6  CL 91* 92* 89* 85* 85*  CO2 32 30 31 33* 34*  GLUCOSE 125* 116* 137* 195* 113*  BUN 22* 24* 26* 25* 27*  CREATININE 1.49* 1.58* 1.65* 1.62* 1.75*  CALCIUM 9.4 8.8* 9.0 9.4 9.8    Liver Function Tests: No results for input(s): AST, ALT, ALKPHOS, BILITOT, PROT, ALBUMIN in the last 168 hours. No results for input(s): LIPASE, AMYLASE in the last 168 hours. No results for input(s): AMMONIA in the last 168 hours.  CBC:  Recent Labs Lab 11/06/16 0428 11/07/16 0511 11/08/16 0404 11/09/16 0529 11/10/16 0500  WBC 10.2 11.3* 10.8* 7.8 8.4  HGB 14.8 14.5 14.9 15.0 16.0  HCT 46.0 44.9 45.4 47.0 49.2  MCV 88.3 87.9 88.0 88.3 87.1  PLT 229 250 228 235 242    Cardiac Enzymes: No results for input(s): CKTOTAL, CKMB, CKMBINDEX, TROPONINI in the  last 168 hours.  BNP: BNP (last 3 results)  Recent Labs  07/05/16 1549 10/31/16 1005  BNP 327.3* 831.9*    ProBNP (last 3 results) No results for input(s): PROBNP in the last 8760 hours.  Other results:  Imaging: No results found.   Medications:     Scheduled Medications: . atorvastatin  40 mg Oral Daily  . digoxin  0.25 mg Oral Daily  . furosemide  120 mg Intravenous BID  . potassium chloride  20 mEq Oral BID  . sodium chloride flush  10-40 mL Intracatheter Q12H  . sodium chloride flush  3 mL Intravenous Q12H  . sodium chloride flush  3 mL Intravenous Q12H  . spironolactone  25 mg Oral BID  . Warfarin - Pharmacist Dosing Inpatient   Does not apply q1800    Infusions: . sodium chloride 250 mL (11/09/16 0400)  . amiodarone 30 mg/hr (11/10/16 1135)  . milrinone 0.375 mcg/kg/min  (11/10/16 1232)    PRN Medications: sodium chloride, acetaminophen, sodium chloride flush, sodium chloride flush, sodium chloride flush, traMADol  Assessment/Plan:   1. 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 Valves poorly visualized. - No ACE/ARB with recent AKI. Holding BB with low output.   - Has previously had cough on Entresto. Refuses to re-challenge.   2. Paroxysmal A fib RVR - Now back in NSR. Converted on amio while TEE was ongoing.  No DCCV performed.  - INR therpaeutic 3. Acute/CKD Stage III - Improved with inotrope support  4. OSA - Needs nightly CPAP. Not covered by insurance. Case management working on helping with outpatient sleep study. No change to current plan.  5. Super morbid obesity - needs to lose weight  6. Noncompliance - Nurse navigator has seen. Likely Paramedicine referral on d/c.  7. Hypokalemia - Resolved. Continue supp while diuresing.    Volume status back to baseline.  Weight now down 40 pounds. CVP now 8-9. Likely as low as we will get it. Will stop lasix today and let him equilibrate.  Co-ox down despite milrinone. Hopefully will improve as volume status increases. May need to consider RHC and long-term inotropes. Will start hydralazine nitrates for afterload reduction. Continue digoxin. Long talk about possible need for home inotropes.   Maintaining NSR. Continue amiodarone and warfarin. Consider RHC prior to d/c. Can do RHC while on warfarin as long as INR 3.0 or less.  INR 2.58   Length of Stay: 10  [name], MD  11/10/2016, 12:40 PM  Advanced Heart Failure Team Pager [phone] (M-F; 7a - 4p)  Please contact [facility] for night-coverage after hours (4p -7a ) and weekends on [email or web address]

## 2016-11-10 NOTE — Progress Notes (Signed)
Patient to self-administer CPAP when ready for bed.  Patient is familiar with equipment and procedure.   

## 2016-11-11 ENCOUNTER — Inpatient Hospital Stay (HOSPITAL_COMMUNITY): Admission: RE | Admit: 2016-11-11 | Payer: Managed Care, Other (non HMO) | Source: Ambulatory Visit

## 2016-11-11 LAB — BASIC METABOLIC PANEL
Anion gap: 12 (ref 5–15)
BUN: 28 mg/dL — AB (ref 6–20)
CALCIUM: 9.3 mg/dL (ref 8.9–10.3)
CO2: 32 mmol/L (ref 22–32)
Chloride: 86 mmol/L — ABNORMAL LOW (ref 101–111)
Creatinine, Ser: 1.79 mg/dL — ABNORMAL HIGH (ref 0.61–1.24)
GFR calc Af Amer: 55 mL/min — ABNORMAL LOW (ref >60)
GFR, EST NON AFRICAN AMERICAN: 47 mL/min — AB (ref >60)
GLUCOSE: 113 mg/dL — AB (ref 65–99)
Potassium: 3.9 mmol/L (ref 3.5–5.1)
Sodium: 130 mmol/L — ABNORMAL LOW (ref 135–145)

## 2016-11-11 LAB — CBC
HCT: 48.2 % (ref 39.0–52.0)
Hemoglobin: 15.8 g/dL (ref 13.0–17.0)
MCH: 28.4 pg (ref 26.0–34.0)
MCHC: 32.8 g/dL (ref 30.0–36.0)
MCV: 86.7 fL (ref 78.0–100.0)
Platelets: 230 10*3/uL (ref 150–400)
RBC: 5.56 MIL/uL (ref 4.22–5.81)
RDW: 13.4 % (ref 11.5–15.5)
WBC: 8.8 10*3/uL (ref 4.0–10.5)

## 2016-11-11 LAB — COOXEMETRY PANEL
CARBOXYHEMOGLOBIN: 1.4 % (ref 0.5–1.5)
Methemoglobin: 0.7 % (ref 0.0–1.5)
O2 Saturation: 63.2 %
TOTAL HEMOGLOBIN: 16.2 g/dL — AB (ref 12.0–16.0)

## 2016-11-11 LAB — PROTIME-INR
INR: 2.6
Prothrombin Time: 28.3 seconds — ABNORMAL HIGH (ref 11.4–15.2)

## 2016-11-11 MED ORDER — HYDRALAZINE HCL 25 MG PO TABS
25.0000 mg | ORAL_TABLET | Freq: Three times a day (TID) | ORAL | Status: DC
  Administered 2016-11-11 – 2016-11-12 (×3): 25 mg via ORAL
  Filled 2016-11-11 (×3): qty 1

## 2016-11-11 MED ORDER — WARFARIN SODIUM 7.5 MG PO TABS
7.5000 mg | ORAL_TABLET | Freq: Once | ORAL | Status: AC
  Administered 2016-11-11: 7.5 mg via ORAL
  Filled 2016-11-11: qty 1

## 2016-11-11 MED ORDER — TORSEMIDE 20 MG PO TABS
40.0000 mg | ORAL_TABLET | Freq: Every day | ORAL | Status: DC
  Administered 2016-11-11 – 2016-11-14 (×4): 40 mg via ORAL
  Filled 2016-11-11 (×4): qty 2

## 2016-11-11 NOTE — Progress Notes (Signed)
Advanced Heart Failure Rounding Note   Subjective:    Events: - PICC placed 11/02/16 and moved to SDU. Milrinone started empirically. Coox 38% 11/03/16. Milrinone increased. - Underwent TEE prior to planned DCCV 3/29.  Converted to SR while undergoing TEE. DCCV not performed.  - TEE 11/07/16 EF 20% RV severely HK Massive LA (7.7cm), Severely dilated RA, Normal AV, Severe central MR, Moderate TR (RVSP 65-70), Trivial PR.   Yesterday he continued to diurese on IV lasix. Negative 1.3 liters. Weight down 40 pounds. CVP 7-8. Maintaining NSR. Creatinine stable.   Feeling better. Denies SOB.   Objective:   Weight Range:  Vital Signs:   Temp:  [96.8 F (36 C)-98.6 F (37 C)] 98.4 F (36.9 C) (04/02 0752) Pulse Rate:  [79-102] 92 (04/02 0752) Resp:  [19-21] 21 (04/02 0314) BP: (99-128)/(57-88) 112/69 (04/02 0752) SpO2:  [92 %-95 %] 94 % (04/02 0314) Weight:  [481 lb (218.2 kg)] 481 lb (218.2 kg) (04/02 0314) Last BM Date: 11/10/16  Weight change: Filed Weights   11/09/16 0320 11/10/16 0500 11/11/16 0314  Weight: (!) 490 lb (222.3 kg) (!) 480 lb 11.2 oz (218 kg) (!) 481 lb (218.2 kg)    Intake/Output:   Intake/Output Summary (Last 24 hours) at 11/11/16 0955 Last data filed at 11/11/16 0600  Gross per 24 hour  Intake           1747.2 ml  Output             3375 ml  Net          -1627.8 ml     Physical Exam: CVP 7-8 personally reviewed.  General: Obese, NAD. Sitting in the chair.  HEENT: Normal anicteric Neck: Supple. JVP hard to assess due to body habitus.  Carotids 2+ bilat; no bruits. No thyromegaly or nodule noted.    Cor: PMI non-palpable. Regular. Distant HS.   Lungs: Decreased in the bases.  Abdomen: Obese, NT, ND, + BS. No bruits or masses. +BS  Extremities: No cyanosis, clubbing, or rash. R and LLE with compression wraps.    Neuro: Alert & oriented x 3. Cranial nerves grossly intact. Moves all 4 extremities w/o difficulty. Affect pleasant    Telemetry: Sinus  80-100s. Personally reviewed.    Labs: Basic Metabolic Panel:  Recent Labs Lab 11/07/16 0511 11/08/16 0404 11/09/16 0529 11/10/16 0500 11/11/16 0541  NA 133* 133* 129* 130* 130*  K 4.4 4.4 4.7 4.6 3.9  CL 92* 89* 85* 85* 86*  CO2 30 31 33* 34* 32  GLUCOSE 116* 137* 195* 113* 113*  BUN 24* 26* 25* 27* 28*  CREATININE 1.58* 1.65* 1.62* 1.75* 1.79*  CALCIUM 8.8* 9.0 9.4 9.8 9.3    Liver Function Tests: No results for input(s): AST, ALT, ALKPHOS, BILITOT, PROT, ALBUMIN in the last 168 hours. No results for input(s): LIPASE, AMYLASE in the last 168 hours. No results for input(s): AMMONIA in the last 168 hours.  CBC:  Recent Labs Lab 11/07/16 0511 11/08/16 0404 11/09/16 0529 11/10/16 0500 11/11/16 0541  WBC 11.3* 10.8* 7.8 8.4 8.8  HGB 14.5 14.9 15.0 16.0 15.8  HCT 44.9 45.4 47.0 49.2 48.2  MCV 87.9 88.0 88.3 87.1 86.7  PLT 250 228 235 242 230    Cardiac Enzymes: No results for input(s): CKTOTAL, CKMB, CKMBINDEX, TROPONINI in the last 168 hours.  BNP: BNP (last 3 results)  Recent Labs  07/05/16 1549 10/31/16 1005  BNP 327.3* 831.9*    ProBNP (last 3 results)  No results for input(s): PROBNP in the last 8760 hours.  Other results:  Imaging: No results found.   Medications:     Scheduled Medications: . atorvastatin  40 mg Oral Daily  . digoxin  0.25 mg Oral Daily  . hydrALAZINE  12.5 mg Oral Q8H  . isosorbide mononitrate  30 mg Oral Daily  . sodium chloride flush  10-40 mL Intracatheter Q12H  . sodium chloride flush  3 mL Intravenous Q12H  . sodium chloride flush  3 mL Intravenous Q12H  . spironolactone  25 mg Oral BID  . Warfarin - Pharmacist Dosing Inpatient   Does not apply q1800    Infusions: . sodium chloride 250 mL (11/09/16 0400)  . amiodarone 30 mg/hr (11/10/16 2324)  . milrinone 0.375 mcg/kg/min (11/11/16 0734)    PRN Medications: sodium chloride, acetaminophen, sodium chloride flush, sodium chloride flush, sodium chloride flush,  traMADol  Assessment/Plan:   1. 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 Valves poorly visualized. Todays CO-OX is 63%. CVP 7-8. Stop IV lasix and start torsemide 40 mg daily. Prior to admit he was on po lasix 80 mg twice a day.  Cut back milrinone to 0.25 mcg.  Increase hydralazine 25 mg three times a day + imdur 30 mg daily.  No Ace/ARB. Off BB with low output.  - Tried entresto in the past but later stopped due to cough. Refuses rechallenge.   2. Paroxysmal A fib RVR - In NSR. Chemically converted on amio. INR 2.6 on coumadin.  3. Acute/CKD Stage III Creatinine unchanged at 1.7. Stopping IV lasix.  4. OSA - Needs CPAP but its not covered by insurance. Case management working on helping with outpatient sleep study. No change to current plan.  5. Super morbid obesity - needs to lose weight . As an outpatient will try to refer for bariatric surgery.  6. Noncompliance - Nurse navigator has seen. Likely Paramedicine referral on d/c.  7. Hypokalemia - K 3.9    Continue to wean milrinone over the next few days. Consult cardiac rehab.    Length of Stay: 11  Amy Clegg, NP  11/11/2016, 9:55 AM  Advanced Heart Failure Team Pager [phone] (M-F; 7a - 4p)  Please contact [facility] for night-coverage after hours (4p -7a ) and weekends on [email or web address]  Patient seen and examined with Tonye Becket, NP. We discussed all aspects of the encounter. I agree with the assessment and plan as stated above.   He is improving. CVP about as low as we will get it. Co-ox ok. Begin milrinone wean. Switch to po torsemide. Titrate hydral and  imdur. Continue amio for PAF. No bblocker or ACE due to low output and AKI. On coumadin. INR 2.6.  [name], MD  10:32 PM

## 2016-11-11 NOTE — Progress Notes (Signed)
ANTICOAGULATION CONSULT NOTE - Follow Up Consult  Pharmacy Consult for Coumadin Indication: atrial fibrillation  Allergies  Allergen Reactions  . Aspirin Hives, Itching and Rash   Patient Measurements: Height: 6\' 3"  (190.5 cm) Weight: (!) 481 lb (218.2 kg) IBW/kg (Calculated) : 84.5  Vital Signs: Temp: 98.4 F (36.9 C) (04/02 0752) Temp Source: Oral (04/02 0752) BP: 112/69 (04/02 0752) Pulse Rate: 92 (04/02 0752)  Labs:  Recent Labs  11/09/16 0529 11/10/16 0500 11/11/16 0541  HGB 15.0 16.0 15.8  HCT 47.0 49.2 48.2  PLT 235 242 230  LABPROT 26.2* 28.2* 28.3*  INR 2.35 2.58 2.60  CREATININE 1.62* 1.75* 1.79*   Estimated Creatinine Clearance: 111.4 mL/min (A) (by C-G formula based on SCr of 1.79 mg/dL (H)).  Assessment: 35yom continues on coumadin for new afib. INR remains therapeutic with slight trend up. He continues on IV amiodarone. CBC stable. No bleeding.  INR today: 2.6  Goal of Therapy:  INR goal 2-3 Monitor platelets by anticoagulation protocol: Yes   Plan:  Continue Coumadin 7.5mg   Daily INR/CBC Monitor s/sx of bleeding  Sheppard Coil PharmD., BCPS Clinical Pharmacist Pager [phone] 11/11/2016 10:18 AM

## 2016-11-11 NOTE — Progress Notes (Signed)
Patient to self administer CPAP.  Patient is familiar with equipment and procedure. 

## 2016-11-11 NOTE — Progress Notes (Signed)
CARDIAC REHAB PHASE I   PRE:  Rate/Rhythm: 104 ST    BP: sitting 115/80    SaO2: 93 RA  MODE:  Ambulation: 540 ft   POST:  Rate/Rhythm: 123 ST    BP: sitting 110/79     SaO2: 94 RA  Tolerated well, independent. Denied SOB. Ed completed/reviewed. Gave walking gl and discussed CRPII. Will send referral to [place] [facility]. Reviewed low sodium and daily wts. He has been reading his materials. We also discussed investigating sodium in restaurants before eating there. Pt receptive. Encouraged more walking on his own. [phone]   Harriet Masson CES, ACSM 11/11/2016 11:34 AM

## 2016-11-11 NOTE — Progress Notes (Signed)
RT filled CPAP humidifier with sterile water and CPAP is setup and ready at patient's bedside. Patient stated he would place mask on himself when he is ready for bed. RT advised patient to call if assistance is needed. RT will monitor as needed.

## 2016-11-12 ENCOUNTER — Encounter (HOSPITAL_COMMUNITY): Payer: Self-pay | Admitting: *Deleted

## 2016-11-12 LAB — BASIC METABOLIC PANEL
Anion gap: 12 (ref 5–15)
BUN: 28 mg/dL — AB (ref 6–20)
CHLORIDE: 89 mmol/L — AB (ref 101–111)
CO2: 30 mmol/L (ref 22–32)
CREATININE: 1.71 mg/dL — AB (ref 0.61–1.24)
Calcium: 9.5 mg/dL (ref 8.9–10.3)
GFR calc Af Amer: 58 mL/min — ABNORMAL LOW (ref >60)
GFR calc non Af Amer: 50 mL/min — ABNORMAL LOW (ref >60)
GLUCOSE: 111 mg/dL — AB (ref 65–99)
POTASSIUM: 4 mmol/L (ref 3.5–5.1)
Sodium: 131 mmol/L — ABNORMAL LOW (ref 135–145)

## 2016-11-12 LAB — CBC
HEMATOCRIT: 49.8 % (ref 39.0–52.0)
Hemoglobin: 16.2 g/dL (ref 13.0–17.0)
MCH: 28.4 pg (ref 26.0–34.0)
MCHC: 32.5 g/dL (ref 30.0–36.0)
MCV: 87.4 fL (ref 78.0–100.0)
Platelets: 237 10*3/uL (ref 150–400)
RBC: 5.7 MIL/uL (ref 4.22–5.81)
RDW: 13.5 % (ref 11.5–15.5)
WBC: 9.4 10*3/uL (ref 4.0–10.5)

## 2016-11-12 LAB — COOXEMETRY PANEL
Carboxyhemoglobin: 1.4 % (ref 0.5–1.5)
Carboxyhemoglobin: 1.5 % (ref 0.5–1.5)
METHEMOGLOBIN: 0.8 % (ref 0.0–1.5)
Methemoglobin: 0.8 % (ref 0.0–1.5)
O2 Saturation: 51.3 %
O2 Saturation: 55.4 %
TOTAL HEMOGLOBIN: 9.8 g/dL — AB (ref 12.0–16.0)
Total hemoglobin: 16.2 g/dL — ABNORMAL HIGH (ref 12.0–16.0)

## 2016-11-12 LAB — PROTIME-INR
INR: 2.62
Prothrombin Time: 28.5 seconds — ABNORMAL HIGH (ref 11.4–15.2)

## 2016-11-12 MED ORDER — HYDRALAZINE HCL 25 MG PO TABS
37.5000 mg | ORAL_TABLET | Freq: Three times a day (TID) | ORAL | Status: DC
  Administered 2016-11-12 – 2016-11-13 (×3): 37.5 mg via ORAL
  Filled 2016-11-12 (×3): qty 2

## 2016-11-12 MED ORDER — ISOSORBIDE MONONITRATE ER 60 MG PO TB24
60.0000 mg | ORAL_TABLET | Freq: Every day | ORAL | Status: DC
  Administered 2016-11-12 – 2016-11-14 (×3): 60 mg via ORAL
  Filled 2016-11-12 (×3): qty 1

## 2016-11-12 MED ORDER — WARFARIN SODIUM 7.5 MG PO TABS
7.5000 mg | ORAL_TABLET | Freq: Once | ORAL | Status: AC
  Administered 2016-11-12: 7.5 mg via ORAL
  Filled 2016-11-12: qty 1

## 2016-11-12 MED ORDER — HYDROCORTISONE 1 % EX CREA
1.0000 | TOPICAL_CREAM | Freq: Four times a day (QID) | CUTANEOUS | Status: DC
Start: 2016-11-12 — End: 2016-11-14
  Administered 2016-11-12 – 2016-11-14 (×7): 1 via TOPICAL
  Filled 2016-11-12: qty 28

## 2016-11-12 MED ORDER — AMIODARONE HCL 200 MG PO TABS
400.0000 mg | ORAL_TABLET | Freq: Two times a day (BID) | ORAL | Status: DC
  Administered 2016-11-12 – 2016-11-13 (×4): 400 mg via ORAL
  Filled 2016-11-12 (×4): qty 2

## 2016-11-12 NOTE — Progress Notes (Signed)
CARDIAC REHAB PHASE I   PRE:  Rate/Rhythm: 98 SR  BP:  Supine:   Sitting: 127/88  Standing:    SaO2: 94%RA  MODE:  Ambulation: 680 ft   POST:  Rate/Rhythm: 119 ST  BP:  Supine:   Sitting: 133/89  Standing:    SaO2: 95%RA 1353-1415 Pt walked 680 ft independently with steady gait. Tolerated well. Denied SOB. To recliner after walk.   Luetta Nutting, RN BSN  11/12/2016 2:11 PM

## 2016-11-12 NOTE — Progress Notes (Signed)
Received Disability/FMLA forms from [employer] on 11/05/2016.  Forms completed and faxed along with requested medical records today to fax # [phone].  Claim Number:  [id number].  Original forms will be scanned to patient's electronic medical record.

## 2016-11-12 NOTE — Discharge Instructions (Signed)
Heart Failure Heart failure means your heart has trouble pumping blood. This makes it hard for your body to work well. Heart failure is usually a long-term (chronic) condition. You must take good care of yourself and follow your doctor's treatment plan. Follow these instructions at home:  Take your heart medicine as told by your doctor.  Do not stop taking medicine unless your doctor tells you to.  Do not skip any dose of medicine.  Refill your medicines before they run out.  Take other medicines only as told by your doctor or pharmacist.  Stay active if told by your doctor. The elderly and people with severe heart failure should talk with a doctor about physical activity.  Eat heart-healthy foods. Choose foods that are without trans fat and are low in saturated fat, cholesterol, and salt (sodium). This includes fresh or frozen fruits and vegetables, fish, lean meats, fat-free or low-fat dairy foods, whole grains, and high-fiber foods. Lentils and dried peas and beans (legumes) are also good choices.  Limit salt if told by your doctor.  Cook in a healthy way. Roast, grill, broil, bake, poach, steam, or stir-fry foods.  Limit fluids as told by your doctor.  Weigh yourself every morning. Do this after you pee (urinate) and before you eat breakfast. Write down your weight to give to your doctor.  Take your blood pressure and write it down if your doctor tells you to.  Ask your doctor how to check your pulse. Check your pulse as told.  Lose weight if told by your doctor.  Stop smoking or chewing tobacco. Do not use gum or patches that help you quit without your doctor's approval.  Schedule and go to doctor visits as told.  Nonpregnant women should have no more than 1 drink a day. Men should have no more than 2 drinks a day. Talk to your doctor about drinking alcohol.  Stop illegal drug use.  Stay current with shots (immunizations).  Manage your health conditions as told by your  doctor.  Learn to manage your stress.  Rest when you are tired.  If it is really hot outside:  Avoid intense activities.  Use air conditioning or fans, or get in a cooler place.  Avoid caffeine and alcohol.  Wear loose-fitting, lightweight, and light-colored clothing.  If it is really cold outside:  Avoid intense activities.  Layer your clothing.  Wear mittens or gloves, a hat, and a scarf when going outside.  Avoid alcohol.  Learn about heart failure and get support as needed.  Get help to maintain or improve your quality of life and your ability to care for yourself as needed. Contact a doctor if:  You gain weight quickly.  You are more short of breath than usual.  You cannot do your normal activities.  You tire easily.  You cough more than normal, especially with activity.  You have any or more puffiness (swelling) in areas such as your hands, feet, ankles, or belly (abdomen).  You cannot sleep because it is hard to breathe.  You feel like your heart is beating fast (palpitations).  You get dizzy or light-headed when you stand up. Get help right away if:  You have trouble breathing.  There is a change in mental status, such as becoming less alert or not being able to focus.  You have chest pain or discomfort.  You faint. This information is not intended to replace advice given to you by your health care provider. Make sure you  discuss any questions you have with your health care provider. Document Released: 05/07/2008 Document Revised: 01/04/2016 Document Reviewed: 09/14/2012 Elsevier Interactive Patient Education  2017 Else[employer]. Warfarin tablets What is this medicine? WARFARIN (WAR far in) is an anticoagulant. It is used to treat or prevent clots in the veins, arteries, lungs, or heart. This medicine may be used for other purposes; ask your health care provider or pharmacist if you have questions. COMMON BRAND NAME(S): Coumadin, Jantoven What  should I tell my health care provider before I take this medicine? They need to know if you have any of these conditions: -alcoholism -anemia -bleeding disorders -cancer -diabetes -heart disease -high blood pressure -history of bleeding in the gastrointestinal tract -history of stroke or other brain injury or disease -kidney or liver disease -protein C deficiency -protein S deficiency -psychosis or dementia -recent injury, recent or planned surgery or procedure -an unusual or allergic reaction to warfarin, other medicines, foods, dyes, or preservatives -pregnant or trying to get pregnant -breast-feeding How should I use this medicine? Take this medicine by mouth with a glass of water. Follow the directions on the prescription label. You can take this medicine with or without food. Take your medicine at the same time each day. Do not take it more often than directed. Do not stop taking except on your doctor's advice. Stopping this medicine may increase your risk of a blood clot. Be sure to refill your prescription before you run out of medicine. If your doctor or healthcare professional calls to change your dose, write down the dose and any other instructions. Always read the dose and instructions back to him or her to make sure you understand them. Tell your doctor or healthcare professional what strength of tablets you have on hand. Ask how many tablets you should take to equal your new dose. Write the date on the new instructions and keep them near your medicine. If you are told to stop taking your medicine until your next blood test, call your doctor or healthcare professional if you do not hear anything within 24 hours of the test to find out your new dose or when to restart your prior dose. A special MedGuide will be given to you by the pharmacist with each prescription and refill. Be sure to read this information carefully each time. Talk to your pediatrician regarding the use of this  medicine in children. Special care may be needed. Overdosage: If you think you have taken too much of this medicine contact a poison control center or emergency room at once. NOTE: This medicine is only for you. Do not share this medicine with others. What if I miss a dose? It is important not to miss a dose. If you miss a dose, call your healthcare provider. Take the dose as soon as possible on the same day. If it is almost time for your next dose, take only that dose. Do not take double or extra doses to make up for a missed dose. What may interact with this medicine? Do not take this medicine with any of the following medications: -agents that prevent or dissolve blood clots -aspirin or other salicylates -danshen -dextrothyroxine -mifepristone -St. John's Wort -red yeast rice This medicine may also interact with the following medications: -acetaminophen -agents that lower cholesterol -alcohol -allopurinol -amiodarone -antibiotics or medicines for treating bacterial, fungal or viral infections -azathioprine -barbiturate medicines for inducing sleep or treating seizures -certain medicines for diabetes -certain medicines for heart rhythm problems -certain medicines for hepatitis  C virus infections like daclatasvir, dasabuvir; ombitasvir; paritaprevir; ritonavir, elbasvir; grazoprevir, ledipasvir; sofosbuvir, simeprevir, sofosbuvir, sofosbuvir; velpatasvir, sofosbuvir; velpatasvir; voxilaprevir -certain medicines for high blood pressure -chloral hydrate -cisapride -conivaptan -disulfiram -male hormones, including contraceptive or birth control pills -general anesthetics -herbal or dietary products like garlic, ginkgo, ginseng, green tea, or kava kava -influenza virus vaccine -male hormones -medicines for mental depression or psychosis -medicines for some types of cancer -medicines for stomach problems -methylphenidate -NSAIDs, medicines for pain and inflammation, like  ibuprofen or naproxen -propoxyphene -quinidine, quinine -raloxifene -seizure or epilepsy medicine like carbamazepine, phenytoin, and valproic acid -steroids like cortisone and prednisone -tamoxifen -thyroid medicine -tramadol -vitamin c, vitamin e, and vitamin K -zafirlukast -zileuton This list may not describe all possible interactions. Give your health care provider a list of all the medicines, herbs, non-prescription drugs, or dietary supplements you use. Also tell them if you smoke, drink alcohol, or use illegal drugs. Some items may interact with your medicine. What should I watch for while using this medicine? Visit your doctor or health care professional for regular checks on your progress. You will need to have a blood test called a PT/INR regularly. The PT/INR blood test is done to make sure you are getting the right dose of this medicine. It is important to not miss your appointment for the blood tests. When you first start taking this medicine, these tests are done often. Once the correct dose is determined and you take your medicine properly, these tests can be done less often. Notify your doctor or health care professional and seek emergency treatment if you develop breathing problems; changes in vision; chest pain; severe, sudden headache; pain, swelling, warmth in the leg; trouble speaking; sudden numbness or weakness of the face, arm or leg. These can be signs that your condition has gotten worse. While you are taking this medicine, carry an identification card with your name, the name and dose of medicine(s) being used, and the name and phone number of your doctor or health care professional or person to contact in an emergency. Do not start taking or stop taking any medicines or over-the-counter medicines except on the advice of your doctor or health care professional. You should discuss your diet with your doctor or health care professional. Do not make major changes in your  diet. Vitamin K can affect how well this medicine works. Many foods contain vitamin K. It is important to eat a consistent amount of foods with vitamin K. Other foods with vitamin K that you should eat in consistent amounts are asparagus, basil, black eyed peas, broccoli, brussel sprouts, cabbage, green onions, green tea, parsley, green leafy vegetables like beet greens, collard greens, kale, spinach, turnip greens, or certain lettuces like green leaf or romaine. This medicine can cause birth defects or bleeding in an unborn child. Women of childbearing age should use effective birth control while taking this medicine. If a woman becomes pregnant while taking this medicine, she should discuss the potential risks and her options with her health care professional. Avoid sports and activities that might cause injury while you are using this medicine. Severe falls or injuries can cause unseen bleeding. Be careful when using sharp tools or knives. Consider using an Neurosurgeon. Take special care brushing or flossing your teeth. Report any injuries, bruising, or red spots on the skin to your doctor or health care professional. If you have an illness that causes vomiting, diarrhea, or fever for more than a few days, contact your doctor. Also  check with your doctor if you are unable to eat for several days. These problems can change the effect of this medicine. Even after you stop taking this medicine, it takes several days before your body recovers its normal ability to clot blood. Ask your doctor or health care professional how long you need to be careful. If you are going to have surgery or dental work, tell your doctor or health care professional that you have been taking this medicine. What side effects may I notice from receiving this medicine? Side effects that you should report to your doctor or health care professional as soon as possible: -allergic reactions like skin rash, itching or hives, swelling of  the face, lips, or tongue -breathing problems -chest pain -dizziness -headache -heavy menstrual bleeding or vaginal bleeding -pain in the lower back or side -painful, blue or purple toes -painful skin ulcers that do not go away -signs and symptoms of bleeding such as bloody or black, tarry stools; red or dark-brown urine; spitting up blood or brown material that looks like coffee grounds; red spots on the skin; unusual bruising or bleeding from the eye, gums, or nose -stomach pain -unusually weak or tired Side effects that usually do not require medical attention (report to your doctor or health care professional if they continue or are bothersome): -diarrhea -hair loss This list may not describe all possible side effects. Call your doctor for medical advice about side effects. You may report side effects to FDA at 1-800-FDA-1088. Where should I keep my medicine? Keep out of the reach of children. Store at room temperature between 15 and 30 degrees C (59 and 86 degrees F). Protect from light. Throw away any unused medicine after the expiration date. Do not flush down the toilet. NOTE: This sheet is a summary. It may not cover all possible information. If you have questions about this medicine, talk to your doctor, pharmacist, or health care provider.  2018 Elsevier/Gold Standard (2016-07-18 11:27:41)

## 2016-11-12 NOTE — Progress Notes (Signed)
Advanced Heart Failure Rounding Note   Subjective:    Events: - PICC placed 11/02/16 and moved to SDU. Milrinone started empirically. Coox 38% 11/03/16. Milrinone increased. - Underwent TEE prior to planned DCCV 3/29.  Converted to SR while undergoing TEE. DCCV not performed.  - TEE 11/07/16 EF 20% RV severely HK Massive LA (7.7cm), Severely dilated RA, Normal AV, Severe central MR, Moderate TR (RVSP 65-70), Trivial PR.   Yesterday IV lasix stopped, milrinone cut back to 0.25 mcg, and hydralazine was increased. CVP 4. Weight down 40 pounds. Creatinine unchanged.     Denies SOB. Walked in the halls.   Objective:   Weight Range:  Vital Signs:   Temp:  [97.8 F (36.6 C)-98.7 F (37.1 C)] 97.8 F (36.6 C) (04/03 0808) Pulse Rate:  [94-108] 94 (04/03 0808) Resp:  [16-18] 18 (04/02 2100) BP: (110-132)/(62-93) 132/93 (04/03 0808) SpO2:  [93 %-96 %] 94 % (04/02 2100) Weight:  [481 lb 9.6 oz (218.5 kg)] 481 lb 9.6 oz (218.5 kg) (04/03 0452) Last BM Date: 11/11/16  Weight change: Filed Weights   11/10/16 0500 11/11/16 0314 11/12/16 0452  Weight: (!) 480 lb 11.2 oz (218 kg) (!) 481 lb (218.2 kg) (!) 481 lb 9.6 oz (218.5 kg)    Intake/Output:   Intake/Output Summary (Last 24 hours) at 11/12/16 0924 Last data filed at 11/12/16 0400  Gross per 24 hour  Intake           861.93 ml  Output             2700 ml  Net         -1838.07 ml     Physical Exam: CVP 4 General: Obese, NAD. In the chair.   HEENT: Normal anicteric Neck: Supple. JVP hard to assess due to body habitus. Carotids 2+ bilat; no bruits. No thyromegaly or nodule noted.    Cor: PMI non-palpable. RRR. Distant HS.   Lungs: Decreased in the bases.  Abdomen: Obese, NT, ND, + BS. No bruits or masses.  Extremities: No cyanosis, clubbing, or rash. R and LLE compression wraps.     Neuro: Alert & oriented x 3. Cranial nerves grossly intact. Moves all 4 extremities w/o difficulty. Affect pleasant    Telemetry: Sinus  Rhytm/Sinus Tach 90-100s. Personally reviewed.     Labs: Basic Metabolic Panel:  Recent Labs Lab 11/08/16 0404 11/09/16 0529 11/10/16 0500 11/11/16 0541 11/12/16 0515  NA 133* 129* 130* 130* 131*  K 4.4 4.7 4.6 3.9 4.0  CL 89* 85* 85* 86* 89*  CO2 31 33* 34* 32 30  GLUCOSE 137* 195* 113* 113* 111*  BUN 26* 25* 27* 28* 28*  CREATININE 1.65* 1.62* 1.75* 1.79* 1.71*  CALCIUM 9.0 9.4 9.8 9.3 9.5    Liver Function Tests: No results for input(s): AST, ALT, ALKPHOS, BILITOT, PROT, ALBUMIN in the last 168 hours. No results for input(s): LIPASE, AMYLASE in the last 168 hours. No results for input(s): AMMONIA in the last 168 hours.  CBC:  Recent Labs Lab 11/08/16 0404 11/09/16 0529 11/10/16 0500 11/11/16 0541 11/12/16 0515  WBC 10.8* 7.8 8.4 8.8 9.4  HGB 14.9 15.0 16.0 15.8 16.2  HCT 45.4 47.0 49.2 48.2 49.8  MCV 88.0 88.3 87.1 86.7 87.4  PLT 228 235 242 230 237    Cardiac Enzymes: No results for input(s): CKTOTAL, CKMB, CKMBINDEX, TROPONINI in the last 168 hours.  BNP: BNP (last 3 results)  Recent Labs  07/05/16 1549 10/31/16 1005  BNP 327.3*  831.9*    ProBNP (last 3 results) No results for input(s): PROBNP in the last 8760 hours.  Other results:  Imaging: No results found.   Medications:     Scheduled Medications: . atorvastatin  40 mg Oral Daily  . digoxin  0.25 mg Oral Daily  . hydrALAZINE  25 mg Oral Q8H  . isosorbide mononitrate  30 mg Oral Daily  . sodium chloride flush  10-40 mL Intracatheter Q12H  . sodium chloride flush  3 mL Intravenous Q12H  . sodium chloride flush  3 mL Intravenous Q12H  . spironolactone  25 mg Oral BID  . torsemide  40 mg Oral Daily  . Warfarin - Pharmacist Dosing Inpatient   Does not apply q1800    Infusions: . sodium chloride 250 mL (11/09/16 0400)  . amiodarone 30 mg/hr (11/11/16 2231)  . milrinone 0.25 mcg/kg/min (11/12/16 0334)    PRN Medications: sodium chloride, acetaminophen, sodium chloride flush,  sodium chloride flush, sodium chloride flush, traMADol  Assessment/Plan:   1. 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 Valves poorly visualized. Today CO-OX is 51%. Repeat at 1200. If CO-OX ok will cut back to 01.25 mcg.  Continue milrinone 0.25 mcg for now. Continue dig 0.25 mg daily. Dig level 0.3 on 4/1.  Increase hydralazine to 37.5 mg three times a day. Increase imdur to 60 mg daily.   No Ace/ARB. Off BB with low output.  - Tried entresto in the past but later stopped due to cough. Refuses rechallenge.   2. Paroxysmal A fib RVR  Chemically converted on IV amio.  Today he remains in NSR. Stop IV amio and start amio 400 mg po twice a day. INR 2.6.   3. Acute/CKD Stage III Creatinine unchanged 1.7.   4. OSA - Needs CPAP but its not covered by insurance. Case management working on helping with outpatient sleep study. No change to current plan. Has not had sleep study in 3 years. Will need to send for outpatient sleep study.  5. Super morbid obesity - needs to lose weight . As an outpatient will try to refer for bariatric surgery.  6. Noncompliance Refer to Paramedicine.  7. Hypokalemia - K 4.0. Stable.      Length of Stay: 12  Tonye Becket, NP  11/12/2016, 9:24 AM  Advanced Heart Failure Team Pager [phone] (M-F; 7a - 4p)  Please contact [facility] for night-coverage after hours (4p -7a ) and weekends on [email or web address]  Patient seen and examined with Tonye Becket, NP. We discussed all aspects of the encounter. I agree with the assessment and plan as stated above.   Remains on low-dose milrinone. Improved but tenuous with marginal co-ox. Will attempt to turn milrinone off today and see how he does. Continue po diuretics. Renal function had stabilized.Titrate hydral/nitrates as tolerated.  Maintaining NSR on amio. INR therapeutic (discussed with PharmD re dosing),.

## 2016-11-12 NOTE — Care Management (Signed)
Attending  Sleep study order form on shadow chart - please fill out so pt can be set up for study Raynald Blend, Charity fundraiser, BSN [phone]

## 2016-11-12 NOTE — Progress Notes (Signed)
ANTICOAGULATION CONSULT NOTE - Follow Up Consult  Pharmacy Consult for Coumadin Indication: atrial fibrillation  Allergies  Allergen Reactions  . Aspirin Hives, Itching and Rash   Patient Measurements: Height: 6\' 3"  (190.5 cm) Weight: (!) 481 lb 9.6 oz (218.5 kg) IBW/kg (Calculated) : 84.5  Vital Signs: Temp: 97.8 F (36.6 C) (04/03 0808) Temp Source: Oral (04/03 0808) BP: 132/93 (04/03 0808) Pulse Rate: 94 (04/03 0808)  Labs:  Recent Labs  11/10/16 0500 11/11/16 0541 11/12/16 0515  HGB 16.0 15.8 16.2  HCT 49.2 48.2 49.8  PLT 242 230 237  LABPROT 28.2* 28.3* 28.5*  INR 2.58 2.60 2.62  CREATININE 1.75* 1.79* 1.71*   Estimated Creatinine Clearance: 116.7 mL/min (A) (by C-G formula based on SCr of 1.71 mg/dL (H)).  Assessment: 35yom continues on coumadin for new afib. INR remains therapeutic. He continues on IV amiodarone. CBC stable. No bleeding.  INR today: 2.6  Goal of Therapy:  INR goal 2-3 Monitor platelets by anticoagulation protocol: Yes   Plan:  Continue Coumadin 7.5mg   Daily INR/CBC Monitor s/sx of bleeding  Sheppard Coil PharmD., BCPS Clinical Pharmacist Pager [phone] 11/12/2016 9:33 AM

## 2016-11-13 ENCOUNTER — Telehealth (HOSPITAL_COMMUNITY): Payer: Self-pay | Admitting: *Deleted

## 2016-11-13 LAB — COOXEMETRY PANEL
CARBOXYHEMOGLOBIN: 1.9 % — AB (ref 0.5–1.5)
Carboxyhemoglobin: 2.2 % — ABNORMAL HIGH (ref 0.5–1.5)
METHEMOGLOBIN: 0.6 % (ref 0.0–1.5)
Methemoglobin: 0.6 % (ref 0.0–1.5)
O2 SAT: 61 %
O2 Saturation: 75.8 %
Total hemoglobin: 15.9 g/dL (ref 12.0–16.0)
Total hemoglobin: 15.7 g/dL (ref 12.0–16.0)

## 2016-11-13 LAB — CBC
HCT: 47.4 % (ref 39.0–52.0)
HEMOGLOBIN: 15.4 g/dL (ref 13.0–17.0)
MCH: 28.4 pg (ref 26.0–34.0)
MCHC: 32.5 g/dL (ref 30.0–36.0)
MCV: 87.5 fL (ref 78.0–100.0)
Platelets: 223 10*3/uL (ref 150–400)
RBC: 5.42 MIL/uL (ref 4.22–5.81)
RDW: 13.2 % (ref 11.5–15.5)
WBC: 7.6 10*3/uL (ref 4.0–10.5)

## 2016-11-13 LAB — BASIC METABOLIC PANEL
ANION GAP: 12 (ref 5–15)
BUN: 25 mg/dL — ABNORMAL HIGH (ref 6–20)
CHLORIDE: 90 mmol/L — AB (ref 101–111)
CO2: 31 mmol/L (ref 22–32)
Calcium: 9.3 mg/dL (ref 8.9–10.3)
Creatinine, Ser: 1.83 mg/dL — ABNORMAL HIGH (ref 0.61–1.24)
GFR calc Af Amer: 53 mL/min — ABNORMAL LOW (ref >60)
GFR, EST NON AFRICAN AMERICAN: 46 mL/min — AB (ref >60)
GLUCOSE: 95 mg/dL (ref 65–99)
POTASSIUM: 3.8 mmol/L (ref 3.5–5.1)
Sodium: 133 mmol/L — ABNORMAL LOW (ref 135–145)

## 2016-11-13 LAB — PROTIME-INR
INR: 2.82
PROTHROMBIN TIME: 30.3 s — AB (ref 11.4–15.2)

## 2016-11-13 MED ORDER — WARFARIN SODIUM 7.5 MG PO TABS
7.5000 mg | ORAL_TABLET | Freq: Once | ORAL | Status: AC
  Administered 2016-11-13: 7.5 mg via ORAL
  Filled 2016-11-13: qty 1

## 2016-11-13 MED ORDER — HYDRALAZINE HCL 50 MG PO TABS
50.0000 mg | ORAL_TABLET | Freq: Three times a day (TID) | ORAL | Status: DC
  Administered 2016-11-13 (×2): 50 mg via ORAL
  Filled 2016-11-13 (×2): qty 1

## 2016-11-13 NOTE — Telephone Encounter (Signed)
Patient left VM on triage line asking about the status on his disability paperwork.  I called patient back and told him that the his paperwork was faxed yesterday afternoon.  No further questions at this time.

## 2016-11-13 NOTE — Progress Notes (Signed)
Advanced Heart Failure Rounding Note   Subjective:    Events: - PICC placed 11/02/16 and moved to SDU. Milrinone started empirically. Coox 38% 11/03/16. Milrinone increased. - Underwent TEE prior to planned DCCV 3/29.  Converted to SR while undergoing TEE. DCCV not performed.  - TEE 11/07/16 EF 20% RV severely HK Massive LA (7.7cm), Severely dilated RA, Normal AV, Severe central MR, Moderate TR (RVSP 65-70), Trivial PR.   Yesterday milrinone was cut back to 0.125 mcg. Today CO-OX is 76%.   Overall feeling good. Denies SOB/orthopnea. Walking in the halls.   Objective:   Weight Range:  Vital Signs:   Temp:  [97.8 F (36.6 C)-98.8 F (37.1 C)] 98 F (36.7 C) (04/04 0810) Pulse Rate:  [89-102] 89 (04/04 0810) Resp:  [16-24] 16 (04/04 0400) BP: (118-136)/(78-93) 136/92 (04/04 0810) SpO2:  [73 %-98 %] 98 % (04/04 0400) Weight:  [479 lb 3.2 oz (217.4 kg)] 479 lb 3.2 oz (217.4 kg) (04/04 0546) Last BM Date: 11/11/16  Weight change: Filed Weights   11/11/16 0314 11/12/16 0452 11/13/16 0546  Weight: (!) 481 lb (218.2 kg) (!) 481 lb 9.6 oz (218.5 kg) (!) 479 lb 3.2 oz (217.4 kg)    Intake/Output:   Intake/Output Summary (Last 24 hours) at 11/13/16 0854 Last data filed at 11/13/16 0523  Gross per 24 hour  Intake           617.45 ml  Output             2650 ml  Net         -2032.55 ml     Physical Exam: CVP 5 General:  Well appearing. No resp difficulty. In the chair.  HEENT: normal Neck: supple. no JVD. Carotids 2+ bilat; no bruits. No lymphadenopathy or thryomegaly appreciated. Cor: PMI nondisplaced. Regular rate & rhythm. No rubs, gallops or murmurs. Lungs: clear Abdomen: obese, soft, nontender, nondistended. No hepatosplenomegaly. No bruits or masses. Good bowel sounds. Extremities: no cyanosis, clubbing, rash, edema Neuro: alert & orientedx3, cranial nerves grossly intact. moves all 4 extremities w/o difficulty. Affect pleasant  Telemetry: Sinus Rhythm 100s.  Personally reviewed.      Labs: Basic Metabolic Panel:  Recent Labs Lab 11/09/16 0529 11/10/16 0500 11/11/16 0541 11/12/16 0515 11/13/16 0248  NA 129* 130* 130* 131* 133*  K 4.7 4.6 3.9 4.0 3.8  CL 85* 85* 86* 89* 90*  CO2 33* 34* 32 30 31  GLUCOSE 195* 113* 113* 111* 95  BUN 25* 27* 28* 28* 25*  CREATININE 1.62* 1.75* 1.79* 1.71* 1.83*  CALCIUM 9.4 9.8 9.3 9.5 9.3    Liver Function Tests: No results for input(s): AST, ALT, ALKPHOS, BILITOT, PROT, ALBUMIN in the last 168 hours. No results for input(s): LIPASE, AMYLASE in the last 168 hours. No results for input(s): AMMONIA in the last 168 hours.  CBC:  Recent Labs Lab 11/09/16 0529 11/10/16 0500 11/11/16 0541 11/12/16 0515 11/13/16 0248  WBC 7.8 8.4 8.8 9.4 7.6  HGB 15.0 16.0 15.8 16.2 15.4  HCT 47.0 49.2 48.2 49.8 47.4  MCV 88.3 87.1 86.7 87.4 87.5  PLT 235 242 230 237 223    Cardiac Enzymes: No results for input(s): CKTOTAL, CKMB, CKMBINDEX, TROPONINI in the last 168 hours.  BNP: BNP (last 3 results)  Recent Labs  07/05/16 1549 10/31/16 1005  BNP 327.3* 831.9*    ProBNP (last 3 results) No results for input(s): PROBNP in the last 8760 hours.  Other results:  Imaging: No results found.  Medications:     Scheduled Medications: . amiodarone  400 mg Oral BID  . atorvastatin  40 mg Oral Daily  . digoxin  0.25 mg Oral Daily  . hydrALAZINE  37.5 mg Oral Q8H  . hydrocortisone cream  1 application Topical QID  . isosorbide mononitrate  60 mg Oral Daily  . sodium chloride flush  10-40 mL Intracatheter Q12H  . sodium chloride flush  3 mL Intravenous Q12H  . sodium chloride flush  3 mL Intravenous Q12H  . spironolactone  25 mg Oral BID  . torsemide  40 mg Oral Daily  . Warfarin - Pharmacist Dosing Inpatient   Does not apply q1800    Infusions: . sodium chloride 250 mL (11/09/16 0400)  . milrinone 0.125 mcg/kg/min (11/13/16 0523)    PRN Medications: sodium chloride, acetaminophen, sodium  chloride flush, sodium chloride flush, sodium chloride flush, traMADol  Assessment/Plan:   1. 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 Valves poorly visualized. CO-OX 76%. Stop milrinone.  Volume status stable. Continue torsemide 40 mg daily.  Increase hydralazine to 50 mg tid and continue imdur 60 mg daily.  No Arb with with elevated creatinine. Off BB with low output.  - Tried entresto in the past but later stopped due to cough. Refuses rechallenge.   2. Paroxysmal A fib RVR Maintaining NSR.  Continue po amio 400 mg twice a day. INR 2.82.  3. Acute/CKD Stage III Renal function trending up 1.7>1.8    4. OSA - Needs CPAP but its not covered by insurance. Case management working on helping with outpatient sleep study. No change to current plan. Has not had sleep study in 3 years. Will need to send for outpatient sleep study. Will complete form.  5. Super morbid obesity - needs to lose weight . As an outpatient will try to refer for bariatric surgery.  6. Noncompliance Referred to Paramedicine.   7. Hypokalemia - K 3.8       Length of Stay: 13  Tonye Becket, NP  11/13/2016, 8:54 AM  Advanced Heart Failure Team  Patient seen and examined with Tonye Becket, NP. We discussed all aspects of the encounter. I agree with the assessment and plan as stated above.   Improved today. Co-ox stable. Will stop milrinone. Discussed with PharmD who agrees.   Volume status stable. Continue po diuretics.   Creatinine up slightly. Will continue to follow.   Maintain NSR on amio. INR ok. Continue current regimen.   Possibly home in am.  [name], MD  8:20 PM   Pager [phone] (M-F; 7a - 4p)  Please contact [facility] for night-coverage after hours (4p -7a ) and weekends on [email or web address]

## 2016-11-13 NOTE — Progress Notes (Signed)
ANTICOAGULATION CONSULT NOTE - Follow Up Consult  Pharmacy Consult for Coumadin Indication: atrial fibrillation  Allergies  Allergen Reactions  . Aspirin Hives, Itching and Rash   Patient Measurements: Height: 6\' 3"  (190.5 cm) Weight: (!) 479 lb 3.2 oz (217.4 kg) IBW/kg (Calculated) : 84.5  Vital Signs: Temp: 98 F (36.7 C) (04/04 0810) Temp Source: Oral (04/04 0810) BP: 136/92 (04/04 0810) Pulse Rate: 89 (04/04 0810)  Labs:  Recent Labs  11/11/16 0541 11/12/16 0515 11/13/16 0248  HGB 15.8 16.2 15.4  HCT 48.2 49.8 47.4  PLT 230 237 223  LABPROT 28.3* 28.5* 30.3*  INR 2.60 2.62 2.82  CREATININE 1.79* 1.71* 1.83*   Estimated Creatinine Clearance: 108.7 mL/min (A) (by C-G formula based on SCr of 1.83 mg/dL (H)).  Assessment: 35yom continues on coumadin for new afib. INR remains therapeutic. Amiodarone switched to po. CBC stable. No bleeding.  INR today: 2.8  Goal of Therapy:  INR goal 2-3 Monitor platelets by anticoagulation protocol: Yes   Plan:  Continue Coumadin 7.5mg   Daily INR/CBC Monitor s/sx of bleeding  Sheppard Coil PharmD., BCPS Clinical Pharmacist Pager [phone] 11/13/2016 9:50 AM

## 2016-11-13 NOTE — Progress Notes (Signed)
CARDIAC REHAB PHASE I   PRE:  Rate/Rhythm: 101 ST  BP:  Sitting: 124/78        SaO2: 95 RA  MODE:  Ambulation: 1100 ft   POST:  Rate/Rhythm: 124 ST  BP:  Sitting: 133/97         SaO2: 94 RA  Pt ambulated 1100 ft on RA, independent, steady gait, tolerated well with no complaints. Pt with some noted DOE, however, pt states his breathing is "much better." Pt states he has no questions regarding education at this time. Pt to recliner after walk, call bell within reach. Will follow.   [phone] Joylene Grapes, RN, BSN 11/13/2016 11:22 AM

## 2016-11-14 LAB — BASIC METABOLIC PANEL
Anion gap: 9 (ref 5–15)
BUN: 26 mg/dL — AB (ref 6–20)
CO2: 30 mmol/L (ref 22–32)
Calcium: 9.3 mg/dL (ref 8.9–10.3)
Chloride: 93 mmol/L — ABNORMAL LOW (ref 101–111)
Creatinine, Ser: 1.73 mg/dL — ABNORMAL HIGH (ref 0.61–1.24)
GFR calc Af Amer: 57 mL/min — ABNORMAL LOW (ref >60)
GFR, EST NON AFRICAN AMERICAN: 49 mL/min — AB (ref >60)
GLUCOSE: 94 mg/dL (ref 65–99)
POTASSIUM: 4.3 mmol/L (ref 3.5–5.1)
Sodium: 132 mmol/L — ABNORMAL LOW (ref 135–145)

## 2016-11-14 LAB — CBC
HCT: 50.1 % (ref 39.0–52.0)
Hemoglobin: 15.7 g/dL (ref 13.0–17.0)
MCH: 28.8 pg (ref 26.0–34.0)
MCHC: 31.3 g/dL (ref 30.0–36.0)
MCV: 91.9 fL (ref 78.0–100.0)
PLATELETS: 190 10*3/uL (ref 150–400)
RBC: 5.45 MIL/uL (ref 4.22–5.81)
RDW: 17.3 % — AB (ref 11.5–15.5)
WBC: 8.1 10*3/uL (ref 4.0–10.5)

## 2016-11-14 LAB — COOXEMETRY PANEL
Carboxyhemoglobin: 1.4 % (ref 0.5–1.5)
Methemoglobin: 0.8 % (ref 0.0–1.5)
O2 Saturation: 57.4 %
Total hemoglobin: 15.8 g/dL (ref 12.0–16.0)

## 2016-11-14 LAB — PROTIME-INR
INR: 2.8
Prothrombin Time: 30.1 seconds — ABNORMAL HIGH (ref 11.4–15.2)

## 2016-11-14 MED ORDER — AMIODARONE HCL 200 MG PO TABS
200.0000 mg | ORAL_TABLET | Freq: Two times a day (BID) | ORAL | Status: DC
  Administered 2016-11-14: 200 mg via ORAL
  Filled 2016-11-14: qty 1

## 2016-11-14 MED ORDER — HYDRALAZINE HCL 50 MG PO TABS: 50.0000 mg | ORAL_TABLET | Freq: Three times a day (TID) | ORAL | 6 refills | Status: DC

## 2016-11-14 MED ORDER — DIGOXIN 250 MCG PO TABS: 0.2500 mg | ORAL_TABLET | Freq: Every day | ORAL | 6 refills | Status: DC

## 2016-11-14 MED ORDER — AMIODARONE HCL 200 MG PO TABS: 200.0000 mg | ORAL_TABLET | Freq: Two times a day (BID) | ORAL | 3 refills | Status: DC

## 2016-11-14 MED ORDER — ISOSORBIDE MONONITRATE ER 60 MG PO TB24: 60.0000 mg | ORAL_TABLET | Freq: Every day | ORAL | 6 refills | Status: DC

## 2016-11-14 MED ORDER — WARFARIN SODIUM 7.5 MG PO TABS
7.5000 mg | ORAL_TABLET | Freq: Every day | ORAL | 6 refills | Status: DC
Start: 2016-11-14 — End: 2017-02-11

## 2016-11-14 MED ORDER — WARFARIN SODIUM 7.5 MG PO TABS: 7.5000 mg | ORAL_TABLET | Freq: Every day | ORAL | Status: DC

## 2016-11-14 MED ORDER — TORSEMIDE 20 MG PO TABS: 40.0000 mg | ORAL_TABLET | Freq: Every day | ORAL | 6 refills | Status: DC

## 2016-11-14 NOTE — Progress Notes (Signed)
Advanced Heart Failure Rounding Note   Subjective:    Events: - PICC placed 11/02/16 and moved to SDU. Milrinone started empirically. Coox 38% 11/03/16. Milrinone increased. - Underwent TEE prior to planned DCCV 3/29.  Converted to SR while undergoing TEE. DCCV not performed.  - TEE 11/07/16 EF 20% RV severely HK Massive LA (7.7cm), Severely dilated RA, Normal AV, Severe central MR, Moderate TR (RVSP 65-70), Trivial PR.   Yesterday milrinone was stopped. Todays CO-OX is 57%. Maintaining NSR.   Wants to go home. Denies SOB/Orthopnea.    Objective:   Weight Range:  Vital Signs:   Temp:  [97.6 F (36.4 C)-99.3 F (37.4 C)] 97.6 F (36.4 C) (04/05 0800) Pulse Rate:  [80-125] 125 (04/05 0800) Resp:  [18-24] 18 (04/05 0800) BP: (107-128)/(74-112) 128/112 (04/05 0800) SpO2:  [92 %-100 %] 100 % (04/05 0800) Weight:  [477 lb 9.6 oz (216.6 kg)] 477 lb 9.6 oz (216.6 kg) (04/05 0335) Last BM Date: 11/12/16  Weight change: Filed Weights   11/12/16 0452 11/13/16 0546 11/14/16 0335  Weight: (!) 481 lb 9.6 oz (218.5 kg) (!) 479 lb 3.2 oz (217.4 kg) (!) 477 lb 9.6 oz (216.6 kg)    Intake/Output:   Intake/Output Summary (Last 24 hours) at 11/14/16 1000 Last data filed at 11/13/16 2157  Gross per 24 hour  Intake              240 ml  Output              750 ml  Net             -510 ml     Physical Exam: General:  Well appearing. No resp difficulty. Walking in the room.  HEENT: normal Neck: supple. JVP 5-6. Carotids 2+ bilat; no bruits. No lymphadenopathy or thryomegaly appreciated. Cor: PMI nondisplaced. Regular rate & rhythm. No rubs, gallops or murmurs. Lungs: clear Abdomen: obese,  soft, nontender, nondistended. No hepatosplenomegaly. No bruits or masses. Good bowel sounds. Extremities: no cyanosis, clubbing, rash, R and LLE trace edema Neuro: alert & orientedx3, cranial nerves grossly intact. moves all 4 extremities w/o difficulty. Affect pleasant  Telemetry: NSR 90-100s.  Personally reviewed.      Labs: Basic Metabolic Panel:  Recent Labs Lab 11/10/16 0500 11/11/16 0541 11/12/16 0515 11/13/16 0248 11/14/16 0554  NA 130* 130* 131* 133* 132*  K 4.6 3.9 4.0 3.8 4.3  CL 85* 86* 89* 90* 93*  CO2 34* 32 30 31 30   GLUCOSE 113* 113* 111* 95 94  BUN 27* 28* 28* 25* 26*  CREATININE 1.75* 1.79* 1.71* 1.83* 1.73*  CALCIUM 9.8 9.3 9.5 9.3 9.3    Liver Function Tests: No results for input(s): AST, ALT, ALKPHOS, BILITOT, PROT, ALBUMIN in the last 168 hours. No results for input(s): LIPASE, AMYLASE in the last 168 hours. No results for input(s): AMMONIA in the last 168 hours.  CBC:  Recent Labs Lab 11/10/16 0500 11/11/16 0541 11/12/16 0515 11/13/16 0248 11/14/16 0439  WBC 8.4 8.8 9.4 7.6 8.1  HGB 16.0 15.8 16.2 15.4 15.7  HCT 49.2 48.2 49.8 47.4 50.1  MCV 87.1 86.7 87.4 87.5 91.9  PLT 242 230 237 223 190    Cardiac Enzymes: No results for input(s): CKTOTAL, CKMB, CKMBINDEX, TROPONINI in the last 168 hours.  BNP: BNP (last 3 results)  Recent Labs  07/05/16 1549 10/31/16 1005  BNP 327.3* 831.9*    ProBNP (last 3 results) No results for input(s): PROBNP in the last 8760  hours.  Other results:  Imaging: No results found.   Medications:     Scheduled Medications: . amiodarone  400 mg Oral BID  . atorvastatin  40 mg Oral Daily  . digoxin  0.25 mg Oral Daily  . hydrALAZINE  50 mg Oral Q8H  . hydrocortisone cream  1 application Topical QID  . isosorbide mononitrate  60 mg Oral Daily  . sodium chloride flush  10-40 mL Intracatheter Q12H  . sodium chloride flush  3 mL Intravenous Q12H  . sodium chloride flush  3 mL Intravenous Q12H  . spironolactone  25 mg Oral BID  . torsemide  40 mg Oral Daily  . warfarin  7.5 mg Oral q1800  . Warfarin - Pharmacist Dosing Inpatient   Does not apply q1800    Infusions: . sodium chloride 250 mL (11/09/16 0400)    PRN Medications: sodium chloride, acetaminophen, sodium chloride flush,  sodium chloride flush, sodium chloride flush, traMADol  Assessment/Plan:   1. 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 Valves poorly visualized. Stable off milrinone. Volume status stable. Continue torsemide 40 mg daily.  Continue hydralazine 50 mg three times a day + imdur 60 mg daily.  No Arb with with elevated creatinine. Off BB with low output.  - Tried entresto in the past but later stopped due to cough. Refuses rechallenge.   2. Paroxysmal A fib RVR In NSR. Cut amio 200 mg twice a day.   Continue coumadin. Set [facility] with [facility]. Next Tuesday at 10:30. INR 2.82.  3. Acute/CKD Stage III Creatinine 1.7. Stable.    4. OSA - Needs CPAP but its not covered by insurance. Case management working on helping with outpatient sleep study. No change to current plan. Has not had sleep study in 3 years.  Completed form for outpatient sleep study.   5. Super morbid obesity - needs to lose weight . As an outpatient will try to refer for bariatric surgery.  6. Noncompliance Referred to Paramedicine.   7. Hypokalemia - Stable. K 4.3   Home today. Follow up in HF clinic next week and set up with [facility] next week.    Length of Stay: 14  Amy Clegg, NP  11/14/2016, 10:00 AM  Advanced Heart Failure Team  Patient seen and examined with Tonye Becket, NP. We discussed all aspects of the encounter. I agree with the assessment and plan as stated above.   Volume status much improved. Co-ox stable. Maintaining NSR on amio.   Will d/c home today. Will need close f/u in HF Clinic.   [name], MD  2:36 PM

## 2016-11-14 NOTE — Care Management Note (Addendum)
Case Management Note  Patient Details  Name: Mark Baker MRN: [id number] Date of Birth: [birth year]  Subjective/Objective:        Pt admitted with HF            Action/Plan:   PTA pt completely independent from home with wife.  Pt is more than 500lbs but completely self sufficient with ADLs and mobility.  Pt will need CPAP at discharge - last sleep study completed in 2014.  CM provided sleep study order on shadow chart and requested attending to complete.  CM also contacted [facility] for referral for DME - pt has agreed to pay the $125 per month to be able to take equipment home on day of discharge.  Per [facility] this will be the required copay per month until sleep study is complete.  CM will continue to follow for discharge needs   Expected Discharge Date:  11/14/16               Expected Discharge Plan:  Home/Self Care  In-House Referral:     Discharge planning Services  CM Consult  Post Acute Care Choice:    Choice offered to:     DME Arranged:    DME Agency:     HH Arranged:    HH Agency:     Status of Service:  In process, will continue to follow  If discussed at Long Length of Stay Meetings, dates discussed:    Additional Comments: 11/14/2016  CM verified that sleep study order was received - they will work with Rosann Auerbach and contact the pt directly regarding the appt.  Pt discharging home today.  CM faxed over completed sleep study order form to sleep study center - appt not yet obtained.  Pt will have BIPAP delivered to home tonight as he is willing to private pay until sleep study is completed - [facility] arranging payment options for pt.   Cherylann Parr, RN 11/14/2016, 10:52 AM

## 2016-11-14 NOTE — Progress Notes (Signed)
Visit made to pts room to place pt on Cpap.  Pt sitting up in chair and states he self administers and will tonight as well.

## 2016-11-14 NOTE — Discharge Summary (Signed)
Advanced Heart Failure Team  Discharge Summary   Patient ID: Mark Baker MRN: [id number], DOB/AGE: 1981/05/28 36 y.o. Admit date: 10/31/2016 D/C date:     11/14/2016   Primary Discharge Diagnoses:  1. A/C Diastolic/Systolic Heart Failure 2. PAF 3. A/C CKD Stage III 4. OSA 5. Morbid Obesity 6. Hypokalemia  Hospital Course:  Mark Baker is 36 year old with a history of combined systolic/diastolic heart failure, NICM, OSA, obesity, and noncompliance admitted with new onset A fib RVR.   Admitted with A Fib RVR and volume overload. Started on lopressor, heparin, and IV lasix + metolazone. Lopressor later stopped due abdominal discomfort. Of note he was on Toprol XL at home. CXR with vascular congestion noted. Sluggish urine output noted. Pertinent admission labs include: BNP 831, Hgb 15.5, Sodium 135, and creatinine 2.0.2.  Diuresed with IV lasix but due to sluggish response he had PICC placed with evidence of low output heart failure so milrinone was started.   Hospital course was complicated by A fib RVR. Amiodarone and coumadin started and later underwent TEE. He spontaneously converted to NSR therefore he did not require DC-CV.  Overall diuresed 44 pounds and was stable to torsemide.  Milrinone was was gradually weaned off as improved with stable mixed venous saturation. Weight on d/c 477 pounds.   He will continue to be followed closely in the HF clinic. He was set up at the [facility] Clinic to manage INR. CPAP was ordered for discharge. HF paramedicine will follow in the community.   1. 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 ECHO 06/2016 EF~25% - Echo 11/03/16 LVEF 20-25%. Valves poorly visualized. Placed on short term milrinone. Diuresed with IV lasix.  Transitioned to torsemide 40 mg daily. Continue hydralazine 50 mg three times a day + imdur 60 mg daily. No Arb with with elevated creatinine. Off BB with low output.  - Tried entresto in the  past but later stopped due to cough. Refuses rechallenge.   2. Paroxysmal A fib RVR Placed on Amio drip and converted to NSR. Continue amio 200 mg twice a day.   Continue coumadin. Set up with Coumadin Clinic. He has follow up next Tuesday at 10:30. INR 2.82.  3. Acute/CKD Stage III-Creatinine peaked at 2.07 but was down to 1.7. Repeat BMET next week.     4. OSA- CM set up CPAP for discharge.  5. Morbid Obesity- Body mass index is 59.7 kg/m. 6. Noncompliance Referred to Paramedicine.   7. Hypokalemia - Stable. K 4  Events: - PICC placed 11/02/16 and moved to SDU. Milrinone started empirically. Coox 38% 11/03/16. Milrinone increased. - Underwent TEE prior to planned DCCV 3/29.  Converted to SR while undergoing TEE. DCCV not performed.  - TEE 11/07/16 EF 20% RV severely HK Massive LA (7.7cm), Severely dilated RA, Normal AV, Severe central Mark, Moderate TR (RVSP 65-70), Trivial PR.    Discharge Weight: 477 pounds Discharge Vitals: Blood pressure (!) 128/112, pulse (!) 125, temperature 97.6 F (36.4 C), temperature source Oral, resp. rate 18, height 6\' 3"  (1.905 m), weight (!) 477 lb 9.6 oz (216.6 kg), SpO2 100 %.  Labs: Lab Results  Component Value Date   WBC 8.1 11/14/2016   HGB 15.7 11/14/2016   HCT 50.1 11/14/2016   MCV 91.9 11/14/2016   PLT 190 11/14/2016     Recent Labs Lab 11/14/16 0554  NA 132*  K 4.3  CL 93*  CO2 30  BUN 26*  CREATININE  1.73*  CALCIUM 9.3  GLUCOSE 94   Lab Results  Component Value Date   CHOL 185 07/06/2016   HDL 30 (L) 07/06/2016   LDLCALC 109 (H) 07/06/2016   TRIG 229 (H) 07/06/2016   BNP (last 3 results)  Recent Labs  07/05/16 1549 10/31/16 1005  BNP 327.3* 831.9*    ProBNP (last 3 results) No results for input(s): PROBNP in the last 8760 hours.   Diagnostic Studies/Procedures   No results found.  Discharge Medications   Allergies as of 11/14/2016      Reactions   Aspirin Hives, Itching, Rash      Medication List     STOP taking these medications   furosemide 80 MG tablet Commonly known as:  LASIX   ibuprofen 200 MG tablet Commonly known as:  ADVIL,MOTRIN   losartan 50 MG tablet Commonly known as:  COZAAR   metoprolol succinate 50 MG 24 hr tablet Commonly known as:  TOPROL-XL     TAKE these medications   amiodarone 200 MG tablet Commonly known as:  PACERONE Take 1 tablet (200 mg total) by mouth 2 (two) times daily.   atorvastatin 40 MG tablet Commonly known as:  LIPITOR Take 1 tablet (40 mg total) by mouth daily.   digoxin 0.25 MG tablet Commonly known as:  LANOXIN Take 1 tablet (0.25 mg total) by mouth daily.   hydrALAZINE 50 MG tablet Commonly known as:  APRESOLINE Take 1 tablet (50 mg total) by mouth every 8 (eight) hours.   isosorbide mononitrate 60 MG 24 hr tablet Commonly known as:  IMDUR Take 1 tablet (60 mg total) by mouth daily.   spironolactone 25 MG tablet Commonly known as:  ALDACTONE Take 1 tablet (25 mg total) by mouth daily.   torsemide 20 MG tablet Commonly known as:  DEMADEX Take 2 tablets (40 mg total) by mouth daily.   warfarin 7.5 MG tablet Commonly known as:  COUMADIN Take 1 tablet (7.5 mg total) by mouth daily at 6 PM.       Disposition   The patient will be discharged in stable condition to home. Discharge Instructions    (HEART FAILURE PATIENTS) Call MD:  Anytime you have any of the following symptoms: 1) 3 pound weight gain in 24 hours or 5 pounds in 1 week 2) shortness of breath, with or without a dry hacking cough 3) swelling in the hands, feet or stomach 4) if you have to sleep on extra pillows at night in order to breathe.    Complete by:  As directed    Amb Referral to Cardiac Rehabilitation    Complete by:  As directed    Diagnosis:  Heart Failure (see criteria below if ordering Phase II)   Heart Failure Type:  Chronic Systolic & Diastolic   Diet - low sodium heart healthy    Complete by:  As directed    Heart Failure patients record  your daily weight using the same scale at the same time of day    Complete by:  As directed    Increase activity slowly    Complete by:  As directed      Follow-up Information    Tonye Becket, NP Follow up on 11/13/2016.   Specialty:  Cardiology Why:  11:00. The Heart Failure Clinic is located on the 1st floor of [address]  Contact information: [address] [place] [address] [phone]        [facility] Follow up  on 11/19/2016.   Specialty:  Cardiology Why:  at 10:30  Contact information: [address] [place] [address] [phone]            Duration of Discharge Encounter: Greater than 35 minutes   Signed, Tonye Becket NP-C  11/14/2016, 10:36 AM   Patient seen and examined with Tonye Becket, NP. We discussed all aspects of the encounter. I agree with the assessment and plan as stated above.   He is much improved. Agree with d/c today. Will need to follow closely in HF Clinic to watch for repeat decompensation.   [name], MD  10:48 PM

## 2016-11-14 NOTE — Progress Notes (Signed)
ANTICOAGULATION CONSULT NOTE - Follow Up Consult  Pharmacy Consult for Coumadin Indication: atrial fibrillation  Allergies  Allergen Reactions  . Aspirin Hives, Itching and Rash   Patient Measurements: Height: 6\' 3"  (190.5 cm) Weight: (!) 477 lb 9.6 oz (216.6 kg) IBW/kg (Calculated) : 84.5  Vital Signs: Temp: 97.6 F (36.4 C) (04/05 0800) Temp Source: Oral (04/05 0800) BP: 128/112 (04/05 0800) Pulse Rate: 125 (04/05 0800)  Labs:  Recent Labs  11/12/16 0515 11/13/16 0248 11/14/16 0439 11/14/16 0554 11/14/16 0627  HGB 16.2 15.4 15.7  --   --   HCT 49.8 47.4 50.1  --   --   PLT 237 223 190  --   --   LABPROT 28.5* 30.3*  --   --  30.1*  INR 2.62 2.82  --   --  2.80  CREATININE 1.71* 1.83*  --  1.73*  --    Estimated Creatinine Clearance: 114.6 mL/min (A) (by C-G formula based on SCr of 1.73 mg/dL (H)).  Assessment: 35yom continues on coumadin for new afib.  INR remains therapeutic. Amiodarone continues, now po. CBC stable. No bleeding issues noted.  INR today: 2.8 (stable)  Goal of Therapy:  INR goal 2-3 Monitor platelets by anticoagulation protocol: Yes   Plan:  Continue Coumadin 7.5mg  >>would recommend this at discharge Daily INR/CBC Monitor s/sx of bleeding  Sheppard Coil PharmD., BCPS Clinical Pharmacist Pager [phone] 11/14/2016 8:45 AM

## 2016-11-18 ENCOUNTER — Telehealth (HOSPITAL_COMMUNITY): Payer: Self-pay | Admitting: General Practice

## 2016-11-18 ENCOUNTER — Other Ambulatory Visit (HOSPITAL_BASED_OUTPATIENT_CLINIC_OR_DEPARTMENT_OTHER): Payer: Self-pay

## 2016-11-18 DIAGNOSIS — R0683 Snoring: Secondary | ICD-10-CM

## 2016-11-18 DIAGNOSIS — R5383 Other fatigue: Secondary | ICD-10-CM

## 2016-11-18 NOTE — Telephone Encounter (Signed)
Patient insurance is active and benefits verified. Patient has [employer] - no co-payment, deductible $1500/$1500 has been met, out of pocket $3000/$1897.66 has been met, 10% co-insurance, limit is 36 visits and no pre-authorization. Passport/reference # [id number]. Information given to [facility] for review.

## 2016-11-19 ENCOUNTER — Ambulatory Visit (INDEPENDENT_AMBULATORY_CARE_PROVIDER_SITE_OTHER): Payer: Managed Care, Other (non HMO)

## 2016-11-19 DIAGNOSIS — Z5181 Encounter for therapeutic drug level monitoring: Secondary | ICD-10-CM | POA: Diagnosis not present

## 2016-11-19 DIAGNOSIS — I4891 Unspecified atrial fibrillation: Secondary | ICD-10-CM

## 2016-11-19 LAB — POCT INR: INR: 5.7

## 2016-11-19 NOTE — Patient Instructions (Signed)

## 2016-11-20 ENCOUNTER — Encounter (HOSPITAL_COMMUNITY): Payer: Self-pay

## 2016-11-20 ENCOUNTER — Ambulatory Visit (HOSPITAL_COMMUNITY)
Admission: RE | Admit: 2016-11-20 | Discharge: 2016-11-20 | Disposition: A | Discharge status: Home / Self Care | Payer: Managed Care, Other (non HMO) | Source: Ambulatory Visit | Attending: Cardiology | Admitting: Cardiology

## 2016-11-20 ENCOUNTER — Other Ambulatory Visit (HOSPITAL_COMMUNITY): Payer: Self-pay

## 2016-11-20 VITALS — BP 122/90 | HR 103 | Wt >= 6400 oz

## 2016-11-20 DIAGNOSIS — Z6841 Body Mass Index (BMI) 40.0 and over, adult: Secondary | ICD-10-CM | POA: Insufficient documentation

## 2016-11-20 DIAGNOSIS — G4733 Obstructive sleep apnea (adult) (pediatric): Secondary | ICD-10-CM | POA: Diagnosis not present

## 2016-11-20 DIAGNOSIS — I130 Hypertensive heart and chronic kidney disease with heart failure and stage 1 through stage 4 chronic kidney disease, or unspecified chronic kidney disease: Secondary | ICD-10-CM | POA: Diagnosis not present

## 2016-11-20 DIAGNOSIS — Z79899 Other long term (current) drug therapy: Secondary | ICD-10-CM | POA: Insufficient documentation

## 2016-11-20 DIAGNOSIS — Z7901 Long term (current) use of anticoagulants: Secondary | ICD-10-CM | POA: Insufficient documentation

## 2016-11-20 DIAGNOSIS — I5022 Chronic systolic (congestive) heart failure: Secondary | ICD-10-CM | POA: Diagnosis not present

## 2016-11-20 DIAGNOSIS — I5042 Chronic combined systolic (congestive) and diastolic (congestive) heart failure: Secondary | ICD-10-CM | POA: Diagnosis not present

## 2016-11-20 DIAGNOSIS — I480 Paroxysmal atrial fibrillation: Secondary | ICD-10-CM | POA: Diagnosis not present

## 2016-11-20 DIAGNOSIS — I429 Cardiomyopathy, unspecified: Secondary | ICD-10-CM | POA: Insufficient documentation

## 2016-11-20 DIAGNOSIS — N183 Chronic kidney disease, stage 3 unspecified: Secondary | ICD-10-CM

## 2016-11-20 DIAGNOSIS — E669 Obesity, unspecified: Secondary | ICD-10-CM | POA: Insufficient documentation

## 2016-11-20 LAB — BASIC METABOLIC PANEL WITH GFR
Anion gap: 11 (ref 5–15)
BUN: 21 mg/dL — ABNORMAL HIGH (ref 6–20)
CO2: 30 mmol/L (ref 22–32)
Calcium: 9.1 mg/dL (ref 8.9–10.3)
Chloride: 98 mmol/L — ABNORMAL LOW (ref 101–111)
Creatinine, Ser: 1.59 mg/dL — ABNORMAL HIGH (ref 0.61–1.24)
GFR calc Af Amer: >60 mL/min
GFR calc non Af Amer: 54 mL/min — ABNORMAL LOW
Glucose, Bld: 92 mg/dL (ref 65–99)
Potassium: 4 mmol/L (ref 3.5–5.1)
Sodium: 139 mmol/L (ref 135–145)

## 2016-11-20 MED ORDER — CARVEDILOL 3.125 MG PO TABS: 3.1250 mg | ORAL_TABLET | Freq: Two times a day (BID) | ORAL | 3 refills | Status: DC

## 2016-11-20 NOTE — Patient Instructions (Signed)
Labs today (will call for abnormal results, otherwise no news is good news)  START Coreg 3.125 mg (1 Tablet) Two times Daily  Follow up in 2 weeks

## 2016-11-20 NOTE — Progress Notes (Signed)
Advanced Heart Failure Clinic Note    Primary Care: No PCP  Primary Cardiologist: 36. Gala Romney   HPI: Mark Baker is a 36 year old male with a past medical history of NICM, chronic combined systolic and diastolic CHF (EF 76-28%), OSA, obesity, atrial fibrillation, and medical noncompliance.   Admitted 10/31/16-11/14/16 with atrial fibrillation RVR and low output (co ox 38%). Started on Amiodarone and milrinone. TEE on 11/07/16 - EF 20% RV severely HK Massive LA (7.7cm), Severely dilated RA, Normal AV, Severe central MR, Moderate TR (RVSP 65-70), Trivial PR.  He spontaneously converted to NSR. He diuresed 44 pounds, discharge weight was 477 pounds. Discharged on torsemide 40mg  daily. Renal function inhibited starting ARB/ARNI.   He returns today for HF follow up. Overall feeling well, has been walking about a half mile a day without SOB. No SOB with walking from parking lot to clinic. No SOB with stairs. Has been wearing CPAP. Trying to follow a low salt diet, drinking less than 2L a day. Denies orthopnea and PND. Not weighing at home, does not have a scale that will measure above 430 pounds, we are ordering a new scale for him.    Past Medical History:  Diagnosis Date  . Asthma    childhood asthma no exacerbation since age 36   . Chronic combined systolic and diastolic CHF (congestive heart failure) (HCC)    a) ECHO (11/2012): EF 40-45%, RV fx difficult to see, nl size b) ECHO (05/2014): EF 20-25%, grade 2 DD, RV mildly dilated and sys fx mod reduced  . Hypertension   . NICM (nonischemic cardiomyopathy) (HCC)    a) (11/2012): normal coronaries  . OSA (obstructive sleep apnea)     Current Outpatient Prescriptions  Medication Sig Dispense Refill  . amiodarone (PACERONE) 200 MG tablet Take 1 tablet (200 mg total) by mouth 2 (two) times daily. 60 tablet 3  . atorvastatin (LIPITOR) 40 MG tablet Take 1 tablet (40 mg total) by mouth daily. 30 tablet 3  . digoxin (LANOXIN) 0.25 MG tablet Take 1  tablet (0.25 mg total) by mouth daily. 30 tablet 6  . hydrALAZINE (APRESOLINE) 50 MG tablet Take 1 tablet (50 mg total) by mouth every 8 (eight) hours. 90 tablet 6  . isosorbide mononitrate (IMDUR) 60 MG 24 hr tablet Take 1 tablet (60 mg total) by mouth daily. 30 tablet 6  . spironolactone (ALDACTONE) 25 MG tablet Take 1 tablet (25 mg total) by mouth daily. 30 tablet 3  . torsemide (DEMADEX) 20 MG tablet Take 2 tablets (40 mg total) by mouth daily. 60 tablet 6  . warfarin (COUMADIN) 7.5 MG tablet Take 1 tablet (7.5 mg total) by mouth daily at 6 PM. 45 tablet 6  . carvedilol (COREG) 3.125 MG tablet Take 1 tablet (3.125 mg total) by mouth 2 (two) times daily. 60 tablet 3   No current facility-administered medications for this encounter.     Allergies  Allergen Reactions  . Aspirin Hives, Itching and Rash      Social History   Social History  . Marital status: Married    Spouse name: N/A  . Number of children: N/A  . Years of education: N/A   Occupational History  . Not on file.   Social History Main Topics  . Smoking status: Never Smoker  . Smokeless tobacco: Never Used  . Alcohol use 0.0 oz/week     Comment: seldom.  . Drug use: No  . 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    Other Topics Concern  . Not on file   Social History Narrative   Lives in [place] with wife of 4 years.  Works for [employer] at [employer].  1 son and 2 daughters.     Denies cigarettes. Drank 1 beer last night 11/22/12. Denies drugs             Family History  Problem Relation Age of Onset  . Hypertension Father   . Heart failure Father     paternal side of family   . Hypertension Sister   . Stroke Maternal Grandmother   . Hypertension      maternal side of family     Vitals:   11/20/16 1114  BP: 122/90  Pulse: (!) 103  SpO2: 100%  Weight: (!) 477 lb (216.4 kg)     PHYSICAL EXAM: General:  Obese male, SOB.  HEENT: normal Neck: supple.  Hard  to assess JVD. Carotids 2+ bilat; no bruits. No lymphadenopathy or thyromegaly appreciated. Cor: PMI nondisplaced. Regular rate and rhythm, tachycardiac. No rubs, gallops or murmurs. Lungs: clear in all lobes.  Abdomen: Obese, soft, nontender, nondistended. No hepatosplenomegaly. No bruits or masses. Good bowel sounds. Extremities: no cyanosis, clubbing, rash, edema Neuro: alert & oriented x 3, cranial nerves grossly intact. moves all 4 extremities w/o difficulty. Affect pleasant.  ECG: Sinus tach rate 103.    ASSESSMENT & PLAN: 1. Chronic combined systolic and diastolic CHF: NICM, likely tachymediated although noncompliance may play a role.  - NYHA II - Volume status appears stable. Hard to assess JVD with body habitus.  - Continue torsemide 40mg  daily.  - Continue digoxin 0.25mg  daily, will get dig level .  - Continue hydralazine/imdur  - Continue Cleda Daub  - Add Coreg 3.25mg  BID.  - BMET today.  2. Obesity: - Encouraged him to continue portion control. - Encouraged him to continue exercising  3. OSA - Wearing CPAP  4. CKD stage III - BMET today.  5. PAF - EKG today shows Sinus tach with rate with of 103.  - Continue coumadin for anticoagulation - INR managed by [facility].   Follow up in 2 weeks.  Little Ishikawa, NP 11/20/16

## 2016-11-20 NOTE — Progress Notes (Signed)
Paramedicine Encounter    Patient ID: Mark Baker, male    DOB: 1981-05-24, 36 y.o.   MRN: [id number]   Patient Care Team: No Pcp Per Patient as PCP - General (General Practice)  Patient Active Problem List   Diagnosis Date Noted  . Encounter for therapeutic drug monitoring 11/19/2016  . New onset atrial fibrillation (HCC)   . Acute on chronic systolic heart failure (HCC) 10/31/2016  . Congestive heart failure (CHF) (HCC) 07/05/2016  . Morbid obesity with body mass index (BMI) of 60.0 to 69.9 in adult [facility]) 07/05/2016  . CKD (chronic kidney disease) stage 2, GFR 60-89 ml/min 07/05/2016  . Chronic systolic heart failure (HCC) 06/03/2014  . Hyperlipidemia 11/25/2012  . Prolonged QT interval 11/24/2012  . Healthcare maintenance 09/03/2012  . HTN (hypertension) 02/19/2012  . Sleep apnea 02/10/2012  . Morbid obesity (HCC) 02/10/2012    Current Outpatient Prescriptions:  .  amiodarone (PACERONE) 200 MG tablet, Take 1 tablet (200 mg total) by mouth 2 (two) times daily., Disp: 60 tablet, Rfl: 3 .  atorvastatin (LIPITOR) 40 MG tablet, Take 1 tablet (40 mg total) by mouth daily., Disp: 30 tablet, Rfl: 3 .  digoxin (LANOXIN) 0.25 MG tablet, Take 1 tablet (0.25 mg total) by mouth daily., Disp: 30 tablet, Rfl: 6 .  hydrALAZINE (APRESOLINE) 50 MG tablet, Take 1 tablet (50 mg total) by mouth every 8 (eight) hours., Disp: 90 tablet, Rfl: 6 .  isosorbide mononitrate (IMDUR) 60 MG 24 hr tablet, Take 1 tablet (60 mg total) by mouth daily., Disp: 30 tablet, Rfl: 6 .  spironolactone (ALDACTONE) 25 MG tablet, Take 1 tablet (25 mg total) by mouth daily., Disp: 30 tablet, Rfl: 3 .  torsemide (DEMADEX) 20 MG tablet, Take 2 tablets (40 mg total) by mouth daily., Disp: 60 tablet, Rfl: 6 .  warfarin (COUMADIN) 7.5 MG tablet, Take 1 tablet (7.5 mg total) by mouth daily at 6 PM., Disp: 45 tablet, Rfl: 6 Allergies  Allergen Reactions  . Aspirin Hives, Itching and Rash     Social History   Social History   . Marital status: Married    Spouse name: N/A  . Number of children: N/A  . Years of education: N/A   Occupational History  . Not on file.   Social History Main Topics  . Smoking status: Never Smoker  . Smokeless tobacco: Never Used  . Alcohol use 0.0 oz/week     Comment: seldom.  . Drug use: No  . Sexual activity: Not on file   Other Topics Concern  . Not on file   Social History Narrative   Lives in [place] with wife of 4 years.  Works for [employer] at [employer].  1 son and 2 daughters.     Denies cigarettes. Drank 1 beer last night 11/22/12. Denies drugs           Physical Exam      Future Appointments Date Time Provider Department Center  11/27/2016 10:35 AM [facility]  01/09/2017 8:00 PM [facility]   BP 122/90   Pulse (!) 103   Resp 18   Wt (!) 477 lb (216.4 kg)   BMI 59.62 kg/m    First meeting with pt in clinic.  Able to walk without difficulty. Walks around where he lives a lot.  Wearing cpap nightly.  Pt is out of work for the next 3-4 months.  He was told clinic would be ordering him scales that would  go up to his weight. Taking all meds. Has decreased his fluid intake.  Lives at home with wife and 3 kids and has another one that lives with her mom.  [facility]  Has [employer]. States meds are approx $10 a piece.  Will be going to [facility] medicine clinic [facility].  Pt uses pill box and fills it himself. Clinic will start him on carvedilol.   ACTION: Home visit completed Next visit planned for next week   Kerry Hough, EMT-Paramedic 11/20/16

## 2016-11-20 NOTE — Addendum Note (Signed)
Encounter addended by: Noralee Space, RN on: 11/20/2016 12:35 PM<BR>    Actions taken: Order list changed, Diagnosis association updated

## 2016-11-25 ENCOUNTER — Telehealth (HOSPITAL_COMMUNITY): Payer: Self-pay | Admitting: General Practice

## 2016-11-25 NOTE — Telephone Encounter (Signed)
I called and left message on patient voicemail to call office about scheduling and participating in the Cardiac Rehab program. I left my contact information on patient voicemail.  ° °

## 2016-11-28 ENCOUNTER — Ambulatory Visit (INDEPENDENT_AMBULATORY_CARE_PROVIDER_SITE_OTHER): Payer: Managed Care, Other (non HMO) | Admitting: Pharmacist

## 2016-11-28 DIAGNOSIS — I4891 Unspecified atrial fibrillation: Secondary | ICD-10-CM | POA: Diagnosis not present

## 2016-11-28 DIAGNOSIS — Z5181 Encounter for therapeutic drug level monitoring: Secondary | ICD-10-CM | POA: Diagnosis not present

## 2016-11-28 LAB — POCT INR: INR: 2.7

## 2016-11-29 ENCOUNTER — Other Ambulatory Visit (HOSPITAL_COMMUNITY): Payer: Self-pay

## 2016-11-29 NOTE — Progress Notes (Signed)
Paramedicine Encounter    Patient ID: Mark Baker, male    DOB: Dec 25, 1980, 36 y.o.   MRN: [id number]   Patient Care Team: No Pcp Per Patient as PCP - General (General Practice)  Patient Active Problem List   Diagnosis Date Noted  . Encounter for therapeutic drug monitoring 11/19/2016  . New onset atrial fibrillation (HCC)   . Acute on chronic systolic heart failure (HCC) 10/31/2016  . Congestive heart failure (CHF) (HCC) 07/05/2016  . Morbid obesity with body mass index (BMI) of 60.0 to 69.9 in adult [facility]) 07/05/2016  . CKD (chronic kidney disease) stage 2, GFR 60-89 ml/min 07/05/2016  . Chronic systolic heart failure (HCC) 06/03/2014  . Hyperlipidemia 11/25/2012  . Prolonged QT interval 11/24/2012  . Healthcare maintenance 09/03/2012  . HTN (hypertension) 02/19/2012  . Sleep apnea 02/10/2012  . Morbid obesity (HCC) 02/10/2012    Current Outpatient Prescriptions:  .  amiodarone (PACERONE) 200 MG tablet, Take 1 tablet (200 mg total) by mouth 2 (two) times daily., Disp: 60 tablet, Rfl: 3 .  atorvastatin (LIPITOR) 40 MG tablet, Take 1 tablet (40 mg total) by mouth daily., Disp: 30 tablet, Rfl: 3 .  carvedilol (COREG) 3.125 MG tablet, Take 1 tablet (3.125 mg total) by mouth 2 (two) times daily., Disp: 60 tablet, Rfl: 3 .  digoxin (LANOXIN) 0.25 MG tablet, Take 1 tablet (0.25 mg total) by mouth daily., Disp: 30 tablet, Rfl: 6 .  hydrALAZINE (APRESOLINE) 50 MG tablet, Take 1 tablet (50 mg total) by mouth every 8 (eight) hours., Disp: 90 tablet, Rfl: 6 .  isosorbide mononitrate (IMDUR) 60 MG 24 hr tablet, Take 1 tablet (60 mg total) by mouth daily., Disp: 30 tablet, Rfl: 6 .  spironolactone (ALDACTONE) 25 MG tablet, Take 1 tablet (25 mg total) by mouth daily., Disp: 30 tablet, Rfl: 3 .  torsemide (DEMADEX) 20 MG tablet, Take 2 tablets (40 mg total) by mouth daily., Disp: 60 tablet, Rfl: 6 .  warfarin (COUMADIN) 7.5 MG tablet, Take 1 tablet (7.5 mg total) by mouth daily at 6 PM., Disp: 45  tablet, Rfl: 6 Allergies  Allergen Reactions  . Aspirin Hives, Itching and Rash     Social History   Social History  . Marital status: Married    Spouse name: N/A  . Number of children: N/A  . Years of education: N/A   Occupational History  . Not on file.   Social History Main Topics  . Smoking status: Never Smoker  . Smokeless tobacco: Never Used  . Alcohol use 0.0 oz/week     Comment: seldom.  . Drug use: No  . 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    Other Topics Concern  . Not on file   Social History Narrative   Lives in [place] with wife of 4 years.  Works for [employer] at [employer].  1 son and 2 daughters.     Denies cigarettes. Drank 1 beer last night 11/22/12. Denies drugs           Physical Exam  Constitutional: He is oriented to person, place, and time.  Neck: Normal range of motion. No JVD present.  Cardiovascular: Normal rate and regular rhythm.   Pulmonary/Chest: Effort normal and breath sounds normal. No respiratory distress. He has no wheezes. He has no rales.  Abdominal: He exhibits no distension. There is no tenderness.  Musculoskeletal: Normal range of motion. He exhibits edema.  Neurological: He is  alert and oriented to person, place, and time.  Skin: Skin is warm and dry.        SAFE - 11/29/16 1400      Situation   Admitting diagnosis CHF   Heart failure history New   Comorbidities Atrial fibillation;HTN;Sleep Apnea   Readmitted within 30 days No   Hospital admission within past 12 months Yes   number of hospital admissions 1   number of ED visits 2   Target Weight 477 lbs     Assessment   Lives alone No   Primary support person Wife    Mode of transportation personal car   Other services involved None   Home equipement Scale     Weight   Weighs self daily No   Scale provided No   Records on weight chart No     Resources   Has "Living better w/heart failure" book Yes   Has HF Zone tool  Yes   Able to identify yellow zone signs/when to call MD Yes   Records zone daily Yes     Medications   Uses a pill box Yes   Who stocks the pill box self   Pill box checked this visit Yes   Pill box refilled this visit No   Difficulty obtaining medications No   Mail order medications No   Missed one or more doses of medications per week No     Nutrition   Patient receives meals on wheels No   Patient follows low sodium diet Yes   Has foods at home that meet the current recommended diet Yes   Patient follows low sugar/card diet No   Nutritional concerns/issues None     Activity Level   ADL's/Mobility Independent   How many feet can patient ambulate .25 mile   Typical activity level Moderate   Barriers Child care   Actvity tolerance: NYHA Class 1     Urine   Difficulty urinating No   Changes in urine None     Time spent with patient   Time spent with patient  102 Minutes         Community Paramedic Living Environment - 11/29/16 1400      Outside of House   Sidewalk and pathway to house is level and free from any hazards Yes   Driveway is free from debris/snow/ice Yes   Outside stairs are stable and have sturdy handrail Yes   Porch lights are working and provide adequate lighting Yes     Living Room   Furniture is of adequate height and offers arm rests that assist in getting up and down Yes   Floor is free from any clutter that would create tripping hazards Yes   All cords are either behind furniture or secured in a manner that does not cause trip hazards Yes   All rugs are secured to floor with double-sided tape N/A   Lighting is adequate to light room Yes   All lighting has an easily accessible on/off switch Yes   Phone is readily accessible near favorite seating areas Yes   Emergency numbers are printed near all phones in house No     Kitchen   Items used most often are within easy reach on low shelves Yes   Step stool is present, is sturdy and has a handrail  No   Floor mats are non-slip tread and secured to floor No   Oven controls are within easy reach Yes   [employer]  lighting is adequate and easy to reach switches Yes   ABC fire extinguisher is located in kitchen Yes     Stairs   Rennie Plowman is properly secured to stairs and/or all wood is properly secured N/A   Handrail is present and sturdy N/A   Stairs are free from any clutter N/A   Stairway is adequately lit N/A     Bathroom   Tub and shower have a non-slip surface No   Tub and/or shower have a grab bar for stability Yes   Toilet has a raised seat No   Grab bar is attached near toilet for assistance Yes   Pathway from bedroom to bathroom is free from clutter and well lit for ease of movement in the middle of the night Yes     Bedroom   Floor is free from clutter Yes   Light is near bed and is easy to turn on No   Phone is next to bed and within easy reach Yes   Flashlight is near bed in case of emergency Yes     General   Smoke detectors in all areas of the house (each floor) and tested Yes   CO detectors on each floor of house and tested No   Flashlights are handy throughout the home Yes   Resident has all medical information readily available and in an area emergency providers will easily find Yes   All heaters are away from any type of flammable material N/A     Overall Tips   Homeowner ha good non-skid shoes to move around house No   All assisted walking devices are readily accessible and in good condition No   There is a phone near the floor for ease of reach in case of a fall YES   All O2 tubing is less than 50 ft. and is not a trip hazard N/A   Resident has had an annual hearing and vision check by a physician Yes   Resident has the proper hearing and visual aids prescribed and are in good working order Yes   All medications are properly stored and labeled to avoid confusion on dosage, time to take, and avoidance of missed doses Yes       Future Appointments Date Time  Provider Department Center  12/04/2016 11:00 AM [facility] PA/NP [facility] None  12/05/2016 9:35 AM [facility]  01/09/2017 8:00 PM [facility]   BP 118/88 (BP Location: Right Arm, Patient Position: Sitting, Cuff Size: Large)   Pulse 86   Wt (!) 479 lb (217.3 kg)   SpO2 96%   BMI 59.87 kg/m  Weight yesterday-479 lb Last visit weight-477 lb  Mr Soulliere was seen at home today and reports feeling well. He had some minimal swelling in his legs but no respiratory difficulty. His medications were verified however his pillbox was already filled. He reports that he has no way to weigh himself daily because he needs a scale that will read >450 lbs. I have left a VM for Driscilla Moats regarding this. He advised that someone at the clinic was going to order him a special scale.  Jacqualine Code, EMT 11/29/16  ACTION: Home visit completed Next visit planned for 1 week

## 2016-12-02 ENCOUNTER — Encounter (HOSPITAL_COMMUNITY): Payer: Self-pay | Admitting: *Deleted

## 2016-12-02 NOTE — Progress Notes (Signed)
Received medical records request for patient's office notes from 11/20/16.  Request faxed in on 11/20/16.    Office notes faxed today to # [phone]. Original request will be scanned to patient's electronic medical record.

## 2016-12-04 ENCOUNTER — Ambulatory Visit (HOSPITAL_COMMUNITY)
Admission: RE | Admit: 2016-12-04 | Discharge: 2016-12-04 | Disposition: A | Discharge status: Home / Self Care | Payer: Managed Care, Other (non HMO) | Source: Ambulatory Visit | Attending: Internal Medicine | Admitting: Internal Medicine

## 2016-12-04 ENCOUNTER — Encounter (HOSPITAL_COMMUNITY): Payer: Self-pay

## 2016-12-04 ENCOUNTER — Other Ambulatory Visit (HOSPITAL_COMMUNITY): Payer: Self-pay

## 2016-12-04 VITALS — BP 124/90 | HR 108 | Wt >= 6400 oz

## 2016-12-04 DIAGNOSIS — I10 Essential (primary) hypertension: Secondary | ICD-10-CM | POA: Diagnosis not present

## 2016-12-04 DIAGNOSIS — I5022 Chronic systolic (congestive) heart failure: Secondary | ICD-10-CM

## 2016-12-04 DIAGNOSIS — G4733 Obstructive sleep apnea (adult) (pediatric): Secondary | ICD-10-CM | POA: Insufficient documentation

## 2016-12-04 DIAGNOSIS — Z7901 Long term (current) use of anticoagulants: Secondary | ICD-10-CM | POA: Diagnosis not present

## 2016-12-04 DIAGNOSIS — I429 Cardiomyopathy, unspecified: Secondary | ICD-10-CM | POA: Diagnosis not present

## 2016-12-04 DIAGNOSIS — J45909 Unspecified asthma, uncomplicated: Secondary | ICD-10-CM | POA: Insufficient documentation

## 2016-12-04 DIAGNOSIS — Z823 Family history of stroke: Secondary | ICD-10-CM | POA: Diagnosis not present

## 2016-12-04 DIAGNOSIS — E669 Obesity, unspecified: Secondary | ICD-10-CM | POA: Diagnosis not present

## 2016-12-04 DIAGNOSIS — N183 Chronic kidney disease, stage 3 unspecified: Secondary | ICD-10-CM

## 2016-12-04 DIAGNOSIS — N529 Male erectile dysfunction, unspecified: Secondary | ICD-10-CM | POA: Diagnosis not present

## 2016-12-04 DIAGNOSIS — Z79899 Other long term (current) drug therapy: Secondary | ICD-10-CM | POA: Diagnosis not present

## 2016-12-04 DIAGNOSIS — Z6841 Body Mass Index (BMI) 40.0 and over, adult: Secondary | ICD-10-CM | POA: Insufficient documentation

## 2016-12-04 DIAGNOSIS — I5042 Chronic combined systolic (congestive) and diastolic (congestive) heart failure: Secondary | ICD-10-CM | POA: Diagnosis not present

## 2016-12-04 DIAGNOSIS — I130 Hypertensive heart and chronic kidney disease with heart failure and stage 1 through stage 4 chronic kidney disease, or unspecified chronic kidney disease: Secondary | ICD-10-CM | POA: Diagnosis not present

## 2016-12-04 DIAGNOSIS — Z8249 Family history of ischemic heart disease and other diseases of the circulatory system: Secondary | ICD-10-CM | POA: Diagnosis not present

## 2016-12-04 DIAGNOSIS — I480 Paroxysmal atrial fibrillation: Secondary | ICD-10-CM | POA: Diagnosis not present

## 2016-12-04 DIAGNOSIS — N522 Drug-induced erectile dysfunction: Secondary | ICD-10-CM

## 2016-12-04 MED ORDER — TORSEMIDE 20 MG PO TABS: ORAL_TABLET | ORAL | 6 refills | Status: DC

## 2016-12-04 MED ORDER — SILDENAFIL CITRATE 100 MG PO TABS: 100.0000 mg | ORAL_TABLET | Freq: Every day | ORAL | 3 refills | Status: DC | PRN

## 2016-12-04 MED ORDER — TORSEMIDE 20 MG PO TABS: 40.0000 mg | ORAL_TABLET | Freq: Two times a day (BID) | ORAL | 6 refills | Status: DC

## 2016-12-04 NOTE — Progress Notes (Signed)
Paramedicine Encounter   Patient ID: Mark Baker , male,   DOB: Sep 20, 1980,35 y.o.,  MRN: [id number]   Met patient in clinic today with provider.  Time spent with patient 64mn   KMarylouise Stacks EMT-Paramedic 12/04/2016   Pt is up 6lbs from last clinic visit. Increased swelling to feet/ankles. No increased sob. States he is reading labels and really trying to stay on low sodium foods. He is trying to stay at 2L -2.5L of fluid intake.  JKennyth Losemade him appointment with PCP at urgent care on 11:30 on 4/30.  He is getting on viagra and NP advised him that on days he plans to use viagra do not use his hydralazine and imdur.  His torsemide is being increased as noted in provider notes.   ACTION: Home visit completed Next visit planned for next week

## 2016-12-04 NOTE — Patient Instructions (Addendum)
INCREASE Torsemide to 40 mg (2 Tablets) in the AM and 20 mg (1 Tablet) in the PM.  Follow up in 2-3 weeks.

## 2016-12-04 NOTE — Progress Notes (Signed)
CSW referred to assist with obtaining a PCP. CSW referred patient to [facility] and patient scheduled for appointment on Monday April 30. Patient appreciative for the assistance. CSW available as needed. Lasandra Beech, LCSW, CCSW-MCS [phone]

## 2016-12-04 NOTE — Addendum Note (Signed)
Encounter addended by: Marcy Siren, LCSW on: 12/04/2016  3:21 PM<BR>    Actions taken: Sign clinical note

## 2016-12-04 NOTE — Progress Notes (Signed)
Advanced Heart Failure Clinic Note    Primary Care: No PCP  Primary Cardiologist: Dr. 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   Admitted 10/31/16-11/14/16 with atrial fibrillation RVR and low output (co ox 38%). Started on Amiodarone and milrinone. TEE on 11/07/16 - EF 20% RV severely HK Massive LA (7.7cm), Severely dilated RA, Normal AV, Severe central MR, Moderate TR (RVSP 65-70), Trivial PR.  He spontaneously converted to NSR. He diuresed 44 pounds, discharge weight was 477 pounds. Discharged on torsemide 40mg  daily. Renal function inhibited starting ARB/ARNI.   Today he returns for HF follow up. Weight up 4 pounds since last visit 2 weeks ago. Overall feeling well, denies SOB. Still not weighing at home, bariatric scale on order. He has had increased stress in the past 2 weeks with his dad being hospitalized. Says he has been adhering to a low sodium diet. Drinking less than 2L a day. Sometimes drinks 2.5L a day. Going to the [facility] 3 times a week for exercise. No SOB with exercise. No SOB with stairs. Taking all medications, has not missed any doses. Worried about his sexual health, Coreg is affecting his ability to keep an erection.    Past Medical History:  Diagnosis Date  . Asthma    childhood asthma no exacerbation since age 52   . Chronic combined systolic and diastolic CHF (congestive heart failure) (HCC)    a) ECHO (11/2012): EF 40-45%, RV fx difficult to see, nl size b) ECHO (05/2014): EF 20-25%, grade 2 DD, RV mildly dilated and sys fx mod reduced  . Hypertension   . NICM (nonischemic cardiomyopathy) (HCC)    a) (11/2012): normal coronaries  . OSA (obstructive sleep apnea)     Current Outpatient Prescriptions  Medication Sig Dispense Refill  . amiodarone (PACERONE) 200 MG tablet Take 1 tablet (200 mg total) by mouth 2  (two) times daily. 60 tablet 3  . atorvastatin (LIPITOR) 40 MG tablet Take 1 tablet (40 mg total) by mouth daily. 30 tablet 3  . carvedilol (COREG) 3.125 MG tablet Take 1 tablet (3.125 mg total) by mouth 2 (two) times daily. 60 tablet 3  . digoxin (LANOXIN) 0.25 MG tablet Take 1 tablet (0.25 mg total) by mouth daily. 30 tablet 6  . hydrALAZINE (APRESOLINE) 50 MG tablet Take 1 tablet (50 mg total) by mouth every 8 (eight) hours. 90 tablet 6  . isosorbide mononitrate (IMDUR) 60 MG 24 hr tablet Take 1 tablet (60 mg total) by mouth daily. 30 tablet 6  . spironolactone (ALDACTONE) 25 MG tablet Take 1 tablet (25 mg total) by mouth daily. 30 tablet 3  . torsemide (DEMADEX) 20 MG tablet Take 2 tablets (40 mg total) by mouth daily. 60 tablet 6  . warfarin (COUMADIN) 7.5 MG tablet Take 1 tablet (7.5 mg total) by mouth daily at 6 PM. 45 tablet 6   No current facility-administered medications for this encounter.     Allergies  Allergen Reactions  . Aspirin Hives, Itching and Rash      Social History   Social History  . Marital status: Married    Spouse name: N/A  . Number of children: N/A  . Years of education: N/A   Occupational History  . Not on file.   Social History Main Topics  . Smoking status: Never Smoker  . Smokeless tobacco:  Never Used  . Alcohol use 0.0 oz/week     Comment: seldom.  . Drug use: No  . 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    Other Topics Concern  . Not on file   Social History Narrative   Lives in [place] with wife of 4 years.  Works for [employer] at [employer].  1 son and 2 daughters.     Denies cigarettes. Drank 1 beer last night 11/22/12. Denies drugs             Family History  Problem Relation Age of Onset  . Hypertension Father   . Heart failure Father     paternal side of family   . Hypertension Sister   . Stroke Maternal Grandmother   . Hypertension      maternal side of family     Vitals:    12/04/16 1101  BP: 124/90  Pulse: (!) 108  SpO2: 98%  Weight: (!) 483 lb 3.2 oz (219.2 kg)     PHYSICAL EXAM: General: Obese male, NAD. Walked into clinic without difficulty.  HEENT: normal, atraumatic.  Neck: supple.  Hard to assess JVD. Carotids 2+ bilat; no bruits. No lymphadenopathy or thyromegaly appreciated. Cor: PMI nondisplaced. Regular rate and rhythm, tachycardiac. No rubs, gallops or murmurs. Lungs: clear in all lobes.  Abdomen: Obese, soft, nontender, nondistended. No hepatosplenomegaly. No bruits or masses. Good bowel sounds. Extremities: no cyanosis, clubbing, rash. Warm. 1+ pedal edema.  Neuro: alert & oriented x 3, cranial nerves grossly intact. moves all 4 extremities w/o difficulty. Affect pleasant.    ASSESSMENT & PLAN: 1. Chronic combined systolic and diastolic CHF: 10/2016 EF 20-25% NICM, likely tachymediated although noncompliance may play a role.  - NYHA II - Volume status appears elevated - Continue torsemide 40mg  in the am, add 20mg  torsemide in the afternoon.  - Continue digoxin 0.43mcg. Level ok in April 2018.  - Continue hydralazine/imdur  - Continue Cleda Daub  - Continue Coreg 3.125mg  BID, patient refused increase in Coreg due to erectile dysfunction. - Has been on Entresto in the past, however it was stopped due to cough.  - Will need Echo to reassess EF in June.  2. Obesity: - Encouraged him to reduce portion size - encouraged him to increase his activity, currently going to the [facility] 3 times a week, suggested that he go 5 times a week.  3. OSA - Wears CPAP at night.  4. CKD stage III - Creatinine improving, stable.  5. PAF - Continue Amiodarone - rate controlled  - This patients CHA2DS2-VASc Score and unadjusted Ischemic Stroke Rate (% per year) is equal to 2.2 % stroke rate/year from a score of 2 Above score calculated as 1 point each if present [CHF, HTN, DM, Vascular=MI/PAD/Aortic Plaque, Age if 65-74, or Male], 2 points each if present [Age >  75, or Stroke/TIA/TE] - Continue coumadin for anticoagulation.  6. Erectile dysfunction - Provided patient with an RX for Viagra.  - Instructed him that he cannot take Imdur when he takes Viagra, so if he is going to use Viagra, be sure that he has not taken Imdur that day. He repeated back instructions and verbalized understanding.   Little Ishikawa, NP 12/04/16

## 2016-12-05 ENCOUNTER — Ambulatory Visit (INDEPENDENT_AMBULATORY_CARE_PROVIDER_SITE_OTHER): Payer: Managed Care, Other (non HMO) | Admitting: *Deleted

## 2016-12-05 ENCOUNTER — Encounter (HOSPITAL_COMMUNITY): Payer: Self-pay | Admitting: *Deleted

## 2016-12-05 ENCOUNTER — Telehealth (HOSPITAL_COMMUNITY): Payer: Self-pay | Admitting: Pharmacist

## 2016-12-05 DIAGNOSIS — Z5181 Encounter for therapeutic drug level monitoring: Secondary | ICD-10-CM | POA: Diagnosis not present

## 2016-12-05 DIAGNOSIS — I4891 Unspecified atrial fibrillation: Secondary | ICD-10-CM | POA: Diagnosis not present

## 2016-12-05 LAB — POCT INR: INR: 2.5

## 2016-12-05 NOTE — Telephone Encounter (Signed)
Viagra 100 mg tablets (#5/30 day) PA approved by [employer] through 12/05/17.   Tyler Deis. Bonnye Fava, PharmD, BCPS, CPP Clinical Pharmacist Pager: [phone] Phone: [phone] 12/05/2016 2:59 PM

## 2016-12-05 NOTE — Progress Notes (Signed)
Patient requested for office notes from yesterday's visit (12/04/16) be faxed to [facility] @ [phone] and to mail a copy to his home address.  Office Notes faxed today and mailed copy to patient's home address.

## 2016-12-08 NOTE — Progress Notes (Deleted)
   Subjective:    Patient ID: Mark Baker, male    DOB: Nov 17, 1980, 36 y.o.   MRN: [id number]  HPI  Mark Baker is a 36 yo male with numerous uncontrolled medical conditions who presents after a two-week hospitalization from 3/22-4/5 for acute on chronic systolic/diastolic heart failure exacerbation with cardiogenic shock which was triggered volume overload after new onset paroxysmal A. fib with RVR and acute on chronic kidney disease stage III.  His is here for a hosp f/u and to establish primary care. He was previously followed in the [facility] internal medicine residency clinic but last visit was 2 yrs ago so recently has only seen his cardiologist (primary Dr. Gala Romney). He does have a long established history of non-compliance with meds and visits.  1. A/C Diastolic/Systolic Heart Failure with a non-ischemic cardiomyopathy. Patient was diuresed 44 pounds over his hospitalization. He was minimally responsive to IV Lasix so had to have a PICC line placed but was stable on torsemide at discharge.  Discharge weight 477 pounds. EF~20-25% on echo 10/2016. On torsemide 40 mg daily, hydralazine 50 mg three times a day, + imdur 60 mg daily, spironolactone 25mg  qd, digoxin 0.25mg  qd, carvedilol 3.125 bid. Followed in the HF clinic and at his last visit 4/25 he had gained 6 lbs despite his attempts to adhere to a low sodium diet and fluid restrict to 2-2.5L qd so torsemide 20mg  qlunch was added.  Waiting to get bariatric scale into home to monitor weights. 2. PAF - new onset 10/2016 which spontaneously converted in-hospital on amiodarone 200 mg twice a day. On coumadin followed in [facility] Coumadin clinic and has been at goal with INR 2.5 4/26 and next appt is 5/3. 3. A/C CKD Stage III - Creatinine peaked at 2.07 but was down to 1.7 at hosp d/c. Baseline creatinine 1.3-1.5  4. OSA: on cpap started after hosp d/c 11/14/16 5. Morbid Obesity - BMI 60. Started going to the [facility] 3x/wk for exercise. 6. HLD: atorvastatin 40 -  last lipid panel 06/2016 LDL 109, non-HDL 155 7. HTN: could not tol losartan due to cough 8. Asthma 9. ED: Viagra - has been instructed not to use his hydralazine or imdur on days he plans to use the viagra.  Review of Systems     Objective:   Physical Exam     06/2016 a1c 5.6 10/2016 tsh 0.914    Assessment & Plan:  cmp? Lipid? - prob don't Pt has appts with coumadin clinic 5/3, heart failure clinic 5/16, and sleep med 5/31

## 2016-12-09 ENCOUNTER — Ambulatory Visit: Payer: Self-pay | Admitting: Family Medicine

## 2016-12-10 ENCOUNTER — Telehealth (HOSPITAL_COMMUNITY): Payer: Self-pay | Admitting: *Deleted

## 2016-12-10 ENCOUNTER — Telehealth (HOSPITAL_COMMUNITY): Payer: Self-pay | Admitting: General Practice

## 2016-12-10 NOTE — Telephone Encounter (Signed)
I called and left message on patient voicemail to call office about scheduling and participating in the Cardiac Rehab program. I left my contact information on patient voicemail.  ° °

## 2016-12-10 NOTE — Telephone Encounter (Signed)
Patient called and left message on triage line with a questions regarding his disability. I called patient back but had to leave a message asking for him to call us back.

## 2016-12-13 ENCOUNTER — Other Ambulatory Visit (HOSPITAL_COMMUNITY): Payer: Self-pay

## 2016-12-13 NOTE — Progress Notes (Signed)
Paramedicine Encounter    Patient ID: Mark Baker, male    DOB: 1981/04/26, 36 y.o.   MRN: [id number]   Patient Care Team: No Pcp Per Patient as PCP - General (General Practice)  Patient Active Problem List   Diagnosis Date Noted  . Encounter for therapeutic drug monitoring 11/19/2016  . New onset atrial fibrillation (HCC)   . Acute on chronic systolic heart failure (HCC) 10/31/2016  . Congestive heart failure (CHF) (HCC) 07/05/2016  . Morbid obesity with body mass index (BMI) of 60.0 to 69.9 in adult [facility]) 07/05/2016  . CKD (chronic kidney disease) stage 2, GFR 60-89 ml/min 07/05/2016  . Chronic systolic heart failure (HCC) 06/03/2014  . Hyperlipidemia 11/25/2012  . Prolonged QT interval 11/24/2012  . Healthcare maintenance 09/03/2012  . HTN (hypertension) 02/19/2012  . Sleep apnea 02/10/2012  . Morbid obesity (HCC) 02/10/2012    Current Outpatient Prescriptions:  .  amiodarone (PACERONE) 200 MG tablet, Take 1 tablet (200 mg total) by mouth 2 (two) times daily., Disp: 60 tablet, Rfl: 3 .  atorvastatin (LIPITOR) 40 MG tablet, Take 1 tablet (40 mg total) by mouth daily., Disp: 30 tablet, Rfl: 3 .  carvedilol (COREG) 3.125 MG tablet, Take 1 tablet (3.125 mg total) by mouth 2 (two) times daily., Disp: 60 tablet, Rfl: 3 .  digoxin (LANOXIN) 0.25 MG tablet, Take 1 tablet (0.25 mg total) by mouth daily., Disp: 30 tablet, Rfl: 6 .  hydrALAZINE (APRESOLINE) 50 MG tablet, Take 1 tablet (50 mg total) by mouth every 8 (eight) hours., Disp: 90 tablet, Rfl: 6 .  isosorbide mononitrate (IMDUR) 60 MG 24 hr tablet, Take 1 tablet (60 mg total) by mouth daily., Disp: 30 tablet, Rfl: 6 .  sildenafil (VIAGRA) 100 MG tablet, Take 1 tablet (100 mg total) by mouth daily as needed for erectile dysfunction., Disp: 5 tablet, Rfl: 3 .  spironolactone (ALDACTONE) 25 MG tablet, Take 1 tablet (25 mg total) by mouth daily., Disp: 30 tablet, Rfl: 3 .  torsemide (DEMADEX) 20 MG tablet, Take 40 mg (2 Tablets) in the  AM and 20 mg (1 Tablet) in the PM, Disp: 90 tablet, Rfl: 6 .  warfarin (COUMADIN) 7.5 MG tablet, Take 1 tablet (7.5 mg total) by mouth daily at 6 PM., Disp: 45 tablet, Rfl: 6 Allergies  Allergen Reactions  . Aspirin Hives, Itching and Rash     Social History   Social History  . Marital status: Married    Spouse name: N/A  . Number of children: N/A  . Years of education: N/A   Occupational History  . Not on file.   Social History Main Topics  . Smoking status: Never Smoker  . Smokeless tobacco: Never Used  . Alcohol use 0.0 oz/week     Comment: seldom.  . Drug use: No  . 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    Other Topics Concern  . Not on file   Social History Narrative   Lives in [place] with wife of 4 years.  Works for [employer] at [employer].  1 son and 2 daughters.     Denies cigarettes. Drank 1 beer last night 11/22/12. Denies drugs           Physical Exam  Neck: Normal range of motion. No JVD present.  Cardiovascular: Normal rate and regular rhythm.   Pulmonary/Chest: Effort normal and breath sounds normal. No respiratory distress. He has no wheezes. He has no rales.  Abdominal: Soft. Bowel sounds are normal.  Musculoskeletal: Normal range of motion. He exhibits edema.  Skin: Skin is warm and dry.        SAFE - 11/29/16 1400      Situation   Admitting diagnosis CHF   Heart failure history New   Comorbidities Atrial fibillation;HTN;Sleep Apnea   Readmitted within 30 days No   Hospital admission within past 12 months Yes   number of hospital admissions 1   number of ED visits 2   Target Weight 477 lbs     Assessment   Lives alone No   Primary support person Wife    Mode of transportation personal car   Other services involved None   Home equipement Scale     Weight   Weighs self daily No   Scale provided No   Records on weight chart No     Resources   Has "Living better w/heart failure" book Yes   Has  HF Zone tool Yes   Able to identify yellow zone signs/when to call MD Yes   Records zone daily Yes     Medications   Uses a pill box Yes   Who stocks the pill box self   Pill box checked this visit Yes   Pill box refilled this visit No   Difficulty obtaining medications No   Mail order medications No   Missed one or more doses of medications per week No     Nutrition   Patient receives meals on wheels No   Patient follows low sodium diet Yes   Has foods at home that meet the current recommended diet Yes   Patient follows low sugar/card diet No   Nutritional concerns/issues None     Activity Level   ADL's/Mobility Independent   How many feet can patient ambulate .25 mile   Typical activity level Moderate   Barriers Child care   Actvity tolerance: NYHA Class 1     Urine   Difficulty urinating No   Changes in urine None     Time spent with patient   Time spent with patient  40 Minutes        Future Appointments Date Time Provider Department Center  12/16/2016 8:30 AM Sherren Mocha, MD PCP-PCP [facility]  12/16/2016 2:45 PM [facility]  12/25/2016 11:00 AM [facility] PA/NP [facility] None  01/09/2017 8:00 PM [facility] 5 [facility]   BP (!) 140/92 (BP Location: Right Arm, Patient Position: Sitting, Cuff Size: Large)   Pulse 84   Resp 16   Wt (!) 482 lb 12.8 oz (219 kg)   SpO2 94%   BMI 60.35 kg/m  Weight yesterday- N/A Last visit weight- N/A  Mark Baker was seen at home today and reports feeling well. He was at the gym this morning working out and stated he did not have any SOB or chest pain. His scale was delivered and he was able to weigh at home and reports that the weight today was the same as his last doctor's appointment. His medication bottles were in the car that his wife took to the hair dresser but he knows his medications so was able verify everything. Amiodarone and atorvastatin were ordered from [facility]. He does not  have any concerns at this time.  Jacqualine Code, EMT 12/13/16  ACTION: Home visit completed Next visit planned for 1 week

## 2016-12-15 NOTE — Progress Notes (Signed)
Subjective:    Patient ID: Mark Baker, male    DOB: [birth year], 36 y.o.   MRN: [id number] Chief Complaint  Patient presents with  . Establish Care    HPI  Mark Baker is a 36 yo male with numerous uncontrolled medical conditions who presents after a two-week hospitalization from 3/22-4/5 for acute on chronic systolic/diastolic heart failure exacerbation with cardiogenic shock which was triggered volume overload after new onset paroxysmal A. fib with RVR and acute on chronic kidney disease stage III.  His is here for a hosp f/u and to establish primary care. He was previously followed in the [facility] internal medicine residency clinic but last visit was 2 yrs ago so recently has only seen his cardiologist (primary Dr. Gala Romney). He does have a long established history of non-compliance with meds and visits.  1. A/C Diastolic/Systolic Heart Failure with a non-ischemic cardiomyopathy. Patient was diuresed 44 pounds over his hospitalization. He was minimally responsive to IV Lasix so had to have a PICC line placed but was stable on torsemide at discharge.  Discharge weight 477 pounds. EF~20-25% on echo 10/2016. NYHA class I. On torsemide 40 mg daily, hydralazine 50 mg three times a day, + imdur 60 mg daily, spironolactone 25mg  qd, digoxin 0.25mg  qd, carvedilol 3.125 bid. Followed in the HF clinic and at his last visit 4/25 he had gained 6 lbs despite his attempts to adhere to a low sodium diet and fluid restrict to 2-2.5L qd so torsemide 20mg  qlunch was added.  Got new bariatric scale into home to monitor weights. Is being offered for pt to participate in Cardiac Rehab program but is already going to the gym [employer] [facility] on his own and feels he is doing well w/ that - walk, basketball, elliptical, bike - going to start swimming tomorrow.  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  2. PAFib - new onset 10/2016 which spontaneously converted in-hospital on amiodarone 200 mg twice a day.  On coumadin followed in [facility] Coumadin clinic and has been at goal with INR 2.5 4/26 and next appt is 5/3. 3. A/C CKD Stage III - Creatinine peaked at 2.07 but was down to 1.7 at hosp d/c. Baseline creatinine 1.3-1.5  4. OSA: on cpap started after hosp d/c 11/14/16 - he is getting sig bills. Is trying to use every night.  Occ comes off  5. Morbid Obesity - BMI 60. Started going to the [employer] 3x/wk for exercise. 6. HLD: atorvastatin 40 - last lipid panel 06/2016 LDL 109, non-HDL 155.  7. HTN: could not tol losartan due to cough 8. Asthma - grew out of it. 9. ED: Viagra - has been instructed not to use his hydralazine or imdur on days he plans to use the viagra.  Lab Results  Component Value Date   HGBA1C 5.6 07/06/2016   HGBA1C 5.7 (H) 05/22/2014   HGBA1C 5.4 11/08/2012   HGBA1C 5.6 02/10/2012     Past Medical History:  Diagnosis Date  . Asthma    childhood asthma no exacerbation since age 65   . Chronic combined systolic and diastolic CHF (congestive heart failure) (HCC)    a) ECHO (11/2012): EF 40-45%, RV fx difficult to see, nl size b) ECHO (05/2014): EF 20-25%, grade 2 DD, RV mildly dilated and sys fx mod reduced  . Hypertension   . NICM (nonischemic cardiomyopathy) (HCC)    a) (11/2012): normal coronaries  . OSA (obstructive sleep apnea)    Past Surgical History:  Procedure Laterality Date  . ANTERIOR CRUCIATE LIGAMENT REPAIR     right  . CARDIOVERSION N/A 11/07/2016   Procedure: CARDIOVERSION;  Surgeon: Dolores Patty, MD;  Location: [facility] OR;  Service: Cardiovascular;  Laterality: N/A;  Will need extra help positioning patient  . LEFT HEART CATHETERIZATION WITH CORONARY ANGIOGRAM N/A 11/24/2012   Procedure: LEFT HEART CATHETERIZATION WITH CORONARY ANGIOGRAM;  Surgeon: Peter M Swaziland, MD;  Location: [facility] CATH LAB;  Service: Cardiovascular;  Laterality: N/A;  . TEE WITHOUT CARDIOVERSION N/A 11/07/2016   Procedure: TRANSESOPHAGEAL ECHOCARDIOGRAM (TEE);  Surgeon: Dolores Patty,  MD;  Location: [facility] OR;  Service: Cardiovascular;  Laterality: N/A;   Current Outpatient Prescriptions on File Prior to Visit  Medication Sig Dispense Refill  . amiodarone (PACERONE) 200 MG tablet Take 1 tablet (200 mg total) by mouth 2 (two) times daily. 60 tablet 3  . atorvastatin (LIPITOR) 40 MG tablet Take 1 tablet (40 mg total) by mouth daily. 30 tablet 3  . carvedilol (COREG) 3.125 MG tablet Take 1 tablet (3.125 mg total) by mouth 2 (two) times daily. 60 tablet 3  . digoxin (LANOXIN) 0.25 MG tablet Take 1 tablet (0.25 mg total) by mouth daily. 30 tablet 6  . hydrALAZINE (APRESOLINE) 50 MG tablet Take 1 tablet (50 mg total) by mouth every 8 (eight) hours. 90 tablet 6  . isosorbide mononitrate (IMDUR) 60 MG 24 hr tablet Take 1 tablet (60 mg total) by mouth daily. 30 tablet 6  . sildenafil (VIAGRA) 100 MG tablet Take 1 tablet (100 mg total) by mouth daily as needed for erectile dysfunction. 5 tablet 3  . spironolactone (ALDACTONE) 25 MG tablet Take 1 tablet (25 mg total) by mouth daily. 30 tablet 3  . torsemide (DEMADEX) 20 MG tablet Take 40 mg (2 Tablets) in the AM and 20 mg (1 Tablet) in the PM 90 tablet 6  . warfarin (COUMADIN) 7.5 MG tablet Take 1 tablet (7.5 mg total) by mouth daily at 6 PM. 45 tablet 6   No current facility-administered medications on file prior to visit.    Allergies  Allergen Reactions  . Aspirin Hives, Itching and Rash   Family History  Problem Relation Age of Onset  . Hypertension Father   . Heart failure Father        paternal side of family   . Hypertension Sister   . Stroke Maternal Grandmother   . Hypertension Unknown        maternal side of family    Social History   Social History  . Marital status: Married    Spouse name: N/A  . Number of children: N/A  . Years of education: N/A   Social History Main Topics  . Smoking status: Never Smoker  . Smokeless tobacco: Never Used  . Alcohol use 0.0 oz/week     Comment: seldom.  . Drug use: No  .  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    Other Topics Concern  . None   Social History Narrative   Lives in [place] with wife of 4 years.  Works for [employer] at [employer].  1 son and 2 daughters.     Denies cigarettes. Drank 1 beer last night 11/22/12. Denies drugs          Depression screen [facility] 2/9 12/16/2016 01/18/2015 01/16/2015 12/09/2012 11/13/2012  Decreased Interest 0 0 0 0 0  Down, Depressed, Hopeless 0 0 0 0 0  PHQ -  2 Score 0 0 0 0 0     Review of Systems See hpi    Objective:   Physical Exam  Constitutional: He is oriented to person, place, and time. He appears well-developed and well-nourished. No distress.  HENT:  Head: Normocephalic and atraumatic.  Eyes: Conjunctivae are normal. Pupils are equal, round, and reactive to light. No scleral icterus.  Neck: Normal range of motion. Neck supple. No thyromegaly present.  Cardiovascular: Normal rate, regular rhythm, normal heart sounds and intact distal pulses.   Difficult to appreciate due to body habitus  Pulmonary/Chest: Effort normal and breath sounds normal. No respiratory distress.  Musculoskeletal: He exhibits no edema.  Lymphadenopathy:    He has no cervical adenopathy.  Neurological: He is alert and oriented to person, place, and time.  Skin: Skin is warm and dry. He is not diaphoretic.  Psychiatric: He has a normal mood and affect. His behavior is normal.      06/2016 a1c 5.6 10/2016 tsh 0.914    target weight 477 lbs BP 130/82   Pulse 92   Temp 98.1 F (36.7 C) (Oral)   Resp 16   Ht 6' 1.82" (1.875 m)   Wt (!) 479 lb (217.3 kg) Comment: pt wt too high for scale. Pt reported wt from this morning  SpO2 93%   BMI 61.80 kg/m   Assessment & Plan:  cmp? Lipid - fasting pneumovax Pt has appts with coumadin clinic 5/7 (this afternoon), heart failure clinic 5/16, and sleep med 5/31 RTC 6 mos for CPE  1. Pure hypercholesterolemia   2. CKD (chronic kidney disease) stage 2,  GFR 60-89 ml/min   3. Obstructive sleep apnea syndrome   4. Morbid obesity with body mass index (BMI) of 60.0 to 69.9 in adult (HCC)   5. Chronic combined systolic and diastolic congestive heart failure (HCC)   6. Essential hypertension    Pt here to establish for PCP but currently getting all meds and current chronic disease mngmnt through specialists.   Orders Placed This Encounter  Procedures  . Pneumococcal polysaccharide vaccine 23-valent greater than or equal to 2yo subcutaneous/IM  . Lipid panel    Order Specific Question:   Has the patient fasted?    Answer:   Yes  . Comprehensive metabolic panel    Order Specific Question:   Has the patient fasted?    Answer:   Yes  . Care order/instruction:    Scheduling Instructions:     Complete orders, AVS and go.    Norberto Sorenson, M.D.  Primary Care at [facility] [address] [place], [place] [address] [phone] phone [phone] fax  12/22/16 10:48 PM

## 2016-12-16 ENCOUNTER — Encounter: Payer: Self-pay | Admitting: Family Medicine

## 2016-12-16 ENCOUNTER — Ambulatory Visit (INDEPENDENT_AMBULATORY_CARE_PROVIDER_SITE_OTHER): Payer: Managed Care, Other (non HMO)

## 2016-12-16 ENCOUNTER — Ambulatory Visit (INDEPENDENT_AMBULATORY_CARE_PROVIDER_SITE_OTHER): Payer: Managed Care, Other (non HMO) | Admitting: Family Medicine

## 2016-12-16 VITALS — BP 130/82 | HR 92 | Temp 98.1°F | Resp 16 | Ht 73.82 in | Wt >= 6400 oz

## 2016-12-16 DIAGNOSIS — N182 Chronic kidney disease, stage 2 (mild): Secondary | ICD-10-CM | POA: Diagnosis not present

## 2016-12-16 DIAGNOSIS — G4733 Obstructive sleep apnea (adult) (pediatric): Secondary | ICD-10-CM | POA: Diagnosis not present

## 2016-12-16 DIAGNOSIS — Z23 Encounter for immunization: Secondary | ICD-10-CM | POA: Diagnosis not present

## 2016-12-16 DIAGNOSIS — E7800 Pure hypercholesterolemia, unspecified: Secondary | ICD-10-CM

## 2016-12-16 DIAGNOSIS — I5042 Chronic combined systolic (congestive) and diastolic (congestive) heart failure: Secondary | ICD-10-CM

## 2016-12-16 DIAGNOSIS — I10 Essential (primary) hypertension: Secondary | ICD-10-CM | POA: Diagnosis not present

## 2016-12-16 DIAGNOSIS — I4891 Unspecified atrial fibrillation: Secondary | ICD-10-CM

## 2016-12-16 DIAGNOSIS — Z6841 Body Mass Index (BMI) 40.0 and over, adult: Secondary | ICD-10-CM

## 2016-12-16 DIAGNOSIS — Z5181 Encounter for therapeutic drug level monitoring: Secondary | ICD-10-CM

## 2016-12-16 LAB — POCT INR: INR: 2.9

## 2016-12-16 NOTE — Patient Instructions (Addendum)
IF you received an x-ray today, you will receive an invoice from [facility]. Please contact [facility] at [phone] with questions or concerns regarding your invoice.   IF you received labwork today, you will receive an invoice from [place]. Please contact [facility] at [phone] with questions or concerns regarding your invoice.   Our billing staff will not be able to assist you with questions regarding bills from these companies.  You will be contacted with the lab results as soon as they are available. The fastest way to get your results is to activate your My Chart account. Instructions are located on the last page of this paperwork. If you have not heard from [place] regarding the results in 2 weeks, please contact this office.      Exercise Guidelines During Cardiac Rehabilitation When you are recovering from a heart condition, such as from heart surgery, heart attack, or heart failure, it is important to have heart-healthy habits, including exercise routines. Discuss an appropriate exercise program with your heart specialist (cardiologist) and rehabilitation therapist. It is important to design a program that is safe and effective for you. The program should meet your specific abilities and needs. Walking, biking, jogging, and swimming are all good aerobic activities. These take light to moderate effort. Adding some light resistance training is also important. Even simple lifestyle changes can help. These lifestyle changes may include parking farther from the store or taking the stairs instead of the elevator. At first, you may begin exercising under supervision, such as at a hospital or clinic. Over time, you may begin exercising at home, with your health care provider's approval. Types of exercise Aerobic exercise  During cardiac rehabilitation, it is important to do aerobic activities. Aerobic exercise keeps joints and muscles moving. It involves large muscle  groups. It is also rhythmic and must be done for a longer period of time. Doing these exercises improves circulation and endurance. Examples of aerobic exercise include:  Swimming.  Walking.  Hiking.  Jogging.  Cross-country skiing.  Biking.  Dancing. Static exercise  Static exercise (isometric exercise) uses muscles at high intensities without moving the joints. Some examples of static exercise include pushing against a heavy couch that does not move, doing a wall sit, or holding a plank position. Static exercise improves strength but also quickly increases blood pressure. Follow these guidelines:  If you have circulation problems or high blood pressure, talk with your health care provider before starting any static exercise routines. Do not do static exercises if your health care provider tells you not to.  Do not hold your breath while doing static exercises. Holding your breath during static exercises can raise your blood pressure to a dangerously high level. Weight-resistance exercise  Weight-resistance exercises are another important part of rehabilitation. These exercises strengthen your muscles by making them work against resistance. Resistance exercises may help you return to activities of daily living sooner and improve your quality of life. They also help reduce cardiac risk factors. Examples of weight-resistance exercise include using:  Free weights.  Weight-lifting machines.  Large, specially designed rubber bands. You will usually do weight-resistance exercises 2 times a week with a 2-day rest period between workouts. Stretching Stretching before you exercise warms up your muscles and prevents injury. Stretching also improves your flexibility, balance, coordination, and range of motion. Follow these guidelines:  Stretch both before and after exercising.  Do not force a muscle or joint into a painful angle. Stretching should be a relaxing part  of your exercise  routine.  Once you feel resistance in your muscle, hold the stretch for a few seconds. Make sure you keep breathing while you hold the stretch.  Go slowly when doing all stretches. Setting a pace  Choose a pace that is comfortable for you.  You should be able to talk while exercising. If you are short of breath or unable to speak while you exercise, slow down.  If you are able to sing while exercising, you are not exercising hard enough.  Keep track of how hard you are working as you exercise (exertion level). Your rehabilitation therapist can teach you to use a mental scale to measure your level of exertion (perceived exertion). Using a mental scale, you will think about your exertion level and rate it in a range from 6 to 20.  A rating of 6 to 9. This means that you are doing "very light" exercise and are not exerting yourself enough. For a healthy person, this may be walking at a slow pace.  A rating of 11 to 15. This is exercise that is "somewhat hard." For a healthy exercise session, you should aim for an exertion rate that is within this range.  A rating of 16 to 17. This is considered "very hard" or strenuous. For a healthy person, exercise at this rating may start to feel heavy and difficult.  A rating of 19 to 20. This means that you are working "extremely hard." For most people, these numbers represent the hardest you've ever worked to exercise.  Your health care provider or cardiac rehabilitation specialist may also recommend that you wear a heart rate monitor while you exercise. This will help you keep track of your heart rate zones and how hard your heart is working. Frequency As you are recovering, it is important to start exercising slowly and to gradually work up to your goal. Work with your health care provider to set up an exercise routine that works for you. Generally, cardiac rehabilitation exercise should include:  40 minutes of aerobic activity 3 - 4 days a  week.  Stretching and strength exercises 2 - 3 days a week. Contact a doctor if:  You have any of the following symptoms while exercising:  Pain, pressure, or burning in your chest, jaw, shoulder, or back (angina).  Lightheadedness.  Dizziness.  Irregular or fast heartbeat.  Shortness of breath.  You are extremely tired after exercising. Get help right away if:  You have angina that does not get better with medicine and lasts for more than 5 minutes.  You have nausea or you vomit.  You have excessive sweating that is not caused by exercise. Summary  When you are recovering from a heart condition, it is important to have heart-healthy habits, including exercise routines.  At first, you may begin exercising under supervision, such as at a hospital or clinic. Over time, you may begin exercising at home, with your health care provider's approval.  Aim for 40 minutes of aerobic exercises 3 - 4 days a week.  Aim to do stretching and strength exercises 2 - 3 days a week.  Choose a pace that is comfortable for you. You should be able to talk while exercising. This information is not intended to replace advice given to you by your health care provider. Make sure you discuss any questions you have with your health care provider. Document Released: 08/03/2013 Document Revised: 06/28/2016 Document Reviewed: 06/28/2016 Elsevier Interactive Patient Education  2017 [employer].

## 2016-12-17 ENCOUNTER — Encounter (HOSPITAL_COMMUNITY): Payer: Self-pay | Admitting: General Practice

## 2016-12-17 LAB — COMPREHENSIVE METABOLIC PANEL
ALBUMIN: 4.5 g/dL (ref 3.5–5.5)
ALT: 17 IU/L (ref 0–44)
AST: 16 IU/L (ref 0–40)
Albumin/Globulin Ratio: 1.7 (ref 1.2–2.2)
Alkaline Phosphatase: 61 IU/L (ref 39–117)
BUN / CREAT RATIO: 11 (ref 9–20)
BUN: 15 mg/dL (ref 6–20)
Bilirubin Total: 1.7 mg/dL — ABNORMAL HIGH (ref 0.0–1.2)
CALCIUM: 9.1 mg/dL (ref 8.7–10.2)
CO2: 25 mmol/L (ref 18–29)
CREATININE: 1.38 mg/dL — AB (ref 0.76–1.27)
Chloride: 98 mmol/L (ref 96–106)
GFR, EST AFRICAN AMERICAN: 75 mL/min/{1.73_m2} (ref >59)
GFR, EST NON AFRICAN AMERICAN: 65 mL/min/{1.73_m2} (ref >59)
GLOBULIN, TOTAL: 2.6 g/dL (ref 1.5–4.5)
GLUCOSE: 105 mg/dL — AB (ref 65–99)
Potassium: 3.9 mmol/L (ref 3.5–5.2)
SODIUM: 142 mmol/L (ref 134–144)
TOTAL PROTEIN: 7.1 g/dL (ref 6.0–8.5)

## 2016-12-17 LAB — LIPID PANEL
CHOLESTEROL TOTAL: 155 mg/dL (ref 100–199)
Chol/HDL Ratio: 3.6 ratio (ref 0.0–5.0)
HDL: 43 mg/dL (ref >39)
LDL Calculated: 88 mg/dL (ref 0–99)
Triglycerides: 121 mg/dL (ref 0–149)
VLDL Cholesterol Cal: 24 mg/dL (ref 5–40)

## 2016-12-17 NOTE — Progress Notes (Signed)
*  Final Notice* I have mailed letter with information about the Cardiac Rehab program. Patient has been called 2X. If patient does not responded to letter, referral will be closed.

## 2016-12-20 ENCOUNTER — Other Ambulatory Visit (HOSPITAL_COMMUNITY): Payer: Self-pay

## 2016-12-20 NOTE — Progress Notes (Signed)
Paramedicine Encounter    Patient ID: Mark Baker, male    DOB: 1981-04-25, 36 y.o.   MRN: [id number]   Patient Care Team: Patient, No Pcp Per as PCP - General (General Practice)  Patient Active Problem List   Diagnosis Date Noted  . Encounter for therapeutic drug monitoring 11/19/2016  . New onset atrial fibrillation (HCC)   . Acute on chronic systolic heart failure (HCC) 10/31/2016  . Congestive heart failure (CHF) (HCC) 07/05/2016  . Morbid obesity with body mass index (BMI) of 60.0 to 69.9 in adult [facility]) 07/05/2016  . CKD (chronic kidney disease) stage 2, GFR 60-89 ml/min 07/05/2016  . Chronic systolic heart failure (HCC) 06/03/2014  . Hyperlipidemia 11/25/2012  . Prolonged QT interval 11/24/2012  . Healthcare maintenance 09/03/2012  . HTN (hypertension) 02/19/2012  . Sleep apnea 02/10/2012  . Morbid obesity (HCC) 02/10/2012    Current Outpatient Prescriptions:  .  amiodarone (PACERONE) 200 MG tablet, Take 1 tablet (200 mg total) by mouth 2 (two) times daily., Disp: 60 tablet, Rfl: 3 .  atorvastatin (LIPITOR) 40 MG tablet, Take 1 tablet (40 mg total) by mouth daily., Disp: 30 tablet, Rfl: 3 .  carvedilol (COREG) 3.125 MG tablet, Take 1 tablet (3.125 mg total) by mouth 2 (two) times daily., Disp: 60 tablet, Rfl: 3 .  digoxin (LANOXIN) 0.25 MG tablet, Take 1 tablet (0.25 mg total) by mouth daily., Disp: 30 tablet, Rfl: 6 .  hydrALAZINE (APRESOLINE) 50 MG tablet, Take 1 tablet (50 mg total) by mouth every 8 (eight) hours., Disp: 90 tablet, Rfl: 6 .  isosorbide mononitrate (IMDUR) 60 MG 24 hr tablet, Take 1 tablet (60 mg total) by mouth daily., Disp: 30 tablet, Rfl: 6 .  sildenafil (VIAGRA) 100 MG tablet, Take 1 tablet (100 mg total) by mouth daily as needed for erectile dysfunction., Disp: 5 tablet, Rfl: 3 .  spironolactone (ALDACTONE) 25 MG tablet, Take 1 tablet (25 mg total) by mouth daily., Disp: 30 tablet, Rfl: 3 .  torsemide (DEMADEX) 20 MG tablet, Take 40 mg (2 Tablets) in  the AM and 20 mg (1 Tablet) in the PM, Disp: 90 tablet, Rfl: 6 .  warfarin (COUMADIN) 7.5 MG tablet, Take 1 tablet (7.5 mg total) by mouth daily at 6 PM., Disp: 45 tablet, Rfl: 6 Allergies  Allergen Reactions  . Aspirin Hives, Itching and Rash     Social History   Social History  . Marital status: Married    Spouse name: N/A  . Number of children: N/A  . Years of education: N/A   Occupational History  . Not on file.   Social History Main Topics  . Smoking status: Never Smoker  . Smokeless tobacco: Never Used  . Alcohol use 0.0 oz/week     Comment: seldom.  . Drug use: No  . 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    Other Topics Concern  . Not on file   Social History Narrative   Lives in [place] with wife of 4 years.  Works for [employer] at [employer]  1 son and 2 daughters.     Denies cigarettes. Drank 1 beer last night 11/22/12. Denies drugs           Physical Exam  Constitutional: He is oriented to person, place, and time. He appears well-developed.  Neck: Normal range of motion.  Cardiovascular: Normal rate and regular rhythm.   Pulmonary/Chest: Breath sounds normal. No respiratory distress. He  has no wheezes. He has no rales.  Abdominal: Soft. He exhibits no distension.  Musculoskeletal: Normal range of motion. He exhibits no edema.  Neurological: He is alert and oriented to person, place, and time.  Skin: Skin is warm and dry.        SAFE - 11/29/16 1400      Situation   Admitting diagnosis CHF   Heart failure history New   Comorbidities Atrial fibillation;HTN;Sleep Apnea   Readmitted within 30 days No   Hospital admission within past 12 months Yes   number of hospital admissions 1   number of ED visits 2   Target Weight 477 lbs     Assessment   Lives alone No   Primary support person Wife    Mode of transportation personal car   Other services involved None   Home equipement Scale     Weight   Weighs self  daily No   Scale provided No   Records on weight chart No     Resources   Has "Living better w/heart failure" book Yes   Has HF Zone tool Yes   Able to identify yellow zone signs/when to call MD Yes   Records zone daily Yes     Medications   Uses a pill box Yes   Who stocks the pill box self   Pill box checked this visit Yes   Pill box refilled this visit No   Difficulty obtaining medications No   Mail order medications No   Missed one or more doses of medications per week No     Nutrition   Patient receives meals on wheels No   Patient follows low sodium diet Yes   Has foods at home that meet the current recommended diet Yes   Patient follows low sugar/card diet No   Nutritional concerns/issues None     Activity Level   ADL's/Mobility Independent   How many feet can patient ambulate .25 mile   Typical activity level Moderate   Barriers Child care   Actvity tolerance: NYHA Class 1     Urine   Difficulty urinating No   Changes in urine None     Time spent with patient   Time spent with patient  40 Minutes        Future Appointments Date Time Provider Department Center  12/25/2016 11:00 AM [facility] PA/NP [facility] None  12/30/2016 10:35 AM [facility]  01/09/2017 8:00 PM [facility] 5 [facility]  06/19/2017 9:00 AM Sherren Mocha, MD PCP-PCP [facility]   BP 130/84 (BP Location: Right Arm, Patient Position: Sitting, Cuff Size: Large)   Pulse 84   Resp 16   Wt (!) 480 lb 3.2 oz (217.8 kg)   SpO2 92%   BMI 61.96 kg/m  Weight yesterday- 481 lbs Last visit weight- 482 lbs   Mark Baker was seen at home today and reports feeling well. He is filling his own pillbox and knows all of his medications and doses without looking at the bottles. He asked for compression socks which I relayed to Daphne at the clinic and she advised she would get a prescription for him. He continues to weigh daily and is taking his medications as prescribed. He  denied SOB, CP, H/A or dizziness. With his ability to oversee his own medications and means to obtain them I believe it would be acceptable to decrease visits to every other week. I will bring this up to the clinic staff  next week.   Jacqualine Code, EMT 12/20/16  ACTION: Home visit completed Next visit planned for 1 week

## 2016-12-23 ENCOUNTER — Other Ambulatory Visit (HOSPITAL_COMMUNITY): Payer: Self-pay

## 2016-12-23 NOTE — Progress Notes (Signed)
Paramedicine Encounter    Patient ID: Tavius Turgeon, male    DOB: 1981/06/23, 36 y.o.   MRN: [id number]   Patient Care Team: Patient, No Pcp Per as PCP - General (General Practice)  Patient Active Problem List   Diagnosis Date Noted  . Encounter for therapeutic drug monitoring 11/19/2016  . New onset atrial fibrillation (HCC)   . Acute on chronic systolic heart failure (HCC) 10/31/2016  . Congestive heart failure (CHF) (HCC) 07/05/2016  . Morbid obesity with body mass index (BMI) of 60.0 to 69.9 in adult [facility]) 07/05/2016  . CKD (chronic kidney disease) stage 2, GFR 60-89 ml/min 07/05/2016  . Chronic systolic heart failure (HCC) 06/03/2014  . Hyperlipidemia 11/25/2012  . Prolonged QT interval 11/24/2012  . Healthcare maintenance 09/03/2012  . HTN (hypertension) 02/19/2012  . Sleep apnea 02/10/2012  . Morbid obesity (HCC) 02/10/2012    Current Outpatient Prescriptions:  .  amiodarone (PACERONE) 200 MG tablet, Take 1 tablet (200 mg total) by mouth 2 (two) times daily., Disp: 60 tablet, Rfl: 3 .  atorvastatin (LIPITOR) 40 MG tablet, Take 1 tablet (40 mg total) by mouth daily., Disp: 30 tablet, Rfl: 3 .  carvedilol (COREG) 3.125 MG tablet, Take 1 tablet (3.125 mg total) by mouth 2 (two) times daily., Disp: 60 tablet, Rfl: 3 .  digoxin (LANOXIN) 0.25 MG tablet, Take 1 tablet (0.25 mg total) by mouth daily., Disp: 30 tablet, Rfl: 6 .  hydrALAZINE (APRESOLINE) 50 MG tablet, Take 1 tablet (50 mg total) by mouth every 8 (eight) hours., Disp: 90 tablet, Rfl: 6 .  isosorbide mononitrate (IMDUR) 60 MG 24 hr tablet, Take 1 tablet (60 mg total) by mouth daily., Disp: 30 tablet, Rfl: 6 .  sildenafil (VIAGRA) 100 MG tablet, Take 1 tablet (100 mg total) by mouth daily as needed for erectile dysfunction., Disp: 5 tablet, Rfl: 3 .  spironolactone (ALDACTONE) 25 MG tablet, Take 1 tablet (25 mg total) by mouth daily., Disp: 30 tablet, Rfl: 3 .  torsemide (DEMADEX) 20 MG tablet, Take 40 mg (2 Tablets) in  the AM and 20 mg (1 Tablet) in the PM, Disp: 90 tablet, Rfl: 6 .  warfarin (COUMADIN) 7.5 MG tablet, Take 1 tablet (7.5 mg total) by mouth daily at 6 PM., Disp: 45 tablet, Rfl: 6 Allergies  Allergen Reactions  . Aspirin Hives, Itching and Rash     Social History   Social History  . Marital status: Married    Spouse name: N/A  . Number of children: N/A  . Years of education: N/A   Occupational History  . Not on file.   Social History Main Topics  . Smoking status: Never Smoker  . Smokeless tobacco: Never Used  . Alcohol use 0.0 oz/week     Comment: seldom.  . Drug use: No  . 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    Other Topics Concern  . Not on file   Social History Narrative   Lives in [place] with wife of 4 years.  Works for [employer] at [employer] [facility] [employer].  1 son and 2 daughters.     Denies cigarettes. Drank 1 beer last night 11/22/12. Denies drugs           Physical Exam  Neck: Normal range of motion.  Cardiovascular: Normal rate and regular rhythm.   Pulmonary/Chest: Effort normal and breath sounds normal. No respiratory distress. He has no wheezes. He has no rales.  Abdominal: Soft.  Bowel sounds are normal.  Musculoskeletal: Normal range of motion. He exhibits no edema.  Skin: Skin is warm and dry.        SAFE - 11/29/16 1400      Situation   Admitting diagnosis CHF   Heart failure history New   Comorbidities Atrial fibillation;HTN;Sleep Apnea   Readmitted within 30 days No   Hospital admission within past 12 months Yes   number of hospital admissions 1   number of ED visits 2   Target Weight 477 lbs     Assessment   Lives alone No   Primary support person Wife    Mode of transportation personal car   Other services involved None   Home equipement Scale     Weight   Weighs self daily No   Scale provided No   Records on weight chart No     Resources   Has "Living better w/heart failure" book Yes   Has HF Zone  tool Yes   Able to identify yellow zone signs/when to call MD Yes   Records zone daily Yes     Medications   Uses a pill box Yes   Who stocks the pill box self   Pill box checked this visit Yes   Pill box refilled this visit No   Difficulty obtaining medications No   Mail order medications No   Missed one or more doses of medications per week No     Nutrition   Patient receives meals on wheels No   Patient follows low sodium diet Yes   Has foods at home that meet the current recommended diet Yes   Patient follows low sugar/card diet No   Nutritional concerns/issues None     Activity Level   ADL's/Mobility Independent   How many feet can patient ambulate .25 mile   Typical activity level Moderate   Barriers Child care   Actvity tolerance: NYHA Class 1     Urine   Difficulty urinating No   Changes in urine None     Time spent with patient   Time spent with patient  40 Minutes        Future Appointments Date Time Provider Department Center  12/25/2016 11:00 AM [facility] PA/NP [facility] None  12/30/2016 10:35 AM [facility]  01/09/2017 8:00 PM [facility] 5 [facility]  06/19/2017 9:00 AM Sherren Mocha, MD PCP-PCP [facility]   BP 130/80 (BP Location: Right Arm, Patient Position: Sitting, Cuff Size: Large)   Pulse 80   Resp 16   Wt (!) 478 lb (216.8 kg)   SpO2 97%   BMI 61.67 kg/m  Weight yesterday- 479 lbs Last visit weight- 480 lbs  Mr. Kulzer was seen at home today and reports feeling a burning sensation in his chest that he described as "heartburn-like." He also was complaining of a H/A. Vital signs were within normal limits and he said the pain has been intermittent since 03:00. He was in a normal sinus rhythm without any ECG changes. The clinic was contacted and Amy advised that if Mr. Snethen felt like it was necessary, he should go to the ED. Mr. Hlavacek stated he believed it was just heartburn but wanted paramedicine to come out to  be safe. He reports compliance with his medications and said he was going to take some antacids and see how her felt. He was advised to contact me of 911 if he did not feel relief.   Alveda Reasons  Laurita Quint, EMT 12/23/16  ACTION: Home visit completed Next visit planned for 4 days

## 2016-12-25 ENCOUNTER — Encounter (HOSPITAL_COMMUNITY): Payer: Self-pay

## 2016-12-25 ENCOUNTER — Ambulatory Visit (HOSPITAL_COMMUNITY)
Admission: RE | Admit: 2016-12-25 | Discharge: 2016-12-25 | Disposition: A | Discharge status: Home / Self Care | Payer: Managed Care, Other (non HMO) | Source: Ambulatory Visit | Attending: Internal Medicine | Admitting: Internal Medicine

## 2016-12-25 VITALS — BP 144/88 | HR 87 | Wt >= 6400 oz

## 2016-12-25 DIAGNOSIS — E669 Obesity, unspecified: Secondary | ICD-10-CM | POA: Insufficient documentation

## 2016-12-25 DIAGNOSIS — E7800 Pure hypercholesterolemia, unspecified: Secondary | ICD-10-CM | POA: Diagnosis not present

## 2016-12-25 DIAGNOSIS — I10 Essential (primary) hypertension: Secondary | ICD-10-CM | POA: Diagnosis not present

## 2016-12-25 DIAGNOSIS — Z79899 Other long term (current) drug therapy: Secondary | ICD-10-CM | POA: Insufficient documentation

## 2016-12-25 DIAGNOSIS — I5042 Chronic combined systolic (congestive) and diastolic (congestive) heart failure: Secondary | ICD-10-CM | POA: Diagnosis not present

## 2016-12-25 DIAGNOSIS — I5022 Chronic systolic (congestive) heart failure: Secondary | ICD-10-CM | POA: Diagnosis not present

## 2016-12-25 DIAGNOSIS — N529 Male erectile dysfunction, unspecified: Secondary | ICD-10-CM | POA: Diagnosis not present

## 2016-12-25 DIAGNOSIS — I130 Hypertensive heart and chronic kidney disease with heart failure and stage 1 through stage 4 chronic kidney disease, or unspecified chronic kidney disease: Secondary | ICD-10-CM | POA: Insufficient documentation

## 2016-12-25 DIAGNOSIS — Z8249 Family history of ischemic heart disease and other diseases of the circulatory system: Secondary | ICD-10-CM | POA: Insufficient documentation

## 2016-12-25 DIAGNOSIS — Z7901 Long term (current) use of anticoagulants: Secondary | ICD-10-CM | POA: Insufficient documentation

## 2016-12-25 DIAGNOSIS — Z6841 Body Mass Index (BMI) 40.0 and over, adult: Secondary | ICD-10-CM | POA: Insufficient documentation

## 2016-12-25 DIAGNOSIS — I428 Other cardiomyopathies: Secondary | ICD-10-CM | POA: Insufficient documentation

## 2016-12-25 DIAGNOSIS — I480 Paroxysmal atrial fibrillation: Secondary | ICD-10-CM | POA: Insufficient documentation

## 2016-12-25 DIAGNOSIS — N183 Chronic kidney disease, stage 3 (moderate): Secondary | ICD-10-CM | POA: Insufficient documentation

## 2016-12-25 DIAGNOSIS — G4733 Obstructive sleep apnea (adult) (pediatric): Secondary | ICD-10-CM | POA: Insufficient documentation

## 2016-12-25 DIAGNOSIS — Z886 Allergy status to analgesic agent status: Secondary | ICD-10-CM | POA: Insufficient documentation

## 2016-12-25 MED ORDER — LOSARTAN POTASSIUM 25 MG PO TABS: 12.5000 mg | ORAL_TABLET | Freq: Every day | ORAL | 3 refills | Status: DC

## 2016-12-25 NOTE — Progress Notes (Signed)
Advanced Heart Failure Clinic Note    Primary Care: No PCP  Primary Cardiologist: Dr. Gala Romney   HPI: [place] is a 36 y.o. male with a past medical history of NICM, chronic combined systolic and diastolic CHF (EF 09-81%), OSA, obesity, atrial fibrillation, and medical noncompliance.   Admitted 10/31/16-11/14/16 with atrial fibrillation RVR and low output (co ox 38%). Started on Amiodarone and milrinone. TEE on 11/07/16 - EF 20% RV severely HK Massive LA (7.7cm), Severely dilated RA, Normal AV, Severe central MR, Moderate TR (RVSP 65-70), Trivial PR.  He spontaneously converted to NSR. He diuresed 44 pounds, discharge weight was 477 pounds. Discharged on torsemide 40mg  daily. Renal function inhibited starting ARB/ARNI.   He presents today for regular follow up. Had some chest pain that he contributed to heartburn after eating cooked cabbage. Weight at home 479. Going to gym several times a week. Using viagra once or twice a month. Drinking between 2 - 2.5 L, not adding any salt and trying to limit salt. Denies any SOB, including with exercise. Went to the gym last week and walked nearly a mile without any difficulty. Taking all medications as directed. He is not taking imdur on days he takes viagra, but states he also holds hydralazine on that day.   Past Medical History:  Diagnosis Date  . Asthma    childhood asthma no exacerbation since age 66   . Chronic combined systolic and diastolic CHF (congestive heart failure) (HCC)    a) ECHO (11/2012): EF 40-45%, RV fx difficult to see, nl size b) ECHO (05/2014): EF 20-25%, grade 2 DD, RV mildly dilated and sys fx mod reduced  . Hypertension   . NICM (nonischemic cardiomyopathy) (HCC)    a) (11/2012): normal coronaries  . OSA (obstructive sleep apnea)     Current Outpatient Prescriptions  Medication Sig Dispense Refill  . amiodarone (PACERONE) 200 MG tablet Take 1 tablet (200 mg total) by mouth 2 (two) times daily. 60 tablet 3  . atorvastatin  (LIPITOR) 40 MG tablet Take 1 tablet (40 mg total) by mouth daily. 30 tablet 3  . carvedilol (COREG) 3.125 MG tablet Take 1 tablet (3.125 mg total) by mouth 2 (two) times daily. 60 tablet 3  . digoxin (LANOXIN) 0.25 MG tablet Take 1 tablet (0.25 mg total) by mouth daily. 30 tablet 6  . hydrALAZINE (APRESOLINE) 50 MG tablet Take 1 tablet (50 mg total) by mouth every 8 (eight) hours. 90 tablet 6  . isosorbide mononitrate (IMDUR) 60 MG 24 hr tablet Take 1 tablet (60 mg total) by mouth daily. 30 tablet 6  . sildenafil (VIAGRA) 100 MG tablet Take 1 tablet (100 mg total) by mouth daily as needed for erectile dysfunction. 5 tablet 3  . spironolactone (ALDACTONE) 25 MG tablet Take 1 tablet (25 mg total) by mouth daily. 30 tablet 3  . torsemide (DEMADEX) 20 MG tablet Take 40 mg (2 Tablets) in the AM and 20 mg (1 Tablet) in the PM 90 tablet 6  . warfarin (COUMADIN) 7.5 MG tablet Take 1 tablet (7.5 mg total) by mouth daily at 6 PM. 45 tablet 6   No current facility-administered medications for this encounter.     Allergies  Allergen Reactions  . Aspirin Hives, Itching and Rash      Social History   Social History  . Marital status: Married    Spouse name: N/A  . Number of children: N/A  . Years of education: N/A   Occupational History  .  Not on file.   Social History Main Topics  . Smoking status: Never Smoker  . Smokeless tobacco: Never Used  . Alcohol use 0.0 oz/week     Comment: seldom.  . Drug use: No  . 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    Other Topics Concern  . Not on file   Social History Narrative   Lives in [place] with wife of 4 years.  Works for [employer] at [employer].  1 son and 2 daughters.     Denies cigarettes. Drank 1 beer last night 11/22/12. Denies drugs             Family History  Problem Relation Age of Onset  . Hypertension Father   . Heart failure Father        paternal side of family   . Hypertension Sister     . Stroke Maternal Grandmother   . Hypertension Unknown        maternal side of family     Vitals:   12/25/16 1057  BP: (!) 144/88  Pulse: 87  SpO2: 95%  Weight: (!) 483 lb 2 oz (219.1 kg)   Wt Readings from Last 3 Encounters:  12/25/16 (!) 483 lb 2 oz (219.1 kg)  12/23/16 (!) 478 lb (216.8 kg)  12/20/16 (!) 480 lb 3.2 oz (217.8 kg)    PHYSICAL EXAM: General: Obese male, NAD. Walked into clinic with difficulty.  HEENT: Normal Neck: supple. JVP difficult to assess. Carotids 2+ bilat; no bruits. No thyromegaly or nodule noted. Cor: PMI nondisplaced. RRR, No M/G/R noted Lungs: CTAB, normal effort. Abdomen: soft, non-tender, distended, no HSM. No bruits or masses. +BS  Extremities: no cyanosis, clubbing, or rash. Trace to 1+ pedal edema.  Neuro: alert & orientedx3, cranial nerves grossly intact. moves all 4 extremities w/o difficulty. Affect pleasant   ASSESSMENT & PLAN: 1. Chronic combined systolic and diastolic CHF: 10/2016 EF 20-25% NICM, likely tachymediated although noncompliance may play a role.  - NYHA II. Volume status stable on exam.  - Continue torsemide 40 mg q am and 20 mg q pm.  - Continue digoxin 0.70mcg. Level ok in April 2018.  - Continue hydralazine 50 mg TID.  - Continue imdur 60 mg daily. ( Will not increase with viagra use, knows to avoid on days he takes) - Continue Spiro 25 mg daily - Continue Coreg 3.125mg  BID, patient refused increase in Coreg due to erectile dysfunction. - Refused to re-challenge Entresto.  Will start on Losartan 12.5 mg qhs.  - Will need Echo to reassess EF in June. Will plan for next visit.  2. Obesity: - Encouraged to limit portion size and increase activity.  3. OSA - Continue CPAP nightly.  4. CKD stage III - Creatinine stable. BMET in 2 weeks with losartan   5. PAF - Continue Amiodarone 200 mg BID. Consider decrease at next visit.  - Rate controlled on exam.  - This patients CHA2DS2-VASc Score and unadjusted Ischemic Stroke  Rate (% per year) is equal to 2.2 % stroke rate/year from a score of 2 Above score calculated as 1 point each if present [CHF, HTN, DM, Vascular=MI/PAD/Aortic Plaque, Age if 65-74, or Male], 2 points each if present [Age > 75, or Stroke/TIA/TE] - Continue coumadin for anticoagulation. INR per PCP.  6. Erectile dysfunction - Continue Viagra as needed. Pt aware, re-educated, and repeated back potential for interaction with imdur.   Meds and labs as  above. RTC 6-8 weeks with Echo for return to work consideration.   Graciella Freer, PA-C 12/25/16   Greater than 50% of the 25 minute visit was spent in counseling/coordination of care regarding disease state education, salt/fluid restriction, activity restrictions, and goal directed medical therapy.

## 2016-12-25 NOTE — Patient Instructions (Signed)
START Losartan 12.5mg  (1/2 tablet) daily.  Lab work in 2 weeks (bmet)  Follow up and Echo with Dr.Bensimhon in 6-8 weeks.

## 2016-12-27 ENCOUNTER — Other Ambulatory Visit (HOSPITAL_COMMUNITY): Payer: Self-pay

## 2016-12-27 NOTE — Progress Notes (Signed)
Paramedicine Encounter    Patient ID: [place], male    DOB: 1980-12-18, 36 y.o.   MRN: [id number]   Patient Care Team: Patient, No Pcp Per as PCP - General (General Practice)  Patient Active Problem List   Diagnosis Date Noted  . Erectile dysfunction 12/25/2016  . Encounter for therapeutic drug monitoring 11/19/2016  . Paroxysmal atrial fibrillation (HCC)   . Congestive heart failure (CHF) (HCC) 07/05/2016  . Morbid obesity with body mass index (BMI) of 60.0 to 69.9 in adult [facility]) 07/05/2016  . CKD (chronic kidney disease), stage III 07/05/2016  . Chronic systolic heart failure (HCC) 06/03/2014  . Hyperlipidemia 11/25/2012  . Prolonged QT interval 11/24/2012  . Healthcare maintenance 09/03/2012  . HTN (hypertension) 02/19/2012  . Sleep apnea 02/10/2012  . Morbid obesity (HCC) 02/10/2012    Current Outpatient Prescriptions:  .  amiodarone (PACERONE) 200 MG tablet, Take 1 tablet (200 mg total) by mouth 2 (two) times daily., Disp: 60 tablet, Rfl: 3 .  atorvastatin (LIPITOR) 40 MG tablet, Take 1 tablet (40 mg total) by mouth daily., Disp: 30 tablet, Rfl: 3 .  carvedilol (COREG) 3.125 MG tablet, Take 1 tablet (3.125 mg total) by mouth 2 (two) times daily., Disp: 60 tablet, Rfl: 3 .  digoxin (LANOXIN) 0.25 MG tablet, Take 1 tablet (0.25 mg total) by mouth daily., Disp: 30 tablet, Rfl: 6 .  hydrALAZINE (APRESOLINE) 50 MG tablet, Take 1 tablet (50 mg total) by mouth every 8 (eight) hours., Disp: 90 tablet, Rfl: 6 .  isosorbide mononitrate (IMDUR) 60 MG 24 hr tablet, Take 1 tablet (60 mg total) by mouth daily., Disp: 30 tablet, Rfl: 6 .  losartan (COZAAR) 25 MG tablet, Take 0.5 tablets (12.5 mg total) by mouth at bedtime., Disp: 15 tablet, Rfl: 3 .  sildenafil (VIAGRA) 100 MG tablet, Take 1 tablet (100 mg total) by mouth daily as needed for erectile dysfunction., Disp: 5 tablet, Rfl: 3 .  spironolactone (ALDACTONE) 25 MG tablet, Take 1 tablet (25 mg total) by mouth daily., Disp: 30  tablet, Rfl: 3 .  torsemide (DEMADEX) 20 MG tablet, Take 40 mg (2 Tablets) in the AM and 20 mg (1 Tablet) in the PM, Disp: 90 tablet, Rfl: 6 .  warfarin (COUMADIN) 7.5 MG tablet, Take 1 tablet (7.5 mg total) by mouth daily at 6 PM., Disp: 45 tablet, Rfl: 6 Allergies  Allergen Reactions  . Aspirin Hives, Itching and Rash     Social History   Social History  . Marital status: Married    Spouse name: N/A  . Number of children: N/A  . Years of education: N/A   Occupational History  . Not on file.   Social History Main Topics  . Smoking status: Never Smoker  . Smokeless tobacco: Never Used  . Alcohol use 0.0 oz/week     Comment: seldom.  . Drug use: No  . 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    Other Topics Concern  . Not on file   Social History Narrative   Lives in [place] with wife of 4 years.  Works for [employer] at [employer] [facility] [employer].  1 [place] and 2 daughters.     Denies cigarettes. Drank 1 beer last night 11/22/12. Denies drugs           Physical Exam  Constitutional: He is oriented to person, place, and time.  Neck: Normal range of motion.  Cardiovascular: Normal rate and regular rhythm.  Pulmonary/Chest: Effort normal and breath sounds normal. No respiratory distress. He has no wheezes. He has no rales.  Abdominal: Soft. He exhibits no distension.  Musculoskeletal: Normal range of motion. He exhibits no edema.  Neurological: He is alert and oriented to person, place, and time.  Skin: Skin is warm and dry.        SAFE - 11/29/16 1400      Situation   Admitting diagnosis CHF   Heart failure history New   Comorbidities Atrial fibillation;HTN;Sleep Apnea   Readmitted within 30 days No   Hospital admission within past 12 months Yes   number of hospital admissions 1   number of ED visits 2   Target Weight 477 lbs     Assessment   Lives alone No   Primary support person Wife    Mode of transportation personal car   Other  services involved None   Home equipement Scale     Weight   Weighs self daily No   Scale provided No   Records on weight chart No     Resources   Has "Living better w/heart failure" book Yes   Has HF Zone tool Yes   Able to identify yellow zone signs/when to call MD Yes   Records zone daily Yes     Medications   Uses a pill box Yes   Who stocks the pill box self   Pill box checked this visit Yes   Pill box refilled this visit No   Difficulty obtaining medications No   Mail order medications No   Missed one or more doses of medications per week No     Nutrition   Patient receives meals on wheels No   Patient follows low sodium diet Yes   Has foods at home that meet the current recommended diet Yes   Patient follows low sugar/card diet No   Nutritional concerns/issues None     Activity Level   ADL's/Mobility Independent   How many feet can patient ambulate .25 mile   Typical activity level Moderate   Barriers Child care   Actvity tolerance: NYHA Class 1     Urine   Difficulty urinating No   Changes in urine None     Time spent with patient   Time spent with patient  40 Minutes        Future Appointments Date Time Provider Department Center  12/30/2016 10:35 AM [facility]  01/08/2017 10:00 AM [facility] LAB [facility] None  01/09/2017 8:00 PM [facility] 5 [facility]  02/17/2017 11:00 AM [facility] ECHO 1-BUZZ [facility]  02/17/2017 12:00 PM Bensimhon, Bevelyn Buckles, MD [facility] None  06/19/2017 9:00 AM Sherren Mocha, MD PCP-PCP [facility]   BP (!) 150/92 (BP Location: Left Arm, Patient Position: Sitting, Cuff Size: Large)   Pulse 96   Resp 18   Wt (!) 480 lb 12.8 oz (218.1 kg)   SpO2 93%   BMI 62.03 kg/m  Weight yesterday- 480 lbs Last visit weight- 478 lbs  [name] was seen at home today and reports feeling well. He denied SOB, H/A, dizziness or orthopnea. He has not been able to get to the gym this week but says he has been trying to  stay active at home. Medications were verified. We discussed cutting his visits to once every two weeks since he is managing himself so well, though I advised him to call if he needed anything in the mean time.  Jacqualine Code, EMT 12/27/16  ACTION: Home visit completed Next visit planned for 2 weeks

## 2017-01-01 ENCOUNTER — Encounter (HOSPITAL_COMMUNITY): Payer: Self-pay | Admitting: *Deleted

## 2017-01-01 ENCOUNTER — Emergency Department (HOSPITAL_COMMUNITY): Payer: Managed Care, Other (non HMO)

## 2017-01-01 ENCOUNTER — Encounter (HOSPITAL_COMMUNITY): Payer: Self-pay | Admitting: Emergency Medicine

## 2017-01-01 ENCOUNTER — Observation Stay (HOSPITAL_COMMUNITY)
Admission: EM | Admit: 2017-01-01 | Discharge: 2017-01-02 | Disposition: A | Discharge status: Home / Self Care | Payer: Managed Care, Other (non HMO) | Attending: Internal Medicine | Admitting: Internal Medicine

## 2017-01-01 DIAGNOSIS — E785 Hyperlipidemia, unspecified: Secondary | ICD-10-CM | POA: Diagnosis present

## 2017-01-01 DIAGNOSIS — W19XXXA Unspecified fall, initial encounter: Secondary | ICD-10-CM | POA: Insufficient documentation

## 2017-01-01 DIAGNOSIS — R55 Syncope and collapse: Principal | ICD-10-CM | POA: Diagnosis present

## 2017-01-01 DIAGNOSIS — Z7901 Long term (current) use of anticoagulants: Secondary | ICD-10-CM | POA: Insufficient documentation

## 2017-01-01 DIAGNOSIS — N183 Chronic kidney disease, stage 3 unspecified: Secondary | ICD-10-CM | POA: Diagnosis present

## 2017-01-01 DIAGNOSIS — I10 Essential (primary) hypertension: Secondary | ICD-10-CM | POA: Diagnosis present

## 2017-01-01 DIAGNOSIS — R51 Headache: Secondary | ICD-10-CM | POA: Insufficient documentation

## 2017-01-01 DIAGNOSIS — N1832 Chronic kidney disease, stage 3b: Secondary | ICD-10-CM | POA: Diagnosis present

## 2017-01-01 DIAGNOSIS — Z823 Family history of stroke: Secondary | ICD-10-CM | POA: Insufficient documentation

## 2017-01-01 DIAGNOSIS — Z79899 Other long term (current) drug therapy: Secondary | ICD-10-CM | POA: Insufficient documentation

## 2017-01-01 DIAGNOSIS — I509 Heart failure, unspecified: Secondary | ICD-10-CM

## 2017-01-01 DIAGNOSIS — G4733 Obstructive sleep apnea (adult) (pediatric): Secondary | ICD-10-CM | POA: Insufficient documentation

## 2017-01-01 DIAGNOSIS — I5022 Chronic systolic (congestive) heart failure: Secondary | ICD-10-CM | POA: Diagnosis present

## 2017-01-01 DIAGNOSIS — I130 Hypertensive heart and chronic kidney disease with heart failure and stage 1 through stage 4 chronic kidney disease, or unspecified chronic kidney disease: Secondary | ICD-10-CM | POA: Insufficient documentation

## 2017-01-01 DIAGNOSIS — Z8249 Family history of ischemic heart disease and other diseases of the circulatory system: Secondary | ICD-10-CM | POA: Insufficient documentation

## 2017-01-01 DIAGNOSIS — I5042 Chronic combined systolic (congestive) and diastolic (congestive) heart failure: Secondary | ICD-10-CM | POA: Insufficient documentation

## 2017-01-01 DIAGNOSIS — Z6841 Body Mass Index (BMI) 40.0 and over, adult: Secondary | ICD-10-CM | POA: Insufficient documentation

## 2017-01-01 DIAGNOSIS — R9431 Abnormal electrocardiogram [ECG] [EKG]: Secondary | ICD-10-CM | POA: Diagnosis present

## 2017-01-01 DIAGNOSIS — Z886 Allergy status to analgesic agent status: Secondary | ICD-10-CM | POA: Insufficient documentation

## 2017-01-01 DIAGNOSIS — R42 Dizziness and giddiness: Secondary | ICD-10-CM | POA: Insufficient documentation

## 2017-01-01 DIAGNOSIS — I480 Paroxysmal atrial fibrillation: Secondary | ICD-10-CM | POA: Diagnosis present

## 2017-01-01 DIAGNOSIS — G4730 Sleep apnea, unspecified: Secondary | ICD-10-CM | POA: Diagnosis present

## 2017-01-01 LAB — BASIC METABOLIC PANEL
ANION GAP: 11 (ref 5–15)
BUN: 14 mg/dL (ref 6–20)
CO2: 27 mmol/L (ref 22–32)
Calcium: 8.9 mg/dL (ref 8.9–10.3)
Chloride: 101 mmol/L (ref 101–111)
Creatinine, Ser: 1.38 mg/dL — ABNORMAL HIGH (ref 0.61–1.24)
GFR calc Af Amer: >60 mL/min (ref >60)
GLUCOSE: 114 mg/dL — AB (ref 65–99)
POTASSIUM: 3.6 mmol/L (ref 3.5–5.1)
Sodium: 139 mmol/L (ref 135–145)

## 2017-01-01 LAB — CBC
HCT: 47.5 % (ref 39.0–52.0)
Hemoglobin: 14.8 g/dL (ref 13.0–17.0)
MCH: 28.6 pg (ref 26.0–34.0)
MCHC: 31.2 g/dL (ref 30.0–36.0)
MCV: 91.7 fL (ref 78.0–100.0)
PLATELETS: 229 10*3/uL (ref 150–400)
RBC: 5.18 MIL/uL (ref 4.22–5.81)
RDW: 13.5 % (ref 11.5–15.5)
WBC: 7.6 10*3/uL (ref 4.0–10.5)

## 2017-01-01 LAB — I-STAT TROPONIN, ED
Troponin i, poc: 0.05 ng/mL (ref 0.00–0.08)
Troponin i, poc: 0.05 ng/mL (ref 0.00–0.08)

## 2017-01-01 LAB — PROTIME-INR
INR: 1.91
Prothrombin Time: 22.2 seconds — ABNORMAL HIGH (ref 11.4–15.2)

## 2017-01-01 LAB — CBG MONITORING, ED: GLUCOSE-CAPILLARY: 116 mg/dL — AB (ref 65–99)

## 2017-01-01 MED ORDER — AMIODARONE HCL 200 MG PO TABS
200.0000 mg | ORAL_TABLET | Freq: Two times a day (BID) | ORAL | Status: DC
  Administered 2017-01-01 – 2017-01-02 (×2): 200 mg via ORAL
  Filled 2017-01-01 (×2): qty 1

## 2017-01-01 MED ORDER — ACETAMINOPHEN 325 MG PO TABS
650.0000 mg | ORAL_TABLET | Freq: Once | ORAL | Status: AC
  Administered 2017-01-01: 650 mg via ORAL
  Filled 2017-01-01: qty 2

## 2017-01-01 MED ORDER — SODIUM CHLORIDE 0.9% FLUSH
3.0000 mL | Freq: Two times a day (BID) | INTRAVENOUS | Status: DC
  Administered 2017-01-02: 3 mL via INTRAVENOUS

## 2017-01-01 MED ORDER — TORSEMIDE 20 MG PO TABS
20.0000 mg | ORAL_TABLET | Freq: Every day | ORAL | Status: DC
  Administered 2017-01-01: 20 mg via ORAL
  Filled 2017-01-01: qty 1

## 2017-01-01 MED ORDER — DIGOXIN 125 MCG PO TABS
0.2500 mg | ORAL_TABLET | Freq: Every day | ORAL | Status: DC
  Administered 2017-01-02: 0.25 mg via ORAL
  Filled 2017-01-01: qty 2

## 2017-01-01 MED ORDER — HYDRALAZINE HCL 50 MG PO TABS
50.0000 mg | ORAL_TABLET | Freq: Three times a day (TID) | ORAL | Status: DC
  Administered 2017-01-01 – 2017-01-02 (×4): 50 mg via ORAL
  Filled 2017-01-01 (×4): qty 1

## 2017-01-01 MED ORDER — ISOSORBIDE MONONITRATE ER 60 MG PO TB24
60.0000 mg | ORAL_TABLET | Freq: Every day | ORAL | Status: DC
  Administered 2017-01-02: 60 mg via ORAL
  Filled 2017-01-01: qty 1

## 2017-01-01 MED ORDER — ATORVASTATIN CALCIUM 40 MG PO TABS
40.0000 mg | ORAL_TABLET | Freq: Every day | ORAL | Status: DC
  Administered 2017-01-02: 40 mg via ORAL
  Filled 2017-01-01: qty 1

## 2017-01-01 MED ORDER — WARFARIN SODIUM 7.5 MG PO TABS: 3.7500 mg | ORAL_TABLET | ORAL | Status: DC

## 2017-01-01 MED ORDER — WARFARIN SODIUM 7.5 MG PO TABS
7.5000 mg | ORAL_TABLET | ORAL | Status: DC
  Administered 2017-01-01: 7.5 mg via ORAL
  Filled 2017-01-01: qty 1

## 2017-01-01 MED ORDER — SPIRONOLACTONE 25 MG PO TABS
25.0000 mg | ORAL_TABLET | Freq: Every day | ORAL | Status: DC
Start: 2017-01-02 — End: 2017-01-02
  Administered 2017-01-02: 25 mg via ORAL
  Filled 2017-01-01: qty 1

## 2017-01-01 MED ORDER — ACETAMINOPHEN 650 MG RE SUPP: 650.0000 mg | Freq: Four times a day (QID) | RECTAL | Status: DC | PRN

## 2017-01-01 MED ORDER — POTASSIUM CHLORIDE CRYS ER 20 MEQ PO TBCR
40.0000 meq | EXTENDED_RELEASE_TABLET | Freq: Once | ORAL | Status: AC
  Administered 2017-01-01: 40 meq via ORAL
  Filled 2017-01-01: qty 2

## 2017-01-01 MED ORDER — LOSARTAN POTASSIUM 25 MG PO TABS
12.5000 mg | ORAL_TABLET | Freq: Every day | ORAL | Status: DC
  Administered 2017-01-01: 12.5 mg via ORAL
  Filled 2017-01-01: qty 1

## 2017-01-01 MED ORDER — CARVEDILOL 3.125 MG PO TABS
3.1250 mg | ORAL_TABLET | Freq: Two times a day (BID) | ORAL | Status: DC
  Administered 2017-01-02: 3.125 mg via ORAL
  Filled 2017-01-01: qty 1

## 2017-01-01 MED ORDER — FUROSEMIDE 10 MG/ML IJ SOLN
60.0000 mg | Freq: Once | INTRAMUSCULAR | Status: AC
  Administered 2017-01-01: 60 mg via INTRAVENOUS
  Filled 2017-01-01: qty 6

## 2017-01-01 MED ORDER — CARVEDILOL 3.125 MG PO TABS
3.1250 mg | ORAL_TABLET | Freq: Two times a day (BID) | ORAL | Status: DC
  Administered 2017-01-01: 3.125 mg via ORAL
  Filled 2017-01-01: qty 1

## 2017-01-01 MED ORDER — ACETAMINOPHEN 325 MG PO TABS: 650.0000 mg | ORAL_TABLET | Freq: Four times a day (QID) | ORAL | Status: DC | PRN

## 2017-01-01 MED ORDER — TORSEMIDE 20 MG PO TABS
40.0000 mg | ORAL_TABLET | Freq: Every day | ORAL | Status: DC
  Administered 2017-01-02: 40 mg via ORAL
  Filled 2017-01-01: qty 2

## 2017-01-01 MED ORDER — WARFARIN - PHYSICIAN DOSING INPATIENT: Freq: Every day | Status: DC

## 2017-01-01 NOTE — H&P (Signed)
Date: 01/01/2017               Patient Name:  Mark Baker MRN: [id number]  DOB: Oct 07, 1980 Age / Sex: 36 y.o., male   PCP: Patient, No Pcp Per              Medical Service: Internal Medicine Teaching Service              Attending Physician: Dr. Earl Lagos, MD    First Contact: Raoul Pitch, MS4 Pager: [phone]  Second Contact: Dr. Sheliah Hatch Pager: [phone]  Third Contact Dr. N/A Pager: [phone]       After Hours (After 5p/  First Contact Pager: [phone]  weekends / holidays): Second Contact Pager: [phone]   Chief Complaint:  Syncope  History of Present Illness: Mark Baker is a 36 year old male with a history of chronic combined systolic and diastolic CHF (EF 35-36%) secondary to non-ischemic cardiomyopathy, paroxysmal atrial fibrillation, morbid obesity, obstructive sleep apnea, CKD stage III, and HTN who presents to the ED following a syncopal episode. He states that earlier this morning, he had a heated argument with his wife, during which he began to feel "really worked up." After the argument, he went to his daughter's room and spoke to her for a few minutes. He then walked into the hallway and his daughter heard him fall to the floor. He remembers that prior to the episode, he had a pressure-like pain in the right side of his chest extending into his abdomen. He explains that this pain "felt like heartburn," was only on the right side of his chest, and did not radiate to either arm. He also remembers feeling lightheaded/dizzy prior to the episode, but he denies vertigo. He did not experience palpitations, dyspnea, vision changes, bowel/bladder incontinence, or diaphroesis. He does not remember having a headache prior to the episode, but thinks he may have hit his head during it because when he regained consciousness, he had a fairly severe headache. Following the episode, he did not feel confused, but he did experience mild photophobia. He is not sure how long he was  unconscious, but states that it is difficult for him to remember anything between talking with his daughter and arriving at the ED.  Mark Baker states that he has never experienced anything like this before. He feels that the episode was most likely the result of a panic attack, as he has been under significant stress over the past few weeks and especially this morning. He did not eat or drink anything this morning prior to the episode but states that he has had no recent changes to his diet or activity level. He was recently started on a new blood pressure medication, Losartan, last week. He states that he is compliant with all of his medications and that he did take his morning medications this morning.  Mark Baker was recently hospitalized from 10/31/16-11/14/16 with new onset atrial fibrillation. He did not experience syncope with the atrial fibrillation during this time. He was started on amiodarone and coumadin and spontaneously converted to normal sinus rhythm during his hospitalization.  Meds: Current Outpatient Prescriptions  Medication Sig Dispense Refill  . amiodarone (PACERONE) 200 MG tablet Take 1 tablet (200 mg total) by mouth 2 (two) times daily. 60 tablet 3  . atorvastatin (LIPITOR) 40 MG tablet Take 1 tablet (40 mg total) by mouth daily. 30 tablet 3  . carvedilol (COREG) 3.125 MG tablet Take 1 tablet (3.125 mg total) by mouth 2 (two) times  daily. 60 tablet 3  . digoxin (LANOXIN) 0.25 MG tablet Take 1 tablet (0.25 mg total) by mouth daily. 30 tablet 6  . hydrALAZINE (APRESOLINE) 50 MG tablet Take 1 tablet (50 mg total) by mouth every 8 (eight) hours. 90 tablet 6  . isosorbide mononitrate (IMDUR) 60 MG 24 hr tablet Take 1 tablet (60 mg total) by mouth daily. 30 tablet 6  . losartan (COZAAR) 25 MG tablet Take 0.5 tablets (12.5 mg total) by mouth at bedtime. 15 tablet 3  . sildenafil (VIAGRA) 100 MG tablet Take 1 tablet (100 mg total) by mouth daily as needed for erectile dysfunction. 5  tablet 3  . spironolactone (ALDACTONE) 25 MG tablet Take 1 tablet (25 mg total) by mouth daily. 30 tablet 3  . torsemide (DEMADEX) 20 MG tablet Take 40 mg (2 Tablets) in the AM and 20 mg (1 Tablet) in the PM (Patient taking differently: Take 20-40 mg by mouth 2 (two) times daily. Take 40 mg (2 Tablets) in the AM and 20 mg (1 Tablet) in the PM) 90 tablet 6  . warfarin (COUMADIN) 7.5 MG tablet Take 1 tablet (7.5 mg total) by mouth daily at 6 PM. (Patient taking differently: Take 3.75-7.5 mg by mouth daily at 6 PM. 1 tablet M/W/F and 1/2 tablet Tu/Th/Sat/Sun) 45 tablet 6    Allergies: Allergies as of 01/01/2017 - Review Complete 01/01/2017  Allergen Reaction Noted  . Aspirin Hives, Itching, and Rash 10/21/2012   Past Medical History:  Diagnosis Date  . Asthma    childhood asthma no exacerbation since age 42   . Chronic combined systolic and diastolic CHF (congestive heart failure) (HCC)    a) ECHO (11/2012): EF 40-45%, RV fx difficult to see, nl size b) ECHO (05/2014): EF 20-25%, grade 2 DD, RV mildly dilated and sys fx mod reduced  . Hypertension   . NICM (nonischemic cardiomyopathy) (HCC)    a) (11/2012): normal coronaries  . OSA (obstructive sleep apnea)    Past Surgical History:  Procedure Laterality Date  . ANTERIOR CRUCIATE LIGAMENT REPAIR     right  . CARDIOVERSION N/A 11/07/2016   Procedure: CARDIOVERSION;  Surgeon: Dolores Patty, MD;  Location[facility] OR;  Service: Cardiovascular;  Laterality: N/A;  Will need extra help positioning patient  . LEFT HEART CATHETERIZATION WITH CORONARY ANGIOGRAM N/A 11/24/2012   Procedure: LEFT HEART CATHETERIZATION WITH CORONARY ANGIOGRAM;  Surgeon: Peter M Swaziland, MD;  Location[facility] CATH LAB;  Service: Cardiovascular;  Laterality: N/A;  . TEE WITHOUT CARDIOVERSION N/A 11/07/2016   Procedure: TRANSESOPHAGEAL ECHOCARDIOGRAM (TEE);  Surgeon: Dolores Patty, MD;  Location: [facility] OR;  Service: Cardiovascular;  Laterality: N/A;   Family History  Problem  Relation Age of Onset  . Hypertension Father   . Heart failure Father        paternal side of family   . Hypertension Sister   . Stroke Maternal Grandmother   . Hypertension Unknown        maternal side of family   . Diabetes Neg Hx   . Hyperlipidemia Neg Hx    Social History  Mark Baker lives in [place] with his wife and three young daughters -- ages 13, 65, and 2. He is currently on short-term disability due to his recent hospitalization but works for [employer]. He is a never smoker and drinks alcohol very rarely. He denies any other recreational drug use.   Review of Systems: See HPI.  Physical Exam: Blood pressure 134/84, pulse 91, resp. rate  17, height 6\' 2"  (1.88 m), weight (!) 219.9 kg (484 lb 14.4 oz), SpO2 95 %. General: Obese, alert male sitting up and conversing in no acute distress. HEENT: Moist mucus membranes. Cardiac: Regular rate and rhythm. No murmurs, rubs, or gallops. Pulmonary: Normal work of breathing. Clear to auscultation bilaterally. Abdominal: Soft, non-distended. Normoactive bowel sounds. Extremities: Warm and dry. 2+ radial and dorsalis pedis pulses bilaterally. No peripheral edema. Eczema of shins bilaterally.  Lab results: Basic Metabolic Panel:  Recent Labs  82/95/62 1107  NA 139  K 3.6  CL 101  CO2 27  GLUCOSE 114*  BUN 14  CREATININE 1.38*  CALCIUM 8.9   CBC:  Recent Labs  01/01/17 1107  WBC 7.6  HGB 14.8  HCT 47.5  MCV 91.7  PLT 229   Coagulation:  Recent Labs  01/01/17 1107  LABPROT 22.2*  INR 1.91   Imaging results:  Ct Head Wo Contrast Result Date: 01/01/2017 IMPRESSION: 1. No acute findings. 2. Mild maxillary sinus mucosal thickening. 3. Probable partially empty sella turcica.   Dg Chest Portable 1 View Result Date: 01/01/2017 COMPARISON:  11/02/2016.  IMPRESSION: Congestive heart failure with bilateral interstitial edema.  Other results: EKG: Normal sinus rhythm.  Mark Baker is a 35 year old male with a  history of chronic combined systolic and diastolic CHF (EF 13-08%) and paroxysmal atrial fibrillation presenting following a syncopal episode in the setting of severe emotional stress.  Problem List: -- Syncope -- Chronic combined systolic and diastolic CHF -- HTN -- Paroxysmal atrial fibrillation -- Morbid obesity -- Hyperlipidemia -- Obstructive sleep apnea -- CKD stage III  Assessment and Plan By Problem: #Syncope Differential diagnosis for Mark Baker's syncopal episode is broad and includes vasovagal syncope, medication-induced, and cardiac arrhythmia (atrial fibrillation vs. ventricular arrhythmia). With the context of severe emotional stress, as well as the associated dizziness prior to the episode, benign vasovagal syncope appears to be most likely at this time. He took his morning medications this morning but did not eat or drink, and he is on multiple blood pressure medications and was recently started on Losartan, all of which could certainly contribute to syncope as a result of hypotension. Cardiac arrhythmia (atrial or ventricular) is a possibility given his chronic combined systolic and diastolic CHF and recent new onset paroxysmal atrial fibrillation, and we must carefully evaluate for this possibility as well. Seizure is also possible; this would be consistent with the fact that he does not remember the events between the syncopal episode and arriving at the hospital, but is not consistent with the lack of a post-ictal state and appears rather unlikely at this time. Electrolytes and CBC within normal limits. Troponins 0.05 x2. Head CT without acute intracranial abnormality; orthostatic vitals within normal limits. -- Telemetry -- Repeat EKG tomorrow a.m. -- Consider consulting heart failure in the morning regarding suggestions for further work-up  #Chronic combined systolic and diastolic CHF  #HTN EF 20-25% as of 10/2016. Thought to be due to non-ischemic cardiomyopathy. Managed by  Dr. Gala Romney with Advanced Heart Failure Team.  -- Continue home torsemide 40mg  every morning, 20mg  every evening -- Continue home digoxin 0.25mg  daily -- Continue home hydralazine 50mg  every 8 hours -- Continue home Imdur 60mg  daily -- Continue home spironolactone 25mg  daily -- Continue home carvedilol 3.125mg  twice daily -- Continue home Losartan 12.5mg  once daily  #Paroxysmal atrial fibrillation Recently hospitalization from 10/31/16-11/14/16 with new onset atrial fibrillation; started on amiodarone and coumadin and spontaneously converted to normal sinus rhythm. INR on  admission 1.91; will continue to monitor INR. EKG on admission in sinus rhythm. Will monitor on telemetry and repeat EKG tomorrow a.m. -- Telemetry -- Repeat EKG tomorrow a.m. -- Continue home amiodarone 200mg  twice daily -- Continue home warfarin 3.75mg  Sunday, Tuesday, Thursday, Saturday; 7.5mg  Monday, Wednesday, Friday -- INR tomorrow a.m.  #Morbid obesity #Hyperlipidemia Weight of 482 lbs; BMI of 62. Reports that he is on an exercise regimen and working to eat a healthy diet. -- Continue home atorvastatin 40mg  daily  #Obstructive sleep apnea On CPAP at home; reports that he tries to use it every night. -- Continue home CPAP nightly  #CKD stage III Baseline creatinine 1.3-1.5. Creatinine upon admission 1.38. Will monitor closely and avoid nephrotoxic medications. -- BMP tomorrow a.m.  DVT PPX: Home Warfarin FENGI: Heart Healthy Code: Full Code Dispo: Admitted to Internal Medicine Teaching Service for further management. Anticipate <2 midnight stay.  This is a Psychologist, occupational Note.  The care of the patient was discussed with Dr. Dimple Casey and the assessment and plan was formulated with their assistance.  Please see their note for official documentation of the patient encounter.   Signed: Raoul Pitch, Medical Student 01/01/2017, 3:35 PM

## 2017-01-01 NOTE — Progress Notes (Signed)
Patient refusing CPAP for tonight, made pt aware that if he changed his mind to call.

## 2017-01-01 NOTE — Progress Notes (Signed)
Received medical records request from [facility]  Requested records faxed today to [phone]. Claim # [id number].  Original request will be scanned to patient's electronic medical records.

## 2017-01-01 NOTE — ED Provider Notes (Signed)
[facility]-EMERGENCY DEPT Provider Note   CSN: [id number] Arrival date & time: 01/01/17  1026     History   Chief Complaint Chief Complaint  Patient presents with  . Loss of Consciousness    HPI Mark Baker is a 36 y.o. male.  HPI Patient presents to the emergency room for evaluation of a syncopal episode. Patient has a history of congestive heart failure, morbid obesity, nonischemic cardiomyopathy and obstructive sleep apnea. Patient states he was stressed and upset this morning. Did start to experience some intermittent chest discomfort. The pain would come and go lasting maybe 10 minutes at a time. It was not a pressure. The pain did not radiate. She did not feel diaphoretic or nauseated. Patient states he was having a discussion with his daughter. The next thing he remembers is people checking on him when he was on the ground. Patient does have a mild headache. No nausea or vomiting. No vomiting or diarrhea. He does not think he is any significant weight gain recently. No prior history of syncopal episode. Past Medical History:  Diagnosis Date  . Asthma    childhood asthma no exacerbation since age 56   . Chronic combined systolic and diastolic CHF (congestive heart failure) (HCC)    a) ECHO (11/2012): EF 40-45%, RV fx difficult to see, nl size b) ECHO (05/2014): EF 20-25%, grade 2 DD, RV mildly dilated and sys fx mod reduced  . Hypertension   . NICM (nonischemic cardiomyopathy) (HCC)    a) (11/2012): normal coronaries  . OSA (obstructive sleep apnea)     Patient Active Problem List   Diagnosis Date Noted  . Erectile dysfunction 12/25/2016  . Encounter for therapeutic drug monitoring 11/19/2016  . Paroxysmal atrial fibrillation (HCC)   . Congestive heart failure (CHF) (HCC) 07/05/2016  . Morbid obesity with body mass index (BMI) of 60.0 to 69.9 in adult [facility]) 07/05/2016  . CKD (chronic kidney disease), stage III 07/05/2016  . Chronic systolic heart failure (HCC) 06/03/2014  .  Hyperlipidemia 11/25/2012  . Prolonged QT interval 11/24/2012  . Healthcare maintenance 09/03/2012  . HTN (hypertension) 02/19/2012  . Sleep apnea 02/10/2012  . Morbid obesity (HCC) 02/10/2012    Past Surgical History:  Procedure Laterality Date  . ANTERIOR CRUCIATE LIGAMENT REPAIR     right  . CARDIOVERSION N/A 11/07/2016   Procedure: CARDIOVERSION;  Surgeon: Dolores Patty, MD;  Location: [facility] OR;  Service: Cardiovascular;  Laterality: N/A;  Will need extra help positioning patient  . LEFT HEART CATHETERIZATION WITH CORONARY ANGIOGRAM N/A 11/24/2012   Procedure: LEFT HEART CATHETERIZATION WITH CORONARY ANGIOGRAM;  Surgeon: Peter M Swaziland, MD;  Location: [facility] CATH LAB;  Service: Cardiovascular;  Laterality: N/A;  . TEE WITHOUT CARDIOVERSION N/A 11/07/2016   Procedure: TRANSESOPHAGEAL ECHOCARDIOGRAM (TEE);  Surgeon: Dolores Patty, MD;  Location: [facility] OR;  Service: Cardiovascular;  Laterality: N/A;       Home Medications    Prior to Admission medications   Medication Sig Start Date End Date Taking? Authorizing Provider  amiodarone (PACERONE) 200 MG tablet Take 1 tablet (200 mg total) by mouth 2 (two) times daily. 11/14/16   Clegg, Amy D, NP  atorvastatin (LIPITOR) 40 MG tablet Take 1 tablet (40 mg total) by mouth daily. 10/15/16   Laurey Morale, MD  carvedilol (COREG) 3.125 MG tablet Take 1 tablet (3.125 mg total) by mouth 2 (two) times daily. 11/20/16   Little Ishikawa, NP  digoxin (LANOXIN) 0.25 MG tablet Take 1 tablet (0.25 mg  total) by mouth daily. 11/14/16   Clegg, Amy D, NP  hydrALAZINE (APRESOLINE) 50 MG tablet Take 1 tablet (50 mg total) by mouth every 8 (eight) hours. 11/14/16   Clegg, Amy D, NP  isosorbide mononitrate (IMDUR) 60 MG 24 hr tablet Take 1 tablet (60 mg total) by mouth daily. 11/14/16   Clegg, Amy D, NP  losartan (COZAAR) 25 MG tablet Take 0.5 tablets (12.5 mg total) by mouth at bedtime. 12/25/16   Graciella Freer, PA-C  sildenafil (VIAGRA) 100 MG tablet Take 1  tablet (100 mg total) by mouth daily as needed for erectile dysfunction. 12/04/16   Little Ishikawa, NP  spironolactone (ALDACTONE) 25 MG tablet Take 1 tablet (25 mg total) by mouth daily. 10/15/16   Laurey Morale, MD  torsemide (DEMADEX) 20 MG tablet Take 40 mg (2 Tablets) in the AM and 20 mg (1 Tablet) in the PM 12/04/16   Little Ishikawa, NP  warfarin (COUMADIN) 7.5 MG tablet Take 1 tablet (7.5 mg total) by mouth daily at 6 PM. 11/14/16   Clegg, Amy D, NP    Family History Family History  Problem Relation Age of Onset  . Hypertension Father   . Heart failure Father        paternal side of family   . Hypertension Sister   . Stroke Maternal Grandmother   . Hypertension Unknown        maternal side of family     Social History Social History  Substance Use Topics  . Smoking status: Never Smoker  . Smokeless tobacco: Never Used  . Alcohol use 0.0 oz/week     Comment: seldom.     Allergies   Aspirin   Review of Systems Review of Systems  All other systems reviewed and are negative.    Physical Exam Updated Vital Signs BP (!) 149/90 (BP Location: Right Arm)   Pulse 85   Resp 16   Ht 1.88 m (6\' 2" )   Wt (!) 219.9 kg (484 lb 14.4 oz)   SpO2 95%   BMI 62.26 kg/m   Physical Exam  Constitutional: No distress.  Morbidly obese  HENT:  Head: Normocephalic and atraumatic. Head is without raccoon's eyes and without Battle's sign.  Right Ear: External ear normal.  Left Ear: External ear normal.  Eyes: Lids are normal. Right eye exhibits no discharge. Right conjunctiva has no hemorrhage. Left conjunctiva has no hemorrhage.  Neck: No spinous process tenderness present. No tracheal deviation and no edema present.  Cardiovascular: Normal rate, regular rhythm and normal heart sounds.   Pulmonary/Chest: Effort normal and breath sounds normal. No stridor. No respiratory distress. He exhibits no tenderness, no crepitus and no deformity.  Abdominal: Soft. Normal appearance and bowel  sounds are normal. He exhibits no distension and no mass. There is no tenderness.  Negative for seat belt sign  Musculoskeletal:       Cervical back: He exhibits no tenderness, no swelling and no deformity.       Thoracic back: He exhibits no tenderness, no swelling and no deformity.       Lumbar back: He exhibits no tenderness and no swelling.  Pelvis stable, no ttp  Neurological: He is alert. He has normal strength. No sensory deficit. He exhibits normal muscle tone. GCS eye subscore is 4. GCS verbal subscore is 5. GCS motor subscore is 6.  Able to move all extremities, sensation intact throughout  Skin: He is not diaphoretic.  Psychiatric: He has a  normal mood and affect. His speech is normal and behavior is normal.  Nursing note and vitals reviewed.    ED Treatments / Results  Labs (all labs ordered are listed, but only abnormal results are displayed) Labs Reviewed  BASIC METABOLIC PANEL - Abnormal; Notable for the following:       Result Value   Glucose, Bld 114 (*)    Creatinine, Ser 1.38 (*)    All other components within normal limits  PROTIME-INR - Abnormal; Notable for the following:    Prothrombin Time 22.2 (*)    All other components within normal limits  CBG MONITORING, ED - Abnormal; Notable for the following:    Glucose-Capillary 116 (*)    All other components within normal limits  CBC  I-STAT TROPOININ, ED    EKG  EKG Interpretation  Date/Time:  Wednesday Jan 01 2017 10:40:23 EDT Ventricular Rate:  86 PR Interval:    QRS Duration: 115 QT Interval:  406 QTC Calculation: 486 R Axis:   -9 Text Interpretation:  Sinus rhythm Left atrial enlargement LVH with IVCD and secondary repol abnrm Borderline prolonged QT interval Since last tracing rate slower Confirmed by Linwood Dibbles [id number]) on 01/01/2017 12:01:01 PM       Radiology Dg Chest Portable 1 View  Result Date: 01/01/2017 CLINICAL DATA:  Syncopal episode.  Chest pain . EXAM: PORTABLE CHEST 1 VIEW  COMPARISON:  11/02/2016. FINDINGS: Interval removal of previously identified PICC line. Cardiomegaly with increased pulmonary venous congestion and interstitial prominence suggesting CHF. Bilateral pleural effusions are most likely present. Lung bases are incompletely imaged. No pneumothorax identified . IMPRESSION: Congestive heart failure with bilateral interstitial edema . Electronically Signed   By: Maisie Fus  Register   On: 01/01/2017 11:22    Procedures Procedures (including critical care time)  Medications Ordered in ED Medications  furosemide (LASIX) injection 60 mg (not administered)  potassium chloride SA (K-DUR,KLOR-CON) CR tablet 40 mEq (not administered)  acetaminophen (TYLENOL) tablet 650 mg (650 mg Oral Given 01/01/17 1104)     Initial Impression / Assessment and Plan / ED Course  I have reviewed the triage vital signs and the nursing notes.  Pertinent labs & imaging results that were available during my care of the patient were reviewed by me and considered in my medical decision making (see chart for details).   patient presents the emergency room for syncope. He describes being emotionally upset for this episode occurred. Patient unfortunately has a history of nonischemic cardiomyopathy and congestive heart failure. He certainly at risk for dangerous ventricular arrhythmia.  Chest x-ray does show a component of congestive heart failure. I ordered a dose of IV Lasix as well as potassium supplementation. I will consult with the medical service for cardiac monitoring overnight. Consider cardiology consultation.  Final Clinical Impressions(s) / ED Diagnoses   Final diagnoses:  Syncope and collapse  Acute on chronic congestive heart failure, unspecified heart failure type [facility])       Linwood Dibbles, MD 01/01/17 1218

## 2017-01-01 NOTE — Progress Notes (Signed)
Patient admitted to [facility], [facility].  No complaints.  Diuresing well.

## 2017-01-01 NOTE — ED Triage Notes (Signed)
Patient presents via [facility] after a syncopal episode the occurred after he got "emotionally worked up". Patient c/o intermittent chest pain since 0830 this morning prior to syncopal event. Patient c/o posterior head pain on arrival. Patient a/0 x4 on arrival to ED

## 2017-01-01 NOTE — H&P (Signed)
Date: 01/01/2017               Patient Name:  Mark Baker MRN: [id number]  DOB: [birth year] Age / Sex: 36 y.o., male   PCP: Mark Baker         Medical Service: Internal Medicine Teaching Service         Attending Physician: Dr. Earl Lagos, MD    First Contact: Raoul Pitch, MS 4 Pager: [phone]  Second Contact: Dr. 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 He had a heated argument with his wife this morning during which he began to feel "really worked up." After the argument he spoke to his daughter for a few minutes. He then walked into the hallway, and his daughter heard him fall to the floor. Prior to the episode he had a pressure-like pain in the right side of his chest extending into his abdomen. This pain "felt like heartburn" and did not radiate to either arm which is different from previous cardiac complaints. He felt lightheaded prior to the episode. He did not experience palpitations, dyspnea, vision changes, bowel/bladder incontinence, or diaphroesis. He had no headache before the event but afterwards had a fairly severe headache he thinks is from hitting his head. Following the episode, he did not feel confused, but he did experience mild photophobia. He cannot recall events between falling and arriving in the ED.  He denies any similar events in the past. He feels like the preceding symptoms were due to anxiety and stress after his argument. He has no persistent symptoms. His only recent medication change is starting losartan 12.5mg  for blood pressure. He has been in otherwise good health. His weight  is slightly up at 384 today from 377 2 months ago after diuresis in the hospital from 10/31/16-11/14/16, where he was found to have with new onset atrial fibrillation that spontaneously coverted. He continues to take amiodarone and coumadin since that time with good reported compliance and knowledge of his regimen.  Meds:  Current Meds  Medication Sig  . amiodarone (PACERONE) 200 MG tablet Take 1 tablet (200 mg total) by mouth 2 (two) times daily.  Marland Kitchen atorvastatin (LIPITOR) 40 MG tablet Take 1 tablet (40 mg total) by mouth daily.  . carvedilol (COREG) 3.125 MG tablet Take 1 tablet (3.125 mg total) by mouth 2 (two) times daily.  . digoxin (LANOXIN) 0.25 MG tablet Take 1 tablet (0.25 mg total) by mouth daily.  . hydrALAZINE (APRESOLINE) 50 MG tablet Take 1 tablet (50 mg total) by mouth every 8 (eight) hours.  . isosorbide mononitrate (IMDUR) 60 MG 24 hr tablet Take 1 tablet (60 mg total) by mouth daily.  Marland Kitchen losartan (COZAAR) 25 MG tablet Take 0.5 tablets (12.5 mg total) by mouth at bedtime.  . sildenafil (VIAGRA) 100 MG tablet Take 1 tablet (100 mg total) by mouth daily as needed for erectile dysfunction.  Marland Kitchen spironolactone (ALDACTONE) 25 MG tablet Take 1 tablet (25 mg total) by mouth daily.  Marland Kitchen torsemide (DEMADEX) 20 MG tablet Take 40 mg (2 Tablets) in the AM and 20 mg (  1 Tablet) in the PM (Patient taking differently: Take 20-40 mg by mouth 2 (two) times daily. Take 40 mg (2 Tablets) in the AM and 20 mg (1 Tablet) in the PM)  . warfarin (COUMADIN) 7.5 MG tablet Take 1 tablet (7.5 mg total) by mouth daily at 6 PM. (Patient taking differently: Take 3.75-7.5 mg by mouth daily at 6 PM. 1 tablet M/W/F and 1/2 tablet Tu/Th/Sat/Sun)     Allergies: Allergies as of 01/01/2017 - Review Complete 01/01/2017  Allergen Reaction Noted  . Aspirin Hives, Itching, and Rash 10/21/2012   Past Medical History:  Diagnosis Date  . Asthma    childhood asthma no exacerbation since age 71   . Chronic combined systolic and  diastolic CHF (congestive heart failure) (HCC)    a) ECHO (11/2012): EF 40-45%, RV fx difficult to see, nl size b) ECHO (05/2014): EF 20-25%, grade 2 DD, RV mildly dilated and sys fx mod reduced  . Hypertension   . NICM (nonischemic cardiomyopathy) (HCC)    a) (11/2012): normal coronaries  . OSA (obstructive sleep apnea)     Family History: Father - Heart failure, HTN      Sister - HTN      MGM - Stroke  Social History: Works for [employer]     Drinks few times Baker year     No tobacco use, no illicit drug use  Review of Systems: A complete ROS was negative except as Baker HPI.  Physical Exam: Blood pressure (!) 155/89, pulse 88, temperature 98.9 F (37.2 C), temperature source Oral, resp. rate 12, height 6\' 2"  (1.88 m), weight (!) 482 lb (218.6 kg), SpO2 92 %. GENERAL- alert, co-operative, NAD HEENT- Atraumatic, oral mucosa appears moist CARDIAC- RRR, no murmurs, rubs or gallops RESP- Distant breath sounds, CTAB, normal WOB EXTREMITIES- BP pulse 2+ bialterally, no pedal edema SKIN- Warm, dry, No rash or lesion PSYCH- Normal mood and affect, appropriate thought content and speech   EKG: Normal sinus rhythm at 86 bpm with LVH, LAE, IVCD, T wave with repolarization abnormality  CXR: Cardiomegaly and interstitial markings suggesting pulmonary edema  Assessment & Plan by Problem: #Syncope No specific cause for syncope is obvious from the history. Cardiogenic syncope remains the biggest concern with severely impaired LVEF he is at risk for sudden ventricular arrhythmias. Vasovagal or situational syncope related to his agitated state is possible. Orthostatic hypotension is possible with recent antihypertensive medication although only 12.5mg  losartan daily. Seizure seems extremely unlikely without evidence of postictal changes or observed movement or confusion by family or EMS. Head CT is negative for ICH after his fall striking his head. Troponins are negative x2 and EKG appears normal  for him. We will monitor overnight telemetry, check EKG in AM, check orthostatics, and discuss with cardiology tomorrow if additional inpatient or outpatient monitoring would be recommended.   #Chronic combined systolic and diastolic CHF  #HTN #Morbid obesity EF 20-25% as of 10/2016 TTE attributed to non-ischemic cardiomyopathy with his hypertension and severe obesity. Managed by Dr. Gala Romney with Advanced Heart Failure Team. He is on an extensive evidence based heart failure regimen torsemide, digoxin, hydralazine, imdur, spironolactone, coreg, with the recent addition of losartan.  #Paroxysmal atrial fibrillation Recently hospitalization from 10/31/16-11/14/16 with new onset atrial fibrillation; started on amiodarone and coumadin and spontaneously converted to normal sinus rhythm. INR on admission 1.91. He is in sinus rhythm since arrival to the hospital. This seems unlikely to be contributing as his past episode presented only as decompensating heart  failure.  #Hyperlipidemia Continuing home statin dose  #Obstructive sleep apnea Sleep study in 2014 by D.r Young. CPAP at night. He reports good compliance with this at home  #CKD stage III Creatinine upon admission 1.38 at his baseline. He was eating normally until the night before this event. We will repeat AM Bmet but it is unlikely he has any metabolic derangement contributing to syncope.  Dispo: Admit patient to Observation with expected length of stay less than 2 midnights.  Signed: Fuller Plan, MD PGY-II Internal Medicine Resident Pager# [phone] 01/01/2017, 6:27 PM

## 2017-01-02 DIAGNOSIS — Z886 Allergy status to analgesic agent status: Secondary | ICD-10-CM

## 2017-01-02 DIAGNOSIS — I480 Paroxysmal atrial fibrillation: Secondary | ICD-10-CM

## 2017-01-02 DIAGNOSIS — Z7901 Long term (current) use of anticoagulants: Secondary | ICD-10-CM

## 2017-01-02 DIAGNOSIS — Z7902 Long term (current) use of antithrombotics/antiplatelets: Secondary | ICD-10-CM

## 2017-01-02 DIAGNOSIS — I130 Hypertensive heart and chronic kidney disease with heart failure and stage 1 through stage 4 chronic kidney disease, or unspecified chronic kidney disease: Secondary | ICD-10-CM

## 2017-01-02 DIAGNOSIS — R55 Syncope and collapse: Secondary | ICD-10-CM

## 2017-01-02 DIAGNOSIS — Z6841 Body Mass Index (BMI) 40.0 and over, adult: Secondary | ICD-10-CM

## 2017-01-02 DIAGNOSIS — I5022 Chronic systolic (congestive) heart failure: Secondary | ICD-10-CM

## 2017-01-02 DIAGNOSIS — E785 Hyperlipidemia, unspecified: Secondary | ICD-10-CM

## 2017-01-02 DIAGNOSIS — I4581 Long QT syndrome: Secondary | ICD-10-CM

## 2017-01-02 DIAGNOSIS — N183 Chronic kidney disease, stage 3 (moderate): Secondary | ICD-10-CM

## 2017-01-02 DIAGNOSIS — Z79899 Other long term (current) drug therapy: Secondary | ICD-10-CM

## 2017-01-02 DIAGNOSIS — G4730 Sleep apnea, unspecified: Secondary | ICD-10-CM

## 2017-01-02 LAB — BASIC METABOLIC PANEL
Anion gap: 10 (ref 5–15)
BUN: 15 mg/dL (ref 6–20)
CHLORIDE: 99 mmol/L — AB (ref 101–111)
CO2: 30 mmol/L (ref 22–32)
CREATININE: 1.41 mg/dL — AB (ref 0.61–1.24)
Calcium: 9.1 mg/dL (ref 8.9–10.3)
GFR calc Af Amer: >60 mL/min (ref >60)
GFR calc non Af Amer: >60 mL/min (ref >60)
GLUCOSE: 102 mg/dL — AB (ref 65–99)
POTASSIUM: 3.5 mmol/L (ref 3.5–5.1)
Sodium: 139 mmol/L (ref 135–145)

## 2017-01-02 LAB — PROTIME-INR
INR: 1.86
Prothrombin Time: 21.7 seconds — ABNORMAL HIGH (ref 11.4–15.2)

## 2017-01-02 LAB — DIGOXIN LEVEL: Digoxin Level: 0.7 ng/mL — ABNORMAL LOW (ref 0.8–2.0)

## 2017-01-02 NOTE — Progress Notes (Signed)
MD aware states she will write an out of work letter and come back  [employer]

## 2017-01-02 NOTE — Progress Notes (Signed)
Subjective: Mark Baker is doing well this morning. He no longer has a headache. He denies any further episodes of dizziness or syncope. He is eager to go home.  Objective: Vital signs in last 24 hours: Vitals:   01/01/17 1539 01/01/17 2003 01/02/17 0543 01/02/17 0947  BP: (!) 155/89 (!) 144/78 126/83 (!) 142/86  Pulse: 88 80 84 79  Resp: 12 16 16 18   Temp: 98.9 F (37.2 C) 98.1 F (36.7 C) 98.2 F (36.8 C)   TempSrc: Oral Oral Oral   SpO2: 92% 94% 95%   Weight: (!) 218.6 kg (482 lb)  (!) 218 kg (480 lb 9.6 oz)   Height: 6\' 2"  (1.88 m)      Intake/Output Summary (Last 24 hours) at 01/02/17 1015 Last data filed at 01/02/17 0827  Gross per 24 hour  Intake              840 ml  Output             2525 ml  Net            -1685 ml   Physical Exam General: Obese male resting comfortably in bed. HEENT: Moist mucus membranes. Cardiac: Regular rate and rhythm. No murmurs, rubs, or gallops. Pulmonary: Normal work of breathing. Clear to auscultation bilaterally. Abdominal: Soft, non-distended. Extremities: Warm and dry. No peripheral edema. Eczema of shins bilaterally.  Lab Results: Basic Metabolic Panel:  Recent Labs Lab 01/01/17 1107 01/02/17 0351  NA 139 139  K 3.6 3.5  CL 101 99*  CO2 27 30  GLUCOSE 114* 102*  BUN 14 15  CREATININE 1.38* 1.41*  CALCIUM 8.9 9.1   CBC:  Recent Labs Lab 01/01/17 1107  WBC 7.6  HGB 14.8  HCT 47.5  MCV 91.7  PLT 229   Coagulation:  Recent Labs Lab 01/01/17 1107 01/02/17 0351  LABPROT 22.2* 21.7*  INR 1.91 1.86   Mark Baker is a 36 year old male with a history of chronic combined systolic and diastolic CHF (EF 23-30%) and paroxysmal atrial fibrillation who presented 5/23 following a syncopal episode in the setting of severe emotional stress.  Problem List: -- Syncope -- Chronic combined systolic and diastolic CHF -- HTN -- Paroxysmal atrial fibrillation -- Morbid obesity -- Hyperlipidemia -- Obstructive sleep apnea --  CKD stage III  Assessment and Plan By Problem: #Syncope Repeat EKG and telemetry unremarkable. At this point appears most likely to have been a vasovagal episode, but cardiac arrhythmia cannot be ruled out, especially given that he is at high risk for sudden ventricular arrhythmia. Spoke with Dr. Gala Romney -- said that he will get the patient set up with a 30-day event monitor. Also advised [place] to recommend that the patient does not drive for the next six months. He will need to follow-up with Advanced Heart Failure as well as his PCP at [facility]. Given that no further work-up is needed as an inpatient, he is now stable for discharge. -- Stable for discharge with close outpatient follow-up  #Chronic combined systolic and diastolic CHF  #HTN EF 20-25% as of 10/2016. Thought to be due to non-ischemic cardiomyopathy. Managed by Dr. Gala Romney with Advanced Heart Failure Team and will follow-up with them. -- Continue home torsemide 40mg  every morning, 20mg  every evening -- Continue home digoxin 0.25mg  daily -- Continue home hydralazine 50mg  every 8 hours -- Continue home Imdur 60mg  daily -- Continue home spironolactone 25mg  daily -- Continue home carvedilol 3.125mg  twice daily -- Continue home Losartan 12.5mg  once daily --  Follow-up with Advanced Heart Failure as an outpatient  #Paroxysmal atrial fibrillation Recently hospitalization from 10/31/16-11/14/16 with new onset atrial fibrillation; started on amiodarone and coumadin and spontaneously converted to normal sinus rhythm. INR on admission 1.91; INR today 1.86. EKG on admission, repeat EKG this morning, and telemetry all sinus rhythm. -- Continue home amiodarone 200mg  twice daily -- Continue home warfarin 3.75mg  Sunday, Tuesday, Thursday, Saturday; 7.5mg  Monday, Wednesday, Friday  #Morbid obesity #Hyperlipidemia Weight of 482 lbs; BMI of 62. Reports that he is on an exercise regimen and working to eat a healthy diet. -- Continue home  atorvastatin 40mg  daily  #Obstructive sleep apnea On CPAP at home; reports that he tries to use it every night. -- Continue home CPAP nightly  #CKD stage III Baseline creatinine 1.3-1.5. Creatinine upon admission 1.38; creatinine today stable at 1.41.   DVT PPX: Home Warfarin FENGI: Heart Healthy Code: Full Code Dispo: Likely discharge later today.  This is a Psychologist, occupational Note.  The care of the patient was discussed with Dr. Isabella Bowens and the assessment and plan formulated with their assistance.  Please see their attached note for official documentation of the daily encounter.   LOS: 0 days   Mark Baker, Medical Student 01/02/2017, 10:15 AM

## 2017-01-02 NOTE — Progress Notes (Signed)
Pt requesting to leave AMA. Paged MD regarding discharge, awaiting call back. Pt states he follows CHF team outpt regularly    Mark Baker

## 2017-01-02 NOTE — Progress Notes (Signed)
Pt discharge eduction provided at bedside. Pt has all belongings including discharge paperwork. Pt requesting out of work note, page out to MD. Pt IV discontinued, catheter intact and telemetry removed  Mark Baker

## 2017-01-02 NOTE — Progress Notes (Signed)
MD returned page and stated she would place discharge orders  Mark Baker [name]

## 2017-01-02 NOTE — Discharge Summary (Signed)
Name: Mark Baker MRN: [id number] DOB: 1981/06/27 36 y.o. PCP: Patient, No Pcp Per  Date of Admission: 01/01/2017 10:26 AM Date of Discharge: 01/02/2017 Attending Physician: Earl Lagos, MD  Discharge Diagnosis: Principal Problem:   Syncope Active Problems:   Sleep apnea   Morbid obesity (HCC)   HTN (hypertension)   Prolonged QT interval   Hyperlipidemia   Chronic HFrEF (heart failure with reduced ejection fraction) (HCC)   CKD (chronic kidney disease), stage III   Paroxysmal atrial fibrillation [facility])   Discharge Medications: Allergies as of 01/02/2017      Reactions   Aspirin Hives, Itching, Rash      Medication List    TAKE these medications   amiodarone 200 MG tablet Commonly known as:  PACERONE Take 1 tablet (200 mg total) by mouth 2 (two) times daily.   atorvastatin 40 MG tablet Commonly known as:  LIPITOR Take 1 tablet (40 mg total) by mouth daily.   carvedilol 3.125 MG tablet Commonly known as:  COREG Take 1 tablet (3.125 mg total) by mouth 2 (two) times daily.   digoxin 0.25 MG tablet Commonly known as:  LANOXIN Take 1 tablet (0.25 mg total) by mouth daily.   hydrALAZINE 50 MG tablet Commonly known as:  APRESOLINE Take 1 tablet (50 mg total) by mouth every 8 (eight) hours.   isosorbide mononitrate 60 MG 24 hr tablet Commonly known as:  IMDUR Take 1 tablet (60 mg total) by mouth daily.   losartan 25 MG tablet Commonly known as:  COZAAR Take 0.5 tablets (12.5 mg total) by mouth at bedtime.   sildenafil 100 MG tablet Commonly known as:  VIAGRA Take 1 tablet (100 mg total) by mouth daily as needed for erectile dysfunction.   spironolactone 25 MG tablet Commonly known as:  ALDACTONE Take 1 tablet (25 mg total) by mouth daily.   torsemide 20 MG tablet Commonly known as:  DEMADEX Take 40 mg (2 Tablets) in the AM and 20 mg (1 Tablet) in the PM What changed:  how much to take  how to take this  when to take this  additional  instructions   warfarin 7.5 MG tablet Commonly known as:  COUMADIN Take 1 tablet (7.5 mg total) by mouth daily at 6 PM. What changed:  how much to take  additional instructions       Disposition and follow-up:   [name] was discharged from [facility] in Good condition.  At the hospital follow up visit please address:  1.  Syncope: There were no findings during observation to suggest a cardiac event but his history is very high risk. He plans to follow up with cardiology for outpatient event monitor placement and was instructed to not drive for 6 months while being fully evaluated.   Follow-up Appointments: Follow-up Information    [facility]. Go on 01/10/2017.   Why:  2:40pm for hospital follow up Contact information: [address] [place] [address] [phone]          Hospital Course by problem list: Principal Problem:   Syncope Active Problems:   Sleep apnea   Morbid obesity (HCC)   HTN (hypertension)   Prolonged QT interval   Hyperlipidemia   Chronic HFrEF (heart failure with reduced ejection fraction) (HCC)   CKD (chronic kidney disease), stage III   Paroxysmal atrial fibrillation (HCC)   Syncope He was admitted for observation with no acute EKG changes, negative troponins, and no persistent symptoms after  presentation. The event was precipitated by an argument at home and he reported feeling extremely stressed. No events were recorded on cardiac monitoring overnight. No further episodes of syncope and repeat EKG this morning with no significant arrhythmias or other abnormalities. Dr. Gala Romney was alerted that patient is here in hospital and his team will arrange for outpatient cardiac monitoring. Patient was instructed to not drive for 6 months.   Paroxysmal atrial fibrillation He remained in sinus rhythm with a HR mostly in the 80s during monitoring overnight. No changes in treatment  plan.   Discharge Vitals:   BP (!) 143/96 (BP Location: Left Wrist)   Pulse 81   Temp 98.2 F (36.8 C) (Oral)   Resp 18   Ht 6\' 2"  (1.88 m)   Wt (!) 480 lb 9.6 oz (218 kg)   SpO2 94%   BMI 61.71 kg/m    Discharge Instructions: Discharge Instructions    Call MD for:  difficulty breathing, headache or visual disturbances    Complete by:  As directed    Call MD for:  persistant dizziness or light-headedness    Complete by:  As directed    Call MD for:  persistant nausea and vomiting    Complete by:  As directed    Call MD for:  severe uncontrolled pain    Complete by:  As directed    Diet - low sodium heart healthy    Complete by:  As directed    Discharge instructions    Complete by:  As directed    Please follow up with your heart doctor. If you have recurrent/worsening symptoms or develop new symptoms, please call your doctor or come to the ED.  Don't drive for 6 months.   Driving Restrictions    Complete by:  As directed    Don't drive for 6 months   Increase activity slowly    Complete by:  As directed       Signed: Fuller Plan, MD PGY-II Internal Medicine Resident 01/06/2017, 1:09 PM

## 2017-01-02 NOTE — Discharge Instructions (Signed)
Syncope °Syncope is when you lose temporarily pass out (faint). Signs that you may be about to pass out include: °· Feeling dizzy or light-headed. °· Feeling sick to your stomach (nauseous). °· Seeing all Jain or all black. °· Having cold, clammy skin. °If you passed out, get help right away. Call your local emergency services (911 in the [place] Do not drive yourself to the hospital. °Follow these instructions at home: °Pay attention to any changes in your symptoms. Take these actions to help with your condition: °· Have someone stay with you until you feel stable. °· Do not drive, use machinery, or play sports until your doctor says it is okay. °· Keep all follow-up visits as told by your doctor. This is important. °· If you start to feel like you might pass out, lie down right away and raise (elevate) your feet above the level of your heart. Breathe deeply and steadily. Wait until all of the symptoms are gone. °· Drink enough fluid to keep your pee (urine) clear or pale yellow. °· If you are taking blood pressure or heart medicine, get up slowly and spend many minutes getting ready to sit and then stand. This can help with dizziness. °· Take over-the-counter and prescription medicines only as told by your doctor. °Get help right away if: °· You have a very bad headache. °· You have unusual pain in your chest, tummy, or back. °· You are bleeding from your mouth or rectum. °· You have black or tarry poop (stool). °· You have a very fast or uneven heartbeat (palpitations). °· It hurts to breathe. °· You pass out once or more than once. °· You have jerky movements that you cannot control (seizure). °· You are confused. °· You have trouble walking. °· You are very weak. °· You have vision problems. °These symptoms may be an emergency. Do not wait to see if the symptoms will go away. Get medical help right away. Call your local emergency services (911 in the [place] Do not drive yourself to the hospital. °This  information is not intended to replace advice given to you by your health care provider. Make sure you discuss any questions you have with your health care provider. °Document Released: 01/15/2008 Document Revised: 01/04/2016 Document Reviewed: 04/12/2015 °Elsevier Interactive Patient Education © 2017 Else[employer] Inc[employer] ° °

## 2017-01-02 NOTE — Progress Notes (Signed)
Pt family at bedside, update given. Pt resting comfortably, no complaints at this time.  Rick Warnick

## 2017-01-02 NOTE — Progress Notes (Signed)
Pt discharged via wheelchair with RN. 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 Pt independent and alert and oriented.  Mark Baker

## 2017-01-02 NOTE — Progress Notes (Signed)
   Subjective: Doing well this morning. No further episodes of syncope.   Objective:  Vital signs in last 24 hours: Vitals:   01/01/17 1330 01/01/17 1539 01/01/17 2003 01/02/17 0543  BP: 134/84 (!) 155/89 (!) 144/78 126/83  Pulse: 91 88 80 84  Resp: 17 12 16 16   Temp:  98.9 F (37.2 C) 98.1 F (36.7 C) 98.2 F (36.8 C)  TempSrc:  Oral Oral Oral  SpO2: 95% 92% 94% 95%  Weight:  (!) 482 lb (218.6 kg)  (!) 480 lb 9.6 oz (218 kg)  Height:  6\' 2"  (1.88 m)     General Apperance: NAD HEENT: Normocephalic, atraumatic, anicteric sclera Neck: Supple, trachea midline Lungs: Clear to auscultation bilaterally. No wheezes, rhonchi or rales. Breathing comfortably Heart: Regular rate and rhythm, no murmur/rub/gallop Abdomen: Soft, nontender, nondistended, no rebound/guarding Extremities: Warm and well perfused, no edema Skin: No rashes or lesions Neurologic: Alert and interactive. No gross deficits.  I independently reviewed his EKG which shows NSR, unchanged from previous EKG.  Assessment/Plan: 36 year old man with systolic and diastolic heart failure (LVEF 20-25%) 2/2 NICM, PAF, morbid obesity, OSA, CKD stage III, and HTN who presented to the ED after a syncopal episode.  Syncope: No further episodes of syncope. Repeat EKG this morning with no significant arrhythmias or other abnormalities. His cardiologist, Dr. Gala Romney was alerted that patient is here in hospital. His team will arrange for outpatient cardiac monitoring. Patient instructed to not drive for 6 months.   Chronic combined systolic and diastolic CHF: Continue home carvedilol, hydralazine/Imdur, losartan, spironolactone, Torsemide.  HTN: Systolic BP in the 140s and diastolic in the 80s-90s.   Paroxysmal atrial fibrillation: HR in the 80s. Continue home amiodarone, carvedilol, digoxin, coumadin HLD: Continue home atorvastatin.  CKD stage 3: Cr stable at 1.4 today.   Dispo: Anticipated discharge today.   Mark Paula, MD 01/02/2017, 7:47 AM Pager: [phone]

## 2017-01-03 ENCOUNTER — Telehealth (HOSPITAL_COMMUNITY): Payer: Self-pay | Admitting: *Deleted

## 2017-01-03 NOTE — Telephone Encounter (Signed)
Opened in error

## 2017-01-07 ENCOUNTER — Telehealth (HOSPITAL_COMMUNITY): Payer: Self-pay

## 2017-01-07 ENCOUNTER — Telehealth (HOSPITAL_COMMUNITY): Payer: Self-pay | Admitting: *Deleted

## 2017-01-07 DIAGNOSIS — R55 Syncope and collapse: Secondary | ICD-10-CM

## 2017-01-07 NOTE — Telephone Encounter (Signed)
Pt had called me while I was out with another patient-stating he wasn't feeling too well-when I called back once cleared from other patient mr Hammock's wife answered the phone and stated that she called 911 as he has passed out, but is now conscious and alert. I notified the responding EMS unit and advised them he was a paramedicine patient. The medic called me once they obtained assessment, he has been taking his losartan at night, was recently seen at hosp for same feelings of dizziness, his HR increased 10 points while standing, CBG within normal limits, no orthostatic changes with b/p. I called and left message on triage line since it was a holiday, at the time EMS was there he refused to be txp to hosp again, he has lab appointment on 5/30 and plans to make it there. Susie advised she will send note over to provider and if anything further than she will let me know and I will contact pt back.

## 2017-01-07 NOTE — Telephone Encounter (Signed)
I called and spoke with patient, he is agreeable to the even monitor and stated "I will try my best to not drive, but this will be difficult."  I educated patient on why he should not be driving per MD and he has no further questions at this time.  Kamilah with scheduling is sending message to arrange event monitor. Order has been placed.

## 2017-01-07 NOTE — Telephone Encounter (Signed)
-----   Message from Dolores Patty, MD sent at 01/07/2017  2:09 PM EDT ----- Regarding: RE: Syncopal Episode Please place event monitor on him ASAP. Thanks.   No driving for 6 months   ----- Message ----- From: Suezanne Cheshire, RN Sent: 01/07/2017   9:25 AM To: Dolores Patty, MD Subject: Syncopal Episode                               Katie with para medicine called letting us know that patient had another syncopal episode over the weekend.  When the ambulance arrived all vitals were stable.  Katie said when she checked orthostatic vials everything was normal expect his HR did increase by 10. Patient isn't due for another apt with you until July.  Did you want to see him sooner? He is coming in for BMET tomorrow.

## 2017-01-08 ENCOUNTER — Other Ambulatory Visit (HOSPITAL_COMMUNITY): Payer: Managed Care, Other (non HMO)

## 2017-01-09 ENCOUNTER — Encounter (HOSPITAL_BASED_OUTPATIENT_CLINIC_OR_DEPARTMENT_OTHER): Payer: Managed Care, Other (non HMO)

## 2017-01-10 ENCOUNTER — Telehealth (HOSPITAL_COMMUNITY): Payer: Self-pay | Admitting: Vascular Surgery

## 2017-01-10 ENCOUNTER — Other Ambulatory Visit (HOSPITAL_COMMUNITY): Payer: Self-pay

## 2017-01-10 ENCOUNTER — Encounter: Payer: Managed Care, Other (non HMO) | Admitting: Family Medicine

## 2017-01-10 NOTE — Progress Notes (Deleted)
Subjective:  By signing my name below, I, Essence Howell, attest that this documentation has been prepared under the direction and in the presence of Norberto Sorenson, MD Electronically Signed: Charline Bills, ED Scribe 01/10/2017 at 2:11 PM.   Patient ID: Mark Baker, male    DOB: [birth year], 36 y.o.   MRN: [id number]  No chief complaint on file.  HPI Mark Baker is a 36 y.o. male who presents to Primary Care at [facility] for a hospital follow-up from a syncopal episode which occurred 5/23, discharged 5/24. Pt was stressed and upset the morning of admission and developed intermittent chest discomfort lasting for 10 minutes at a times with no radiation, chest pressure or associated symptoms and suffered a syncopal episode. Pt was admitted overnight for chest pain rule out which it was with no EKG changes, negative troponins, normal telemetry. Pt was in sinus rhythm. instructed not to drive for 6 months & Dr. Randa Evens would arrange for event monitor. Pt was visited by the para-medicine EMT this morning with plan to recheck in 2 weeks. His weight was down 4 lbs from the week prior. He has a sleep study scheduled at the end of this month with an echocardiogram and has a cardiology appointment in 5 weeks. Episode was attributed to stress and he has had no further episodes.   Past Medical History:  Diagnosis Date  . Asthma    childhood asthma no exacerbation since age 40   . Chronic combined systolic and diastolic CHF (congestive heart failure) (HCC)    a) ECHO (11/2012): EF 40-45%, RV fx difficult to see, nl size b) ECHO (05/2014): EF 20-25%, grade 2 DD, RV mildly dilated and sys fx mod reduced  . Hypertension   . NICM (nonischemic cardiomyopathy) (HCC)    a) (11/2012): normal coronaries  . OSA (obstructive sleep apnea)    Current Outpatient Prescriptions on File Prior to Visit  Medication Sig Dispense Refill  . amiodarone (PACERONE) 200 MG tablet Take 1 tablet (200 mg total) by mouth 2 (two) times daily. 60  tablet 3  . atorvastatin (LIPITOR) 40 MG tablet Take 1 tablet (40 mg total) by mouth daily. 30 tablet 3  . carvedilol (COREG) 3.125 MG tablet Take 1 tablet (3.125 mg total) by mouth 2 (two) times daily. 60 tablet 3  . digoxin (LANOXIN) 0.25 MG tablet Take 1 tablet (0.25 mg total) by mouth daily. 30 tablet 6  . hydrALAZINE (APRESOLINE) 50 MG tablet Take 1 tablet (50 mg total) by mouth every 8 (eight) hours. 90 tablet 6  . isosorbide mononitrate (IMDUR) 60 MG 24 hr tablet Take 1 tablet (60 mg total) by mouth daily. 30 tablet 6  . losartan (COZAAR) 25 MG tablet Take 0.5 tablets (12.5 mg total) by mouth at bedtime. 15 tablet 3  . sildenafil (VIAGRA) 100 MG tablet Take 1 tablet (100 mg total) by mouth daily as needed for erectile dysfunction. 5 tablet 3  . spironolactone (ALDACTONE) 25 MG tablet Take 1 tablet (25 mg total) by mouth daily. 30 tablet 3  . torsemide (DEMADEX) 20 MG tablet Take 40 mg (2 Tablets) in the AM and 20 mg (1 Tablet) in the PM (Patient taking differently: Take 20-40 mg by mouth 2 (two) times daily. Take 40 mg (2 Tablets) in the AM and 20 mg (1 Tablet) in the PM) 90 tablet 6  . warfarin (COUMADIN) 7.5 MG tablet Take 1 tablet (7.5 mg total) by mouth daily at 6 PM. (Patient taking differently: Take 3.75-7.5 mg  by mouth daily at 6 PM. 1 tablet M/W/F and 1/2 tablet Tu/Th/Sat/Sun) 45 tablet 6   No current facility-administered medications on file prior to visit.    Allergies  Allergen Reactions  . Aspirin Hives, Itching and Rash   Review of Systems    Objective:   Physical Exam There were no vitals taken for this visit.    Assessment & Plan:

## 2017-01-10 NOTE — Progress Notes (Signed)
Paramedicine Encounter    Patient ID: Mark Baker, male    DOB: 05-22-1981, 36 y.o.   MRN: [id number]   Patient Care Team: Patient, No Pcp Per as PCP - General (General Practice)  Patient Active Problem List   Diagnosis Date Noted  . Syncope 01/01/2017  . Erectile dysfunction 12/25/2016  . Encounter for therapeutic drug monitoring 11/19/2016  . Paroxysmal atrial fibrillation (HCC)   . Chronic HFrEF (heart failure with reduced ejection fraction) (HCC) 07/05/2016  . Morbid obesity with body mass index (BMI) of 60.0 to 69.9 in adult [facility]) 07/05/2016  . CKD (chronic kidney disease), stage III 07/05/2016  . Hyperlipidemia 11/25/2012  . Prolonged QT interval 11/24/2012  . Healthcare maintenance 09/03/2012  . HTN (hypertension) 02/19/2012  . Sleep apnea 02/10/2012  . Morbid obesity (HCC) 02/10/2012    Current Outpatient Prescriptions:  .  amiodarone (PACERONE) 200 MG tablet, Take 1 tablet (200 mg total) by mouth 2 (two) times daily., Disp: 60 tablet, Rfl: 3 .  atorvastatin (LIPITOR) 40 MG tablet, Take 1 tablet (40 mg total) by mouth daily., Disp: 30 tablet, Rfl: 3 .  carvedilol (COREG) 3.125 MG tablet, Take 1 tablet (3.125 mg total) by mouth 2 (two) times daily., Disp: 60 tablet, Rfl: 3 .  digoxin (LANOXIN) 0.25 MG tablet, Take 1 tablet (0.25 mg total) by mouth daily., Disp: 30 tablet, Rfl: 6 .  hydrALAZINE (APRESOLINE) 50 MG tablet, Take 1 tablet (50 mg total) by mouth every 8 (eight) hours., Disp: 90 tablet, Rfl: 6 .  isosorbide mononitrate (IMDUR) 60 MG 24 hr tablet, Take 1 tablet (60 mg total) by mouth daily., Disp: 30 tablet, Rfl: 6 .  losartan (COZAAR) 25 MG tablet, Take 0.5 tablets (12.5 mg total) by mouth at bedtime., Disp: 15 tablet, Rfl: 3 .  sildenafil (VIAGRA) 100 MG tablet, Take 1 tablet (100 mg total) by mouth daily as needed for erectile dysfunction., Disp: 5 tablet, Rfl: 3 .  spironolactone (ALDACTONE) 25 MG tablet, Take 1 tablet (25 mg total) by mouth daily., Disp: 30  tablet, Rfl: 3 .  torsemide (DEMADEX) 20 MG tablet, Take 40 mg (2 Tablets) in the AM and 20 mg (1 Tablet) in the PM (Patient taking differently: Take 20-40 mg by mouth 2 (two) times daily. Take 40 mg (2 Tablets) in the AM and 20 mg (1 Tablet) in the PM), Disp: 90 tablet, Rfl: 6 .  warfarin (COUMADIN) 7.5 MG tablet, Take 1 tablet (7.5 mg total) by mouth daily at 6 PM. (Patient taking differently: Take 3.75-7.5 mg by mouth daily at 6 PM. 1 tablet M/W/F and 1/2 tablet Tu/Th/Sat/Sun), Disp: 45 tablet, Rfl: 6 Allergies  Allergen Reactions  . Aspirin Hives, Itching and Rash     Social History   Social History  . Marital status: Married    Spouse name: N/A  . Number of children: N/A  . Years of education: N/A   Occupational History  . Not on file.   Social History Main Topics  . Smoking status: Never Smoker  . Smokeless tobacco: Never Used  . Alcohol use 0.0 oz/week     Comment: seldom.  . Drug use: No  . 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    Other Topics Concern  . Not on file   Social History Narrative   Lives in [place] with wife of 4 years.  Works for [employer] at [employer].  1 son and 2 daughters.  Denies cigarettes. Drank 1 beer last night 11/22/12. Denies drugs           Physical Exam  Constitutional: He is oriented to person, place, and time.  Neck: Normal range of motion.  Cardiovascular: Normal rate and regular rhythm.   Pulmonary/Chest: Effort normal and breath sounds normal. No respiratory distress. He has no wheezes. He has no rales.  Abdominal: Soft. He exhibits no distension.  Musculoskeletal: Normal range of motion. He exhibits no edema.  Neurological: He is alert and oriented to person, place, and time.  Skin: Skin is warm and dry.        SAFE - 11/29/16 1400      Situation   Admitting diagnosis CHF   Heart failure history New   Comorbidities Atrial fibillation;HTN;Sleep Apnea   Readmitted within 30 days No    Hospital admission within past 12 months Yes   number of hospital admissions 1   number of ED visits 2   Target Weight 477 lbs     Assessment   Lives alone No   Primary support person Wife    Mode of transportation personal car   Other services involved None   Home equipement Scale     Weight   Weighs self daily No   Scale provided No   Records on weight chart No     Resources   Has "Living better w/heart failure" book Yes   Has HF Zone tool Yes   Able to identify yellow zone signs/when to call MD Yes   Records zone daily Yes     Medications   Uses a pill box Yes   Who stocks the pill box self   Pill box checked this visit Yes   Pill box refilled this visit No   Difficulty obtaining medications No   Mail order medications No   Missed one or more doses of medications per week No     Nutrition   Patient receives meals on wheels No   Patient follows low sodium diet Yes   Has foods at home that meet the current recommended diet Yes   Patient follows low sugar/card diet No   Nutritional concerns/issues None     Activity Level   ADL's/Mobility Independent   How many feet can patient ambulate .25 mile   Typical activity level Moderate   Barriers Child care   Actvity tolerance: NYHA Class 1     Urine   Difficulty urinating No   Changes in urine None     Time spent with patient   Time spent with patient  40 Minutes        Future Appointments Date Time Provider Department Center  01/10/2017 2:40 PM Sherren Mocha, MD PCP-PCP [facility]  01/13/2017 9:30 AM [facility]  01/13/2017 10:15 AM [facility] LAB [facility] None  02/07/2017 8:00 PM [facility]-SLE[facility] ROOM 8 [facility]  02/17/2017 11:00 AM MC ECHO 1-BUZZ MC[facility]ECHOLAB [facility]  02/17/2017 12:00 PM Bensimhon, Bevelyn Buckles, MD MC-[facility] None  06/19/2017 9:00 AM Sherren Mocha, MD PCP-PCP [facility]   There were no vitals taken for this visit. Weight yesterday- 476 lbs Last visit weight- 480 lbs   Mark Baker was seen  at home today and reports feeling well. He said he has not had any more episodes of syncope and thinks his previous episodes were related to stress. He had two scheduled appointments today however had to cancel them due to a death in his family and was going  to be leaving town soon. He reports taking all of his medications as ordered and has not missed any doses. He denied SOB, H/A, and orthopnea. Medications were verified and physical exam was completed. Mark Baker denied needing any further assistance at this time.  Jacqualine Code, EMT 01/10/17  ACTION: Home visit completed Next visit planned for 2 weeks

## 2017-01-10 NOTE — Telephone Encounter (Signed)
Mark Baker at [facility] has been trying to contact pt for holter monitor pt has not called back to make appt ,01/10/2017 [facility] for pt to call and get scheduled for the monitor appt. no response from patient yet. stpegram  01/09/2017 [facility] for pt to call and schedule monitor appt. we have openings today. stpegram  01/08/2017 tried to call pt to get him scheduled for a state monitor appt. stpegram

## 2017-01-13 ENCOUNTER — Telehealth: Payer: Self-pay | Admitting: Family Medicine

## 2017-01-13 ENCOUNTER — Ambulatory Visit (HOSPITAL_COMMUNITY)
Admission: RE | Admit: 2017-01-13 | Discharge: 2017-01-13 | Disposition: A | Discharge status: Home / Self Care | Payer: Managed Care, Other (non HMO) | Source: Ambulatory Visit | Attending: Cardiology | Admitting: Cardiology

## 2017-01-13 ENCOUNTER — Telehealth (HOSPITAL_COMMUNITY): Payer: Self-pay | Admitting: Cardiology

## 2017-01-13 ENCOUNTER — Ambulatory Visit (INDEPENDENT_AMBULATORY_CARE_PROVIDER_SITE_OTHER): Payer: Managed Care, Other (non HMO) | Admitting: *Deleted

## 2017-01-13 DIAGNOSIS — I509 Heart failure, unspecified: Secondary | ICD-10-CM

## 2017-01-13 DIAGNOSIS — I4891 Unspecified atrial fibrillation: Secondary | ICD-10-CM

## 2017-01-13 DIAGNOSIS — I480 Paroxysmal atrial fibrillation: Secondary | ICD-10-CM

## 2017-01-13 DIAGNOSIS — I5022 Chronic systolic (congestive) heart failure: Secondary | ICD-10-CM

## 2017-01-13 DIAGNOSIS — Z5181 Encounter for therapeutic drug level monitoring: Secondary | ICD-10-CM | POA: Diagnosis not present

## 2017-01-13 LAB — BASIC METABOLIC PANEL
Anion gap: 11 (ref 5–15)
BUN: 15 mg/dL (ref 6–20)
CO2: 29 mmol/L (ref 22–32)
CREATININE: 1.54 mg/dL — AB (ref 0.61–1.24)
Calcium: 9.1 mg/dL (ref 8.9–10.3)
Chloride: 100 mmol/L — ABNORMAL LOW (ref 101–111)
GFR calc non Af Amer: 57 mL/min — ABNORMAL LOW (ref >60)
Glucose, Bld: 105 mg/dL — ABNORMAL HIGH (ref 65–99)
POTASSIUM: 3.3 mmol/L — AB (ref 3.5–5.1)
SODIUM: 140 mmol/L (ref 135–145)

## 2017-01-13 LAB — POCT INR: INR: 2.8

## 2017-01-13 MED ORDER — POTASSIUM CHLORIDE CRYS ER 20 MEQ PO TBCR: 20.0000 meq | EXTENDED_RELEASE_TABLET | Freq: Every day | ORAL | 3 refills | Status: DC

## 2017-01-13 NOTE — Addendum Note (Signed)
Addendum  created 01/13/17 1300 by Val Eagle, MD   Sign clinical note

## 2017-01-13 NOTE — Telephone Encounter (Signed)
-----   Message from Graciella Freer, PA-C sent at 01/13/2017 10:55 AM EDT ----- Please have take 40 meq of K once and then start on 20 meq daily. Recheck 10-14 days.   Make sure he is taking Cleda Daub and Losartan as directed.     Casimiro Needle [address] [place], PA-C 01/13/2017 10:55 AM

## 2017-01-13 NOTE — Progress Notes (Signed)
This encounter was created in error - please disregard.  This encounter was created in error - please disregard.

## 2017-01-13 NOTE — Telephone Encounter (Signed)
Patient aware. Patient voiced understanding, repeat labs 6/18 as requested RX sent to [facility]   Patient verified correct doses of losartan and spironolactone

## 2017-01-13 NOTE — Telephone Encounter (Signed)
Pt had a f/u OV sched w/ me on  which he cancelled/no-showed. Below were the notes I had made prior to his appt in order to expedite hia visit.  Mark Baker is a 36 y.o. male who presents to Primary Care at [facility] for a hospital follow-up from a syncopal episode which occurred 5/23, discharged 5/24. Pt was stressed and upset the morning of admission and developed intermittent chest discomfort lasting for 10 minutes at a times with no radiation, chest pressure or associated symptoms and suffered a syncopal episode. Pt was admitted overnight for chest pain rule out which it was with no EKG changes, negative troponins, normal telemetry. Pt was in sinus rhythm. instructed not to drive for 6 months & Dr. Randa Evens would arrange for event monitor. Pt was visited by the para-medicine EMT this morning with plan to recheck in 2 weeks. His weight was down 4 lbs from the week prior. He has a sleep study scheduled at the end of this month with an echocardiogram and has a cardiology appointment in 5 weeks. Episode was attributed to stress and he has had no further episodes.

## 2017-01-20 ENCOUNTER — Other Ambulatory Visit (HOSPITAL_COMMUNITY): Payer: Self-pay

## 2017-01-20 NOTE — Progress Notes (Signed)
Paramedicine Encounter    Patient ID: Mark Baker, male    DOB: [birth year], 36 y.o.   MRN: [id number]   Patient Care Team: Patient, No Pcp Per as PCP - General (General Practice)  Patient Active Problem List   Diagnosis Date Noted  . Syncope 01/01/2017  . Erectile dysfunction 12/25/2016  . Encounter for therapeutic drug monitoring 11/19/2016  . Paroxysmal atrial fibrillation (HCC)   . Chronic HFrEF (heart failure with reduced ejection fraction) (HCC) 07/05/2016  . Morbid obesity with body mass index (BMI) of 60.0 to 69.9 in adult [facility]) 07/05/2016  . CKD (chronic kidney disease), stage III 07/05/2016  . Hyperlipidemia 11/25/2012  . Prolonged QT interval 11/24/2012  . Healthcare maintenance 09/03/2012  . HTN (hypertension) 02/19/2012  . Sleep apnea 02/10/2012  . Morbid obesity (HCC) 02/10/2012    Current Outpatient Prescriptions:  .  amiodarone (PACERONE) 200 MG tablet, Take 1 tablet (200 mg total) by mouth 2 (two) times daily., Disp: 60 tablet, Rfl: 3 .  atorvastatin (LIPITOR) 40 MG tablet, Take 1 tablet (40 mg total) by mouth daily., Disp: 30 tablet, Rfl: 3 .  carvedilol (COREG) 3.125 MG tablet, Take 1 tablet (3.125 mg total) by mouth 2 (two) times daily., Disp: 60 tablet, Rfl: 3 .  digoxin (LANOXIN) 0.25 MG tablet, Take 1 tablet (0.25 mg total) by mouth daily., Disp: 30 tablet, Rfl: 6 .  hydrALAZINE (APRESOLINE) 50 MG tablet, Take 1 tablet (50 mg total) by mouth every 8 (eight) hours., Disp: 90 tablet, Rfl: 6 .  isosorbide mononitrate (IMDUR) 60 MG 24 hr tablet, Take 1 tablet (60 mg total) by mouth daily., Disp: 30 tablet, Rfl: 6 .  losartan (COZAAR) 25 MG tablet, Take 0.5 tablets (12.5 mg total) by mouth at bedtime., Disp: 15 tablet, Rfl: 3 .  potassium chloride SA (K-DUR,KLOR-CON) 20 MEQ tablet, Take 1 tablet (20 mEq total) by mouth daily., Disp: 90 tablet, Rfl: 3 .  sildenafil (VIAGRA) 100 MG tablet, Take 1 tablet (100 mg total) by mouth daily as needed for erectile  dysfunction., Disp: 5 tablet, Rfl: 3 .  spironolactone (ALDACTONE) 25 MG tablet, Take 1 tablet (25 mg total) by mouth daily., Disp: 30 tablet, Rfl: 3 .  torsemide (DEMADEX) 20 MG tablet, Take 40 mg (2 Tablets) in the AM and 20 mg (1 Tablet) in the PM (Patient taking differently: Take 20-40 mg by mouth 2 (two) times daily. Take 40 mg (2 Tablets) in the AM and 20 mg (1 Tablet) in the PM), Disp: 90 tablet, Rfl: 6 .  warfarin (COUMADIN) 7.5 MG tablet, Take 1 tablet (7.5 mg total) by mouth daily at 6 PM. (Patient taking differently: Take 3.75-7.5 mg by mouth daily at 6 PM. 1 tablet M/W/F and 1/2 tablet Tu/Th/Sat/Sun), Disp: 45 tablet, Rfl: 6 Allergies  Allergen Reactions  . Aspirin Hives, Itching and Rash     Social History   Social History  . Marital status: Married    Spouse name: N/A  . Number of children: N/A  . Years of education: N/A   Occupational History  . Not on file.   Social History Main Topics  . Smoking status: Never Smoker  . Smokeless tobacco: Never Used  . Alcohol use 0.0 oz/week     Comment: seldom.  . Drug use: No  . 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    Other Topics Concern  . Not on file   Social History Narrative  Lives in [place] with wife of 4 years.  Works for [employer] at [employer].  1 son and 2 daughters.     Denies cigarettes. Drank 1 beer last night 11/22/12. Denies drugs           Physical Exam  Constitutional: He is oriented to person, place, and time. He appears well-developed.  Neck: Normal range of motion.  Cardiovascular: Normal rate and regular rhythm.   Pulmonary/Chest: Effort normal and breath sounds normal. No respiratory distress. He has no wheezes. He has no rales.  Abdominal: Soft.  Musculoskeletal: Normal range of motion. He exhibits no edema.  Neurological: He is alert and oriented to person, place, and time.  Skin: Skin is warm and dry.        SAFE - 11/29/16 1400      Situation    Admitting diagnosis CHF   Heart failure history New   Comorbidities Atrial fibillation;HTN;Sleep Apnea   Readmitted within 30 days No   Hospital admission within past 12 months Yes   number of hospital admissions 1   number of ED visits 2   Target Weight 477 lbs     Assessment   Lives alone No   Primary support person Wife    Mode of transportation personal car   Other services involved None   Home equipement Scale     Weight   Weighs self daily No   Scale provided No   Records on weight chart No     Resources   Has "Living better w/heart failure" book Yes   Has HF Zone tool Yes   Able to identify yellow zone signs/when to call MD Yes   Records zone daily Yes     Medications   Uses a pill box Yes   Who stocks the pill box self   Pill box checked this visit Yes   Pill box refilled this visit No   Difficulty obtaining medications No   Mail order medications No   Missed one or more doses of medications per week No     Nutrition   Patient receives meals on wheels No   Patient follows low sodium diet Yes   Has foods at home that meet the current recommended diet Yes   Patient follows low sugar/card diet No   Nutritional concerns/issues None     Activity Level   ADL's/Mobility Independent   How many feet can patient ambulate .25 mile   Typical activity level Moderate   Barriers Child care   Actvity tolerance: NYHA Class 1     Urine   Difficulty urinating No   Changes in urine None     Time spent with patient   Time spent with patient  40 Minutes        Future Appointments Date Time Provider Department Center  01/27/2017 11:00 AM [facility] LAB [facility] None  01/30/2017 8:30 AM CVD[facility] MONITOR [facility]  02/07/2017 8:00 PM [facility] ROOM 8 [facility]EL MSD  02/10/2017 9:15 AM [facility]  02/17/2017 11:00 AM MC ECHO 1-BUZZ MC[facility]ECHOLAB [facility]  02/17/2017 12:00 PM Bensimhon, Bevelyn Buckles, MD MC-HVSC None  06/19/2017 9:00 AM  Sherren Mocha, MD PCP-PCP [facility]   BP 126/76 (BP Location: Right Arm, Patient Position: Sitting, Cuff Size: Large)   Pulse 76   Resp 16   Wt (!) 474 lb (215 kg)   SpO2 93%   BMI 60.86 kg/m  Weight yesterday- 472 lbs Last visit weight- 474 lbs  Mr  Bartling was seen at home today and reports feeling well. He denied SOB, H/A or dizziness and is still only using one pillow to sleep. His medications were verified and physical exam completed. He denied needing any further assistance.  Jacqualine Code, EMT 01/20/17  ACTION: Home visit completed Next visit planned for 2 weeks

## 2017-01-27 ENCOUNTER — Inpatient Hospital Stay (HOSPITAL_COMMUNITY): Admission: RE | Admit: 2017-01-27 | Payer: Managed Care, Other (non HMO) | Source: Ambulatory Visit

## 2017-01-28 ENCOUNTER — Other Ambulatory Visit (HOSPITAL_COMMUNITY): Payer: Managed Care, Other (non HMO)

## 2017-01-28 ENCOUNTER — Inpatient Hospital Stay (HOSPITAL_COMMUNITY): Admission: RE | Admit: 2017-01-28 | Payer: Managed Care, Other (non HMO) | Source: Ambulatory Visit

## 2017-02-03 ENCOUNTER — Ambulatory Visit (INDEPENDENT_AMBULATORY_CARE_PROVIDER_SITE_OTHER): Payer: Managed Care, Other (non HMO)

## 2017-02-03 ENCOUNTER — Ambulatory Visit (HOSPITAL_COMMUNITY)
Admission: RE | Admit: 2017-02-03 | Discharge: 2017-02-03 | Disposition: A | Discharge status: Home / Self Care | Payer: Managed Care, Other (non HMO) | Source: Ambulatory Visit | Attending: Cardiology | Admitting: Cardiology

## 2017-02-03 DIAGNOSIS — R55 Syncope and collapse: Secondary | ICD-10-CM | POA: Diagnosis not present

## 2017-02-03 DIAGNOSIS — I509 Heart failure, unspecified: Secondary | ICD-10-CM | POA: Insufficient documentation

## 2017-02-03 LAB — BASIC METABOLIC PANEL
ANION GAP: 9 (ref 5–15)
BUN: 13 mg/dL (ref 6–20)
CO2: 32 mmol/L (ref 22–32)
Calcium: 9 mg/dL (ref 8.9–10.3)
Chloride: 100 mmol/L — ABNORMAL LOW (ref 101–111)
Creatinine, Ser: 1.43 mg/dL — ABNORMAL HIGH (ref 0.61–1.24)
GFR calc non Af Amer: >60 mL/min (ref >60)
GLUCOSE: 113 mg/dL — AB (ref 65–99)
POTASSIUM: 3.8 mmol/L (ref 3.5–5.1)
Sodium: 141 mmol/L (ref 135–145)

## 2017-02-07 ENCOUNTER — Ambulatory Visit (HOSPITAL_BASED_OUTPATIENT_CLINIC_OR_DEPARTMENT_OTHER): Payer: Managed Care, Other (non HMO) | Attending: Internal Medicine

## 2017-02-11 ENCOUNTER — Other Ambulatory Visit (HOSPITAL_COMMUNITY): Payer: Self-pay | Admitting: *Deleted

## 2017-02-11 DIAGNOSIS — I5043 Acute on chronic combined systolic (congestive) and diastolic (congestive) heart failure: Secondary | ICD-10-CM

## 2017-02-11 DIAGNOSIS — I10 Essential (primary) hypertension: Secondary | ICD-10-CM

## 2017-02-11 MED ORDER — SPIRONOLACTONE 25 MG PO TABS: 25.0000 mg | ORAL_TABLET | Freq: Every day | ORAL | 3 refills | Status: DC

## 2017-02-11 MED ORDER — TORSEMIDE 20 MG PO TABS: ORAL_TABLET | ORAL | 6 refills | Status: DC

## 2017-02-11 MED ORDER — WARFARIN SODIUM 7.5 MG PO TABS: 7.5000 mg | ORAL_TABLET | Freq: Every day | ORAL | 6 refills | Status: DC

## 2017-02-11 MED ORDER — SILDENAFIL CITRATE 100 MG PO TABS: 100.0000 mg | ORAL_TABLET | Freq: Every day | ORAL | 3 refills | Status: DC | PRN

## 2017-02-11 MED ORDER — ATORVASTATIN CALCIUM 40 MG PO TABS: 40.0000 mg | ORAL_TABLET | Freq: Every day | ORAL | 3 refills | Status: DC

## 2017-02-11 MED ORDER — ISOSORBIDE MONONITRATE ER 60 MG PO TB24: 60.0000 mg | ORAL_TABLET | Freq: Every day | ORAL | 3 refills | Status: DC

## 2017-02-11 MED ORDER — AMIODARONE HCL 200 MG PO TABS: 200.0000 mg | ORAL_TABLET | Freq: Two times a day (BID) | ORAL | 3 refills | Status: DC

## 2017-02-11 MED ORDER — POTASSIUM CHLORIDE CRYS ER 20 MEQ PO TBCR: 20.0000 meq | EXTENDED_RELEASE_TABLET | Freq: Every day | ORAL | 3 refills | Status: DC

## 2017-02-11 MED ORDER — LOSARTAN POTASSIUM 25 MG PO TABS: 12.5000 mg | ORAL_TABLET | Freq: Every day | ORAL | 3 refills | Status: DC

## 2017-02-11 MED ORDER — DIGOXIN 250 MCG PO TABS: 0.2500 mg | ORAL_TABLET | Freq: Every day | ORAL | 3 refills | Status: DC

## 2017-02-14 ENCOUNTER — Other Ambulatory Visit (HOSPITAL_COMMUNITY): Payer: Self-pay

## 2017-02-14 NOTE — Progress Notes (Signed)
Paramedicine Encounter    Patient ID: Mark Baker, male    DOB: 1981-03-08, 36 y.o.   MRN: [id number]   Patient Care Team: Sherren Mocha, MD as PCP - General (Family Medicine)  Patient Active Problem List   Diagnosis Date Noted  . Syncope 01/01/2017  . Erectile dysfunction 12/25/2016  . Encounter for therapeutic drug monitoring 11/19/2016  . Paroxysmal atrial fibrillation (HCC)   . Chronic HFrEF (heart failure with reduced ejection fraction) (HCC) 07/05/2016  . Morbid obesity with body mass index (BMI) of 60.0 to 69.9 in adult [facility]) 07/05/2016  . CKD (chronic kidney disease), stage III 07/05/2016  . Hyperlipidemia 11/25/2012  . Prolonged QT interval 11/24/2012  . Healthcare maintenance 09/03/2012  . HTN (hypertension) 02/19/2012  . Sleep apnea 02/10/2012  . Morbid obesity (HCC) 02/10/2012    Current Outpatient Prescriptions:  .  amiodarone (PACERONE) 200 MG tablet, Take 1 tablet (200 mg total) by mouth 2 (two) times daily., Disp: 60 tablet, Rfl: 3 .  atorvastatin (LIPITOR) 40 MG tablet, Take 1 tablet (40 mg total) by mouth daily., Disp: 90 tablet, Rfl: 3 .  carvedilol (COREG) 3.125 MG tablet, Take 1 tablet (3.125 mg total) by mouth 2 (two) times daily., Disp: 60 tablet, Rfl: 3 .  digoxin (LANOXIN) 0.25 MG tablet, Take 1 tablet (0.25 mg total) by mouth daily., Disp: 90 tablet, Rfl: 3 .  hydrALAZINE (APRESOLINE) 50 MG tablet, Take 1 tablet (50 mg total) by mouth every 8 (eight) hours., Disp: 90 tablet, Rfl: 6 .  isosorbide mononitrate (IMDUR) 60 MG 24 hr tablet, Take 1 tablet (60 mg total) by mouth daily., Disp: 90 tablet, Rfl: 3 .  losartan (COZAAR) 25 MG tablet, Take 0.5 tablets (12.5 mg total) by mouth at bedtime., Disp: 45 tablet, Rfl: 3 .  potassium chloride SA (K-DUR,KLOR-CON) 20 MEQ tablet, Take 1 tablet (20 mEq total) by mouth daily., Disp: 90 tablet, Rfl: 3 .  sildenafil (VIAGRA) 100 MG tablet, Take 1 tablet (100 mg total) by mouth daily as needed for erectile dysfunction.,  Disp: 5 tablet, Rfl: 3 .  spironolactone (ALDACTONE) 25 MG tablet, Take 1 tablet (25 mg total) by mouth daily., Disp: 90 tablet, Rfl: 3 .  torsemide (DEMADEX) 20 MG tablet, Take 40 mg (2 Tablets) in the AM and 20 mg (1 Tablet) in the PM, Disp: 270 tablet, Rfl: 6 .  warfarin (COUMADIN) 7.5 MG tablet, Take 1 tablet (7.5 mg total) by mouth daily at 6 PM., Disp: 45 tablet, Rfl: 6 Allergies  Allergen Reactions  . Aspirin Hives, Itching and Rash     Social History   Social History  . Marital status: Married    Spouse name: N/A  . Number of children: N/A  . Years of education: N/A   Occupational History  . Not on file.   Social History Main Topics  . Smoking status: Never Smoker  . Smokeless tobacco: Never Used  . Alcohol use 0.0 oz/week     Comment: seldom.  . Drug use: No  . 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    Other Topics Concern  . Not on file   Social History Narrative   Lives in [place] with wife of 4 years.  Works for [employer] at [employer].  1 son and 2 daughters.     Denies cigarettes. Drank 1 beer last night 11/22/12. Denies drugs           Physical Exam  Future Appointments Date Time Provider Department Center  02/17/2017 11:00 AM MC ECHO 1-BUZZ [facility]ECHOLAB [facility]  02/17/2017 12:00 PM Bensimhon, Bevelyn Buckles, MD MC-[facility] None  06/19/2017 9:00 AM Sherren Mocha, MD PCP-PCP [facility]   BP 112/84 (BP Location: Right Arm, Patient Position: Sitting, Cuff Size: Large)   Pulse 80   Resp 16   Wt (!) 468 lb (212.3 kg)   SpO2 97%   BMI 60.09 kg/m  Weight yesterday- 473 lbs Last visit weight- 474 lbs  Mark Baker was seen at home today and reports feeling well. He denied having SOB, H/A or dizziness and has been maintaining a steady weight since I last saw him. He Has recently moved further in to [facility] and is in good spirits about his future. He advised that he is comfortable taking care of himself without paramedicine coming to see him.  I will be at his appointment on Monday and will confirm discharge with clinic staff.  Jacqualine Code, EMT 02/14/17  ACTION: Home visit completed Next visit planned for Monday at clininc appointment

## 2017-02-17 ENCOUNTER — Other Ambulatory Visit (HOSPITAL_COMMUNITY): Payer: Self-pay | Admitting: *Deleted

## 2017-02-17 ENCOUNTER — Encounter (HOSPITAL_COMMUNITY): Payer: Self-pay | Admitting: Internal Medicine

## 2017-02-17 ENCOUNTER — Ambulatory Visit (HOSPITAL_COMMUNITY)
Admission: RE | Admit: 2017-02-17 | Discharge: 2017-02-17 | Disposition: A | Discharge status: Home / Self Care | Payer: Managed Care, Other (non HMO) | Source: Ambulatory Visit | Attending: Internal Medicine | Admitting: Internal Medicine

## 2017-02-17 ENCOUNTER — Other Ambulatory Visit (HOSPITAL_COMMUNITY): Payer: Self-pay

## 2017-02-17 ENCOUNTER — Encounter (HOSPITAL_COMMUNITY): Payer: Self-pay | Admitting: *Deleted

## 2017-02-17 ENCOUNTER — Ambulatory Visit (HOSPITAL_BASED_OUTPATIENT_CLINIC_OR_DEPARTMENT_OTHER)
Admission: RE | Admit: 2017-02-17 | Discharge: 2017-02-17 | Disposition: A | Discharge status: Home / Self Care | Payer: Managed Care, Other (non HMO) | Source: Ambulatory Visit | Attending: Internal Medicine | Admitting: Internal Medicine

## 2017-02-17 VITALS — BP 152/96 | HR 87 | Wt >= 6400 oz

## 2017-02-17 DIAGNOSIS — E669 Obesity, unspecified: Secondary | ICD-10-CM | POA: Insufficient documentation

## 2017-02-17 DIAGNOSIS — I130 Hypertensive heart and chronic kidney disease with heart failure and stage 1 through stage 4 chronic kidney disease, or unspecified chronic kidney disease: Secondary | ICD-10-CM | POA: Insufficient documentation

## 2017-02-17 DIAGNOSIS — I480 Paroxysmal atrial fibrillation: Secondary | ICD-10-CM | POA: Insufficient documentation

## 2017-02-17 DIAGNOSIS — I5022 Chronic systolic (congestive) heart failure: Secondary | ICD-10-CM

## 2017-02-17 DIAGNOSIS — I428 Other cardiomyopathies: Secondary | ICD-10-CM | POA: Insufficient documentation

## 2017-02-17 DIAGNOSIS — G4733 Obstructive sleep apnea (adult) (pediatric): Secondary | ICD-10-CM | POA: Insufficient documentation

## 2017-02-17 DIAGNOSIS — Z7901 Long term (current) use of anticoagulants: Secondary | ICD-10-CM | POA: Insufficient documentation

## 2017-02-17 DIAGNOSIS — Z6841 Body Mass Index (BMI) 40.0 and over, adult: Secondary | ICD-10-CM | POA: Insufficient documentation

## 2017-02-17 DIAGNOSIS — I5042 Chronic combined systolic (congestive) and diastolic (congestive) heart failure: Secondary | ICD-10-CM

## 2017-02-17 DIAGNOSIS — R55 Syncope and collapse: Secondary | ICD-10-CM

## 2017-02-17 DIAGNOSIS — Z79899 Other long term (current) drug therapy: Secondary | ICD-10-CM | POA: Insufficient documentation

## 2017-02-17 DIAGNOSIS — Z8249 Family history of ischemic heart disease and other diseases of the circulatory system: Secondary | ICD-10-CM | POA: Insufficient documentation

## 2017-02-17 DIAGNOSIS — N183 Chronic kidney disease, stage 3 (moderate): Secondary | ICD-10-CM | POA: Insufficient documentation

## 2017-02-17 DIAGNOSIS — N529 Male erectile dysfunction, unspecified: Secondary | ICD-10-CM | POA: Insufficient documentation

## 2017-02-17 DIAGNOSIS — Z886 Allergy status to analgesic agent status: Secondary | ICD-10-CM | POA: Insufficient documentation

## 2017-02-17 MED ORDER — PERFLUTREN LIPID MICROSPHERE
1.0000 mL | INTRAVENOUS | Status: AC | PRN
  Administered 2017-02-17: 2 mL via INTRAVENOUS
  Filled 2017-02-17: qty 10

## 2017-02-17 MED ORDER — LOSARTAN POTASSIUM 25 MG PO TABS: 25.0000 mg | ORAL_TABLET | Freq: Every day | ORAL | 3 refills | Status: DC

## 2017-02-17 NOTE — Progress Notes (Signed)
  Echocardiogram 2D Echocardiogram has been performed.  Tye Savoy 02/17/2017, 12:23 PM

## 2017-02-17 NOTE — Progress Notes (Signed)
Paramedicine Encounter   Patient ID: Mark Baker , male,   DOB: 1981-02-23,35 y.o.,  MRN: [id number]   Met patient in clinic today with provider. Reports weight has been fluctuation between 465 and 477 pounds over the past month. Reports following low sodium diet and still going to the gym.Working on getting medications through a mail order service. Mark Baker wants to increase losartan. After speaking with Mark Baker again today, he feels better if we do not discharge him and scale back to 1 visit per month. Dr Haroldine Laws reports the echocardiogram appears to show improvement but wants to schedule a heart cath to gain more information.  Time spent with patient 60 minutes  Mark Baker, EMT-Paramedic 02/17/2017   ACTION: Next visit planned for 3 weeks

## 2017-02-17 NOTE — Patient Instructions (Signed)
INCREASE Losartan to 25 mg (1 Tablet) Once daily at bedtime  You have been scheduled for a Right & Left Heart Catheterization, please see attached instructions  Follow up in 3 Months, we will contact you to schedule appointment.

## 2017-02-17 NOTE — Progress Notes (Signed)
Advanced Heart Failure Clinic Note    Primary Care: No PCP  Primary Cardiologist: Dr. Gala Romney   HPI: Mark Baker is a 36 y.o. 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   Admitted 10/31/16-11/14/16 with atrial fibrillation RVR and low output (co ox 38%). Started on Amiodarone and milrinone. TEE on 11/07/16 - EF 20% RV severely HK Massive LA (7.7cm), Severely dilated RA, Normal AV, Severe central MR, Moderate TR (RVSP 65-70), Trivial PR.  He spontaneously converted to NSR. He diuresed 44 pounds, discharge weight was 477 pounds. Discharged on torsemide 40mg  daily. Renal function inhibited starting ARB/ARNI.   Admitted 01/01/17-01/02/17 with syncope, event was triggered by an argument at home. Outpatient monitor was suggested. He remained in NSR on telemetry overnight.   He presents today for HF follow up. Feeling well, weights at home 465-477. Still going to the gym a couple times a week, no SOB. Eating well, denies eating fast food, cooks at home. Drinking about 2-2.5 L a day. Taking all medications, recently had some issues with his pharmacy, had to transition to mail order so he has not taken all his medications today. No further syncope or presyncope. Using CPAP 5/7 nights, but takes it off during the night, not wearing it all night.    Past Medical History:  Diagnosis Date  . Asthma    childhood asthma no exacerbation since age 86   . Chronic combined systolic and diastolic CHF (congestive heart failure) (HCC)    a) ECHO (11/2012): EF 40-45%, RV fx difficult to see, nl size b) ECHO (05/2014): EF 20-25%, grade 2 DD, RV mildly dilated and sys fx mod reduced  . Hypertension   . NICM (nonischemic cardiomyopathy) (HCC)    a) (11/2012): normal coronaries  . OSA (obstructive sleep apnea)     Current Outpatient Prescriptions  Medication Sig Dispense Refill  . amiodarone  (PACERONE) 200 MG tablet Take 1 tablet (200 mg total) by mouth 2 (two) times daily. 60 tablet 3  . atorvastatin (LIPITOR) 40 MG tablet Take 1 tablet (40 mg total) by mouth daily. 90 tablet 3  . carvedilol (COREG) 3.125 MG tablet Take 1 tablet (3.125 mg total) by mouth 2 (two) times daily. 60 tablet 3  . digoxin (LANOXIN) 0.25 MG tablet Take 1 tablet (0.25 mg total) by mouth daily. 90 tablet 3  . hydrALAZINE (APRESOLINE) 50 MG tablet Take 1 tablet (50 mg total) by mouth every 8 (eight) hours. 90 tablet 6  . isosorbide mononitrate (IMDUR) 60 MG 24 hr tablet Take 1 tablet (60 mg total) by mouth daily. 90 tablet 3  . losartan (COZAAR) 25 MG tablet Take 0.5 tablets (12.5 mg total) by mouth at bedtime. 45 tablet 3  . potassium chloride SA (K-DUR,KLOR-CON) 20 MEQ tablet Take 1 tablet (20 mEq total) by mouth daily. 90 tablet 3  . sildenafil (VIAGRA) 100 MG tablet Take 1 tablet (100 mg total) by mouth daily as needed for erectile dysfunction. 5 tablet 3  . spironolactone (ALDACTONE) 25 MG tablet Take 1 tablet (25 mg total) by mouth daily. 90 tablet 3  . torsemide (DEMADEX) 20 MG tablet Take 40 mg (2 Tablets) in the AM and 20 mg (1 Tablet) in the PM 270 tablet 6  . warfarin (COUMADIN) 7.5 MG tablet Take 1 tablet (7.5 mg total) by mouth daily at 6 PM. 45 tablet 6   No current facility-administered medications for this  encounter.    Facility-Administered Medications Ordered in Other Encounters  Medication Dose Route Frequency Provider Last Rate Last Dose  . perflutren lipid microspheres (DEFINITY) IV suspension  1-10 mL Intravenous PRN Jovon Streetman, Bevelyn Buckles, MD   2 mL at 02/17/17 1147    Allergies  Allergen Reactions  . Aspirin Hives, Itching and Rash      Social History   Social History  . Marital status: Married    Spouse name: N/A  . Number of children: N/A  . Years of education: N/A   Occupational History  . Not on file.   Social History Main Topics  . Smoking status: Never Smoker  .  Smokeless tobacco: Never Used  . Alcohol use 0.0 oz/week     Comment: seldom.  . Drug use: No  . 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    Other Topics Concern  . Not on file   Social History Narrative   Lives in [place] with wife of 4 years.  Works for [employer] at [employer].  1 son and 2 daughters.     Denies cigarettes. Drank 1 beer last night 11/22/12. Denies drugs             Family History  Problem Relation Age of Onset  . Hypertension Father   . Heart failure Father        paternal side of family   . Hypertension Sister   . Stroke Maternal Grandmother   . Hypertension Unknown        maternal side of family   . Diabetes Neg Hx   . Hyperlipidemia Neg Hx     Vitals:   02/17/17 1220  BP: (!) 152/96  Pulse: 87  SpO2: 96%  Weight: (!) 470 lb 12 oz (213.5 kg)   Wt Readings from Last 3 Encounters:  02/17/17 (!) 470 lb 12 oz (213.5 kg)  02/14/17 (!) 468 lb (212.3 kg)  01/20/17 (!) 474 lb (215 kg)    PHYSICAL EXAM: General: Obese male, NAD. Walked into clinic without difficultly.  HEENT: Normal.  Neck: supple. Difficult to assess JVD due to body habitus. Carotids 2+ bilat; no bruits. No thyromegaly or nodule noted. Cor: PMI nondisplaced. Regular rate and rhythm. No M/R/G.  Lungs: Clear bilaterally. Normal effort.  Abdomen: Obese, soft, non tender, non distended. no HSM. No bruits or masses. Bowel sounds present.  Extremities: no cyanosis, clubbing, or rash. Trace pedal edema bilaterally Neuro: alert & orientedx3, cranial nerves grossly intact. moves all 4 extremities w/o difficulty. Affect pleasant   ASSESSMENT & PLAN: 1. Chronic combined systolic and diastolic CHF: Echo today EF 30-35%, RV not well visualized.  10/2016 EF 20-25% NICM, likely tachymediated although noncompliance may play a role.  - NYHA II. Volume status stable on exam although difficult to assess. Weight down 4 pounds from last visit.  - Continue torsemide 40 mg  in the am, 20 mg in the pm.  - Continue digoxin 0.25 mcg daily. Level ok in March 2018.  - Continue hydralazine 50 mg TID.  - Continue imdur 60 mg daily. ( Will not increase with viagra use, knows to avoid on days he takes) - Continue Spiro 25 mg daily - Continue Coreg 3.125mg  BID, patient refused increase in Coreg due to erectile dysfunction. - Entresto caused bad cough, refuses to re-challenge. Increase losartan to 25 mg daily.     2. Obesity: - Body mass index is 60.44 kg/m. -  Encouraged him to continue to exercise and increase activity.  - Encouraged smaller portion size.   3. OSA - Continue CPAP nightly.   4. CKD stage III - Creatinine stable.  - Baseline 1.3-1.5. Recent BMET stable.   5. PAF - EKG today  - Rate controlled on exam.  - This patients CHA2DS2-VASc Score and unadjusted Ischemic Stroke Rate (% per year) is equal to 2.2 % stroke rate/year from a score of 2 Above score calculated as 1 point each if present [CHF, HTN, DM, Vascular=MI/PAD/Aortic Plaque, Age if 65-74, or Male], 2 points each if present [Age > 75, or Stroke/TIA/TE] - Continue coumadin for anticoagulation.   6. Erectile dysfunction - Continue Viagra as needed. Pt aware, re-educated, and repeated back potential for interaction with imdur.   7. Syncope - 48 hour holter monitor ordered, he was not able to wear it due to contact dermatitis from the electrodes. He called and asked that he be sent the sensitive skin electrodes. He has yet to receive them. I have asked him to call the company again.  - Will also need RHC, syncope is worrisome for RV failure, severe PAH. Has OSA as well. Will set up R and L heart cath   Follow up in 3 months. R and L heart cath scheduled. He wishes to continue paramedicine.   Little Ishikawa, NP 02/17/17   Patient seen and examined with Suzzette Righter, NP. We discussed all aspects of the encounter. I agree with the assessment and plan as stated above.   Overall improved. I  reviewed echo personally today and feel EF 30-35% though limited images (Dr. Rubie Maid read as 20-25%). Volume status looks good. Maintaining NSR on amio. Given syncopal episode will need monitor. Will also do R/L cath to evaluate for [facility] and CAD. Stressed need for compliance with CPAP. No driving for 6 months.   [name], MD  12:37 AM

## 2017-02-20 ENCOUNTER — Encounter (HOSPITAL_COMMUNITY): Payer: Self-pay | Admitting: *Deleted

## 2017-02-20 ENCOUNTER — Telehealth (HOSPITAL_COMMUNITY): Payer: Self-pay | Admitting: *Deleted

## 2017-02-20 NOTE — Telephone Encounter (Signed)
Approval for r/l heart cath #[id number]

## 2017-02-20 NOTE — Progress Notes (Signed)
Received medical record request from [facility] clams on February 18, 2017.  Requested records faxed today to [phone].  Original request will be scanned to patient's electronic medical record.

## 2017-02-21 ENCOUNTER — Telehealth (HOSPITAL_COMMUNITY): Payer: Self-pay | Admitting: *Deleted

## 2017-02-21 ENCOUNTER — Other Ambulatory Visit (HOSPITAL_COMMUNITY): Payer: Self-pay | Admitting: *Deleted

## 2017-02-21 MED ORDER — AMIODARONE HCL 200 MG PO TABS: 200.0000 mg | ORAL_TABLET | Freq: Two times a day (BID) | ORAL | 1 refills | Status: DC

## 2017-02-21 MED ORDER — CARVEDILOL 3.125 MG PO TABS: 3.1250 mg | ORAL_TABLET | Freq: Two times a day (BID) | ORAL | 1 refills | Status: DC

## 2017-02-21 MED FILL — CARVEDILOL 3.125 MG TABLET: 3.125 | 34 days supply | Qty: 68 | Fill #0

## 2017-02-21 MED FILL — AMIODARONE HCL 200 MG TAB: 200 | 34 days supply | Qty: 68 | Fill #0

## 2017-02-21 NOTE — Telephone Encounter (Signed)
Patient called in and explained that he has had a lapse in his insurance and is current uninsured.  Patient stated his insurance should be reactivated in 30 days.  He is unable to afford his mediations and asked for assistance.  I have sent prescriptions for his amiodarone and carvedilol to [facility] outpatient pharmacy to be filled with the HF fund.  Patient is aware that he needs to let us know when his insurance as reactivated.

## 2017-02-26 ENCOUNTER — Telehealth (HOSPITAL_COMMUNITY): Payer: Self-pay | Admitting: *Deleted

## 2017-02-26 NOTE — Telephone Encounter (Signed)
Shanda Bumps, RN w/pt's insurance/disability company left VM stating pt's last note she got did not mention disability, she stated pt has a sedentary job and she needs to know if Dr Gala Romney supports him being out of work and if so for how long.  Per last OV note pt is pretty sick with several issues and his scheduled for cath on 7/31.  Returned call to Shanda Bumps and left her a detailed VM on her ID VM stating pt has multiple issues at this time and is undergoing further work so unable to work at this time call us back for further info

## 2017-03-07 ENCOUNTER — Other Ambulatory Visit (HOSPITAL_COMMUNITY): Payer: Self-pay

## 2017-03-07 ENCOUNTER — Encounter (HOSPITAL_COMMUNITY): Payer: Self-pay

## 2017-03-07 NOTE — Progress Notes (Addendum)
Paramedicine Encounter    Patient ID: Mark Baker, male    DOB: 1981-06-01, 36 y.o.   MRN: [id number]   Patient Care Team: Sherren Mocha, MD as PCP - General (Family Medicine)  Patient Active Problem List   Diagnosis Date Noted  . Syncope 01/01/2017  . Erectile dysfunction 12/25/2016  . Encounter for therapeutic drug monitoring 11/19/2016  . Paroxysmal atrial fibrillation (HCC)   . Chronic HFrEF (heart failure with reduced ejection fraction) (HCC) 07/05/2016  . Morbid obesity with body mass index (BMI) of 60.0 to 69.9 in adult [facility]) 07/05/2016  . CKD (chronic kidney disease), stage III 07/05/2016  . Hyperlipidemia 11/25/2012  . Prolonged QT interval 11/24/2012  . Healthcare maintenance 09/03/2012  . HTN (hypertension) 02/19/2012  . Sleep apnea 02/10/2012  . Morbid obesity (HCC) 02/10/2012    Current Outpatient Prescriptions:  .  amiodarone (PACERONE) 200 MG tablet, Take 1 tablet (200 mg total) by mouth 2 (two) times daily., Disp: 60 tablet, Rfl: 1 .  atorvastatin (LIPITOR) 40 MG tablet, Take 1 tablet (40 mg total) by mouth daily., Disp: 90 tablet, Rfl: 3 .  carvedilol (COREG) 3.125 MG tablet, Take 1 tablet (3.125 mg total) by mouth 2 (two) times daily., Disp: 60 tablet, Rfl: 1 .  digoxin (LANOXIN) 0.25 MG tablet, Take 1 tablet (0.25 mg total) by mouth daily., Disp: 90 tablet, Rfl: 3 .  hydrALAZINE (APRESOLINE) 50 MG tablet, Take 1 tablet (50 mg total) by mouth every 8 (eight) hours., Disp: 90 tablet, Rfl: 6 .  isosorbide mononitrate (IMDUR) 60 MG 24 hr tablet, Take 1 tablet (60 mg total) by mouth daily., Disp: 90 tablet, Rfl: 3 .  losartan (COZAAR) 25 MG tablet, Take 1 tablet (25 mg total) by mouth at bedtime., Disp: 90 tablet, Rfl: 3 .  potassium chloride SA (K-DUR,KLOR-CON) 20 MEQ tablet, Take 1 tablet (20 mEq total) by mouth daily., Disp: 90 tablet, Rfl: 3 .  sildenafil (VIAGRA) 100 MG tablet, Take 1 tablet (100 mg total) by mouth daily as needed for erectile dysfunction., Disp: 5  tablet, Rfl: 3 .  spironolactone (ALDACTONE) 25 MG tablet, Take 1 tablet (25 mg total) by mouth daily., Disp: 90 tablet, Rfl: 3 .  torsemide (DEMADEX) 20 MG tablet, Take 40 mg (2 Tablets) in the AM and 20 mg (1 Tablet) in the PM, Disp: 270 tablet, Rfl: 6 .  warfarin (COUMADIN) 7.5 MG tablet, Take 1 tablet (7.5 mg total) by mouth daily at 6 PM., Disp: 45 tablet, Rfl: 6 Allergies  Allergen Reactions  . Aspirin Hives, Itching and Rash     Social History   Social History  . Marital status: Married    Spouse name: N/A  . Number of children: N/A  . Years of education: N/A   Occupational History  . Not on file.   Social History Main Topics  . Smoking status: Never Smoker  . Smokeless tobacco: Never Used  . Alcohol use 0.0 oz/week     Comment: seldom.  . Drug use: No  . 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    Other Topics Concern  . Not on file   Social History Narrative   Lives in [place] with wife of 4 years.  Works for [employer] at [employer].  1 son and 2 daughters.     Denies cigarettes. Drank 1 beer last night 11/22/12. Denies drugs           Physical Exam  Constitutional: He is oriented to person, place, and time. He appears well-developed.  Neck: Normal range of motion.  Cardiovascular: Normal rate and regular rhythm.   Pulmonary/Chest: Breath sounds normal. No respiratory distress. He has no wheezes. He has no rales.  Abdominal: Soft. He exhibits no distension.  Musculoskeletal: Normal range of motion. He exhibits no edema.  Neurological: He is alert and oriented to person, place, and time.  Skin: Skin is warm.  Psychiatric: He has a normal mood and affect.        Future Appointments Date Time Provider Department Center  06/19/2017 9:00 AM Sherren Mocha, MD PCP-PCP [facility]   BP 130/84 (BP Location: Right Arm, Patient Position: Sitting, Cuff Size: Large)   Pulse 70   Resp 16   Wt (!) 467 lb 9.6 oz (212.1 kg)   SpO2 94%    BMI 60.04 kg/m  Weight yesterday-  Last visit weight-   [name] was seen at home today and reports having intermittent pain in the left side of his chest that radiates into his shoulder. He sad the pain only lasts for 10-15 minutes and then resolves. He stated he has a friend Joycelyn Rua) at [employer] who is a physician who he has reached out to during these episodes. This friend has told him that he should not take Excedrine Migraine "because os an interaction with the current medications" and directed him to take a extra Isosorbide or tylenol instead." Charlston is scheduled for a heart cath on Tuesday, July 31 and I told him that more answers will be available at that time regarding his chest pain.  [name] stated he has reached out to the HF clinic and not received any response regarding a note sent to his disability insurer. The nurse with disability company stated that his note did not show that he is on driving restrictions or that he has been passing out intermittently. He is currently not getting paid for being out of work and would like to know if another note could be sent to the disability insurer.   [name] has all of his medications at present but has concerns over obtaining Viagra, hydralazine, carvedilol and amiodarone next month. I will contact the clinic regarding have these medications sent to the outpatient pharmacy and covered via the heart failure fund.   **Addendum was opened to complete "Physical Exam"**  Jacqualine Code, EMT 03/07/17  ACTION: Home visit completed Next visit planned for 2 weeks

## 2017-03-07 NOTE — Progress Notes (Signed)
Patient reported to [facility] with Paramedicine that his recent disability claim submitted by our office states he could return to work and patient is concerned that he is not able to return at this time, and also still has driving restrictions. 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  Ave Filter, RN

## 2017-03-10 ENCOUNTER — Telehealth (HOSPITAL_COMMUNITY): Payer: Self-pay | Admitting: *Deleted

## 2017-03-10 NOTE — Telephone Encounter (Signed)
Last documentation submitted on July 12th to [place] claims did not release patient to return to work but stated we would reevaluate at his next office visit.  I called [place] claims and verified they did received this information.

## 2017-03-10 NOTE — Telephone Encounter (Signed)
-----   Message from Chyrl Civatte, RN sent at 03/07/2017 12:25 PM EDT ----- Regarding: Disability and driving restrictions Patient reported to [facility] with Paramedicine that his recent disability claim submitted by our office states he could return to work and patient is concerned that he is not able to return at this time, and also still has driving restrictions that prevent him from being able to drive to and from work.

## 2017-03-18 ENCOUNTER — Ambulatory Visit (HOSPITAL_COMMUNITY)
Admission: RE | Admit: 2017-03-18 | Discharge: 2017-03-18 | Disposition: A | Discharge status: Home / Self Care | Payer: Managed Care, Other (non HMO) | Source: Ambulatory Visit | Attending: Internal Medicine | Admitting: Internal Medicine

## 2017-03-18 ENCOUNTER — Encounter (HOSPITAL_COMMUNITY)
Admission: RE | Disposition: A | Discharge status: Home / Self Care | Payer: Self-pay | Source: Ambulatory Visit | Attending: Internal Medicine

## 2017-03-18 ENCOUNTER — Encounter (HOSPITAL_COMMUNITY): Payer: Self-pay | Admitting: Internal Medicine

## 2017-03-18 DIAGNOSIS — I5022 Chronic systolic (congestive) heart failure: Secondary | ICD-10-CM

## 2017-03-18 DIAGNOSIS — I272 Pulmonary hypertension, unspecified: Secondary | ICD-10-CM | POA: Insufficient documentation

## 2017-03-18 DIAGNOSIS — N183 Chronic kidney disease, stage 3 (moderate): Secondary | ICD-10-CM | POA: Insufficient documentation

## 2017-03-18 DIAGNOSIS — I480 Paroxysmal atrial fibrillation: Secondary | ICD-10-CM | POA: Insufficient documentation

## 2017-03-18 DIAGNOSIS — R55 Syncope and collapse: Secondary | ICD-10-CM | POA: Insufficient documentation

## 2017-03-18 DIAGNOSIS — N529 Male erectile dysfunction, unspecified: Secondary | ICD-10-CM | POA: Insufficient documentation

## 2017-03-18 DIAGNOSIS — J45909 Unspecified asthma, uncomplicated: Secondary | ICD-10-CM | POA: Insufficient documentation

## 2017-03-18 DIAGNOSIS — I2510 Atherosclerotic heart disease of native coronary artery without angina pectoris: Secondary | ICD-10-CM | POA: Insufficient documentation

## 2017-03-18 DIAGNOSIS — Z8249 Family history of ischemic heart disease and other diseases of the circulatory system: Secondary | ICD-10-CM | POA: Insufficient documentation

## 2017-03-18 DIAGNOSIS — E669 Obesity, unspecified: Secondary | ICD-10-CM | POA: Insufficient documentation

## 2017-03-18 DIAGNOSIS — G4733 Obstructive sleep apnea (adult) (pediatric): Secondary | ICD-10-CM | POA: Insufficient documentation

## 2017-03-18 DIAGNOSIS — Z6841 Body Mass Index (BMI) 40.0 and over, adult: Secondary | ICD-10-CM | POA: Insufficient documentation

## 2017-03-18 DIAGNOSIS — I130 Hypertensive heart and chronic kidney disease with heart failure and stage 1 through stage 4 chronic kidney disease, or unspecified chronic kidney disease: Secondary | ICD-10-CM | POA: Insufficient documentation

## 2017-03-18 DIAGNOSIS — Z9114 Patient's other noncompliance with medication regimen: Secondary | ICD-10-CM | POA: Insufficient documentation

## 2017-03-18 DIAGNOSIS — Z7901 Long term (current) use of anticoagulants: Secondary | ICD-10-CM | POA: Insufficient documentation

## 2017-03-18 DIAGNOSIS — I428 Other cardiomyopathies: Secondary | ICD-10-CM | POA: Insufficient documentation

## 2017-03-18 DIAGNOSIS — I5042 Chronic combined systolic (congestive) and diastolic (congestive) heart failure: Secondary | ICD-10-CM

## 2017-03-18 HISTORY — PX: RIGHT/LEFT HEART CATH AND CORONARY ANGIOGRAPHY: CATH118266

## 2017-03-18 LAB — CBC
HEMATOCRIT: 41.4 % (ref 39.0–52.0)
HEMOGLOBIN: 13.3 g/dL (ref 13.0–17.0)
MCH: 29 pg (ref 26.0–34.0)
MCHC: 32.1 g/dL (ref 30.0–36.0)
MCV: 90.2 fL (ref 78.0–100.0)
Platelets: 207 10*3/uL (ref 150–400)
RBC: 4.59 MIL/uL (ref 4.22–5.81)
RDW: 12.3 % (ref 11.5–15.5)
WBC: 8.4 10*3/uL (ref 4.0–10.5)

## 2017-03-18 LAB — POCT I-STAT 3, VENOUS BLOOD GAS (G3P V)
ACID-BASE EXCESS: 7 mmol/L — AB (ref 0.0–2.0)
ACID-BASE EXCESS: 9 mmol/L — AB (ref 0.0–2.0)
Bicarbonate: 34.2 mmol/L — ABNORMAL HIGH (ref 20.0–28.0)
Bicarbonate: 35.9 mmol/L — ABNORMAL HIGH (ref 20.0–28.0)
O2 SAT: 65 %
O2 SAT: 66 %
PCO2 VEN: 57.4 mmHg (ref 44.0–60.0)
PO2 VEN: 35 mmHg (ref 32.0–45.0)
PO2 VEN: 36 mmHg (ref 32.0–45.0)
TCO2: 38 mmol/L (ref 0–100)
TCO2: 36 mmol/L (ref 0–100)
pCO2, Ven: 57.2 mmHg (ref 44.0–60.0)
pH, Ven: 7.384 (ref 7.250–7.430)
pH, Ven: 7.404 (ref 7.250–7.430)

## 2017-03-18 LAB — POCT I-STAT 3, ART BLOOD GAS (G3+)
ACID-BASE EXCESS: 4 mmol/L — AB (ref 0.0–2.0)
BICARBONATE: 30.6 mmol/L — AB (ref 20.0–28.0)
O2 SAT: 88 %
PH ART: 7.385 (ref 7.350–7.450)
TCO2: 32 mmol/L (ref 0–100)
pCO2 arterial: 51 mmHg — ABNORMAL HIGH (ref 32.0–48.0)
pO2, Arterial: 56 mmHg — ABNORMAL LOW (ref 83.0–108.0)

## 2017-03-18 LAB — PROTIME-INR
INR: 1.02
Prothrombin Time: 13.4 seconds (ref 11.4–15.2)

## 2017-03-18 LAB — BASIC METABOLIC PANEL
ANION GAP: 10 (ref 5–15)
BUN: 12 mg/dL (ref 6–20)
CALCIUM: 8.7 mg/dL — AB (ref 8.9–10.3)
CHLORIDE: 100 mmol/L — AB (ref 101–111)
CO2: 29 mmol/L (ref 22–32)
Creatinine, Ser: 1.44 mg/dL — ABNORMAL HIGH (ref 0.61–1.24)
GFR calc Af Amer: >60 mL/min (ref >60)
GFR calc non Af Amer: >60 mL/min (ref >60)
GLUCOSE: 101 mg/dL — AB (ref 65–99)
POTASSIUM: 3.1 mmol/L — AB (ref 3.5–5.1)
Sodium: 139 mmol/L (ref 135–145)

## 2017-03-18 SURGERY — RIGHT/LEFT HEART CATH AND CORONARY ANGIOGRAPHY
Anesthesia: LOCAL

## 2017-03-18 MED ORDER — FENTANYL CITRATE (PF) 100 MCG/2ML IJ SOLN
INTRAMUSCULAR | Status: AC
  Filled 2017-03-18: qty 2

## 2017-03-18 MED ORDER — ACETAMINOPHEN 325 MG PO TABS: 650.0000 mg | ORAL_TABLET | ORAL | Status: DC | PRN

## 2017-03-18 MED ORDER — SODIUM CHLORIDE 0.9% FLUSH: 3.0000 mL | Freq: Two times a day (BID) | INTRAVENOUS | Status: DC

## 2017-03-18 MED ORDER — LIDOCAINE HCL (PF) 1 % IJ SOLN
INTRAMUSCULAR | Status: DC | PRN
  Administered 2017-03-18 (×2): 3 mL

## 2017-03-18 MED ORDER — VERAPAMIL HCL 2.5 MG/ML IV SOLN
INTRAVENOUS | Status: AC
  Filled 2017-03-18: qty 2

## 2017-03-18 MED ORDER — POTASSIUM CHLORIDE 20 MEQ/15ML (10%) PO SOLN
ORAL | Status: AC
  Filled 2017-03-18: qty 30

## 2017-03-18 MED ORDER — IOPAMIDOL (ISOVUE-370) INJECTION 76%
INTRAVENOUS | Status: AC
  Filled 2017-03-18: qty 100

## 2017-03-18 MED ORDER — IOPAMIDOL (ISOVUE-370) INJECTION 76%
INTRAVENOUS | Status: DC | PRN
  Administered 2017-03-18: 125 mL via INTRAVENOUS

## 2017-03-18 MED ORDER — ASPIRIN 81 MG PO CHEW: 81.0000 mg | CHEWABLE_TABLET | ORAL | Status: DC

## 2017-03-18 MED ORDER — HEPARIN (PORCINE) IN NACL 2-0.9 UNIT/ML-% IJ SOLN
INTRAMUSCULAR | Status: AC | PRN
Start: 2017-03-18 — End: 2017-03-18
  Administered 2017-03-18: 1000 mL

## 2017-03-18 MED ORDER — IOPAMIDOL (ISOVUE-370) INJECTION 76%
INTRAVENOUS | Status: AC
  Filled 2017-03-18: qty 50

## 2017-03-18 MED ORDER — VERAPAMIL HCL 2.5 MG/ML IV SOLN
INTRAVENOUS | Status: DC | PRN
  Administered 2017-03-18: 3 mL via INTRA_ARTERIAL

## 2017-03-18 MED ORDER — POTASSIUM CHLORIDE CRYS ER 20 MEQ PO TBCR
40.0000 meq | EXTENDED_RELEASE_TABLET | Freq: Once | ORAL | Status: AC
  Administered 2017-03-18: 40 meq via ORAL

## 2017-03-18 MED ORDER — LIDOCAINE HCL 1 % IJ SOLN
INTRAMUSCULAR | Status: AC
  Filled 2017-03-18: qty 20

## 2017-03-18 MED ORDER — SODIUM CHLORIDE 0.9 % IV SOLN
INTRAVENOUS | Status: DC
  Administered 2017-03-18: 07:00:00 via INTRAVENOUS

## 2017-03-18 MED ORDER — HEPARIN SODIUM (PORCINE) 1000 UNIT/ML IJ SOLN
INTRAMUSCULAR | Status: DC | PRN
  Administered 2017-03-18: 4000 [IU] via INTRAVENOUS
  Administered 2017-03-18: 5000 [IU] via INTRAVENOUS

## 2017-03-18 MED ORDER — SODIUM CHLORIDE 0.9 % IV SOLN: 250.0000 mL | INTRAVENOUS | Status: DC | PRN

## 2017-03-18 MED ORDER — HEPARIN SODIUM (PORCINE) 1000 UNIT/ML IJ SOLN
INTRAMUSCULAR | Status: AC
  Filled 2017-03-18: qty 1

## 2017-03-18 MED ORDER — ONDANSETRON HCL 4 MG/2ML IJ SOLN: 4.0000 mg | Freq: Four times a day (QID) | INTRAMUSCULAR | Status: DC | PRN

## 2017-03-18 MED ORDER — SODIUM CHLORIDE 0.9 % IV SOLN
250.0000 mL | INTRAVENOUS | Status: DC | PRN
Start: 2017-03-18 — End: 2017-03-18

## 2017-03-18 MED ORDER — MIDAZOLAM HCL 2 MG/2ML IJ SOLN
INTRAMUSCULAR | Status: AC
  Filled 2017-03-18: qty 2

## 2017-03-18 MED ORDER — MIDAZOLAM HCL 2 MG/2ML IJ SOLN
INTRAMUSCULAR | Status: DC | PRN
Start: 2017-03-18 — End: 2017-03-18
  Administered 2017-03-18 (×2): 1 mg via INTRAVENOUS

## 2017-03-18 MED ORDER — SODIUM CHLORIDE 0.9 % IV SOLN: INTRAVENOUS | Status: AC

## 2017-03-18 MED ORDER — SODIUM CHLORIDE 0.9% FLUSH: 3.0000 mL | INTRAVENOUS | Status: DC | PRN

## 2017-03-18 MED ORDER — FENTANYL CITRATE (PF) 100 MCG/2ML IJ SOLN
INTRAMUSCULAR | Status: DC | PRN
  Administered 2017-03-18: 25 ug via INTRAVENOUS

## 2017-03-18 SURGICAL SUPPLY — 16 items
CATH BALLN WEDGE 5F 110CM (CATHETERS) ×2 IMPLANT
CATH EXPO 5FR FR4 (CATHETERS) ×2 IMPLANT
CATH INFINITI 5 FR JL3.5 (CATHETERS) ×2 IMPLANT
CATH INFINITI 5FR ANG PIGTAIL (CATHETERS) ×2 IMPLANT
DEVICE RAD COMP TR BAND LRG (VASCULAR PRODUCTS) ×2 IMPLANT
GLIDESHEATH SLEND SS 6F .021 (SHEATH) ×2 IMPLANT
GUIDEWIRE .025 260CM (WIRE) ×2 IMPLANT
GUIDEWIRE INQWIRE 1.5J.035X260 (WIRE) ×1 IMPLANT
INQWIRE 1.5J .035X260CM (WIRE) ×2
KIT HEART LEFT (KITS) ×2 IMPLANT
PACK CARDIAC CATHETERIZATION (CUSTOM PROCEDURE TRAY) ×2 IMPLANT
SHEATH GLIDE SLENDER 4/5FR (SHEATH) ×2 IMPLANT
SYR MEDRAD MARK V 150ML (SYRINGE) ×2 IMPLANT
TRANSDUCER W/STOPCOCK (MISCELLANEOUS) ×2 IMPLANT
TUBING CIL FLEX 10 FLL-RA (TUBING) ×2 IMPLANT
WIRE EMERALD 3MM-J .025X260CM (WIRE) ×2 IMPLANT

## 2017-03-18 NOTE — Discharge Instructions (Signed)

## 2017-03-18 NOTE — H&P (View-Only) (Signed)
Advanced Heart Failure Clinic Note    Primary Care: No PCP  Primary Cardiologist: Dr. Gala Romney   HPI: Mark Baker is a 36 y.o. 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   Admitted 10/31/16-11/14/16 with atrial fibrillation RVR and low output (co ox 38%). Started on Amiodarone and milrinone. TEE on 11/07/16 - EF 20% RV severely HK Massive LA (7.7cm), Severely dilated RA, Normal AV, Severe central MR, Moderate TR (RVSP 65-70), Trivial PR.  He spontaneously converted to NSR. He diuresed 44 pounds, discharge weight was 477 pounds. Discharged on torsemide 40mg  daily. Renal function inhibited starting ARB/ARNI.   Admitted 01/01/17-01/02/17 with syncope, event was triggered by an argument at home. Outpatient monitor was suggested. He remained in NSR on telemetry overnight.   He presents today for HF follow up. Feeling well, weights at home 465-477. Still going to the gym a couple times a week, no SOB. Eating well, denies eating fast food, cooks at home. Drinking about 2-2.5 L a day. Taking all medications, recently had some issues with his pharmacy, had to transition to mail order so he has not taken all his medications today. No further syncope or presyncope. Using CPAP 5/7 nights, but takes it off during the night, not wearing it all night.    Past Medical History:  Diagnosis Date  . Asthma    childhood asthma no exacerbation since age 86   . Chronic combined systolic and diastolic CHF (congestive heart failure) (HCC)    a) ECHO (11/2012): EF 40-45%, RV fx difficult to see, nl size b) ECHO (05/2014): EF 20-25%, grade 2 DD, RV mildly dilated and sys fx mod reduced  . Hypertension   . NICM (nonischemic cardiomyopathy) (HCC)    a) (11/2012): normal coronaries  . OSA (obstructive sleep apnea)     Current Outpatient Prescriptions  Medication Sig Dispense Refill  . amiodarone  (PACERONE) 200 MG tablet Take 1 tablet (200 mg total) by mouth 2 (two) times daily. 60 tablet 3  . atorvastatin (LIPITOR) 40 MG tablet Take 1 tablet (40 mg total) by mouth daily. 90 tablet 3  . carvedilol (COREG) 3.125 MG tablet Take 1 tablet (3.125 mg total) by mouth 2 (two) times daily. 60 tablet 3  . digoxin (LANOXIN) 0.25 MG tablet Take 1 tablet (0.25 mg total) by mouth daily. 90 tablet 3  . hydrALAZINE (APRESOLINE) 50 MG tablet Take 1 tablet (50 mg total) by mouth every 8 (eight) hours. 90 tablet 6  . isosorbide mononitrate (IMDUR) 60 MG 24 hr tablet Take 1 tablet (60 mg total) by mouth daily. 90 tablet 3  . losartan (COZAAR) 25 MG tablet Take 0.5 tablets (12.5 mg total) by mouth at bedtime. 45 tablet 3  . potassium chloride SA (K-DUR,KLOR-CON) 20 MEQ tablet Take 1 tablet (20 mEq total) by mouth daily. 90 tablet 3  . sildenafil (VIAGRA) 100 MG tablet Take 1 tablet (100 mg total) by mouth daily as needed for erectile dysfunction. 5 tablet 3  . spironolactone (ALDACTONE) 25 MG tablet Take 1 tablet (25 mg total) by mouth daily. 90 tablet 3  . torsemide (DEMADEX) 20 MG tablet Take 40 mg (2 Tablets) in the AM and 20 mg (1 Tablet) in the PM 270 tablet 6  . warfarin (COUMADIN) 7.5 MG tablet Take 1 tablet (7.5 mg total) by mouth daily at 6 PM. 45 tablet 6   No current facility-administered medications for this  encounter.    Facility-Administered Medications Ordered in Other Encounters  Medication Dose Route Frequency Provider Last Rate Last Dose  . perflutren lipid microspheres (DEFINITY) IV suspension  1-10 mL Intravenous PRN Bensimhon, Bevelyn Buckles, MD   2 mL at 02/17/17 1147    Allergies  Allergen Reactions  . Aspirin Hives, Itching and Rash      Social History   Social History  . Marital status: Married    Spouse name: N/A  . Number of children: N/A  . Years of education: N/A   Occupational History  . Not on file.   Social History Main Topics  . Smoking status: Never Smoker  .  Smokeless tobacco: Never Used  . Alcohol use 0.0 oz/week     Comment: seldom.  . Drug use: No  . 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    Other Topics Concern  . Not on file   Social History Narrative   Lives in [place] with wife of 4 years.  Works for [employer] at [employer].  1 son and 2 daughters.     Denies cigarettes. Drank 1 beer last night 11/22/12. Denies drugs             Family History  Problem Relation Age of Onset  . Hypertension Father   . Heart failure Father        paternal side of family   . Hypertension Sister   . Stroke Maternal Grandmother   . Hypertension Unknown        maternal side of family   . Diabetes Neg Hx   . Hyperlipidemia Neg Hx     Vitals:   02/17/17 1220  BP: (!) 152/96  Pulse: 87  SpO2: 96%  Weight: (!) 470 lb 12 oz (213.5 kg)   Wt Readings from Last 3 Encounters:  02/17/17 (!) 470 lb 12 oz (213.5 kg)  02/14/17 (!) 468 lb (212.3 kg)  01/20/17 (!) 474 lb (215 kg)    PHYSICAL EXAM: General: Obese male, NAD. Walked into clinic without difficultly.  HEENT: Normal.  Neck: supple. Difficult to assess JVD due to body habitus. Carotids 2+ bilat; no bruits. No thyromegaly or nodule noted. Cor: PMI nondisplaced. Regular rate and rhythm. No M/R/G.  Lungs: Clear bilaterally. Normal effort.  Abdomen: Obese, soft, non tender, non distended. no HSM. No bruits or masses. Bowel sounds present.  Extremities: no cyanosis, clubbing, or rash. Trace pedal edema bilaterally Neuro: alert & orientedx3, cranial nerves grossly intact. moves all 4 extremities w/o difficulty. Affect pleasant   ASSESSMENT & PLAN: 1. Chronic combined systolic and diastolic CHF: Echo today EF 30-35%, RV not well visualized.  10/2016 EF 20-25% NICM, likely tachymediated although noncompliance may play a role.  - NYHA II. Volume status stable on exam although difficult to assess. Weight down 4 pounds from last visit.  - Continue torsemide 40 mg  in the am, 20 mg in the pm.  - Continue digoxin 0.25 mcg daily. Level ok in March 2018.  - Continue hydralazine 50 mg TID.  - Continue imdur 60 mg daily. ( Will not increase with viagra use, knows to avoid on days he takes) - Continue Spiro 25 mg daily - Continue Coreg 3.125mg  BID, patient refused increase in Coreg due to erectile dysfunction. - Entresto caused bad cough, refuses to re-challenge. Increase losartan to 25 mg daily.     2. Obesity: - Body mass index is 60.44 kg/m. -  Encouraged him to continue to exercise and increase activity.  - Encouraged smaller portion size.   3. OSA - Continue CPAP nightly.   4. CKD stage III - Creatinine stable.  - Baseline 1.3-1.5. Recent BMET stable.   5. PAF - EKG today  - Rate controlled on exam.  - This patients CHA2DS2-VASc Score and unadjusted Ischemic Stroke Rate (% per year) is equal to 2.2 % stroke rate/year from a score of 2 Above score calculated as 1 point each if present [CHF, HTN, DM, Vascular=MI/PAD/Aortic Plaque, Age if 65-74, or Male], 2 points each if present [Age > 75, or Stroke/TIA/TE] - Continue coumadin for anticoagulation.   6. Erectile dysfunction - Continue Viagra as needed. Pt aware, re-educated, and repeated back potential for interaction with imdur.   7. Syncope - 48 hour holter monitor ordered, he was not able to wear it due to contact dermatitis from the electrodes. He called and asked that he be sent the sensitive skin electrodes. He has yet to receive them. I have asked him to call the company again.  - Will also need RHC, syncope is worrisome for RV failure, severe PAH. Has OSA as well. Will set up R and L heart cath   Follow up in 3 months. R and L heart cath scheduled. He wishes to continue paramedicine.   Little Ishikawa, NP 02/17/17   Patient seen and examined with Suzzette Righter, NP. We discussed all aspects of the encounter. I agree with the assessment and plan as stated above.   Overall improved. I  reviewed echo personally today and feel EF 30-35% though limited images (Dr. Rubie Maid read as 20-25%). Volume status looks good. Maintaining NSR on amio. Given syncopal episode will need monitor. Will also do R/L cath to evaluate for [facility] and CAD. Stressed need for compliance with CPAP. No driving for 6 months.   [name], MD  12:37 AM

## 2017-03-18 NOTE — Interval H&P Note (Signed)
History and Physical Interval Note:  03/18/2017 8:00 AM  Mark Baker  has presented today for surgery, with the diagnosis of chf  The various methods of treatment have been discussed with the patient and family. After consideration of risks, benefits and other options for treatment, the patient has consented to  Procedure(s): Right/Left Heart Cath and Coronary Angiography (N/A) and possible coronary angioplasty as a surgical intervention .  The patient's history has been reviewed, patient examined, no change in status, stable for surgery.  I have reviewed the patient's chart and labs.  Questions were answered to the patient's satisfaction.     Jaylena Holloway, Reuel Boom

## 2017-03-24 ENCOUNTER — Telehealth (HOSPITAL_COMMUNITY): Payer: Self-pay

## 2017-03-24 NOTE — Telephone Encounter (Signed)
I called Mark Baker today to schedule an appointment. He was unable to meet today and stated that he was feeling "the best he has in a long time." He said he was planning on going school shopping for the kids and fill out paperwork to return to work. I will follow up again next week and then consider discharge from paramedicine program.

## 2017-03-25 ENCOUNTER — Encounter (HOSPITAL_COMMUNITY): Payer: Self-pay | Admitting: *Deleted

## 2017-03-26 ENCOUNTER — Telehealth (HOSPITAL_COMMUNITY): Payer: Self-pay | Admitting: *Deleted

## 2017-03-26 NOTE — Telephone Encounter (Signed)
LATE ENTRY:  Pt called requesting to return to work, he states he is out of disability time and is not getting paid so he would like to return to work, he does not do anything physical, he works in the call center for [employer].  Discussed w/Dr Foye Spurling, he is ok w/pt returning to work.  Letter completed and signed by Dr Gala Romney, letter faxed to [facility] at [phone], pt aware, copy left at front desk for him to p/u

## 2017-03-31 ENCOUNTER — Telehealth (HOSPITAL_COMMUNITY): Payer: Self-pay | Admitting: Cardiology

## 2017-03-31 MED ORDER — AMIODARONE HCL 200 MG PO TABS
200.0000 mg | ORAL_TABLET | Freq: Two times a day (BID) | ORAL | 0 refills | Status: DC
Start: 2017-03-31 — End: 2017-06-13

## 2017-03-31 MED ORDER — CARVEDILOL 3.125 MG PO TABS: 3.1250 mg | ORAL_TABLET | Freq: Two times a day (BID) | ORAL | 0 refills | Status: DC

## 2017-03-31 MED FILL — CARVEDILOL 3.125 MG TABLET: 3.125 | 34 days supply | Qty: 68 | Fill #0

## 2017-03-31 MED FILL — AMIODARONE HCL 200 MG TAB: 200 | 34 days supply | Qty: 68 | Fill #0

## 2017-03-31 NOTE — Telephone Encounter (Signed)
PATIENT REPORTS HE WAS JUST RELEASED BY HF CLINIC TO RETURN TO WORK, BENEFITS WILL NOT RESTART UNTIL 9*2018, WILL NEED MEDICATION ASSISTANCE UNTIL MEDICATION COVERAGE IS ACTIVE   ADVISED WE CAN ASSIST WITH HF MEDS REQUESTED: AMIODARONE, COREG, AND VIAGRA  NO ASSISTANCE AVAILABLE FOR THE VIAGRA, HF FUND FOR OTHER MEDS- PT AWARE AND VOICED UNDERSTANDING

## 2017-04-03 ENCOUNTER — Other Ambulatory Visit: Payer: Self-pay | Admitting: Family Medicine

## 2017-04-03 ENCOUNTER — Ambulatory Visit (INDEPENDENT_AMBULATORY_CARE_PROVIDER_SITE_OTHER): Payer: Managed Care, Other (non HMO) | Admitting: Family Medicine

## 2017-04-03 ENCOUNTER — Telehealth (HOSPITAL_COMMUNITY): Payer: Self-pay | Admitting: Cardiology

## 2017-04-03 ENCOUNTER — Encounter: Payer: Self-pay | Admitting: Family Medicine

## 2017-04-03 VITALS — BP 131/90 | HR 95 | Temp 98.8°F | Resp 16 | Ht 73.23 in | Wt >= 6400 oz

## 2017-04-03 DIAGNOSIS — K920 Hematemesis: Secondary | ICD-10-CM

## 2017-04-03 DIAGNOSIS — R42 Dizziness and giddiness: Secondary | ICD-10-CM

## 2017-04-03 LAB — POCT CBC
Granulocyte percent: 68.2 %G (ref 37–80)
HCT, POC: 47.7 % (ref 43.5–53.7)
HEMOGLOBIN: 15.3 g/dL (ref 14.1–18.1)
LYMPH, POC: 1.9 (ref 0.6–3.4)
MCH, POC: 28.9 pg (ref 27–31.2)
MCHC: 32 g/dL (ref 31.8–35.4)
MCV: 90.3 fL (ref 80–97)
MID (cbc): 0.7 (ref 0–0.9)
MPV: 8.4 fL (ref 0–99.8)
PLATELET COUNT, POC: 226 10*3/uL (ref 142–424)
POC GRANULOCYTE: 5.7 (ref 2–6.9)
POC LYMPH %: 22.9 % (ref 10–50)
POC MID %: 8.9 %M (ref 0–12)
RBC: 5.29 M/uL (ref 4.69–6.13)
RDW, POC: 13.6 %
WBC: 8.3 10*3/uL (ref 4.6–10.2)

## 2017-04-03 LAB — COMPREHENSIVE METABOLIC PANEL
A/G RATIO: 1.4 (ref 1.2–2.2)
ALT: 20 IU/L (ref 0–44)
AST: 23 IU/L (ref 0–40)
Albumin: 4.2 g/dL (ref 3.5–5.5)
Alkaline Phosphatase: 55 IU/L (ref 39–117)
BUN/Creatinine Ratio: 13 (ref 9–20)
BUN: 17 mg/dL (ref 6–20)
Bilirubin Total: 1.4 mg/dL — ABNORMAL HIGH (ref 0.0–1.2)
CALCIUM: 9.5 mg/dL (ref 8.7–10.2)
CO2: 28 mmol/L (ref 20–29)
Chloride: 97 mmol/L (ref 96–106)
Creatinine, Ser: 1.3 mg/dL — ABNORMAL HIGH (ref 0.76–1.27)
GFR, EST AFRICAN AMERICAN: 81 mL/min/{1.73_m2} (ref >59)
GFR, EST NON AFRICAN AMERICAN: 70 mL/min/{1.73_m2} (ref >59)
GLOBULIN, TOTAL: 3 g/dL (ref 1.5–4.5)
Glucose: 89 mg/dL (ref 65–99)
POTASSIUM: 3.9 mmol/L (ref 3.5–5.2)
SODIUM: 143 mmol/L (ref 134–144)
Total Protein: 7.2 g/dL (ref 6.0–8.5)

## 2017-04-03 LAB — POCT URINALYSIS DIP (MANUAL ENTRY)
BILIRUBIN UA: NEGATIVE
BILIRUBIN UA: NEGATIVE mg/dL
GLUCOSE UA: NEGATIVE mg/dL
Leukocytes, UA: NEGATIVE
Nitrite, UA: NEGATIVE
PH UA: 6 (ref 5.0–8.0)
Protein Ur, POC: NEGATIVE mg/dL
RBC UA: NEGATIVE
SPEC GRAV UA: 1.01 (ref 1.010–1.025)
Urobilinogen, UA: 0.2 E.U./dL

## 2017-04-03 NOTE — Telephone Encounter (Signed)
TRIAGE VOICEMAIL LEFT @ 818  Patient requests appt today  Returned call and patient reports he DOES not need an appointment as he scheduled a OV with is PCP Patient reports he was experiencing some dizzy spells and coughed up some blood, states the last time this happened he was in A-fib.  Advised to be sure to keep appt with PCP for evaluation

## 2017-04-03 NOTE — Progress Notes (Signed)
Subjective:    Patient ID: Mark Baker, male    DOB: Jul 17, 1981, 36 y.o.   MRN: [id number] Chief Complaint  Patient presents with  . Dizziness    with headaches  . Emesis    with some  blood that started today     HPI  For the past 2d Dry - no sig peripheral edema. Gums are sensitive so often a little pink in his mucous when he spits out. This morning he felt nauseas after eating and when he threw up it was the food and dark red blood. No f/c but did feel hot this a.m. No change in urine or bowels - a smidge constipated yesterday but is resolved as doing low carb diet so has had increased meat. No resp sxs, no URI sxs, no cough. Did have some CP last week but other than that has not had any in a LONG time. They were lasting 5-6 min and he thinks it was due to stress and he took an extra imdur at night and no sxs since.   Has been out of his amiodarone and his coreg x 1 wk as his insurance is mandating that he get them from a mail order pharmacy but has had sig trouble w/ that prev and needing to get [facility] but ins won't pay for that so finally got these yest - a month free from the [facility] heart failure fund. Restarted yest.  Has 3 little girls - 3, 6, 12 - both he and wife work but he spends a lot of time being a very hands on dad.  Past Medical History:  Diagnosis Date  . Asthma    childhood asthma no exacerbation since age 56   . Chronic combined systolic and diastolic CHF (congestive heart failure) (HCC)    a) ECHO (11/2012): EF 40-45%, RV fx difficult to see, nl size b) ECHO (05/2014): EF 20-25%, grade 2 DD, RV mildly dilated and sys fx mod reduced  . Hypertension   . NICM (nonischemic cardiomyopathy) (HCC)    a) (11/2012): normal coronaries  . OSA (obstructive sleep apnea)    Past Surgical History:  Procedure Laterality Date  . ANTERIOR CRUCIATE LIGAMENT REPAIR     right  . CARDIOVERSION N/A 11/07/2016   Procedure: CARDIOVERSION;   Surgeon: Dolores Patty, MD;  Location: [facility] OR;  Service: Cardiovascular;  Laterality: N/A;  Will need extra help positioning patient  . LEFT HEART CATHETERIZATION WITH CORONARY ANGIOGRAM N/A 11/24/2012   Procedure: LEFT HEART CATHETERIZATION WITH CORONARY ANGIOGRAM;  Surgeon: Peter M Swaziland, MD;  Location: [facility] CATH LAB;  Service: Cardiovascular;  Laterality: N/A;  . RIGHT/LEFT HEART CATH AND CORONARY ANGIOGRAPHY N/A 03/18/2017   Procedure: Right/Left Heart Cath and Coronary Angiography;  Surgeon: Dolores Patty, MD;  Location: [facility] INVASIVE CV LAB;  Service: Cardiovascular;  Laterality: N/A;  . TEE WITHOUT CARDIOVERSION N/A 11/07/2016   Procedure: TRANSESOPHAGEAL ECHOCARDIOGRAM (TEE);  Surgeon: Dolores Patty, MD;  Location: [facility] OR;  Service: Cardiovascular;  Laterality: N/A;   Current Outpatient Prescriptions on File Prior to Visit  Medication Sig Dispense Refill  . amiodarone (PACERONE) 200 MG tablet Take 1 tablet (200 mg total) by mouth 2 (two) times daily. HF FUND 60 tablet 0  . atorvastatin (LIPITOR) 40 MG tablet Take 1 tablet (40 mg total) by mouth daily. 90 tablet 3  . carvedilol (COREG) 3.125 MG tablet Take 1 tablet (3.125 mg total) by mouth 2 (two) times daily.  HF FUND 60 tablet 0  . digoxin (LANOXIN) 0.25 MG tablet Take 1 tablet (0.25 mg total) by mouth daily. 90 tablet 3  . hydrALAZINE (APRESOLINE) 50 MG tablet Take 1 tablet (50 mg total) by mouth every 8 (eight) hours. 90 tablet 6  . isosorbide mononitrate (IMDUR) 60 MG 24 hr tablet Take 1 tablet (60 mg total) by mouth daily. 90 tablet 3  . losartan (COZAAR) 25 MG tablet Take 1 tablet (25 mg total) by mouth at bedtime. 90 tablet 3  . potassium chloride SA (K-DUR,KLOR-CON) 20 MEQ tablet Take 1 tablet (20 mEq total) by mouth daily. 90 tablet 3  . sildenafil (VIAGRA) 100 MG tablet Take 1 tablet (100 mg total) by mouth daily as needed for erectile dysfunction. 5 tablet 3  . spironolactone (ALDACTONE) 25 MG tablet Take 1 tablet (25 mg  total) by mouth daily. 90 tablet 3  . torsemide (DEMADEX) 20 MG tablet Take 40 mg (2 Tablets) in the AM and 20 mg (1 Tablet) in the PM (Patient taking differently: Take 20-40 mg by mouth See admin instructions. Take 40 mg (2 Tablets) in the AM and 20 mg (1 Tablet) in the PM) 270 tablet 6  . warfarin (COUMADIN) 7.5 MG tablet Take 1 tablet (7.5 mg total) by mouth daily at 6 PM. 45 tablet 6   No current facility-administered medications on file prior to visit.    Allergies  Allergen Reactions  . Aspirin Hives, Itching and Rash   Family History  Problem Relation Age of Onset  . Hypertension Father   . Heart failure Father        paternal side of family   . Hypertension Sister   . Stroke Maternal Grandmother   . Hypertension Unknown        maternal side of family   . Diabetes Neg Hx   . Hyperlipidemia Neg Hx    Social History   Social History  . Marital status: Married    Spouse name: N/A  . Number of children: N/A  . Years of education: N/A   Social History Main Topics  . Smoking status: Never Smoker  . Smokeless tobacco: Never Used  . Alcohol use 0.0 oz/week     Comment: seldom.  . Drug use: No  . 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    Other Topics Concern  . None   Social History Narrative   Lives in [place] with wife of 4 years.  Works for [employer] at [employer].  1 son and 2 daughters.     Denies cigarettes. Drank 1 beer last night 11/22/12. Denies drugs          Depression screen [facility] 2/9 04/03/2017 12/16/2016 01/18/2015 01/16/2015 12/09/2012  Decreased Interest 0 0 0 0 0  Down, Depressed, Hopeless 0 0 0 0 0  PHQ - 2 Score 0 0 0 0 0      Review of Systems  Constitutional: Positive for activity change and diaphoresis. Negative for appetite change, chills, fever and unexpected weight change.  HENT: Positive for mouth sores (gums bleed after brushing). Negative for congestion, postnasal drip, rhinorrhea, sinus pain, sinus pressure and  sore throat.   Respiratory: Negative for choking, chest tightness, shortness of breath and wheezing.   Cardiovascular: Positive for chest pain. Negative for palpitations and leg swelling.  Gastrointestinal: Positive for constipation, nausea and vomiting. Negative for abdominal pain, anal bleeding, blood in stool and diarrhea.  Genitourinary:  Negative for decreased urine volume, difficulty urinating, flank pain, frequency, hematuria and urgency.  Neurological: Positive for dizziness and light-headedness. Negative for tremors, syncope, facial asymmetry, speech difficulty, weakness and numbness.  Hematological: Bruises/bleeds easily (on blood thinner warfarin).  Psychiatric/Behavioral: Negative for sleep disturbance.       Objective:   Physical Exam  Constitutional: He is oriented to person, place, and time. He appears well-developed and well-nourished. No distress.  HENT:  Head: Normocephalic and atraumatic.  Right Ear: Tympanic membrane, external ear and ear canal normal.  Left Ear: Tympanic membrane, external ear and ear canal normal.  Nose: Nose normal.  Mouth/Throat: Oropharynx is clear and moist and mucous membranes are normal. No oropharyngeal exudate.  Eyes: Conjunctivae are normal. No scleral icterus.  Neck: Normal range of motion. Neck supple. No thyromegaly present.  Cardiovascular: Normal rate, regular rhythm, normal heart sounds and intact distal pulses.   Pulmonary/Chest: Effort normal and breath sounds normal. No respiratory distress.  Abdominal: Soft. Bowel sounds are normal. There is no tenderness (exam limited by body habitus).  Musculoskeletal: He exhibits no edema.  Lymphadenopathy:    He has no cervical adenopathy.  Neurological: He is alert and oriented to person, place, and time.  Skin: Skin is warm and dry. He is not diaphoretic. No erythema.  Psychiatric: He has a normal mood and affect. His behavior is normal.         BP 131/90   Pulse 95   Temp 98.8 F  (37.1 C) (Oral)   Resp 16   Ht 6' 1.23" (1.86 m)   Wt (!) 468 lb (212.3 kg)   SpO2 93%   BMI 61.36 kg/m   Orthostatic VS for the past 24 hrs (Last 3 readings): negative orthostatic vitals  BP- Lying Pulse- Lying BP- Sitting Pulse- Sitting BP- Standing at 0 minutes Pulse- Standing at 0 minutes  04/03/17 1044 128/82 82 132/80 80 136/82 86   [facility] reading (PRIMARY) by  Dr. Clelia Croft. EKG: Sinus  Rhythm  -Incomplete left bundle branch block.   -Left atrial enlargement.   Voltage criteria for LVH  (R(I)+S(III) exceeds 2.50 mV).   -Nonspecific ST depression  -Seen with left ventricular hypertrophy (strain) or digitalis effect.  No sig change when compared to prior EKG 02/17/17  Assessment & Plan:   1. Hematemesis with nausea - isolated episode of dark red blood in emesis this a.m. But always does have blood when he brushes his teeth due to warfarin. Cbc nml, nausea completely resolved, bowels nml, no GI sxs - I suspect that this was not blood - hopefully some digested odd food but very high risk for GI bleed on warfarin so will refer to GI for further eval andcover with high dose ppi in the interim while checking home HOC x 3 to confirm. If any recurrence -> go to ER. If + HOC -> needs to see GI immed.  2. Dizziness and giddiness - endorses orthostatic lightheadedness sxs though BP/pulse steady today.   Unknown etiology - work-up today normal.  He has been out of his amiodarone 200mg  bid and his carvedilol 3.125 bid for a week - just picked up and restarted yesterday.  Watchful waiting on sxs. I would expect them to improve daily as he is back on his med regimen. However, it may take a while for the amiodarone to get fully loaded in his system so poss sev wks to get completely back to normal. But if not improvement in sev d -> RTC or if  worsening at all -> to ER as this is a VERY high risk pt who is new to me.  Orders Placed This Encounter  Procedures  . Comprehensive metabolic panel  . Ambulatory  referral to Gastroenterology    Referral Priority:   Routine    Referral Type:   Consultation    Referral Reason:   Specialty Services Required    Number of Visits Requested:   1  . Orthostatic vital signs  . POCT urinalysis dipstick  . POCT CBC  . POC Hemoccult Bld/Stl (3-Cd Home Screen)    Standing Status:   Future    Standing Expiration Date:   04/03/2018  . EKG 12-Lead   Over 40 min spent in face-to-face evaluation of and consultation with patient and coordination of care.  Over 50% of this time was spent counseling this patient.  Norberto Sorenson, M.D.  Primary Care at [facility] [address] [place], [place] [address] [phone] phone [phone] fax  04/06/17 5:36 AM  Results for orders placed or performed in visit on 04/03/17  POCT urinalysis dipstick  Result Value Ref Range   Color, UA yellow yellow   Clarity, UA cloudy (A) clear   Glucose, UA negative negative mg/dL   Bilirubin, UA negative negative   Ketones, POC UA negative negative mg/dL   Spec Grav, UA 5.400 8.676 - 1.025   Blood, UA negative negative   pH, UA 6.0 5.0 - 8.0   Protein Ur, POC negative negative mg/dL   Urobilinogen, UA 0.2 0.2 or 1.0 E.U./dL   Nitrite, UA Negative Negative   Leukocytes, UA Negative Negative  POCT CBC  Result Value Ref Range   WBC 8.3 4.6 - 10.2 K/uL   Lymph, poc 1.9 0.6 - 3.4   POC LYMPH PERCENT 22.9 10 - 50 %L   MID (cbc) 0.7 0 - 0.9   POC MID % 8.9 0 - 12 %M   POC Granulocyte 5.7 2 - 6.9   Granulocyte percent 68.2 37 - 80 %G   RBC 5.29 4.69 - 6.13 M/uL   Hemoglobin 15.3 14.1 - 18.1 g/dL   HCT, POC 19.5 09.3 - 53.7 %   MCV 90.3 80 - 97 fL   MCH, POC 28.9 27 - 31.2 pg   MCHC 32.0 31.8 - 35.4 g/dL   RDW, POC 26.7 %   Platelet Count, POC 226 142 - 424 K/uL   MPV 8.4 0 - 99.8 fL

## 2017-04-03 NOTE — Patient Instructions (Addendum)
I suspect that your symptoms of the lightheadedness or dizziness when standing up have been do to being off of the amlodipine and the Coreg for the past week.  After you restart the amlodipine, it may take up to 3 weeks until it is fully loaded in your system. However I would expect your symptoms to get gradually better over this time.   Take the Nexium twice a day - 30 minutes before breakfast and 30 minutes before dinner. If you forget it, just take it as soon as possible.  Please drop off or mail in the home stool tests. If you do not have any further symptoms and these are negative, you do not need to see the GI doctors.     IF you received an x-ray today, you will receive an invoice from [facility]. Please contact [facility] at [phone] with questions or concerns regarding your invoice.   IF you received labwork today, you will receive an invoice from [facility]. Please contact [facility] at [phone] with questions or concerns regarding your invoice.   Our billing staff will not be able to assist you with questions regarding bills from these companies.  You will be contacted with the lab results as soon as they are available. The fastest way to get your results is to activate your My Chart account. Instructions are located on the last page of this paperwork. If you have not heard from [place] regarding the results in 2 weeks, please contact this office.      Orthostatic Hypotension Orthostatic hypotension is a sudden drop in blood pressure that happens when you quickly change positions, such as when you get up from a seated or lying position. Blood pressure is a measurement of how strongly, or weakly, your blood is pressing against the walls of your arteries. Arteries are blood vessels that carry blood from your heart throughout your body. When blood pressure is too low, you may not get enough blood to your brain or to the rest of your organs. This can cause weakness,  light-headedness, rapid heartbeat, and fainting. This can last for just a few seconds or for up to a few minutes. Orthostatic hypotension is usually not a serious problem. However, if it happens frequently or gets worse, it may be a sign of something more serious. What are the causes? This condition may be caused by:  Sudden changes in posture, such as standing up quickly after you have been sitting or lying down.  Blood loss.  Loss of body fluids (dehydration).  Heart problems.  Hormone (endocrine) problems.  Pregnancy.  Severe infection.  Lack of certain nutrients.  Severe allergic reactions (anaphylaxis).  Certain medicines, such as blood pressure medicine or medicines that make the body lose excess fluids (diuretics). Sometimes, this condition can be caused by not taking medicine as directed, such as taking too much of a certain medicine.  What increases the risk? Certain factors can make you more likely to develop orthostatic hypotension, including:  Age. Risk increases as you get older.  Conditions that affect the heart or the central nervous system.  Taking certain medicines, such as blood pressure medicine or diuretics.  Being pregnant.  What are the signs or symptoms? Symptoms of this condition may include:  Weakness.  Light-headedness.  Dizziness.  Blurred vision.  Fatigue.  Rapid heartbeat.  Fainting, in severe cases.  How is this diagnosed? This condition is diagnosed based on:  Your medical history.  Your symptoms.  Your blood pressure measurement. Your  health care provider will check your blood pressure when you are: ? Lying down. ? Sitting. ? Standing.  A blood pressure reading is recorded as two numbers, such as "120 over 80" (or 120/80). The first ("top") number is called the systolic pressure. It is a measure of the pressure in your arteries as your heart beats. The second ("bottom") number is called the diastolic pressure. It is a  measure of the pressure in your arteries when your heart relaxes between beats. Blood pressure is measured in a unit called mm Hg. Healthy blood pressure for adults is 120/80. If your blood pressure is below 90/60, you may be diagnosed with hypotension. Other information or tests that may be used to diagnose orthostatic hypotension include:  Your other vital signs, such as your heart rate and temperature.  Blood tests.  Tilt table test. For this test, you will be safely secured to a table that moves you from a lying position to an upright position. Your heart rhythm and blood pressure will be monitored during the test.  How is this treated? Treatment for this condition may include:  Changing your diet. This may involve eating more salt (sodium) or drinking more water.  Taking medicines to raise your blood pressure.  Changing the dosage of certain medicines you are taking that might be lowering your blood pressure.  Wearing compression stockings. These stockings help to prevent blood clots and reduce swelling in your legs.  In some cases, you may need to go to the hospital for:  Fluid replacement. This means you will receive fluids through an IV tube.  Blood replacement. This means you will receive donated blood through an IV tube (transfusion).  Treating an infection or heart problems, if this applies.  Monitoring. You may need to be monitored while medicines that you are taking wear off.  Follow these instructions at home: Eating and drinking   Drink enough fluid to keep your urine clear or pale yellow.  Eat a healthy diet and follow instructions from your health care provider about eating or drinking restrictions. A healthy diet includes: ? Fresh fruits and vegetables. ? Whole grains. ? Lean meats. ? Low-fat dairy products.  Eat extra salt only as directed. Do not add extra salt to your diet unless your health care provider told you to do that.  Eat frequent, small  meals.  Avoid standing up suddenly after eating. Medicines  Take over-the-counter and prescription medicines only as told by your health care provider. ? Follow instructions from your health care provider about changing the dosage of your current medicines, if this applies. ? Do not stop or adjust any of your medicines on your own. General instructions  Wear compression stockings as told by your health care provider.  Get up slowly from lying down or sitting positions. This gives your blood pressure a chance to adjust.  Avoid hot showers and excessive heat as directed by your health care provider.  Return to your normal activities as told by your health care provider. Ask your health care provider what activities are safe for you.  Do not use any products that contain nicotine or tobacco, such as cigarettes and e-cigarettes. If you need help quitting, ask your health care provider.  Keep all follow-up visits as told by your health care provider. This is important. Contact a health care provider if:  You vomit.  You have diarrhea.  You have a fever for more than 2-3 days.  You feel more thirsty than usual.  You feel weak and tired. Get help right away if:  You have chest pain.  You have a fast or irregular heartbeat.  You develop numbness in any part of your body.  You cannot move your arms or your legs.  You have trouble speaking.  You become sweaty or feel lightheaded.  You faint.  You feel short of breath.  You have trouble staying awake.  You feel confused. This information is not intended to replace advice given to you by your health care provider. Make sure you discuss any questions you have with your health care provider. Document Released: 07/19/2002 Document Revised: 04/16/2016 Document Reviewed: 01/19/2016 Elsevier Interactive Patient Education  2018 [employer].

## 2017-04-18 ENCOUNTER — Telehealth (HOSPITAL_COMMUNITY): Payer: Self-pay

## 2017-04-18 NOTE — Telephone Encounter (Signed)
I spoke to Mark Baker on the phone and he advised he was back at work and "never felt better." It has been several weeks since I have been able to reach him but he agrees that he no longer needs paramedicine. I explained that if anything changed and he needed paramedicine back, to let to clinic staff know. I will let Bard Herbert know to remove him from our patient list.

## 2017-05-02 ENCOUNTER — Encounter (HOSPITAL_COMMUNITY): Payer: Self-pay | Admitting: *Deleted

## 2017-05-02 ENCOUNTER — Telehealth (HOSPITAL_COMMUNITY): Payer: Self-pay | Admitting: *Deleted

## 2017-05-02 NOTE — Telephone Encounter (Signed)
Received forms from [employer] regarding patient's return to work.  Forms completed/signed and faxed today to [phone].  Original forms will be scanned to patient's electronic medical record.

## 2017-05-02 NOTE — Telephone Encounter (Signed)
Opened in error

## 2017-06-13 ENCOUNTER — Encounter (HOSPITAL_COMMUNITY): Payer: Self-pay | Admitting: Emergency Medicine

## 2017-06-13 ENCOUNTER — Other Ambulatory Visit (HOSPITAL_COMMUNITY): Payer: Self-pay | Admitting: *Deleted

## 2017-06-13 ENCOUNTER — Other Ambulatory Visit: Payer: Self-pay

## 2017-06-13 ENCOUNTER — Encounter: Payer: Self-pay | Admitting: Family Medicine

## 2017-06-13 ENCOUNTER — Emergency Department (HOSPITAL_COMMUNITY): Payer: Managed Care, Other (non HMO)

## 2017-06-13 ENCOUNTER — Emergency Department (HOSPITAL_COMMUNITY)
Admission: EM | Admit: 2017-06-13 | Discharge: 2017-06-13 | Disposition: A | Discharge status: Home / Self Care | Payer: Managed Care, Other (non HMO) | Attending: Emergency Medicine | Admitting: Emergency Medicine

## 2017-06-13 DIAGNOSIS — R079 Chest pain, unspecified: Secondary | ICD-10-CM | POA: Diagnosis present

## 2017-06-13 DIAGNOSIS — N183 Chronic kidney disease, stage 3 (moderate): Secondary | ICD-10-CM | POA: Insufficient documentation

## 2017-06-13 DIAGNOSIS — Z7901 Long term (current) use of anticoagulants: Secondary | ICD-10-CM | POA: Diagnosis not present

## 2017-06-13 DIAGNOSIS — J45909 Unspecified asthma, uncomplicated: Secondary | ICD-10-CM | POA: Insufficient documentation

## 2017-06-13 DIAGNOSIS — I130 Hypertensive heart and chronic kidney disease with heart failure and stage 1 through stage 4 chronic kidney disease, or unspecified chronic kidney disease: Secondary | ICD-10-CM | POA: Diagnosis not present

## 2017-06-13 DIAGNOSIS — R0789 Other chest pain: Secondary | ICD-10-CM | POA: Diagnosis not present

## 2017-06-13 DIAGNOSIS — I509 Heart failure, unspecified: Secondary | ICD-10-CM | POA: Diagnosis not present

## 2017-06-13 DIAGNOSIS — K920 Hematemesis: Secondary | ICD-10-CM | POA: Insufficient documentation

## 2017-06-13 DIAGNOSIS — Z79899 Other long term (current) drug therapy: Secondary | ICD-10-CM | POA: Insufficient documentation

## 2017-06-13 LAB — I-STAT TROPONIN, ED: TROPONIN I, POC: 0.09 ng/mL — AB (ref 0.00–0.08)

## 2017-06-13 LAB — BASIC METABOLIC PANEL
ANION GAP: 11 (ref 5–15)
BUN: 11 mg/dL (ref 6–20)
CALCIUM: 9.1 mg/dL (ref 8.9–10.3)
CO2: 27 mmol/L (ref 22–32)
CREATININE: 1.41 mg/dL — AB (ref 0.61–1.24)
Chloride: 102 mmol/L (ref 101–111)
GFR calc non Af Amer: >60 mL/min (ref >60)
Glucose, Bld: 109 mg/dL — ABNORMAL HIGH (ref 65–99)
Potassium: 3.5 mmol/L (ref 3.5–5.1)
Sodium: 140 mmol/L (ref 135–145)

## 2017-06-13 LAB — HEPATIC FUNCTION PANEL
ALT: 24 U/L (ref 17–63)
AST: 32 U/L (ref 15–41)
Albumin: 4.1 g/dL (ref 3.5–5.0)
Alkaline Phosphatase: 49 U/L (ref 38–126)
Bilirubin, Direct: 0.4 mg/dL (ref 0.1–0.5)
Indirect Bilirubin: 1.9 mg/dL — ABNORMAL HIGH (ref 0.3–0.9)
Total Bilirubin: 2.3 mg/dL — ABNORMAL HIGH (ref 0.3–1.2)
Total Protein: 7.5 g/dL (ref 6.5–8.1)

## 2017-06-13 LAB — CBC
HCT: 46 % (ref 39.0–52.0)
HEMOGLOBIN: 14.9 g/dL (ref 13.0–17.0)
MCH: 30 pg (ref 26.0–34.0)
MCHC: 32.4 g/dL (ref 30.0–36.0)
MCV: 92.6 fL (ref 78.0–100.0)
PLATELETS: 191 10*3/uL (ref 150–400)
RBC: 4.97 MIL/uL (ref 4.22–5.81)
RDW: 12.9 % (ref 11.5–15.5)
WBC: 7.3 10*3/uL (ref 4.0–10.5)

## 2017-06-13 LAB — BRAIN NATRIURETIC PEPTIDE: B Natriuretic Peptide: 84.3 pg/mL (ref 0.0–100.0)

## 2017-06-13 MED ORDER — FUROSEMIDE 10 MG/ML IJ SOLN
80.0000 mg | Freq: Once | INTRAMUSCULAR | Status: AC
  Administered 2017-06-13: 80 mg via INTRAVENOUS
  Filled 2017-06-13: qty 8

## 2017-06-13 MED ORDER — CARVEDILOL 3.125 MG PO TABS: 3.1250 mg | ORAL_TABLET | Freq: Two times a day (BID) | ORAL | 3 refills | Status: DC

## 2017-06-13 MED ORDER — AMIODARONE HCL 200 MG PO TABS
200.0000 mg | ORAL_TABLET | Freq: Two times a day (BID) | ORAL | 3 refills | Status: DC
Start: 2017-06-13 — End: 2017-08-13

## 2017-06-13 NOTE — ED Notes (Signed)
Pt given water per Trinna Post, [place]. When attempting to change pt in bariatric gown pt states "I hope I'm not getting admitted. If so, I won't be staying. I know the risks and I accept them but I have kids at home and I don't trust anyone to watch them so it's not an option." Trinna Post, [place] and Dr. Particia Nearing aware. Waiting to hear from cardiology

## 2017-06-13 NOTE — Discharge Instructions (Signed)
Please follow-up with a gastroenterologist as soon as possible for further evaluation and treatment of your bloody vomit.  Please also follow-up with Dr. Gala Romney as soon as possible for recheck.  Please return to [facility] department if you develop any new or worsening symptoms including intractable vomiting, lightheadedness or passing out, severe chest pain or shortness of breath, or any other new or concerning symptoms.

## 2017-06-13 NOTE — ED Triage Notes (Signed)
Pt to ER for evaluation of new onset lower central chest pressure last night while sitting down watching football. States nausea with one episode of vomiting today. VSS. Pt denies sob.

## 2017-06-13 NOTE — ED Notes (Signed)
ED Provider at bedside. 

## 2017-06-13 NOTE — ED Notes (Signed)
Pt given 2 urinals

## 2017-06-13 NOTE — ED Notes (Signed)
Pt verbalized understanding discharge instructions and denies any further needs or questions at this time. VS stable, ambulatory and steady gait.   

## 2017-06-13 NOTE — ED Notes (Signed)
Informed first nurse Toniann Fail of Troponin 0.09

## 2017-06-13 NOTE — ED Provider Notes (Signed)
[facility] EMERGENCY [facility]MENT Provider Note   CSN: [id number] Arrival date & time: 06/13/17  1723     History   Chief Complaint Chief Complaint  Patient presents with  . Chest Pain  . Abdominal Pain    HPI Mark Baker is a 36 y.o. male with history of hypertension, chronic combined systolic and diastolic CHF, nonischemic cardiomyopathy with chronic chest pain who presents with left-sided chest pain and hematemesis.  Patient reports he has chronic chest pain, however it became a little worse today. His chest pain is not worse on exertion.  It is left-sided and described as pressure.  It does not radiate. He had hematemesis soon after the chest pain got worse.  He has been having hematemesis for the past month and his doctor has been treating him with Nexium.  He has not seen a gastroenterologist.  His hematemesis was a little different today, as it seemed to have "chunks in it and looks like spaghetti sauce."  Patient denied any abdominal pain.  He also denies any diarrhea, bloody stools, shortness of breath, fever.    HPI  Past Medical History:  Diagnosis Date  . Asthma    childhood asthma no exacerbation since age 36   . Chronic combined systolic and diastolic CHF (congestive heart failure) (HCC)    a) ECHO (11/2012): EF 40-45%, RV fx difficult to see, nl size b) ECHO (05/2014): EF 20-25%, grade 2 DD, RV mildly dilated and sys fx mod reduced  . Hypertension   . NICM (nonischemic cardiomyopathy) (HCC)    a) (11/2012): normal coronaries  . OSA (obstructive sleep apnea)     Patient Active Problem List   Diagnosis Date Noted  . Syncope 01/01/2017  . Erectile dysfunction 12/25/2016  . Encounter for therapeutic drug monitoring 11/19/2016  . Paroxysmal atrial fibrillation (HCC)   . Chronic HFrEF (heart failure with reduced ejection fraction) (HCC) 07/05/2016  . Morbid obesity with body mass index (BMI) of 60.0 to 69.9 in adult [facility](HCC) 07/05/2016  . CKD (chronic  kidney disease), stage III (HCC) 07/05/2016  . Hyperlipidemia 11/25/2012  . Prolonged QT interval 11/24/2012  . Healthcare maintenance 09/03/2012  . HTN (hypertension) 02/19/2012  . Sleep apnea 02/10/2012  . Morbid obesity (HCC) 02/10/2012    Past Surgical History:  Procedure Laterality Date  . ANTERIOR CRUCIATE LIGAMENT REPAIR     right  . CARDIOVERSION N/A 11/07/2016   Procedure: CARDIOVERSION;  Surgeon: Dolores Pattyaniel R Bensimhon, MD;  [facility];  Service: Cardiovascular;  Laterality: N/A;  Will need extra help positioning patient  . LEFT HEART CATHETERIZATION WITH CORONARY ANGIOGRAM N/A 11/24/2012   Procedure: LEFT HEART CATHETERIZATION WITH CORONARY ANGIOGRAM;  Surgeon: Peter M SwazilandJordan, MD;  [facility];  Service: Cardiovascular;  Laterality: N/A;  . RIGHT/LEFT HEART CATH AND CORONARY ANGIOGRAPHY N/A 03/18/2017   Procedure: Right/Left Heart Cath and Coronary Angiography;  Surgeon: Dolores PattyBensimhon, Daniel R, MD;  Location: [facility] INVASIVE CV LAB;  Service: Cardiovascular;  Laterality: N/A;  . TEE WITHOUT CARDIOVERSION N/A 11/07/2016   Procedure: TRANSESOPHAGEAL ECHOCARDIOGRAM (TEE);  Surgeon: Dolores Pattyaniel R Bensimhon, MD;  [facility];  Service: Cardiovascular;  Laterality: N/A;       Home Medications    Prior to Admission medications   Medication Sig Start Date End Date Taking? Authorizing Provider  amiodarone (PACERONE) 200 MG tablet Take 1 tablet (200 mg total) by mouth 2 (two) times daily. 06/13/17  Yes Bensimhon, Bevelyn Bucklesaniel R, MD  atorvastatin (LIPITOR) 40 MG tablet Take  1 tablet (40 mg total) by mouth daily. 02/11/17  Yes Bensimhon, Bevelyn Buckles, MD  carvedilol (COREG) 3.125 MG tablet Take 1 tablet (3.125 mg total) by mouth 2 (two) times daily. 06/13/17  Yes Bensimhon, Bevelyn Buckles, MD  digoxin (LANOXIN) 0.25 MG tablet Take 1 tablet (0.25 mg total) by mouth daily. 02/11/17  Yes Bensimhon, Bevelyn Buckles, MD  hydrALAZINE (APRESOLINE) 50 MG tablet Take 1 tablet (50 mg total) by mouth every 8 (eight) hours.  11/14/16  Yes Clegg, Amy D, NP  isosorbide mononitrate (IMDUR) 60 MG 24 hr tablet Take 1 tablet (60 mg total) by mouth daily. 02/11/17  Yes Bensimhon, Bevelyn Buckles, MD  losartan (COZAAR) 25 MG tablet Take 1 tablet (25 mg total) by mouth at bedtime. 02/17/17  Yes Bensimhon, Bevelyn Buckles, MD  potassium chloride SA (K-DUR,KLOR-CON) 20 MEQ tablet Take 1 tablet (20 mEq total) by mouth daily. 02/11/17  Yes Bensimhon, Bevelyn Buckles, MD  sildenafil (VIAGRA) 100 MG tablet Take 1 tablet (100 mg total) by mouth daily as needed for erectile dysfunction. 02/11/17  Yes Bensimhon, Bevelyn Buckles, MD  spironolactone (ALDACTONE) 25 MG tablet Take 1 tablet (25 mg total) by mouth daily. 02/11/17  Yes Bensimhon, Bevelyn Buckles, MD  torsemide (DEMADEX) 20 MG tablet Take 40 mg (2 Tablets) in the AM and 20 mg (1 Tablet) in the PM Patient taking differently: Take 20-40 mg by mouth See admin instructions. Take 40 mg (2 Tablets) in the AM and 20 mg (1 Tablet) in the PM 02/11/17  Yes Bensimhon, Bevelyn Buckles, MD  warfarin (COUMADIN) 7.5 MG tablet Take 1 tablet (7.5 mg total) by mouth daily at 6 PM. 02/11/17  Yes Bensimhon, Bevelyn Buckles, MD    Family History Family History  Problem Relation Age of Onset  . Hypertension Father   . Heart failure Father        paternal side of family   . Hypertension Sister   . Stroke Maternal Grandmother   . Hypertension Unknown        maternal side of family   . Diabetes Neg Hx   . Hyperlipidemia Neg Hx     Social History Social History  Substance Use Topics  . Smoking status: Never Smoker  . Smokeless tobacco: Never Used  . Alcohol use 0.0 oz/week     Comment: seldom.     Allergies   Aspirin   Review of Systems Review of Systems  Constitutional: Negative for chills and fever.  HENT: Negative for facial swelling and sore throat.   Respiratory: Negative for shortness of breath.   Cardiovascular: Positive for chest pain.  Gastrointestinal: Positive for nausea and vomiting (Hematemesis). Negative for abdominal pain.    Genitourinary: Negative for dysuria.  Musculoskeletal: Negative for back pain.  Skin: Negative for rash and wound.  Neurological: Negative for headaches.  Psychiatric/Behavioral: The patient is not nervous/anxious.      Physical Exam Updated Vital Signs BP (!) 155/84   Pulse 71   Temp 98.8 F (37.1 C) (Oral)   Resp 16   SpO2 98%   Physical Exam  Constitutional: He appears well-developed and well-nourished. No distress.  Morbidly obese  HENT:  Head: Normocephalic and atraumatic.  Mouth/Throat: Oropharynx is clear and moist. No oropharyngeal exudate.  Eyes: Pupils are equal, round, and reactive to light. Conjunctivae are normal. Right eye exhibits no discharge. Left eye exhibits no discharge. No scleral icterus.  Neck: Normal range of motion. Neck supple. No thyromegaly present.  Cardiovascular: Normal rate, regular rhythm, normal  heart sounds and intact distal pulses.  Exam reveals no gallop and no friction rub.   No murmur heard. Pulmonary/Chest: Effort normal and breath sounds normal. No stridor. No respiratory distress. He has no wheezes. He has no rales. He exhibits tenderness.    Abdominal: Soft. Bowel sounds are normal. He exhibits no distension. There is no tenderness. There is no rebound and no guarding.  Musculoskeletal: He exhibits no edema.  Lymphadenopathy:    He has no cervical adenopathy.  Neurological: He is alert. Coordination normal.  Skin: Skin is warm and dry. No rash noted. He is not diaphoretic. No pallor.  Psychiatric: He has a normal mood and affect.  Nursing note and vitals reviewed.    ED Treatments / Results  Labs (all labs ordered are listed, but only abnormal results are displayed) Labs Reviewed  BASIC METABOLIC PANEL - Abnormal; Notable for the following:       Result Value   Glucose, Bld 109 (*)    Creatinine, Ser 1.41 (*)    All other components within normal limits  HEPATIC FUNCTION PANEL - Abnormal; Notable for the following:     Total Bilirubin 2.3 (*)    Indirect Bilirubin 1.9 (*)    All other components within normal limits  I-STAT TROPONIN, ED - Abnormal; Notable for the following:    Troponin i, poc 0.09 (*)    All other components within normal limits  CBC  BRAIN NATRIURETIC PEPTIDE    EKG  EKG Interpretation  Date/Time:  Friday June 13 2017 20:39:02 EDT Ventricular Rate:  85 PR Interval:    QRS Duration: 121 QT Interval:  404 QTC Calculation: 481 R Axis:   -16 Text Interpretation:  Age not entered, assumed to be  36 years old for purpose of ECG interpretation Sinus rhythm Probable left atrial enlargement LVH with IVCD and secondary repol abnrm Borderline prolonged QT interval No significant change since last tracing Confirmed by Jacalyn Lefevre [phone]) on 06/13/2017 9:01:39 PM       Radiology Dg Chest 2 View  Result Date: 06/13/2017 CLINICAL DATA:  36 year old male with chest pain. EXAM: CHEST  2 VIEW COMPARISON:  Chest radiograph dated 01/01/2017 FINDINGS: There is mild cardiomegaly with vascular congestion. No interstitial edema. There is no focal consolidation, pleural effusion, or pneumothorax. No acute osseous pathology. IMPRESSION: Cardiomegaly with vascular congestion.  No focal consolidation. Electronically Signed   By: Elgie Collard M.D.   On: 06/13/2017 18:49    Procedures Procedures (including critical care time)  Medications Ordered in ED Medications  furosemide (LASIX) injection 80 mg (80 mg Intravenous Given 06/13/17 2205)     Initial Impression / Assessment and Plan / ED Course  I have reviewed the triage vital signs and the nursing notes.  Pertinent labs & imaging results that were available during my care of the patient were reviewed by me and considered in my medical decision making (see chart for details).     Patient with left-sided chest pain little worse than baseline and episode of hematemesis.  Elevated troponin at 0.09.  I spoke with patient's cardiologist,  Dr. Gala Romney, who advised 80 mg dose of Lasix and follow-up with him in the office.  He feels that because patient had a clear cardiac catheterization recently and he has a nonischemic cardiomyopathy, that his troponin may have been from a mild fluid overload.  After 80 mg Lasix in the ED, patient has no chest pain.  Will discharge patient home with close follow-up and referral  to GI for evaluation of ongoing hematemesis.  Chest x-ray shows cardiomegaly with vascular congestion, no focal consolidation.  Labs are unremarkable, except for a total bilirubin 2.3 and creatinine 1.41. BNP WNL. His H&H are within normal limits.  Return precautions discussed.  Patient understands and agrees with plan.  Patient vitals stable throughout ED course and discharged in satisfactory condition. I discussed patient case with Dr. Particia Nearing who guided the patient's management and agrees with plan.   Final Clinical Impressions(s) / ED Diagnoses   Final diagnoses:  Chest pain, unspecified type    New Prescriptions New Prescriptions   No medications on file     Verdis Prime 06/13/17 2314    Jacalyn Lefevre, MD 06/13/17 [phone]

## 2017-06-19 ENCOUNTER — Encounter: Payer: Managed Care, Other (non HMO) | Admitting: Family Medicine

## 2017-06-30 ENCOUNTER — Encounter: Payer: Self-pay | Admitting: Family Medicine

## 2017-06-30 ENCOUNTER — Telehealth (HOSPITAL_COMMUNITY): Payer: Self-pay

## 2017-06-30 ENCOUNTER — Encounter (HOSPITAL_COMMUNITY): Payer: Self-pay

## 2017-06-30 NOTE — Progress Notes (Deleted)
   Subjective:    Patient ID: Mark Baker, male    DOB: 1981-03-16, 35 y.o.   MRN: [id number]  HPI  Primary Preventative Screenings: Prostate Cancer:NA due to age STI screening: neg HIV 06/2016 Colorectal Cancer: NA due to age Tobacco use/AAA/Lung/EtOH/Illicit substances:  Cardiac: Follows very closely with heart failure clinic and Dr. Gala Romney Weight/Blood sugar/Diet/Exercise: BMI Readings from Last 3 Encounters:  04/03/17 61.36 kg/m  03/18/17 59.70 kg/m  03/07/17 60.04 kg/m   Lab Results  Component Value Date   HGBA1C 5.6 07/06/2016   OTC/Vit/Supp/Herbal: Dentist/Optho: Immunizations:  Immunization History  Administered Date(s) Administered  . Influenza,inj,Quad PF,6+ Mos 05/23/2014  . Pneumococcal Polysaccharide-23 12/16/2016    Chronic Medical Conditions: 1. A/C Diastolic/Systolic Heart Failure with a non-ischemic cardiomyopathy. During prior CHF exacerbations, he has been minimally responsive to IV Lasix so had to have a PICC line placed but was stable on torsemide at discharge. Discharge weight 477 pounds. EF~20-25% on echo 10/2016. NYHA class I. On torsemide 40 mg qam and 20mg  qlunch (with K 10 qd),hydralazine 50 mg tid, imdur 60 mg daily, spironolactone 25mg  qd, digoxin 0.25mg  qd, carvedilol 3.125 bid, atorvastatin 40, losartan 25, and amiodarone 200mg  bid. Followed in the HF clinic and tends to quickly re-accumulate fluid despite attempts to adhere to a low sodium diet and fluid restrict to 2-2.5L qd. Got new bariatric scale into home to monitor weights. Is being offered for pt to participate in Cardiac Rehab program but is already going to the gym [employer] at [facility] on his own and feels he is doing well w/ that - walk, basketball, elliptical, bike - going to start swimming.  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  2. PAFib - new onset 10/2016 which spontaneously converted in-hospital on amiodarone 200 mg twice a day. On coumadin followed in  [facility] and has been at goal with INR 2.5 4/26 and next appt is 5/3. 3. A/C CKD Stage III - Creatinine peaked at 2.07 but was down to 1.7 at hosp d/c. Baseline creatinine 1.3-1.5  4. OSA: on cpap started after hosp d/c 11/14/16 - he is getting sig bills. Is trying to use every night.  Occ comes off  5. Morbid Obesity - BMI 60. Started going to the [employer] 3x/wk for exercise. 6. HLD: atorvastatin 40 - last lipid panel 12/16/16 sig improved with LDL 88, non-HDL 112 - close to goal of <70.  7. HTN: cough w/ losartan? On carvedilol 3.125 bid 8. Asthma - grew out of it. 9. ED: Viagra - has been instructed not to use his hydralazine or imdur on days he plans to use the viagra.  Mildly elevated bilirubin - indirect - minimally elev - highest 3.4 on 10/31/16 - has not been normal since once in 10/2012.  Review of Systems     Objective:   Physical Exam    06/13/17 - nml cbc, cmp (other than minimally elev bili), bnp   UA nml 04/03/17 Lipids 12/16/16 LDL 88, non-HDL 112 - close to goal of <70  TSH nml 11/01/2016 No prior vitamin D or B12 levels Assessment & Plan:  Flu shot Ua, A1c, lipids if fasting

## 2017-06-30 NOTE — Telephone Encounter (Signed)
[facility] appointment reminder call placed to patient for upcoming post-hospital follow up.  LVMTCB to confirm apt.  Patient also reminded to take all medications as prescribed on the day of his/her appointment and to bring all medications to this appointment.  Advised to call our office for tardiness or cancellations/rescheduling needs.  .Bradley, Megan Genevea  

## 2017-07-17 ENCOUNTER — Other Ambulatory Visit: Payer: Self-pay

## 2017-07-17 ENCOUNTER — Ambulatory Visit (HOSPITAL_COMMUNITY)
Admission: EM | Admit: 2017-07-17 | Discharge: 2017-07-17 | Disposition: A | Discharge status: Home / Self Care | Payer: Managed Care, Other (non HMO) | Attending: Emergency Medicine | Admitting: Emergency Medicine

## 2017-07-17 ENCOUNTER — Encounter (HOSPITAL_COMMUNITY): Payer: Self-pay | Admitting: Emergency Medicine

## 2017-07-17 DIAGNOSIS — M25562 Pain in left knee: Secondary | ICD-10-CM

## 2017-07-17 DIAGNOSIS — M5432 Sciatica, left side: Secondary | ICD-10-CM

## 2017-07-17 DIAGNOSIS — M25552 Pain in left hip: Secondary | ICD-10-CM | POA: Diagnosis not present

## 2017-07-17 MED ORDER — PREDNISONE 10 MG (21) PO TBPK: ORAL_TABLET | ORAL | 0 refills | Status: DC

## 2017-07-17 NOTE — ED Triage Notes (Signed)
Left knee and hip pain.  Pain can impact one or the other joint, be both and/or intermittent.  No known injury recently.  Does report knee surgery in college.

## 2017-07-17 NOTE — Discharge Instructions (Signed)
The description of pain most likely represents a sciatica type pain. Take the prednisone as directed. When taking, take with food. Follow-up with your primary care provider, make an appointment today.

## 2017-07-17 NOTE — ED Provider Notes (Signed)
[facility]    CSN: [id number] Arrival date & time: 07/17/17  1206     History   Chief Complaint Chief Complaint  Patient presents with  . Knee Pain    HPI Roseanne Renoddie Biby is a 36 y.o. male.   Pleasant 36 year old morbidly obese male complaining of pain to the left hip radiating to the left knee for 2 weeks. Pain is intermittent. It occurs without warning and at any  time. The pain may start out in the hip or start him in the hip and radiate up or down. It usually on the lateral aspect of the knee and the pain is in the posterior aspect of the lower hip. He has had no injury. There is no pain with ambulation or movement. It is not better or worse with position or movement. It is not exacerbated or elicited with weightbearing or ambulation. It often occurs just while sitting. It tends to be a shooting type pain and it may occur for several hours. It is partially relieved with acetaminophen.      Past Medical History:  Diagnosis Date  . Asthma    childhood asthma no exacerbation since age 36   . Chronic combined systolic and diastolic CHF (congestive heart failure) (HCC)    a) ECHO (11/2012): EF 40-45%, RV fx difficult to see, nl size b) ECHO (05/2014): EF 20-25%, grade 2 DD, RV mildly dilated and sys fx mod reduced  . Hypertension   . NICM (nonischemic cardiomyopathy) (HCC)    a) (11/2012): normal coronaries  . OSA (obstructive sleep apnea)     Patient Active Problem List   Diagnosis Date Noted  . Syncope 01/01/2017  . Erectile dysfunction 12/25/2016  . Encounter for therapeutic drug monitoring 11/19/2016  . Paroxysmal atrial fibrillation (HCC)   . Chronic HFrEF (heart failure with reduced ejection fraction) (HCC) 07/05/2016  . Morbid obesity with body mass index (BMI) of 60.0 to 69.9 in adult [facility](HCC) 07/05/2016  . CKD (chronic kidney disease), stage III (HCC) 07/05/2016  . Hyperlipidemia 11/25/2012  . Prolonged QT interval 11/24/2012  . Healthcare maintenance  09/03/2012  . HTN (hypertension) 02/19/2012  . Sleep apnea 02/10/2012  . Morbid obesity (HCC) 02/10/2012    Past Surgical History:  Procedure Laterality Date  . ANTERIOR CRUCIATE LIGAMENT REPAIR     right  . CARDIOVERSION N/A 11/07/2016   Procedure: CARDIOVERSION;  Surgeon: Dolores Pattyaniel R Bensimhon, MD;  [facility];  Service: Cardiovascular;  Laterality: N/A;  Will need extra help positioning patient  . LEFT HEART CATHETERIZATION WITH CORONARY ANGIOGRAM N/A 11/24/2012   Procedure: LEFT HEART CATHETERIZATION WITH CORONARY ANGIOGRAM;  Surgeon: Peter M SwazilandJordan, MD;  [facility];  Service: Cardiovascular;  Laterality: N/A;  . RIGHT/LEFT HEART CATH AND CORONARY ANGIOGRAPHY N/A 03/18/2017   Procedure: Right/Left Heart Cath and Coronary Angiography;  Surgeon: Dolores PattyBensimhon, Daniel R, MD;  [facility];  Service: Cardiovascular;  Laterality: N/A;  . TEE WITHOUT CARDIOVERSION N/A 11/07/2016   Procedure: TRANSESOPHAGEAL ECHOCARDIOGRAM (TEE);  Surgeon: Dolores Pattyaniel R Bensimhon, MD;  [facility];  Service: Cardiovascular;  Laterality: N/A;       Home Medications    Prior to Admission medications   Medication Sig Start Date End Date Taking? Authorizing Provider  amiodarone (PACERONE) 200 MG tablet Take 1 tablet (200 mg total) by mouth 2 (two) times daily. 06/13/17   Bensimhon, Bevelyn Bucklesaniel R, MD  atorvastatin (LIPITOR) 40 MG tablet Take 1 tablet (40 mg total) by mouth daily. 02/11/17  Bensimhon, Bevelyn Buckles, MD  carvedilol (COREG) 3.125 MG tablet Take 1 tablet (3.125 mg total) by mouth 2 (two) times daily. 06/13/17   Bensimhon, Bevelyn Buckles, MD  digoxin (LANOXIN) 0.25 MG tablet Take 1 tablet (0.25 mg total) by mouth daily. 02/11/17   Bensimhon, Bevelyn Buckles, MD  hydrALAZINE (APRESOLINE) 50 MG tablet Take 1 tablet (50 mg total) by mouth every 8 (eight) hours. 11/14/16   Clegg, Amy D, NP  isosorbide mononitrate (IMDUR) 60 MG 24 hr tablet Take 1 tablet (60 mg total) by mouth daily. 02/11/17   Bensimhon, Bevelyn Buckles,  MD  losartan (COZAAR) 25 MG tablet Take 1 tablet (25 mg total) by mouth at bedtime. 02/17/17   Bensimhon, Bevelyn Buckles, MD  predniSONE (STERAPRED UNI-PAK 21 TAB) 10 MG (21) TBPK tablet Dispense one 6 day pack. Take as directed with food. 07/17/17   Hayden Rasmussen, NP  sildenafil (VIAGRA) 100 MG tablet Take 1 tablet (100 mg total) by mouth daily as needed for erectile dysfunction. 02/11/17   Bensimhon, Bevelyn Buckles, MD  spironolactone (ALDACTONE) 25 MG tablet Take 1 tablet (25 mg total) by mouth daily. 02/11/17   Bensimhon, Bevelyn Buckles, MD  torsemide (DEMADEX) 20 MG tablet Take 40 mg (2 Tablets) in the AM and 20 mg (1 Tablet) in the PM Patient taking differently: Take 20-40 mg by mouth See admin instructions. Take 40 mg (2 Tablets) in the AM and 20 mg (1 Tablet) in the PM 02/11/17   Bensimhon, Bevelyn Buckles, MD  warfarin (COUMADIN) 7.5 MG tablet Take 1 tablet (7.5 mg total) by mouth daily at 6 PM. 02/11/17   Bensimhon, Bevelyn Buckles, MD    Family History Family History  Problem Relation Age of Onset  . Hypertension Father   . Heart failure Father        paternal side of family   . Hypertension Sister   . Stroke Maternal Grandmother   . Hypertension Unknown        maternal side of family   . Diabetes Neg Hx   . Hyperlipidemia Neg Hx     Social History Social History   Tobacco Use  . Smoking status: Never Smoker  . Smokeless tobacco: Never Used  Substance Use Topics  . Alcohol use: Yes    Alcohol/week: 0.0 oz    Comment: seldom.  . Drug use: No     Allergies   Aspirin   Review of Systems Review of Systems  Constitutional: Negative.   Respiratory: Negative.   Gastrointestinal: Negative.   Genitourinary: Negative.   Musculoskeletal: Negative for gait problem, joint swelling and myalgias.       As per HPI  Skin: Negative.   Neurological: Negative for dizziness, numbness and headaches.  All other systems reviewed and are negative.    Physical Exam Triage Vital Signs ED Triage Vitals [07/17/17 1238]    Enc Vitals Group     BP 133/70     Pulse Rate 84     Resp (!) 24     Temp 98.6 F (37 C)     Temp Source Oral     SpO2 95 %     Weight      Height      Head Circumference      Peak Flow      Pain Score      Pain Loc      Pain Edu?      Excl. in GC?    No data found.  Updated Vital  Signs BP 133/70 (BP Location: Left Arm) Comment (BP Location): large cuff, left forearm  Pulse 84   Temp 98.6 F (37 C) (Oral)   Resp (!) 24   SpO2 95%   Visual Acuity Right Eye Distance:   Left Eye Distance:   Bilateral Distance:    Right Eye Near:   Left Eye Near:    Bilateral Near:     Physical Exam  Constitutional: He is oriented to person, place, and time. He appears well-developed.  Neck: Neck supple.  Pulmonary/Chest: Effort normal.  Musculoskeletal: Normal range of motion.  Minor tenderness to a single point to the left side of the left knee. No laxity appreciated. Unable to reproduce the same pain for which the patient did not complain. Palpation of the left hip reveals no tenderness. Patient is currently having no pain. He is currently sitting and when standing and ambulating he is currently having no pain. No joint tenderness. Negative straight leg raise.  Neurological: He is oriented to person, place, and time.  Skin: Skin is warm and dry.  Nursing note and vitals reviewed.    UC Treatments / Results  Labs (all labs ordered are listed, but only abnormal results are displayed) Labs Reviewed - No data to display  EKG  EKG Interpretation None       Radiology No results found.  Procedures Procedures (including critical care time)  Medications Ordered in UC Medications - No data to display   Initial Impression / Assessment and Plan / UC Course  I have reviewed the triage vital signs and the nursing notes.  Pertinent labs & imaging results that were available during my care of the patient were reviewed by me and considered in my medical decision making (see  chart for details).    The description of pain most likely represents a sciatica type pain. Take the prednisone as directed. When taking, take with food. Follow-up with your primary care provider, make an appointment today.     Final Clinical Impressions(s) / UC Diagnoses   Final diagnoses:  Sciatica of left side  Hip pain, acute, left  Acute pain of left knee    ED Discharge Orders        Ordered    predniSONE (STERAPRED UNI-PAK 21 TAB) 10 MG (21) TBPK tablet     07/17/17 1317       Controlled Substance Prescriptions [place] Controlled Substance Registry consulted? Not Applicable   Hayden Rasmussen, NP 07/17/17 1326

## 2017-08-07 ENCOUNTER — Telehealth (HOSPITAL_COMMUNITY): Payer: Self-pay | Admitting: Pharmacist

## 2017-08-07 MED FILL — SPIRONOLACTONE 25 MG TABLET: 25 | 34 days supply | Qty: 34 | Fill #0

## 2017-08-07 MED FILL — hydrALAZINE HCL 50 MG TABS: 50 | 34 days supply | Qty: 102 | Fill #0

## 2017-08-07 MED FILL — TORSEMIDE 20 MG TABLET: 20 | 33 days supply | Qty: 100 | Fill #0

## 2017-08-07 MED FILL — DIGOXIN 250 MCG TAB: 250 | 34 days supply | Qty: 34 | Fill #0

## 2017-08-07 MED FILL — ATORVASTATIN 40 MG TABLET: 40 | 34 days supply | Qty: 34 | Fill #0

## 2017-08-07 MED FILL — LOSARTAN POTASSIUM 25 MG TA: 25 | 34 days supply | Qty: 34 | Fill #0

## 2017-08-07 MED FILL — ISOSORBIDE MN ER 60 MG TAB: 60 | 34 days supply | Qty: 34 | Fill #0

## 2017-08-07 MED FILL — AMIODARONE HCL 200 MG TAB: 200 | 34 days supply | Qty: 68 | Fill #0

## 2017-08-07 MED FILL — CARVEDILOL 3.125 MG TABLET: 3.125 | 34 days supply | Qty: 68 | Fill #0

## 2017-08-07 NOTE — Telephone Encounter (Signed)
Patient called stating that again his Rx insurance will not be active until Feb 2019 so will need HF fund until then. I have called and left a VM that we can fill one more month through HF fund and can assist with long term insurance issues if needed.   Tyler Deis. Bonnye Fava, PharmD, BCPS, CPP Clinical Pharmacist Pager: [phone] Phone: [phone] 08/07/2017 3:30 PM

## 2017-08-13 ENCOUNTER — Other Ambulatory Visit (HOSPITAL_COMMUNITY): Payer: Self-pay | Admitting: Pharmacist

## 2017-08-13 MED ORDER — ISOSORBIDE MONONITRATE ER 60 MG PO TB24: 60.0000 mg | ORAL_TABLET | Freq: Every day | ORAL | 3 refills | Status: DC

## 2017-08-13 MED ORDER — AMIODARONE HCL 200 MG PO TABS: 200.0000 mg | ORAL_TABLET | Freq: Two times a day (BID) | ORAL | 3 refills | Status: DC

## 2017-08-13 MED ORDER — CARVEDILOL 3.125 MG PO TABS: 3.1250 mg | ORAL_TABLET | Freq: Two times a day (BID) | ORAL | 3 refills | Status: DC

## 2017-08-13 MED ORDER — SILDENAFIL CITRATE 100 MG PO TABS: 100.0000 mg | ORAL_TABLET | Freq: Every day | ORAL | 3 refills | Status: DC | PRN

## 2017-08-13 MED ORDER — ATORVASTATIN CALCIUM 40 MG PO TABS: 40.0000 mg | ORAL_TABLET | Freq: Every day | ORAL | 3 refills | Status: DC

## 2017-08-13 MED ORDER — DIGOXIN 250 MCG PO TABS: 0.2500 mg | ORAL_TABLET | Freq: Every day | ORAL | 3 refills | Status: DC

## 2017-08-13 MED ORDER — TORSEMIDE 20 MG PO TABS: 20.0000 mg | ORAL_TABLET | ORAL | 3 refills | Status: DC

## 2017-08-13 MED ORDER — SPIRONOLACTONE 25 MG PO TABS: 25.0000 mg | ORAL_TABLET | Freq: Every day | ORAL | 3 refills | Status: DC

## 2017-08-13 MED ORDER — HYDRALAZINE HCL 50 MG PO TABS: 50.0000 mg | ORAL_TABLET | Freq: Three times a day (TID) | ORAL | 6 refills | Status: DC

## 2017-08-13 MED ORDER — LOSARTAN POTASSIUM 25 MG PO TABS: 25.0000 mg | ORAL_TABLET | Freq: Every day | ORAL | 3 refills | Status: DC

## 2017-08-14 ENCOUNTER — Encounter (HOSPITAL_COMMUNITY): Payer: Self-pay | Admitting: Family Medicine

## 2017-08-14 ENCOUNTER — Ambulatory Visit (HOSPITAL_COMMUNITY)
Admission: EM | Admit: 2017-08-14 | Discharge: 2017-08-14 | Disposition: A | Discharge status: Home / Self Care | Payer: Managed Care, Other (non HMO) | Attending: Family Medicine | Admitting: Family Medicine

## 2017-08-14 DIAGNOSIS — M25552 Pain in left hip: Secondary | ICD-10-CM | POA: Diagnosis not present

## 2017-08-14 DIAGNOSIS — M25562 Pain in left knee: Secondary | ICD-10-CM | POA: Diagnosis not present

## 2017-08-14 DIAGNOSIS — G8929 Other chronic pain: Secondary | ICD-10-CM

## 2017-08-14 NOTE — ED Triage Notes (Signed)
Pt here for recurrent left hip and knee pain.

## 2017-08-14 NOTE — ED Provider Notes (Signed)
[facility]    CSN: [id number] Arrival date & time: 08/14/17  1518     History   Chief Complaint Chief Complaint  Patient presents with  . Hip Pain  . Knee Pain    HPI Mark Baker is a 37 y.o. male.   37 yo male with morbid obesity presents with chronic left low back and left knee pain. He was seen for the same complaint in early December. Was given prednisone at that time and helped pain. He has not seen his PCP as directed. Patient has been taking ibuprofen with some relief. Pain is unchanged since last visit and no new injury.       Past Medical History:  Diagnosis Date  . Asthma    childhood asthma no exacerbation since age 89   . Chronic combined systolic and diastolic CHF (congestive heart failure) (HCC)    a) ECHO (11/2012): EF 40-45%, RV fx difficult to see, nl size b) ECHO (05/2014): EF 20-25%, grade 2 DD, RV mildly dilated and sys fx mod reduced  . Hypertension   . NICM (nonischemic cardiomyopathy) (HCC)    a) (11/2012): normal coronaries  . OSA (obstructive sleep apnea)     Patient Active Problem List   Diagnosis Date Noted  . Syncope 01/01/2017  . Erectile dysfunction 12/25/2016  . Encounter for therapeutic drug monitoring 11/19/2016  . Paroxysmal atrial fibrillation (HCC)   . Chronic HFrEF (heart failure with reduced ejection fraction) (HCC) 07/05/2016  . Morbid obesity with body mass index (BMI) of 60.0 to 69.9 in adult [facility]) 07/05/2016  . CKD (chronic kidney disease), stage III (HCC) 07/05/2016  . Hyperlipidemia 11/25/2012  . Prolonged QT interval 11/24/2012  . Healthcare maintenance 09/03/2012  . HTN (hypertension) 02/19/2012  . Sleep apnea 02/10/2012  . Morbid obesity (HCC) 02/10/2012    Past Surgical History:  Procedure Laterality Date  . ANTERIOR CRUCIATE LIGAMENT REPAIR     right  . CARDIOVERSION N/A 11/07/2016   Procedure: CARDIOVERSION;  Surgeon: Dolores Patty, MD;  Location: [facility];  Service: Cardiovascular;  Laterality:  N/A;  Will need extra help positioning patient  . LEFT HEART CATHETERIZATION WITH CORONARY ANGIOGRAM N/A 11/24/2012   Procedure: LEFT HEART CATHETERIZATION WITH CORONARY ANGIOGRAM;  Surgeon: Peter M Swaziland, MD;  Location: [facility] CATH LAB;  Service: Cardiovascular;  Laterality: N/A;  . RIGHT/LEFT HEART CATH AND CORONARY ANGIOGRAPHY N/A 03/18/2017   Procedure: Right/Left Heart Cath and Coronary Angiography;  Surgeon: Dolores Patty, MD;  [facility]: [facility] INVAS[facility] CV LAB;  Service: Cardiovascular;  Laterality: N/A;  . TEE WITHOUT CARDIOVERSION N/A 11/07/2016   Procedure: TRANSESOPHAGEAL ECHOCARDIOGRAM (TEE);  Surgeon: Dolores Patty, MD;  Location: [facility];  Service: Cardiovascular;  Laterality: N/A;       Home Medications    Prior to Admission medications   Medication Sig Start Date End Date Taking? Authorizing Provider  amiodarone (PACERONE) 200 MG tablet Take 1 tablet (200 mg total) by mouth 2 (two) times daily. 08/13/17   Bensimhon, Bevelyn Buckles, MD  atorvastatin (LIPITOR) 40 MG tablet Take 1 tablet (40 mg total) by mouth daily. 08/13/17   Bensimhon, Bevelyn Buckles, MD  carvedilol (COREG) 3.125 MG tablet Take 1 tablet (3.125 mg total) by mouth 2 (two) times daily. 08/13/17   Bensimhon, Bevelyn Buckles, MD  digoxin (LANOXIN) 0.25 MG tablet Take 1 tablet (0.25 mg total) by mouth daily. 08/13/17   Bensimhon, Bevelyn Buckles, MD  hydrALAZINE (APRESOLINE) 50 MG tablet Take 1 tablet (50 mg total) by  mouth every 8 (eight) hours. 08/13/17   Bensimhon, Bevelyn Buckles, MD  isosorbide mononitrate (IMDUR) 60 MG 24 hr tablet Take 1 tablet (60 mg total) by mouth daily. 08/13/17   Bensimhon, Bevelyn Buckles, MD  losartan (COZAAR) 25 MG tablet Take 1 tablet (25 mg total) by mouth at bedtime. 08/13/17   Bensimhon, Bevelyn Buckles, MD  predniSONE (STERAPRED UNI-PAK 21 TAB) 10 MG (21) TBPK tablet Dispense one 6 day pack. Take as directed with food. 07/17/17   Hayden Rasmussen, NP  sildenafil (VIAGRA) 100 MG tablet Take 1 tablet (100 mg total) by mouth daily as needed for  erectile dysfunction. 08/13/17   Bensimhon, Bevelyn Buckles, MD  spironolactone (ALDACTONE) 25 MG tablet Take 1 tablet (25 mg total) by mouth daily. 08/13/17   Bensimhon, Bevelyn Buckles, MD  torsemide (DEMADEX) 20 MG tablet Take 1-2 tablets (20-40 mg total) by mouth See admin instructions. Take 40 mg (2 Tablets) in the AM and 20 mg (1 Tablet) in the PM 08/13/17   Bensimhon, Bevelyn Buckles, MD  warfarin (COUMADIN) 7.5 MG tablet Take 1 tablet (7.5 mg total) by mouth daily at 6 PM. 02/11/17   Bensimhon, Bevelyn Buckles, MD    Family History Family History  Problem Relation Age of Onset  . Hypertension Father   . Heart failure Father        paternal side of family   . Hypertension Sister   . Stroke Maternal Grandmother   . Hypertension Unknown        maternal side of family   . Diabetes Neg Hx   . Hyperlipidemia Neg Hx     Social History Social History   Tobacco Use  . Smoking status: Never Smoker  . Smokeless tobacco: Never Used  Substance Use Topics  . Alcohol use: Yes    Alcohol/week: 0.0 oz    Comment: seldom.  . Drug use: No     Allergies   Aspirin   Review of Systems Review of Systems  Constitutional: Negative for activity change and appetite change.  HENT: Negative for congestion and ear discharge.   Eyes: Negative for discharge and itching.  Respiratory: Negative for apnea and chest tightness.   Cardiovascular: Negative for chest pain and leg swelling.  Gastrointestinal: Negative for abdominal distention and abdominal pain.  Endocrine: Negative for cold intolerance and heat intolerance.  Genitourinary: Negative for difficulty urinating and dysuria.  Musculoskeletal: Positive for back pain. Negative for arthralgias.       Left knee pain  Neurological: Negative for dizziness and headaches.  Hematological: Negative for adenopathy. Does not bruise/bleed easily.     Physical Exam Triage Vital Signs ED Triage Vitals  Enc Vitals Group     BP 08/14/17 1604 (!) 141/64     Pulse Rate 08/14/17  1604 86     Resp 08/14/17 1604 (!) 22     Temp 08/14/17 1604 98.3 F (36.8 C)     Temp src --      SpO2 08/14/17 1604 96 %     Weight --      Height --      Head Circumference --      Peak Flow --      Pain Score 08/14/17 1601 3     Pain Loc --      Pain Edu? --      Excl. in GC? --    No data found.  Updated Vital Signs BP (!) 141/64   Pulse 86   Temp 98.3 F (  36.8 C)   Resp (!) 22   SpO2 96%   Visual Acuity Right Eye Distance:   Left Eye Distance:   Bilateral Distance:    Right Eye Near:   Left Eye Near:    Bilateral Near:     Physical Exam  Constitutional: He appears well-developed and well-nourished. No distress.  HENT:  Head: Normocephalic and atraumatic.  Eyes: EOM are normal. Pupils are equal, round, and reactive to light.  Neck: Normal range of motion. Neck supple.  Cardiovascular: Intact distal pulses.  Pulmonary/Chest: Effort normal. No respiratory distress.  Musculoskeletal: Normal range of motion. He exhibits no tenderness.  Left knee: normal ROM, no erythema, no tenderness to palpation, negative for meniscal injury Negative straight leg raise     UC Treatments / Results  Labs (all labs ordered are listed, but only abnormal results are displayed) Labs Reviewed - No data to display  EKG  EKG Interpretation None       Radiology No results found.  Procedures Procedures (including critical care time)  Medications Ordered in UC Medications - No data to display   Initial Impression / Assessment and Plan / UC Course  I have reviewed the triage vital signs and the nursing notes.  Pertinent labs & imaging results that were available during my care of the patient were reviewed by me and considered in my medical decision making (see chart for details).    Pain appears to be chronic in nature. Advised patient to follow up with pcp for physical therapy and further evaluation. Weight loss would likely benefit his pain tremendously. Continue  ibuprofen prn.   Final Clinical Impressions(s) / UC Diagnoses   Final diagnoses:  Left hip pain  Chronic pain of left knee    ED Discharge Orders    None       Controlled Substance Prescriptions [place] Controlled Substance Registry consulted? Not Applicable   Rolm Bookbinder, DO 08/14/17 [phone]

## 2017-08-18 ENCOUNTER — Telehealth: Payer: Self-pay | Admitting: Licensed Clinical Social Worker

## 2017-08-18 ENCOUNTER — Other Ambulatory Visit: Payer: Self-pay | Admitting: Physician Assistant

## 2017-08-18 DIAGNOSIS — M25562 Pain in left knee: Secondary | ICD-10-CM

## 2017-08-18 NOTE — Telephone Encounter (Signed)
CSW left message for return call to assistance with prescription assistance. CSW will await return call. Lasandra Beech, LCSW, CCSW-MCS [phone]

## 2017-08-22 ENCOUNTER — Encounter (HOSPITAL_COMMUNITY): Payer: Self-pay | Admitting: *Deleted

## 2017-08-22 NOTE — Progress Notes (Signed)
Received paperwork from [employer] for patient's release to work.  Forms completed/signed and faxed today to [phone].  Original forms will be scanned to patient's electronic medical record.

## 2017-08-28 ENCOUNTER — Telehealth (HOSPITAL_COMMUNITY): Payer: Self-pay

## 2017-08-28 NOTE — Telephone Encounter (Signed)
Advanced Heart Failure Triage Encounter  Patient Name: Mark Baker  Date of Call: 08/28/17  Problem:  Pt called with c/o CP for x3 days w/ no relief. Pt is concerned and wanting an appt. He was advised we do not treat CP and should go to emergency room for cardiac w/u  Plan:    Teresa Coombs, RN

## 2017-09-08 ENCOUNTER — Inpatient Hospital Stay
Admission: RE | Admit: 2017-09-08 | Discharge: 2017-09-08 | Disposition: A | Discharge status: Home / Self Care | Payer: Managed Care, Other (non HMO) | Source: Ambulatory Visit | Attending: Physician Assistant | Admitting: Physician Assistant

## 2017-09-16 ENCOUNTER — Telehealth: Payer: Self-pay

## 2017-09-16 NOTE — Telephone Encounter (Signed)
Attempted to contact pt, LMOM TCB for appt or to notify us if he is off Coumadin or if someone else is currently managing/monitoring.  Pt has not been to our clinic for an INR check since 01/13/17!

## 2017-11-25 ENCOUNTER — Emergency Department (HOSPITAL_COMMUNITY): Payer: Managed Care, Other (non HMO)

## 2017-11-25 ENCOUNTER — Other Ambulatory Visit: Payer: Self-pay

## 2017-11-25 ENCOUNTER — Encounter (HOSPITAL_COMMUNITY): Payer: Self-pay | Admitting: Emergency Medicine

## 2017-11-25 ENCOUNTER — Observation Stay (HOSPITAL_COMMUNITY)
Admission: EM | Admit: 2017-11-25 | Discharge: 2017-11-26 | Disposition: A | Discharge status: Home / Self Care | Payer: Managed Care, Other (non HMO) | Attending: Internal Medicine | Admitting: Internal Medicine

## 2017-11-25 ENCOUNTER — Other Ambulatory Visit (HOSPITAL_COMMUNITY): Payer: Self-pay | Admitting: *Deleted

## 2017-11-25 DIAGNOSIS — I480 Paroxysmal atrial fibrillation: Secondary | ICD-10-CM | POA: Diagnosis not present

## 2017-11-25 DIAGNOSIS — Z8249 Family history of ischemic heart disease and other diseases of the circulatory system: Secondary | ICD-10-CM | POA: Diagnosis not present

## 2017-11-25 DIAGNOSIS — Z6841 Body Mass Index (BMI) 40.0 and over, adult: Secondary | ICD-10-CM | POA: Diagnosis not present

## 2017-11-25 DIAGNOSIS — N183 Chronic kidney disease, stage 3 (moderate): Secondary | ICD-10-CM | POA: Diagnosis not present

## 2017-11-25 DIAGNOSIS — Z7901 Long term (current) use of anticoagulants: Secondary | ICD-10-CM | POA: Diagnosis not present

## 2017-11-25 DIAGNOSIS — I272 Pulmonary hypertension, unspecified: Secondary | ICD-10-CM | POA: Insufficient documentation

## 2017-11-25 DIAGNOSIS — I428 Other cardiomyopathies: Secondary | ICD-10-CM | POA: Insufficient documentation

## 2017-11-25 DIAGNOSIS — I130 Hypertensive heart and chronic kidney disease with heart failure and stage 1 through stage 4 chronic kidney disease, or unspecified chronic kidney disease: Secondary | ICD-10-CM | POA: Diagnosis not present

## 2017-11-25 DIAGNOSIS — Z9111 Patient's noncompliance with dietary regimen: Secondary | ICD-10-CM

## 2017-11-25 DIAGNOSIS — R55 Syncope and collapse: Secondary | ICD-10-CM | POA: Diagnosis not present

## 2017-11-25 DIAGNOSIS — N179 Acute kidney failure, unspecified: Secondary | ICD-10-CM | POA: Insufficient documentation

## 2017-11-25 DIAGNOSIS — R791 Abnormal coagulation profile: Secondary | ICD-10-CM | POA: Diagnosis not present

## 2017-11-25 DIAGNOSIS — I5042 Chronic combined systolic (congestive) and diastolic (congestive) heart failure: Secondary | ICD-10-CM

## 2017-11-25 DIAGNOSIS — I110 Hypertensive heart disease with heart failure: Secondary | ICD-10-CM

## 2017-11-25 DIAGNOSIS — Z79899 Other long term (current) drug therapy: Secondary | ICD-10-CM

## 2017-11-25 DIAGNOSIS — G4733 Obstructive sleep apnea (adult) (pediatric): Secondary | ICD-10-CM | POA: Insufficient documentation

## 2017-11-25 DIAGNOSIS — I447 Left bundle-branch block, unspecified: Secondary | ICD-10-CM | POA: Insufficient documentation

## 2017-11-25 DIAGNOSIS — I43 Cardiomyopathy in diseases classified elsewhere: Secondary | ICD-10-CM

## 2017-11-25 DIAGNOSIS — E8770 Fluid overload, unspecified: Secondary | ICD-10-CM | POA: Diagnosis not present

## 2017-11-25 DIAGNOSIS — I5043 Acute on chronic combined systolic (congestive) and diastolic (congestive) heart failure: Secondary | ICD-10-CM

## 2017-11-25 DIAGNOSIS — Z886 Allergy status to analgesic agent status: Secondary | ICD-10-CM

## 2017-11-25 DIAGNOSIS — E785 Hyperlipidemia, unspecified: Secondary | ICD-10-CM | POA: Insufficient documentation

## 2017-11-25 HISTORY — DX: Obstructive sleep apnea (adult) (pediatric): G47.33

## 2017-11-25 HISTORY — DX: Dependence on other enabling machines and devices: Z99.89

## 2017-11-25 HISTORY — DX: Unspecified asthma, uncomplicated: J45.909

## 2017-11-25 HISTORY — DX: Migraine, unspecified, not intractable, without status migrainosus: G43.909

## 2017-11-25 LAB — CBC WITH DIFFERENTIAL/PLATELET
BASOS ABS: 0 10*3/uL (ref 0.0–0.1)
Basophils Relative: 0 %
EOS ABS: 0.1 10*3/uL (ref 0.0–0.7)
EOS PCT: 2 %
HCT: 44.1 % (ref 39.0–52.0)
HEMOGLOBIN: 13.8 g/dL (ref 13.0–17.0)
Lymphocytes Relative: 21 %
Lymphs Abs: 1.4 10*3/uL (ref 0.7–4.0)
MCH: 29.2 pg (ref 26.0–34.0)
MCHC: 31.3 g/dL (ref 30.0–36.0)
MCV: 93.4 fL (ref 78.0–100.0)
Monocytes Absolute: 0.6 10*3/uL (ref 0.1–1.0)
Monocytes Relative: 10 %
NEUTROS PCT: 67 %
Neutro Abs: 4.5 10*3/uL (ref 1.7–7.7)
PLATELETS: 203 10*3/uL (ref 150–400)
RBC: 4.72 MIL/uL (ref 4.22–5.81)
RDW: 12.7 % (ref 11.5–15.5)
WBC: 6.7 10*3/uL (ref 4.0–10.5)

## 2017-11-25 LAB — I-STAT ARTERIAL BLOOD GAS, ED
Acid-Base Excess: 6 mmol/L — ABNORMAL HIGH (ref 0.0–2.0)
Bicarbonate: 31.9 mmol/L — ABNORMAL HIGH (ref 20.0–28.0)
O2 Saturation: 95 %
PCO2 ART: 47.6 mmHg (ref 32.0–48.0)
PH ART: 7.435 (ref 7.350–7.450)
PO2 ART: 76 mmHg — AB (ref 83.0–108.0)
TCO2: 33 mmol/L — ABNORMAL HIGH (ref 22–32)

## 2017-11-25 LAB — BASIC METABOLIC PANEL
Anion gap: 9 (ref 5–15)
BUN: 8 mg/dL (ref 6–20)
CALCIUM: 8.6 mg/dL — AB (ref 8.9–10.3)
CHLORIDE: 105 mmol/L (ref 101–111)
CO2: 27 mmol/L (ref 22–32)
CREATININE: 1.13 mg/dL (ref 0.61–1.24)
GFR calc non Af Amer: >60 mL/min (ref >60)
Glucose, Bld: 99 mg/dL (ref 65–99)
Potassium: 3.6 mmol/L (ref 3.5–5.1)
Sodium: 141 mmol/L (ref 135–145)

## 2017-11-25 LAB — I-STAT CHEM 8, ED
BUN: 10 mg/dL (ref 6–20)
CREATININE: 1.2 mg/dL (ref 0.61–1.24)
Calcium, Ion: 1.06 mmol/L — ABNORMAL LOW (ref 1.15–1.40)
Chloride: 102 mmol/L (ref 101–111)
Glucose, Bld: 104 mg/dL — ABNORMAL HIGH (ref 65–99)
HCT: 45 % (ref 39.0–52.0)
Hemoglobin: 15.3 g/dL (ref 13.0–17.0)
POTASSIUM: 4.1 mmol/L (ref 3.5–5.1)
SODIUM: 141 mmol/L (ref 135–145)
TCO2: 32 mmol/L (ref 22–32)

## 2017-11-25 LAB — BRAIN NATRIURETIC PEPTIDE: B Natriuretic Peptide: 115.7 pg/mL — ABNORMAL HIGH (ref 0.0–100.0)

## 2017-11-25 LAB — I-STAT TROPONIN, ED: TROPONIN I, POC: 0.07 ng/mL (ref 0.00–0.08)

## 2017-11-25 LAB — HEMOGLOBIN A1C
Hgb A1c MFr Bld: 5 % (ref 4.8–5.6)
Mean Plasma Glucose: 96.8 mg/dL

## 2017-11-25 LAB — PROTIME-INR
INR: 1.08
PROTHROMBIN TIME: 13.9 s (ref 11.4–15.2)

## 2017-11-25 LAB — CBG MONITORING, ED: GLUCOSE-CAPILLARY: 113 mg/dL — AB (ref 65–99)

## 2017-11-25 LAB — D-DIMER, QUANTITATIVE (NOT AT ARMC): D DIMER QUANT: 0.39 ug{FEU}/mL (ref 0.00–0.50)

## 2017-11-25 MED ORDER — SACUBITRIL-VALSARTAN 49-51 MG PO TABS
1.0000 | ORAL_TABLET | Freq: Two times a day (BID) | ORAL | Status: DC
  Administered 2017-11-26: 1 via ORAL
  Filled 2017-11-25 (×2): qty 1

## 2017-11-25 MED ORDER — AMIODARONE HCL 200 MG PO TABS
200.0000 mg | ORAL_TABLET | Freq: Two times a day (BID) | ORAL | Status: DC
  Administered 2017-11-25 – 2017-11-26 (×2): 200 mg via ORAL
  Filled 2017-11-25 (×2): qty 1

## 2017-11-25 MED ORDER — SPIRONOLACTONE 25 MG PO TABS
25.0000 mg | ORAL_TABLET | Freq: Every day | ORAL | Status: DC
  Administered 2017-11-26: 25 mg via ORAL
  Filled 2017-11-25: qty 1

## 2017-11-25 MED ORDER — HYDRALAZINE HCL 50 MG PO TABS
50.0000 mg | ORAL_TABLET | Freq: Three times a day (TID) | ORAL | Status: DC
  Administered 2017-11-25 – 2017-11-26 (×3): 50 mg via ORAL
  Filled 2017-11-25 (×3): qty 1

## 2017-11-25 MED ORDER — ISOSORBIDE MONONITRATE ER 60 MG PO TB24
60.0000 mg | ORAL_TABLET | Freq: Every day | ORAL | Status: DC
  Administered 2017-11-26: 60 mg via ORAL
  Filled 2017-11-25: qty 1

## 2017-11-25 MED ORDER — DIGOXIN 125 MCG PO TABS
0.2500 mg | ORAL_TABLET | Freq: Every day | ORAL | Status: DC
  Administered 2017-11-26: 0.25 mg via ORAL
  Filled 2017-11-25: qty 2

## 2017-11-25 MED ORDER — WARFARIN SODIUM 7.5 MG PO TABS
7.5000 mg | ORAL_TABLET | Freq: Once | ORAL | Status: AC
  Administered 2017-11-25: 7.5 mg via ORAL
  Filled 2017-11-25: qty 1

## 2017-11-25 MED ORDER — FUROSEMIDE 10 MG/ML IJ SOLN
40.0000 mg | Freq: Once | INTRAMUSCULAR | Status: AC
  Administered 2017-11-25: 40 mg via INTRAVENOUS
  Filled 2017-11-25: qty 4

## 2017-11-25 MED ORDER — LOSARTAN POTASSIUM 25 MG PO TABS: 25.0000 mg | ORAL_TABLET | Freq: Every day | ORAL | Status: DC

## 2017-11-25 MED ORDER — ATORVASTATIN CALCIUM 40 MG PO TABS
40.0000 mg | ORAL_TABLET | Freq: Every day | ORAL | Status: DC
  Administered 2017-11-26: 40 mg via ORAL
  Filled 2017-11-25: qty 1

## 2017-11-25 MED ORDER — POTASSIUM CHLORIDE CRYS ER 20 MEQ PO TBCR
40.0000 meq | EXTENDED_RELEASE_TABLET | Freq: Once | ORAL | Status: AC
  Administered 2017-11-25: 40 meq via ORAL
  Filled 2017-11-25: qty 2

## 2017-11-25 MED ORDER — CARVEDILOL 3.125 MG PO TABS
3.1250 mg | ORAL_TABLET | Freq: Two times a day (BID) | ORAL | Status: DC
  Administered 2017-11-25 – 2017-11-26 (×3): 3.125 mg via ORAL
  Filled 2017-11-25 (×3): qty 1

## 2017-11-25 MED ORDER — WARFARIN - PHARMACIST DOSING INPATIENT: Freq: Every day | Status: DC

## 2017-11-25 NOTE — ED Provider Notes (Signed)
Patient presents to the emergency room after a syncopal episode.  EMS had to provide assisted ventilation when they first arrived.  Patient symptoms improved after treatment.  On exam the patient is morbidly obese.  He is somewhat somnolent.  He is high risk for cardiac dysrhythmia as well as obesity hypoventilation syndrome.  We will continue with ED workup and monitor closely    EKG Interpretation  Date/Time:  Tuesday November 25 2017 12:28:03 EDT Ventricular Rate:  77 PR Interval:    QRS Duration: 125 QT Interval:  435 QTC Calculation: 493 R Axis:   -20 Text Interpretation:  Sinus rhythm Probable left atrial enlargement Left bundle branch block No significant change since last tracing Confirmed by Linwood Dibbles [phone]) on 11/25/2017 12:51:28 PM         Linwood Dibbles, MD 11/25/17 1311

## 2017-11-25 NOTE — Plan of Care (Signed)
  Problem: Education: Goal: Knowledge of General Education information will improve Outcome: Progressing   Problem: Health Behavior/Discharge Planning: Goal: Ability to manage health-related needs will improve Outcome: Progressing   Problem: Activity: Goal: Risk for activity intolerance will decrease Outcome: Progressing   Problem: Nutrition: Goal: Adequate nutrition will be maintained Outcome: Progressing   Problem: Safety: Goal: Ability to remain free from injury will improve Outcome: Progressing

## 2017-11-25 NOTE — ED Triage Notes (Addendum)
Per EMS: pt from work with c/o CP preceding a syncopal episode that lasted approximately 15 minutes.  CP has been intermittent for greater than 2 weeks. Pt was sitting, no reported fall or injury.  EMS assisted ventilation during that time.  Pt woke up in ambulance en route to hospital.  No medications given pta.  Pt has a 16 G in L AC.

## 2017-11-25 NOTE — Progress Notes (Signed)
ANTICOAGULATION CONSULT NOTE - Initial Consult  Pharmacy Consult for heparin Indication: atrial fibrillation  Allergies  Allergen Reactions  . Aspirin Hives, Itching and Rash   Patient Measurements: Height: 6\' 3"  (190.5 cm) Weight: (!) 499 lb 12.8 oz (226.7 kg) IBW/kg (Calculated) : 84.5  Vital Signs: Temp: 97.8 F (36.6 C) (04/16 1756) Temp Source: Oral (04/16 1756) BP: 155/106 (04/16 1756) Pulse Rate: 89 (04/16 1756)  Labs: Recent Labs    11/25/17 1228 11/25/17 1303 11/25/17 1339  HGB 13.8 15.3  --   HCT 44.1 45.0  --   PLT 203  --   --   LABPROT  --   --  13.9  INR  --   --  1.08  CREATININE  --  1.20 1.13    Estimated Creatinine Clearance: 179 mL/min (by C-G formula based on SCr of 1.13 mg/dL).  Medical History: Past Medical History:  Diagnosis Date  . Asthma    childhood asthma no exacerbation since age 57   . Chronic combined systolic and diastolic CHF (congestive heart failure) (HCC)    a) ECHO (11/2012): EF 40-45%, RV fx difficult to see, nl size b) ECHO (05/2014): EF 20-25%, grade 2 DD, RV mildly dilated and sys fx mod reduced  . Hypertension   . NICM (nonischemic cardiomyopathy) (HCC)    a) (11/2012): normal coronaries  . OSA (obstructive sleep apnea)    Assessment: 37 yo M presents on 4/16 with CP and loss of consciousness. On Coumadin 3.75mg  daily exc for 7.5mg  on Mon/Wed/Fri PTA for Afib. INR low on admit at 1.08. Patient reported last dose was taken on 4/15. CBC stable.   Goal of Therapy:  INR 2-3 Monitor platelets by anticoagulation protocol: Yes   Plan:  Give Coumadin 7.5mg  PO x 1 Monitor daily INR, CBC, s/s of bleed Consider need to bridge with Lovenox or heparin   Enzo Bi, PharmD, BCPS Clinical Pharmacist Pager [phone] 11/25/2017 6:03 PM

## 2017-11-25 NOTE — ED Provider Notes (Addendum)
[facility] EMERGENCY DEPARTMENT Provider Note   CSN: [id number] Arrival date & time: 11/25/17  1220     History   Chief Complaint Chief Complaint  Patient presents with  . Chest Pain  . Loss of Consciousness    HPI Mark Baker is a 37 y.o. male` who presents by EMS for syncope.  He has a past medical history of super morbid obesity, chronic combined diastolic and systolic CHF, hypertension, obstructive sleep apnea, nonischemic cardiomyopathy, QT prolongation.  He is followed by Dr. Clarise Cruz.  Patient states that over the past several weeks he has had intermittent retrosternal sharp squeezing chest pain lasting up to 20 minutes at a time.  It is nonexertional, and comes and goes without provocation.  Patient denies a history of angina and does not take any nitroglycerin.  Patient was at work today when he had onset of the chest pain and lost consciousness.  He was seated in a chair and his coworkers helped him down to the ground.  He was unconscious for approximately 15 minutes without seizure-like activity.  EMS reports that due to his size they assisted ventilations while he was unconscious on the ground although he was breathing shallowly.  He did not lose pulses.  Patient is currently very sleepy and lethargic.  He continues to have 6 out of 10 retrosternal chest pain. Last echocardiogram was in July 2018, at that time the patient had an EF of 20-25% with diffuse hypokinesis.  Patient had a coronary artery catheterization in August 2018 which showed 20% stenosis of the RCA.  He denies a history of pulmonary embolism. HPI  Past Medical History:  Diagnosis Date  . Asthma    childhood asthma no exacerbation since age 22   . Chronic combined systolic and diastolic CHF (congestive heart failure) (HCC)    a) ECHO (11/2012): EF 40-45%, RV fx difficult to see, nl size b) ECHO (05/2014): EF 20-25%, grade 2 DD, RV mildly dilated and sys fx mod reduced  . Hypertension   . NICM  (nonischemic cardiomyopathy) (HCC)    a) (11/2012): normal coronaries  . OSA (obstructive sleep apnea)     Patient Active Problem List   Diagnosis Date Noted  . Syncope 01/01/2017  . Erectile dysfunction 12/25/2016  . Encounter for therapeutic drug monitoring 11/19/2016  . Paroxysmal atrial fibrillation (HCC)   . Chronic HFrEF (heart failure with reduced ejection fraction) (HCC) 07/05/2016  . Morbid obesity with body mass index (BMI) of 60.0 to 69.9 in adult [facility]) 07/05/2016  . CKD (chronic kidney disease), stage III (HCC) 07/05/2016  . Hyperlipidemia 11/25/2012  . Prolonged QT interval 11/24/2012  . Healthcare maintenance 09/03/2012  . HTN (hypertension) 02/19/2012  . Sleep apnea 02/10/2012  . Morbid obesity (HCC) 02/10/2012    Past Surgical History:  Procedure Laterality Date  . ANTERIOR CRUCIATE LIGAMENT REPAIR     right  . CARDIOVERSION N/A 11/07/2016   Procedure: CARDIOVERSION;  Surgeon: Dolores Patty, MD;  Location: [facility] OR;  Service: Cardiovascular;  Laterality: N/A;  Will need extra help positioning patient  . LEFT HEART CATHETERIZATION WITH CORONARY ANGIOGRAM N/A 11/24/2012   Procedure: LEFT HEART CATHETERIZATION WITH CORONARY ANGIOGRAM;  Surgeon: Peter M Swaziland, MD;  Location: [facility] CATH LAB;  Service: Cardiovascular;  Laterality: N/A;  . RIGHT/LEFT HEART CATH AND CORONARY ANGIOGRAPHY N/A 03/18/2017   Procedure: Right/Left Heart Cath and Coronary Angiography;  Surgeon: Dolores Patty, MD;  Location: [facility] INVASIVE CV LAB;  Service: Cardiovascular;  Laterality: N/A;  .  TEE WITHOUT CARDIOVERSION N/A 11/07/2016   Procedure: TRANSESOPHAGEAL ECHOCARDIOGRAM (TEE);  Surgeon: Dolores Patty, MD;  Location: [facility] OR;  Service: Cardiovascular;  Laterality: N/A;        Home Medications    Prior to Admission medications   Medication Sig Start Date End Date Taking? Authorizing Provider  amiodarone (PACERONE) 200 MG tablet Take 1 tablet (200 mg total) by mouth 2 (two) times daily.  08/13/17  Yes Bensimhon, Bevelyn Buckles, MD  atorvastatin (LIPITOR) 40 MG tablet Take 1 tablet (40 mg total) by mouth daily. 08/13/17  Yes Bensimhon, Bevelyn Buckles, MD  carvedilol (COREG) 3.125 MG tablet Take 1 tablet (3.125 mg total) by mouth 2 (two) times daily. 08/13/17  Yes Bensimhon, Bevelyn Buckles, MD  digoxin (LANOXIN) 0.25 MG tablet Take 1 tablet (0.25 mg total) by mouth daily. 08/13/17  Yes Bensimhon, Bevelyn Buckles, MD  hydrALAZINE (APRESOLINE) 50 MG tablet Take 1 tablet (50 mg total) by mouth every 8 (eight) hours. 08/13/17  Yes Bensimhon, Bevelyn Buckles, MD  isosorbide mononitrate (IMDUR) 60 MG 24 hr tablet Take 1 tablet (60 mg total) by mouth daily. 08/13/17  Yes Bensimhon, Bevelyn Buckles, MD  losartan (COZAAR) 25 MG tablet Take 1 tablet (25 mg total) by mouth at bedtime. 08/13/17  Yes Bensimhon, Bevelyn Buckles, MD  sildenafil (VIAGRA) 100 MG tablet Take 1 tablet (100 mg total) by mouth daily as needed for erectile dysfunction. 08/13/17  Yes Bensimhon, Bevelyn Buckles, MD  spironolactone (ALDACTONE) 25 MG tablet Take 1 tablet (25 mg total) by mouth daily. 08/13/17  Yes Bensimhon, Bevelyn Buckles, MD  torsemide (DEMADEX) 20 MG tablet Take 1-2 tablets (20-40 mg total) by mouth See admin instructions. Take 40 mg (2 Tablets) in the AM and 20 mg (1 Tablet) in the PM 08/13/17  Yes Bensimhon, Bevelyn Buckles, MD  warfarin (COUMADIN) 7.5 MG tablet Take 1 tablet (7.5 mg total) by mouth daily at 6 PM. 02/11/17  Yes Bensimhon, Bevelyn Buckles, MD  predniSONE (STERAPRED UNI-PAK 21 TAB) 10 MG (21) TBPK tablet Dispense one 6 day pack. Take as directed with food. Patient not taking: Reported on 11/25/2017 07/17/17   Hayden Rasmussen, NP    Family History Family History  Problem Relation Age of Onset  . Hypertension Father   . Heart failure Father        paternal side of family   . Hypertension Sister   . Stroke Maternal Grandmother   . Hypertension Unknown        maternal side of family   . Diabetes Neg Hx   . Hyperlipidemia Neg Hx     Social History Social History   Tobacco Use  .  Smoking status: Never Smoker  . Smokeless tobacco: Never Used  Substance Use Topics  . Alcohol use: Yes    Alcohol/week: 0.0 oz    Comment: seldom.  . Drug use: No     Allergies   Aspirin   Review of Systems Review of Systems  Ten systems reviewed and are negative for acute change, except as noted in the HPI.   Physical Exam Updated Vital Signs BP (!) 156/87   Pulse 73   Temp 98.2 F (36.8 C) (Oral)   Resp (!) 22   SpO2 95%   Physical Exam  Constitutional: He is oriented to person, place, and time. He appears well-developed and well-nourished. No distress.  lethargic  HENT:  Head: Normocephalic and atraumatic.  Eyes: Pupils are equal, round, and reactive to light. Conjunctivae are normal. No scleral  icterus.  Neck: Normal range of motion. Neck supple.  Unable to assess JVD due to body habitus  Cardiovascular: Normal rate and regular rhythm. Exam reveals distant heart sounds.  Pulmonary/Chest: No respiratory distress.  Distant and difficult to auscultate breath sounds  Abdominal: Soft. There is no tenderness.  Musculoskeletal: He exhibits no edema.  Neurological: He is oriented to person, place, and time.  Skin: Skin is warm and dry. He is not diaphoretic.  Psychiatric: His behavior is normal.  Nursing note and vitals reviewed.    ED Treatments / Results  Labs (all labs ordered are listed, but only abnormal results are displayed) Labs Reviewed  BASIC METABOLIC PANEL - Abnormal; Notable for the following components:      Result Value   Calcium 8.6 (*)    All other components within normal limits  CBG MONITORING, ED - Abnormal; Notable for the following components:   Glucose-Capillary 113 (*)    All other components within normal limits  I-STAT CHEM 8, ED - Abnormal; Notable for the following components:   Glucose, Bld 104 (*)    Calcium, Ion 1.06 (*)    All other components within normal limits  I-STAT ARTERIAL BLOOD GAS, ED - Abnormal; Notable for the  following components:   pO2, Arterial 76.0 (*)    Bicarbonate 31.9 (*)    TCO2 33 (*)    Acid-Base Excess 6.0 (*)    All other components within normal limits  CBC WITH DIFFERENTIAL/PLATELET  D-DIMER, QUANTITATIVE (NOT AT [facility])  PROTIME-INR  I-STAT TROPONIN, ED  CBG MONITORING, ED    EKG EKG Interpretation  Date/Time:  Tuesday November 25 2017 12:28:03 EDT Ventricular Rate:  77 PR Interval:    QRS Duration: 125 QT Interval:  435 QTC Calculation: 493 R Axis:   -20 Text Interpretation:  Sinus rhythm Probable left atrial enlargement Left bundle branch block No significant change since last tracing Confirmed by Linwood Dibbles [phone]) on 11/25/2017 12:51:28 PM Also confirmed by Linwood Dibbles [phone]), editor Sheppard Evens ([id number])  on 11/25/2017 1:18:19 PM   Radiology Dg Chest 1 View  Result Date: 11/25/2017 CLINICAL DATA:  Syncope.  Chest pain.  History of CHF. EXAM: CHEST  1 VIEW COMPARISON:  06/13/2017. FINDINGS: Cardiomegaly with diffuse bilateral pulmonary interstitial prominence consistent with CHF. Costophrenic angles and right lateral chest not imaged. No pneumothorax. IMPRESSION: Congestive heart failure with bilateral pulmonary interstitial edema. Electronically Signed   By: Maisie Fus  Register   On: 11/25/2017 12:43    Procedures Procedures (including critical care time)  Medications Ordered in ED Medications  furosemide (LASIX) injection 40 mg (40 mg Intravenous Given 11/25/17 1507)     Initial Impression / Assessment and Plan / ED Course  I have reviewed the triage vital signs and the nursing notes.  Pertinent labs & imaging results that were available during my care of the patient were reviewed by me and considered in my medical decision making (see chart for details).  Clinical Course as of Nov 26 1526  Tue Nov 25, 2017  1311 Bicarbonate(!): 31.9 [AH]  1311 TCO2(!): 33 [AH]  1315 Patient's oxygen is normal his bicarbonate is elevated suggestive of chronic hypercarbia.  I  suspect with the assisted ventilations given by EMS patient has blown off a lot of like we chronically retained bicarb from apnea and massive central obesity.   [AH]  1527 D-Dimer, Quant: 0.39 [AH]  1527 Hemoglobin: 13.8 [AH]  1528 INR subtherapeutic.  His hemoglobin is normal, no suggestion of GI  blood losses because of the patient's syncope  INR: 1.08 [AH]    Clinical Course User Index [AH] Arthor Captain, PA-C    Patient with acute on chronic CHF exacerbation, syncope.  No evidence of QT prolongation or changes on his EKG today.  Consider worsening heart failure as the potential etiology of his syncopal episode versus occult hypercarbia given the elevation in his bicarb on ABG.  Patient will be admitted by the internal medicine hospitalist service.  Suspect he will need a repeat echocardiogram and question potential for placement of AICD.  Patient is stable throughout his visit here in the emergency department.  Final Clinical Impressions(s) / ED Diagnoses   Final diagnoses:  Syncope and collapse  Acute on chronic combined systolic and diastolic congestive heart failure [facility])    ED Discharge Orders    None       Arthor Captain, PA-C 11/25/17 1527    Arthor Captain, PA-C 11/25/17 1528    Linwood Dibbles, MD 11/27/17 1130

## 2017-11-25 NOTE — H&P (Signed)
Date: 11/25/2017               Patient Name:  Mark Baker MRN: [id number]  DOB: [birth year] Age / Sex: 37 y.o., male   PCP: Sherren Mocha, MD         Medical Service: Internal Medicine Teaching Service         Attending Physician: Dr. Anne Shutter, MD    First Contact: Domingo Sep, MS4 Pager: [phone]  Second Contact: Dr. Eulah Pont Pager: [phone]       After Hours (After 5p/  First Contact Pager: [phone]  weekends / holidays): Second Contact Pager: [phone]   Chief Complaint: chest pain with loss of consciousness  History of Present Illness: Mark Baker is a 37 y.o. M with PMH NICM, chronic combined systolic and diastolic CHF (EF 60-10% 02/2017), OSA, obesity, paroxysmal atrial fibrillation presenting following an episode of loss of consciousness in the setting of intermittent chest pain. He states that over the past two weeks, he has experienced intermittent stabbing left-sided, nonradiating chest pain. Onset is random, not associated with activity, and is not associated with shortness of breath, nausea, vomiting, vision changes, hearing changes, or weakness. He has experienced several episodes daily, each lasting 15-20 min in duration and self-resolving. This morning, he continued to have chest pain but went about his normal daily routine. He alerted his wife and sibling that he was feeling poorly. While at work, he apparently approached his supervisor and asked for help, then felt dizzy, sat down and became unresponsive. No abnormal movements were noted at the time. EMS was called and assisted him with ventilation before he became responsive en route to the ED. These events were recounted to him by his supervisor, as he has no recollection of this. He denies any incontinence of urine or stool, residual weakness, confusion or change in sensation. He reports his chest pain is now resolved.  Mark Baker endorses eating more processed foods over the last several months. He denies  increased fluid intake and feels he is not more swollen from baseline since his shoes fit the same. He titrates his diuretic use and states he has not felt he needed to increase diuresis in recent weeks. He takes his medications as prescribed, uses a pill box and was able to recount all his medications by heart. He denies dyspnea on exertion, cough, pain with inspiration and orthopnea.  Meds:  Current Meds  Medication Sig  . amiodarone (PACERONE) 200 MG tablet Take 1 tablet (200 mg total) by mouth 2 (two) times daily.  Marland Kitchen atorvastatin (LIPITOR) 40 MG tablet Take 1 tablet (40 mg total) by mouth daily.  . carvedilol (COREG) 3.125 MG tablet Take 1 tablet (3.125 mg total) by mouth 2 (two) times daily.  . digoxin (LANOXIN) 0.25 MG tablet Take 1 tablet (0.25 mg total) by mouth daily.  . hydrALAZINE (APRESOLINE) 50 MG tablet Take 1 tablet (50 mg total) by mouth every 8 (eight) hours.  . isosorbide mononitrate (IMDUR) 60 MG 24 hr tablet Take 1 tablet (60 mg total) by mouth daily.  Marland Kitchen losartan (COZAAR) 25 MG tablet Take 1 tablet (25 mg total) by mouth at bedtime.  . sildenafil (VIAGRA) 100 MG tablet Take 1 tablet (100 mg total) by mouth daily as needed for erectile dysfunction.  Marland Kitchen spironolactone (ALDACTONE) 25 MG tablet Take 1 tablet (25 mg total) by mouth daily.  Marland Kitchen torsemide (DEMADEX) 20 MG tablet Take 1-2 tablets (20-40 mg total) by mouth See  admin instructions. Take 40 mg (2 Tablets) in the AM and 20 mg (1 Tablet) in the PM  . warfarin (COUMADIN) 7.5 MG tablet Take 1 tablet (7.5 mg total) by mouth daily at 6 PM.     Allergies: Allergies as of 11/25/2017 - Review Complete 11/25/2017  Allergen Reaction Noted  . Aspirin Hives, Itching, and Rash 10/21/2012   Past Medical History: Paroxysmal atrial fibrillation - on warfarin, has not had recent INR Obstructive sleep apnea - does not use CPAP consistently Nonischemic cardiomyopathy HFrEF - EF 20-25% 02/2017 Obesity Hypertension Hyperlipidemia CKD  II  Family History:  - father: hypertension, heart failure - mother: hypertension - maternal grandmother: stroke  Social History: Lives at home with wife and three daughters. Works in call center. Lives with a lot of stress but preferred not to elaborate. He feels safe and denies SI/HI. No history of tobacco use. Very seldom alcohol use (once every few months). Denies recreational drug use.   Review of Systems: Review of Systems  Constitutional: Negative for chills, diaphoresis, fever, malaise/fatigue and weight loss.  HENT: Negative for congestion, ear discharge, hearing loss, sinus pain, sore throat and tinnitus.   Eyes: Negative for blurred vision and double vision.  Respiratory: Negative for cough, hemoptysis and shortness of breath.   Cardiovascular: Positive for chest pain. Negative for palpitations, orthopnea, claudication, leg swelling and PND.  Gastrointestinal: Negative for abdominal pain, constipation, diarrhea, heartburn, nausea and vomiting.  Genitourinary: Negative for dysuria, frequency and urgency.  Musculoskeletal: Negative for myalgias.  Skin: Negative for itching and rash.  Neurological: Positive for dizziness and loss of consciousness. Negative for tingling, sensory change, focal weakness, seizures, weakness and headaches.  Psychiatric/Behavioral: Negative for substance abuse and suicidal ideas.   Physical Exam: Blood pressure (!) 171/104, pulse 78, temperature 98.2 F (36.8 C), temperature source Oral, resp. rate (!) 22, SpO2 96 %. General Appearance: Well developed, pleasant man in no acute distress sitting on side of bed with wife present. Skin: Inspection of the skin reveals no rashes, ulcerations or petechiae. HEENT: Sclerae anicteric and conjunctivae pink and moist. EOMs intact, PERRLA. External inspection of the ears and nose showed no scars, lesions, or masses. Lips, teeth, and gums showed normal mucosa. The oral mucosa, hard and soft palate, tongue and  posterior pharynx were normal. Neck: Supple and symmetric. No thyroid enlargement, tenderness or masses.  Lungs: Normal WOB, faint breath sounds without any wheezing or crackles. Cardiovascular: Distant heart sounds, normal rate, regular rhythm, no m/r/g. Peripheral pulses 2+ and symmetric. Abdomen: Soft and nontender with normal bowel sounds. Unable to palpate liver and spleen due to habitus Musculoskeletal: No joint tenderness or effusions noted. Muscle strength and tone normal. Extremities: 1+ pitting pretibial edema with no digital cyanosis or clubbing Neurologic: Alert and oriented x 4. CN II-XII grossly intact. HiNTs exam unremarkable. Sensation to touch intact. Strength 5/5 in bilateral upper and lower extremities.   EKG: personally reviewed my interpretation is LBBB consistent with prior EKG in 06/2017.   CXR: personally reviewed my interpretation is diffuse interstitial opacity, decreased inspiratory effort, cardiomegaly difficult to assess given AP film, unable to visualize costophrenic angles  Cardiac cath 03/2017:  1. Minimal non-obstructive CAD 2. Mild pulmonary HTN 3. NICM with EF ~25-30% though hard to assess EF on LV gram due to poor opacification  Echocardiogram 02/2017: Left ventricle: The cavity size was severely dilated. Wall thickness was increased in a pattern of mild LVH. Systolic function was severely reduced. The estimated ejection fraction was in  the range of 20% to 25%. Diffuse hypokinesis. Features are consistent with a pseudonormal left ventricular filling pattern, with concomitant abnormal relaxation and increased filling pressure (grade 2 diastolic dysfunction). Acoustic contrast opacification revealed no evidence of thrombus. - Left atrium: The atrium was severely dilated. - Right atrium: The atrium was mildly dilated.  Assessment & Plan by Problem: Active Problems:   Syncope  Mark Baker is a 37 y.o. M with PMH NICM, chronic combined systolic and diastolic  CHF (EF 20-25% 02/2017), OSA, obesity, paroxysmal atrial fibrillation presenting for loss of consciousness in the setting of intermittent chest pain.  Syncope in setting of HFrEF (EF 20-25%): Transient loss of consciousness in the setting of chest pain and poor contractility at baseline suggests a syncopal event of cardiac etiology. Given nonadherence to low Na diet, LE edema on exam and bilateral opacities on CXR, favor decompensated heart failure. His weight is up from baseline. Differential also includes arrhythmia given history of afib and predisposition to develop arrhythmias with CHF; however, EKG consistent with priors. ACS must be ruled out with cardiac enzyme trending as pt has known LBBB that may mask ischemia on EKG. INR subtherapeutic with history of afib; however, absence of tachycardia or pleuritic chest pain as well as normal D-dimer make pulmonary embolus less likely. Pulmonary etiology may be considered given OSA, but ABG shows no significant derangements consistent with hypercarbic or hypoxic event. Primary neurological etiology is also unlikely given no focal neurological deficit and negative HiNTs exam. No evidence of intoxication.  Workup: - Cardiology consulted, appreciate recs - trend troponin: 0.07 -->  - measure BNP - orthostatic VS Management: - furosemide 40 mg IV bid - continue home Imdur, spironolactone, carvedilol, digoxin, losartan, hydralazine - hold home torsemide - no aspirin given allergy history  Monitoring: - telemetry - strict I/Os  Paroxysmal atrial fibrillation: INR 1.08 with limited follow-up. Low suspicion for medication non-adherence as he was able to list his medications and doses from memory.  - continue home amiodarone 200 mg po bid - pharmacy consult for warfarin dosing - TSH (WNL 10/2016)  Obstructive sleep apnea: Polysomnography performed 2014, on CPAP at home with poor adherence. Currently saturating well on room air.  - CPAP  overnight  Hypertension: Hypertensive (140s-150s/80s-90s) in ED. - continue home hydralazine  Chronic kidney disease: Documented as CKDIII; however, GFR over past year has consistently been >60 with no proteinuria.  - continue home Imdur, spironolactone, carvedilol, losartan, hydralazine  Hyperlipidemia: - continue home atorvastatin  FEN/GI: F: none, pt tolerating po intake E: replete as needed N: HH GI: no regimen indicated at this time  DVT PPX: SCDs until therapeutic on warfarin  Dispo: Admit patient to Observation with expected length of stay less than 2 midnights.  Signed: Jake Seats, Medical Student 11/25/2017, 3:41 PM  Pager: [phone]  Attestation for Student Documentation:  I personally was present and performed or re-performed the history, physical exam and medical decision-making activities of this service and have verified that the service and findings are accurately documented in the student's note.  John Giovanni, MD 11/25/2017, 6:20 PM

## 2017-11-25 NOTE — Consult Note (Addendum)
Cardiology Consultation:   Patient ID: Feliciano Babcock; [id number]; 10/19/1980   Admit date: 11/25/2017 Date of Consult: 11/25/2017  Primary Care Provider: Sherren Mocha, MD Primary Cardiologist: [name], MD    Patient Profile:   Effie Kochanski is a 37 y.o. 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 Sandre Kitty, Internal Medicine.  History of Present Illness:   Mr. Barrick is followed by Dr. Gala Romney.  He has Nonischemic cardiomyopathy with EF of 20-25% combined systolic and diastolic heart failure.  He was admitted in March 2018 with atrial fibrillation with RVR and low cardiac output.  He was started on amiodarone and milrinone.TEE on 11/07/16 - EF 20% RV severely HK Massive LA (7.7cm), Severely dilated RA, Normal AV, Severe central MR, Moderate TR (RVSP 65-70), Trivial PR.  He spontaneously converted to NSR. He diuresed 44 pounds, discharge weight was 477 pounds. Discharged on torsemide 40mg  daily. Renal function inhibited starting ARB/ARNI.   Mid again in May 2018 with syncope.  The event was triggered by argument at home.  He was admitted and observed on telemetry which showed normal sinus rhythm.  He presented back to the [facility] ED earlier today with complaint of recurrent syncope.  Patient reports that he woke up feeling a bit "funny". He felt unwell and not like his usual self. He had also had dull left upper CP, non radiating. He went to work and continued to feel bad. He called his wife but notes he got in a argument with his wife. She was fusing at him about calling to make an appointment. When he got off the phone with her he told his co worker to call EMS due to his symptoms. He then sat in a chair and reportedly lost consciousness.  His coworkers noted that he was not responding to him. Pt notes he does not remember this. When he awoke, he felt fine.   He reports being fully compliant with meds. He denies any dyspnea. No LEE.      Past Medical History:  Diagnosis Date  . Asthma    childhood asthma no exacerbation since age 22   . Chronic combined systolic and diastolic CHF (congestive heart failure) (HCC)    a) ECHO (11/2012): EF 40-45%, RV fx difficult to see, nl size b) ECHO (05/2014): EF 20-25%, grade 2 DD, RV mildly dilated and sys fx mod reduced  . Hypertension   . NICM (nonischemic cardiomyopathy) (HCC)    a) (11/2012): normal coronaries  . OSA (obstructive sleep apnea)     Past Surgical History:  Procedure Laterality Date  . ANTERIOR CRUCIATE LIGAMENT REPAIR     right  . CARDIOVERSION N/A 11/07/2016   Procedure: CARDIOVERSION;  Surgeon: Dolores Patty, MD;  Location: [facility] OR;  Service: Cardiovascular;  Laterality: N/A;  Will need extra help positioning patient  . LEFT HEART CATHETERIZATION WITH CORONARY ANGIOGRAM N/A 11/24/2012   Procedure: LEFT HEART CATHETERIZATION WITH CORONARY ANGIOGRAM;  Surgeon: Peter M Swaziland, MD;  Location: [facility] CATH LAB;  Service: Cardiovascular;  Laterality: N/A;  . RIGHT/LEFT HEART CATH AND CORONARY ANGIOGRAPHY N/A 03/18/2017   Procedure: Right/Left Heart Cath and Coronary Angiography;  Surgeon: Dolores Patty, MD;  Location: [facility] INVASIVE CV LAB;  Service: Cardiovascular;  Laterality: N/A;  . TEE  WITHOUT CARDIOVERSION N/A 11/07/2016   Procedure: TRANSESOPHAGEAL ECHOCARDIOGRAM (TEE);  Surgeon: Dolores Patty, MD;  Location: [facility] OR;  Service: Cardiovascular;  Laterality: N/A;     Home Medications:  Prior to Admission medications   Medication Sig Start Date End Date Taking? Authorizing Provider  amiodarone (PACERONE) 200 MG tablet Take 1 tablet (200 mg total) by mouth 2 (two) times daily. 08/13/17  Yes Bensimhon, Bevelyn Buckles, MD  atorvastatin (LIPITOR) 40 MG tablet Take 1 tablet (40  mg total) by mouth daily. 08/13/17  Yes Bensimhon, Bevelyn Buckles, MD  carvedilol (COREG) 3.125 MG tablet Take 1 tablet (3.125 mg total) by mouth 2 (two) times daily. 08/13/17  Yes Bensimhon, Bevelyn Buckles, MD  digoxin (LANOXIN) 0.25 MG tablet Take 1 tablet (0.25 mg total) by mouth daily. 08/13/17  Yes Bensimhon, Bevelyn Buckles, MD  hydrALAZINE (APRESOLINE) 50 MG tablet Take 1 tablet (50 mg total) by mouth every 8 (eight) hours. 08/13/17  Yes Bensimhon, Bevelyn Buckles, MD  isosorbide mononitrate (IMDUR) 60 MG 24 hr tablet Take 1 tablet (60 mg total) by mouth daily. 08/13/17  Yes Bensimhon, Bevelyn Buckles, MD  losartan (COZAAR) 25 MG tablet Take 1 tablet (25 mg total) by mouth at bedtime. 08/13/17  Yes Bensimhon, Bevelyn Buckles, MD  sildenafil (VIAGRA) 100 MG tablet Take 1 tablet (100 mg total) by mouth daily as needed for erectile dysfunction. 08/13/17  Yes Bensimhon, Bevelyn Buckles, MD  spironolactone (ALDACTONE) 25 MG tablet Take 1 tablet (25 mg total) by mouth daily. 08/13/17  Yes Bensimhon, Bevelyn Buckles, MD  torsemide (DEMADEX) 20 MG tablet Take 1-2 tablets (20-40 mg total) by mouth See admin instructions. Take 40 mg (2 Tablets) in the AM and 20 mg (1 Tablet) in the PM 08/13/17  Yes Bensimhon, Bevelyn Buckles, MD  warfarin (COUMADIN) 7.5 MG tablet Take 1 tablet (7.5 mg total) by mouth daily at 6 PM. 02/11/17  Yes Bensimhon, Bevelyn Buckles, MD  predniSONE (STERAPRED UNI-PAK 21 TAB) 10 MG (21) TBPK tablet Dispense one 6 day pack. Take as directed with food. Patient not taking: Reported on 11/25/2017 07/17/17   Hayden Rasmussen, NP    Inpatient Medications: Scheduled Meds:  Continuous Infusions:  PRN Meds:   Allergies:    Allergies  Allergen Reactions  . Aspirin Hives, Itching and Rash    Social History:   Social History   Socioeconomic History  . Marital status: Married    Spouse name: Not on file  . Number of children: Not on file  . Years of education: Not on file  . Highest education level: Not on file  Occupational History  . Not on file  Social Needs  .  Financial resource strain: Not on file  . Food insecurity:    Worry: Not on file    Inability: Not on file  . Transportation needs:    Medical: Not on file    Non-medical: Not on file  Tobacco Use  . Smoking status: Never Smoker  . Smokeless tobacco: Never Used  Substance and Sexual Activity  . Alcohol use: Yes    Alcohol/week: 0.0 oz    Comment: seldom.  . Drug use: No  . 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   Lifestyle  . Physical activity:    Days per week: Not on file    Minutes per session: Not on file  . Stress: Not on file  Relationships  . Social connections:    Talks on phone:  Not on file    Gets together: Not on file    Attends religious service: Not on file    Active member of club or organization: Not on file    Attends meetings of clubs or organizations: Not on file    Relationship status: Not on file  . Intimate partner violence:    Fear of current or ex partner: Not on file    Emotionally abused: Not on file    Physically abused: Not on file    Forced sexual activity: Not on file  Other Topics Concern  . Not on file  Social History Narrative   Lives in [place] with wife of 4 years.  Works for [employer] at [employer]  1 son and 2 daughters.     Denies cigarettes. Drank 1 beer last night 11/22/12. Denies drugs           Family History:    Family History  Problem Relation Age of Onset  . Hypertension Father   . Heart failure Father        paternal side of family   . Hypertension Sister   . Stroke Maternal Grandmother   . Hypertension Unknown        maternal side of family   . Diabetes Neg Hx   . Hyperlipidemia Neg Hx      ROS:  Please see the history of present illness.   All other ROS reviewed and negative.     Physical Exam/Data:   Vitals:   11/25/17 1515 11/25/17 1530 11/25/17 1545 11/25/17 1645  BP:  (!) 171/104 (!) 177/98   Pulse: 79 78 81 80  Resp: (!) 22     Temp:      TempSrc:      SpO2: 96%  96% 95% 96%   No intake or output data in the 24 hours ending 11/25/17 1751 There were no vitals filed for this visit. There is no height or weight on file to calculate BMI.  General: Super morbid obesity, in no acute distress HEENT: normal Lymph: no adenopathy Neck: no JVD Endocrine:  No thryomegaly Vascular: No carotid bruits; FA pulses 2+ bilaterally without bruits  Cardiac: distant heart sounds given body habitus/super morbid obesity Lungs: Distant lung sounds given body habitus/ super morbid obesity Abd: soft, nontender, no hepatomegaly  Ext: trace bilateral LEE Musculoskeletal:  No deformities, BUE and BLE strength normal and equal Skin: warm and dry  Neuro:  CNs 2-12 intact, no focal abnormalities noted Psych:  Normal affect   EKG:  The EKG was personally reviewed and demonstrates:  NSR Telemetry:  Telemetry was personally reviewed and demonstrates:  NSR w/ LBBB  Relevant CV Studies: 2D Echo 02/2017 Study Conclusions  - Left ventricle: The cavity size was severely dilated. Wall   thickness was increased in a pattern of mild LVH. Systolic   function was severely reduced. The estimated ejection fraction   was in the range of 20% to 25%. Diffuse hypokinesis. Features are   consistent with a pseudonormal left ventricular filling pattern,   with concomitant abnormal relaxation and increased filling   pressure (grade 2 diastolic dysfunction). Acoustic contrast   opacification revealed no evidence ofthrombus. - Left atrium: The atrium was severely dilated. - Right atrium: The atrium was mildly dilated.  03/2017 Right/Left Heart Cath and Coronary Angiography  Conclusion     Mid RCA lesion, 20 %stenosed.   Findings:  Ao = 118/88 (97) LV = 132/28 RA = 9 RV = 54/9 PA =  51/20 (34) PCW = 22 Fick cardiac output/index = 9.9/3.2 PVR = 1.2 WU SVR = 708 dynes Ao sat = 88% PA sat = 68%,   Assessment: 1. Minimal non-obstructive CAD 2. Mild pulmonary HTN 3. NICM  with EF ~25-30% though hard to assess EF on LV gram due to poor opacification      Laboratory Data:  Chemistry Recent Labs  Lab 11/25/17 1303 11/25/17 1339  NA 141 141  K 4.1 3.6  CL 102 105  CO2  --  27  GLUCOSE 104* 99  BUN 10 8  CREATININE 1.20 1.13  CALCIUM  --  8.6*  GFRNONAA  --  >60  GFRAA  --  >60  ANIONGAP  --  9    No results for input(s): PROT, ALBUMIN, AST, ALT, ALKPHOS, BILITOT in the last 168 hours. Hematology Recent Labs  Lab 11/25/17 1228 11/25/17 1303  WBC 6.7  --   RBC 4.72  --   HGB 13.8 15.3  HCT 44.1 45.0  MCV 93.4  --   MCH 29.2  --   MCHC 31.3  --   RDW 12.7  --   PLT 203  --    Cardiac EnzymesNo results for input(s): TROPONINI in the last 168 hours.  Recent Labs  Lab 11/25/17 1301  TROPIPOC 0.07    BNPNo results for input(s): BNP, PROBNP in the last 168 hours.  DDimer  Recent Labs  Lab 11/25/17 1339  DDIMER 0.39    Radiology/Studies:  Dg Chest 1 View  Result Date: 11/25/2017 CLINICAL DATA:  Syncope.  Chest pain.  History of CHF. EXAM: CHEST  1 VIEW COMPARISON:  06/13/2017. FINDINGS: Cardiomegaly with diffuse bilateral pulmonary interstitial prominence consistent with CHF. Costophrenic angles and right lateral chest not imaged. No pneumothorax. IMPRESSION: Congestive heart failure with bilateral pulmonary interstitial edema. Electronically Signed   By: Maisie Fus  Register   On: 11/25/2017 12:43    Assessment and Plan:   1.  Syncope: pt notes prior history of syncope 1 year ago after having an argument with his wife.  Telemetry during that hospitalization showed normal sinus rhythm.  He was unable to wear outpatient heart monitor given issues with contract dermatitis with the monitor electrodes.  He have a history of atrial fibrillation but appears to be in normal sinus rhythm on EKG.  He also has known nonischemic cardiomyopathy with EF of 20-25%.  He felt unwell earlier this morning and also had mild left upper chest discomfort  described as a dull ache.  He also notes a similar situation today where his wife was arguing with him prior to his reported syncope.  Vital signs show patient is hypertensive at 155/106.  Hemoglobin is 15.3.  D-dimer negative. Serum creatinine is 1.13.  K 3.6.  Initial Troponin is negative.  Patient has been admitted by internal medicine.  He will be monitored overnight on telemetry.  Cardiac enzymes will be cycled x3. Resume home meds and monitor BP.   2. 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 He reports full med compliance at home. Difficult to assess volume status given body habits/ morbid obesity. Continue home HF regimen.   3. Chest Pain: currently pain free. EKG shows NSR with LBBB. Initial trop negative. D-dimer negative Cycle enzymes x 3.    For questions or updates, please contact [facility] Please consult [email or web address] for contact info under Cardiology/STEMI.   Signed, Robbie Lis, PA-C  11/25/2017 5:51 PM   The patient was  seen, examined and discussed with Brittainy M. Sharol Harness, PA-C and I agree with the above.   37 y.o. super morbidly obese male with a hx of ischemic cardiomyopathy with combined systolic and diastolic heart failure, EF of 16-10%, followed by the advanced heart failure clinic, obstructive sleep apnea and atrial fibrillation who is being seen today for the evaluation of syncope at the request of Dr. Sandre Kitty, Internal Medicine. He was followed by Dr Gala Romney, but no shows since Nov 2019. H/O Nonischemic cardiomyopathy with EF of 20-25% combined systolic and diastolic heart failure, minimal non-obstructive CAD on a cath in 03/2017.  He was admitted in March 2018 with atrial fibrillation with RVR and low cardiac output with cardiogenic shock.  He was started on amiodarone and short term milrinone.TEE on 11/07/16 - EF 20% RV severely HK Massive LA (7.7cm), Severely dilated RA, Normal AV, Severe central MR,  Moderate TR (RVSP 65-70), Trivial PR.  He spontaneously converted to NSR. He diuresed 44 pounds, discharge weight was 477 pounds. Discharged on torsemide 40mg  daily. Renal function inhibited starting ARB/ARNI.  Mid again in May 2018 with syncope.  The event was triggered by argument at home.  He was admitted and observed on telemetry which showed normal sinus rhythm. 30 day event monitor showed SR.   He presented back to the [facility] ED earlier today with complaint of recurrent syncope Mild CP at the time, no palpitations, currently asymptomatic, vitals stable, ECG SR, LBBB, troponin negative, D dimer negative, no fluid overload on physical exam. We will observe overnight, continue cycling troponin, ECGs, follow telemetry.  Repeat echo as none since 02/2017. We will hold losartan, switch to Entresto 51/49 po BID tomorrow.   Tobias Alexander, MD 11/25/2017

## 2017-11-26 ENCOUNTER — Encounter (HOSPITAL_COMMUNITY): Payer: Self-pay | Admitting: General Practice

## 2017-11-26 ENCOUNTER — Other Ambulatory Visit: Payer: Self-pay

## 2017-11-26 ENCOUNTER — Observation Stay (HOSPITAL_BASED_OUTPATIENT_CLINIC_OR_DEPARTMENT_OTHER): Payer: Managed Care, Other (non HMO)

## 2017-11-26 DIAGNOSIS — I130 Hypertensive heart and chronic kidney disease with heart failure and stage 1 through stage 4 chronic kidney disease, or unspecified chronic kidney disease: Secondary | ICD-10-CM | POA: Diagnosis not present

## 2017-11-26 DIAGNOSIS — I43 Cardiomyopathy in diseases classified elsewhere: Secondary | ICD-10-CM | POA: Diagnosis not present

## 2017-11-26 DIAGNOSIS — R791 Abnormal coagulation profile: Secondary | ICD-10-CM | POA: Diagnosis not present

## 2017-11-26 DIAGNOSIS — Z9114 Patient's other noncompliance with medication regimen: Secondary | ICD-10-CM

## 2017-11-26 DIAGNOSIS — I340 Nonrheumatic mitral (valve) insufficiency: Secondary | ICD-10-CM | POA: Diagnosis not present

## 2017-11-26 DIAGNOSIS — N179 Acute kidney failure, unspecified: Secondary | ICD-10-CM

## 2017-11-26 DIAGNOSIS — Z7901 Long term (current) use of anticoagulants: Secondary | ICD-10-CM

## 2017-11-26 DIAGNOSIS — N189 Chronic kidney disease, unspecified: Secondary | ICD-10-CM

## 2017-11-26 DIAGNOSIS — G4733 Obstructive sleep apnea (adult) (pediatric): Secondary | ICD-10-CM | POA: Diagnosis not present

## 2017-11-26 DIAGNOSIS — I480 Paroxysmal atrial fibrillation: Secondary | ICD-10-CM | POA: Diagnosis not present

## 2017-11-26 DIAGNOSIS — I5042 Chronic combined systolic (congestive) and diastolic (congestive) heart failure: Secondary | ICD-10-CM | POA: Diagnosis not present

## 2017-11-26 DIAGNOSIS — E669 Obesity, unspecified: Secondary | ICD-10-CM | POA: Diagnosis not present

## 2017-11-26 DIAGNOSIS — Z6841 Body Mass Index (BMI) 40.0 and over, adult: Secondary | ICD-10-CM

## 2017-11-26 DIAGNOSIS — I428 Other cardiomyopathies: Secondary | ICD-10-CM | POA: Diagnosis not present

## 2017-11-26 DIAGNOSIS — Z9989 Dependence on other enabling machines and devices: Secondary | ICD-10-CM

## 2017-11-26 DIAGNOSIS — Z888 Allergy status to other drugs, medicaments and biological substances status: Secondary | ICD-10-CM

## 2017-11-26 DIAGNOSIS — I447 Left bundle-branch block, unspecified: Secondary | ICD-10-CM

## 2017-11-26 DIAGNOSIS — R55 Syncope and collapse: Secondary | ICD-10-CM | POA: Diagnosis not present

## 2017-11-26 DIAGNOSIS — I5043 Acute on chronic combined systolic (congestive) and diastolic (congestive) heart failure: Secondary | ICD-10-CM | POA: Diagnosis not present

## 2017-11-26 LAB — COMPREHENSIVE METABOLIC PANEL
ALT: 19 U/L (ref 17–63)
AST: 18 U/L (ref 15–41)
Albumin: 3.5 g/dL (ref 3.5–5.0)
Alkaline Phosphatase: 44 U/L (ref 38–126)
Anion gap: 9 (ref 5–15)
BUN: 9 mg/dL (ref 6–20)
CO2: 28 mmol/L (ref 22–32)
Calcium: 8.6 mg/dL — ABNORMAL LOW (ref 8.9–10.3)
Chloride: 103 mmol/L (ref 101–111)
Creatinine, Ser: 1.31 mg/dL — ABNORMAL HIGH (ref 0.61–1.24)
GFR calc Af Amer: >60 mL/min (ref >60)
GFR calc non Af Amer: >60 mL/min (ref >60)
Glucose, Bld: 94 mg/dL (ref 65–99)
Potassium: 3.2 mmol/L — ABNORMAL LOW (ref 3.5–5.1)
Sodium: 140 mmol/L (ref 135–145)
Total Bilirubin: 2.2 mg/dL — ABNORMAL HIGH (ref 0.3–1.2)
Total Protein: 6.4 g/dL — ABNORMAL LOW (ref 6.5–8.1)

## 2017-11-26 LAB — DIGOXIN LEVEL: Digoxin Level: 0.8 ng/mL (ref 0.8–2.0)

## 2017-11-26 LAB — ECHOCARDIOGRAM COMPLETE
Height: 75 in
Weight: 7992 oz

## 2017-11-26 LAB — HIV ANTIBODY (ROUTINE TESTING W REFLEX): HIV Screen 4th Generation wRfx: NONREACTIVE

## 2017-11-26 LAB — PROTIME-INR
INR: 1.02
Prothrombin Time: 13.3 seconds (ref 11.4–15.2)

## 2017-11-26 LAB — TROPONIN I
Troponin I: 0.09 ng/mL (ref <0.03)
Troponin I: 0.08 ng/mL (ref <0.03)
Troponin I: 0.07 ng/mL (ref <0.03)

## 2017-11-26 LAB — TSH: TSH: 0.505 u[IU]/mL (ref 0.350–4.500)

## 2017-11-26 MED ORDER — FUROSEMIDE 10 MG/ML IJ SOLN
40.0000 mg | Freq: Two times a day (BID) | INTRAMUSCULAR | Status: DC
  Filled 2017-11-26: qty 4

## 2017-11-26 MED ORDER — ENOXAPARIN SODIUM 40 MG/0.4ML ~~LOC~~ SOLN
40.0000 mg | SUBCUTANEOUS | Status: DC
  Filled 2017-11-26: qty 0.4

## 2017-11-26 MED ORDER — WARFARIN SODIUM 10 MG PO TABS
10.0000 mg | ORAL_TABLET | Freq: Once | ORAL | Status: AC
  Administered 2017-11-26: 10 mg via ORAL
  Filled 2017-11-26: qty 1

## 2017-11-26 MED ORDER — POTASSIUM CHLORIDE CRYS ER 20 MEQ PO TBCR
60.0000 meq | EXTENDED_RELEASE_TABLET | Freq: Once | ORAL | Status: AC
  Administered 2017-11-26: 60 meq via ORAL
  Filled 2017-11-26: qty 3

## 2017-11-26 MED ORDER — ENOXAPARIN SODIUM 100 MG/ML ~~LOC~~ SOLN
100.0000 mg | SUBCUTANEOUS | Status: DC
  Administered 2017-11-26: 100 mg via SUBCUTANEOUS
  Filled 2017-11-26: qty 1

## 2017-11-26 MED ORDER — SACUBITRIL-VALSARTAN 49-51 MG PO TABS: 1.0000 | ORAL_TABLET | Freq: Two times a day (BID) | ORAL | 0 refills | Status: DC

## 2017-11-26 MED ORDER — TORSEMIDE 20 MG PO TABS
40.0000 mg | ORAL_TABLET | Freq: Once | ORAL | Status: AC
  Administered 2017-11-26: 40 mg via ORAL
  Filled 2017-11-26: qty 2

## 2017-11-26 MED ORDER — PERFLUTREN LIPID MICROSPHERE
1.0000 mL | INTRAVENOUS | Status: AC | PRN
  Administered 2017-11-26: 6 mL via INTRAVENOUS
  Filled 2017-11-26: qty 10

## 2017-11-26 MED ORDER — TORSEMIDE 20 MG PO TABS: 20.0000 mg | ORAL_TABLET | ORAL | Status: DC

## 2017-11-26 NOTE — Discharge Summary (Signed)
Name: Mark Baker MRN: [id number] DOB: [birth year] 37 y.o. PCP: Sherren Mocha, MD  Date of Admission: 11/25/2017 12:20 PM Date of Discharge: 11/26/2017 Attending Physician: Anne Shutter, MD  Discharge Diagnosis: Active Problems:   Syncope   Volume overload   Discharge Medications: Allergies as of 11/26/2017      Reactions   Entresto [sacubitril-valsartan] Cough   Was on Entresto 07/2016-09/2016   Aspirin Hives, Itching, Rash      Medication List    STOP taking these medications   losartan 25 MG tablet Commonly known as:  COZAAR   predniSONE 10 MG (21) Tbpk tablet Commonly known as:  STERAPRED UNI-PAK 21 TAB     TAKE these medications   amiodarone 200 MG tablet Commonly known as:  PACERONE Take 1 tablet (200 mg total) by mouth 2 (two) times daily.   atorvastatin 40 MG tablet Commonly known as:  LIPITOR Take 1 tablet (40 mg total) by mouth daily.   carvedilol 3.125 MG tablet Commonly known as:  COREG Take 1 tablet (3.125 mg total) by mouth 2 (two) times daily.   digoxin 0.25 MG tablet Commonly known as:  LANOXIN Take 1 tablet (0.25 mg total) by mouth daily.   hydrALAZINE 50 MG tablet Commonly known as:  APRESOLINE Take 1 tablet (50 mg total) by mouth every 8 (eight) hours.   isosorbide mononitrate 60 MG 24 hr tablet Commonly known as:  IMDUR Take 1 tablet (60 mg total) by mouth daily.   sacubitril-valsartan 49-51 MG Commonly known as:  ENTRESTO Take 1 tablet by mouth 2 (two) times daily.   sildenafil 100 MG tablet Commonly known as:  VIAGRA Take 1 tablet (100 mg total) by mouth daily as needed for erectile dysfunction.   spironolactone 25 MG tablet Commonly known as:  ALDACTONE Take 1 tablet (25 mg total) by mouth daily.   torsemide 20 MG tablet Commonly known as:  DEMADEX Take 1-2 tablets (20-40 mg total) by mouth See admin instructions. Take 40 mg (2 Tablets) in the AM and 20 mg (1 Tablet) in the PM   warfarin 7.5 MG tablet Commonly known  as:  COUMADIN Take as directed. If you are unsure how to take this medication, talk to your nurse or doctor. Original instructions:  Take 1 tablet (7.5 mg total) by mouth daily at 6 PM.       Disposition and follow-up:   [name] was discharged from [facility] in Stable condition.  At the hospital follow up visit please address:  1.  Syncope in setting of HFrEF (EF 20-25%): - f/u adequate home diuresis with torsemide  Paroxysmal afib with subtherapeutic INR: - INR check  AKI with hx of CKD: - check BMP to ensure resolution  2.  Labs / imaging needed at time of follow-up: none  3.  Pending labs/ test needing follow-up: none  Follow-up Appointments: Follow-up Information    [facility] Follow up on 12/10/2017.   Specialty:  Cardiology Why:  Heart Failure Follow Up @ [facility]-2:30pm-Parking at ER lot (blue awning next to ER), or [facility] on [address] ([address] entrance, garage code 1200, elevator to 1st floor). Take all am meds, bring all med bottles. Contact information: [address] [id number] mc [place] [address] [phone]       Sherren Mocha, MD. Go on 12/12/2017.   Specialty:  Family Medicine Why:  @3 :00pm Contact information: [address] [place] [address] [phone]  Hospital Course by problem list: Active Problems:   Syncope   Volume overload   Mark Baker is a 37 y.o. M with PMH NICM, chronic combined systolic and diastolic CHF (EF 16-10% 02/2017), OSA, obesity, paroxysmal atrial fibrillation presenting forloss of consciousness in the setting of intermittent chest pain.  1. Syncopein setting of HFrEF (EF 20-25%): Transient loss of consciousness in the setting of chest pain and poor contractility at baseline suggesting cardiogenic syncope. Given nonadherence to low Na diet, LE edema on exam, weight increase (up 30lbs from 06/2017)  and bilateral opacities on CXR, favor acute on chronic heart failure. Asymptomatic on presentation with stable EKG. Trops mildly elevated but downtrended. D-dimer WNL. No desaturation. No neurological deficits. Orthostatic VS WNL. Pt was treated with two doses of furosemide 40 mg IV with appropriate response. Cardiology was consulted and advised switching losartan to [facility] and recommended close follow-up with Dr. Gala Romney. Standing weight at the time of discharge was 799, this   2. History of paroxysmal afib with subtherapeutic INR: INR 1.02 with limited follow-up over past 6 mos. Requires close follow-up. Discharged with specific instructions: Continue Coumadin  7.5 mg daily until next outpatient INR check  3. Acute kidney injury with history of CKD: Patient experienced diuresis-induced bump in Cr from 1.13 on admission on 4/16 to 1.31 on 4/17. 1.31 from 1.13 overnight. GFR has been >60 and stable over past year.  4. Hypertension: Mildly hypertensive in hospital (140-150 systolic). Home hydralazine was continued.  Discharge Vitals:   BP (!) 170/100 (BP Location: Right Arm)   Pulse 86   Temp 98 F (36.7 C) (Oral)   Resp 20   Ht 6\' 3"  (1.905 m)   Wt (!) 499 lb 8 oz (226.6 kg) Comment: scale c  SpO2 97%   BMI 62.43 kg/m   Pertinent Labs, Studies, and Procedures:   EKG 4/16: LBBB consistent with prior EKG in 06/2017  TTE 4/17: - Left ventricle: The cavity size was severely dilated. Wall thickness was increased in a pattern of moderate LVH. Systolic function was severely reduced. The estimated ejection fraction was in the range of 20% to 25%. Diffuse hypokinesis. There is akinesis of the inferolateral, inferior, and inferoseptal myocardium. - Mitral valve: There was mild regurgitation. - Left atrium: The atrium was severely dilated. - Right atrium: The atrium was severely dilated. - No significant interval change compared to prior study  Discharge  Instructions: Discharge Instructions    Call MD for:  difficulty breathing, headache or visual disturbances   Complete by:  As directed    Call MD for:  extreme fatigue   Complete by:  As directed    Call MD for:  persistant dizziness or light-headedness   Complete by:  As directed    Diet - low sodium heart healthy   Complete by:  As directed    Increase activity slowly   Complete by:  As directed      Mark Baker, you were hospitalized for a syncopal episode (loss of consciousness), which we believe was secondary to your heart failure. While you were in the hospital, we helped get some fluid off of you, which should reduce the strain on your heart. We would like you to continue getting fluid off at home with your torsemide. You were seen by our cardiology team, who would like you to follow up with Dr. Gala Romney to manage your heart failure. They will set up an appointment for you. While in the hospital, you were also found  to be subtherapeutic on your warfarin, meaning your blood is thicker than it should be. Your INR needs to be checked frequently while we find the right warfarin dose for you. We would also like you to follow up with your primary care physician, Dr. Clelia Croft.  - Stop taking losartan  - Start taking Entresto twice daily   - please follow up with the heart failure clinic on May 1st - your coumadin level was found to be low, please schedule a follow up appointment with the coumadin clinic    Signed: Eulah Pont, MD 11/26/2017, 5:05 PM   Pager: [phone]

## 2017-11-26 NOTE — Progress Notes (Signed)
ANTICOAGULATION CONSULT NOTE - Follow Up Consult  Pharmacy Consult for Coumadin and Lovenox  Indication: atrial fibrillation and VTE prophylaxis dose of Lovenox  Allergies  Allergen Reactions  . Entresto [Sacubitril-Valsartan] Cough    Was on Entresto 07/2016-09/2016  . Aspirin Hives, Itching and Rash    Patient Measurements: Height: 6\' 3"  (190.5 cm) Weight: (!) 499 lb 8 oz (226.6 kg)(scale c) IBW/kg (Calculated) : 84.5 Lovenox Dosing Weight: 226 kg  Vital Signs: Temp: 97.6 F (36.4 C) (04/17 0515) Temp Source: Oral (04/17 0515) BP: 158/104 (04/17 0515) Pulse Rate: 80 (04/17 0515)  Labs: Recent Labs    11/25/17 1228 11/25/17 1303 11/25/17 1339 11/26/17 0010 11/26/17 0559 11/26/17 1024  HGB 13.8 15.3  --   --   --   --   HCT 44.1 45.0  --   --   --   --   PLT 203  --   --   --   --   --   LABPROT  --   --  13.9  --  13.3  --   INR  --   --  1.08  --  1.02  --   CREATININE  --  1.20 1.13  --  1.31*  --   TROPONINI  --   --   --  0.09* 0.08* 0.07*    Estimated Creatinine Clearance: 154.3 mL/min (A) (by C-G formula based on SCr of 1.31 mg/dL (H)).  Assessment:   37 yr old male continues on Coumadin for atrial fibrillation. Adding Lovenox for VTE prophylaxis dose for bridge.    INR 1.08>1.02 after Coumadin 7.5 mg x 1 last night.   Home Coumadin regimen: 7.5 mg MWF, 3.75 mg TTSS.   Patient reports compliance, but also that he hasn't had INR checked "in a while".   He also reports that he is expecting discharge today, and hx cough from [facility] in the past. Previously on [facility] 07/2016-09/2016.  Goal of Therapy:  INR 2-3 Lovenox VTE prophylaxis dose Monitor platelets by anticoagulation protocol: Yes   Plan:   Coumadin 10 mg x 1 today.  Lovenox 100 mg sq q24hrs (~0.5 mg/kg, but rounded to syringe size).  Daily PT/INR while here.  If discharged today, could take 11.25 mg Coumadin at home (1.5 x 7.5 mg tablet), then 7.5 mg daily until next outpatient INR  check.  Dennie Fetters, [place] Pager: [phone] 11/26/2017,12:03 PM

## 2017-11-26 NOTE — Progress Notes (Signed)
Progress Note  Patient Name: Mark Baker Date of Encounter: 11/26/2017  Primary Cardiologist: [name], MD   Subjective   Feeling well, wants to go home.  Inpatient Medications    Scheduled Meds: . amiodarone  200 mg Oral BID  . atorvastatin  40 mg Oral q1800  . carvedilol  3.125 mg Oral BID WC  . digoxin  0.25 mg Oral Daily  . enoxaparin (LOVENOX) injection  100 mg Subcutaneous Q24H  . furosemide  40 mg Intravenous BID  . hydrALAZINE  50 mg Oral Q8H  . isosorbide mononitrate  60 mg Oral Daily  . sacubitril-valsartan  1 tablet Oral BID  . spironolactone  25 mg Oral Daily  . warfarin  10 mg Oral ONCE-1800  . Warfarin - Pharmacist Dosing Inpatient   Does not apply q1800   Continuous Infusions:  PRN Meds: perflutren lipid microspheres (DEFINITY) IV suspension   Vital Signs    Vitals:   11/25/17 2022 11/26/17 0031 11/26/17 0515 11/26/17 1228  BP: (!) 136/95 131/71 (!) 158/104 (!) 170/100  Pulse: 81 72 80 86  Resp: 18 20 20 20   Temp: 98.8 F (37.1 C) 98 F (36.7 C) 97.6 F (36.4 C) 98 F (36.7 C)  TempSrc: Oral Oral Oral Oral  SpO2: 91% 94% 94% 97%  Weight:   (!) 499 lb 8 oz (226.6 kg)   Height:        Intake/Output Summary (Last 24 hours) at 11/26/2017 1240 Last data filed at 11/26/2017 0031 Gross per 24 hour  Intake 680 ml  Output 900 ml  Net -220 ml   Filed Weights   11/25/17 1756 11/26/17 0515  Weight: (!) 499 lb 12.8 oz (226.7 kg) (!) 499 lb 8 oz (226.6 kg)    Telemetry    SR - Personally Reviewed  ECG    SR - Personally Reviewed  Physical Exam  Morbidly obese GEN: No acute distress.   Neck: No JVD Cardiac: RRR, no murmurs, rubs, or gallops.  Respiratory: Clear to auscultation bilaterally. GI: Soft, nontender, non-distended  MS: No edema; No deformity. Neuro:  Nonfocal  Psych: Normal affect   Labs    Chemistry Recent Labs  Lab 11/25/17 1303 11/25/17 1339 11/26/17 0559  NA 141 141 140  K 4.1 3.6 3.2*  CL 102 105 103    CO2  --  27 28  GLUCOSE 104* 99 94  BUN 10 8 9   CREATININE 1.20 1.13 1.31*  CALCIUM  --  8.6* 8.6*  PROT  --   --  6.4*  ALBUMIN  --   --  3.5  AST  --   --  18  ALT  --   --  19  ALKPHOS  --   --  44  BILITOT  --   --  2.2*  GFRNONAA  --  >60 >60  GFRAA  --  >60 >60  ANIONGAP  --  9 9    Hematology Recent Labs  Lab 11/25/17 1228 11/25/17 1303  WBC 6.7  --   RBC 4.72  --   HGB 13.8 15.3  HCT 44.1 45.0  MCV 93.4  --   MCH 29.2  --   MCHC 31.3  --   RDW 12.7  --   PLT 203  --     Cardiac Enzymes Recent Labs  Lab 11/26/17 0010 11/26/17 0559 11/26/17 1024  TROPONINI 0.09* 0.08* 0.07*    Recent Labs  Lab 11/25/17 1301  TROPIPOC 0.07  BNP Recent Labs  Lab 11/25/17 1827  BNP 115.7*     DDimer  Recent Labs  Lab 11/25/17 1339  DDIMER 0.39     Radiology    Dg Chest 1 View  Result Date: 11/25/2017 CLINICAL DATA:  Syncope.  Chest pain.  History of CHF. EXAM: CHEST  1 VIEW COMPARISON:  06/13/2017. FINDINGS: Cardiomegaly with diffuse bilateral pulmonary interstitial prominence consistent with CHF. Costophrenic angles and right lateral chest not imaged. No pneumothorax. IMPRESSION: Congestive heart failure with bilateral pulmonary interstitial edema. Electronically Signed   By: Maisie Fus  Register   On: 11/25/2017 12:43    Cardiac Studies   TTE: 11/26/2017 - Left ventricle: The cavity size was severely dilated. Wall   thickness was increased in a pattern of moderate LVH. Systolic   function was severely reduced. The estimated ejection fraction   was in the range of 20% to 25%. Diffuse hypokinesis. There is   akinesis of the inferolateral, inferior, and inferoseptal   myocardium. - Mitral valve: There was mild regurgitation. - Left atrium: The atrium was severely dilated. - Right atrium: The atrium was severely dilated.  Impressions:  - Compared to the prior study, there has been no significant   interval change.  Patient Profile     37 y.o.  male  Assessment & Plan    1.  Syncope:  2. 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 He reports  3. Non-ischemic CMP.   37 y.o. super morbidly obese male with a hx of ischemic cardiomyopathy with combined systolic and diastolic heart failure, EF of 93-11%, followed by the advanced heart failure clinic, obstructive sleep apnea and atrial fibrillation who is being seen today for the evaluation of syncope at the request of Dr. Sandre Kitty, Internal Medicine. He was followed by Dr Gala Romney, but no shows since Nov 2019. H/O Nonischemic cardiomyopathy with EF of 20-25% combined systolic and diastolic heart failure, minimal non-obstructive CAD on a cath in 03/2017.  He was admitted in March 2018 with atrial fibrillation with RVR and low cardiac output with cardiogenic shock.  He was started on amiodarone and short term milrinone.TEE on 11/07/16 - EF 20% RV severely HK Massive LA (7.7cm), Severely dilated RA, Normal AV, Severe central MR, Moderate TR (RVSP 65-70), Trivial PR. He spontaneously converted to NSR. He diuresed 44 pounds, discharge weight was 477 pounds. Discharged on torsemide 40mg  daily. Renal function inhibited starting ARB/ARNI.  Mid again in May 2018 with syncope.  The event was triggered by argument at home.  He was admitted and observed on telemetry which showed normal sinus rhythm. 30 day event monitor showed SR.   He presented back to the [facility] ED earlier today with complaint of recurrent syncope Mild CP at the time, no palpitations, currently asymptomatic, vitals stable, ECG SR, LBBB, troponin negative, D dimer negative, no fluid overload on physical exam.  LVEF remains severely decreased at 20-25%. We held losartan, switch to Entresto 51/49 po BID tomorrow.  No arrhythmias overnight, the patient is asymptomatic, he can be discharged home, we will arrange for a follow up with Dr Gala Romney.    For questions or updates,  please contact [facility] Please consult [email or web address] for contact info under Cardiology/STEMI.      Signed, Tobias Alexander, MD  11/26/2017, 12:40 PM

## 2017-11-26 NOTE — Progress Notes (Addendum)
Subjective: Mark Baker feels "great" this morning and reports he feels well enough to leave the hospital today. He denies chest pain, SOB, nausea, vomiting, or any other complaints. He understands his INR is subtherapeutic and agrees to follow up with Dr. Gala Romney and his PCP Dr. Clelia Croft.   Objective:  Vital signs in last 24 hours: Vitals:   11/25/17 2022 11/26/17 0031 11/26/17 0515 11/26/17 1228  BP: (!) 136/95 131/71 (!) 158/104 (!) 170/100  Pulse: 81 72 80 86  Resp: 18 20 20 20   Temp: 98.8 F (37.1 C) 98 F (36.7 C) 97.6 F (36.4 C) 98 F (36.7 C)  TempSrc: Oral Oral Oral Oral  SpO2: 91% 94% 94% 97%  Weight:   (!) 226.6 kg (499 lb 8 oz)   Height:       Physical Exam: General Appearance: Well developed, well nourished, in no acute distress. Lungs: distant lung sounds, normal WOB without any wheezing or crackles Cardiovascular: distant heart sounds, normal rate, regular rhythm, no m/r/g. Peripheral pulses 2+ and symmetric. Abdomen: Soft and nontender with normal bowel sounds.  Extremities: Trace LE edema with no cyanosis or clubbing  Pertinent labs and imaging: Trop 0.09 --> 0.08 --> 0.07 INR 1.02 Digoxin WNL TSH WNL K 3.2 Cr elevated at 1.31  TTE 4/17: - Left ventricle: The cavity size was severely dilated. Wall   thickness was increased in a pattern of moderate LVH. Systolic   function was severely reduced. The estimated ejection fraction   was in the range of 20% to 25%. Diffuse hypokinesis. There is   akinesis of the inferolateral, inferior, and inferoseptal   myocardium. - Mitral valve: There was mild regurgitation. - Left atrium: The atrium was severely dilated. - Right atrium: The atrium was severely dilated. - No significant interval change compared to prior study   Assessment/Plan:  Active Problems:   Syncope   Volume overload  Mark Baker is a 37 y.o. M with PMH NICM, chronic combined systolic and diastolic CHF (EF 49-70% 02/2017), OSA, obesity,  paroxysmal atrial fibrillation presenting for loss of consciousness in the setting of intermittent chest pain.  Syncope in setting of HFrEF (EF 20-25%): Transient loss of consciousness in the setting of chest pain and poor contractility at baseline suggests a syncopal event of cardiac etiology. Given nonadherence to low Na diet, LE edema on exam, weight increase and bilateral opacities on CXR, favor decompensated heart failure. Pt asymptomatic with trops mildly elevated and downtrending, making ACS unlikely. PE unlikely (D-dimer WNL). No desaturation to suggest pulmonary etiology. No neurological deficits to suggest CVA. Orthostatic VS WNL. - per cardiology: switch losartan to [facility], continue HF meds and follow up with Dr. Gala Romney - continue home torsemide, spironolactone, carvedilol, digoxin, and hydralazine  History of paroxysmal afib with subtherapeutic INR: INR 1.02 with limited follow-up over past 6 mos. Low suspicion for warfarin non-adherence. May be re-dosed without heparin bridging. - per pharmacy: upon discharge today, 11.25 mg Coumadin at home (1.5 x 7.5 mg tablet), then 7.5 mg daily until next outpatient INR check  Acute kidney injury with history of CKD: Cr 1.31 from 1.13 overnight. Likely secondary to IV lasix diuresis given concurrent hypokalemia. GFR has been >60 and stable over past year. - switch IV lasix to home torsemide - BMP at follow-up to ensure resolution of AKI  Obstructive sleep apnea: Polysomnography performed 2014, on CPAP at home with poor adherence. Currently saturating well on room air.  - Continue CPAP at home  Hypertension: Hypertensive (140s-150s/80s-90s)  in hospital - continue home hydralazine and adjust as needed outpatient  Hyperlipidemia: - continue home atorvastatin  FEN/GI: F: none, pt tolerating po intake E: replete K N: HH GI: no regimen indicated at this time  DVT PPX: SCDs until therapeutic on warfarin  Dispo: Anticipated discharge  home today  Jake Seats, Medical Student 11/26/2017, 1:36 PM Pager: [phone]  Attestation for Student Documentation:  I personally was present and performed or re-performed the history, physical exam and medical decision-making activities of this service and have verified that the service and findings are accurately documented in the student's note.  Eulah Pont, MD 11/26/2017, 4:22 PM

## 2017-11-26 NOTE — Progress Notes (Signed)
Pt has orders to be discharged. Discharge instructions given and pt has no additional questions at this time. Medication regimen reviewed and pt educated. Pt verbalized understanding and has no additional questions. Telemetry box removed. IV removed and site in good condition. Pt stable and waiting for transportation. 

## 2017-11-26 NOTE — Progress Notes (Signed)
Pt. Troponin 0.09. On call for IMTS paged to make aware.

## 2017-11-26 NOTE — Progress Notes (Signed)
  Echocardiogram 2D Echocardiogram has been performed.  Mark Baker 11/26/2017, 11:15 AM

## 2017-11-26 NOTE — Discharge Instructions (Addendum)
Mark Baker, you were hospitalized for a syncopal episode (loss of consciousness), which we believe was secondary to your heart failure. While you were in the hospital, we helped get some fluid off of you, which should reduce the strain on your heart. We would like you to continue getting fluid off at home with your torsemide. You were seen by our cardiology team, who would like you to follow up with Dr. Gala Romney to manage your heart failure. They will set up an appointment for you. While in the hospital, you were also found to be subtherapeutic on your warfarin, meaning your blood is thicker than it should be. Your INR needs to be checked frequently while we find the right warfarin dose for you. We would also like you to follow up with your primary care physician, Dr. Clelia Croft.  - Stop taking losartan  - Start taking Entresto twice daily   - please follow up with the heart failure clinic on May 1st - your coumadin level was found to be low, please schedule a follow up appointment with the coumadin clinic   Information on my medicine - Coumadin   (Warfarin)  Why was Coumadin prescribed for you? Coumadin was prescribed for you because you have a blood clot or a medical condition that can cause an increased risk of forming blood clots. Blood clots can cause serious health problems by blocking the flow of blood to the heart, lung, or brain. Coumadin can prevent harmful blood clots from forming. As a reminder your indication for Coumadin is:   Stroke Prevention Because Of Atrial Fibrillation  What test will check on my response to Coumadin? While on Coumadin (warfarin) you will need to have an INR test regularly to ensure that your dose is keeping you in the desired range. The INR (international normalized ratio) number is calculated from the result of the laboratory test called prothrombin time (PT).  If an INR APPOINTMENT HAS NOT ALREADY BEEN MADE FOR YOU please schedule an appointment to have this lab  work done by your health care provider within 7 days. Your INR goal is usually a number between:  2 to 3 or your provider may give you a more narrow range like 2-2.5.  Ask your health care provider during an office visit what your goal INR is.  What  do you need to  know  About  COUMADIN? Take Coumadin (warfarin) exactly as prescribed by your healthcare provider about the same time each day.  DO NOT stop taking without talking to the doctor who prescribed the medication.  Stopping without other blood clot prevention medication to take the place of Coumadin may increase your risk of developing a new clot or stroke.  Get refills before you run out.  What do you do if you miss a dose? If you miss a dose, take it as soon as you remember on the same day then continue your regularly scheduled regimen the next day.  Do not take two doses of Coumadin at the same time.  Important Safety Information A possible side effect of Coumadin (Warfarin) is an increased risk of bleeding. You should call your healthcare provider right away if you experience any of the following: ? Bleeding from an injury or your nose that does not stop. ? Unusual colored urine (red or dark brown) or unusual colored stools (red or black). ? Unusual bruising for unknown reasons. ? A serious fall or if you hit your head (even if there is no bleeding).  Some foods or medicines interact with Coumadin (warfarin) and might alter your response to warfarin. To help avoid this: ? Eat a balanced diet, maintaining a consistent amount of Vitamin K. ? Notify your provider about major diet changes you plan to make. ? Avoid alcohol or limit your intake to 1 drink for women and 2 drinks for men per day. (1 drink is 5 oz. wine, 12 oz. beer, or 1.5 oz. liquor.)  Make sure that ANY health care provider who prescribes medication for you knows that you are taking Coumadin (warfarin).  Also make sure the healthcare provider who is monitoring your  Coumadin knows when you have started a new medication including herbals and non-prescription products.  Coumadin (Warfarin)  Major Drug Interactions  Increased Warfarin Effect Decreased Warfarin Effect  Alcohol (large quantities) Antibiotics (esp. Septra/Bactrim, Flagyl, Cipro) Amiodarone (Cordarone) Aspirin (ASA) Cimetidine (Tagamet) Megestrol (Megace) NSAIDs (ibuprofen, naproxen, etc.) Piroxicam (Feldene) Propafenone (Rythmol SR) Propranolol (Inderal) Isoniazid (INH) Posaconazole (Noxafil) Barbiturates (Phenobarbital) Carbamazepine (Tegretol) Chlordiazepoxide (Librium) Cholestyramine (Questran) Griseofulvin Oral Contraceptives Rifampin Sucralfate (Carafate) Vitamin K   Coumadin (Warfarin) Major Herbal Interactions  Increased Warfarin Effect Decreased Warfarin Effect  Garlic Ginseng Ginkgo biloba Coenzyme Q10 Green tea St. Johns wort    Coumadin (Warfarin) FOOD Interactions  Eat a consistent number of servings per week of foods HIGH in Vitamin K (1 serving =  cup)  Collards (cooked, or boiled & drained) Kale (cooked, or boiled & drained) Mustard greens (cooked, or boiled & drained) Parsley *serving size only =  cup Spinach (cooked, or boiled & drained) Swiss chard (cooked, or boiled & drained) Turnip greens (cooked, or boiled & drained)  Eat a consistent number of servings per week of foods MEDIUM-HIGH in Vitamin K (1 serving = 1 cup)  Asparagus (cooked, or boiled & drained) Broccoli (cooked, boiled & drained, or raw & chopped) Brussel sprouts (cooked, or boiled & drained) *serving size only =  cup Lettuce, raw (green leaf, endive, romaine) Spinach, raw Turnip greens, raw & chopped   These websites have more information on Coumadin (warfarin):  http://www.king-russell.com/; https://www.hines.net/;

## 2017-11-27 ENCOUNTER — Telehealth: Payer: Self-pay | Admitting: Family Medicine

## 2017-11-27 NOTE — Progress Notes (Signed)
    Spoke with [employer] company regarding the new Entresto medication which required pre-authorization. The medication has been approved from 10/28/2017-11/27/2018 with a case number of: [id number]. I personally called the pt and informed him of this and instructed him that this would now be covered under his insurance for the above timeframe.   Georgie Chard NP-C [facility] Pager: [phone]

## 2017-11-27 NOTE — Telephone Encounter (Signed)
Copied from CRM [id number]. Topic: Quick Communication - See Telephone Encounter >> Nov 27, 2017  3:43 PM Terisa Starr wrote: CRM for notification. See Telephone encounter for: 11/27/17.  Patient said that he was in the hospital from 4/16-4/17. He has a hospital f/u schedule for 5/3. He is asking for a note to be out of work 4/16-4/20. He contacted the hospital and they told him to call his primary. Please advise  Call back is [phone]

## 2017-11-27 NOTE — Progress Notes (Addendum)
11/27/17 15:40 Post DC note Notified by staff that patient called back bc he could not get his Entresto filled. (no CM consult) Called patient, called [facility] pharmacist provided with number for prior Berkley Harvey, provided to resident from IM clinic who will get auth and relay number back to me. I will call [facility] w number and also provide w $10 copay card. Patient updated.  16:09 Informed by IM Resident that they are unwilling to get Prior Auth, deferred to cardiology. Paged Dr Duke Salvia through Loretha Stapler, who stated Dr Delton See was still here (greyed out in Amion), Dr Delton See text paged, as well as NP Julien Girt. Spoke w Julien Girt who will call in prior auth. Prior Auth [id number] Plan ID [id number] Rx CCOP [id number]  Spoke w [facility], they will be on the lookout for prior auth, run the rx with the discount card, and notify patient when ready.

## 2017-11-28 NOTE — Telephone Encounter (Signed)
Left detailed VM advising that pt will need to be seen in order to get work note.

## 2017-12-02 ENCOUNTER — Other Ambulatory Visit (HOSPITAL_COMMUNITY): Payer: Self-pay | Admitting: *Deleted

## 2017-12-05 ENCOUNTER — Other Ambulatory Visit (HOSPITAL_COMMUNITY): Payer: Self-pay | Admitting: *Deleted

## 2017-12-05 MED ORDER — SILDENAFIL CITRATE 100 MG PO TABS: 100.0000 mg | ORAL_TABLET | Freq: Every day | ORAL | 3 refills | Status: DC | PRN

## 2017-12-05 MED ORDER — HYDRALAZINE HCL 50 MG PO TABS: 50.0000 mg | ORAL_TABLET | Freq: Three times a day (TID) | ORAL | 6 refills | Status: DC

## 2017-12-05 MED ORDER — ATORVASTATIN CALCIUM 40 MG PO TABS: 40.0000 mg | ORAL_TABLET | Freq: Every day | ORAL | 3 refills | Status: DC

## 2017-12-05 MED ORDER — DIGOXIN 250 MCG PO TABS: 0.2500 mg | ORAL_TABLET | Freq: Every day | ORAL | 3 refills | Status: DC

## 2017-12-05 MED ORDER — CARVEDILOL 3.125 MG PO TABS: 3.1250 mg | ORAL_TABLET | Freq: Two times a day (BID) | ORAL | 3 refills | Status: DC

## 2017-12-05 MED ORDER — SACUBITRIL-VALSARTAN 49-51 MG PO TABS: 1.0000 | ORAL_TABLET | Freq: Two times a day (BID) | ORAL | 3 refills | Status: DC

## 2017-12-05 MED ORDER — ISOSORBIDE MONONITRATE ER 60 MG PO TB24: 60.0000 mg | ORAL_TABLET | Freq: Every day | ORAL | 3 refills | Status: DC

## 2017-12-05 MED ORDER — AMIODARONE HCL 200 MG PO TABS: 200.0000 mg | ORAL_TABLET | Freq: Two times a day (BID) | ORAL | 3 refills | Status: DC

## 2017-12-05 MED ORDER — TORSEMIDE 20 MG PO TABS: 20.0000 mg | ORAL_TABLET | ORAL | 3 refills | Status: DC

## 2017-12-05 MED ORDER — SPIRONOLACTONE 25 MG PO TABS: 25.0000 mg | ORAL_TABLET | Freq: Every day | ORAL | 3 refills | Status: DC

## 2017-12-08 ENCOUNTER — Other Ambulatory Visit: Payer: Self-pay

## 2017-12-08 ENCOUNTER — Encounter: Payer: Self-pay | Admitting: Family Medicine

## 2017-12-08 ENCOUNTER — Ambulatory Visit (INDEPENDENT_AMBULATORY_CARE_PROVIDER_SITE_OTHER): Payer: Managed Care, Other (non HMO) | Admitting: Family Medicine

## 2017-12-08 VITALS — BP 160/90 | HR 98 | Temp 97.8°F | Resp 20 | Ht 75.0 in | Wt >= 6400 oz

## 2017-12-08 DIAGNOSIS — G8929 Other chronic pain: Secondary | ICD-10-CM | POA: Diagnosis not present

## 2017-12-08 DIAGNOSIS — I480 Paroxysmal atrial fibrillation: Secondary | ICD-10-CM | POA: Diagnosis not present

## 2017-12-08 DIAGNOSIS — Z7901 Long term (current) use of anticoagulants: Secondary | ICD-10-CM

## 2017-12-08 DIAGNOSIS — Z5181 Encounter for therapeutic drug level monitoring: Secondary | ICD-10-CM | POA: Diagnosis not present

## 2017-12-08 DIAGNOSIS — R55 Syncope and collapse: Secondary | ICD-10-CM

## 2017-12-08 DIAGNOSIS — R079 Chest pain, unspecified: Secondary | ICD-10-CM | POA: Diagnosis not present

## 2017-12-08 DIAGNOSIS — E559 Vitamin D deficiency, unspecified: Secondary | ICD-10-CM

## 2017-12-08 DIAGNOSIS — E8779 Other fluid overload: Secondary | ICD-10-CM

## 2017-12-08 DIAGNOSIS — Z6841 Body Mass Index (BMI) 40.0 and over, adult: Secondary | ICD-10-CM

## 2017-12-08 DIAGNOSIS — I5022 Chronic systolic (congestive) heart failure: Secondary | ICD-10-CM | POA: Diagnosis not present

## 2017-12-08 NOTE — Patient Instructions (Addendum)
IF you received an x-ray today, you will receive an invoice from [facility]. Please contact [facility] at [phone] with questions or concerns regarding your invoice.   IF you received labwork today, you will receive an invoice from [facility]. Please contact [facility] at [phone] with questions or concerns regarding your invoice.   Our billing staff will not be able to assist you with questions regarding bills from these companies.  You will be contacted with the lab results as soon as they are available. The fastest way to get your results is to activate your My Chart account. Instructions are located on the last page of this paperwork. If you have not heard from [place] regarding the results in 2 weeks, please contact this office.     Vitamin K Foods and Warfarin Warfarin is a blood thinner (anticoagulant). Anticoagulant medicines help prevent the formation of blood clots. These medicines work by decreasing the activity of vitamin K, which promotes normal blood clotting. When you take warfarin, problems can occur from suddenly increasing or decreasing the amount of vitamin K that you eat from one day to the next. Problems may include:  Blood clots.  Bleeding.  What general guidelines do I need to follow? To avoid problems when taking warfarin:  Eat a balanced diet that includes: ? Fresh fruits and vegetables. ? Whole grains. ? Low-fat dairy products. ? Lean proteins, such as fish, eggs, and lean cuts of meat.  Keep your intake of vitamin K consistent from day to day. To do this: ? Avoid eating large amounts of vitamin K one day and low amounts of vitamin K the next day. ? If you take a multivitamin that contains vitamin K, be sure to take it every day. ? Know which foods contain vitamin K. Use the lists below to understand serving sizes and the amount of vitamin K in one serving.  Avoid major changes in your diet. If you are going to change your diet,  talk with your health care provider before making changes.  Work with a Dealer (dietitian) to develop a meal plan that works best for you.  High vitamin K foods Foods that are high in vitamin K contain more than 100 mcg (micrograms) per serving. These include:  Broccoli (cooked) -  cup has 110 mcg.  Brussels sprouts (cooked) -  cup has 109 mcg.  Greens, beet (cooked) -  cup has 350 mcg.  Greens, collard (cooked) -  cup has 418 mcg.  Greens, turnip (cooked) -  cup has 265 mcg.  Green onions or scallions -  cup has 105 mcg.  Kale (fresh or frozen) -  cup has 531 mcg.  Parsley (raw) - 10 sprigs has 164 mcg.  Spinach (cooked) -  cup has 444 mcg.  Swiss chard (cooked) -  cup has 287 mcg.  Moderate vitamin K foods Foods that have a moderate amount of vitamin K contain 25-100 mcg per serving. These include:  Asparagus (cooked) - 5 spears have 38 mcg.  Black-eyed peas (dried) -  cup has 32 mcg.  Cabbage (cooked) -  cup has 37 mcg.  Kiwi fruit - 1 medium has 31 mcg.  Lettuce - 1 cup has 57-63 mcg.  Okra (frozen) -  cup has 44 mcg.  Prunes (dried) - 5 prunes have 25 mcg.  Watercress (raw) - 1 cup has 85 mcg.  Low vitamin K foods Foods low in vitamin K contain less than 25 mcg per serving. These include:  Artichoke - 1 medium has 18 mcg.  Avocado - 1 oz. has 6 mcg.  Blueberries -  cup has 14 mcg.  Cabbage (raw) -  cup has 21 mcg.  Carrots (cooked) -  cup has 11 mcg.  Cauliflower (raw) -  cup has 11 mcg.  Cucumber with peel (raw) -  cup has 9 mcg.  Grapes -  cup has 12 mcg.  Mango - 1 medium has 9 mcg.  Nuts - 1 oz. has 15 mcg.  Pear - 1 medium has 8 mcg.  Peas (cooked) -  cup has 19 mcg.  Pickles - 1 spear has 14 mcg.  Pumpkin seeds - 1 oz. has 13 mcg.  Sauerkraut (canned) -  cup has 16 mcg.  Soybeans (cooked) -  cup has 16 mcg.  Tomato (raw) - 1 medium has 10 mcg.  Tomato sauce -  cup has 17 mcg.  Vitamin  K-free foods If a food contain less than 5 mcg per serving, it is considered to have no vitamin K. These foods include:  Bread and cereal products.  Cheese.  Eggs.  Fish and shellfish.  Meat and poultry.  Milk and dairy products.  Sunflower seeds.  Actual amounts of vitamin K in foods may be different depending on processing. Talk with your dietitian about what foods you can eat and what foods you should avoid. This information is not intended to replace advice given to you by your health care provider. Make sure you discuss any questions you have with your health care provider. Document Released: 05/26/2009 Document Revised: 02/18/2016 Document Reviewed: 11/01/2015 Elsevier Interactive Patient Education  2018 [employer].  What You Need to Know About Warfarin Warfarin is a blood thinner (anticoagulant). Anticoagulants help to prevent the formation of blood clots. They also help to stop the growth of blood clots. Who should use warfarin? Warfarin is prescribed for people who are at risk for developing harmful blood clots, such as people who have:  Surgically implanted mechanical heart valves.  Irregular heart rhythms (atrial fibrillation).  Certain clotting disorders.  A history of harmful blood clotting in the past. This includes people who have had: ? A stroke. ? Blood clot in the lungs (pulmonary embolism, or PE). ? Blood clot in the legs (deep vein thrombosis, or DVT).  An existing blood clot.  How is warfarin taken?  Warfarin is a medicine that you take by mouth (orally). Warfarin tablets come in different strengths. Each tablet strength is a different color, with the amount of warfarin printed on the tablet. If you get a new prescription filled and the color of your tablet is different than usual, tell your pharmacist or health care provider immediately. What blood tests do I need while taking warfarin? The goal of warfarin therapy is to lessen the clotting  tendency of blood, but not to prevent clotting completely. Your health care provider will monitor the anticoagulation effect of warfarin closely and will adjust your dose as needed. Warfarin is a medicine that needs to be closely monitored, so it is very important to keep all lab visits and follow-up visits with your health care provider. While taking warfarin, you will need to have blood tests (prothrombin tests, or PT tests) regularly to measure your blood clotting time. This type of test can be done with a finger stick or a blood draw. What does the INR test result mean? The PT test results will be reported as the International Normalized Ratio (INR). The INR tells your health  care provider whether your dosage of warfarin needs to be changed. The longer it takes your blood to clot, the higher the INR. Your health care provider will tell you your target INR range. If your INR is not in your target range, your health care provider may adjust your dosage.  If your INR is above your target range, there is a risk of bleeding. Your dosage of warfarin may need to be decreased.  If your INR is below your target range, there is a risk of clotting. Your dosage of warfarin may need to be increased.  How often is the INR test needed?  When you first start warfarin, you will usually have your INR checked every few days.  You may need to have INR tests done more than once a week until you are taking the correct dosage of warfarin.  After you have reached your target INR, your INR will be tested less often. However, you will need to have your INR checked at least once every 4-6 weeks for the entire time you are taking warfarin. What are the side effects of warfarin? Too much warfarin can cause bleeding (hemorrhage) in any part of the body, such as:  Bleeding from the gums.  Unexplained bruises.  Bruises that get larger.  Blood in the urine.  Bloody or dark stools.  Bleeding in the brain  (hemorrhagic stroke).  A nosebleed that is not easily stopped.  Coughing up blood.  Vomiting blood.  Warfarin use may also cause:  Skin rash or irritations  Nausea that does not go away.  Severe pain in the back or joints.  Painful toes that turn blue or purple (purple toe syndrome).  Painful ulcers that do not go away (skin necrosis).  What are the signs and symptoms of a blood clot? Too little warfarin can increase the risk of blood clots in your legs, lungs, or arms. Signs and symptoms of a DVT in your leg or arm may include:  Pain or swelling in your leg or arm.  Skin that is red or warm to the touch on your arm or leg.  Signs and symptoms of a pulmonary embolism may include:  Shortness of breath or difficulty breathing.  Chest pain.  Unexplained fever.  What are the signs and symptoms of a stroke? If you are taking too much or too little warfarin, you can have a stroke. Signs and symptoms of a stroke may include:  Weakness or numbness of your face, arm, or leg, especially on one side of your body.  Confusion or trouble thinking clearly.  Difficulty seeing with one or both eyes.  Difficulty walking or moving your arms or legs.  Dizziness.  Loss of balance or coordination.  Trouble speaking, trouble understanding speech, or both (aphasia).  Sudden, severe headache with no known cause.  Partial or total loss of consciousness.  What precautions do I need to take while using warfarin?   Take warfarin exactly as told by your health care provider. Doing this helps you avoid bleeding or blood clots that could result in serious injury, pain, or disability.  Take your medicine at the same time every day. If you forget to take your dose of warfarin, take it as soon as you remember that day. If you do not remember on that day, do not take an extra dose the next day.  Contact your health care provider if you miss or take an extra dose. Do not change your dosage  on your own  to make up for missed or extra doses.  Wear or carry identification that says that you are taking warfarin.  Make sure that all health care providers, including your dentist, know you are taking warfarin.  If you need surgery, talk with your health care provider about whether you should stop taking warfarin before your surgery.  Avoid situations that cause bleeding. You may bleed more easily while taking warfarin. To limit bleeding, take the following actions: ? Use a softer toothbrush. ? Floss with waxed floss, not unwaxed floss. ? Shave with an electric razor, not with a blade. ? Limit your use of sharp objects. ? Avoid potentially harmful activities, such as contact sports. What do I need to know about warfarin and pregnancy or breastfeeding?  Warfarin is not recommended during the first trimester of pregnancy due to an increased risk of birth defects. In certain situations, a woman may take warfarin after her first trimester of pregnancy.  If you are taking warfarin and you become pregnant or plan to become pregnant, contact your health care provider right away.  If you plan to breastfeed while taking warfarin, talk with your health care provider first. What do I need to know about warfarin and alcohol or drug use?  Avoid drinking alcohol, or limit alcohol intake to no more than 1 drink a day for nonpregnant women and 2 drinks a day for men. One drink equals 12 oz of beer, 5 oz of wine, or 1 oz of hard liquor. ? If you change the amount of alcohol that you drink, tell your health care provider. Your warfarin dosage may need to be changed.  Avoid tobacco products, such as cigarettes, chewing tobacco, and e-cigarettes. If you need help quitting, ask your health care provider. ? If you change the amount of nicotine or tobacco that you use, tell your health care provider. Your warfarin dosage may need to be changed.  Avoid street drugs while taking warfarin. The effects of  street drugs on warfarin are not known. What do I need to know about warfarin and other medicines or supplements?  Many prescription and over-the-counter medicines can interfere with warfarin. Talk with your health care provider or your pharmacist before starting or stopping any new medicines. This includes over-the-counter vitamins, dietary supplements, herbal medicines, and pain medicines. Your warfarin dosage may need to be adjusted.  Some common over-the-counter medicines that may increase the risk of bleeding while taking warfarin include: ? Acetaminophen. ? Aspirin ? NSAIDs, such as ibuprofen or naproxen. ? Vitamin E. What do I need to know about warfarin and my diet?  It is important to maintain a normal, balanced diet while taking warfarin. Avoid major changes in your diet. If you are going to change your diet, talk with your health care provider before making changes.  Your health care provider may recommend that you work with a diet and nutrition specialist (dietitian).  Vitamin K decreases the effect of warfarin, and it is found in many foods. Eat a consistent amount of foods that contain vitamin K. For example, you may decide to eat 2 vitamin K-containing foods each day. Most foods that are high in vitamin K are green and leafy. Common foods that contain high amounts of vitamin K include:  Kale, raw or cooked.  Spinach, raw or cooked.  Collards, raw or cooked.  Swiss chard, raw or cooked.  Mustard greens, raw or cooked.  Turnip greens, raw or cooked.  Parsley, raw.  Broccoli, cooked.  Noodles, eggs, and  spinach, enriched.  Brussels sprouts, raw or cooked.  Beet greens, raw or cooked.  Endive, raw.  Cabbage, cooked.  Asparagus, cooked.  Foods that contain moderate amounts of vitamin K include:  Broccoli, raw.  Cabbage, raw.  Bok choy, cooked.  Green leaf lettuce, raw  Prunes, stewed.  Rosita [place].  Kiwi.  Edamame, cooked.  Romaine lettuce,  raw.  Avocado.  Tuna, canned in oil.  Okra, cooked.  Black-eyed peas, cooked.  Green beans, cooked or raw.  Blueberries, raw.  Blackberries, raw.  Peas, cooked or raw.  Contact a health care provider if:  You miss a dose.  You take an extra dose.  You plan to have any kind of surgery or procedure.  You are unable to take your medicine due to nausea, vomiting, or diarrhea.  You have any major changes in your diet or you plan to make any major changes in your diet.  You start or stop any over-the-counter medicine, prescription medicine, or dietary supplement.  You become pregnant, plan to become pregnant, or think you may be pregnant.  You have menstrual periods that are heavier than usual.  You have unusual bruising. Get help right away if:  You develop symptoms of an allergic reaction, such as: ? Swelling of the lips, face, tongue, mouth, or throat. ? Rash. ? Itching. ? Itchy, red, swollen areas of skin (hives). ? Trouble breathing. ? Chest tightness.  You have: ? Signs or symptoms of a stroke. ? Signs or symptoms of a blood clot. ? A fall or have an accident, especially if you hit your head. ? Blood in your urine. Your urine may look reddish, pinkish, or tea-colored. ? Blood in your stool. Your stool may be black or bright red. ? Bleeding that does not stop after applying pressure to the area for 30 minutes. ? Severe pain in your joints or back. ? Purple or blue toes. ? Skin ulcers that do not go away.  You vomit blood or cough up blood. The blood may be bright red, or it may look like coffee grounds. These symptoms may represent a serious problem that is an emergency. Do not wait to see if the symptoms will go away. Get medical help right away. Call your local emergency services (911 in the [place] Do not drive yourself to the hospital. Summary  Warfarin needs to be closely monitored with blood tests. It is very important to keep all lab visits and  follow-up visits with your health care provider.  Make sure that you know your target INR range and your warfarin dosage.  Wear or carry identification that says that you are taking warfarin.  Take warfarin at the same time every day. Call your health care provider if you miss a dose or if you take an extra dose. Do not change the dosage of warfarin on your own.  Know the signs and symptoms of blood clots, bleeding, and a stroke. Know when to get emergency medical help.  Tell all health care providers who care for you that you are taking warfarin.  Talk with your health care provider or your pharmacist before starting or stopping any new medicines.  Monitor how much vitamin K you eat every day. Try to eat the same amount every day. This information is not intended to replace advice given to you by your health care provider. Make sure you discuss any questions you have with your health care provider. Document Released: 07/29/2005 Document Revised: 04/09/2016 Document Reviewed: 10/25/2015 Elsevier Interactive  Patient Education  2017 [employer]  

## 2017-12-08 NOTE — Progress Notes (Signed)
Subjective:  By signing my name below, I, Mark Baker, attest that this documentation has been prepared under the direction and in the presence of Delman Cheadle, MD. Electronically Signed: [name], Festus. 12/08/2017 , 12:29 PM .  Patient was seen in Room 1 .   Patient ID: Mark Baker, male    DOB: Jun 21, 1981, 37 y.o.   MRN: [id number] Chief Complaint  Patient presents with  . Hospitalization Follow-up    Syncope and Collapse, Pt states he was in a chair and didn't fall down. Pt states he is feeling fine.    HPI Mark Baker is a 37 y.o. male who presents to Primary Care at [facility] for hospitalization follow up. Last saw patient 8 months prior, and this is 3rd visit with me. He also mentions not getting a return-to-work note from the hospital; but he returned to work on April 23rd so needs one from me today.   Syncopal episode of unknown etiology - presumed vasovagal He was hospitalized o/n on observation from 4/16 to 4/17 after having another syncopal episode which pt reports a long-standing h/o. He was noted to have volume overload, as well as mild acute kidney injury with creatinine bump to 1.3, eGFR still >60, hypokalemia of 3.2 due to diuresis in hospital. His protein albumin level was on the low side as well, and BNP was just mildly elevated at 115.   Anticoag For paroxysmal a. Fib. He isn't followed by INR clinic. He was taking Warfarin 7.'5mg'$  on Mondays, Wednesdays and Fridays, and then half tablets on Sundays, Tuesdays, Thursdays and Saturdays; now he is taking Warfarin 7.'5mg'$  QD. He used to go to an INR clinic across from [facility]. He informs he was going regularly, and follow ups became 1 month, 3 months, and 6 months.   Severe CHF s/p mild exacerbation He has an appointment with cardiology scheduled 2 days ago and he has an appointment with cardiology, [facility] in 2 days, but patient states he only has Mondays off work so he is going to have to  reschedule that. He was prescribed Entresto again at hosp d/c despite it being on his "allergy" list -  has coughs with it so he hasn't filled it yet, and wants to defer until he sees cardiology to discuss whether he really has to restart or if he can avoid or an alternative.   Chronic recurrent chest pain of unknown etiology He also reports having flare ups of chest pain about 3 times in a typical month. He usually stays out for that day, up to 2 days a week. The chest pain impacts and causes him to stay out of work. He works as an Web designer. He has FMLA forms to file.   Past Medical History:  Diagnosis Date  . Childhood asthma    no exacerbation since age 61   . Chronic combined systolic and diastolic CHF (congestive heart failure) ([place])    a) ECHO (11/2012): EF 40-45%, RV fx difficult to see, nl size b) ECHO (05/2014): EF 20-25%, grade 2 DD, RV mildly dilated and sys fx mod reduced  . Hypertension   . Migraine    "down to maybe 2/year now; used to have them q other week" (11/26/2017)  . NICM (nonischemic cardiomyopathy) ([place])    a) (11/2012): normal coronaries  . OSA on CPAP    Past Surgical History:  Procedure Laterality Date  . CARDIOVERSION N/A 11/07/2016   Procedure: CARDIOVERSION;  Surgeon: Shaune Pascal  Bensimhon, MD;  Location: [place];  Service: Cardiovascular;  Laterality: N/A;  Will need extra help positioning patient  . KNEE ARTHROSCOPY Right 2003   ANTERIOR CRUCIATE LIGAMENT REPAIR   . LEFT HEART CATHETERIZATION WITH CORONARY ANGIOGRAM N/A 11/24/2012   Procedure: LEFT HEART CATHETERIZATION WITH CORONARY ANGIOGRAM;  Surgeon: Peter M Martinique, MD;  Location: [facility] CATH LAB;  Service: Cardiovascular;  Laterality: N/A;  . RIGHT/LEFT HEART CATH AND CORONARY ANGIOGRAPHY N/A 03/18/2017   Procedure: Right/Left Heart Cath and Coronary Angiography;  Surgeon: Jolaine Artist, MD;  Location: [place] CV LAB;  Service: Cardiovascular;  Laterality: N/A;  . TEE WITHOUT CARDIOVERSION N/A  11/07/2016   Procedure: TRANSESOPHAGEAL ECHOCARDIOGRAM (TEE);  Surgeon: Jolaine Artist, MD;  [facility] OR;  Service: Cardiovascular;  Laterality: N/A;   Prior to Admission medications   Medication Sig Start Date End Date Taking? Authorizing Provider  amiodarone (PACERONE) 200 MG tablet Take 1 tablet (200 mg total) by mouth 2 (two) times daily. 12/05/17  Yes Bensimhon, Shaune Pascal, MD  atorvastatin (LIPITOR) 40 MG tablet Take 1 tablet (40 mg total) by mouth daily. 12/05/17  Yes Bensimhon, Shaune Pascal, MD  carvedilol (COREG) 3.125 MG tablet Take 1 tablet (3.125 mg total) by mouth 2 (two) times daily. 12/05/17  Yes Bensimhon, Shaune Pascal, MD  digoxin (LANOXIN) 0.25 MG tablet Take 1 tablet (0.25 mg total) by mouth daily. 12/05/17  Yes Bensimhon, Shaune Pascal, MD  hydrALAZINE (APRESOLINE) 50 MG tablet Take 1 tablet (50 mg total) by mouth every 8 (eight) hours. 12/05/17  Yes Bensimhon, Shaune Pascal, MD  isosorbide mononitrate (IMDUR) 60 MG 24 hr tablet Take 1 tablet (60 mg total) by mouth daily. 12/05/17  Yes Bensimhon, Shaune Pascal, MD  sacubitril-valsartan (ENTRESTO) 49-51 MG Take 1 tablet by mouth 2 (two) times daily. 12/05/17  Yes Bensimhon, Shaune Pascal, MD  sildenafil (VIAGRA) 100 MG tablet Take 1 tablet (100 mg total) by mouth daily as needed for erectile dysfunction. 12/05/17  Yes Bensimhon, Shaune Pascal, MD  spironolactone (ALDACTONE) 25 MG tablet Take 1 tablet (25 mg total) by mouth daily. 12/05/17  Yes Bensimhon, Shaune Pascal, MD  torsemide (DEMADEX) 20 MG tablet Take 1-2 tablets (20-40 mg total) by mouth See admin instructions. Take 40 mg (2 Tablets) in the AM and 20 mg (1 Tablet) in the PM 12/05/17  Yes Bensimhon, Shaune Pascal, MD  warfarin (COUMADIN) 7.5 MG tablet Take 1 tablet (7.5 mg total) by mouth daily at 6 PM. 02/11/17  Yes Bensimhon, Shaune Pascal, MD   Allergies  Allergen Reactions  . Entresto [Sacubitril-Valsartan] Cough    Was on Entresto 07/2016-09/2016  . Aspirin Hives, Itching and Rash   Family History  Problem Relation  Age of Onset  . Hypertension Father   . Heart failure Father        paternal side of family   . Hypertension Sister   . Stroke Maternal Grandmother   . Hypertension Unknown        maternal side of family   . Diabetes Neg Hx   . Hyperlipidemia Neg Hx    Social History   Socioeconomic History  . Marital status: Married    Spouse name: Not on file  . Number of children: Not on file  . Years of education: Not on file  . Highest education level: Not on file  Occupational History  . Not on file  Social Needs  . Financial resource strain: Not on file  . Food insecurity:    Worry:  Not on file    Inability: Not on file  . Transportation needs:    Medical: Not on file    Non-medical: Not on file  Tobacco Use  . Smoking status: Never Smoker  . Smokeless tobacco: Never Used  Substance and Sexual Activity  . Alcohol use: Yes    Alcohol/week: 0.0 oz    Comment: 11/26/2017 "nothing since 07/11/2017"  . Drug use: Never  . Sexual activity: Yes    Comment: Works in Engineer, technical sales at [employer].   Lifestyle  . Physical activity:    Days per week: Not on file    Minutes per session: Not on file  . Stress: Not on file  Relationships  . Social connections:    Talks on phone: Not on file    Gets together: Not on file    Attends religious service: Not on file    Active member of club or organization: Not on file    Attends meetings of clubs or organizations: Not on file    Relationship status: Not on file  Other Topics Concern  . Not on file  Social History Narrative   Lives in [place] with wife of 4 years.  Works for [employer] at [employer].  1 son and 2 daughters.     Denies cigarettes. Drank 1 beer last night 11/22/12. Denies drugs          Depression screen [facility] 2/9 12/08/2017 04/03/2017 12/16/2016 01/18/2015 01/16/2015  Decreased Interest 0 0 0 0 0  Down, Depressed, Hopeless 0 0 0 0 0  PHQ - 2 Score 0 0 0 0 0    Review of Systems  Constitutional: Negative for fatigue and unexpected  weight change.  Eyes: Negative for visual disturbance.  Respiratory: Negative for cough, chest tightness and shortness of breath.   Cardiovascular: Negative for chest pain, palpitations and leg swelling.  Gastrointestinal: Negative for abdominal pain and Baker in stool.  Neurological: Negative for dizziness, light-headedness and headaches.       Objective:   Physical Exam  Constitutional: He is oriented to person, place, and time. He appears well-developed and well-nourished. No distress.  HENT:  Head: Normocephalic and atraumatic.  Eyes: Pupils are equal, round, and reactive to light. EOM are normal.  Neck: Neck supple.  Cardiovascular: Normal rate.  Pulmonary/Chest: Effort normal. No respiratory distress.  Musculoskeletal: Normal range of motion.  Neurological: He is alert and oriented to person, place, and time.  Skin: Skin is warm and dry.  Psychiatric: He has a normal mood and affect. His behavior is normal.  Nursing note and vitals reviewed.   BP (!) 160/90 (BP Location: Left Arm, Patient Position: Sitting, Cuff Size: Large)   Pulse 98   Temp 97.8 F (36.6 C) (Oral)   Resp 20   Ht '6\' 3"'$  (1.905 m)   Wt (!) 497 lb 9.6 oz (225.7 kg)   BMI 62.20 kg/m      Assessment & Plan:   1. Other hypervolemia - sev lbs down and by d/c bmp looks to be back to dry weight - difficult to assess clinically due to morbid obesity so recheck bnp to confirm as was mildly elevated at 115.7 on hosp admission 4/16  2. Vasovagal syncope   3. Chronic HFrEF (heart failure with reduced ejection fraction) (HCC) - back on prior home dose of torsemide 40 qam and 20 qlunch. Need to discuss restarting entresto w/ cardiology asap but currently probably still a little on the dry side so I  think it is reasonable to hold off on starting for just sev d until speaking w/ cards as it is on his allergy list though sounds like pt just had a cough w/ it prior - however, it was annoying enough that he stopped med and  isn't to keen on restarting.  4. Morbid obesity with body mass index (BMI) of 60.0 to 69.9 in adult (HCC)   5. Paroxysmal atrial fibrillation ([place])   6. Medication monitoring encounter   7. Hypocalcemia - check vitamin D  8. Long term current use of anticoagulant therapy - inr today within goal at 2.4 - cont warfarin 7.'5mg'$  qd - he will re-establish with his prior coumadin clinic "across [facility] from the [facility]" for ongoing management - needs to recheck there w/in the month.  9. Chronic chest pain - requests to have forms completed for his work - like Event organiser but different for their HR as he has not been there a yr - estimates that he usually uses ~5d/mo max for time off due to his episodic left-sided anginal type chest pain of unknown etiology - when it suddenly hits him, unable to go to get out of bed due to severity, also needs occ time off due to CHF exacerbations when very short of breath at rest and    10. Recurrent chest pain     Orders Placed This Encounter  Procedures  . Comprehensive metabolic panel  . VITAMIN D 25 Hydroxy (Vit-D Deficiency, Fractures)  . Protime-INR  . Brain natriuretic peptide  . Brain natriuretic peptide      I personally performed the services described in this documentation, which was scribed in my presence. The recorded information has been reviewed and considered, and addended by me as needed.   Delman Cheadle, M.D.  Primary Care at [facility] [address] [place], [place] [address] [phone] phone [phone] fax  12/10/17 1:25 AM

## 2017-12-09 LAB — COMPREHENSIVE METABOLIC PANEL
A/G RATIO: 1.6 (ref 1.2–2.2)
ALK PHOS: 50 IU/L (ref 39–117)
ALT: 21 IU/L (ref 0–44)
AST: 20 IU/L (ref 0–40)
Albumin: 4.3 g/dL (ref 3.5–5.5)
BUN/Creatinine Ratio: 8 — ABNORMAL LOW (ref 9–20)
BUN: 11 mg/dL (ref 6–20)
Bilirubin Total: 2.1 mg/dL — ABNORMAL HIGH (ref 0.0–1.2)
CO2: 26 mmol/L (ref 20–29)
CREATININE: 1.35 mg/dL — AB (ref 0.76–1.27)
Calcium: 8.9 mg/dL (ref 8.7–10.2)
Chloride: 101 mmol/L (ref 96–106)
GFR calc Af Amer: 77 mL/min/{1.73_m2} (ref >59)
GFR calc non Af Amer: 67 mL/min/{1.73_m2} (ref >59)
Globulin, Total: 2.7 g/dL (ref 1.5–4.5)
Glucose: 101 mg/dL — ABNORMAL HIGH (ref 65–99)
POTASSIUM: 3.8 mmol/L (ref 3.5–5.2)
SODIUM: 145 mmol/L — AB (ref 134–144)
Total Protein: 7 g/dL (ref 6.0–8.5)

## 2017-12-09 LAB — BRAIN NATRIURETIC PEPTIDE: BNP: 44.7 pg/mL (ref 0.0–100.0)

## 2017-12-09 LAB — VITAMIN D 25 HYDROXY (VIT D DEFICIENCY, FRACTURES): VIT D 25 HYDROXY: 5.1 ng/mL — AB (ref 30.0–100.0)

## 2017-12-09 LAB — PROTIME-INR
INR: 2.5 — AB (ref 0.8–1.2)
Prothrombin Time: 24.6 s — ABNORMAL HIGH (ref 9.1–12.0)

## 2017-12-10 ENCOUNTER — Encounter (HOSPITAL_COMMUNITY): Payer: Managed Care, Other (non HMO)

## 2017-12-10 DIAGNOSIS — E559 Vitamin D deficiency, unspecified: Secondary | ICD-10-CM | POA: Insufficient documentation

## 2017-12-10 MED ORDER — VITAMIN D (ERGOCALCIFEROL) 1.25 MG (50000 UNIT) PO CAPS
50000.0000 [IU] | ORAL_CAPSULE | ORAL | 1 refills | Status: DC
Start: 2017-12-10 — End: 2018-01-15

## 2017-12-12 ENCOUNTER — Inpatient Hospital Stay: Payer: Managed Care, Other (non HMO) | Admitting: Family Medicine

## 2017-12-29 ENCOUNTER — Telehealth: Payer: Self-pay | Admitting: *Deleted

## 2017-12-29 ENCOUNTER — Ambulatory Visit (HOSPITAL_COMMUNITY)
Admission: RE | Admit: 2017-12-29 | Discharge: 2017-12-29 | Disposition: A | Discharge status: Home / Self Care | Payer: Managed Care, Other (non HMO) | Source: Ambulatory Visit | Attending: Cardiology | Admitting: Cardiology

## 2017-12-29 ENCOUNTER — Encounter (HOSPITAL_COMMUNITY): Payer: Self-pay

## 2017-12-29 VITALS — BP 146/92 | HR 92 | Wt >= 6400 oz

## 2017-12-29 DIAGNOSIS — I429 Cardiomyopathy, unspecified: Secondary | ICD-10-CM | POA: Insufficient documentation

## 2017-12-29 DIAGNOSIS — R9431 Abnormal electrocardiogram [ECG] [EKG]: Secondary | ICD-10-CM | POA: Insufficient documentation

## 2017-12-29 DIAGNOSIS — G4733 Obstructive sleep apnea (adult) (pediatric): Secondary | ICD-10-CM | POA: Insufficient documentation

## 2017-12-29 DIAGNOSIS — I480 Paroxysmal atrial fibrillation: Secondary | ICD-10-CM | POA: Insufficient documentation

## 2017-12-29 DIAGNOSIS — I272 Pulmonary hypertension, unspecified: Secondary | ICD-10-CM | POA: Diagnosis not present

## 2017-12-29 DIAGNOSIS — Z7901 Long term (current) use of anticoagulants: Secondary | ICD-10-CM | POA: Insufficient documentation

## 2017-12-29 DIAGNOSIS — I130 Hypertensive heart and chronic kidney disease with heart failure and stage 1 through stage 4 chronic kidney disease, or unspecified chronic kidney disease: Secondary | ICD-10-CM | POA: Insufficient documentation

## 2017-12-29 DIAGNOSIS — I4891 Unspecified atrial fibrillation: Secondary | ICD-10-CM | POA: Diagnosis present

## 2017-12-29 DIAGNOSIS — I10 Essential (primary) hypertension: Secondary | ICD-10-CM | POA: Diagnosis not present

## 2017-12-29 DIAGNOSIS — I493 Ventricular premature depolarization: Secondary | ICD-10-CM | POA: Diagnosis not present

## 2017-12-29 DIAGNOSIS — Z8249 Family history of ischemic heart disease and other diseases of the circulatory system: Secondary | ICD-10-CM | POA: Insufficient documentation

## 2017-12-29 DIAGNOSIS — Z6841 Body Mass Index (BMI) 40.0 and over, adult: Secondary | ICD-10-CM | POA: Insufficient documentation

## 2017-12-29 DIAGNOSIS — J45909 Unspecified asthma, uncomplicated: Secondary | ICD-10-CM | POA: Diagnosis not present

## 2017-12-29 DIAGNOSIS — I2510 Atherosclerotic heart disease of native coronary artery without angina pectoris: Secondary | ICD-10-CM | POA: Diagnosis not present

## 2017-12-29 DIAGNOSIS — N183 Chronic kidney disease, stage 3 unspecified: Secondary | ICD-10-CM

## 2017-12-29 DIAGNOSIS — Z0189 Encounter for other specified special examinations: Secondary | ICD-10-CM | POA: Insufficient documentation

## 2017-12-29 DIAGNOSIS — E669 Obesity, unspecified: Secondary | ICD-10-CM | POA: Diagnosis not present

## 2017-12-29 DIAGNOSIS — Z79899 Other long term (current) drug therapy: Secondary | ICD-10-CM | POA: Diagnosis not present

## 2017-12-29 DIAGNOSIS — Z886 Allergy status to analgesic agent status: Secondary | ICD-10-CM | POA: Insufficient documentation

## 2017-12-29 DIAGNOSIS — I5022 Chronic systolic (congestive) heart failure: Secondary | ICD-10-CM | POA: Diagnosis not present

## 2017-12-29 DIAGNOSIS — R55 Syncope and collapse: Secondary | ICD-10-CM | POA: Diagnosis not present

## 2017-12-29 DIAGNOSIS — Z9119 Patient's noncompliance with other medical treatment and regimen: Secondary | ICD-10-CM | POA: Insufficient documentation

## 2017-12-29 DIAGNOSIS — I5042 Chronic combined systolic (congestive) and diastolic (congestive) heart failure: Secondary | ICD-10-CM | POA: Diagnosis present

## 2017-12-29 DIAGNOSIS — N529 Male erectile dysfunction, unspecified: Secondary | ICD-10-CM | POA: Diagnosis not present

## 2017-12-29 LAB — BASIC METABOLIC PANEL
ANION GAP: 8 (ref 5–15)
BUN: 10 mg/dL (ref 6–20)
CHLORIDE: 101 mmol/L (ref 101–111)
CO2: 32 mmol/L (ref 22–32)
Calcium: 8.9 mg/dL (ref 8.9–10.3)
Creatinine, Ser: 1.42 mg/dL — ABNORMAL HIGH (ref 0.61–1.24)
GFR calc non Af Amer: >60 mL/min (ref >60)
GLUCOSE: 101 mg/dL — AB (ref 65–99)
Potassium: 3.4 mmol/L — ABNORMAL LOW (ref 3.5–5.1)
Sodium: 141 mmol/L (ref 135–145)

## 2017-12-29 MED ORDER — DIGOXIN 250 MCG PO TABS: 0.2500 mg | ORAL_TABLET | Freq: Every day | ORAL | 3 refills | Status: DC

## 2017-12-29 MED ORDER — SPIRONOLACTONE 25 MG PO TABS: 12.5000 mg | ORAL_TABLET | Freq: Every day | ORAL | 3 refills | Status: DC

## 2017-12-29 MED ORDER — TORSEMIDE 20 MG PO TABS: 20.0000 mg | ORAL_TABLET | ORAL | 3 refills | Status: DC

## 2017-12-29 MED FILL — CARVEDILOL 3.125 MG TABLET: 3.125 | 34 days supply | Qty: 68 | Fill #0

## 2017-12-29 MED FILL — AMIODARONE HCL 200 MG TAB: 200 | 34 days supply | Qty: 68 | Fill #0

## 2017-12-29 MED FILL — SPIRONOLACTONE 25 MG TABLET: 25 | 34 days supply | Qty: 17 | Fill #0

## 2017-12-29 MED FILL — ISOSORBIDE MN ER 60 MG TAB: 60 | 34 days supply | Qty: 34 | Fill #0

## 2017-12-29 MED FILL — LOSARTAN POTASSIUM 25 MG TA: 25 | 34 days supply | Qty: 34 | Fill #0

## 2017-12-29 MED FILL — DIGOXIN 0.125 MG TABLET: 125 | 34 days supply | Qty: 34 | Fill #0

## 2017-12-29 MED FILL — TORSEMIDE 20 MG TABLET: 20 | 33 days supply | Qty: 100 | Fill #0

## 2017-12-29 MED FILL — ATORVASTATIN 40 MG TABLET: 40 | 34 days supply | Qty: 34 | Fill #0

## 2017-12-29 NOTE — Progress Notes (Signed)
Advanced [facility] Failure Clinic Note    Primary Care: No PCP  Primary Cardiologist: Dr. Gala Romney   HPI: Mark Baker is a 37 y.o. male with a past medical history of NICM, chronic combined systolic and diastolic CHF (EF 16-10%), OSA, obesity, atrial fibrillation, and medical noncompliance.   Admitted 10/31/16-11/14/16 with atrial fibrillation RVR and low output (co ox 38%). Started on Amiodarone and milrinone. TEE on 11/07/16 - EF 20% RV severely HK Massive LA (7.7cm), Severely dilated RA, Normal AV, Severe central MR, Moderate TR (RVSP 65-70), Trivial PR.  He spontaneously converted to NSR. He diuresed 44 pounds, discharge weight was 477 pounds. Discharged on torsemide 40mg  daily. Renal function inhibited starting ARB/ARNI.   Admitted 01/01/17-01/02/17 with syncope, event was triggered by an argument at home. Outpatient monitor was suggested. He remained in NSR on telemetry overnight.   Admitted 4/16-4/17/19 with CP and syncope. EKG unchanged and troponins were flat. Orthostatics negative. Weight was up 30 lbs and he was diuresed with IV lasix, then transitioned to torsemide 40 mg am, 20 mg pm. Losartan was stopped and he was started on Entresto. DC weight: 499 lbs.   He presents today for post hospital follow up. Overall, doing okay. He did not start Entresto due to previous cough, but has been taking losartan. He is SOB with stairs and walking long distances. Denies orthopnea. He has BLE edema. Denies CP, palpitations, or dizziness. No further syncope. He is wearing his CPAP 4x/week for about 6 hours. He is not weighing at home, but has a scale. SBP 130-140s at home. Drinks around 2L/day. Limits salt. He ran out of spiro and digoxin. He is low on torsemide and has only been taking 20-40 mg daily. He is unable to afford $25 copay for refills until Thursday.   Holter Monitor 01/2017: NSR with occasional PVCs. No recurrent AF or arrhythmias.   R/LHC 03/2017:  Mid RCA lesion, 20 %stenosed.    Findings:  Ao = 118/88 (97) LV = 132/28 RA = 9 RV = 54/9 PA = 51/20 (34) PCW = 22 Fick cardiac output/index = 9.9/3.2 PVR = 1.2 WU SVR = 708 dynes Ao sat = 88% PA sat = 68%,   Assessment: 1. Minimal non-obstructive CAD 2. Mild pulmonary HTN 3. NICM with EF ~25-30% though hard to assess EF on LV gram due to poor opacification  Echo 11/2017: EF 20-25%, moderate LVH, diffuse HK, akinesis of the inferolateral, inferior, and inferoseptal myocardium, mild MR, severe dilation of RA and LA  Review of systems complete and found to be negative unless listed in HPI.   Past Medical History:  Diagnosis Date  . Childhood asthma    no exacerbation since age 47   . Chronic combined systolic and diastolic CHF (congestive heart failure) (HCC)    a) ECHO (11/2012): EF 40-45%, RV fx difficult to see, nl size b) ECHO (05/2014): EF 20-25%, grade 2 DD, RV mildly dilated and sys fx mod reduced  . Hypertension   . Migraine    "down to maybe 2/year now; used to have them q other week" (11/26/2017)  . NICM (nonischemic cardiomyopathy) (HCC)    a) (11/2012): normal coronaries  . OSA on CPAP     Current Outpatient Medications  Medication Sig Dispense Refill  . amiodarone (PACERONE) 200 MG tablet Take 1 tablet (200 mg total) by mouth 2 (two) times daily. 180 tablet 3  . atorvastatin (LIPITOR) 40 MG tablet Take 1 tablet (40 mg total) by mouth daily. 90 tablet 3  .  carvedilol (COREG) 3.125 MG tablet Take 1 tablet (3.125 mg total) by mouth 2 (two) times daily. 180 tablet 3  . digoxin (LANOXIN) 0.25 MG tablet Take 1 tablet (0.25 mg total) by mouth daily. 90 tablet 3  . hydrALAZINE (APRESOLINE) 50 MG tablet Take 1 tablet (50 mg total) by mouth every 8 (eight) hours. 90 tablet 6  . isosorbide mononitrate (IMDUR) 60 MG 24 hr tablet Take 1 tablet (60 mg total) by mouth daily. 90 tablet 3  . losartan (COZAAR) 25 MG tablet Take 25 mg by mouth daily.    Marland Kitchen spironolactone (ALDACTONE) 25 MG tablet Take 1 tablet  (25 mg total) by mouth daily. 90 tablet 3  . torsemide (DEMADEX) 20 MG tablet Take 1-2 tablets (20-40 mg total) by mouth See admin instructions. Take 40 mg (2 Tablets) in the AM and 20 mg (1 Tablet) in the PM 90 tablet 3  . warfarin (COUMADIN) 7.5 MG tablet Take 1 tablet (7.5 mg total) by mouth daily at 6 PM. 45 tablet 6  . sildenafil (VIAGRA) 100 MG tablet Take 1 tablet (100 mg total) by mouth daily as needed for erectile dysfunction. 5 tablet 3  . Vitamin D, Ergocalciferol, (DRISDOL) 50000 units CAPS capsule Take 1 capsule (50,000 Units total) by mouth every 7 (seven) days. 12 capsule 1   No current facility-administered medications for this encounter.     Allergies  Allergen Reactions  . Entresto [Sacubitril-Valsartan] Cough    Was on Entresto 07/2016-09/2016  . Aspirin Hives, Itching and Rash      Social History   Socioeconomic History  . Marital status: Married    Spouse name: Not on file  . Number of children: Not on file  . Years of education: Not on file  . Highest education level: Not on file  Occupational History  . Not on file  Social Needs  . Financial resource strain: Not on file  . Food insecurity:    Worry: Not on file    Inability: Not on file  . Transportation needs:    Medical: Not on file    Non-medical: Not on file  Tobacco Use  . Smoking status: Never Smoker  . Smokeless tobacco: Never Used  Substance and Sexual Activity  . Alcohol use: Yes    Alcohol/week: 0.0 oz    Comment: 11/26/2017 "nothing since 07/11/2017"  . Drug use: Never  . 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   Lifestyle  . Physical activity:    Days per week: Not on file    Minutes per session: Not on file  . Stress: Not on file  Relationships  . Social connections:    Talks on phone: Not on file    Gets together: Not on file    Attends religious service: Not on file    Active member of club or organization: Not on file    Attends meetings of  clubs or organizations: Not on file    Relationship status: Not on file  . Intimate partner violence:    Fear of current or ex partner: Not on file    Emotionally abused: Not on file    Physically abused: Not on file    Forced sexual activity: Not on file  Other Topics Concern  . Not on file  Social History Narrative   Lives in [place] with wife of 4 years.  Works for [employer] at [employer].  1 son and 2 daughters.  Denies cigarettes. Drank 1 beer last night 11/22/12. Denies drugs             Family History  Problem Relation Age of Onset  . Hypertension Father   . Heart failure Father        paternal side of family   . Hypertension Sister   . Stroke Maternal Grandmother   . Hypertension Unknown        maternal side of family   . Diabetes Neg Hx   . Hyperlipidemia Neg Hx     Vitals:   12/29/17 1356  BP: (!) 146/92  Pulse: 92  SpO2: 99%  Weight: (!) 502 lb (227.7 kg)   Wt Readings from Last 3 Encounters:  12/29/17 (!) 502 lb (227.7 kg)  12/08/17 (!) 497 lb 9.6 oz (225.7 kg)  11/26/17 (!) 499 lb 8 oz (226.6 kg)    PHYSICAL EXAM: General: Obese. No resp difficulty. HEENT: Normal Neck: Supple. JVP difficult to assess due to body habitus. Carotids 2+ bilat; no bruits. No thyromegaly or nodule noted. Cor: PMI nondisplaced. RRR, No M/G/R noted. Distant heart sounds Lungs: CTAB, normal effort. Diminished throughout.  Abdomen: obese, soft, non-tender, non-distended, no HSM. No bruits or masses. +BS  Extremities: No cyanosis, clubbing, or rash. R and LLE 1-2+ edema Neuro: Alert & orientedx3, cranial nerves grossly intact. moves all 4 extremities w/o difficulty. Affect pleasant  EKG: NSR 92 bpm, QRS 122 ms  ASSESSMENT & PLAN: 1. Chronic combined systolic and diastolic CHF: Echo 03/2017 EF 30-35%, RV not well visualized.  10/2016 EF 20-25% NICM, likely tachymediated although noncompliance may play a role. Echo 11/2017: EF 20-25%, moderate LVH, diffuse HK, mild MR, severe dilation  of RA and LA - NYHA III. Volume status elevated on exam in the setting of medication noncompliance.   - Restart torsemide 40 mg in the am, 20 mg in the pm. BMET today.  - Restart digoxin 0.25 mcg daily. Dig level 0.8 11/2017 - Continue hydralazine 50 mg TID.  - Continue imdur 60 mg daily. (Will not increase with viagra use, knows to avoid on days he takes) - Restart spiro 12.5 mg daily - Continue Coreg 3.125mg  BID, patient refuses increase in Coreg due to erectile dysfunction. - Continue losartan to 25 mg daily.  Refuses Entresto - Refer to EP for ICD consideration (not a CRT-D candidate). Pt is agreeable.  - Refer back to HF paramedicine program   2. Obesity: - Body mass index is 62.75 kg/m. - Encouraged him to continue to exercise and increase activity.  - Encouraged smaller portion size.  - Encouraged him to follow up in Bariatric Clinic (has seen them before)  3. OSA - Continue CPAP nightly. Encouraged nightly use.   4. CKD stage III - BMET today - Baseline 1.3-1.5.   5. PAF - EKG today shows NSR. - Continue amiodarone 200 mg BID. TFT and LFT normal 11/2017 - This patients CHA2DS2-VASc Score and unadjusted Ischemic Stroke Rate (% per year) is equal to 2.2 % stroke rate/year from a score of 2 Above score calculated as 1 point each if present [CHF, HTN, DM, Vascular=MI/PAD/Aortic Plaque, Age if 65-74, or Male], 2 points each if present [Age > 75, or Stroke/TIA/TE] - Continue coumadin for anticoagulation.  - Refer back to INR clinic on [address].   6. Erectile dysfunction - Continue Viagra as needed. Pt aware, re-educated, and repeated back potential for interaction with imdur.   7. Syncope - Telemetry monitor 01/2017: showed NSR with occasional  PVCs. No AF or other arrhythmias - [facility] 03/2017: with mild non obstructive CAD, mild pulmonary HTN, EF ~25-30% - No more syncope since discharge. Unclear etiology.   8. HTN - Elevated today. Restart medications as above.   Meds  provided through HF fund. He has no prescription coverage through his insurance. He is to pick up at outpatient pharmacy today.   Restart torsemide 40 mg am, 20 mg pm Restart spiro 12.5 mg daily Restart digoxin 0.125 mg daily BMET today and in 2 weeks Follow up in 2 weeks  Refer to EP for ICD Refer back to Coumadin Clinic on [facility] Refer back to HF paramedicine.   Alford Highland, NP 12/29/17   Greater than 50% of the 25 minute visit was spent in counseling/coordination of care regarding disease state education, salt/fluid restriction, sliding scale diuretics, and medication compliance.

## 2017-12-29 NOTE — Telephone Encounter (Signed)
Spoke with Megan at [facility] and she states they want him to be followed at our coumadin clinic as he is on Coumadin . Spoke with pt and he states he has been taking his coumadin but has not had INR checked Pt states he is taking coumadin 7.5mg  daily last INR check was 12/08/2017 and was 2.5  checked by Dr Clelia Croft on office visit. Made an appt for him to be seen in coumadin clinic on Thursday May 23rd and stressed to him importance of keeping this appt and he states understanding

## 2017-12-29 NOTE — Patient Instructions (Signed)
RESTART Torsemide 40 mg (2 tabs) in the AM and 20 mg (1 tab) in the PM RESTART Digoxin 0.125 mg, one tab daily RESTART Spironolactone 12.5 mg, one half tab daily  Labs today We will only contact you if something comes back abnormal or we need to make some changes. Otherwise no news is good news!  You have been referred to [facility]- for ICD consideration and INR management  Address: [address], [place], [place] [address] Phone: [phone]    Your physician recommends that you schedule a follow-up appointment in: 2 weeks with Fausto Skillern

## 2017-12-30 ENCOUNTER — Telehealth (HOSPITAL_COMMUNITY): Payer: Self-pay | Admitting: *Deleted

## 2017-12-30 MED ORDER — POTASSIUM CHLORIDE ER 20 MEQ PO TBCR: 20.0000 meq | EXTENDED_RELEASE_TABLET | Freq: Every day | ORAL | 3 refills | Status: DC

## 2017-12-30 NOTE — Telephone Encounter (Signed)
Result Notes for Basic metabolic panel   Notes recorded by Georgina Peer, RN on 12/30/2017 at 2:18 PM EDT Patient called back and he is agreeable with plan. He is only able to come on Monday's due to his work schedule. Our office is closed this coming Monday for [facility]. Patient is already scheduled to come in on Monday June 3rd, we'll plan to check bmet at that time. Medication sent to preferred pharmacy. ------  Notes recorded by Theresia Bough, CMA on 12/30/2017 at 2:13 PM EDT Left message for patient to call back. [phone] Judie Petit)  ------  Notes recorded by Alford Highland, NP on 12/30/2017 at 7:43 AM EDT Have him start 20 meq potassium daily. He will need repeat BMET next week.

## 2018-01-06 ENCOUNTER — Other Ambulatory Visit (HOSPITAL_COMMUNITY): Payer: Self-pay

## 2018-01-06 DIAGNOSIS — M25579 Pain in unspecified ankle and joints of unspecified foot: Secondary | ICD-10-CM

## 2018-01-07 ENCOUNTER — Encounter (HOSPITAL_COMMUNITY): Payer: Self-pay

## 2018-01-07 NOTE — Progress Notes (Signed)
[employer] FMLA paperwork completed for patient to have 1-5 days per month off intermittently for flare ups and/or appointments per patient request and Dr. Alford Highland recommendation. Faxed to provided # [phone] Copy of forms scanned into patient's electronic medical record.  Ave Filter, RN

## 2018-01-12 ENCOUNTER — Inpatient Hospital Stay (HOSPITAL_COMMUNITY)
Admission: RE | Admit: 2018-01-12 | Discharge: 2018-01-12 | Disposition: A | Discharge status: Home / Self Care | Payer: Managed Care, Other (non HMO) | Source: Ambulatory Visit

## 2018-01-12 NOTE — Progress Notes (Signed)
Advanced Heart Failure Clinic Note    Primary Care: No PCP  Primary Cardiologist: Dr. Gala Romney   HPI: Mark Baker is a 37 y.o. male with a past medical history of NICM, chronic combined systolic and diastolic CHF (EF 84-69%), OSA, obesity, atrial fibrillation, and medical noncompliance.   Admitted 10/31/16-11/14/16 with atrial fibrillation RVR and low output (co ox 38%). Started on Amiodarone and milrinone. TEE on 11/07/16 - EF 20% RV severely HK Massive LA (7.7cm), Severely dilated RA, Normal AV, Severe central MR, Moderate TR (RVSP 65-70), Trivial PR.  He spontaneously converted to NSR. He diuresed 44 pounds, discharge weight was 477 pounds. Discharged on torsemide 40mg  daily. Renal function inhibited starting ARB/ARNI.   Admitted 01/01/17-01/02/17 with syncope, event was triggered by an argument at home. Outpatient monitor was suggested. He remained in NSR on telemetry overnight.   Admitted 4/16-4/17/19 with CP and syncope. EKG unchanged and troponins were flat. Orthostatics negative. Weight was up 30 lbs and he was diuresed with IV lasix, then transitioned to torsemide 40 mg am, 20 mg pm. Losartan was stopped and he was started on Entresto. DC weight: 499 lbs.   Today he returns for hf follow up. Last visit torsemide, digoxin, and spiro restarted.    Holter Monitor 01/2017: NSR with occasional PVCs. No recurrent AF or arrhythmias.   R/LHC 03/2017:  Mid RCA lesion, 20 %stenosed.   Findings:  Ao = 118/88 (97) LV = 132/28 RA = 9 RV = 54/9 PA = 51/20 (34) PCW = 22 Fick cardiac output/index = 9.9/3.2 PVR = 1.2 WU SVR = 708 dynes Ao sat = 88% PA sat = 68%,   Assessment: 1. Minimal non-obstructive CAD 2. Mild pulmonary HTN 3. NICM with EF ~25-30% though hard to assess EF on LV gram due to poor opacification  Echo 11/2017: EF 20-25%, moderate LVH, diffuse HK, akinesis of the inferolateral, inferior, and inferoseptal myocardium, mild MR, severe dilation of RA and LA  Review of  systems complete and found to be negative unless listed in HPI.   Past Medical History:  Diagnosis Date  . Childhood asthma    no exacerbation since age 11   . Chronic combined systolic and diastolic CHF (congestive heart failure) (HCC)    a) ECHO (11/2012): EF 40-45%, RV fx difficult to see, nl size b) ECHO (05/2014): EF 20-25%, grade 2 DD, RV mildly dilated and sys fx mod reduced  . Hypertension   . Migraine    "down to maybe 2/year now; used to have them q other week" (11/26/2017)  . NICM (nonischemic cardiomyopathy) (HCC)    a) (11/2012): normal coronaries  . OSA on CPAP     Current Outpatient Medications  Medication Sig Dispense Refill  . amiodarone (PACERONE) 200 MG tablet Take 1 tablet (200 mg total) by mouth 2 (two) times daily. 180 tablet 3  . atorvastatin (LIPITOR) 40 MG tablet Take 1 tablet (40 mg total) by mouth daily. 90 tablet 3  . carvedilol (COREG) 3.125 MG tablet Take 1 tablet (3.125 mg total) by mouth 2 (two) times daily. 180 tablet 3  . digoxin (LANOXIN) 0.25 MG tablet Take 1 tablet (0.25 mg total) by mouth daily. 30 tablet 3  . hydrALAZINE (APRESOLINE) 50 MG tablet Take 1 tablet (50 mg total) by mouth every 8 (eight) hours. 90 tablet 6  . isosorbide mononitrate (IMDUR) 60 MG 24 hr tablet Take 1 tablet (60 mg total) by mouth daily. 90 tablet 3  . losartan (COZAAR) 25 MG tablet Take  25 mg by mouth daily.    . potassium chloride 20 MEQ TBCR Take 20 mEq by mouth daily. 90 tablet 3  . sildenafil (VIAGRA) 100 MG tablet Take 1 tablet (100 mg total) by mouth daily as needed for erectile dysfunction. 5 tablet 3  . spironolactone (ALDACTONE) 25 MG tablet Take 0.5 tablets (12.5 mg total) by mouth daily. 15 tablet 3  . torsemide (DEMADEX) 20 MG tablet Take 1-2 tablets (20-40 mg total) by mouth See admin instructions. Take 40 mg (2 Tablets) in the AM and 20 mg (1 Tablet) in the PM 90 tablet 3  . Vitamin D, Ergocalciferol, (DRISDOL) 50000 units CAPS capsule Take 1 capsule (50,000  Units total) by mouth every 7 (seven) days. 12 capsule 1  . warfarin (COUMADIN) 7.5 MG tablet Take 1 tablet (7.5 mg total) by mouth daily at 6 PM. 45 tablet 6   No current facility-administered medications for this encounter.     Allergies  Allergen Reactions  . Entresto [Sacubitril-Valsartan] Cough    Was on Entresto 07/2016-09/2016  . Aspirin Hives, Itching and Rash      Social History   Socioeconomic History  . Marital status: Married    Spouse name: Not on file  . Number of children: Not on file  . Years of education: Not on file  . Highest education level: Not on file  Occupational History  . Not on file  Social Needs  . Financial resource strain: Not on file  . Food insecurity:    Worry: Not on file    Inability: Not on file  . Transportation needs:    Medical: Not on file    Non-medical: Not on file  Tobacco Use  . Smoking status: Never Smoker  . Smokeless tobacco: Never Used  Substance and Sexual Activity  . Alcohol use: Yes    Alcohol/week: 0.0 oz    Comment: 11/26/2017 "nothing since 07/11/2017"  . Drug use: Never  . 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   Lifestyle  . Physical activity:    Days per week: Not on file    Minutes per session: Not on file  . Stress: Not on file  Relationships  . Social connections:    Talks on phone: Not on file    Gets together: Not on file    Attends religious service: Not on file    Active member of club or organization: Not on file    Attends meetings of clubs or organizations: Not on file    Relationship status: Not on file  . Intimate partner violence:    Fear of current or ex partner: Not on file    Emotionally abused: Not on file    Physically abused: Not on file    Forced sexual activity: Not on file  Other Topics Concern  . Not on file  Social History Narrative   Lives in [place] with wife of 4 years.  Works for [employer] at [employer].  1 son and 2 daughters.     Denies  cigarettes. Drank 1 beer last night 11/22/12. Denies drugs             Family History  Problem Relation Age of Onset  . Hypertension Father   . Heart failure Father        paternal side of family   . Hypertension Sister   . Stroke Maternal Grandmother   . Hypertension Unknown  maternal side of family   . Diabetes Neg Hx   . Hyperlipidemia Neg Hx     There were no vitals filed for this visit. Wt Readings from Last 3 Encounters:  12/29/17 (!) 502 lb (227.7 kg)  12/08/17 (!) 497 lb 9.6 oz (225.7 kg)  11/26/17 (!) 499 lb 8 oz (226.6 kg)    PHYSICAL EXAM: General: Obese. No resp difficulty. HEENT: Normal Neck: Supple. JVP difficult to assess due to body habitus. Carotids 2+ bilat; no bruits. No thyromegaly or nodule noted. Cor: PMI nondisplaced. RRR, No M/G/R noted. Distant heart sounds Lungs: CTAB, normal effort. Diminished throughout.  Abdomen: obese, soft, non-tender, non-distended, no HSM. No bruits or masses. +BS  Extremities: No cyanosis, clubbing, or rash. R and LLE 1-2+ edema Neuro: Alert & orientedx3, cranial nerves grossly intact. moves all 4 extremities w/o difficulty. Affect pleasant  EKG: NSR 92 bpm, QRS 122 ms  ASSESSMENT & PLAN: 1. Chronic combined systolic and diastolic CHF: Echo 03/2017 EF 30-35%, RV not well visualized.  10/2016 EF 20-25% NICM, likely tachymediated although noncompliance may play a role. Echo 11/2017: EF 20-25%, moderate LVH, diffuse HK, mild MR, severe dilation of RA and LA - NYHA III. Volume status elevated on exam in the setting of medication noncompliance.   - Restart torsemide 40 mg in the am, 20 mg in the pm. BMET today.  - Restart digoxin 0.25 mcg daily. Dig level 0.8 11/2017 - Continue hydralazine 50 mg TID.  - Continue imdur 60 mg daily. (Will not increase with viagra use, knows to avoid on days he takes) - Restart spiro 12.5 mg daily - Continue Coreg 3.125mg  BID, patient refuses increase in Coreg due to erectile dysfunction. -  Continue losartan to 25 mg daily.  Refuses Entresto - Refer to EP for ICD consideration (not a CRT-D candidate). Pt is agreeable.  - Refer back to HF paramedicine program   2. Obesity: - There is no height or weight on file to calculate BMI. - Encouraged him to continue to exercise and increase activity.  - Encouraged smaller portion size.  - Encouraged him to follow up in Bariatric Clinic (has seen them before)  3. OSA - Continue CPAP nightly. Encouraged nightly use.   4. CKD stage III - BMET today - Baseline 1.3-1.5.   5. PAF - EKG today shows NSR. - Continue amiodarone 200 mg BID. TFT and LFT normal 11/2017 - This patients CHA2DS2-VASc Score and unadjusted Ischemic Stroke Rate (% per year) is equal to 2.2 % stroke rate/year from a score of 2 Above score calculated as 1 point each if present [CHF, HTN, DM, Vascular=MI/PAD/Aortic Plaque, Age if 65-74, or Male], 2 points each if present [Age > 75, or Stroke/TIA/TE] - Continue coumadin for anticoagulation.  - Refer back to INR clinic on [address].   6. Erectile dysfunction - Continue Viagra as needed. Pt aware, re-educated, and repeated back potential for interaction with imdur.   7. Syncope - Telemetry monitor 01/2017: showed NSR with occasional PVCs. No AF or other arrhythmias - [facility] 03/2017: with mild non obstructive CAD, mild pulmonary HTN, EF ~25-30% - No more syncope since discharge. Unclear etiology.   8. HTN - Elevated today. Restart medications as above.    Tonye Becket, NP 01/12/18

## 2018-01-13 ENCOUNTER — Telehealth (HOSPITAL_COMMUNITY): Payer: Self-pay | Admitting: *Deleted

## 2018-01-13 NOTE — Telephone Encounter (Signed)
Completed PA for pt's Sildenafil 100 mg, med approved, spoke w/pt he states he holds his Imdur when he takes the Sildenafil and will continue to do so.

## 2018-01-15 ENCOUNTER — Ambulatory Visit (INDEPENDENT_AMBULATORY_CARE_PROVIDER_SITE_OTHER): Payer: Managed Care, Other (non HMO) | Admitting: Cardiology

## 2018-01-15 ENCOUNTER — Encounter: Payer: Self-pay | Admitting: *Deleted

## 2018-01-15 ENCOUNTER — Encounter: Payer: Self-pay | Admitting: Cardiology

## 2018-01-15 VITALS — BP 128/82 | HR 94 | Ht 75.0 in | Wt >= 6400 oz

## 2018-01-15 DIAGNOSIS — I428 Other cardiomyopathies: Secondary | ICD-10-CM | POA: Diagnosis not present

## 2018-01-15 DIAGNOSIS — I480 Paroxysmal atrial fibrillation: Secondary | ICD-10-CM | POA: Diagnosis not present

## 2018-01-15 DIAGNOSIS — G4733 Obstructive sleep apnea (adult) (pediatric): Secondary | ICD-10-CM | POA: Diagnosis not present

## 2018-01-15 NOTE — Progress Notes (Signed)
Electrophysiology Office Note   Date:  01/15/2018   ID:  Mark Baker, DOB May 05, 1981, MRN [id number]  PCP:  Sherren Mocha, MD  Cardiologist:  Bensimhon Primary Electrophysiologist:  Kaydee Magel Jorja Loa, MD    No chief complaint on file.    History of Present Illness: Mark Baker is a 37 y.o. male who is being seen today for the evaluation of NICM at the request of Mark Baker. Presenting today for electrophysiology evaluation.  He has a history of nonischemic cardiomyopathy, with an EF of 20 to 25%, OSA, obesity, atrial fibrillation, and medical noncompliance.  He was admitted 10/31/2016 to 11/14/2016 with atrial fibrillation and low cardiac output.  He was started on amiodarone and milrinone.  His EF was found to be significantly decreased at that time.  He spontaneously converted to sinus rhythm and diuresed 44 pounds.  Discharge weight was 477 pounds.  He was admitted 12/02/2016 with syncope.  This was triggered by an argument at home.  Outpatient monitoring was suggested.  He remained in sinus rhythm on telemetry overnight.  His had multiple admissions for syncope and heart failure.   Today, he denies symptoms of palpitations, chest pain, shortness of breath, orthopnea, PND, lower extremity edema, claudication, dizziness, presyncope, syncope, bleeding, or neurologic sequela. The patient is tolerating medications without difficulties.    Past Medical History:  Diagnosis Date  . Childhood asthma    no exacerbation since age 74   . Chronic combined systolic and diastolic CHF (congestive heart failure) (HCC)    a) ECHO (11/2012): EF 40-45%, RV fx difficult to see, nl size b) ECHO (05/2014): EF 20-25%, grade 2 DD, RV mildly dilated and sys fx mod reduced  . Hypertension   . Migraine    "down to maybe 2/year now; used to have them q other week" (11/26/2017)  . NICM (nonischemic cardiomyopathy) (HCC)    a) (11/2012): normal coronaries  . OSA on CPAP    Past Surgical History:  Procedure  Laterality Date  . CARDIOVERSION N/A 11/07/2016   Procedure: CARDIOVERSION;  Surgeon: Dolores Patty, MD;  Location: [facility] OR;  Service: Cardiovascular;  Laterality: N/A;  Qamar Rosman need extra help positioning patient  . KNEE ARTHROSCOPY Right 2003   ANTERIOR CRUCIATE LIGAMENT REPAIR   . LEFT HEART CATHETERIZATION WITH CORONARY ANGIOGRAM N/A 11/24/2012   Procedure: LEFT HEART CATHETERIZATION WITH CORONARY ANGIOGRAM;  Surgeon: Peter M Swaziland, MD;  Location: [facility] CATH LAB;  Service: Cardiovascular;  Laterality: N/A;  . RIGHT/LEFT HEART CATH AND CORONARY ANGIOGRAPHY N/A 03/18/2017   Procedure: Right/Left Heart Cath and Coronary Angiography;  Surgeon: Dolores Patty, MD;  Location: [facility] INVASIVE CV LAB;  Service: Cardiovascular;  Laterality: N/A;  . TEE WITHOUT CARDIOVERSION N/A 11/07/2016   Procedure: TRANSESOPHAGEAL ECHOCARDIOGRAM (TEE);  Surgeon: Dolores Patty, MD;  Location: [facility] OR;  Service: Cardiovascular;  Laterality: N/A;     Current Outpatient Medications  Medication Sig Dispense Refill  . amiodarone (PACERONE) 200 MG tablet Take 1 tablet (200 mg total) by mouth 2 (two) times daily. 180 tablet 3  . atorvastatin (LIPITOR) 40 MG tablet Take 1 tablet (40 mg total) by mouth daily. 90 tablet 3  . carvedilol (COREG) 3.125 MG tablet Take 1 tablet (3.125 mg total) by mouth 2 (two) times daily. 180 tablet 3  . digoxin (LANOXIN) 0.25 MG tablet Take 1 tablet (0.25 mg total) by mouth daily. 30 tablet 3  . hydrALAZINE (APRESOLINE) 50 MG tablet Take 1 tablet (50 mg total) by mouth every  8 (eight) hours. 90 tablet 6  . isosorbide mononitrate (IMDUR) 60 MG 24 hr tablet Take 1 tablet (60 mg total) by mouth daily. 90 tablet 3  . losartan (COZAAR) 25 MG tablet Take 25 mg by mouth daily.    . potassium chloride 20 MEQ TBCR Take 20 mEq by mouth daily. 90 tablet 3  . sildenafil (VIAGRA) 100 MG tablet Take 1 tablet (100 mg total) by mouth daily as needed for erectile dysfunction. 5 tablet 3  . spironolactone  (ALDACTONE) 25 MG tablet Take 0.5 tablets (12.5 mg total) by mouth daily. 15 tablet 3  . torsemide (DEMADEX) 20 MG tablet Take 1-2 tablets (20-40 mg total) by mouth See admin instructions. Take 40 mg (2 Tablets) in the AM and 20 mg (1 Tablet) in the PM 90 tablet 3  . warfarin (COUMADIN) 7.5 MG tablet Take 1 tablet (7.5 mg total) by mouth daily at 6 PM. 45 tablet 6   No current facility-administered medications for this visit.     Allergies:   Entresto [sacubitril-valsartan] and Aspirin   Social History:  The patient  reports that he has never smoked. He has never used smokeless tobacco. He reports that he drinks alcohol. He reports that he does not use drugs.   Family History:  The patient's family history includes Heart failure in his father; Hypertension in his father, sister, and unknown relative; Stroke in his maternal grandmother.    ROS:  Please see the history of present illness.   Otherwise, review of systems is positive for weight change, chills, chest pain, snoring, knee pain.   All other systems are reviewed and negative.    PHYSICAL EXAM: VS:  BP 128/82   Pulse 94   Ht 6\' 3"  (1.905 m)   Wt (!) 506 lb 3.2 oz (229.6 kg)   SpO2 91%   BMI 63.27 kg/m  , BMI Body mass index is 63.27 kg/m. GEN: Well nourished, well developed, in no acute distress  HEENT: normal  Neck: no JVD, carotid bruits, or masses Cardiac: RRR; no murmurs, rubs, or gallops,no edema  Respiratory:  clear to auscultation bilaterally, normal work of breathing GI: soft, nontender, nondistended, + BS MS: no deformity or atrophy  Skin: warm and dry Neuro:  Strength and sensation are intact Psych: euthymic mood, full affect  EKG:  EKG is not ordered today. Personal review of the ekg ordered 12/29/17 shows sinus rhythm, LVH with QRS widening, right atrial enlargement, rate 92  Recent Labs: 11/25/2017: Hemoglobin 15.3; Platelets 203 11/26/2017: TSH 0.505 12/08/2017: ALT 21; BNP 44.7 12/29/2017: BUN 10;  Creatinine, Ser 1.42; Potassium 3.4; Sodium 141    Lipid Panel     Component Value Date/Time   CHOL 155 12/16/2016 0958   TRIG 121 12/16/2016 0958   HDL 43 12/16/2016 0958   CHOLHDL 3.6 12/16/2016 0958   CHOLHDL 6.2 07/06/2016 0347   VLDL 46 (H) 07/06/2016 0347   LDLCALC 88 12/16/2016 0958     Wt Readings from Last 3 Encounters:  01/15/18 (!) 506 lb 3.2 oz (229.6 kg)  12/29/17 (!) 502 lb (227.7 kg)  12/08/17 (!) 497 lb 9.6 oz (225.7 kg)      Other studies Reviewed: Additional studies/ records that were reviewed today include: TTE 11/26/17  Review of the above records today demonstrates:  - Left ventricle: The cavity size was severely dilated. Wall   thickness was increased in a pattern of moderate LVH. Systolic   function was severely reduced. The estimated ejection fraction  was in the range of 20% to 25%. Diffuse hypokinesis. There is   akinesis of the inferolateral, inferior, and inferoseptal   myocardium. - Mitral valve: There was mild regurgitation. - Left atrium: The atrium was severely dilated. - Right atrium: The atrium was severely dilated.   ASSESSMENT AND PLAN:  1.  Chronic combined systolic and diastolic heart failure: Echo with an EF of 20 to 25%.  He is on optimal medical therapy with hydralazine, indoor, Coreg, and Aldactone.  He refuses to increase his Coreg dose due to erectile dysfunction.  He is being referred for ICD consideration.  Unfortunately, his weight is a complicating factor.  He weighs 506 pounds today.  The weight limit for our table in the EP lab is at 500 pounds.  He would thus need to lose a significant amount of weight prior to consideration for ICD implantation.  He also has a high risk of complications due to his weight from the procedure.  2.  Morbid obesity: Has been encouraged to follow-up with bariatric surgery.  3.  OSA: Continue CPAP  4.  CKD stage III: Per heart failure cardiology  5.  Paroxysmal atrial fibrillation: Currently  on amiodarone.  Farris Blash decrease to once daily.  Continue Coumadin.  This patients CHA2DS2-VASc Score and unadjusted Ischemic Stroke Rate (% per year) is equal to 2.2 % stroke rate/year from a score of 2  Above score calculated as 1 point each if present [CHF, HTN, DM, Vascular=MI/PAD/Aortic Plaque, Age if 65-74, or Male] Above score calculated as 2 points each if present [Age > 75, or Stroke/TIA/TE]  Current medicines are reviewed at length with the patient today.   The patient does not have concerns regarding his medicines.  The following changes were made today:  none  Labs/ tests ordered today include:  No orders of the defined types were placed in this encounter.  Case discussed with primary cardiology  Disposition:   FU with Blanka Rockholt PRN  Signed, Krystle Polcyn Jorja Loa, MD  01/15/2018 3:58 PM     [facility] [address] [place] [address] [phone] (office) [phone] (fax)

## 2018-01-15 NOTE — Patient Instructions (Signed)
Medication Instructions:  Your physician recommends that you continue on your current medications as directed. Please refer to the Current Medication list given to you today.  Labwork: None ordered     *We will only notify you of abnormal results, otherwise continue current treatment plan.  Testing/Procedures: None ordered  Follow-Up: No follow up is needed at this time with Dr. Elberta Fortis.  He will see you on an as needed basis.   * If you need a refill on your cardiac medications before your next appointment, please call your pharmacy.   *Please note that any paperwork needing to be filled out by the provider will need to be addressed at the front desk prior to seeing the provider. Please note that any FMLA, disability or other documents regarding health condition is subject to a $25.00 charge that must be received prior to completion of paperwork in the form of a money order or check.  Thank you for choosing [facility]!!   Dory Horn, RN [phone]      \

## 2018-01-21 ENCOUNTER — Encounter (HOSPITAL_COMMUNITY): Payer: Managed Care, Other (non HMO)

## 2018-01-31 ENCOUNTER — Telehealth (HOSPITAL_COMMUNITY): Payer: Self-pay

## 2018-01-31 NOTE — Telephone Encounter (Signed)
Pt was called to schedule initial CHP visit/no answer/ message left

## 2018-02-03 ENCOUNTER — Telehealth (HOSPITAL_COMMUNITY): Payer: Self-pay

## 2018-02-06 ENCOUNTER — Telehealth (HOSPITAL_COMMUNITY): Payer: Self-pay

## 2018-02-06 NOTE — Telephone Encounter (Signed)
Pt called to setup a chp visit for next week.  He sent a text and stated that he was at work and for me to text.  I notified him who I was and the purpose for me reaching out to him/pt stopped texting.

## 2018-02-06 NOTE — Telephone Encounter (Signed)
Pt called again and message left for a CHP visit

## 2018-02-06 NOTE — Telephone Encounter (Signed)
Called pt to setup a CHP visit/left vm and no call back noted

## 2018-02-23 ENCOUNTER — Telehealth (HOSPITAL_COMMUNITY): Payer: Self-pay

## 2018-02-23 NOTE — Telephone Encounter (Signed)
Geraldine Contras was originally going to see this pt but found out last week due to his work schedule and he is only off on mondays so I will begin to see pt.  I called him today to see if we can sch a visit for today, it rang once and then went to VM, I did leave message for him to return my call.   Kerry Hough, EMT-Paramedic  02/23/18

## 2018-02-26 ENCOUNTER — Telehealth (HOSPITAL_COMMUNITY): Payer: Self-pay

## 2018-02-26 NOTE — Telephone Encounter (Signed)
Called pt again this week to sch home visit, it sounded like someone picked up and then hung it up.  Will try again next week.   Kerry Hough, EMT-Paramedic  02/26/18

## 2018-03-03 ENCOUNTER — Telehealth (HOSPITAL_COMMUNITY): Payer: Self-pay

## 2018-03-03 NOTE — Telephone Encounter (Signed)
Contacted pt again regarding referral, pt is off on mondays however no answer yesterday. I left a message for him to return my call.   Kerry Hough, EMT-Paramedic  03/03/18

## 2018-03-09 ENCOUNTER — Telehealth (HOSPITAL_COMMUNITY): Payer: Self-pay

## 2018-03-09 NOTE — Telephone Encounter (Signed)
Attempted to call pt again today, pt reports this was his day off work however no answer, left message on VM for a return call. This will now be numerous attempts at trying to reach pt without any success.   Kerry Hough, EMT-Paramedic  03/09/18

## 2018-03-12 ENCOUNTER — Telehealth: Payer: Self-pay | Admitting: Family Medicine

## 2018-03-16 ENCOUNTER — Telehealth (HOSPITAL_COMMUNITY): Payer: Self-pay

## 2018-03-16 ENCOUNTER — Encounter: Payer: Self-pay | Admitting: Family Medicine

## 2018-03-16 NOTE — Telephone Encounter (Signed)
Another unsuccessful attempt to reach pt regarding referral and to make appointment, it rang twice and then it was forwarded to VM. I left another voicemail for return call.  If he does not return call this week then he will be d/c from paramedicine due to unable to reach him.  I have attempted 4 other times to reach pt.   Kerry Hough, EMT-Paramedicine  03/16/18

## 2018-03-18 ENCOUNTER — Telehealth (HOSPITAL_COMMUNITY): Payer: Self-pay | Admitting: Surgery

## 2018-03-18 NOTE — Telephone Encounter (Signed)
Patient will be discharged from HF Community Paramedicine Program secondary to inability to contact and make home visit appointments.

## 2018-04-02 ENCOUNTER — Other Ambulatory Visit (HOSPITAL_COMMUNITY): Payer: Self-pay | Admitting: Internal Medicine

## 2018-04-20 ENCOUNTER — Encounter (HOSPITAL_COMMUNITY): Payer: Self-pay

## 2018-04-20 ENCOUNTER — Ambulatory Visit (HOSPITAL_COMMUNITY)
Admission: RE | Admit: 2018-04-20 | Discharge: 2018-04-20 | Disposition: A | Discharge status: Home / Self Care | Payer: Managed Care, Other (non HMO) | Source: Ambulatory Visit | Attending: Cardiology | Admitting: Cardiology

## 2018-04-20 VITALS — BP 126/94 | HR 87 | Wt >= 6400 oz

## 2018-04-20 DIAGNOSIS — Z79899 Other long term (current) drug therapy: Secondary | ICD-10-CM | POA: Insufficient documentation

## 2018-04-20 DIAGNOSIS — N183 Chronic kidney disease, stage 3 (moderate): Secondary | ICD-10-CM | POA: Insufficient documentation

## 2018-04-20 DIAGNOSIS — I5042 Chronic combined systolic (congestive) and diastolic (congestive) heart failure: Secondary | ICD-10-CM | POA: Diagnosis present

## 2018-04-20 DIAGNOSIS — I480 Paroxysmal atrial fibrillation: Secondary | ICD-10-CM | POA: Diagnosis not present

## 2018-04-20 DIAGNOSIS — I428 Other cardiomyopathies: Secondary | ICD-10-CM | POA: Diagnosis not present

## 2018-04-20 DIAGNOSIS — Z886 Allergy status to analgesic agent status: Secondary | ICD-10-CM | POA: Diagnosis not present

## 2018-04-20 DIAGNOSIS — I5022 Chronic systolic (congestive) heart failure: Secondary | ICD-10-CM

## 2018-04-20 DIAGNOSIS — Z6841 Body Mass Index (BMI) 40.0 and over, adult: Secondary | ICD-10-CM | POA: Diagnosis not present

## 2018-04-20 DIAGNOSIS — I493 Ventricular premature depolarization: Secondary | ICD-10-CM | POA: Diagnosis not present

## 2018-04-20 DIAGNOSIS — R55 Syncope and collapse: Secondary | ICD-10-CM | POA: Diagnosis not present

## 2018-04-20 DIAGNOSIS — N529 Male erectile dysfunction, unspecified: Secondary | ICD-10-CM | POA: Diagnosis not present

## 2018-04-20 DIAGNOSIS — E669 Obesity, unspecified: Secondary | ICD-10-CM | POA: Insufficient documentation

## 2018-04-20 DIAGNOSIS — Z7901 Long term (current) use of anticoagulants: Secondary | ICD-10-CM | POA: Diagnosis not present

## 2018-04-20 DIAGNOSIS — G4733 Obstructive sleep apnea (adult) (pediatric): Secondary | ICD-10-CM | POA: Diagnosis not present

## 2018-04-20 DIAGNOSIS — I272 Pulmonary hypertension, unspecified: Secondary | ICD-10-CM | POA: Insufficient documentation

## 2018-04-20 DIAGNOSIS — Z8249 Family history of ischemic heart disease and other diseases of the circulatory system: Secondary | ICD-10-CM | POA: Insufficient documentation

## 2018-04-20 DIAGNOSIS — I130 Hypertensive heart and chronic kidney disease with heart failure and stage 1 through stage 4 chronic kidney disease, or unspecified chronic kidney disease: Secondary | ICD-10-CM | POA: Diagnosis not present

## 2018-04-20 DIAGNOSIS — I2510 Atherosclerotic heart disease of native coronary artery without angina pectoris: Secondary | ICD-10-CM | POA: Diagnosis not present

## 2018-04-20 LAB — BASIC METABOLIC PANEL
Anion gap: 9 (ref 5–15)
BUN: 15 mg/dL (ref 6–20)
CHLORIDE: 99 mmol/L (ref 98–111)
CO2: 33 mmol/L — ABNORMAL HIGH (ref 22–32)
Calcium: 8.7 mg/dL — ABNORMAL LOW (ref 8.9–10.3)
Creatinine, Ser: 1.63 mg/dL — ABNORMAL HIGH (ref 0.61–1.24)
GFR calc Af Amer: >60 mL/min (ref >60)
GFR calc non Af Amer: 52 mL/min — ABNORMAL LOW (ref >60)
Glucose, Bld: 103 mg/dL — ABNORMAL HIGH (ref 70–99)
POTASSIUM: 3.2 mmol/L — AB (ref 3.5–5.1)
SODIUM: 141 mmol/L (ref 135–145)

## 2018-04-20 LAB — URIC ACID: URIC ACID, SERUM: 9.5 mg/dL — AB (ref 3.7–8.6)

## 2018-04-20 MED ORDER — POTASSIUM CHLORIDE ER 20 MEQ PO TBCR: 20.0000 meq | EXTENDED_RELEASE_TABLET | Freq: Every day | ORAL | 11 refills | Status: DC

## 2018-04-20 MED ORDER — AMIODARONE HCL 200 MG PO TABS: 200.0000 mg | ORAL_TABLET | Freq: Two times a day (BID) | ORAL | 11 refills | Status: DC

## 2018-04-20 MED ORDER — DIGOXIN 250 MCG PO TABS: 0.2500 mg | ORAL_TABLET | Freq: Every day | ORAL | 11 refills | Status: DC

## 2018-04-20 MED ORDER — SILDENAFIL CITRATE 100 MG PO TABS: 100.0000 mg | ORAL_TABLET | Freq: Every day | ORAL | 3 refills | Status: DC | PRN

## 2018-04-20 MED ORDER — TORSEMIDE 20 MG PO TABS: ORAL_TABLET | ORAL | 11 refills | Status: DC

## 2018-04-20 MED ORDER — CARVEDILOL 3.125 MG PO TABS: 3.1250 mg | ORAL_TABLET | Freq: Two times a day (BID) | ORAL | 11 refills | Status: DC

## 2018-04-20 MED ORDER — LOSARTAN POTASSIUM 25 MG PO TABS: 25.0000 mg | ORAL_TABLET | Freq: Every day | ORAL | 11 refills | Status: DC

## 2018-04-20 MED ORDER — ISOSORBIDE MONONITRATE ER 60 MG PO TB24: 60.0000 mg | ORAL_TABLET | Freq: Every day | ORAL | 11 refills | Status: DC

## 2018-04-20 MED ORDER — HYDRALAZINE HCL 50 MG PO TABS: 50.0000 mg | ORAL_TABLET | Freq: Three times a day (TID) | ORAL | 11 refills | Status: DC

## 2018-04-20 MED ORDER — ATORVASTATIN CALCIUM 40 MG PO TABS: 40.0000 mg | ORAL_TABLET | Freq: Every day | ORAL | 11 refills | Status: DC

## 2018-04-20 MED ORDER — WARFARIN SODIUM 7.5 MG PO TABS: 7.5000 mg | ORAL_TABLET | Freq: Every day | ORAL | 6 refills | Status: DC

## 2018-04-20 MED ORDER — SPIRONOLACTONE 25 MG PO TABS: 12.5000 mg | ORAL_TABLET | Freq: Every day | ORAL | 11 refills | Status: DC

## 2018-04-20 NOTE — Progress Notes (Signed)
Advanced Heart Failure Clinic Note    Primary Care: Dr Clelia Croft. INR followed by PCP.  Primary Cardiologist: Dr. Gala Romney   HPI: Mark Baker is a 37 y.o. male with a past medical history of NICM, chronic combined systolic and diastolic CHF (EF 46-96%), OSA, obesity, PAF, and medical noncompliance.   Admitted 10/31/16-11/14/16 with atrial fibrillation RVR and low output (co ox 38%). Started on Amiodarone and milrinone. TEE on 11/07/16 - EF 20% RV severely HK Massive LA (7.7cm), Severely dilated RA, Normal AV, Severe central MR, Moderate TR (RVSP 65-70), Trivial PR.  He spontaneously converted to NSR. He diuresed 44 pounds, discharge weight was 477 pounds. Discharged on torsemide 40mg  daily. Renal function inhibited starting ARB/ARNI.   Admitted 01/01/17-01/02/17 with syncope, event was triggered by an argument at home. Outpatient monitor was suggested. He remained in NSR on telemetry overnight.   Admitted 4/16-4/17/19 with CP and syncope. EKG unchanged and cardiac enzymes were flat. Orthostatics negative. Weight was up 30 lbs and he was diuresed with IV lasix, then transitioned to torsemide 40 mg am, 20 mg pm. Losartan was stopped and he was started on Entresto. DC weight: 499 lbs.   Saw Dr Elberta Fortis for possible ICD. He had recommendations to lose weight due to higher complication risk.   Today he returns for HF follow up. He has not been taking HF meds in the 10 days. He thought he needed refills. Overall feeling fine. Denies PND/Orthopnea.  SOB with steps. Using CPAP nightly. Appetite ok.Tries to eat more protein and less pasta and breads.  No fever or chills. Weight at home  490-495 pounds. Continues to work full time at [employer].    Holter Monitor 01/2017: NSR with occasional PVCs. No recurrent AF or arrhythmias.   R/LHC 03/2017:  Mid RCA lesion, 20 %stenosed.   Findings:  Ao = 118/88 (97) LV = 132/28 RA = 9 RV = 54/9 PA = 51/20 (34) PCW = 22 Fick cardiac output/index = 9.9/3.2 PVR = 1.2  WU SVR = 708 dynes Ao sat = 88% PA sat = 68%,   Assessment: 1. Minimal non-obstructive CAD 2. Mild pulmonary HTN 3. NICM with EF ~25-30% though hard to assess EF on LV gram due to poor opacification  Echo 11/2017: EF 20-25%, moderate LVH, diffuse HK, akinesis of the inferolateral, inferior, and inferoseptal myocardium, mild MR, severe dilation of RA and LA  Review of systems complete and found to be negative unless listed in HPI.   Past Medical History:  Diagnosis Date  . Childhood asthma    no exacerbation since age 71   . Chronic combined systolic and diastolic CHF (congestive heart failure) (HCC)    a) ECHO (11/2012): EF 40-45%, RV fx difficult to see, nl size b) ECHO (05/2014): EF 20-25%, grade 2 DD, RV mildly dilated and sys fx mod reduced  . Hypertension   . Migraine    "down to maybe 2/year now; used to have them q other week" (11/26/2017)  . NICM (nonischemic cardiomyopathy) (HCC)    a) (11/2012): normal coronaries  . OSA on CPAP     Current Outpatient Medications  Medication Sig Dispense Refill  . amiodarone (PACERONE) 200 MG tablet Take 1 tablet (200 mg total) by mouth 2 (two) times daily. (Patient not taking: Reported on 04/20/2018) 180 tablet 3  . atorvastatin (LIPITOR) 40 MG tablet Take 1 tablet (40 mg total) by mouth daily. (Patient not taking: Reported on 04/20/2018) 90 tablet 3  . carvedilol (COREG) 3.125 MG tablet Take  1 tablet (3.125 mg total) by mouth 2 (two) times daily. (Patient not taking: Reported on 04/20/2018) 180 tablet 3  . digoxin (LANOXIN) 0.25 MG tablet Take 1 tablet (0.25 mg total) by mouth daily. (Patient not taking: Reported on 04/20/2018) 30 tablet 3  . hydrALAZINE (APRESOLINE) 50 MG tablet Take 1 tablet (50 mg total) by mouth every 8 (eight) hours. (Patient not taking: Reported on 04/20/2018) 90 tablet 6  . isosorbide mononitrate (IMDUR) 60 MG 24 hr tablet Take 1 tablet (60 mg total) by mouth daily. (Patient not taking: Reported on 04/20/2018) 90 tablet 3  .  losartan (COZAAR) 25 MG tablet Take 25 mg by mouth daily.    . potassium chloride 20 MEQ TBCR Take 20 mEq by mouth daily. (Patient not taking: Reported on 04/20/2018) 90 tablet 3  . sildenafil (VIAGRA) 100 MG tablet Take 1 tablet (100 mg total) by mouth daily as needed for erectile dysfunction. (Patient not taking: Reported on 04/20/2018) 5 tablet 3  . spironolactone (ALDACTONE) 25 MG tablet Take 0.5 tablets (12.5 mg total) by mouth daily. (Patient not taking: Reported on 04/20/2018) 15 tablet 3  . torsemide (DEMADEX) 20 MG tablet Take 1-2 tablets (20-40 mg total) by mouth See admin instructions. Take 40 mg (2 Tablets) in the AM and 20 mg (1 Tablet) in the PM (Patient not taking: Reported on 04/20/2018) 90 tablet 3  . warfarin (COUMADIN) 7.5 MG tablet Take 1 tablet (7.5 mg total) by mouth daily at 6 PM. (Patient not taking: Reported on 04/20/2018) 45 tablet 6   No current facility-administered medications for this encounter.     Allergies  Allergen Reactions  . Entresto [Sacubitril-Valsartan] Cough    Was on Entresto 07/2016-09/2016  . Aspirin Hives, Itching and Rash      Social History   Socioeconomic History  . Marital status: Married    Spouse name: Not on file  . Number of children: Not on file  . Years of education: Not on file  . Highest education level: Not on file  Occupational History  . Not on file  Social Needs  . Financial resource strain: Not on file  . Food insecurity:    Worry: Not on file    Inability: Not on file  . Transportation needs:    Medical: Not on file    Non-medical: Not on file  Tobacco Use  . Smoking status: Never Smoker  . Smokeless tobacco: Never Used  Substance and Sexual Activity  . Alcohol use: Yes    Alcohol/week: 0.0 standard drinks    Comment: 11/26/2017 "nothing since 07/11/2017"  . Drug use: Never  . 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   Lifestyle  . Physical activity:    Days per week: Not on file     Minutes per session: Not on file  . Stress: Not on file  Relationships  . Social connections:    Talks on phone: Not on file    Gets together: Not on file    Attends religious service: Not on file    Active member of club or organization: Not on file    Attends meetings of clubs or organizations: Not on file    Relationship status: Not on file  . Intimate partner violence:    Fear of current or ex partner: Not on file    Emotionally abused: Not on file    Physically abused: Not on file    Forced sexual activity: Not  on file  Other Topics Concern  . Not on file  Social History Narrative   Lives in [place] with wife of 4 years.  Works for [employer] at [facility].  1 son and 2 daughters.     Denies cigarettes. Drank 1 beer last night 11/22/12. Denies drugs             Family History  Problem Relation Age of Onset  . Hypertension Father   . Heart failure Father        paternal side of family   . Hypertension Sister   . Stroke Maternal Grandmother   . Hypertension Unknown        maternal side of family   . Diabetes Neg Hx   . Hyperlipidemia Neg Hx     Vitals:   04/20/18 1112  BP: (!) 126/94  Pulse: 87  SpO2: 98%  Weight: (!) 224.4 kg (494 lb 12.8 oz)   Wt Readings from Last 3 Encounters:  04/20/18 (!) 224.4 kg (494 lb 12.8 oz)  01/15/18 (!) 229.6 kg (506 lb 3.2 oz)  12/29/17 (!) 227.7 kg (502 lb)    PHYSICAL EXAM: General:  Well appearing. No resp difficulty HEENT: normal Neck: supple. no JVD. Carotids 2+ bilat; no bruits. No lymphadenopathy or thryomegaly appreciated. Cor: PMI nondisplaced. Regular rate & rhythm. No rubs, gallops or murmurs. Lungs: clear Abdomen: obese,  soft, nontender, nondistended. No hepatosplenomegaly. No bruits or masses. Good bowel sounds. Extremities: no cyanosis, clubbing, rash, R and LLE trace-1+ edema Neuro: alert & orientedx3, cranial nerves grossly intact. moves all 4 extremities w/o difficulty. Affect pleasant    ASSESSMENT & PLAN: 1.  Chronic combined systolic and diastolic CHF: Echo 03/2017 EF 30-35%, RV not well visualized.  10/2016 EF 20-25% NICM, likely tachymediated although noncompliance may play a role. Echo 11/2017: EF 20-25%, moderate LVH, diffuse HK, mild MR, severe dilation of RA and LA -NYHA II. Volume status stable.  - Restart all HF medications.  Restart torsemide 40 mg in the am, 20 mg in the pm, digoxin 0.25 mcg daily,  hydralazine 50 mg TID, Continue imdur 60 mg daily,  spiro 12.5 mg daily, and Coreg 3.125mg  BID, and losartan to 25 mg daily.   -Refuses Entresto -Evaluate by EP for ICD however he needed lose weight for consideration.  BMET today in and in 7 days.    2. Obesity: - Body mass index is 61.85 kg/m.  Reinforced portion control and exercise.   3. OSA - Continue CPAP nightly. Encouraged nightly use.   4. CKD stage III - BMET today - Baseline 1.3-1.5.   5. PAF - Sounds regular. Continue  Continue amiodarone 200 mg BID.  TFT and LFT normal 11/2017. Repeat next visit.  - This patients CHA2DS2-VASc Score and unadjusted Ischemic Stroke Rate (% per year) is equal to 2.2 % stroke rate/year from a score of 2 Above score calculated as 1 point each if present [CHF, HTN, DM, Vascular=MI/PAD/Aortic Plaque, Age if 65-74, or Male], 2 points each if present [Age > 75, or Stroke/TIA/TE] - Continue coumadin for anticoagulation.  - He want his PCP to manage INR.    6. Erectile dysfunction - Continue sildenafil. He understands he should not take imdur if he plan to take sildenafil.    7. Syncope - Telemetry monitor 01/2017: showed NSR with occasional PVCs. No AF or other arrhythmias - [facility] 03/2017: with mild non obstructive CAD, mild pulmonary HTN, EF ~25-30%   8. HTN - Elevated today. Restart medications  as above.  BMET today and in 7 days. Reinforced medication compliance.   Follow up in 6-8 weeks. He will need TSH, LFTs, and dig level at that time.    Tonye Becket, NP 04/20/18

## 2018-04-20 NOTE — Patient Instructions (Signed)
Refilled all meds to pharmacy.  Routine lab work today. Will notify you of abnormal results, otherwise no news is good news!  Return in 1-2 weeks for repeat labs.  _______________________________________________________________________ [place] Code: 1600  Follow up 6-8 weeks.  _______________________________________________________________________ [place] Code: 1700  Take all medication as prescribed the day of your appointment. Bring all medications with you to your appointment.  Do the following things EVERYDAY: 1) Weigh yourself in the morning before breakfast. Write it down and keep it in a log. 2) Take your medicines as prescribed 3) Eat low salt foods-Limit salt (sodium) to 2000 mg per day.  4) Stay as active as you can everyday 5) Limit all fluids for the day to less than 2 liters

## 2018-04-27 ENCOUNTER — Ambulatory Visit (HOSPITAL_COMMUNITY)
Admission: RE | Admit: 2018-04-27 | Discharge: 2018-04-27 | Disposition: A | Discharge status: Home / Self Care | Payer: Managed Care, Other (non HMO) | Source: Ambulatory Visit | Attending: Cardiology | Admitting: Cardiology

## 2018-04-27 ENCOUNTER — Other Ambulatory Visit (HOSPITAL_COMMUNITY): Payer: Self-pay | Admitting: *Deleted

## 2018-04-27 DIAGNOSIS — I5022 Chronic systolic (congestive) heart failure: Secondary | ICD-10-CM | POA: Diagnosis not present

## 2018-04-27 LAB — BASIC METABOLIC PANEL
Anion gap: 13 (ref 5–15)
BUN: 14 mg/dL (ref 6–20)
CHLORIDE: 101 mmol/L (ref 98–111)
CO2: 30 mmol/L (ref 22–32)
CREATININE: 1.52 mg/dL — AB (ref 0.61–1.24)
Calcium: 8.9 mg/dL (ref 8.9–10.3)
GFR calc non Af Amer: 57 mL/min — ABNORMAL LOW (ref >60)
Glucose, Bld: 115 mg/dL — ABNORMAL HIGH (ref 70–99)
POTASSIUM: 3.7 mmol/L (ref 3.5–5.1)
Sodium: 144 mmol/L (ref 135–145)

## 2018-04-29 ENCOUNTER — Other Ambulatory Visit: Payer: Self-pay

## 2018-04-29 ENCOUNTER — Encounter (HOSPITAL_COMMUNITY): Payer: Self-pay | Admitting: Emergency Medicine

## 2018-04-29 ENCOUNTER — Emergency Department (HOSPITAL_COMMUNITY)
Admission: EM | Admit: 2018-04-29 | Discharge: 2018-04-29 | Disposition: A | Discharge status: Home / Self Care | Payer: Managed Care, Other (non HMO) | Attending: Emergency Medicine | Admitting: Emergency Medicine

## 2018-04-29 ENCOUNTER — Emergency Department (HOSPITAL_COMMUNITY): Payer: Managed Care, Other (non HMO)

## 2018-04-29 DIAGNOSIS — I130 Hypertensive heart and chronic kidney disease with heart failure and stage 1 through stage 4 chronic kidney disease, or unspecified chronic kidney disease: Secondary | ICD-10-CM | POA: Diagnosis not present

## 2018-04-29 DIAGNOSIS — Z043 Encounter for examination and observation following other accident: Secondary | ICD-10-CM | POA: Insufficient documentation

## 2018-04-29 DIAGNOSIS — N183 Chronic kidney disease, stage 3 (moderate): Secondary | ICD-10-CM | POA: Insufficient documentation

## 2018-04-29 DIAGNOSIS — I5042 Chronic combined systolic (congestive) and diastolic (congestive) heart failure: Secondary | ICD-10-CM | POA: Insufficient documentation

## 2018-04-29 DIAGNOSIS — Z7901 Long term (current) use of anticoagulants: Secondary | ICD-10-CM | POA: Insufficient documentation

## 2018-04-29 DIAGNOSIS — Z79899 Other long term (current) drug therapy: Secondary | ICD-10-CM | POA: Diagnosis not present

## 2018-04-29 DIAGNOSIS — W109XXA Fall (on) (from) unspecified stairs and steps, initial encounter: Secondary | ICD-10-CM

## 2018-04-29 MED ORDER — OXYCODONE-ACETAMINOPHEN 5-325 MG PO TABS: 1.0000 | ORAL_TABLET | ORAL | 0 refills | Status: DC | PRN

## 2018-04-29 MED ORDER — OXYCODONE-ACETAMINOPHEN 5-325 MG PO TABS
1.0000 | ORAL_TABLET | Freq: Once | ORAL | Status: AC
  Administered 2018-04-29: 1 via ORAL
  Filled 2018-04-29: qty 1

## 2018-04-29 NOTE — ED Triage Notes (Signed)
Per EMS, pt coming from home with complaints of falling down three to four steps and landing on his R knee and R elbow. Pt is on warfarin for his a-fib. Pt denies any LOC, CP or SOB. Pt stated he just missed a step. No shortening of leg noted and pulses are palpable. Full ROM all extremities,.

## 2018-04-29 NOTE — ED Notes (Signed)
Pt called ex-wife for ride home. Pt verbalized understanding of DC instructions and prescriptions.

## 2018-04-29 NOTE — Discharge Instructions (Signed)
Take the prescribed medication as directed. °Follow-up with your primary care doctor. °Return to the ED for new or worsening symptoms. °

## 2018-04-29 NOTE — ED Provider Notes (Signed)
[facility] EMERGENCY DEPARTMENT Provider Note   CSN: [id number] Arrival date & time: 04/29/18  0124     History   Chief Complaint Chief Complaint  Patient presents with  . Fall    HPI Mark Baker is a 37 y.o. male.  The history is provided by the patient and medical records.  Fall     37 year old male with history of asthma, congestive heart failure, hypertension, history of A. fib on Coumadin, presenting to the ED after a fall.  Patient reports he was standing on his steps about 3-4 steps up, lost his footing, missed a step and fell.  States stairs are carpeted but he landed on the hardwood floor.  He denies any head injury or loss of consciousness.  States he struck his right knee and right elbow on the hardwood floor.  States his ongoing pain in these areas.  Denies any new numbness or weakness.  States due for Coumadin check next week, has not had any issues with his levels recently.  No intervention tried PTA.  Past Medical History:  Diagnosis Date  . Childhood asthma    no exacerbation since age 2   . Chronic combined systolic and diastolic CHF (congestive heart failure) (HCC)    a) ECHO (11/2012): EF 40-45%, RV fx difficult to see, nl size b) ECHO (05/2014): EF 20-25%, grade 2 DD, RV mildly dilated and sys fx mod reduced  . Hypertension   . Migraine    "down to maybe 2/year now; used to have them q other week" (11/26/2017)  . NICM (nonischemic cardiomyopathy) (HCC)    a) (11/2012): normal coronaries  . OSA on CPAP     Patient Active Problem List   Diagnosis Date Noted  . Vitamin D deficiency 12/10/2017  . Volume overload 11/25/2017  . Syncope 01/01/2017  . Erectile dysfunction 12/25/2016  . Encounter for therapeutic drug monitoring 11/19/2016  . Paroxysmal atrial fibrillation (HCC)   . Chronic HFrEF (heart failure with reduced ejection fraction) (HCC) 07/05/2016  . Morbid obesity with body mass index (BMI) of 60.0 to 69.9 in adult [facility])  07/05/2016  . CKD (chronic kidney disease), stage III (HCC) 07/05/2016  . Hyperlipidemia 11/25/2012  . Prolonged QT interval 11/24/2012  . Healthcare maintenance 09/03/2012  . HTN (hypertension) 02/19/2012  . Sleep apnea 02/10/2012  . Morbid obesity (HCC) 02/10/2012    Past Surgical History:  Procedure Laterality Date  . CARDIOVERSION N/A 11/07/2016   Procedure: CARDIOVERSION;  Surgeon: Dolores Patty, MD;  Location: [facility] OR;  Service: Cardiovascular;  Laterality: N/A;  Will need extra help positioning patient  . KNEE ARTHROSCOPY Right 2003   ANTERIOR CRUCIATE LIGAMENT REPAIR   . LEFT HEART CATHETERIZATION WITH CORONARY ANGIOGRAM N/A 11/24/2012   Procedure: LEFT HEART CATHETERIZATION WITH CORONARY ANGIOGRAM;  Surgeon: Peter M Swaziland, MD;  Location: [facility] CATH LAB;  Service: Cardiovascular;  Laterality: N/A;  . RIGHT/LEFT HEART CATH AND CORONARY ANGIOGRAPHY N/A 03/18/2017   Procedure: Right/Left Heart Cath and Coronary Angiography;  Surgeon: Dolores Patty, MD;  Location: [facility] INVASIVE CV LAB;  Service: Cardiovascular;  Laterality: N/A;  . TEE WITHOUT CARDIOVERSION N/A 11/07/2016   Procedure: TRANSESOPHAGEAL ECHOCARDIOGRAM (TEE);  Surgeon: Dolores Patty, MD;  Location: [facility] OR;  Service: Cardiovascular;  Laterality: N/A;        Home Medications    Prior to Admission medications   Medication Sig Start Date End Date Taking? Authorizing Provider  amiodarone (PACERONE) 200 MG tablet Take 1 tablet (200 mg  total) by mouth 2 (two) times daily. 04/20/18   Clegg, Amy D, NP  atorvastatin (LIPITOR) 40 MG tablet Take 1 tablet (40 mg total) by mouth daily. 04/20/18   Clegg, Amy D, NP  carvedilol (COREG) 3.125 MG tablet Take 1 tablet (3.125 mg total) by mouth 2 (two) times daily. 04/20/18   Clegg, Amy D, NP  digoxin (LANOXIN) 0.25 MG tablet Take 1 tablet (0.25 mg total) by mouth daily. 04/20/18   Clegg, Amy D, NP  hydrALAZINE (APRESOLINE) 50 MG tablet Take 1 tablet (50 mg total) by mouth every 8 (eight)  hours. 04/20/18   Clegg, Amy D, NP  isosorbide mononitrate (IMDUR) 60 MG 24 hr tablet Take 1 tablet (60 mg total) by mouth daily. 04/20/18   Clegg, Amy D, NP  losartan (COZAAR) 25 MG tablet Take 1 tablet (25 mg total) by mouth daily. 04/20/18   Clegg, Amy D, NP  Potassium Chloride ER 20 MEQ TBCR Take 20 mEq by mouth daily. 04/20/18   Clegg, Amy D, NP  sildenafil (VIAGRA) 100 MG tablet Take 1 tablet (100 mg total) by mouth daily as needed for erectile dysfunction. 04/20/18   Clegg, Amy D, NP  spironolactone (ALDACTONE) 25 MG tablet Take 0.5 tablets (12.5 mg total) by mouth daily. 04/20/18   Clegg, Amy D, NP  torsemide (DEMADEX) 20 MG tablet Take 40 mg (2 Tablets) in the AM and 20 mg (1 Tablet) in the PM 04/20/18   Clegg, Amy D, NP  warfarin (COUMADIN) 7.5 MG tablet Take 1 tablet (7.5 mg total) by mouth daily at 6 PM. 04/20/18   Clegg, Amy D, NP    Family History Family History  Problem Relation Age of Onset  . Hypertension Father   . Heart failure Father        paternal side of family   . Hypertension Sister   . Stroke Maternal Grandmother   . Hypertension Unknown        maternal side of family   . Diabetes Neg Hx   . Hyperlipidemia Neg Hx     Social History Social History   Tobacco Use  . Smoking status: Never Smoker  . Smokeless tobacco: Never Used  Substance Use Topics  . Alcohol use: Yes    Alcohol/week: 0.0 standard drinks    Comment: 11/26/2017 "nothing since 07/11/2017"  . Drug use: Never     Allergies   Entresto [sacubitril-valsartan] and Aspirin   Review of Systems Review of Systems  Musculoskeletal: Positive for arthralgias.  All other systems reviewed and are negative.    Physical Exam Updated Vital Signs BP (!) 147/85 (BP Location: Left Wrist)   Pulse 84   Temp 99.2 F (37.3 C) (Oral)   Resp (!) 26   Ht 6\' 3"  (1.905 m)   Wt (!) 224.5 kg   SpO2 92%   BMI 61.87 kg/m   Physical Exam  Constitutional: He is oriented to person, place, and time. He appears  well-developed and well-nourished.  Morbidly obese  HENT:  Head: Normocephalic and atraumatic.  Mouth/Throat: Oropharynx is clear and moist.  No visible signs of head trauma  Eyes: Pupils are equal, round, and reactive to light. Conjunctivae and EOM are normal.  Neck: Normal range of motion.  Cardiovascular: Normal rate, regular rhythm and normal heart sounds.  Pulmonary/Chest: Effort normal and breath sounds normal.  Abdominal: Soft. Bowel sounds are normal.  Musculoskeletal: Normal range of motion.  Tenderness of right elbow around the olecranon and right knee just superior  to the patella; no gross bony deformities; full ROM maintained but some pain when doing so; pulses intact to the extremities No leg shortening  Neurological: He is alert and oriented to person, place, and time.  Skin: Skin is warm and dry.  Psychiatric: He has a normal mood and affect.  Nursing note and vitals reviewed.    ED Treatments / Results  Labs (all labs ordered are listed, but only abnormal results are displayed) Labs Reviewed - No data to display  EKG None  Radiology Dg Elbow Complete Right  Result Date: 04/29/2018 CLINICAL DATA:  Initial evaluation for acute trauma, fall. EXAM: RIGHT ELBOW - COMPLETE 3+ VIEW COMPARISON:  None. FINDINGS: No acute fracture or dislocation. No joint effusion. Radial head intact. Degenerative spurring present at the olecranon. No acute soft tissue abnormality. IMPRESSION: No acute osseous abnormality about the elbow. Electronically Signed   By: Rise Mu M.D.   On: 04/29/2018 03:36   Dg Knee Complete 4 Views Right  Result Date: 04/29/2018 CLINICAL DATA:  Initial evaluation for acute trauma, fall. EXAM: RIGHT KNEE - COMPLETE 4+ VIEW COMPARISON:  None. FINDINGS: No acute fracture or dislocation. No definite joint effusion. Mild tricompartmental degenerative osteoarthrosis. Osseous mineralization normal. No soft tissue abnormality. IMPRESSION: 1. No acute  osseous abnormality about the knee. 2. Mild tricompartmental degenerative osteoarthrosis. Electronically Signed   By: Rise Mu M.D.   On: 04/29/2018 03:38    Procedures Procedures (including critical care time)  Medications Ordered in ED Medications  oxyCODONE-acetaminophen (PERCOCET/ROXICET) 5-325 MG per tablet 1 tablet (1 tablet Oral Given 04/29/18 0400)     Initial Impression / Assessment and Plan / ED Course  I have reviewed the triage vital signs and the nursing notes.  Pertinent labs & imaging results that were available during my care of the patient were reviewed by me and considered in my medical decision making (see chart for details).  37 year old male here after a fall.  Lost his footing on the steps and fell down approximately 3 carpeted steps, landed on hardwood floor.  No head injury or loss of consciousness.  He is on Coumadin for A. fib, due to have INR checked next week.  He is awake, alert, appropriately oriented.  He has no signs of visible head trauma on exam.  Complains of pain in the right elbow and knee.  No gross deformities noted on exam.  Extremities are neurovascularly intact.  Screening x-rays obtained and are negative.  Patient is ambulatory here in the ED but with some soreness.  Will discharge home with symptomatic care for the next few days.  Given a work note.  Close follow-up with PCP encouraged.  Discussed plan with patient, he acknowledged understanding and agreed with plan of care.  Return precautions given for new or worsening symptoms.  Final Clinical Impressions(s) / ED Diagnoses   Final diagnoses:  Fall on stairs, initial encounter    ED Discharge Orders         Ordered    oxyCODONE-acetaminophen (PERCOCET/ROXICET) 5-325 MG tablet  Every 4 hours PRN     04/29/18 0423           Garlon Hatchet, PA-C 04/29/18 7867    Zadie Rhine, MD 04/29/18 2306

## 2018-05-04 ENCOUNTER — Telehealth (HOSPITAL_COMMUNITY): Payer: Self-pay

## 2018-05-04 NOTE — Telephone Encounter (Signed)
Patient returned call and stated start date for work would be 04/17/2018 with restrictions per our office/provider recommendation. Paperwork finalized and faxed to provided # [phone] (17 total pages). Copy of forms scanned into patient's electronic medical record under media tab for reference.  Ave Filter, RN

## 2018-05-04 NOTE — Telephone Encounter (Signed)
Left VM stating return to work paperwork completed and signed by Dr. Gala Romney. Stated there were some blanks pertaining to start date of his leave and return to work date.  That this could be filled in by himself or we can fill this out and fax for him and to return our call or come to office to clarify. Paperwork filed in disability folder in CHF clinic pending patient response.  Ave Filter, RN

## 2018-05-06 ENCOUNTER — Encounter (HOSPITAL_COMMUNITY): Payer: Self-pay | Admitting: Emergency Medicine

## 2018-05-06 ENCOUNTER — Emergency Department (HOSPITAL_COMMUNITY): Payer: Managed Care, Other (non HMO)

## 2018-05-06 ENCOUNTER — Emergency Department (HOSPITAL_COMMUNITY)
Admission: EM | Admit: 2018-05-06 | Discharge: 2018-05-06 | Disposition: A | Discharge status: Home / Self Care | Payer: Managed Care, Other (non HMO) | Attending: Emergency Medicine | Admitting: Emergency Medicine

## 2018-05-06 DIAGNOSIS — X58XXXA Exposure to other specified factors, initial encounter: Secondary | ICD-10-CM | POA: Insufficient documentation

## 2018-05-06 DIAGNOSIS — I5042 Chronic combined systolic (congestive) and diastolic (congestive) heart failure: Secondary | ICD-10-CM | POA: Diagnosis not present

## 2018-05-06 DIAGNOSIS — S72431A Displaced fracture of medial condyle of right femur, initial encounter for closed fracture: Secondary | ICD-10-CM | POA: Insufficient documentation

## 2018-05-06 DIAGNOSIS — M25561 Pain in right knee: Secondary | ICD-10-CM

## 2018-05-06 DIAGNOSIS — I130 Hypertensive heart and chronic kidney disease with heart failure and stage 1 through stage 4 chronic kidney disease, or unspecified chronic kidney disease: Secondary | ICD-10-CM | POA: Insufficient documentation

## 2018-05-06 DIAGNOSIS — Y929 Unspecified place or not applicable: Secondary | ICD-10-CM | POA: Diagnosis not present

## 2018-05-06 DIAGNOSIS — Z79899 Other long term (current) drug therapy: Secondary | ICD-10-CM | POA: Diagnosis not present

## 2018-05-06 DIAGNOSIS — N183 Chronic kidney disease, stage 3 (moderate): Secondary | ICD-10-CM | POA: Diagnosis not present

## 2018-05-06 DIAGNOSIS — Y939 Activity, unspecified: Secondary | ICD-10-CM | POA: Diagnosis not present

## 2018-05-06 DIAGNOSIS — Y999 Unspecified external cause status: Secondary | ICD-10-CM | POA: Diagnosis not present

## 2018-05-06 DIAGNOSIS — S76111A Strain of right quadriceps muscle, fascia and tendon, initial encounter: Secondary | ICD-10-CM

## 2018-05-06 NOTE — Discharge Instructions (Addendum)
I have provided a referral to Dr. Linna Caprice please call TODAY to schedule an appointment to see him. I have provided a knee immobilizer please use as directed.Do not place any weight on right leg while ambulating with your crutches.Keep leg elevated while sitting down. If your symptoms worsen, please return to the ED for reevaluation.

## 2018-05-06 NOTE — ED Triage Notes (Signed)
Pt presents to ED for assessment of right knee continuing from a fall last week.  States he is concerned "for a torn quad".

## 2018-05-06 NOTE — ED Provider Notes (Addendum)
[facility] EMERGENCY DEPARTMENT Provider Note   CSN: [id number] Arrival date & time: 05/06/18  1305     History   Chief Complaint Chief Complaint  Patient presents with  . Knee Pain    HPI Mark Baker is a 37 y.o. male.  37 y/o male with a PMH of HTN. CHF presents to the ED with a chief complaint of right knee pain x 2 days. Patient reports he was seen 1 week ago and discharge with percocet.He reports his condition has worsen, 2 days ago he began to feel swelling and pain which he describes as "cutting into my leg" mainly above the right knee and thigh. He states the pain is worse with ambulation and he cannot weight bear without his crutches.      Past Medical History:  Diagnosis Date  . Childhood asthma    no exacerbation since age 40   . Chronic combined systolic and diastolic CHF (congestive heart failure) (HCC)    a) ECHO (11/2012): EF 40-45%, RV fx difficult to see, nl size b) ECHO (05/2014): EF 20-25%, grade 2 DD, RV mildly dilated and sys fx mod reduced  . Hypertension   . Migraine    "down to maybe 2/year now; used to have them q other week" (11/26/2017)  . NICM (nonischemic cardiomyopathy) (HCC)    a) (11/2012): normal coronaries  . OSA on CPAP     Patient Active Problem List   Diagnosis Date Noted  . Vitamin D deficiency 12/10/2017  . Volume overload 11/25/2017  . Syncope 01/01/2017  . Erectile dysfunction 12/25/2016  . Encounter for therapeutic drug monitoring 11/19/2016  . Paroxysmal atrial fibrillation (HCC)   . Chronic HFrEF (heart failure with reduced ejection fraction) (HCC) 07/05/2016  . Morbid obesity with body mass index (BMI) of 60.0 to 69.9 in adult [facility]) 07/05/2016  . CKD (chronic kidney disease), stage III (HCC) 07/05/2016  . Hyperlipidemia 11/25/2012  . Prolonged QT interval 11/24/2012  . Healthcare maintenance 09/03/2012  . HTN (hypertension) 02/19/2012  . Sleep apnea 02/10/2012  . Morbid obesity (HCC) 02/10/2012     Past Surgical History:  Procedure Laterality Date  . CARDIOVERSION N/A 11/07/2016   Procedure: CARDIOVERSION;  Surgeon: Dolores Patty, MD;  Location: [facility] OR;  Service: Cardiovascular;  Laterality: N/A;  Will need extra help positioning patient  . KNEE ARTHROSCOPY Right 2003   ANTERIOR CRUCIATE LIGAMENT REPAIR   . LEFT HEART CATHETERIZATION WITH CORONARY ANGIOGRAM N/A 11/24/2012   Procedure: LEFT HEART CATHETERIZATION WITH CORONARY ANGIOGRAM;  Surgeon: Peter M Swaziland, MD;  Location: [facility] CATH LAB;  Service: Cardiovascular;  Laterality: N/A;  . RIGHT/LEFT HEART CATH AND CORONARY ANGIOGRAPHY N/A 03/18/2017   Procedure: Right/Left Heart Cath and Coronary Angiography;  Surgeon: Dolores Patty, MD;  Location: [facility] INVASIVE CV LAB;  Service: Cardiovascular;  Laterality: N/A;  . TEE WITHOUT CARDIOVERSION N/A 11/07/2016   Procedure: TRANSESOPHAGEAL ECHOCARDIOGRAM (TEE);  Surgeon: Dolores Patty, MD;  Location: [facility] OR;  Service: Cardiovascular;  Laterality: N/A;        Home Medications    Prior to Admission medications   Medication Sig Start Date End Date Taking? Authorizing Provider  amiodarone (PACERONE) 200 MG tablet Take 1 tablet (200 mg total) by mouth 2 (two) times daily. 04/20/18   Clegg, Amy D, NP  atorvastatin (LIPITOR) 40 MG tablet Take 1 tablet (40 mg total) by mouth daily. 04/20/18   Clegg, Amy D, NP  carvedilol (COREG) 3.125 MG tablet Take 1 tablet (3.125  mg total) by mouth 2 (two) times daily. 04/20/18   Clegg, Amy D, NP  digoxin (LANOXIN) 0.25 MG tablet Take 1 tablet (0.25 mg total) by mouth daily. 04/20/18   Clegg, Amy D, NP  hydrALAZINE (APRESOLINE) 50 MG tablet Take 1 tablet (50 mg total) by mouth every 8 (eight) hours. 04/20/18   Clegg, Amy D, NP  isosorbide mononitrate (IMDUR) 60 MG 24 hr tablet Take 1 tablet (60 mg total) by mouth daily. 04/20/18   Clegg, Amy D, NP  losartan (COZAAR) 25 MG tablet Take 1 tablet (25 mg total) by mouth daily. 04/20/18   Clegg, Amy D, NP   oxyCODONE-acetaminophen (PERCOCET/ROXICET) 5-325 MG tablet Take 1 tablet by mouth every 4 (four) hours as needed. 04/29/18   Garlon Hatchet, PA-C  Potassium Chloride ER 20 MEQ TBCR Take 20 mEq by mouth daily. 04/20/18   Clegg, Amy D, NP  sildenafil (VIAGRA) 100 MG tablet Take 1 tablet (100 mg total) by mouth daily as needed for erectile dysfunction. 04/20/18   Clegg, Amy D, NP  spironolactone (ALDACTONE) 25 MG tablet Take 0.5 tablets (12.5 mg total) by mouth daily. 04/20/18   Clegg, Amy D, NP  torsemide (DEMADEX) 20 MG tablet Take 40 mg (2 Tablets) in the AM and 20 mg (1 Tablet) in the PM 04/20/18   Clegg, Amy D, NP  warfarin (COUMADIN) 7.5 MG tablet Take 1 tablet (7.5 mg total) by mouth daily at 6 PM. 04/20/18   Clegg, Amy D, NP    Family History Family History  Problem Relation Age of Onset  . Hypertension Father   . Heart failure Father        paternal side of family   . Hypertension Sister   . Stroke Maternal Grandmother   . Hypertension Unknown        maternal side of family   . Diabetes Neg Hx   . Hyperlipidemia Neg Hx     Social History Social History   Tobacco Use  . Smoking status: Never Smoker  . Smokeless tobacco: Never Used  Substance Use Topics  . Alcohol use: Yes    Alcohol/week: 0.0 standard drinks    Comment: 11/26/2017 "nothing since 07/11/2017"  . Drug use: Never     Allergies   Entresto [sacubitril-valsartan] and Aspirin   Review of Systems Review of Systems  Constitutional: Negative for fever.  Musculoskeletal: Positive for arthralgias. Negative for back pain.  All other systems reviewed and are negative.    Physical Exam Updated Vital Signs BP (!) 184/111 (BP Location: Left Arm)   Pulse 91   Temp 99 F (37.2 C) (Oral)   Resp 17   SpO2 99%   Physical Exam  Constitutional: He is oriented to person, place, and time. He appears well-developed and well-nourished.  HENT:  Head: Normocephalic and atraumatic.  Neck: Normal range of motion. Neck supple.   Cardiovascular: Normal heart sounds.  Pulmonary/Chest: Breath sounds normal.  Abdominal: Soft.  Musculoskeletal: He exhibits edema and tenderness. He exhibits no deformity.       Right knee: He exhibits decreased range of motion, swelling and erythema. He exhibits no effusion, no deformity and no laceration.       Right upper leg: He exhibits tenderness, swelling and edema. He exhibits no deformity and no laceration.       Legs: There is swelling noted above right knee, along thigh region.  K/F,KE 2/5 strength H/E,HF 2/5 strength   Neurological: He is alert and oriented to person,  place, and time.  Skin: Skin is warm and dry.  Nursing note and vitals reviewed.    ED Treatments / Results  Labs (all labs ordered are listed, but only abnormal results are displayed) Labs Reviewed - No data to display  EKG None  Radiology Ct Knee Right Wo Contrast  Result Date: 05/06/2018 CLINICAL DATA:  Knee pain which has been worsening. Decreased range of motion. Status post fall last week. EXAM: CT OF THE RIGHT KNEE WITHOUT CONTRAST TECHNIQUE: Multidetector CT imaging of the RIGHT knee was performed according to the standard protocol. Multiplanar CT image reconstructions were also generated. COMPARISON:  None. FINDINGS: Bones/Joint/Cartilage Subtle nondisplaced fracture of the posterior periphery of medial femoral condyle (image 84/series 6). No other fracture or dislocation. Normal alignment. No joint effusion. No aggressive osseous lesion. Tiny tricompartmental marginal osteophytes. Ligaments Collateral ligaments are intact. Muscles and Tendons Muscles are normal. No muscle atrophy. High-grade partial versus complete tear of distal quadriceps tendon adjacent to the patellar insertion with small amount of hemorrhagic fluid surrounding the torn tendon. Soft tissue No fluid collection or hematoma.  No soft tissue mass. IMPRESSION: 1. High-grade partial versus complete tear of distal quadriceps tendon  adjacent to the patellar insertion with small amount of hemorrhagic fluid surrounding the torn tendon. 2. Subtle nondisplaced fracture of the posterior periphery of medial femoral condyle (image 84/series 6). Electronically Signed   By: Elige Ko   On: 05/06/2018 14:43    Procedures Procedures (including critical care time)  Medications Ordered in ED Medications - No data to display   Initial Impression / Assessment and Plan / ED Course  I have reviewed the triage vital signs and the nursing notes.  Pertinent labs & imaging results that were available during my care of the patient were reviewed by me and considered in my medical decision making (see chart for details).     Patient presents with right knee pain with ambulation, and swelling which began 2 days ago. He reports an increased and swelling and pain with ambulation prompting him to come in.  CT imaging ordered of his right knee at triage to rule out any tear.  CT right knee showed high-grade partial versus complete tear of the distal quadriceps tendon adjacent to the patellar insertion with a small amount of hemorrhagic fluid surrounding the torn tendon, subtle nondisplaced fracture over the posterior periphery of the medial femoral condyle.  At this time I have placed a call to Dale Vineland from orthopedics to come in and evaluate patient.  3:09 PM Spoke to Charma Igo PA advised for patient to be placed on knee immobilizer and follow up with Dr. Linna Caprice this week in office.  I will provide patient with referral for orthopedics and instructions to contact Dr. Linna Caprice office.   And to follow-up with orthopedist this week, he said rested a work note which I will provide for him to have time off until Monday while he makes the appointment with orthopedist.  Patient is to not weight-bear but has crutches for support.  He was also provided with pain medication Percocet last time he was in the ED which she still currently  has.  Patient understands and agrees with plan return precautions provided. Final Clinical Impressions(s) / ED Diagnoses   Final diagnoses:  Rupture of right quadriceps tendon, initial encounter  Acute pain of right knee  Closed displaced fracture of medial condyle of right femur, initial encounter [facility])    ED Discharge Orders    None  Claude Manges, PA-C 05/06/18 1542    Claude Manges, PA-C 05/06/18 1543    Marily Memos, MD 05/07/18 1443

## 2018-05-06 NOTE — ED Provider Notes (Signed)
Patient placed in Quick Look pathway, seen and evaluated   Chief Complaint: right leg pain after fall  HPI:   Mark Baker is a 37 y.o. male persistent pain after a fall.  Patient was seen on 9/18 after he had a mechanical fall.  Despite treatment pain is increased and the knee has become increasingly swollen. Difficulty bending and straightening the knee which is keeping him from driving. He has had to continue to use crutches to get around, no weakness or numbness. Was not given any orthopedic follow up. No issues with his CHF and no swelling elsewhere. Did not take BP meds today.  ROS: + knee pain, leg pain, - numbness, weakness  Physical Exam:   Gen: No distress  Neuro: Awake and Alert  Skin: Warm    Focused Exam: tenderness over the right knee and right upper leg, no tenderness at hip, swelling noted over the knee, difficulty with flexion and extension, distal pulses and sensation intact   Initiation of care has begun. The patient has been counseled on the process, plan, and necessity for staying for the completion/evaluation, and the remainder of the medical screening examination    Legrand Rams 05/06/18 1324    Derwood Kaplan, MD 05/08/18 1535

## 2018-05-20 ENCOUNTER — Other Ambulatory Visit: Payer: Self-pay

## 2018-05-20 ENCOUNTER — Emergency Department (HOSPITAL_COMMUNITY)
Admission: EM | Admit: 2018-05-20 | Discharge: 2018-05-20 | Disposition: A | Discharge status: Home / Self Care | Payer: Managed Care, Other (non HMO) | Attending: Emergency Medicine | Admitting: Emergency Medicine

## 2018-05-20 ENCOUNTER — Encounter (HOSPITAL_COMMUNITY): Payer: Self-pay | Admitting: *Deleted

## 2018-05-20 DIAGNOSIS — M25561 Pain in right knee: Secondary | ICD-10-CM | POA: Diagnosis present

## 2018-05-20 DIAGNOSIS — M62161 Other rupture of muscle (nontraumatic), right lower leg: Secondary | ICD-10-CM | POA: Insufficient documentation

## 2018-05-20 DIAGNOSIS — N183 Chronic kidney disease, stage 3 (moderate): Secondary | ICD-10-CM | POA: Diagnosis not present

## 2018-05-20 DIAGNOSIS — S76111D Strain of right quadriceps muscle, fascia and tendon, subsequent encounter: Secondary | ICD-10-CM

## 2018-05-20 DIAGNOSIS — Z79899 Other long term (current) drug therapy: Secondary | ICD-10-CM | POA: Insufficient documentation

## 2018-05-20 DIAGNOSIS — I129 Hypertensive chronic kidney disease with stage 1 through stage 4 chronic kidney disease, or unspecified chronic kidney disease: Secondary | ICD-10-CM | POA: Insufficient documentation

## 2018-05-20 DIAGNOSIS — Z7901 Long term (current) use of anticoagulants: Secondary | ICD-10-CM | POA: Diagnosis not present

## 2018-05-20 NOTE — ED Triage Notes (Signed)
Pt arrived with crutches from home. Pt observed to be ambulatory to room.

## 2018-05-20 NOTE — Discharge Instructions (Addendum)
We are not able to complete nonemergent MRIs here in the emergency department.  Apologized in this process is been very frustrating for you I recommend calling back to Dr. Kathline Magic office or speaking with your insurance company more to try and get this imaging study completed.

## 2018-05-20 NOTE — ED Provider Notes (Signed)
[facility] EMERGENCY DEPARTMENT Provider Note   CSN: [id number] Arrival date & time: 05/20/18  1616     History   Chief Complaint Chief Complaint  Patient presents with  . Knee Pain    HPI Mark Baker is a 37 y.o. male.  Mark Baker is a 37 y.o. Male with a complex medical history as listed below, who presents to the emergency department requesting an MRI of his knee.  Patient has been seen in the emergency department twice and was recently diagnosed with a tear of the right quadriceps tendon as well as a displaced fracture of the medial condyle of the right femur.  Patient has been seen by Dr. Linna Caprice with orthopedics, who has ordered an MRI and reports that patient will need surgery once MRI is completed.  Patient reports he has been unable to get the MRI covered through his insurance company through outpatient imaging so presents to the emergency department requesting MRI today.  He denies any worsening or change in his symptoms and this is been going on for 1 month since he fell landing on his right knee.  Patient arrives using crutches from home.     Past Medical History:  Diagnosis Date  . Childhood asthma    no exacerbation since age 13   . Chronic combined systolic and diastolic CHF (congestive heart failure) (HCC)    a) ECHO (11/2012): EF 40-45%, RV fx difficult to see, nl size b) ECHO (05/2014): EF 20-25%, grade 2 DD, RV mildly dilated and sys fx mod reduced  . Hypertension   . Migraine    "down to maybe 2/year now; used to have them q other week" (11/26/2017)  . NICM (nonischemic cardiomyopathy) (HCC)    a) (11/2012): normal coronaries  . OSA on CPAP     Patient Active Problem List   Diagnosis Date Noted  . Vitamin D deficiency 12/10/2017  . Volume overload 11/25/2017  . Syncope 01/01/2017  . Erectile dysfunction 12/25/2016  . Encounter for therapeutic drug monitoring 11/19/2016  . Paroxysmal atrial fibrillation (HCC)   . Chronic HFrEF (heart  failure with reduced ejection fraction) (HCC) 07/05/2016  . Morbid obesity with body mass index (BMI) of 60.0 to 69.9 in adult [facility]) 07/05/2016  . CKD (chronic kidney disease), stage III (HCC) 07/05/2016  . Hyperlipidemia 11/25/2012  . Prolonged QT interval 11/24/2012  . Healthcare maintenance 09/03/2012  . HTN (hypertension) 02/19/2012  . Sleep apnea 02/10/2012  . Morbid obesity (HCC) 02/10/2012    Past Surgical History:  Procedure Laterality Date  . CARDIOVERSION N/A 11/07/2016   Procedure: CARDIOVERSION;  Surgeon: Dolores Patty, MD;  Location: [facility] OR;  Service: Cardiovascular;  Laterality: N/A;  Will need extra help positioning patient  . KNEE ARTHROSCOPY Right 2003   ANTERIOR CRUCIATE LIGAMENT REPAIR   . LEFT HEART CATHETERIZATION WITH CORONARY ANGIOGRAM N/A 11/24/2012   Procedure: LEFT HEART CATHETERIZATION WITH CORONARY ANGIOGRAM;  Surgeon: Peter M Swaziland, MD;  Location: [facility] CATH LAB;  Service: Cardiovascular;  Laterality: N/A;  . RIGHT/LEFT HEART CATH AND CORONARY ANGIOGRAPHY N/A 03/18/2017   Procedure: Right/Left Heart Cath and Coronary Angiography;  Surgeon: Dolores Patty, MD;  Location: [facility] INVASIVE CV LAB;  Service: Cardiovascular;  Laterality: N/A;  . TEE WITHOUT CARDIOVERSION N/A 11/07/2016   Procedure: TRANSESOPHAGEAL ECHOCARDIOGRAM (TEE);  Surgeon: Dolores Patty, MD;  Location: [facility] OR;  Service: Cardiovascular;  Laterality: N/A;        Home Medications    Prior to Admission medications  Medication Sig Start Date End Date Taking? Authorizing Provider  amiodarone (PACERONE) 200 MG tablet Take 1 tablet (200 mg total) by mouth 2 (two) times daily. 04/20/18   Clegg, Amy D, NP  atorvastatin (LIPITOR) 40 MG tablet Take 1 tablet (40 mg total) by mouth daily. 04/20/18   Clegg, Amy D, NP  carvedilol (COREG) 3.125 MG tablet Take 1 tablet (3.125 mg total) by mouth 2 (two) times daily. 04/20/18   Clegg, Amy D, NP  digoxin (LANOXIN) 0.25 MG tablet Take 1 tablet (0.25 mg total)  by mouth daily. 04/20/18   Clegg, Amy D, NP  hydrALAZINE (APRESOLINE) 50 MG tablet Take 1 tablet (50 mg total) by mouth every 8 (eight) hours. 04/20/18   Clegg, Amy D, NP  isosorbide mononitrate (IMDUR) 60 MG 24 hr tablet Take 1 tablet (60 mg total) by mouth daily. 04/20/18   Clegg, Amy D, NP  losartan (COZAAR) 25 MG tablet Take 1 tablet (25 mg total) by mouth daily. 04/20/18   Clegg, Amy D, NP  oxyCODONE-acetaminophen (PERCOCET/ROXICET) 5-325 MG tablet Take 1 tablet by mouth every 4 (four) hours as needed. 04/29/18   Garlon Hatchet, PA-C  Potassium Chloride ER 20 MEQ TBCR Take 20 mEq by mouth daily. 04/20/18   Clegg, Amy D, NP  sildenafil (VIAGRA) 100 MG tablet Take 1 tablet (100 mg total) by mouth daily as needed for erectile dysfunction. 04/20/18   Clegg, Amy D, NP  spironolactone (ALDACTONE) 25 MG tablet Take 0.5 tablets (12.5 mg total) by mouth daily. 04/20/18   Clegg, Amy D, NP  torsemide (DEMADEX) 20 MG tablet Take 40 mg (2 Tablets) in the AM and 20 mg (1 Tablet) in the PM 04/20/18   Clegg, Amy D, NP  warfarin (COUMADIN) 7.5 MG tablet Take 1 tablet (7.5 mg total) by mouth daily at 6 PM. 04/20/18   Clegg, Amy D, NP    Family History Family History  Problem Relation Age of Onset  . Hypertension Father   . Heart failure Father        paternal side of family   . Hypertension Sister   . Stroke Maternal Grandmother   . Hypertension Unknown        maternal side of family   . Diabetes Neg Hx   . Hyperlipidemia Neg Hx     Social History Social History   Tobacco Use  . Smoking status: Never Smoker  . Smokeless tobacco: Never Used  Substance Use Topics  . Alcohol use: Yes    Alcohol/week: 0.0 standard drinks    Comment: 11/26/2017 "nothing since 07/11/2017"  . Drug use: Never     Allergies   Entresto [sacubitril-valsartan] and Aspirin   Review of Systems Review of Systems  Constitutional: Negative for chills and fever.  Musculoskeletal: Positive for arthralgias and joint swelling.  Skin:  Negative for color change and rash.  Neurological: Negative for weakness and numbness.     Physical Exam Updated Vital Signs BP 130/72   Pulse 84   Temp 99.6 F (37.6 C)   Resp 18   Ht 6' 1.5" (1.867 m)   Wt (!) 220.4 kg   SpO2 97%   BMI 63.25 kg/m   Physical Exam  Constitutional: He appears well-developed and well-nourished. No distress.  HENT:  Head: Normocephalic and atraumatic.  Eyes: Right eye exhibits no discharge. Left eye exhibits no discharge.  Pulmonary/Chest: Effort normal. No respiratory distress.  Musculoskeletal:  Tenderness over the right knee with mild swelling, no overlying redness  or warmth, range of motion limited by pain, ambulatory with crutches  Neurological: He is alert. Coordination normal.  Skin: Skin is warm and dry. Capillary refill takes less than 2 seconds. He is not diaphoretic.  Psychiatric: He has a normal mood and affect. His behavior is normal.  Nursing note and vitals reviewed.    ED Treatments / Results  Labs (all labs ordered are listed, but only abnormal results are displayed) Labs Reviewed - No data to display  EKG None  Radiology No results found.  Procedures Procedures (including critical care time)  Medications Ordered in ED Medications - No data to display   Initial Impression / Assessment and Plan / ED Course  I have reviewed the triage vital signs and the nursing notes.  Pertinent labs & imaging results that were available during my care of the patient were reviewed by me and considered in my medical decision making (see chart for details).  Pt presents with known right quadriceps tendon tear requesting MRI of the right knee, he has been seen by Dr. Linna Caprice with orthopedics and has had outpatient MRI ordered but he is having difficulty getting this completed due to insurance issues.  I discussed with the patient that we are unable to complete nonemergent MRIs from the emergency department and I have recommended that  he contact his orthopedist.  Patient expresses understanding and is in agreement with plan.  Stable for discharge at this time.  Final Clinical Impressions(s) / ED Diagnoses   Final diagnoses:  Rupture of right quadriceps tendon, subsequent encounter    ED Discharge Orders    None       Dartha Lodge, Cordelia Poche 05/20/18 1955    Virgina Norfolk, DO 05/21/18 [phone]

## 2018-05-20 NOTE — ED Triage Notes (Addendum)
Pt wants an MRI of his knee . Pt telling Pa can't get surgery until MRI of knee is done.

## 2018-05-20 NOTE — ED Triage Notes (Signed)
Rt knee pain for one month  He fell down steps one month ago  He has seen an orthopedist who sent him here because the ortho doctor could not get a quick mri ???

## 2018-05-23 ENCOUNTER — Other Ambulatory Visit (HOSPITAL_COMMUNITY): Payer: Self-pay | Admitting: Internal Medicine

## 2018-05-26 MED FILL — CARVEDILOL 3.125 MG TABLET: 3.125 | 34 days supply | Qty: 68 | Fill #0

## 2018-05-26 MED FILL — POTASSIUM CL ER 20 MEQ TAB: 20 | 34 days supply | Qty: 34 | Fill #0

## 2018-05-26 MED FILL — TORSEMIDE 20 MG TABLET: 20 | 33 days supply | Qty: 100 | Fill #0

## 2018-05-26 MED FILL — SPIRONOLACTONE 25 MG TABLET: 25 | 34 days supply | Qty: 17 | Fill #0

## 2018-05-26 MED FILL — WARFARIN SODIUM 7.5 MG TAB: 7.5 | 34 days supply | Qty: 34 | Fill #0

## 2018-05-26 MED FILL — LOSARTAN POTASSIUM 25 MG TA: 25 | 34 days supply | Qty: 34 | Fill #0

## 2018-05-26 MED FILL — hydrALAZINE HCL 50 MG TABS: 50 | 34 days supply | Qty: 102 | Fill #0

## 2018-05-26 MED FILL — ATORVASTATIN 40 MG TABLET: 40 | 34 days supply | Qty: 34 | Fill #0

## 2018-05-26 MED FILL — ISOSORBIDE MN ER 60 MG TAB: 60 | 34 days supply | Qty: 34 | Fill #0

## 2018-05-29 ENCOUNTER — Telehealth (HOSPITAL_COMMUNITY): Payer: Self-pay

## 2018-05-29 NOTE — Telephone Encounter (Signed)
     Request for surgical clearance:  1. What type of surgery is being performed? Right Quad Repair   2. When is this surgery scheduled? "Sometime next week"   3. What type of clearance is required (medical clearance vs. Pharmacy clearance to hold med vs. Both)? Both  4. Are there any medications that need to be held prior to surgery and how long? Warfarin 5-7 days   5. Practice name and name of physician performing surgery? [facility]: Dr. Stann Mainland   6. What is your office phone number? [phone]    7.   What is your office fax number? [phone] Attn: Orson Slick  8.   Anesthesia type (None, local, MAC, general) ? Choice

## 2018-05-29 NOTE — Telephone Encounter (Signed)
Per Dr. Gala Romney, patient has been deemed high risk for procedure due to morbid obesity and low EF. Patient has been cleared to hold warfarin 5-7 days prior to procedure and needs to resume ASAP. No bridge required. Paper clearance has been faxed. Confirmation received that fax was successful. Paper Clearance to be scanned to patient's chart.

## 2018-06-03 NOTE — Pre-Procedure Instructions (Signed)
Mark Baker  06/03/2018      [facility] DRUG STORE #[id number] Mark Baker, [place] - [address] AT [facility] OF [facility] & [address] [place] [address] Phone: [phone] Fax: [phone]  [name] DELIVERY - [place], [place] - [address] [place] [address] Phone: [phone] Fax: [phone]    Your procedure is scheduled on Fri., Oct. 25, 2019 from 12:46PM-3:16PM  Report to [facility] Admitting Entrance "A" at 10:45AM  Call this number if you have problems the morning of surgery:  [phone]   Remember:  Do not eat or drink after midnight on Oct. 24th    Take these medicines the morning of surgery with A SIP OF WATER: Amiodarone (PACERONE), Carvedilol (COREG), Digoxin (LANOXIN), HydrALAZINE (APRESOLINE), and Isosorbide mononitrate (IMDUR)  If needed OxyCODONE-acetaminophen (PERCOCET/ROXICET)   Follow your surgeon's instructions on when to stop Coumadin.  If no instructions were given by your surgeon then you will need to call the office to get those instructions.   As of today, stop taking all Other Aspirin Products, Vitamins, Fish oils, and Herbal medications. Also stop all NSAIDS i.e. Advil, Ibuprofen, Motrin, Aleve, Anaprox, Naproxen, BC, Goody Powders, and all Supplements.     Do not wear jewelry.  Do not wear lotions, powders,colognes, or deodorant.  Do not shave 48 hours prior to surgery.  Men may shave face.  Do not bring valuables to the hospital.  [facility] is not responsible for any belongings or valuables.  Contacts, dentures or bridgework may not be worn into surgery.  Leave your suitcase in the car.  After surgery it may be brought to your room.  For patients admitted to the hospital, discharge time will be determined by your treatment team.  Patients discharged the day of surgery will not be allowed to drive home.   Special instructions:   [name]- Preparing  For Surgery  Before surgery, you can play an important role. Because skin is not sterile, your skin needs to be as free of germs as possible. You can reduce the number of germs on your skin by washing with CHG (chlorahexidine gluconate) Soap before surgery.  CHG is an antiseptic cleaner which kills germs and bonds with the skin to continue killing germs even after washing.    Oral Hygiene is also important to reduce your risk of infection.  Remember - BRUSH YOUR TEETH THE MORNING OF SURGERY WITH YOUR REGULAR TOOTHPASTE  Please do not use if you have an allergy to CHG or antibacterial soaps. If your skin becomes reddened/irritated stop using the CHG.  Do not shave (including legs and underarms) for at least 48 hours prior to first CHG shower. It is OK to shave your face.  Please follow these instructions carefully.   1. Shower the NIGHT BEFORE SURGERY and the MORNING OF SURGERY with CHG.   2. If you chose to wash your hair, wash your hair first as usual with your normal shampoo.  3. After you shampoo, rinse your hair and body thoroughly to remove the shampoo.  4. Use CHG as you would any other liquid soap. You can apply CHG directly to the skin and wash gently with a scrungie or a clean washcloth.   5. Apply the CHG Soap to your body ONLY FROM THE NECK DOWN.  Do not use on open wounds or open sores. Avoid contact with your eyes, ears, mouth  and genitals (private parts). Wash Face and genitals (private parts)  with your normal soap.  6. Wash thoroughly, paying special attention to the area where your surgery will be performed.  7. Thoroughly rinse your body with warm water from the neck down.  8. DO NOT shower/wash with your normal soap after using and rinsing off the CHG Soap.  9. Pat yourself dry with a CLEAN TOWEL.  10. Wear CLEAN PAJAMAS to bed the night before surgery, wear comfortable clothes the morning of surgery  11. Place CLEAN SHEETS on your bed the night of your first shower  and DO NOT SLEEP WITH PETS.  Day of Surgery:  Do not apply any deodorants/lotions.  Please wear clean clothes to the hospital/surgery center.   Remember to brush your teeth WITH YOUR REGULAR TOOTHPASTE.  Please read over the following fact sheets that you were given. Pain Booklet, Coughing and Deep Breathing and Surgical Site Infection Prevention

## 2018-06-04 ENCOUNTER — Inpatient Hospital Stay (HOSPITAL_COMMUNITY)
Admission: RE | Admit: 2018-06-04 | Discharge: 2018-06-04 | Disposition: A | Discharge status: Home / Self Care | Payer: Managed Care, Other (non HMO) | Source: Ambulatory Visit

## 2018-06-04 NOTE — Pre-Procedure Instructions (Addendum)
Mark Baker  06/04/2018      [facility] DRUG STORE #[id number] [name], [place] - [address] AT [facility] OF [facility] & [address] [place] [address] Phone: [phone] Fax: [phone]  [facility] DELIVERY - [place], [place] - [address] [place] [address] Phone: [phone] Fax: [phone]    Your procedure is scheduled on Fri., Oct. 25, 2019 from 12:46PM-3:16PM  Report to [facility] Admitting Entrance "A" at 10:45AM  Call this number if you have problems the morning of surgery:  [phone]   Remember:  Do not eat or drink after midnight on Oct. 24th    Take these medicines the morning of surgery with A SIP OF WATER: Amiodarone (PACERONE), Carvedilol (COREG), Digoxin (LANOXIN), HydrALAZINE (APRESOLINE), and Isosorbide mononitrate (IMDUR)  If needed OxyCODONE-acetaminophen (PERCOCET/ROXICET)   Stop coumadin 5-7 days prior to surgery  As of today, stop taking all Other Aspirin Products, Vitamins, Fish oils, and Herbal medications. Also stop all NSAIDS i.e. Advil, Ibuprofen, Motrin, Aleve, Anaprox, Naproxen, BC, Goody Powders, and all Supplements.     Do not wear jewelry.  Do not wear lotions, powders,colognes, or deodorant.  Do not shave 48 hours prior to surgery.  Men may shave face.  Do not bring valuables to the hospital.  [facility] is not responsible for any belongings or valuables.  Contacts, dentures or bridgework may not be worn into surgery.  Leave your suitcase in the car.  After surgery it may be brought to your room.  For patients admitted to the hospital, discharge time will be determined by your treatment team.  Patients discharged the day of surgery will not be allowed to drive home.   Special instructions:   [place]- Preparing For Surgery  Before surgery, you can play an important role. Because skin is not sterile, your skin needs to be as free of germs as  possible. You can reduce the number of germs on your skin by washing with CHG (chlorahexidine gluconate) Soap before surgery.  CHG is an antiseptic cleaner which kills germs and bonds with the skin to continue killing germs even after washing.    Oral Hygiene is also important to reduce your risk of infection.  Remember - BRUSH YOUR TEETH THE MORNING OF SURGERY WITH YOUR REGULAR TOOTHPASTE  Please do not use if you have an allergy to CHG or antibacterial soaps. If your skin becomes reddened/irritated stop using the CHG.  Do not shave (including legs and underarms) for at least 48 hours prior to first CHG shower. It is OK to shave your face.  Please follow these instructions carefully.   1. Shower the NIGHT BEFORE SURGERY and the MORNING OF SURGERY with CHG.   2. If you chose to wash your hair, wash your hair first as usual with your normal shampoo.  3. After you shampoo, rinse your hair and body thoroughly to remove the shampoo.  4. Use CHG as you would any other liquid soap. You can apply CHG directly to the skin and wash gently with a scrungie or a clean washcloth.   5. Apply the CHG Soap to your body ONLY FROM THE NECK DOWN.  Do not use on open wounds or open sores. Avoid contact with your eyes, ears, mouth and genitals (private parts). Wash Face and genitals (private parts)  with your normal soap.  6. Wash thoroughly, paying special attention to the  area where your surgery will be performed.  7. Thoroughly rinse your body with warm water from the neck down.  8. DO NOT shower/wash with your normal soap after using and rinsing off the CHG Soap.  9. Pat yourself dry with a CLEAN TOWEL.  10. Wear CLEAN PAJAMAS to bed the night before surgery, wear comfortable clothes the morning of surgery  11. Place CLEAN SHEETS on your bed the night of your first shower and DO NOT SLEEP WITH PETS.  Day of Surgery:  Do not apply any deodorants/lotions.  Please wear clean clothes to the  hospital/surgery center.   Remember to brush your teeth WITH YOUR REGULAR TOOTHPASTE.  Please read over the following fact sheets that you were given. Pain Booklet, Coughing and Deep Breathing and Surgical Site Infection Prevention

## 2018-06-04 NOTE — Telephone Encounter (Signed)
DONE

## 2018-06-08 ENCOUNTER — Encounter (HOSPITAL_COMMUNITY): Payer: Managed Care, Other (non HMO)

## 2018-06-09 ENCOUNTER — Other Ambulatory Visit: Payer: Self-pay

## 2018-06-09 ENCOUNTER — Encounter (HOSPITAL_COMMUNITY): Payer: Self-pay | Admitting: *Deleted

## 2018-06-09 MED ORDER — DEXTROSE 5 % IV SOLN
3.0000 g | INTRAVENOUS | Status: AC
  Administered 2018-06-10: 3 g via INTRAVENOUS
  Filled 2018-06-09: qty 3

## 2018-06-09 NOTE — Progress Notes (Signed)
Spoke with pt for pre-op call. Pt has hx of A-fib and nonischemic cardiomyopathy. Cardiologists are Dr. Elberta Fortis and Dr. Kittie Plater. Pt denies any recent chest pain or sob. Pt states he does use a Cpap for his sleep apnea. Pt states his last dose of Coumadin was 05/30/18. Pt states he is not diabetic. See Allison's note.

## 2018-06-09 NOTE — Progress Notes (Signed)
Anesthesia Chart Review: SAME DAY WORK-UP (Patient cancelled his 06/04/18 PAT visit.)  Case:  [id number] Date/Time:  06/10/18 1345   Procedure:  REPAIR QUADRICEP TENDON (Right )   Anesthesia type:  Choice   Pre-op diagnosis:  Right quadriceps rupture   [facility]  [facility] OR ROOM 06 / [facility] OR   Surgeon:  Yolonda Kida, MD      DISCUSSION: Patient is a 37 year old male scheduled for the above procedure. He fell on 04/29/18 and developed right elbow and knee pain. 05/06/18 right knee CT showed high grade partial versus complete tear of distal quadriceps tendon and subtle non-displaced fracture of the posterior periphery of medial femoral condyle.   History includes non-ischemic cardiomyopathy ("likely tachymediated although noncompliance may play a role"), chronic combined systolic and diastolic CHF (EF 75-30%), OSA (CPAP), PAF (diagnosed 10/31/16), syncope 12/21/16 (SR with occasional PVCs on cardiac monitor), HTN, morbid obesity, medical non-compliance, never smoker.   According to 05/29/18 telephone encounter by Daleen Bo, RN, "Per Dr. Gala Romney, patient has been deemed high risk for procedure due to morbid obesity and low EF. Patient has been cleared to hold warfarin 5-7 days prior to procedure and needs to resume ASAP. No bridge required." He is not yet considered a candidate for ICD until he is able to lose weight (weight was 506 lb at his 01/15/18 EP visit.) INR is monitored through his PCP.  Patient is a same day work-up as he cancelled his PAT visit (I left a voice message for [facility] at Dr. Aundria Rud' office regarding this). He does have preoperative HF cardiology input. PAT phone RN to contact patient and inquire about any changes in his history or symptomology. If nothing new reported then he will be assessed on the day of surgery.   He will need labs including PT/INR on the day of surgery.   PROVIDERS: Sherren Mocha, MD is PCP  [name], MD is HF cardiologist. Last visit 04/20/18 with  Tonye Becket, NP. He did not go to his 06/08/18 follow-up visit.  Loman Brooklyn, MD is EP cardiologist. Last visit 01/15/18 for ICD consideration. He wrote, "He is being referred for ICD consideration.  Unfortunately, his weight is a complicating factor.  He weighs 506 pounds today.  The weight limit for our table in the EP lab is at 500 pounds.  He would thus need to lose a significant amount of weight prior to consideration for ICD implantation.  He also has a high risk of complications due to his weight from the procedure."   LABS: He will need updated labs prior to surgery. Most recent labs show: Lab Results  Component Value Date   WBC 6.7 11/25/2017   HGB 15.3 11/25/2017   HCT 45.0 11/25/2017   PLT 203 11/25/2017   GLUCOSE 115 (H) 04/27/2018   ALT 21 12/08/2017   AST 20 12/08/2017   NA 144 04/27/2018   K 3.7 04/27/2018   CL 101 04/27/2018   CREATININE 1.52 (H) 04/27/2018   BUN 14 04/27/2018   CO2 30 04/27/2018   TSH 0.505 11/26/2017   INR 2.5 (H) 12/08/2017   HGBA1C 5.0 11/25/2017    IMAGES: 1V CXR 11/25/17: IMPRESSION: Congestive heart failure with bilateral pulmonary interstitial Edema.   EKG: 12/29/17: NSR, right atrial enlargement. Left ventricular hypertrophy with QRS widening and repolarization abnormality.    CV:  Echo 11/26/17: Study Conclusions - Left ventricle: The cavity size was severely dilated. Wall   thickness was increased in a pattern of  moderate LVH. Systolic   function was severely reduced. The estimated ejection fraction   was in the range of 20% to 25%. Diffuse hypokinesis. There is   akinesis of the inferolateral, inferior, and inferoseptal   myocardium. - Mitral valve: There was mild regurgitation. - Left atrium: The atrium was severely dilated. - Right atrium: The atrium was severely dilated. Impressions: - Compared to the prior study, there has been no significant   interval change.  Cardiac cath 03/18/17:  Mid RCA lesion, 20 %stenosed.   Findings: Ao = 118/88 (97) LV = 132/28 RA = 9 RV = 54/9 PA = 51/20 (34) PCW = 22 Fick cardiac output/index = 9.9/3.2 PVR = 1.2 WU SVR = 708 dynes Ao sat = 88% PA sat = 68%,  Assessment: 1. Minimal non-obstructive CAD 2. Mild pulmonary HTN 3. NICM with EF ~25-30% though hard to assess EF on LV gram due to poor opacification Plan/Discussion: Continue medical therapy. Stressed need for weight loss.   Cardiac monitor 02/03/17-02/22/17: Baseline sample showed SR with PVCs (1 in 1 minute) with HR of 85.6 bpm. There were 0 critical, 0 serious, and 2 stable events that occurred.    Past Medical History:  Diagnosis Date  . Childhood asthma    no exacerbation since age 18   . Chronic combined systolic and diastolic CHF (congestive heart failure) (HCC)    a) ECHO (11/2012): EF 40-45%, RV fx difficult to see, nl size b) ECHO (05/2014): EF 20-25%, grade 2 DD, RV mildly dilated and sys fx mod reduced  . Hypertension   . Migraine    "down to maybe 2/year now; used to have them q other week" (11/26/2017)  . NICM (nonischemic cardiomyopathy) (HCC)    a) (11/2012): normal coronaries  . OSA on CPAP     Past Surgical History:  Procedure Laterality Date  . CARDIOVERSION N/A 11/07/2016   Procedure: CARDIOVERSION;  Surgeon: Dolores Patty, MD;  Location: [facility] OR;  Service: Cardiovascular;  Laterality: N/A;  Will need extra help positioning patient  . KNEE ARTHROSCOPY Right 2003   ANTERIOR CRUCIATE LIGAMENT REPAIR   . LEFT HEART CATHETERIZATION WITH CORONARY ANGIOGRAM N/A 11/24/2012   Procedure: LEFT HEART CATHETERIZATION WITH CORONARY ANGIOGRAM;  Surgeon: Peter M Swaziland, MD;  Location: [facility] CATH LAB;  Service: Cardiovascular;  Laterality: N/A;  . RIGHT/LEFT HEART CATH AND CORONARY ANGIOGRAPHY N/A 03/18/2017   Procedure: Right/Left Heart Cath and Coronary Angiography;  Surgeon: Dolores Patty, MD;  Location: [facility] INVASIVE CV LAB;  Service: Cardiovascular;  Laterality: N/A;  . TEE WITHOUT  CARDIOVERSION N/A 11/07/2016   Procedure: TRANSESOPHAGEAL ECHOCARDIOGRAM (TEE);  Surgeon: Dolores Patty, MD;  Location: [facility] OR;  Service: Cardiovascular;  Laterality: N/A;    MEDICATIONS: No current facility-administered medications for this encounter.    Marland Kitchen amiodarone (PACERONE) 200 MG tablet  . atorvastatin (LIPITOR) 40 MG tablet  . carvedilol (COREG) 3.125 MG tablet  . digoxin (LANOXIN) 0.25 MG tablet  . hydrALAZINE (APRESOLINE) 50 MG tablet  . isosorbide mononitrate (IMDUR) 60 MG 24 hr tablet  . losartan (COZAAR) 25 MG tablet  . oxyCODONE-acetaminophen (PERCOCET/ROXICET) 5-325 MG tablet  . Potassium Chloride ER 20 MEQ TBCR  . sildenafil (VIAGRA) 100 MG tablet  . spironolactone (ALDACTONE) 25 MG tablet  . torsemide (DEMADEX) 20 MG tablet  . warfarin (COUMADIN) 7.5 MG tablet    [facility] Short Stay Center/Anesthesiology Phone [phone] 06/09/2018 10:48 AM

## 2018-06-10 ENCOUNTER — Ambulatory Visit (HOSPITAL_COMMUNITY): Payer: Managed Care, Other (non HMO) | Admitting: Vascular Surgery

## 2018-06-10 ENCOUNTER — Encounter (HOSPITAL_COMMUNITY): Payer: Self-pay

## 2018-06-10 ENCOUNTER — Other Ambulatory Visit: Payer: Self-pay

## 2018-06-10 ENCOUNTER — Observation Stay (HOSPITAL_COMMUNITY)
Admission: RE | Admit: 2018-06-10 | Discharge: 2018-06-11 | Disposition: A | Discharge status: Home / Self Care | Payer: Managed Care, Other (non HMO) | Source: Ambulatory Visit | Attending: Orthopedic Surgery | Admitting: Orthopedic Surgery

## 2018-06-10 ENCOUNTER — Encounter (HOSPITAL_COMMUNITY)
Admission: RE | Disposition: A | Discharge status: Home / Self Care | Payer: Self-pay | Source: Ambulatory Visit | Attending: Orthopedic Surgery

## 2018-06-10 DIAGNOSIS — Z886 Allergy status to analgesic agent status: Secondary | ICD-10-CM | POA: Insufficient documentation

## 2018-06-10 DIAGNOSIS — I5042 Chronic combined systolic (congestive) and diastolic (congestive) heart failure: Secondary | ICD-10-CM | POA: Insufficient documentation

## 2018-06-10 DIAGNOSIS — I272 Pulmonary hypertension, unspecified: Secondary | ICD-10-CM | POA: Insufficient documentation

## 2018-06-10 DIAGNOSIS — Z7901 Long term (current) use of anticoagulants: Secondary | ICD-10-CM | POA: Diagnosis not present

## 2018-06-10 DIAGNOSIS — M238X1 Other internal derangements of right knee: Secondary | ICD-10-CM | POA: Insufficient documentation

## 2018-06-10 DIAGNOSIS — Z79899 Other long term (current) drug therapy: Secondary | ICD-10-CM | POA: Diagnosis not present

## 2018-06-10 DIAGNOSIS — I4891 Unspecified atrial fibrillation: Secondary | ICD-10-CM | POA: Insufficient documentation

## 2018-06-10 DIAGNOSIS — I110 Hypertensive heart disease with heart failure: Secondary | ICD-10-CM | POA: Diagnosis not present

## 2018-06-10 DIAGNOSIS — S76111A Strain of right quadriceps muscle, fascia and tendon, initial encounter: Secondary | ICD-10-CM | POA: Diagnosis not present

## 2018-06-10 DIAGNOSIS — Z8249 Family history of ischemic heart disease and other diseases of the circulatory system: Secondary | ICD-10-CM | POA: Insufficient documentation

## 2018-06-10 DIAGNOSIS — W19XXXA Unspecified fall, initial encounter: Secondary | ICD-10-CM | POA: Insufficient documentation

## 2018-06-10 DIAGNOSIS — Z888 Allergy status to other drugs, medicaments and biological substances status: Secondary | ICD-10-CM | POA: Diagnosis not present

## 2018-06-10 DIAGNOSIS — G4733 Obstructive sleep apnea (adult) (pediatric): Secondary | ICD-10-CM | POA: Diagnosis not present

## 2018-06-10 DIAGNOSIS — Z6841 Body Mass Index (BMI) 40.0 and over, adult: Secondary | ICD-10-CM | POA: Insufficient documentation

## 2018-06-10 DIAGNOSIS — G43909 Migraine, unspecified, not intractable, without status migrainosus: Secondary | ICD-10-CM | POA: Diagnosis not present

## 2018-06-10 DIAGNOSIS — Z823 Family history of stroke: Secondary | ICD-10-CM | POA: Diagnosis not present

## 2018-06-10 DIAGNOSIS — I2510 Atherosclerotic heart disease of native coronary artery without angina pectoris: Secondary | ICD-10-CM | POA: Diagnosis not present

## 2018-06-10 DIAGNOSIS — I428 Other cardiomyopathies: Secondary | ICD-10-CM | POA: Diagnosis not present

## 2018-06-10 HISTORY — PX: QUADRICEPS TENDON REPAIR: SHX756

## 2018-06-10 LAB — CBC
HCT: 44.8 % (ref 39.0–52.0)
HEMOGLOBIN: 12.8 g/dL — AB (ref 13.0–17.0)
MCH: 26.9 pg (ref 26.0–34.0)
MCHC: 28.6 g/dL — ABNORMAL LOW (ref 30.0–36.0)
MCV: 94.3 fL (ref 80.0–100.0)
NRBC: 0 % (ref 0.0–0.2)
Platelets: 225 10*3/uL (ref 150–400)
RBC: 4.75 MIL/uL (ref 4.22–5.81)
RDW: 13.7 % (ref 11.5–15.5)
WBC: 6.5 10*3/uL (ref 4.0–10.5)

## 2018-06-10 LAB — BASIC METABOLIC PANEL
ANION GAP: 9 (ref 5–15)
BUN: 16 mg/dL (ref 6–20)
CALCIUM: 8.6 mg/dL — AB (ref 8.9–10.3)
CO2: 28 mmol/L (ref 22–32)
Chloride: 103 mmol/L (ref 98–111)
Creatinine, Ser: 1.44 mg/dL — ABNORMAL HIGH (ref 0.61–1.24)
GFR calc non Af Amer: >60 mL/min (ref >60)
Glucose, Bld: 103 mg/dL — ABNORMAL HIGH (ref 70–99)
POTASSIUM: 3.3 mmol/L — AB (ref 3.5–5.1)
Sodium: 140 mmol/L (ref 135–145)

## 2018-06-10 LAB — PROTIME-INR
INR: 1.16
Prothrombin Time: 14.7 seconds (ref 11.4–15.2)

## 2018-06-10 SURGERY — REPAIR, TENDON, QUADRICEPS
Anesthesia: Choice | Laterality: Right

## 2018-06-10 MED ORDER — TORSEMIDE 20 MG PO TABS: 20.0000 mg | ORAL_TABLET | ORAL | Status: DC

## 2018-06-10 MED ORDER — DIGOXIN 125 MCG PO TABS
0.2500 mg | ORAL_TABLET | Freq: Every day | ORAL | Status: DC
  Administered 2018-06-10 – 2018-06-11 (×2): 0.25 mg via ORAL
  Filled 2018-06-10 (×2): qty 2

## 2018-06-10 MED ORDER — ISOSORBIDE MONONITRATE ER 60 MG PO TB24
60.0000 mg | ORAL_TABLET | Freq: Every day | ORAL | Status: DC
  Administered 2018-06-11: 60 mg via ORAL
  Filled 2018-06-10: qty 1

## 2018-06-10 MED ORDER — METHOCARBAMOL 500 MG PO TABS
ORAL_TABLET | ORAL | Status: AC
  Filled 2018-06-10: qty 1

## 2018-06-10 MED ORDER — DEXAMETHASONE SODIUM PHOSPHATE 10 MG/ML IJ SOLN
INTRAMUSCULAR | Status: AC
  Filled 2018-06-10: qty 1

## 2018-06-10 MED ORDER — HYDROMORPHONE HCL 1 MG/ML IJ SOLN
0.2500 mg | INTRAMUSCULAR | Status: DC | PRN
  Administered 2018-06-10 (×2): 0.5 mg via INTRAVENOUS

## 2018-06-10 MED ORDER — WARFARIN - PHYSICIAN DOSING INPATIENT: Freq: Every day | Status: DC

## 2018-06-10 MED ORDER — ONDANSETRON HCL 4 MG/2ML IJ SOLN
INTRAMUSCULAR | Status: DC | PRN
  Administered 2018-06-10: 4 mg via INTRAVENOUS

## 2018-06-10 MED ORDER — TORSEMIDE 20 MG PO TABS
40.0000 mg | ORAL_TABLET | Freq: Every day | ORAL | Status: DC
  Administered 2018-06-11: 40 mg via ORAL
  Filled 2018-06-10: qty 2

## 2018-06-10 MED ORDER — ROCURONIUM BROMIDE 50 MG/5ML IV SOSY
PREFILLED_SYRINGE | INTRAVENOUS | Status: AC
  Filled 2018-06-10: qty 5

## 2018-06-10 MED ORDER — PHENYLEPHRINE HCL 10 MG/ML IJ SOLN
INTRAMUSCULAR | Status: DC | PRN
  Administered 2018-06-10: 80 ug via INTRAVENOUS
  Administered 2018-06-10: 10 ug via INTRAVENOUS
  Administered 2018-06-10: 80 ug via INTRAVENOUS
  Administered 2018-06-10: 5 ug via INTRAVENOUS

## 2018-06-10 MED ORDER — ATORVASTATIN CALCIUM 40 MG PO TABS
40.0000 mg | ORAL_TABLET | Freq: Every day | ORAL | Status: DC
  Administered 2018-06-10 – 2018-06-11 (×2): 40 mg via ORAL
  Filled 2018-06-10 (×2): qty 1

## 2018-06-10 MED ORDER — OXYCODONE HCL 5 MG PO TABS
ORAL_TABLET | ORAL | Status: AC
  Administered 2018-06-10: 10 mg via ORAL
  Filled 2018-06-10: qty 2

## 2018-06-10 MED ORDER — LIDOCAINE 2% (20 MG/ML) 5 ML SYRINGE
INTRAMUSCULAR | Status: AC
  Filled 2018-06-10: qty 5

## 2018-06-10 MED ORDER — SUGAMMADEX SODIUM 500 MG/5ML IV SOLN
INTRAVENOUS | Status: AC
  Filled 2018-06-10: qty 5

## 2018-06-10 MED ORDER — FENTANYL CITRATE (PF) 100 MCG/2ML IJ SOLN
INTRAMUSCULAR | Status: DC | PRN
  Administered 2018-06-10: 100 ug via INTRAVENOUS

## 2018-06-10 MED ORDER — LACTATED RINGERS IV SOLN
INTRAVENOUS | Status: DC
  Administered 2018-06-10: 13:00:00 via INTRAVENOUS

## 2018-06-10 MED ORDER — MIDAZOLAM HCL 5 MG/5ML IJ SOLN
INTRAMUSCULAR | Status: DC | PRN
  Administered 2018-06-10: 2 mg via INTRAVENOUS

## 2018-06-10 MED ORDER — TORSEMIDE 20 MG PO TABS
20.0000 mg | ORAL_TABLET | Freq: Every day | ORAL | Status: DC
  Administered 2018-06-10: 20 mg via ORAL
  Filled 2018-06-10 (×2): qty 1

## 2018-06-10 MED ORDER — WARFARIN SODIUM 7.5 MG PO TABS: 7.5000 mg | ORAL_TABLET | ORAL | Status: DC

## 2018-06-10 MED ORDER — VASOPRESSIN 20 UNIT/ML IV SOLN
INTRAVENOUS | Status: AC
  Filled 2018-06-10: qty 1

## 2018-06-10 MED ORDER — PROPOFOL 10 MG/ML IV BOLUS
INTRAVENOUS | Status: DC | PRN
  Administered 2018-06-10: 140 mg via INTRAVENOUS

## 2018-06-10 MED ORDER — BUPIVACAINE HCL (PF) 0.25 % IJ SOLN
INTRAMUSCULAR | Status: DC | PRN
  Administered 2018-06-10: 20 mL

## 2018-06-10 MED ORDER — SPIRONOLACTONE 12.5 MG HALF TABLET
12.5000 mg | ORAL_TABLET | Freq: Every day | ORAL | Status: DC
  Administered 2018-06-10 – 2018-06-11 (×2): 12.5 mg via ORAL
  Filled 2018-06-10 (×2): qty 1

## 2018-06-10 MED ORDER — WARFARIN SODIUM 2.5 MG PO TABS
3.7500 mg | ORAL_TABLET | ORAL | Status: DC
  Administered 2018-06-11: 3.75 mg via ORAL
  Filled 2018-06-10: qty 1

## 2018-06-10 MED ORDER — ONDANSETRON HCL 4 MG/2ML IJ SOLN
INTRAMUSCULAR | Status: AC
  Filled 2018-06-10: qty 2

## 2018-06-10 MED ORDER — PHENYLEPHRINE 40 MCG/ML (10ML) SYRINGE FOR IV PUSH (FOR BLOOD PRESSURE SUPPORT)
PREFILLED_SYRINGE | INTRAVENOUS | Status: AC
  Filled 2018-06-10: qty 10

## 2018-06-10 MED ORDER — ROCURONIUM BROMIDE 50 MG/5ML IV SOSY
PREFILLED_SYRINGE | INTRAVENOUS | Status: DC | PRN
  Administered 2018-06-10: 100 mg via INTRAVENOUS
  Administered 2018-06-10: 20 mg via INTRAVENOUS

## 2018-06-10 MED ORDER — BUPIVACAINE HCL (PF) 0.25 % IJ SOLN
INTRAMUSCULAR | Status: AC
  Filled 2018-06-10: qty 30

## 2018-06-10 MED ORDER — OXYCODONE HCL 5 MG PO TABS
5.0000 mg | ORAL_TABLET | ORAL | Status: DC | PRN
  Administered 2018-06-10 – 2018-06-11 (×2): 10 mg via ORAL
  Filled 2018-06-10: qty 2

## 2018-06-10 MED ORDER — CARVEDILOL 3.125 MG PO TABS
3.1250 mg | ORAL_TABLET | Freq: Two times a day (BID) | ORAL | Status: DC
  Administered 2018-06-10 – 2018-06-11 (×2): 3.125 mg via ORAL
  Filled 2018-06-10 (×2): qty 1

## 2018-06-10 MED ORDER — ETOMIDATE 2 MG/ML IV SOLN
INTRAVENOUS | Status: DC | PRN
  Administered 2018-06-10: 12 mg via INTRAVENOUS

## 2018-06-10 MED ORDER — MORPHINE SULFATE (PF) 2 MG/ML IV SOLN: 2.0000 mg | INTRAVENOUS | Status: DC | PRN

## 2018-06-10 MED ORDER — ACETAMINOPHEN 325 MG PO TABS: 650.0000 mg | ORAL_TABLET | Freq: Four times a day (QID) | ORAL | Status: DC | PRN

## 2018-06-10 MED ORDER — CHLORHEXIDINE GLUCONATE 4 % EX LIQD: 60.0000 mL | Freq: Once | CUTANEOUS | Status: DC

## 2018-06-10 MED ORDER — ENOXAPARIN SODIUM 40 MG/0.4ML ~~LOC~~ SOLN
40.0000 mg | SUBCUTANEOUS | Status: AC
  Administered 2018-06-11: 40 mg via SUBCUTANEOUS
  Filled 2018-06-10: qty 0.4

## 2018-06-10 MED ORDER — AMIODARONE HCL 100 MG PO TABS
200.0000 mg | ORAL_TABLET | Freq: Two times a day (BID) | ORAL | Status: DC
  Administered 2018-06-10 – 2018-06-11 (×2): 200 mg via ORAL
  Filled 2018-06-10 (×2): qty 2

## 2018-06-10 MED ORDER — ACETAMINOPHEN 650 MG RE SUPP: 650.0000 mg | Freq: Four times a day (QID) | RECTAL | Status: DC | PRN

## 2018-06-10 MED ORDER — SODIUM CHLORIDE 0.9 % IV SOLN
INTRAVENOUS | Status: DC | PRN
  Administered 2018-06-10: 35 ug/min via INTRAVENOUS

## 2018-06-10 MED ORDER — WARFARIN SODIUM 7.5 MG PO TABS
7.5000 mg | ORAL_TABLET | ORAL | Status: DC
  Administered 2018-06-10: 7.5 mg via ORAL
  Filled 2018-06-10: qty 1

## 2018-06-10 MED ORDER — METHOCARBAMOL 1000 MG/10ML IJ SOLN
500.0000 mg | Freq: Four times a day (QID) | INTRAVENOUS | Status: DC | PRN
  Filled 2018-06-10: qty 5

## 2018-06-10 MED ORDER — POLYETHYLENE GLYCOL 3350 17 G PO PACK: 17.0000 g | PACK | Freq: Every day | ORAL | Status: DC | PRN

## 2018-06-10 MED ORDER — LIDOCAINE 2% (20 MG/ML) 5 ML SYRINGE
INTRAMUSCULAR | Status: DC | PRN
  Administered 2018-06-10: 100 mg via INTRAVENOUS

## 2018-06-10 MED ORDER — EPHEDRINE 5 MG/ML INJ
INTRAVENOUS | Status: AC
  Filled 2018-06-10: qty 10

## 2018-06-10 MED ORDER — POTASSIUM CHLORIDE CRYS ER 20 MEQ PO TBCR
20.0000 meq | EXTENDED_RELEASE_TABLET | Freq: Every day | ORAL | Status: DC
  Administered 2018-06-10 – 2018-06-11 (×2): 20 meq via ORAL
  Filled 2018-06-10 (×2): qty 1

## 2018-06-10 MED ORDER — METHOCARBAMOL 500 MG PO TABS
500.0000 mg | ORAL_TABLET | Freq: Four times a day (QID) | ORAL | Status: DC | PRN
  Administered 2018-06-10: 500 mg via ORAL

## 2018-06-10 MED ORDER — ACETAMINOPHEN 500 MG PO TABS
1000.0000 mg | ORAL_TABLET | Freq: Four times a day (QID) | ORAL | Status: DC
  Administered 2018-06-11 (×4): 1000 mg via ORAL
  Filled 2018-06-10 (×4): qty 2

## 2018-06-10 MED ORDER — FENTANYL CITRATE (PF) 250 MCG/5ML IJ SOLN
INTRAMUSCULAR | Status: AC
  Filled 2018-06-10: qty 5

## 2018-06-10 MED ORDER — MIDAZOLAM HCL 2 MG/2ML IJ SOLN
INTRAMUSCULAR | Status: AC
  Filled 2018-06-10: qty 2

## 2018-06-10 MED ORDER — DEXAMETHASONE SODIUM PHOSPHATE 10 MG/ML IJ SOLN
INTRAMUSCULAR | Status: DC | PRN
  Administered 2018-06-10: 10 mg via INTRAVENOUS

## 2018-06-10 MED ORDER — HYDROMORPHONE HCL 1 MG/ML IJ SOLN
INTRAMUSCULAR | Status: AC
  Administered 2018-06-10: 0.5 mg via INTRAVENOUS
  Filled 2018-06-10: qty 1

## 2018-06-10 MED ORDER — SUGAMMADEX SODIUM 500 MG/5ML IV SOLN
INTRAVENOUS | Status: DC | PRN
  Administered 2018-06-10: 500 mg via INTRAVENOUS

## 2018-06-10 MED ORDER — 0.9 % SODIUM CHLORIDE (POUR BTL) OPTIME
TOPICAL | Status: DC | PRN
  Administered 2018-06-10: 1000 mL

## 2018-06-10 MED ORDER — HYDRALAZINE HCL 50 MG PO TABS
50.0000 mg | ORAL_TABLET | Freq: Three times a day (TID) | ORAL | Status: DC
  Administered 2018-06-10 – 2018-06-11 (×3): 50 mg via ORAL
  Filled 2018-06-10 (×3): qty 1

## 2018-06-10 MED ORDER — LOSARTAN POTASSIUM 25 MG PO TABS
25.0000 mg | ORAL_TABLET | Freq: Every evening | ORAL | Status: DC
  Administered 2018-06-10 – 2018-06-11 (×2): 25 mg via ORAL
  Filled 2018-06-10 (×2): qty 1

## 2018-06-10 SURGICAL SUPPLY — 63 items
ALCOHOL 70% 16 OZ (MISCELLANEOUS) ×3 IMPLANT
ANCHOR PEEK 4.75X19.1 SWLK C (Anchor) ×3 IMPLANT
BANDAGE ACE 4X5 VEL STRL LF (GAUZE/BANDAGES/DRESSINGS) ×3 IMPLANT
BANDAGE ACE 6X5 VEL STRL LF (GAUZE/BANDAGES/DRESSINGS) IMPLANT
BNDG COHESIVE 4X5 TAN STRL (GAUZE/BANDAGES/DRESSINGS) IMPLANT
BNDG COHESIVE 6X5 TAN STRL LF (GAUZE/BANDAGES/DRESSINGS) ×3 IMPLANT
BNDG ELASTIC 6X10 VLCR STRL LF (GAUZE/BANDAGES/DRESSINGS) ×3 IMPLANT
COVER SURGICAL LIGHT HANDLE (MISCELLANEOUS) ×3 IMPLANT
COVER WAND RF STERILE (DRAPES) ×3 IMPLANT
CUFF TOURNIQUET SINGLE 34IN LL (TOURNIQUET CUFF) IMPLANT
CUFF TOURNIQUET SINGLE 44IN (TOURNIQUET CUFF) IMPLANT
DRAPE C-ARM 42X72 X-RAY (DRAPES) IMPLANT
DRAPE C-ARMOR (DRAPES) IMPLANT
DRAPE IMP U-DRAPE 54X76 (DRAPES) ×3 IMPLANT
DRAPE INCISE IOBAN 66X45 STRL (DRAPES) ×3 IMPLANT
DRAPE U-SHAPE 47X51 STRL (DRAPES) IMPLANT
DURAPREP 26ML APPLICATOR (WOUND CARE) ×3 IMPLANT
ELECT CAUTERY BLADE 6.4 (BLADE) ×3 IMPLANT
ELECT REM PT RETURN 9FT ADLT (ELECTROSURGICAL) ×3
ELECTRODE REM PT RTRN 9FT ADLT (ELECTROSURGICAL) ×1 IMPLANT
GAUZE SPONGE 4X4 12PLY STRL (GAUZE/BANDAGES/DRESSINGS) ×3 IMPLANT
GAUZE XEROFORM 5X9 LF (GAUZE/BANDAGES/DRESSINGS) ×3 IMPLANT
GLOVE BIO SURGEON STRL SZ7.5 (GLOVE) ×3 IMPLANT
GLOVE BIOGEL PI IND STRL 8 (GLOVE) ×2 IMPLANT
GLOVE BIOGEL PI INDICATOR 8 (GLOVE) ×4
GOWN STRL REUS W/ TWL LRG LVL3 (GOWN DISPOSABLE) ×1 IMPLANT
GOWN STRL REUS W/ TWL XL LVL3 (GOWN DISPOSABLE) ×1 IMPLANT
GOWN STRL REUS W/TWL LRG LVL3 (GOWN DISPOSABLE) ×2
GOWN STRL REUS W/TWL XL LVL3 (GOWN DISPOSABLE) ×2
KIT BASIN OR (CUSTOM PROCEDURE TRAY) ×3 IMPLANT
KIT TURNOVER KIT B (KITS) ×3 IMPLANT
NEEDLE HYPO 25GX1X1/2 BEV (NEEDLE) IMPLANT
NS IRRIG 1000ML POUR BTL (IV SOLUTION) ×3 IMPLANT
PACK ORTHO EXTREMITY (CUSTOM PROCEDURE TRAY) ×3 IMPLANT
PAD ARMBOARD 7.5X6 YLW CONV (MISCELLANEOUS) ×6 IMPLANT
PAD CAST 3X4 CTTN HI CHSV (CAST SUPPLIES) IMPLANT
PAD CAST 4YDX4 CTTN HI CHSV (CAST SUPPLIES) ×1 IMPLANT
PADDING CAST COTTON 3X4 STRL (CAST SUPPLIES)
PADDING CAST COTTON 4X4 STRL (CAST SUPPLIES) ×2
PADDING CAST COTTON 6X4 STRL (CAST SUPPLIES) ×3 IMPLANT
PASSER SUT SWANSON 36MM LOOP (INSTRUMENTS) IMPLANT
SPONGE LAP 18X18 X RAY DECT (DISPOSABLE) ×3 IMPLANT
STOCKINETTE IMPERVIOUS LG (DRAPES) ×3 IMPLANT
SUCTION FRAZIER HANDLE 10FR (MISCELLANEOUS) ×2
SUCTION TUBE FRAZIER 10FR DISP (MISCELLANEOUS) ×1 IMPLANT
SUT ETHIBOND 5 LR DA (SUTURE) ×9 IMPLANT
SUT ETHILON 2 0 FS 18 (SUTURE) IMPLANT
SUT ETHILON 3 0 PS 1 (SUTURE) IMPLANT
SUT FIBERWIRE #2 38 T-5 BLUE (SUTURE)
SUT VIC AB 0 CT1 27 (SUTURE)
SUT VIC AB 0 CT1 27XBRD ANBCTR (SUTURE) IMPLANT
SUT VIC AB 2-0 CT1 27 (SUTURE)
SUT VIC AB 2-0 CT1 TAPERPNT 27 (SUTURE) IMPLANT
SUTURE FIBERWR #2 38 T-5 BLUE (SUTURE) IMPLANT
SYR CONTROL 10ML LL (SYRINGE) IMPLANT
SYS INTERNAL BRACE KNEE (Miscellaneous) ×3 IMPLANT
SYSTEM INTERNAL BRACE KNEE (Miscellaneous) ×1 IMPLANT
TOWEL OR 17X24 6PK STRL BLUE (TOWEL DISPOSABLE) ×3 IMPLANT
TOWEL OR 17X26 10 PK STRL BLUE (TOWEL DISPOSABLE) ×6 IMPLANT
TUBE CONNECTING 12'X1/4 (SUCTIONS) ×1
TUBE CONNECTING 12X1/4 (SUCTIONS) ×2 IMPLANT
UNDERPAD 30X30 (UNDERPADS AND DIAPERS) ×3 IMPLANT
WATER STERILE IRR 1000ML POUR (IV SOLUTION) ×3 IMPLANT

## 2018-06-10 NOTE — Anesthesia Procedure Notes (Signed)
Procedure Name: Intubation Date/Time: 06/10/2018 2:18 PM Performed by: Page Spiro, CRNA Pre-anesthesia Checklist: Patient identified, Suction available, Emergency Drugs available, Patient being monitored and Timeout performed Patient Re-evaluated:Patient Re-evaluated prior to induction Oxygen Delivery Method: Circle system utilized Preoxygenation: Pre-oxygenation with 100% oxygen Induction Type: IV induction Ventilation: Two handed mask ventilation required and Oral airway inserted - appropriate to patient size Laryngoscope Size: Glidescope and 4 Grade View: Grade I Tube type: Oral Tube size: 7.5 mm Number of attempts: 1 Airway Equipment and Method: Video-laryngoscopy Secured at: 22 cm Difficulty Due To: Difficulty was anticipated

## 2018-06-10 NOTE — Anesthesia Preprocedure Evaluation (Signed)
Anesthesia Evaluation  Patient identified by MRN, date of birth, ID band Patient awake    Reviewed: Allergy & Precautions, NPO status , Patient's Chart, lab work & pertinent test results  Airway Mallampati: II  TM Distance: >3 FB Neck ROM: Full    Dental no notable dental hx.    Pulmonary sleep apnea and Continuous Positive Airway Pressure Ventilation ,    Pulmonary exam normal breath sounds clear to auscultation       Cardiovascular hypertension, Pt. on medications +CHF (EF 20%)  Normal cardiovascular exam+ dysrhythmias Atrial Fibrillation  Rhythm:Regular Rate:Normal     Neuro/Psych negative neurological ROS  negative psych ROS   GI/Hepatic negative GI ROS, Neg liver ROS,   Endo/Other  Morbid obesity  Renal/GU negative Renal ROS  negative genitourinary   Musculoskeletal negative musculoskeletal ROS (+)   Abdominal   Peds negative pediatric ROS (+)  Hematology negative hematology ROS (+)   Anesthesia Other Findings   Reproductive/Obstetrics negative OB ROS                             Anesthesia Physical Anesthesia Plan  ASA: IV  Anesthesia Plan: General   Post-op Pain Management:    Induction: Intravenous  PONV Risk Score and Plan: 2 and Ondansetron and Treatment may vary due to age or medical condition  Airway Management Planned: Oral ETT  Additional Equipment:   Intra-op Plan:   Post-operative Plan: Extubation in OR  Informed Consent: I have reviewed the patients History and Physical, chart, labs and discussed the procedure including the risks, benefits and alternatives for the proposed anesthesia with the patient or authorized representative who has indicated his/her understanding and acceptance.   Dental advisory given  Plan Discussed with: CRNA  Anesthesia Plan Comments: (High risk for cardiac and anesthesia complications explained to patient.)         Anesthesia Quick Evaluation

## 2018-06-10 NOTE — Progress Notes (Signed)
Pt is wanting to go to sleep and wears CPAP at home but did not bring his. Respiratory called and will meet [place] in pt room to have CPAP placed so pt can sleep.

## 2018-06-10 NOTE — Op Note (Signed)
06/10/2018  4:11 PM  PATIENT:  Mark Baker    PRE-OPERATIVE DIAGNOSIS:  Right quadriceps rupture  POST-OPERATIVE DIAGNOSIS:  Same  PROCEDURE:  REPAIR QUADRICEP TENDON  SURGEON:  Yolonda Kida, MD  PHYSICIAN ASSISTANT:  Hart Carwin, RNFA April Green, RNFA  ANESTHESIA:   General  PREOPERATIVE INDICATIONS:  Mark Baker is a  37 y.o. male with a diagnosis of Right quadriceps rupture who elected for surgical management in order to restore the function of the extensor mechanism.    The risks benefits and alternatives were discussed with the patient preoperatively including but not limited to the risks of infection, bleeding, nerve injury, cardiopulmonary complications, the need for revision surgery, hardware prominence, hardware failure, the need for hardware removal, nonunion, malunion, posttraumatic arthritis, stiffness, loss of strength and function, among others, and the patient was willing to proceed.  We did send patient for preoperative risk stratification with his cardiologist given his history of congestive heart failure and atrial fibrillation on chronic anticoagulation.  He was cleared with moderate risk of intraoperative cardiac event.  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  Extensive adhesions between the extensor mechanism and the subcutaneous fat.  There was a short less than 1 cm stump remaining on the superior pole of the patella.  OPERATIVE PROCEDURE: The patient was brought to the operating room and placed in the supine position. General anesthesia was administered. IV antibiotics were given. The lower extremity was prepped and draped in usual sterile fashion.  Given patient's large body habitus a tourniquet was not able to be used.  Time out was  performed.   Anterior incision was made over the patella and quadriceps tendon.  Dissection was carried down to the layer of the prepatellar retinaculum.  Proximal dissection was carried forth to the level of the tear.  The tendon was noted to be completely ruptured and retracted about 5 cm proximally.  There were extensive adhesions noted between the sensor mechanism and the subcutaneous fat.  The retinaculum was torn on either side.  Once the adhesions were released I then utilized to Arthrex suture tape sutures to run a Krakw running locking suture figuration on the medial half up and down to the tendon stump.  I then repeated this on the lateral half running a Krakw style suture up and down.  This left knee with 4 suture limbs.  Next we prepared the superior pole of the patella.  We debrided the stump from the superior pole.  All devitalized tissue was removed.  We next used rondure your to biologically prepare the superior pole bone.  Next we moved to secure the suture limbs into the superior pole of the patella.  We prepared for the placement of 2 parallel 4.75 mm Arthrex swivel lock anchors.  Drill pins were placed.  We then overdrilled with the appropriate cannulated drill bit to a depth of 20 mm.  We next used the hand tap to prepare the drill sockets.  The lateral one was noted to be quite hard and we elected to use a peek anchor and this more dense bone.  The lateral 2 suture limbs were loaded into the lateral swivel lock and the medial 2 sutures were loaded into the medial swivel lock.  These were secured into the bone and had excellent purchase.  Next to the free limbs of the suture tapes as well as the free limbs of the suture anchor #2 FiberWire were then ran back into the tendon in a horizontal mattress configuration and tied down to completely secure the tendon to bone interface.  This was repeated on both the medial and lateral side.  We then moved to close the retinacular tissue.   This was performed with a #2 FiberWire and a running locking fashion.  Following complete closure of the extensor mechanism and the retinaculum I was able to passively flex the knee to about 30 degrees with no gapping noted at the tendon bone interface.  The wounds were irrigated copiously.  The deep fat layer was closed with a running 0 Vicryl.  Deep dermal tissue was closed with interrupted 2-0 Vicryl.  Skin was reapproximated and closed with staples.  The wounds were also injected.  Standard sterile bandages were applied.  Patient was placed in a Bledsoe style hinged knee brace locked in full extension. The patient was awakened and returned to the PACU in stable and satisfactory condition. There were no complications.    Disposition:  Patient was transferred to PACU in stable condition.  He will be 50% weightbearing to the right lower extremity with the leg locked in full extension.  He will be placed in observation given his complex medical morbidities to assure that he is stable overnight.  We will likely discharge him home tomorrow.  He will follow-up in my office in 2 to 3 weeks for wound check and staple removal.

## 2018-06-10 NOTE — H&P (Signed)
ORTHOPAEDIC H and P  REQUESTING PHYSICIAN: Yolonda Kida, MD  PCP:  Sherren Mocha, MD  Chief Complaint: Right quadriceps tendon rupture.  HPI: Mark Baker is a 37 y.o. male who complains of right knee pain following a fall last month.  He was noted on CT scan to have a quadriceps tendon rupture of the right knee.  He presents today for surgical repair.  He is somewhat high risk due to congestive heart failure and being on chronic anticoagulation for recent atrial fibrillation.  He has been cleared with a moderately high risk by his cardiologist.  He understands at risk and presents today for the surgery to restore his ability to weight-bear on the right leg and walk.  Past Medical History:  Diagnosis Date  . Childhood asthma    no exacerbation since age 40   . Chronic combined systolic and diastolic CHF (congestive heart failure) (HCC)    a) ECHO (11/2012): EF 40-45%, RV fx difficult to see, nl size b) ECHO (05/2014): EF 20-25%, grade 2 DD, RV mildly dilated and sys fx mod reduced  . Hypertension   . Migraine    "down to maybe 2/year now; used to have them q other week" (11/26/2017)  . NICM (nonischemic cardiomyopathy) (HCC)    a) (11/2012): normal coronaries  . OSA on CPAP    Past Surgical History:  Procedure Laterality Date  . CARDIOVERSION N/A 11/07/2016   Procedure: CARDIOVERSION;  Surgeon: Dolores Patty, MD;  Location: [facility] OR;  Service: Cardiovascular;  Laterality: N/A;  Will need extra help positioning patient  . KNEE ARTHROSCOPY Right 2003   ANTERIOR CRUCIATE LIGAMENT REPAIR   . LEFT HEART CATHETERIZATION WITH CORONARY ANGIOGRAM N/A 11/24/2012   Procedure: LEFT HEART CATHETERIZATION WITH CORONARY ANGIOGRAM;  Surgeon: Peter M Swaziland, MD;  Location: [facility] CATH LAB;  Service: Cardiovascular;  Laterality: N/A;  . RIGHT/LEFT HEART CATH AND CORONARY ANGIOGRAPHY N/A 03/18/2017   Procedure: Right/Left Heart Cath and Coronary Angiography;  Surgeon: Dolores Patty, MD;   Location: [facility] INVASIVE CV LAB;  Service: Cardiovascular;  Laterality: N/A;  . TEE WITHOUT CARDIOVERSION N/A 11/07/2016   Procedure: TRANSESOPHAGEAL ECHOCARDIOGRAM (TEE);  Surgeon: Dolores Patty, MD;  Location: [facility] OR;  Service: Cardiovascular;  Laterality: N/A;   Social History   Socioeconomic History  . Marital status: Married    Spouse name: Not on file  . Number of children: Not on file  . Years of education: Not on file  . Highest education level: Not on file  Occupational History  . Not on file  Social Needs  . Financial resource strain: Not on file  . Food insecurity:    Worry: Not on file    Inability: Not on file  . Transportation needs:    Medical: Not on file    Non-medical: Not on file  Tobacco Use  . Smoking status: Never Smoker  . Smokeless tobacco: Never Used  Substance and Sexual Activity  . Alcohol use: Not Currently    Alcohol/week: 0.0 standard drinks    Comment: 11/26/2017 "nothing since 07/11/2017"  . Drug use: Never  . 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   Lifestyle  . Physical activity:    Days per week: Not on file    Minutes per session: Not on file  . Stress: Not on file  Relationships  . Social connections:    Talks on phone: Not on file    Gets together:  Not on file    Attends religious service: Not on file    Active member of club or organization: Not on file    Attends meetings of clubs or organizations: Not on file    Relationship status: Not on file  Other Topics Concern  . Not on file  Social History Narrative   Lives in [place] with wife of 4 years.  Works for [employer] at [employer].  1 son and 2 daughters.     Denies cigarettes. Drank 1 beer last night 11/22/12. Denies drugs          Family History  Problem Relation Age of Onset  . Hypertension Father   . Heart failure Father        paternal side of family   . Hypertension Sister   . Stroke Maternal Grandmother   . Hypertension Unknown         maternal side of family   . Diabetes Neg Hx   . Hyperlipidemia Neg Hx    Allergies  Allergen Reactions  . Aspirin Hives, Itching and Rash  . Entresto [Sacubitril-Valsartan] Cough    Was on Entresto 07/2016-09/2016   Prior to Admission medications   Medication Sig Start Date End Date Taking? Authorizing Provider  amiodarone (PACERONE) 200 MG tablet Take 1 tablet (200 mg total) by mouth 2 (two) times daily. 04/20/18  Yes Clegg, Amy D, NP  atorvastatin (LIPITOR) 40 MG tablet Take 1 tablet (40 mg total) by mouth daily. 04/20/18  Yes Clegg, Amy D, NP  carvedilol (COREG) 3.125 MG tablet Take 1 tablet (3.125 mg total) by mouth 2 (two) times daily. 04/20/18  Yes Clegg, Amy D, NP  digoxin (LANOXIN) 0.25 MG tablet Take 1 tablet (0.25 mg total) by mouth daily. 04/20/18  Yes Clegg, Amy D, NP  hydrALAZINE (APRESOLINE) 50 MG tablet Take 1 tablet (50 mg total) by mouth every 8 (eight) hours. 04/20/18  Yes Clegg, Amy D, NP  isosorbide mononitrate (IMDUR) 60 MG 24 hr tablet Take 1 tablet (60 mg total) by mouth daily. 04/20/18  Yes Clegg, Amy D, NP  losartan (COZAAR) 25 MG tablet Take 1 tablet (25 mg total) by mouth daily. Patient taking differently: Take 25 mg by mouth every evening.  04/20/18  Yes Clegg, Amy D, NP  Potassium Chloride ER 20 MEQ TBCR Take 20 mEq by mouth daily. 04/20/18  Yes Clegg, Amy D, NP  spironolactone (ALDACTONE) 25 MG tablet Take 0.5 tablets (12.5 mg total) by mouth daily. 04/20/18  Yes Clegg, Amy D, NP  torsemide (DEMADEX) 20 MG tablet Take 40 mg (2 Tablets) in the AM and 20 mg (1 Tablet) in the PM Patient taking differently: Take 20-40 mg by mouth See admin instructions. Take 40 mg (2 Tablets) in the AM and 20 mg (1 Tablet) in the PM 04/20/18  Yes Clegg, Amy D, NP  warfarin (COUMADIN) 7.5 MG tablet Take 1 tablet (7.5 mg total) by mouth daily at 6 PM. Patient taking differently: Take 7.5 mg by mouth See admin instructions. Take 1 tablet (7.5 mg) by mouth on Mondays, Wednesdays, & Fridays at 6pm Take 0.5  tablet (3.75 mg) by mouth Sundays, Tuesdays, Thursdays, Saturdays at 6pm. 04/20/18  Yes Clegg, Amy D, NP  oxyCODONE-acetaminophen (PERCOCET/ROXICET) 5-325 MG tablet Take 1 tablet by mouth every 4 (four) hours as needed. 04/29/18   Garlon Hatchet, PA-C  sildenafil (VIAGRA) 100 MG tablet Take 1 tablet (100 mg total) by mouth daily as needed for erectile dysfunction. 04/20/18  Clegg, Amy D, NP   No results found.  Positive ROS: All other systems have been reviewed and were otherwise negative with the exception of those mentioned in the HPI and as above.  Physical Exam: General: Alert, no acute distress Cardiovascular: No pedal edema Respiratory: No cyanosis, no use of accessory musculature GI: No organomegaly, abdomen is soft and non-tender Skin: No lesions in the area of chief complaint, he has chronic woody induration of the lower legs about mid tibia and distal. Neurologic: Sensation intact distally Psychiatric: Patient is competent for consent with normal mood and affect Lymphatic: No axillary or cervical lymphadenopathy   Assessment: 1.  Right quadriceps tendon rupture  Plan: -Plan for open repair of the quadriceps tendon today.  We again reviewed the risk, benefits, and indications of this procedure at length including but not limited to bleeding, infection, damage to surrounding neurovascular structures, development of blood clots, cardiac risk, risk of anesthesia, the risk of blood clots,.  He has provided informed consent.  -He has been fitted preoperatively with a Bledsoe style extension knee brace.  We will place him in that immediately postoperatively.  He will remain in this and will be just 30% weightbearing to that leg in the brace.  -We will likely need to be admitted for observation due to his complicated profile.    Yolonda Kida, MD Cell [phone]    06/10/2018 1:46 PM

## 2018-06-10 NOTE — Transfer of Care (Signed)
Immediate Anesthesia Transfer of Care Note  Patient: Mark Baker  Procedure(s) Performed: REPAIR QUADRICEP TENDON (Right )  Patient Location: PACU  Anesthesia Type:General  Level of Consciousness: awake, oriented and patient cooperative  Airway & Oxygen Therapy: Patient Spontanous Breathing and Patient connected to face mask oxygen  Post-op Assessment: Report given to RN and Post -op Vital signs reviewed and stable  Post vital signs: Reviewed  Last Vitals:  Vitals Value Taken Time  BP 125/91 06/10/2018  4:24 PM  Temp 36.3 C 06/10/2018  4:22 PM  Pulse 78 06/10/2018  4:32 PM  Resp 20 06/10/2018  4:32 PM  SpO2 96 % 06/10/2018  4:32 PM  Vitals shown include unvalidated device data.  Last Pain:  Vitals:   06/10/18 1622  TempSrc:   PainSc: (P) 0-No pain         Complications: No apparent anesthesia complications

## 2018-06-10 NOTE — Brief Op Note (Signed)
06/10/2018  4:06 PM  PATIENT:  Mark Baker  37 y.o. male  PRE-OPERATIVE DIAGNOSIS:  Right quadriceps rupture  POST-OPERATIVE DIAGNOSIS:  Right quadriceps rupture  PROCEDURE:  Procedure(s): REPAIR QUADRICEP TENDON (Right)  SURGEON:  Surgeon(s) and Role:    * Yolonda Kida, MD - Primary  PHYSICIAN ASSISTANT:   ASSISTANTS: Hart Carwin, RNFA April Green, RNFA   ANESTHESIA:   general  EBL: 30 cc  BLOOD ADMINISTERED:none  DRAINS: none   LOCAL MEDICATIONS USED:  MARCAINE     SPECIMEN:  No Specimen  DISPOSITION OF SPECIMEN:  N/A  COUNTS:  YES  TOURNIQUET:  * No tourniquets in log *  DICTATION: .Note written in EPIC  PLAN OF CARE: Admit for overnight observation  PATIENT DISPOSITION:  PACU - hemodynamically stable.   Delay start of Pharmacological VTE agent (>24hrs) due to surgical blood loss or risk of bleeding: not applicable

## 2018-06-10 NOTE — Anesthesia Procedure Notes (Signed)
Arterial Line Insertion Start/End10/30/2019 2:25 PM, 06/10/2018 2:30 PM Performed by: Margarita Rana, CRNA, CRNA  Preanesthetic checklist: patient identified, IV checked, site marked, risks and benefits discussed, surgical consent, monitors and equipment checked, pre-op evaluation, timeout performed and anesthesia consent Right, radial was placed Catheter size: 20 G Hand hygiene performed  and maximum sterile barriers used   Attempts: 2 Procedure performed without using ultrasound guided technique. Following insertion, dressing applied. Post procedure assessment: normal  Patient tolerated the procedure well with no immediate complications.

## 2018-06-11 ENCOUNTER — Encounter (HOSPITAL_COMMUNITY): Payer: Self-pay | Admitting: Orthopedic Surgery

## 2018-06-11 DIAGNOSIS — S76111A Strain of right quadriceps muscle, fascia and tendon, initial encounter: Secondary | ICD-10-CM | POA: Diagnosis not present

## 2018-06-11 LAB — PROTIME-INR
INR: 1.22
Prothrombin Time: 15.3 seconds — ABNORMAL HIGH (ref 11.4–15.2)

## 2018-06-11 MED ORDER — OXYCODONE HCL 5 MG PO TABS: 5.0000 mg | ORAL_TABLET | ORAL | 0 refills | Status: DC | PRN

## 2018-06-11 NOTE — Progress Notes (Signed)
AVS given and reviewed with pt. Printed prescription provided. All questions answered to satisfaction. Sherwood Gambler, ED CM, paged regarding pt equipment. Will continue to monitor.  1935: 3in1 and walker delivered to bedside. Pt to be escorted off the unit via wheelchair by staff member.

## 2018-06-11 NOTE — Care Management (Signed)
ED CM received call from RN on 5N concerning patient waiting several hours for equipment to be delivered to room for discharge home. ED CM will attempt to assist for patient discharge tonight.

## 2018-06-11 NOTE — Evaluation (Addendum)
Physical Therapy Evaluation Patient Details Name: Mark Baker MRN: [id number] DOB: 04/11/1981 Today's Date: 06/11/2018   History of Present Illness  Patient is a 37 y/o male who presents s/p right quad tendon repair. PMh includes HTN, CHF, OSA on CPAP, NICM.   Clinical Impression  Patient presents with pain and post surgical deficits s/p above surgery. Pt independent PTA, but using crutches for the last week after initial injury. Nephew will be assisting pt at home. Tolerated gait training with Min guard assist for balance/safety. Needs assist to stand from low surfaces. Does not like using RW due to fear of wheels slipping out- wants a standard walker. Concerned that pt's crutches are not appropriate size. Recommend bariatric DME- 3 in 1 commode, standard walker, crutches (has some at home but think they are normal). Education re: elevation, brace, WB status, exercises etc. Sp02 dropped to 80% on RA. Recovered after 1 minute of breathing and rest. Performed stair training with Min guard assist and cues; family training performed with nephew.Pt anxious about stairs. Will follow acutely to maximize independence and mobility prior to return home.     Follow Up Recommendations Home health PT;Supervision for mobility/OOB    Equipment Recommendations  3in1 (PT);Standard walker(bariatric standard walker (without wheels) and bari BSC)    Recommendations for Other Services       Precautions / Restrictions Precautions Precautions: Fall Required Braces or Orthoses: Other Brace/Splint Other Brace/Splint: bledsoe brace locked into full extension Restrictions Weight Bearing Restrictions: Yes RLE Weight Bearing: Partial weight bearing RLE Partial Weight Bearing Percentage or Pounds: 50% with bledsoe brace      Mobility  Bed Mobility               General bed mobility comments: Sitting EOB upon PT arrival.   Transfers Overall transfer level: Needs assistance Equipment used: Rolling  walker (2 wheeled) Transfers: Sit to/from Stand Sit to Stand: Mod assist;Min guard         General transfer comment: Nephew helped assist pt up from EOB; able to stand from chair with bil arm rests.   Ambulation/Gait Ambulation/Gait assistance: Min guard Gait Distance (Feet): 20 Feet Assistive device: Rolling walker (2 wheeled)(bari RW) Gait Pattern/deviations: Step-to pattern;Decreased stance time - right;Decreased step length - left;Trunk flexed;Wide base of support Gait velocity: decreased   General Gait Details: Step to gait with increased WB through BUEs. 3/4 DOE,. Sp02 dropped to 80% on RA. Cues for pursed lip breathing. Cues to adhere to 50% WB through RLE.  Stairs Stairs: Yes Stairs assistance: Min assist;Min guard Stair Management: One rail Left;With crutches Number of Stairs: 2 General stair comments: Cues for technique/safety; used LUE on rail and RUE with crutch.  Wheelchair Mobility    Modified Rankin (Stroke Patients Only)       Balance Overall balance assessment: Needs assistance;History of Falls Sitting-balance support: Feet supported;No upper extremity supported Sitting balance-Leahy Scale: Good     Standing balance support: During functional activity;Single extremity supported Standing balance-Leahy Scale: Poor Standing balance comment: Requires UE support in standing.                             Pertinent Vitals/Pain Pain Assessment: Faces Faces Pain Scale: Hurts little more Pain Location: RLE with movement Pain Descriptors / Indicators: Sore;Operative site guarding Pain Intervention(s): Monitored during session;Repositioned    Home Living Family/patient expects to be discharged to:: Private residence Living Arrangements: Children;Other relatives(nephew, 82 y/o daughter) Available Help at  Discharge: Family;Available 24 hours/day Type of Home: House Home Access: Stairs to enter Entrance Stairs-Rails: Left Entrance Stairs-Number of  Steps: 4 or none Home Layout: Two level;Able to live on main level with bedroom/bathroom Home Equipment: Crutches      Prior Function Level of Independence: Independent         Comments: Pt using crutches for the last week when injury occurred. Prior to this, independent. Does not work.     Hand Dominance        Extremity/Trunk Assessment   Upper Extremity Assessment Upper Extremity Assessment: Defer to OT evaluation    Lower Extremity Assessment Lower Extremity Assessment: RLE deficits/detail RLE Deficits / Details: Ankle AROM WFL; RLE: Unable to fully assess due to immobilization RLE Sensation: WNL       Communication   Communication: No difficulties  Cognition Arousal/Alertness: Awake/alert Behavior During Therapy: Anxious Overall Cognitive Status: Within Functional Limits for tasks assessed                                        General Comments General comments (skin integrity, edema, etc.): Nephew present during session.    Exercises General Exercises - Lower Extremity Ankle Circles/Pumps: Right;AROM;15 reps;Supine   Assessment/Plan    PT Assessment Patient needs continued PT services  PT Problem List Decreased mobility;Decreased strength;Obesity;Decreased range of motion;Decreased activity tolerance;Cardiopulmonary status limiting activity;Decreased skin integrity;Pain;Decreased balance;Decreased knowledge of use of DME       PT Treatment Interventions Functional mobility training;Balance training;Patient/family education;DME instruction;Gait training;Therapeutic activities;Retail buyer;Therapeutic exercise    PT Goals (Current goals can be found in the Care Plan section)  Acute Rehab PT Goals Patient Stated Goal: to get home PT Goal Formulation: With patient Time For Goal Achievement: 06/25/18 Potential to Achieve Goals: Good    Frequency Min 3X/week   Barriers to discharge Inaccessible home  environment stairs to enter home    Co-evaluation               AM-PAC PT "6 Clicks" Daily Activity  Outcome Measure Difficulty turning over in bed (including adjusting bedclothes, sheets and blankets)?: A Little Difficulty moving from lying on back to sitting on the side of the bed? : A Little Difficulty sitting down on and standing up from a chair with arms (e.g., wheelchair, bedside commode, etc,.)?: Unable Help needed moving to and from a bed to chair (including a wheelchair)?: A Little Help needed walking in hospital room?: A Little Help needed climbing 3-5 steps with a railing? : A Little 6 Click Score: 16    End of Session Equipment Utilized During Treatment: Other (comment)(bledsoe brace) Activity Tolerance: Patient tolerated treatment well Patient left: in chair;with call bell/phone within reach;with family/visitor present Nurse Communication: Mobility status PT Visit Diagnosis: Pain;Difficulty in walking, not elsewhere classified (R26.2);Unsteadiness on feet (R26.81) Pain - Right/Left: Right Pain - part of body: Leg    Time: 0312-8118 PT Time Calculation (min) (ACUTE ONLY): 34 min   Charges:   PT Evaluation $PT Eval Moderate Complexity: 1 Mod PT Treatments $Gait Training: 8-22 mins        Mylo Red, PT, DPT Acute Rehabilitation Services Pager [phone] Office [phone]      Blake Divine A Lanier Ensign 06/11/2018, 11:17 AM

## 2018-06-11 NOTE — Plan of Care (Signed)

## 2018-06-11 NOTE — Progress Notes (Signed)
   Subjective:  Patient reports pain as mild.  No complaints of NV/SOB/CP this am.  Doing well.    Objective:   VITALS:   Vitals:   06/10/18 2112 06/11/18 0011 06/11/18 0321 06/11/18 0545  BP: (!) 120/93 (!) 141/103 (!) 152/94 125/89  Pulse: 85 84 87   Resp: 18 18 18    Temp: 98.1 F (36.7 C)  98.2 F (36.8 C)   TempSrc: Oral  Oral   SpO2: 99% 100% 96%   Weight:      Height:        Neurovascular intact Dorsiflexion/Plantar flexion intact Incision: dressing C/D/I Compartment soft Brace in place  Lab Results  Component Value Date   WBC 6.5 06/10/2018   HGB 12.8 (L) 06/10/2018   HCT 44.8 06/10/2018   MCV 94.3 06/10/2018   PLT 225 06/10/2018   BMET    Component Value Date/Time   NA 140 06/10/2018 1216   NA 145 (H) 12/08/2017 1413   K 3.3 (L) 06/10/2018 1216   CL 103 06/10/2018 1216   CO2 28 06/10/2018 1216   GLUCOSE 103 (H) 06/10/2018 1216   BUN 16 06/10/2018 1216   BUN 11 12/08/2017 1413   CREATININE 1.44 (H) 06/10/2018 1216   CREATININE 1.45 (H) 01/18/2015 1045   CALCIUM 8.6 (L) 06/10/2018 1216   GFRNONAA >60 06/10/2018 1216   GFRNONAA 62 01/18/2015 1045   GFRAA >60 06/10/2018 1216   GFRAA 72 01/18/2015 1045     Assessment/Plan: 1 Day Post-Op   Active Problems:   Rupture of right quadriceps muscle   Quadriceps tendon rupture, right, initial encounter   Up with therapy - 50% WB to RLE with brace locked at all times - dc home later today.  Will likely need some DME, will await PT note - follow up with me in 2 weeks - resume preop coumadin.   Yolonda Kida 06/11/2018, 7:42 AM   Maryan Rued, MD [phone]

## 2018-06-11 NOTE — Anesthesia Postprocedure Evaluation (Signed)
Anesthesia Post Note  Patient: Mark Baker  Procedure(s) Performed: REPAIR QUADRICEP TENDON (Right )     Patient location during evaluation: PACU Anesthesia Type: General Level of consciousness: awake and alert Pain management: pain level controlled Vital Signs Assessment: post-procedure vital signs reviewed and stable Respiratory status: spontaneous breathing, nonlabored ventilation, respiratory function stable and patient connected to nasal cannula oxygen Cardiovascular status: blood pressure returned to baseline and stable Postop Assessment: no apparent nausea or vomiting Anesthetic complications: no    Last Vitals:  Vitals:   06/11/18 0321 06/11/18 0545  BP: (!) 152/94 125/89  Pulse: 87   Resp: 18   Temp: 36.8 C   SpO2: 96%     Last Pain:  Vitals:   06/11/18 0321  TempSrc: Oral  PainSc:                  Phillips Grout

## 2018-06-11 NOTE — Care Management (Signed)
Bariatric 3n1 and walker delivered to patient's room, updated RN on 5N.  No further CM needed.

## 2018-06-11 NOTE — Discharge Instructions (Signed)
Discharge instructions:  -50% weightbearing to the right lower extremity. -Maintain your knee brace locked in full extension at all times. -Apply ice to the right knee for 20 to 30 minutes at a time throughout the day. -Maintain postoperative bandages until your follow-up appointment in 2 weeks.  If they become saturated may remove and replace with daily dry dressings. -Resume your warfarin as per before surgery immediately postoperatively. -For mild to moderate pain use Tylenol and/or Advil as needed.  For breakthrough pain use oxycodone as needed and directed. -Do not get your incision wet.  He will follow-up with Dr. Aundria Rud in 2 weeks for wound check and staple removal.   Information on my medicine - Coumadin   (Warfarin)  This medication education was reviewed with me or my healthcare representative as part of my discharge preparation.    Why was Coumadin prescribed for you? Coumadin was prescribed for you because you have a blood clot or a medical condition that can cause an increased risk of forming blood clots. Blood clots can cause serious health problems by blocking the flow of blood to the heart, lung, or brain. Coumadin can prevent harmful blood clots from forming. As a reminder your indication for Coumadin is:   Stroke Prevention Because Of Atrial Fibrillation  What test will check on my response to Coumadin? While on Coumadin (warfarin) you will need to have an INR test regularly to ensure that your dose is keeping you in the desired range. The INR (international normalized ratio) number is calculated from the result of the laboratory test called prothrombin time (PT).  If an INR APPOINTMENT HAS NOT ALREADY BEEN MADE FOR YOU please schedule an appointment to have this lab work done by your health care provider within 7 days. Your INR goal is usually a number between:  2 to 3 or your provider may give you a more narrow range like 2-2.5.  Ask your health care provider during an  office visit what your goal INR is.  What  do you need to  know  About  COUMADIN? Take Coumadin (warfarin) exactly as prescribed by your healthcare provider about the same time each day.  DO NOT stop taking without talking to the doctor who prescribed the medication.  Stopping without other blood clot prevention medication to take the place of Coumadin may increase your risk of developing a new clot or stroke.  Get refills before you run out.  What do you do if you miss a dose? If you miss a dose, take it as soon as you remember on the same day then continue your regularly scheduled regimen the next day.  Do not take two doses of Coumadin at the same time.  Important Safety Information A possible side effect of Coumadin (Warfarin) is an increased risk of bleeding. You should call your healthcare provider right away if you experience any of the following: ? Bleeding from an injury or your nose that does not stop. ? Unusual colored urine (red or dark brown) or unusual colored stools (red or black). ? Unusual bruising for unknown reasons. ? A serious fall or if you hit your head (even if there is no bleeding).  Some foods or medicines interact with Coumadin (warfarin) and might alter your response to warfarin. To help avoid this: ? Eat a balanced diet, maintaining a consistent amount of Vitamin K. ? Notify your provider about major diet changes you plan to make. ? Avoid alcohol or limit your intake to 1 drink  for women and 2 drinks for men per day. (1 drink is 5 oz. wine, 12 oz. beer, or 1.5 oz. liquor.)  Make sure that ANY health care provider who prescribes medication for you knows that you are taking Coumadin (warfarin).  Also make sure the healthcare provider who is monitoring your Coumadin knows when you have started a new medication including herbals and non-prescription products.  Coumadin (Warfarin)  Major Drug Interactions  Increased Warfarin Effect Decreased Warfarin Effect  Alcohol  (large quantities) Antibiotics (esp. Septra/Bactrim, Flagyl, Cipro) Amiodarone (Cordarone) Aspirin (ASA) Cimetidine (Tagamet) Megestrol (Megace) NSAIDs (ibuprofen, naproxen, etc.) Piroxicam (Feldene) Propafenone (Rythmol SR) Propranolol (Inderal) Isoniazid (INH) Posaconazole (Noxafil) Barbiturates (Phenobarbital) Carbamazepine (Tegretol) Chlordiazepoxide (Librium) Cholestyramine (Questran) Griseofulvin Oral Contraceptives Rifampin Sucralfate (Carafate) Vitamin K   Coumadin (Warfarin) Major Herbal Interactions  Increased Warfarin Effect Decreased Warfarin Effect  Garlic Ginseng Ginkgo biloba Coenzyme Q10 Green tea St. Johns wort    Coumadin (Warfarin) FOOD Interactions  Eat a consistent number of servings per week of foods HIGH in Vitamin K (1 serving =  cup)  Collards (cooked, or boiled & drained) Kale (cooked, or boiled & drained) Mustard greens (cooked, or boiled & drained) Parsley *serving size only =  cup Spinach (cooked, or boiled & drained) Swiss chard (cooked, or boiled & drained) Turnip greens (cooked, or boiled & drained)  Eat a consistent number of servings per week of foods MEDIUM-HIGH in Vitamin K (1 serving = 1 cup)  Asparagus (cooked, or boiled & drained) Broccoli (cooked, boiled & drained, or raw & chopped) Brussel sprouts (cooked, or boiled & drained) *serving size only =  cup Lettuce, raw (green leaf, endive, romaine) Spinach, raw Turnip greens, raw & chopped   These websites have more information on Coumadin (warfarin):  http://www.king-russell.com/; https://www.hines.net/;

## 2018-06-11 NOTE — Care Management Note (Signed)
Case Management Note  Patient Details  Name: [name] MRN: [id number] Date of Birth: Dec 11, 1980  Subjective/Objective:   37 yr old gentleman s/p right quad tendon repair.                 Action/Plan: Case manager spoke with patient concerning discharge plan and DME. Patient has [employer], CM called referral for Home Health PT to [facility]  with intake:[phone], Faxed orders to [phone]. Patients Intake ID# is [id number].  Requested start of care for 06/12/18. Patient will have family support at discharge. DME has been delivered to patient's room.    Expected Discharge Date:  06/11/18               Expected Discharge Plan:  Home w Home Health Services  In-House Referral:  NA  Discharge planning Services  CM Consult  Post Acute Care Choice:  Durable Medical Equipment, Home Health Choice offered to:  Patient  DME Arranged:  Bedside commode, Walker(bariatric equipment) DME Agency:  [facility]  HH Arranged:  PT [facility] Agency:  Other - See comment(to be arranged by [facility])  Status of Service:  In process, will continue to follow  If discussed at Long Length of Stay Meetings, dates discussed:    Additional Comments:  Durenda Guthrie, RN 06/11/2018, 2:04 PM

## 2018-06-12 NOTE — Discharge Summary (Signed)
Patient ID: Mark Baker MRN: [id number] DOB/AGE: 1981/02/29 37 y.o.  Admit date: 06/10/2018 Discharge date: 06/11/2018  Primary Diagnosis: Right quadriceps tendon rupture Admission Diagnoses:  Past Medical History:  Diagnosis Date  . Childhood asthma    no exacerbation since age 37   . Chronic combined systolic and diastolic CHF (congestive heart failure) ([place])    a) ECHO (11/2012): EF 40-45%, RV fx difficult to see, nl size b) ECHO (05/2014): EF 20-25%, grade 2 DD, RV mildly dilated and sys fx mod reduced  . Hypertension   . Migraine    "down to maybe 2/year now; used to have them q other week" (11/26/2017)  . NICM (nonischemic cardiomyopathy) ([place])    a) (11/2012): normal coronaries  . OSA on CPAP    Discharge Diagnoses:   Active Problems:   Rupture of right quadriceps muscle   Quadriceps tendon rupture, right, initial encounter  Estimated body mass index is 62.26 kg/m as calculated from the following:   Height as of this encounter: 6' 1.5" (1.867 m).   Weight as of this encounter: 217 kg.  Procedure:  Procedure(s) (LRB): REPAIR QUADRICEP TENDON (Right)   Consults: None  HPI:  That he was admitted following open reduction and repair of his quadriceps tendon rupture on the right.  This is a chronic injury and he was indicated for surgical fixation given his inability to extend the knee. Laboratory Data: Admission on 06/10/2018, Discharged on 06/11/2018  Component Date Value Ref Range Status  . WBC 06/10/2018 6.5  4.0 - 10.5 K/uL Final  . RBC 06/10/2018 4.75  4.22 - 5.81 MIL/uL Final  . Hemoglobin 06/10/2018 12.8* 13.0 - 17.0 g/dL Final  . HCT 06/10/2018 44.8  39.0 - 52.0 % Final  . MCV 06/10/2018 94.3  80.0 - 100.0 fL Final  . MCH 06/10/2018 26.9  26.0 - 34.0 pg Final  . MCHC 06/10/2018 28.6* 30.0 - 36.0 g/dL Final  . RDW 06/10/2018 13.7  11.5 - 15.5 % Final  . Platelets 06/10/2018 225  150 - 400 K/uL Final  . nRBC 06/10/2018 0.0  0.0 - 0.2 % Final   Performed  at [facility] [address], [place], [place] [address]  . Sodium 06/10/2018 140  135 - 145 mmol/L Final  . Potassium 06/10/2018 3.3* 3.5 - 5.1 mmol/L Final  . Chloride 06/10/2018 103  98 - 111 mmol/L Final  . CO2 06/10/2018 28  22 - 32 mmol/L Final  . Glucose, Bld 06/10/2018 103* 70 - 99 mg/dL Final  . BUN 06/10/2018 16  6 - 20 mg/dL Final  . Creatinine, Ser 06/10/2018 1.44* 0.61 - 1.24 mg/dL Final  . Calcium 06/10/2018 8.6* 8.9 - 10.3 mg/dL Final  . GFR calc non Af Amer 06/10/2018 >60  >60 mL/min Final  . GFR calc Af Amer 06/10/2018 >60  >60 mL/min Final   Comment: (NOTE) The eGFR has been calculated using the CKD EPI equation. This calculation has not been validated in all clinical situations. eGFR's persistently <60 mL/min signify possible Chronic Kidney Disease.   Georgiann Hahn gap 06/10/2018 9  5 - 15 Final   Performed at [facility], [place] [address] [place], [place] [address]  . Prothrombin Time 06/10/2018 14.7  11.4 - 15.2 seconds Final  . INR 06/10/2018 1.16   Final   Performed at [facility], [place] [address] [place], [place] [address]  . Prothrombin Time 06/11/2018 15.3* 11.4 - 15.2 seconds Final  . INR 06/11/2018  1.22   Final   Performed at [facility], [place] [address] [place], [place] [address]  Hospital Outpatient Visit on 04/27/2018  Component Date Value Ref Range Status  . Sodium 04/27/2018 144  135 - 145 mmol/L Final  . Potassium 04/27/2018 3.7  3.5 - 5.1 mmol/L Final  . Chloride 04/27/2018 101  98 - 111 mmol/L Final  . CO2 04/27/2018 30  22 - 32 mmol/L Final  . Glucose, Bld 04/27/2018 115* 70 - 99 mg/dL Final  . BUN 04/27/2018 14  6 - 20 mg/dL Final  . Creatinine, Ser 04/27/2018 1.52* 0.61 - 1.24 mg/dL Final  . Calcium 04/27/2018 8.9  8.9 - 10.3 mg/dL Final  . GFR calc non Af Amer 04/27/2018 57* >60 mL/min Final  . GFR calc Af Amer 04/27/2018 >60  >60 mL/min Final   Comment: (NOTE) The eGFR has been calculated using the CKD EPI  equation. This calculation has not been validated in all clinical situations. eGFR's persistently <60 mL/min signify possible Chronic Kidney Disease.   Georgiann Hahn gap 04/27/2018 13  5 - 15 Final   Performed at [facility], [place] [address] [place], [place] [address]  Hospital Outpatient Visit on 04/20/2018  Component Date Value Ref Range Status  . Sodium 04/20/2018 141  135 - 145 mmol/L Final  . Potassium 04/20/2018 3.2* 3.5 - 5.1 mmol/L Final  . Chloride 04/20/2018 99  98 - 111 mmol/L Final  . CO2 04/20/2018 33* 22 - 32 mmol/L Final  . Glucose, Bld 04/20/2018 103* 70 - 99 mg/dL Final  . BUN 04/20/2018 15  6 - 20 mg/dL Final  . Creatinine, Ser 04/20/2018 1.63* 0.61 - 1.24 mg/dL Final  . Calcium 04/20/2018 8.7* 8.9 - 10.3 mg/dL Final  . GFR calc non Af Amer 04/20/2018 52* >60 mL/min Final  . GFR calc Af Amer 04/20/2018 >60  >60 mL/min Final   Comment: (NOTE) The eGFR has been calculated using the CKD EPI equation. This calculation has not been validated in all clinical situations. eGFR's persistently <60 mL/min signify possible Chronic Kidney Disease.   . Anion gap 04/20/2018 9  5 - 15 Final   Performed at [facility], [place] [address] [place], [place] [address]  . Uric Acid, Serum 04/20/2018 9.5* 3.7 - 8.6 mg/dL Final   Performed at [facility], [place] [address], [place], [place] [address]     X-Rays:No results found.  EKG: Orders placed or performed during the hospital encounter of 12/29/17  . EKG 12-Lead  . EKG 12-Lead     Hospital Course: Mark Baker is a 37 y.o. who was admitted to Hospital. They were brought to the operating room on 06/10/2018 and underwent Procedure(s): REPAIR QUADRICEP TENDON.  Patient tolerated the procedure well and was later transferred to the recovery room and then to the orthopaedic floor for postoperative care.  They were given PO and IV analgesics for pain control following their surgery.  They were given 24 hours of  postoperative antibiotics of  Anti-infectives (From admission, onward)   Start     Dose/Rate Route Frequency Ordered Stop   06/10/18 1300  ceFAZolin (ANCEF) 3 g in dextrose 5 % 50 mL IVPB     3 g 100 mL/hr over 30 Minutes Intravenous To ShortStay Surgical 06/09/18 1238 06/10/18 1523     and started on DVT prophylaxis in the form of Coumadin.   PT and OT were ordered for total joint protocol.  On  postoperative day #1 his pain was well controlled.  He mobilized with physical therapy and was indicated for discharge home at that time.  Patient was seen in rounds and was ready to go home.   Diet: Regular diet Activity:PWB Follow-up:in 2 weeks Disposition - Home Discharged Condition: good   Discharge Instructions    Call MD / Call 911   Complete by:  As directed    If you experience chest pain or shortness of breath, CALL 911 and be transported to the hospital emergency room.  If you develope a fever above 101 F, pus (Estrella drainage) or increased drainage or redness at the wound, or calf pain, call your surgeon's office.   Call MD / Call 911   Complete by:  As directed    If you experience chest pain or shortness of breath, CALL 911 and be transported to the hospital emergency room.  If you develope a fever above 101 F, pus ([place]ai drainage) or increased drainage or redness at the wound, or calf pain, call your surgeon's office.   Constipation Prevention   Complete by:  As directed    Drink plenty of fluids.  Prune juice may be helpful.  You may use a stool softener, such as Colace (over the counter) 100 mg twice a day.  Use MiraLax (over the counter) for constipation as needed.   Constipation Prevention   Complete by:  As directed    Drink plenty of fluids.  Prune juice may be helpful.  You may use a stool softener, such as Colace (over the counter) 100 mg twice a day.  Use MiraLax (over the counter) for constipation as needed.   Diet - low sodium heart healthy   Complete by:  As directed     Diet - low sodium heart healthy   Complete by:  As directed    Increase activity slowly as tolerated   Complete by:  As directed    Increase activity slowly as tolerated   Complete by:  As directed      Allergies as of 06/11/2018      Reactions   Aspirin Hives, Itching, Rash   Entresto [sacubitril-valsartan] Cough   Was on Entresto 07/2016-09/2016      Medication List    STOP taking these medications   oxyCODONE-acetaminophen 5-325 MG tablet Commonly known as:  PERCOCET/ROXICET     TAKE these medications   amiodarone 200 MG tablet Commonly known as:  PACERONE Take 1 tablet (200 mg total) by mouth 2 (two) times daily.   atorvastatin 40 MG tablet Commonly known as:  LIPITOR Take 1 tablet (40 mg total) by mouth daily.   carvedilol 3.125 MG tablet Commonly known as:  COREG Take 1 tablet (3.125 mg total) by mouth 2 (two) times daily.   digoxin 0.25 MG tablet Commonly known as:  LANOXIN Take 1 tablet (0.25 mg total) by mouth daily.   hydrALAZINE 50 MG tablet Commonly known as:  APRESOLINE Take 1 tablet (50 mg total) by mouth every 8 (eight) hours.   isosorbide mononitrate 60 MG 24 hr tablet Commonly known as:  IMDUR Take 1 tablet (60 mg total) by mouth daily.   losartan 25 MG tablet Commonly known as:  COZAAR Take 1 tablet (25 mg total) by mouth daily. What changed:  when to take this   oxyCODONE 5 MG immediate release tablet Commonly known as:  Oxy IR/ROXICODONE Take 1-2 tablets (5-10 mg total) by mouth every 4 (four) hours as needed for moderate  pain or severe pain.   Potassium Chloride ER 20 MEQ Tbcr Take 20 mEq by mouth daily.   sildenafil 100 MG tablet Commonly known as:  VIAGRA Take 1 tablet (100 mg total) by mouth daily as needed for erectile dysfunction.   spironolactone 25 MG tablet Commonly known as:  ALDACTONE Take 0.5 tablets (12.5 mg total) by mouth daily.   torsemide 20 MG tablet Commonly known as:  DEMADEX Take 40 mg (2 Tablets) in the  AM and 20 mg (1 Tablet) in the PM What changed:    how much to take  how to take this  when to take this   warfarin 7.5 MG tablet Commonly known as:  COUMADIN Take as directed. If you are unsure how to take this medication, talk to your nurse or doctor. Original instructions:  Take 1 tablet (7.5 mg total) by mouth daily at 6 PM. What changed:    when to take this  additional instructions      Follow-up Information    Nicholes Stairs, MD In 2 weeks.   Specialty:  Orthopedic Surgery Why:  For suture removal, For wound re-check Contact information: [address] [place] [address] [phone]        [facility] Follow up.   Why:  A representative from [facility] will contact you to provide name of [facility] that will be providing your Home Health therapy. If you have not heard from anyone with 48 business hours contact the number on your [employer] card.           Signed: Geralynn Rile, MD Orthopaedic Surgery 06/12/2018, 3:52 PM

## 2018-07-02 ENCOUNTER — Encounter: Payer: Self-pay | Admitting: Physician Assistant

## 2018-07-22 ENCOUNTER — Encounter (HOSPITAL_COMMUNITY): Payer: Self-pay | Admitting: *Deleted

## 2018-07-22 ENCOUNTER — Other Ambulatory Visit: Payer: Self-pay

## 2018-07-22 NOTE — Anesthesia Preprocedure Evaluation (Addendum)
Anesthesia Evaluation  Patient identified by MRN, date of birth, ID band Patient awake    Reviewed: Allergy & Precautions, NPO status , Patient's Chart, lab work & pertinent test results, reviewed documented beta blocker date and time   History of Anesthesia Complications Negative for: history of anesthetic complications  Airway Mallampati: II  TM Distance: >3 FB Neck ROM: Full    Dental  (+) Teeth Intact   Pulmonary asthma , sleep apnea and Continuous Positive Airway Pressure Ventilation ,    breath sounds clear to auscultation       Cardiovascular hypertension, Pt. on medications and Pt. on home beta blockers +CHF  + dysrhythmias Atrial Fibrillation  Rhythm:Regular     Neuro/Psych  Headaches,  Neuromuscular disease    GI/Hepatic negative GI ROS, Neg liver ROS,   Endo/Other  Morbid obesity  Renal/GU CRFRenal disease     Musculoskeletal   Abdominal   Peds  Hematology  (+) anemia ,   Anesthesia Other Findings History includes non-ischemic cardiomyopathy ("likely tachymediated although noncompliance may play a role"), chronic combined systolic and diastolic CHF (EF 11-91%), OSA (CPAP), PAF (diagnosed 10/31/16), syncope 12/21/16 (SR with occasional PVCs on cardiac monitor), HTN, morbid obesity, medical non-compliance, never smoker.   Reproductive/Obstetrics                                                             Anesthesia Evaluation  Patient identified by MRN, date of birth, ID band Patient awake    Reviewed: Allergy & Precautions, NPO status , Patient's Chart, lab work & pertinent test results  Airway Mallampati: II  TM Distance: >3 FB Neck ROM: Full    Dental no notable dental hx.    Pulmonary sleep apnea and Continuous Positive Airway Pressure Ventilation ,    Pulmonary exam normal breath sounds clear to auscultation       Cardiovascular hypertension, Pt. on  medications +CHF (EF 20%)  Normal cardiovascular exam+ dysrhythmias Atrial Fibrillation  Rhythm:Regular Rate:Normal     Neuro/Psych negative neurological ROS  negative psych ROS   GI/Hepatic negative GI ROS, Neg liver ROS,   Endo/Other  Morbid obesity  Renal/GU negative Renal ROS  negative genitourinary   Musculoskeletal negative musculoskeletal ROS (+)   Abdominal   Peds negative pediatric ROS (+)  Hematology negative hematology ROS (+)   Anesthesia Other Findings   Reproductive/Obstetrics negative OB ROS                             Anesthesia Physical Anesthesia Plan  ASA: IV  Anesthesia Plan: General   Post-op Pain Management:    Induction: Intravenous  PONV Risk Score and Plan: 2 and Ondansetron and Treatment may vary due to age or medical condition  Airway Management Planned: Oral ETT  Additional Equipment:   Intra-op Plan:   Post-operative Plan: Extubation in OR  Informed Consent: I have reviewed the patients History and Physical, chart, labs and discussed the procedure including the risks, benefits and alternatives for the proposed anesthesia with the patient or authorized representative who has indicated his/her understanding and acceptance.   Dental advisory given  Plan Discussed with: CRNA  Anesthesia Plan Comments: (High risk for cardiac and anesthesia complications explained to patient.)  Anesthesia Quick Evaluation  Anesthesia Physical Anesthesia Plan  ASA: IV  Anesthesia Plan: General   Post-op Pain Management:    Induction: Intravenous  PONV Risk Score and Plan: 2 and Ondansetron and Dexamethasone  Airway Management Planned: Oral ETT  Additional Equipment: None  Intra-op Plan:   Post-operative Plan: Extubation in OR  Informed Consent: I have reviewed the patients History and Physical, chart, labs and discussed the procedure including the risks, benefits and alternatives for the  proposed anesthesia with the patient or authorized representative who has indicated his/her understanding and acceptance.   Dental advisory given  Plan Discussed with: CRNA and Surgeon  Anesthesia Plan Comments: (PAT note written 07/22/2018 by Shonna Chock, PA-C. )       Anesthesia Quick Evaluation

## 2018-07-22 NOTE — Progress Notes (Signed)
Anesthesia Chart Review: SAME DAY WORK-UP  Case:  [id number] Date/Time:  07/23/18 1405   Procedure:  Right knee irrigation and debridement (Right )   Anesthesia type:  Choice   Pre-op diagnosis:  Right knee post op infection   Location:  [facility] ROOM 07 / [facility]   Surgeon:  Yolonda Kida, MD      DISCUSSION: Patient is a 37 year old male scheduled for the above procedure. He fell on 04/29/18 and developed right elbow and knee pain. 05/06/18 right knee CT showed high grade partial versus complete tear of distal quadriceps tendon and subtle non-displaced fracture of the posterior periphery of medial femoral condyle. He underwent right quadriceps tendon repair on 06/10/18. Apparently now with a right knee post-operative infection and needs I&D.   History includes non-ischemic cardiomyopathy ("likely tachymediated although noncompliance may play a role"), chronic combined systolic and diastolic CHF (EF 04-54%), OSA (CPAP), PAF (diagnosed 10/31/16), syncope 12/21/16 (SR with occasional PVCs on cardiac monitor), HTN, morbid obesity, medical non-compliance, never smoker.   Prior to undergoing quadriceps repair on 06/10/18, Dr. Aundria Rud reached out to cardiology for perioperative input. For that procedure, Dr. Gala Romney deemed patient "high risk for procedure due to morbid obesity and low EF."  For that procedure he was allowed to hold warfarin 5-7 days prior to surgery without bridge and resume warfarin ASAP post-operatively.   Notes indicate that he is not yet considered a candidate for ICD until he is able to lose weight (weight was 229.6 kg at his 01/15/18 EP visit and 191.7 kg during 05/2018 admission) INR is monitored through his PCP.  Patient is a same day work-up. He had HF cardiology input for his surgery less than two months ago. PAT phone RN to contact patient and inquire about any changes in his history or symptomology and confirm last warfarin dose. If nothing new reported then he will be assessed  on the day of surgery.   He will need labs including PT/INR on the day of surgery.   PROVIDERS: Sherren Mocha, MD is PCP  [name], MD is HF cardiologist. Last visit 04/20/18 with Tonye Becket, NP. He did not go to his 06/08/18 follow-up visit.  Loman Brooklyn, MD is EP cardiologist. Last visit 01/15/18 for ICD consideration. He wrote, "He is being referred for ICD consideration.Unfortunately, his weight is a complicating factor. He weighs 506 pounds today. The weight limit for our table in the EP lab is at 500 pounds. He would thus need to lose a significant amount of weight prior to consideration for ICD implantation. He also has a high risk of complications due to his weight from the procedure."   LABS: He will need updated labs on arrival. As of 06/10/18, Cr 1.44, H/H 12.8/44.8, glucose 103.    IMAGES: 1V CXR 11/25/17: IMPRESSION: Congestive heart failure with bilateral pulmonary interstitial Edema.   EKG: 12/29/17: NSR, right atrial enlargement. Left ventricular hypertrophy with QRS widening and repolarization abnormality.    CV:  Echo 11/26/17: Study Conclusions - Left ventricle: The cavity size was severely dilated. Wall thickness was increased in a pattern of moderate LVH. Systolic function was severely reduced. The estimated ejection fraction was in the range of 20% to 25%. Diffuse hypokinesis. There is akinesis of the inferolateral, inferior, and inferoseptal myocardium. - Mitral valve: There was mild regurgitation. - Left atrium: The atrium was severely dilated. - Right atrium: The atrium was severely dilated. Impressions: - Compared to the prior study, there has been  no significant interval change.  Cardiac cath 03/18/17:  Mid RCA lesion, 20 %stenosed. Findings: Ao = 118/88 (97) LV = 132/28 RA = 9 RV = 54/9 PA = 51/20 (34) PCW = 22 Fick cardiac output/index = 9.9/3.2 PVR = 1.2 WU SVR = 708 dynes Ao sat = 88% PA sat = 68%,   Assessment: 1. Minimal non-obstructive CAD 2. Mild pulmonary HTN 3. NICM with EF ~25-30% though hard to assess EF on LV gram due to poor opacification Plan/Discussion: Continue medical therapy. Stressed need for weight loss.   Cardiac monitor 02/03/17-02/22/17: Baseline sample showed SR with PVCs (1 in 1 minute) with HR of 85.6 bpm. There were 0 critical, 0 serious, and 2 stable events that occurred.    Past Medical History:  Diagnosis Date  . Childhood asthma    no exacerbation since age 29   . Chronic combined systolic and diastolic CHF (congestive heart failure) (HCC)    a) ECHO (11/2012): EF 40-45%, RV fx difficult to see, nl size b) ECHO (05/2014): EF 20-25%, grade 2 DD, RV mildly dilated and sys fx mod reduced  . Hypertension   . Migraine    "down to maybe 2/year now; used to have them q other week" (11/26/2017)  . NICM (nonischemic cardiomyopathy) (HCC)    a) (11/2012): normal coronaries  . OSA on CPAP     Past Surgical History:  Procedure Laterality Date  . CARDIOVERSION N/A 11/07/2016   Procedure: CARDIOVERSION;  Surgeon: Dolores Patty, MD;  Location: [facility] OR;  Service: Cardiovascular;  Laterality: N/A;  Will need extra help positioning patient  . KNEE ARTHROSCOPY Right 2003   ANTERIOR CRUCIATE LIGAMENT REPAIR   . LEFT HEART CATHETERIZATION WITH CORONARY ANGIOGRAM N/A 11/24/2012   Procedure: LEFT HEART CATHETERIZATION WITH CORONARY ANGIOGRAM;  Surgeon: Peter M Swaziland, MD;  Location: [facility] CATH LAB;  Service: Cardiovascular;  Laterality: N/A;  . QUADRICEPS TENDON REPAIR Right 06/10/2018   Procedure: REPAIR QUADRICEP TENDON;  Surgeon: Yolonda Kida, MD;  Location: [facility] OR;  Service: Orthopedics;  Laterality: Right;  . RIGHT/LEFT HEART CATH AND CORONARY ANGIOGRAPHY N/A 03/18/2017   Procedure: Right/Left Heart Cath and Coronary Angiography;  Surgeon: Dolores Patty, MD;  Location: [facility] INVASIVE CV LAB;  Service: Cardiovascular;  Laterality: N/A;  . TEE WITHOUT  CARDIOVERSION N/A 11/07/2016   Procedure: TRANSESOPHAGEAL ECHOCARDIOGRAM (TEE);  Surgeon: Dolores Patty, MD;  Location: [facility] OR;  Service: Cardiovascular;  Laterality: N/A;    MEDICATIONS: No current facility-administered medications for this encounter.    Marland Kitchen amiodarone (PACERONE) 200 MG tablet  . atorvastatin (LIPITOR) 40 MG tablet  . carvedilol (COREG) 3.125 MG tablet  . digoxin (LANOXIN) 0.25 MG tablet  . hydrALAZINE (APRESOLINE) 50 MG tablet  . isosorbide mononitrate (IMDUR) 60 MG 24 hr tablet  . losartan (COZAAR) 25 MG tablet  . oxyCODONE (ROXICODONE) 5 MG immediate release tablet  . Potassium Chloride ER 20 MEQ TBCR  . sildenafil (VIAGRA) 100 MG tablet  . spironolactone (ALDACTONE) 25 MG tablet  . torsemide (DEMADEX) 20 MG tablet  . warfarin (COUMADIN) 7.5 MG tablet    Velna Ochs [facility] Short Stay Center/Anesthesiology Phone [phone] 07/22/2018 12:33 PM

## 2018-07-22 NOTE — Progress Notes (Signed)
Pt denies SOB and chest pain. Pt tsated that he is under the care of Dr. Gala Romney, Cardiology. Pt denies having a stress test. Pt denies recent labs. Pt stated that last dose of Coumadin was " Monday."  Pt made aware to stop taking vitamins, fish oil and herbal medications. Do not take any NSAIDs ie: Ibuprofen, Advil, Naproxen (Aleve), Motrin, BC and Goody Powder. Pt advised to bring in mask and hose for CPAP; pt stated that his equipment is not compatible to ours. Pt verbalized understanding of all pre-op instructions. PA, Anesthesiology, asked to review pt history; see note.

## 2018-07-23 ENCOUNTER — Ambulatory Visit (HOSPITAL_COMMUNITY): Payer: Managed Care, Other (non HMO) | Admitting: Vascular Surgery

## 2018-07-23 ENCOUNTER — Inpatient Hospital Stay (HOSPITAL_COMMUNITY)
Admission: AD | Admit: 2018-07-23 | Discharge: 2018-07-30 | DRG: 856 | Disposition: A | Discharge status: Rehabilitation Facility | Payer: Managed Care, Other (non HMO) | Attending: Orthopedic Surgery | Admitting: Orthopedic Surgery

## 2018-07-23 ENCOUNTER — Other Ambulatory Visit: Payer: Self-pay

## 2018-07-23 ENCOUNTER — Encounter (HOSPITAL_COMMUNITY)
Admission: AD | Disposition: A | Discharge status: Rehabilitation Facility | Payer: Self-pay | Source: Home / Self Care | Attending: Orthopedic Surgery

## 2018-07-23 ENCOUNTER — Encounter (HOSPITAL_COMMUNITY): Payer: Self-pay

## 2018-07-23 DIAGNOSIS — S76111D Strain of right quadriceps muscle, fascia and tendon, subsequent encounter: Secondary | ICD-10-CM

## 2018-07-23 DIAGNOSIS — N179 Acute kidney failure, unspecified: Secondary | ICD-10-CM

## 2018-07-23 DIAGNOSIS — Z9981 Dependence on supplemental oxygen: Secondary | ICD-10-CM

## 2018-07-23 DIAGNOSIS — N170 Acute kidney failure with tubular necrosis: Secondary | ICD-10-CM | POA: Diagnosis not present

## 2018-07-23 DIAGNOSIS — E874 Mixed disorder of acid-base balance: Secondary | ICD-10-CM | POA: Diagnosis not present

## 2018-07-23 DIAGNOSIS — T8141XA Infection following a procedure, superficial incisional surgical site, initial encounter: Principal | ICD-10-CM | POA: Diagnosis present

## 2018-07-23 DIAGNOSIS — Z888 Allergy status to other drugs, medicaments and biological substances status: Secondary | ICD-10-CM

## 2018-07-23 DIAGNOSIS — J9602 Acute respiratory failure with hypercapnia: Secondary | ICD-10-CM

## 2018-07-23 DIAGNOSIS — I5022 Chronic systolic (congestive) heart failure: Secondary | ICD-10-CM | POA: Diagnosis present

## 2018-07-23 DIAGNOSIS — I130 Hypertensive heart and chronic kidney disease with heart failure and stage 1 through stage 4 chronic kidney disease, or unspecified chronic kidney disease: Secondary | ICD-10-CM | POA: Diagnosis present

## 2018-07-23 DIAGNOSIS — Z79891 Long term (current) use of opiate analgesic: Secondary | ICD-10-CM

## 2018-07-23 DIAGNOSIS — E876 Hypokalemia: Secondary | ICD-10-CM | POA: Diagnosis not present

## 2018-07-23 DIAGNOSIS — A4901 Methicillin susceptible Staphylococcus aureus infection, unspecified site: Secondary | ICD-10-CM | POA: Diagnosis present

## 2018-07-23 DIAGNOSIS — G4733 Obstructive sleep apnea (adult) (pediatric): Secondary | ICD-10-CM | POA: Diagnosis present

## 2018-07-23 DIAGNOSIS — Z9119 Patient's noncompliance with other medical treatment and regimen: Secondary | ICD-10-CM

## 2018-07-23 DIAGNOSIS — R0600 Dyspnea, unspecified: Secondary | ICD-10-CM

## 2018-07-23 DIAGNOSIS — I5042 Chronic combined systolic (congestive) and diastolic (congestive) heart failure: Secondary | ICD-10-CM | POA: Diagnosis present

## 2018-07-23 DIAGNOSIS — E785 Hyperlipidemia, unspecified: Secondary | ICD-10-CM | POA: Diagnosis present

## 2018-07-23 DIAGNOSIS — I9789 Other postprocedural complications and disorders of the circulatory system, not elsewhere classified: Secondary | ICD-10-CM | POA: Diagnosis not present

## 2018-07-23 DIAGNOSIS — D631 Anemia in chronic kidney disease: Secondary | ICD-10-CM | POA: Diagnosis present

## 2018-07-23 DIAGNOSIS — Z79899 Other long term (current) drug therapy: Secondary | ICD-10-CM

## 2018-07-23 DIAGNOSIS — M009 Pyogenic arthritis, unspecified: Secondary | ICD-10-CM | POA: Diagnosis present

## 2018-07-23 DIAGNOSIS — R5381 Other malaise: Secondary | ICD-10-CM | POA: Diagnosis not present

## 2018-07-23 DIAGNOSIS — N1832 Chronic kidney disease, stage 3b: Secondary | ICD-10-CM | POA: Diagnosis present

## 2018-07-23 DIAGNOSIS — X58XXXD Exposure to other specified factors, subsequent encounter: Secondary | ICD-10-CM | POA: Diagnosis present

## 2018-07-23 DIAGNOSIS — S76111A Strain of right quadriceps muscle, fascia and tendon, initial encounter: Secondary | ICD-10-CM | POA: Diagnosis present

## 2018-07-23 DIAGNOSIS — I959 Hypotension, unspecified: Secondary | ICD-10-CM | POA: Diagnosis not present

## 2018-07-23 DIAGNOSIS — J9622 Acute and chronic respiratory failure with hypercapnia: Secondary | ICD-10-CM | POA: Diagnosis not present

## 2018-07-23 DIAGNOSIS — E869 Volume depletion, unspecified: Secondary | ICD-10-CM | POA: Diagnosis not present

## 2018-07-23 DIAGNOSIS — Z6841 Body Mass Index (BMI) 40.0 and over, adult: Secondary | ICD-10-CM

## 2018-07-23 DIAGNOSIS — G4730 Sleep apnea, unspecified: Secondary | ICD-10-CM | POA: Diagnosis present

## 2018-07-23 DIAGNOSIS — Z823 Family history of stroke: Secondary | ICD-10-CM

## 2018-07-23 DIAGNOSIS — G9341 Metabolic encephalopathy: Secondary | ICD-10-CM | POA: Diagnosis not present

## 2018-07-23 DIAGNOSIS — N183 Chronic kidney disease, stage 3 unspecified: Secondary | ICD-10-CM | POA: Diagnosis present

## 2018-07-23 DIAGNOSIS — N990 Postprocedural (acute) (chronic) kidney failure: Secondary | ICD-10-CM | POA: Diagnosis not present

## 2018-07-23 DIAGNOSIS — I10 Essential (primary) hypertension: Secondary | ICD-10-CM

## 2018-07-23 DIAGNOSIS — Z7901 Long term (current) use of anticoagulants: Secondary | ICD-10-CM

## 2018-07-23 DIAGNOSIS — N529 Male erectile dysfunction, unspecified: Secondary | ICD-10-CM | POA: Diagnosis present

## 2018-07-23 DIAGNOSIS — G629 Polyneuropathy, unspecified: Secondary | ICD-10-CM | POA: Diagnosis present

## 2018-07-23 DIAGNOSIS — J9601 Acute respiratory failure with hypoxia: Secondary | ICD-10-CM

## 2018-07-23 DIAGNOSIS — I480 Paroxysmal atrial fibrillation: Secondary | ICD-10-CM | POA: Diagnosis present

## 2018-07-23 DIAGNOSIS — J9621 Acute and chronic respiratory failure with hypoxia: Secondary | ICD-10-CM | POA: Diagnosis not present

## 2018-07-23 DIAGNOSIS — I428 Other cardiomyopathies: Secondary | ICD-10-CM | POA: Diagnosis present

## 2018-07-23 DIAGNOSIS — Z886 Allergy status to analgesic agent status: Secondary | ICD-10-CM

## 2018-07-23 DIAGNOSIS — Z8249 Family history of ischemic heart disease and other diseases of the circulatory system: Secondary | ICD-10-CM

## 2018-07-23 HISTORY — PX: INCISION AND DRAINAGE OF WOUND: SHX1803

## 2018-07-23 HISTORY — DX: Infection following a procedure, other surgical site, initial encounter: T81.49XA

## 2018-07-23 HISTORY — DX: Presence of spectacles and contact lenses: Z97.3

## 2018-07-23 LAB — CBC
HCT: 41.4 % (ref 39.0–52.0)
Hemoglobin: 11.7 g/dL — ABNORMAL LOW (ref 13.0–17.0)
MCH: 26.1 pg (ref 26.0–34.0)
MCHC: 28.3 g/dL — ABNORMAL LOW (ref 30.0–36.0)
MCV: 92.2 fL (ref 80.0–100.0)
Platelets: 391 10*3/uL (ref 150–400)
RBC: 4.49 MIL/uL (ref 4.22–5.81)
RDW: 14.6 % (ref 11.5–15.5)
WBC: 6.4 10*3/uL (ref 4.0–10.5)
nRBC: 0.5 % — ABNORMAL HIGH (ref 0.0–0.2)

## 2018-07-23 LAB — BASIC METABOLIC PANEL
Anion gap: 12 (ref 5–15)
BUN: 17 mg/dL (ref 6–20)
CALCIUM: 8.6 mg/dL — AB (ref 8.9–10.3)
CO2: 36 mmol/L — ABNORMAL HIGH (ref 22–32)
Chloride: 95 mmol/L — ABNORMAL LOW (ref 98–111)
Creatinine, Ser: 1.56 mg/dL — ABNORMAL HIGH (ref 0.61–1.24)
GFR calc Af Amer: >60 mL/min (ref >60)
GFR calc non Af Amer: 56 mL/min — ABNORMAL LOW (ref >60)
Glucose, Bld: 105 mg/dL — ABNORMAL HIGH (ref 70–99)
Potassium: 3 mmol/L — ABNORMAL LOW (ref 3.5–5.1)
Sodium: 143 mmol/L (ref 135–145)

## 2018-07-23 LAB — PROTIME-INR
INR: 1.69
Prothrombin Time: 19.6 seconds — ABNORMAL HIGH (ref 11.4–15.2)

## 2018-07-23 SURGERY — IRRIGATION AND DEBRIDEMENT WOUND
Anesthesia: General | Laterality: Right

## 2018-07-23 MED ORDER — PROPOFOL 10 MG/ML IV BOLUS
INTRAVENOUS | Status: AC
  Filled 2018-07-23: qty 20

## 2018-07-23 MED ORDER — CARVEDILOL 3.125 MG PO TABS
3.1250 mg | ORAL_TABLET | Freq: Two times a day (BID) | ORAL | Status: DC
  Administered 2018-07-23 – 2018-07-28 (×10): 3.125 mg via ORAL
  Filled 2018-07-23 (×12): qty 1

## 2018-07-23 MED ORDER — PHENYLEPHRINE 40 MCG/ML (10ML) SYRINGE FOR IV PUSH (FOR BLOOD PRESSURE SUPPORT)
PREFILLED_SYRINGE | INTRAVENOUS | Status: DC | PRN
  Administered 2018-07-23 (×2): 80 ug via INTRAVENOUS
  Administered 2018-07-23: 40 ug via INTRAVENOUS
  Administered 2018-07-23: 80 ug via INTRAVENOUS
  Administered 2018-07-23: 40 ug via INTRAVENOUS
  Administered 2018-07-23 (×4): 80 ug via INTRAVENOUS

## 2018-07-23 MED ORDER — SODIUM CHLORIDE 0.9 % IR SOLN
Status: DC | PRN
  Administered 2018-07-23: 3000 mL

## 2018-07-23 MED ORDER — SUCCINYLCHOLINE CHLORIDE 200 MG/10ML IV SOSY
PREFILLED_SYRINGE | INTRAVENOUS | Status: AC
  Filled 2018-07-23: qty 10

## 2018-07-23 MED ORDER — ONDANSETRON HCL 4 MG/2ML IJ SOLN
INTRAMUSCULAR | Status: DC | PRN
  Administered 2018-07-23: 4 mg via INTRAVENOUS

## 2018-07-23 MED ORDER — ETOMIDATE 2 MG/ML IV SOLN
INTRAVENOUS | Status: AC
  Filled 2018-07-23: qty 10

## 2018-07-23 MED ORDER — LACTATED RINGERS IV SOLN
INTRAVENOUS | Status: DC
  Administered 2018-07-23: 14:00:00 via INTRAVENOUS

## 2018-07-23 MED ORDER — ISOSORBIDE MONONITRATE ER 60 MG PO TB24
60.0000 mg | ORAL_TABLET | Freq: Every day | ORAL | Status: DC
  Administered 2018-07-24 – 2018-07-30 (×6): 60 mg via ORAL
  Filled 2018-07-23 (×7): qty 1

## 2018-07-23 MED ORDER — CEFAZOLIN SODIUM-DEXTROSE 2-4 GM/100ML-% IV SOLN
2.0000 g | Freq: Three times a day (TID) | INTRAVENOUS | Status: DC
  Administered 2018-07-23 – 2018-07-30 (×21): 2 g via INTRAVENOUS
  Filled 2018-07-23 (×23): qty 100

## 2018-07-23 MED ORDER — MIDAZOLAM HCL 2 MG/2ML IJ SOLN
INTRAMUSCULAR | Status: AC
  Filled 2018-07-23: qty 2

## 2018-07-23 MED ORDER — LIDOCAINE 2% (20 MG/ML) 5 ML SYRINGE
INTRAMUSCULAR | Status: AC
  Filled 2018-07-23: qty 5

## 2018-07-23 MED ORDER — SUCCINYLCHOLINE CHLORIDE 200 MG/10ML IV SOSY
PREFILLED_SYRINGE | INTRAVENOUS | Status: DC | PRN
  Administered 2018-07-23: 120 mg via INTRAVENOUS

## 2018-07-23 MED ORDER — CHLORHEXIDINE GLUCONATE 4 % EX LIQD: 60.0000 mL | Freq: Once | CUTANEOUS | Status: DC

## 2018-07-23 MED ORDER — DOCUSATE SODIUM 100 MG PO CAPS
100.0000 mg | ORAL_CAPSULE | Freq: Two times a day (BID) | ORAL | Status: DC
  Administered 2018-07-23 – 2018-07-28 (×9): 100 mg via ORAL
  Filled 2018-07-23 (×12): qty 1

## 2018-07-23 MED ORDER — LOSARTAN POTASSIUM 25 MG PO TABS
25.0000 mg | ORAL_TABLET | Freq: Every evening | ORAL | Status: DC
  Administered 2018-07-23 – 2018-07-24 (×2): 25 mg via ORAL
  Filled 2018-07-23 (×2): qty 1

## 2018-07-23 MED ORDER — ONDANSETRON HCL 4 MG PO TABS: 4.0000 mg | ORAL_TABLET | Freq: Four times a day (QID) | ORAL | Status: DC | PRN

## 2018-07-23 MED ORDER — HYDROCODONE-ACETAMINOPHEN 7.5-325 MG PO TABS
1.0000 | ORAL_TABLET | ORAL | Status: DC | PRN
  Administered 2018-07-23: 1 via ORAL
  Administered 2018-07-24: 2 via ORAL
  Filled 2018-07-23 (×2): qty 2

## 2018-07-23 MED ORDER — DEXAMETHASONE SODIUM PHOSPHATE 10 MG/ML IJ SOLN
INTRAMUSCULAR | Status: AC
  Filled 2018-07-23: qty 1

## 2018-07-23 MED ORDER — HYDRALAZINE HCL 50 MG PO TABS
50.0000 mg | ORAL_TABLET | Freq: Three times a day (TID) | ORAL | Status: DC
  Administered 2018-07-23 – 2018-07-30 (×19): 50 mg via ORAL
  Filled 2018-07-23 (×20): qty 1

## 2018-07-23 MED ORDER — TORSEMIDE 20 MG PO TABS
20.0000 mg | ORAL_TABLET | Freq: Every day | ORAL | Status: DC
  Administered 2018-07-23 – 2018-07-24 (×2): 20 mg via ORAL
  Filled 2018-07-23 (×2): qty 1

## 2018-07-23 MED ORDER — FENTANYL CITRATE (PF) 250 MCG/5ML IJ SOLN
INTRAMUSCULAR | Status: AC
  Filled 2018-07-23: qty 5

## 2018-07-23 MED ORDER — AMIODARONE HCL 200 MG PO TABS
200.0000 mg | ORAL_TABLET | Freq: Two times a day (BID) | ORAL | Status: DC
  Administered 2018-07-23 – 2018-07-30 (×13): 200 mg via ORAL
  Filled 2018-07-23 (×14): qty 1

## 2018-07-23 MED ORDER — ONDANSETRON HCL 4 MG/2ML IJ SOLN: 4.0000 mg | Freq: Four times a day (QID) | INTRAMUSCULAR | Status: DC | PRN

## 2018-07-23 MED ORDER — ACETAMINOPHEN 10 MG/ML IV SOLN
1000.0000 mg | Freq: Once | INTRAVENOUS | Status: DC | PRN
  Administered 2018-07-23: 1000 mg via INTRAVENOUS

## 2018-07-23 MED ORDER — ORAL CARE MOUTH RINSE
15.0000 mL | Freq: Two times a day (BID) | OROMUCOSAL | Status: DC
  Administered 2018-07-24 – 2018-07-30 (×10): 15 mL via OROMUCOSAL

## 2018-07-23 MED ORDER — ONDANSETRON HCL 4 MG/2ML IJ SOLN
INTRAMUSCULAR | Status: AC
  Filled 2018-07-23: qty 2

## 2018-07-23 MED ORDER — DEXTROSE 5 % IV SOLN
INTRAVENOUS | Status: DC | PRN
  Administered 2018-07-23: 3 g via INTRAVENOUS

## 2018-07-23 MED ORDER — OXYCODONE HCL 5 MG/5ML PO SOLN: 5.0000 mg | Freq: Once | ORAL | Status: AC | PRN

## 2018-07-23 MED ORDER — CEFAZOLIN SODIUM 1 G IJ SOLR
INTRAMUSCULAR | Status: AC
  Filled 2018-07-23: qty 30

## 2018-07-23 MED ORDER — ACETAMINOPHEN 325 MG PO TABS
325.0000 mg | ORAL_TABLET | Freq: Four times a day (QID) | ORAL | Status: DC | PRN
  Administered 2018-07-27 – 2018-07-30 (×8): 650 mg via ORAL
  Filled 2018-07-23 (×10): qty 2

## 2018-07-23 MED ORDER — FENTANYL CITRATE (PF) 100 MCG/2ML IJ SOLN
INTRAMUSCULAR | Status: DC | PRN
  Administered 2018-07-23: 50 ug via INTRAVENOUS

## 2018-07-23 MED ORDER — WARFARIN SODIUM 2.5 MG PO TABS
3.7500 mg | ORAL_TABLET | ORAL | Status: DC
  Administered 2018-07-23: 3.75 mg via ORAL
  Filled 2018-07-23 (×2): qty 1

## 2018-07-23 MED ORDER — HYDROCODONE-ACETAMINOPHEN 5-325 MG PO TABS: 1.0000 | ORAL_TABLET | ORAL | Status: DC | PRN

## 2018-07-23 MED ORDER — FENTANYL CITRATE (PF) 100 MCG/2ML IJ SOLN: 25.0000 ug | INTRAMUSCULAR | Status: DC | PRN

## 2018-07-23 MED ORDER — POTASSIUM CHLORIDE ER 10 MEQ PO TBCR
20.0000 meq | EXTENDED_RELEASE_TABLET | Freq: Every day | ORAL | Status: DC | PRN
  Filled 2018-07-23: qty 2

## 2018-07-23 MED ORDER — ACETAMINOPHEN 160 MG/5ML PO SOLN: 1000.0000 mg | Freq: Once | ORAL | Status: DC | PRN

## 2018-07-23 MED ORDER — DEXAMETHASONE SODIUM PHOSPHATE 10 MG/ML IJ SOLN
INTRAMUSCULAR | Status: DC | PRN
  Administered 2018-07-23: 10 mg via INTRAVENOUS

## 2018-07-23 MED ORDER — OXYCODONE HCL 5 MG PO TABS
5.0000 mg | ORAL_TABLET | Freq: Once | ORAL | Status: AC | PRN
  Administered 2018-07-23: 5 mg via ORAL

## 2018-07-23 MED ORDER — ACETAMINOPHEN 10 MG/ML IV SOLN
INTRAVENOUS | Status: AC
  Filled 2018-07-23: qty 100

## 2018-07-23 MED ORDER — LIDOCAINE 2% (20 MG/ML) 5 ML SYRINGE
INTRAMUSCULAR | Status: DC | PRN
  Administered 2018-07-23: 80 mg via INTRAVENOUS

## 2018-07-23 MED ORDER — DIGOXIN 125 MCG PO TABS
0.2500 mg | ORAL_TABLET | Freq: Every day | ORAL | Status: DC
  Administered 2018-07-24 – 2018-07-30 (×6): 0.25 mg via ORAL
  Filled 2018-07-23: qty 2
  Filled 2018-07-23: qty 1
  Filled 2018-07-23 (×3): qty 2
  Filled 2018-07-23: qty 1
  Filled 2018-07-23 (×2): qty 2

## 2018-07-23 MED ORDER — ATORVASTATIN CALCIUM 40 MG PO TABS
40.0000 mg | ORAL_TABLET | Freq: Every day | ORAL | Status: DC
  Administered 2018-07-24 – 2018-07-30 (×6): 40 mg via ORAL
  Filled 2018-07-23 (×7): qty 1

## 2018-07-23 MED ORDER — WARFARIN SODIUM 7.5 MG PO TABS
7.5000 mg | ORAL_TABLET | ORAL | Status: DC
  Administered 2018-07-24: 7.5 mg via ORAL
  Filled 2018-07-23: qty 1

## 2018-07-23 MED ORDER — SPIRONOLACTONE 12.5 MG HALF TABLET
12.5000 mg | ORAL_TABLET | Freq: Every day | ORAL | Status: DC
  Administered 2018-07-23 – 2018-07-24 (×2): 12.5 mg via ORAL
  Filled 2018-07-23 (×5): qty 1

## 2018-07-23 MED ORDER — WARFARIN SODIUM 7.5 MG PO TABS: 7.5000 mg | ORAL_TABLET | ORAL | Status: DC

## 2018-07-23 MED ORDER — ETOMIDATE 2 MG/ML IV SOLN
INTRAVENOUS | Status: DC | PRN
  Administered 2018-07-23: 20 mg via INTRAVENOUS

## 2018-07-23 MED ORDER — ACETAMINOPHEN 500 MG PO TABS: 1000.0000 mg | ORAL_TABLET | Freq: Once | ORAL | Status: DC | PRN

## 2018-07-23 MED ORDER — MORPHINE SULFATE (PF) 2 MG/ML IV SOLN: 0.5000 mg | INTRAVENOUS | Status: DC | PRN

## 2018-07-23 MED ORDER — OXYCODONE HCL 5 MG PO TABS
ORAL_TABLET | ORAL | Status: AC
  Filled 2018-07-23: qty 1

## 2018-07-23 MED ORDER — SUCCINYLCHOLINE CHLORIDE 20 MG/ML IJ SOLN: INTRAMUSCULAR | Status: DC | PRN

## 2018-07-23 MED ORDER — ACETAMINOPHEN 500 MG PO TABS
500.0000 mg | ORAL_TABLET | Freq: Four times a day (QID) | ORAL | Status: AC
  Administered 2018-07-23 – 2018-07-24 (×3): 500 mg via ORAL
  Filled 2018-07-23 (×3): qty 1

## 2018-07-23 MED ORDER — TORSEMIDE 20 MG PO TABS
40.0000 mg | ORAL_TABLET | Freq: Every day | ORAL | Status: DC
  Administered 2018-07-24: 40 mg via ORAL
  Filled 2018-07-23: qty 2

## 2018-07-23 MED ORDER — WARFARIN - PHYSICIAN DOSING INPATIENT
Freq: Every day | Status: DC
  Administered 2018-07-24: 17:00:00

## 2018-07-23 MED ORDER — TORSEMIDE 20 MG PO TABS: 20.0000 mg | ORAL_TABLET | ORAL | Status: DC

## 2018-07-23 MED ORDER — GABAPENTIN 300 MG PO CAPS
300.0000 mg | ORAL_CAPSULE | Freq: Three times a day (TID) | ORAL | Status: DC
  Administered 2018-07-23 – 2018-07-27 (×11): 300 mg via ORAL
  Filled 2018-07-23 (×12): qty 1

## 2018-07-23 MED ORDER — MIDAZOLAM HCL 2 MG/2ML IJ SOLN
INTRAMUSCULAR | Status: DC | PRN
  Administered 2018-07-23: 2 mg via INTRAVENOUS

## 2018-07-23 MED ORDER — 0.9 % SODIUM CHLORIDE (POUR BTL) OPTIME
TOPICAL | Status: DC | PRN
  Administered 2018-07-23: 1000 mL

## 2018-07-23 MED ORDER — PHENYLEPHRINE 40 MCG/ML (10ML) SYRINGE FOR IV PUSH (FOR BLOOD PRESSURE SUPPORT)
PREFILLED_SYRINGE | INTRAVENOUS | Status: AC
  Filled 2018-07-23: qty 10

## 2018-07-23 SURGICAL SUPPLY — 74 items
ALCOHOL 70% 16 OZ (MISCELLANEOUS) IMPLANT
BANDAGE ACE 3X5.8 VEL STRL LF (GAUZE/BANDAGES/DRESSINGS) IMPLANT
BANDAGE ACE 6X5 VEL STRL LF (GAUZE/BANDAGES/DRESSINGS) ×3 IMPLANT
BLADE SURG 10 STRL SS (BLADE) ×3 IMPLANT
BNDG COHESIVE 1X5 TAN STRL LF (GAUZE/BANDAGES/DRESSINGS) IMPLANT
BNDG COHESIVE 4X5 TAN STRL (GAUZE/BANDAGES/DRESSINGS) ×3 IMPLANT
BNDG COHESIVE 6X5 TAN STRL LF (GAUZE/BANDAGES/DRESSINGS) ×6 IMPLANT
BNDG CONFORM 3 STRL LF (GAUZE/BANDAGES/DRESSINGS) IMPLANT
BNDG ELASTIC 6X10 VLCR STRL LF (GAUZE/BANDAGES/DRESSINGS) ×3 IMPLANT
BNDG GAUZE STRTCH 6 (GAUZE/BANDAGES/DRESSINGS) ×9 IMPLANT
CORDS BIPOLAR (ELECTRODE) IMPLANT
COVER SURGICAL LIGHT HANDLE (MISCELLANEOUS) ×3 IMPLANT
COVER WAND RF STERILE (DRAPES) ×3 IMPLANT
CUFF TOURNIQUET SINGLE 24IN (TOURNIQUET CUFF) IMPLANT
CUFF TOURNIQUET SINGLE 34IN LL (TOURNIQUET CUFF) IMPLANT
CUFF TOURNIQUET SINGLE 44IN (TOURNIQUET CUFF) IMPLANT
DRAIN JACKSON PRATT 10MM FLAT (MISCELLANEOUS) ×3 IMPLANT
DRAPE EXTREMITY BILATERAL (DRAPES) IMPLANT
DRAPE IMP U-DRAPE 54X76 (DRAPES) IMPLANT
DRAPE INCISE IOBAN 66X45 STRL (DRAPES) ×12 IMPLANT
DRAPE SURG 17X23 STRL (DRAPES) IMPLANT
DRAPE U-SHAPE 47X51 STRL (DRAPES) ×3 IMPLANT
DRSG ADAPTIC 3X8 NADH LF (GAUZE/BANDAGES/DRESSINGS) ×3 IMPLANT
DURAPREP 26ML APPLICATOR (WOUND CARE) ×6 IMPLANT
ELECT CAUTERY BLADE 6.4 (BLADE) ×3 IMPLANT
ELECT REM PT RETURN 9FT ADLT (ELECTROSURGICAL)
ELECTRODE REM PT RTRN 9FT ADLT (ELECTROSURGICAL) IMPLANT
EVACUATOR SILICONE 100CC (DRAIN) ×3 IMPLANT
FACESHIELD WRAPAROUND (MASK) IMPLANT
GAUZE SPONGE 4X4 12PLY STRL (GAUZE/BANDAGES/DRESSINGS) ×3 IMPLANT
GAUZE XEROFORM 1X8 LF (GAUZE/BANDAGES/DRESSINGS) ×3 IMPLANT
GAUZE XEROFORM 5X9 LF (GAUZE/BANDAGES/DRESSINGS) ×3 IMPLANT
GLOVE BIO SURGEON STRL SZ7.5 (GLOVE) ×3 IMPLANT
GLOVE BIOGEL PI IND STRL 8 (GLOVE) ×1 IMPLANT
GLOVE BIOGEL PI INDICATOR 8 (GLOVE) ×2
GOWN STRL REUS W/ TWL LRG LVL3 (GOWN DISPOSABLE) ×2 IMPLANT
GOWN STRL REUS W/ TWL XL LVL3 (GOWN DISPOSABLE) ×1 IMPLANT
GOWN STRL REUS W/TWL LRG LVL3 (GOWN DISPOSABLE) ×4
GOWN STRL REUS W/TWL XL LVL3 (GOWN DISPOSABLE) ×2
HANDPIECE INTERPULSE COAX TIP (DISPOSABLE)
KIT BASIN OR (CUSTOM PROCEDURE TRAY) ×3 IMPLANT
KIT TURNOVER KIT B (KITS) ×3 IMPLANT
MANIFOLD NEPTUNE II (INSTRUMENTS) ×3 IMPLANT
NS IRRIG 1000ML POUR BTL (IV SOLUTION) ×6 IMPLANT
PACK ORTHO EXTREMITY (CUSTOM PROCEDURE TRAY) ×3 IMPLANT
PAD ABD 8X10 STRL (GAUZE/BANDAGES/DRESSINGS) ×3 IMPLANT
PAD ARMBOARD 7.5X6 YLW CONV (MISCELLANEOUS) ×6 IMPLANT
PAD CAST 4YDX4 CTTN HI CHSV (CAST SUPPLIES) ×1 IMPLANT
PADDING CAST ABS 4INX4YD NS (CAST SUPPLIES) ×4
PADDING CAST ABS COTTON 4X4 ST (CAST SUPPLIES) ×2 IMPLANT
PADDING CAST COTTON 4X4 STRL (CAST SUPPLIES) ×2
PADDING CAST COTTON 6X4 STRL (CAST SUPPLIES) ×3 IMPLANT
SET HNDPC FAN SPRY TIP SCT (DISPOSABLE) IMPLANT
SPONGE LAP 18X18 X RAY DECT (DISPOSABLE) ×6 IMPLANT
STAPLER VISISTAT (STAPLE) ×6 IMPLANT
STOCKINETTE IMPERVIOUS 9X36 MD (GAUZE/BANDAGES/DRESSINGS) ×3 IMPLANT
STOCKINETTE IMPERVIOUS LG (DRAPES) ×3 IMPLANT
SUT ETHILON 2 0 FS 18 (SUTURE) IMPLANT
SUT ETHILON 2 0 PSLX (SUTURE) IMPLANT
SUT ETHILON 3 0 PS 1 (SUTURE) IMPLANT
SUT PDS AB 2-0 CT1 27 (SUTURE) ×9 IMPLANT
SUT VIC AB 2-0 CT1 36 (SUTURE) IMPLANT
SUT VIC AB 2-0 FS1 27 (SUTURE) IMPLANT
SWAB CULTURE ESWAB REG 1ML (MISCELLANEOUS) IMPLANT
SYR CONTROL 10ML LL (SYRINGE) IMPLANT
TOWEL OR 17X24 6PK STRL BLUE (TOWEL DISPOSABLE) ×3 IMPLANT
TOWEL OR 17X26 10 PK STRL BLUE (TOWEL DISPOSABLE) ×3 IMPLANT
TUBE CONNECTING 12'X1/4 (SUCTIONS) ×2
TUBE CONNECTING 12X1/4 (SUCTIONS) ×4 IMPLANT
TUBE FEEDING ENTERAL 5FR 16IN (TUBING) IMPLANT
TUBING CYSTO DISP (UROLOGICAL SUPPLIES) ×3 IMPLANT
UNDERPAD 30X30 (UNDERPADS AND DIAPERS) ×6 IMPLANT
WATER STERILE IRR 1000ML POUR (IV SOLUTION) IMPLANT
YANKAUER SUCT BULB TIP NO VENT (SUCTIONS) ×6 IMPLANT

## 2018-07-23 NOTE — Op Note (Signed)
Date of Surgery: 07/23/2018  INDICATIONS: Mark Baker is a 37 y.o.-year-old male with a right post operative knee infection; Mark Baker is now about 6 weeks out from quadriceps tendon repair performed by myself.  He was doing well until unfortunately he noted some drainage from his distal incision approximately 3 days ago.  He presented to my office with that chief complaint.  He was indicated for open irrigation and debridement.  The patient did consent to the procedure after discussion of the risks and benefits.  PREOPERATIVE DIAGNOSIS:  Right post operative knee infection.  POSTOPERATIVE DIAGNOSIS: Same.  PROCEDURE:  Excisional and non-excisional debridement of right postoperative knee infection including skin, subcutaneous tissue, muscle, fascia, and retained suture material.  SURGEON: Maryan Rued, M.D.  ASSIST: None.  ANESTHESIA:  general  IV FLUIDS AND URINE: See anesthesia.  ESTIMATED BLOOD LOSS: 50 mL.  IMPLANTS: None  DRAINS:  1 Jackson-Pratt drain sutured into the lateral thigh  COMPLICATIONS: None.  DESCRIPTION OF PROCEDURE: The patient was brought to the operating room and placed supine on the operating table.  The patient had been signed prior to the procedure and this was documented. The patient had the anesthesia placed by the anesthesiologist.  A time-out was performed to confirm that this was the correct patient, site, side and location. The patient did receive antibiotics prior to the incision and was re-dosed during the procedure as needed at indicated intervals.  A tourniquet was not placed.  The patient had the operative extremity prepped and draped in the standard surgical fashion.      The distal three fourths of the incision was opened in line with that incision.  Dissection was carried down through skin and subcutaneous tissue to the deep fascia.  We encountered abundant purulence at this level.  We did send this for deep culture.  Following cultures we then  began exploring the wound.  We encountered re-rupture of the quadriceps tendon with the lateral suture anchor dislodged from the patella.  This was debrided.  We also noted suture material had pulled through the quadriceps tendon and the medial suture anchor.  The retinaculum appeared to be intact medially and laterally.  We next began the excisional debridement utilizing rondure and knife.  All nonviable tissue was sharply excised in a meticulous fashion.  We then excised skin and subcutaneous tissue back to bleeding healthy tissue.  Following that we copiously irrigated the wound with 6 L of normal saline lavage.  Next placed a deep drain that exited through the lateral thigh and was then sutured into place with a 2-0 nylon.  We then began closure of the wound.  Deep layer was closed with 2-0 PDS.  Skin was closed with combination of 3-0 nylon and staples.  A standard sterile dressing was applied.  Patient was awoken from general anesthesia with no intraoperative complications and no issues.  He was transported to PACU in stable condition.  POSTOPERATIVE PLAN:  Mark Baker will be transferred to the floor where he can be weightbearing as tolerated to the right lower extremity.  We will have him resume his preoperative warfarin which will services DVT prophylaxis.  Will maintain his Jackson-Pratt drain until  daily output is less than 30 cc.  We will initiate him on IV antibiotics for the presumptive staph infection.

## 2018-07-23 NOTE — Progress Notes (Signed)
Pt new admit from PACU s/p excision and non-excision debridement of right postoperative knee infection with ace wrapped and right JP drain, with pulse 1+, able to wiggle his toes, warm to touch, with 02nc at 3 liters 90%, alert and oriented, withright hand peripheral IV line, he is due to void, no complain of pain at this time.

## 2018-07-23 NOTE — Anesthesia Procedure Notes (Signed)
Procedure Name: Intubation Date/Time: 07/23/2018 3:04 PM Performed by: Pearson Grippe, CRNA Pre-anesthesia Checklist: Patient identified, Emergency Drugs available, Suction available and Patient being monitored Patient Re-evaluated:Patient Re-evaluated prior to induction Oxygen Delivery Method: Circle system utilized Preoxygenation: Pre-oxygenation with 100% oxygen Induction Type: IV induction Ventilation: Mask ventilation without difficulty Laryngoscope Size: Glidescope and 4 Grade View: Grade I Tube type: Oral Number of attempts: 1 Airway Equipment and Method: Stylet and Oral airway Placement Confirmation: ETT inserted through vocal cords under direct vision,  positive ETCO2 and breath sounds checked- equal and bilateral Secured at: 23 cm Tube secured with: Tape Dental Injury: Teeth and Oropharynx as per pre-operative assessment  Difficulty Due To: Difficulty was anticipated Future Recommendations: Recommend- induction with short-acting agent, and alternative techniques readily available

## 2018-07-23 NOTE — Transfer of Care (Signed)
Immediate Anesthesia Transfer of Care Note  Patient: Mark Baker  Procedure(s) Performed: Right knee  incisional irrigation and debridement (Right )  Patient Location: PACU  Anesthesia Type:General  Level of Consciousness: awake, alert  and oriented  Airway & Oxygen Therapy: Patient Spontanous Breathing and Patient connected to face mask oxygen  Post-op Assessment: Report given to RN and Post -op Vital signs reviewed and stable  Post vital signs: Reviewed and stable  Last Vitals:  Vitals Value Taken Time  BP 131/106 07/23/2018  4:31 PM  Temp 36.7 C 07/23/2018  4:30 PM  Pulse 94 07/23/2018  4:31 PM  Resp 30 07/23/2018  4:31 PM  SpO2 92 % 07/23/2018  4:31 PM  Vitals shown include unvalidated device data.  Last Pain:  Vitals:   07/23/18 1307  TempSrc:   PainSc: 0-No pain      Patients Stated Pain Goal: 2 (07/23/18 1307)  Complications: No apparent anesthesia complications

## 2018-07-23 NOTE — Brief Op Note (Signed)
07/23/2018  4:47 PM  PATIENT:  Mark Baker  37 y.o. male  PRE-OPERATIVE DIAGNOSIS:  Right knee post op infection  POST-OPERATIVE DIAGNOSIS:  Right knee post operative infection  PROCEDURE:  Procedure(s): Right knee  incisional irrigation and debridement (Right)  SURGEON:  Surgeon(s) and Role:    * Yolonda Kida, MD - Primary  PHYSICIAN ASSISTANT:   ASSISTANTS: none   ANESTHESIA:   general  EBL:  50 mL   BLOOD ADMINISTERED:none  DRAINS: none   LOCAL MEDICATIONS USED:  NONE  SPECIMEN:  No Specimen  DISPOSITION OF SPECIMEN:  N/A  COUNTS:  YES  TOURNIQUET:  * Missing tourniquet times found for documented tourniquets in log: [id number] *  DICTATION: .Note written in EPIC  PLAN OF CARE: Admit for overnight observation  PATIENT DISPOSITION:  PACU - hemodynamically stable.   Delay start of Pharmacological VTE agent (>24hrs) due to surgical blood loss or risk of bleeding: not applicable

## 2018-07-23 NOTE — H&P (Signed)
ORTHOPAEDIC H and P  REQUESTING PHYSICIAN: Yolonda Kida, MD  PCP:  Sherren Mocha, MD  Chief Complaint: Right knee postoperative infection  HPI: Mark Baker is a 37 y.o. male who complains of increasing drainage from his right knee following a quadriceps tendon repair about 6 weeks ago.  He was doing well until about 1 week ago he noted some drainage coming from a small pinhole opening in the incision.  He denies any fevers, night sweats or chills.  Unfortunately when he was able to follow-up with me in the office I had concerns for postoperative infection with purulent discharge.  He presents today for irrigation and debridement of that wound.  Past Medical History:  Diagnosis Date  . Childhood asthma    no exacerbation since age 15   . Chronic combined systolic and diastolic CHF (congestive heart failure) (HCC)    a) ECHO (11/2012): EF 40-45%, RV fx difficult to see, nl size b) ECHO (05/2014): EF 20-25%, grade 2 DD, RV mildly dilated and sys fx mod reduced  . Hypertension   . Migraine    "down to maybe 2/year now; used to have them q other week" (11/26/2017)  . NICM (nonischemic cardiomyopathy) (HCC)    a) (11/2012): normal coronaries  . OSA on CPAP    wears CPAP  . Postoperative wound infection    right knee  . Wears glasses    Past Surgical History:  Procedure Laterality Date  . CARDIOVERSION N/A 11/07/2016   Procedure: CARDIOVERSION;  Surgeon: Dolores Patty, MD;  Location: [facility] OR;  Service: Cardiovascular;  Laterality: N/A;  Will need extra help positioning patient  . KNEE ARTHROSCOPY Right 2003   ANTERIOR CRUCIATE LIGAMENT REPAIR   . LEFT HEART CATHETERIZATION WITH CORONARY ANGIOGRAM N/A 11/24/2012   Procedure: LEFT HEART CATHETERIZATION WITH CORONARY ANGIOGRAM;  Surgeon: Peter M Swaziland, MD;  Location: [facility] CATH LAB;  Service: Cardiovascular;  Laterality: N/A;  . QUADRICEPS TENDON REPAIR Right 06/10/2018   Procedure: REPAIR QUADRICEP TENDON;  Surgeon: Yolonda Kida, MD;  Location: [facility] OR;  Service: Orthopedics;  Laterality: Right;  . RIGHT/LEFT HEART CATH AND CORONARY ANGIOGRAPHY N/A 03/18/2017   Procedure: Right/Left Heart Cath and Coronary Angiography;  Surgeon: Dolores Patty, MD;  Location: [facility] INVASIVE CV LAB;  Service: Cardiovascular;  Laterality: N/A;  . TEE WITHOUT CARDIOVERSION N/A 11/07/2016   Procedure: TRANSESOPHAGEAL ECHOCARDIOGRAM (TEE);  Surgeon: Dolores Patty, MD;  Location: [facility] OR;  Service: Cardiovascular;  Laterality: N/A;   Social History   Socioeconomic History  . Marital status: Married    Spouse name: Not on file  . Number of children: Not on file  . Years of education: Not on file  . Highest education level: Not on file  Occupational History  . Not on file  Social Needs  . Financial resource strain: Not on file  . Food insecurity:    Worry: Not on file    Inability: Not on file  . Transportation needs:    Medical: Not on file    Non-medical: Not on file  Tobacco Use  . Smoking status: Never Smoker  . Smokeless tobacco: Never Used  Substance and Sexual Activity  . Alcohol use: Not Currently    Alcohol/week: 0.0 standard drinks    Comment: 11/26/2017 "nothing since 07/22/18  . Drug use: Never  . 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   Lifestyle  . Physical activity:    Days  per week: Not on file    Minutes per session: Not on file  . Stress: Not on file  Relationships  . Social connections:    Talks on phone: Not on file    Gets together: Not on file    Attends religious service: Not on file    Active member of club or organization: Not on file    Attends meetings of clubs or organizations: Not on file    Relationship status: Not on file  Other Topics Concern  . Not on file  Social History Narrative   Lives in [place] with wife of 4 years.  Works for [employer] at [employer].  1 son and 2 daughters.     Denies cigarettes. Drank 1 beer last night 11/22/12. Denies drugs            Family History  Problem Relation Age of Onset  . Hypertension Father   . Heart failure Father        paternal side of family   . Hypertension Sister   . Stroke Maternal Grandmother   . Hypertension Unknown        maternal side of family   . Hypertension Mother   . Diabetes Neg Hx   . Hyperlipidemia Neg Hx    Allergies  Allergen Reactions  . Aspirin Hives, Itching and Rash  . Entresto [Sacubitril-Valsartan] Cough    Was on Entresto 07/2016-09/2016   Prior to Admission medications   Medication Sig Start Date End Date Taking? Authorizing Provider  amiodarone (PACERONE) 200 MG tablet Take 1 tablet (200 mg total) by mouth 2 (two) times daily. 04/20/18   Clegg, Amy D, NP  atorvastatin (LIPITOR) 40 MG tablet Take 1 tablet (40 mg total) by mouth daily. 04/20/18   Clegg, Amy D, NP  carvedilol (COREG) 3.125 MG tablet Take 1 tablet (3.125 mg total) by mouth 2 (two) times daily. 04/20/18   Clegg, Amy D, NP  digoxin (LANOXIN) 0.25 MG tablet Take 1 tablet (0.25 mg total) by mouth daily. 04/20/18   Clegg, Amy D, NP  hydrALAZINE (APRESOLINE) 50 MG tablet Take 1 tablet (50 mg total) by mouth every 8 (eight) hours. 04/20/18   Clegg, Amy D, NP  isosorbide mononitrate (IMDUR) 60 MG 24 hr tablet Take 1 tablet (60 mg total) by mouth daily. 04/20/18   Clegg, Amy D, NP  losartan (COZAAR) 25 MG tablet Take 1 tablet (25 mg total) by mouth daily. Patient taking differently: Take 25 mg by mouth every evening.  04/20/18   Clegg, Amy D, NP  oxyCODONE (ROXICODONE) 5 MG immediate release tablet Take 1-2 tablets (5-10 mg total) by mouth every 4 (four) hours as needed for moderate pain or severe pain. 06/11/18   Yolonda Kida, MD  Potassium Chloride ER 20 MEQ TBCR Take 20 mEq by mouth daily. 04/20/18   Clegg, Amy D, NP  sildenafil (VIAGRA) 100 MG tablet Take 1 tablet (100 mg total) by mouth daily as needed for erectile dysfunction. 04/20/18   Clegg, Amy D, NP  spironolactone (ALDACTONE) 25 MG tablet Take 0.5 tablets  (12.5 mg total) by mouth daily. 04/20/18   Clegg, Amy D, NP  torsemide (DEMADEX) 20 MG tablet Take 40 mg (2 Tablets) in the AM and 20 mg (1 Tablet) in the PM Patient taking differently: Take 20-40 mg by mouth See admin instructions. Take 40 mg (2 Tablets) in the AM and 20 mg (1 Tablet) in the PM 04/20/18   Sherald Hess, NP  warfarin (COUMADIN) 7.5 MG tablet Take 1 tablet (7.5 mg total) by mouth daily at 6 PM. Patient taking differently: Take 7.5 mg by mouth See admin instructions. Take 1 tablet (7.5 mg) by mouth on Mondays, Wednesdays, & Fridays at 6pm Take 0.5 tablet (3.75 mg) by mouth Sundays, Tuesdays, Thursdays, Saturdays at 6pm. 04/20/18   Clegg, Amy D, NP   No results found.  Positive ROS: All other systems have been reviewed and were otherwise negative with the exception of those mentioned in the HPI and as above.  Physical Exam: General: Alert, no acute distress Cardiovascular: No pedal edema Respiratory: No cyanosis, no use of accessory musculature GI: No organomegaly, abdomen is soft and non-tender Skin: No lesions in the area of chief complaint Neurologic: Sensation intact distally Psychiatric: Patient is competent for consent with normal mood and affect Lymphatic: No axillary or cervical lymphadenopathy    Assessment: 1.  Right knee postoperative wound infection  Plan: -Mr. Spurlock and I again reviewed the risk and benefits of moving forward with open irrigation and debridement of the right knee infection.  This is something that we are recommending on a semiurgent fashion given the risk of him becoming septic.  He currently is not so. -We discussed the risk of bleeding, infection, damage to surrounding neurovascular structures, persistent pain, persistent quadriceps tendon deficits, persistent need for antibiotics, pain, development of blood clots, and the risk of anesthesia.  He has provided informed consent.  He is holding Coumadin currently, and we will begin that postoperatively  as his DVT prophylaxis.  -We will plan to admit him postoperatively for intravenous antibiotics as well as routine monitoring and following up on his wound cultures.    Yolonda Kida, MD Cell [phone]    07/23/2018 12:17 PM

## 2018-07-24 ENCOUNTER — Encounter (HOSPITAL_COMMUNITY): Payer: Self-pay | Admitting: Orthopedic Surgery

## 2018-07-24 DIAGNOSIS — D62 Acute posthemorrhagic anemia: Secondary | ICD-10-CM | POA: Diagnosis not present

## 2018-07-24 DIAGNOSIS — Z79899 Other long term (current) drug therapy: Secondary | ICD-10-CM | POA: Diagnosis not present

## 2018-07-24 DIAGNOSIS — N529 Male erectile dysfunction, unspecified: Secondary | ICD-10-CM | POA: Diagnosis present

## 2018-07-24 DIAGNOSIS — Z823 Family history of stroke: Secondary | ICD-10-CM | POA: Diagnosis not present

## 2018-07-24 DIAGNOSIS — Z9981 Dependence on supplemental oxygen: Secondary | ICD-10-CM | POA: Diagnosis not present

## 2018-07-24 DIAGNOSIS — I428 Other cardiomyopathies: Secondary | ICD-10-CM | POA: Diagnosis present

## 2018-07-24 DIAGNOSIS — E669 Obesity, unspecified: Secondary | ICD-10-CM | POA: Diagnosis not present

## 2018-07-24 DIAGNOSIS — N183 Chronic kidney disease, stage 3 (moderate): Secondary | ICD-10-CM | POA: Diagnosis present

## 2018-07-24 DIAGNOSIS — I130 Hypertensive heart and chronic kidney disease with heart failure and stage 1 through stage 4 chronic kidney disease, or unspecified chronic kidney disease: Secondary | ICD-10-CM | POA: Diagnosis present

## 2018-07-24 DIAGNOSIS — I480 Paroxysmal atrial fibrillation: Secondary | ICD-10-CM | POA: Diagnosis present

## 2018-07-24 DIAGNOSIS — N170 Acute kidney failure with tubular necrosis: Secondary | ICD-10-CM | POA: Diagnosis not present

## 2018-07-24 DIAGNOSIS — E874 Mixed disorder of acid-base balance: Secondary | ICD-10-CM | POA: Diagnosis not present

## 2018-07-24 DIAGNOSIS — R5381 Other malaise: Secondary | ICD-10-CM | POA: Diagnosis not present

## 2018-07-24 DIAGNOSIS — I5043 Acute on chronic combined systolic (congestive) and diastolic (congestive) heart failure: Secondary | ICD-10-CM | POA: Diagnosis not present

## 2018-07-24 DIAGNOSIS — I370 Nonrheumatic pulmonary valve stenosis: Secondary | ICD-10-CM | POA: Diagnosis not present

## 2018-07-24 DIAGNOSIS — Z8249 Family history of ischemic heart disease and other diseases of the circulatory system: Secondary | ICD-10-CM | POA: Diagnosis not present

## 2018-07-24 DIAGNOSIS — I5022 Chronic systolic (congestive) heart failure: Secondary | ICD-10-CM | POA: Diagnosis not present

## 2018-07-24 DIAGNOSIS — M009 Pyogenic arthritis, unspecified: Secondary | ICD-10-CM | POA: Diagnosis not present

## 2018-07-24 DIAGNOSIS — J9601 Acute respiratory failure with hypoxia: Secondary | ICD-10-CM | POA: Diagnosis not present

## 2018-07-24 DIAGNOSIS — Z79891 Long term (current) use of opiate analgesic: Secondary | ICD-10-CM | POA: Diagnosis not present

## 2018-07-24 DIAGNOSIS — G4733 Obstructive sleep apnea (adult) (pediatric): Secondary | ICD-10-CM | POA: Diagnosis present

## 2018-07-24 DIAGNOSIS — I361 Nonrheumatic tricuspid (valve) insufficiency: Secondary | ICD-10-CM | POA: Diagnosis not present

## 2018-07-24 DIAGNOSIS — I9789 Other postprocedural complications and disorders of the circulatory system, not elsewhere classified: Secondary | ICD-10-CM | POA: Diagnosis not present

## 2018-07-24 DIAGNOSIS — T8141XA Infection following a procedure, superficial incisional surgical site, initial encounter: Secondary | ICD-10-CM | POA: Diagnosis present

## 2018-07-24 DIAGNOSIS — J9621 Acute and chronic respiratory failure with hypoxia: Secondary | ICD-10-CM | POA: Diagnosis not present

## 2018-07-24 DIAGNOSIS — Z888 Allergy status to other drugs, medicaments and biological substances status: Secondary | ICD-10-CM | POA: Diagnosis not present

## 2018-07-24 DIAGNOSIS — E785 Hyperlipidemia, unspecified: Secondary | ICD-10-CM | POA: Diagnosis present

## 2018-07-24 DIAGNOSIS — J9602 Acute respiratory failure with hypercapnia: Secondary | ICD-10-CM | POA: Diagnosis not present

## 2018-07-24 DIAGNOSIS — Z6841 Body Mass Index (BMI) 40.0 and over, adult: Secondary | ICD-10-CM | POA: Diagnosis not present

## 2018-07-24 DIAGNOSIS — S76111A Strain of right quadriceps muscle, fascia and tendon, initial encounter: Secondary | ICD-10-CM | POA: Diagnosis not present

## 2018-07-24 DIAGNOSIS — Z7901 Long term (current) use of anticoagulants: Secondary | ICD-10-CM | POA: Diagnosis not present

## 2018-07-24 DIAGNOSIS — I10 Essential (primary) hypertension: Secondary | ICD-10-CM | POA: Diagnosis not present

## 2018-07-24 DIAGNOSIS — X58XXXD Exposure to other specified factors, subsequent encounter: Secondary | ICD-10-CM | POA: Diagnosis present

## 2018-07-24 DIAGNOSIS — J9622 Acute and chronic respiratory failure with hypercapnia: Secondary | ICD-10-CM | POA: Diagnosis not present

## 2018-07-24 DIAGNOSIS — G9341 Metabolic encephalopathy: Secondary | ICD-10-CM | POA: Diagnosis not present

## 2018-07-24 DIAGNOSIS — N179 Acute kidney failure, unspecified: Secondary | ICD-10-CM | POA: Diagnosis not present

## 2018-07-24 DIAGNOSIS — Z886 Allergy status to analgesic agent status: Secondary | ICD-10-CM | POA: Diagnosis not present

## 2018-07-24 DIAGNOSIS — I5042 Chronic combined systolic (congestive) and diastolic (congestive) heart failure: Secondary | ICD-10-CM | POA: Diagnosis present

## 2018-07-24 DIAGNOSIS — J988 Other specified respiratory disorders: Secondary | ICD-10-CM | POA: Diagnosis not present

## 2018-07-24 MED ORDER — CEPHALEXIN 500 MG PO CAPS: 500.0000 mg | ORAL_CAPSULE | Freq: Four times a day (QID) | ORAL | 0 refills | Status: DC

## 2018-07-24 MED ORDER — HYDROCODONE-ACETAMINOPHEN 5-325 MG PO TABS: 1.0000 | ORAL_TABLET | ORAL | 0 refills | Status: DC | PRN

## 2018-07-24 NOTE — Progress Notes (Signed)
Offered to get patient a bariatric bed last night but he said he is "fine" in the bed he's in.

## 2018-07-24 NOTE — Plan of Care (Signed)
  Problem: Health Behavior/Discharge Planning: Goal: Ability to manage health-related needs will improve Outcome: Progressing   Problem: Clinical Measurements: Goal: Ability to maintain clinical measurements within normal limits will improve Outcome: Progressing Goal: Will remain free from infection Outcome: Progressing   Problem: Activity: Goal: Risk for activity intolerance will decrease Outcome: Progressing   

## 2018-07-24 NOTE — Progress Notes (Addendum)
   Subjective:  Patient reports pain as moderate.  Denies night sweats or chills.  He has been up in chair and is still on O2, but denies SOB, CP, N/V.  Pain well controlled  Objective:   VITALS:   Vitals:   07/24/18 0234 07/24/18 0519 07/24/18 1049 07/24/18 1451  BP: (!) 138/92 109/86 108/67 120/82  Pulse: 82 96 72 68  Resp: 16 16 (!) 22 18  Temp: 97.9 F (36.6 C) 97.7 F (36.5 C) 98 F (36.7 C) 97.6 F (36.4 C)  TempSrc: Oral Oral Oral Oral  SpO2: 94% 99% 99% 99%  Weight:      Height:        Neurologically intact Neurovascular intact Sensation intact distally Dorsiflexion/Plantar flexion intact Incision: dressing C/D/I and JP drain in place with serosanguinous drainage Compartment soft 2+ edema to mid tibia, symmetric with left   Lab Results  Component Value Date   WBC 6.4 07/23/2018   HGB 11.7 (L) 07/23/2018   HCT 41.4 07/23/2018   MCV 92.2 07/23/2018   PLT 391 07/23/2018   BMET    Component Value Date/Time   NA 143 07/23/2018 1329   NA 145 (H) 12/08/2017 1413   K 3.0 (L) 07/23/2018 1329   CL 95 (L) 07/23/2018 1329   CO2 36 (H) 07/23/2018 1329   GLUCOSE 105 (H) 07/23/2018 1329   BUN 17 07/23/2018 1329   BUN 11 12/08/2017 1413   CREATININE 1.56 (H) 07/23/2018 1329   CREATININE 1.45 (H) 01/18/2015 1045   CALCIUM 8.6 (L) 07/23/2018 1329   GFRNONAA 56 (L) 07/23/2018 1329   GFRNONAA 62 01/18/2015 1045   GFRAA >60 07/23/2018 1329   GFRAA 72 01/18/2015 1045     Assessment/Plan: 1 Day Post-Op   Principal Problem:   Infection of right knee (HCC)   Advance diet - WBAT with walker to RLE - maintain drain until tomorrow am - prn for pain - home meds for chronic medical morbidities - condom cath to be dc'd before dc home tomorrow - home hjealth PT order pended - cultures looking like staph, will treat with 4 weeks of oral keflex, will change prn once cultures final. - home warfarin dose for DVT ppx   Yolonda Kida 07/24/2018, 6:42  PM   Maryan Rued, MD [phone]

## 2018-07-24 NOTE — Anesthesia Postprocedure Evaluation (Signed)
Anesthesia Post Note  Patient: Mark Baker  Procedure(s) Performed: Right knee  incisional irrigation and debridement (Right )     Patient location during evaluation: PACU Anesthesia Type: General Level of consciousness: awake and alert Pain management: pain level controlled Vital Signs Assessment: post-procedure vital signs reviewed and stable Respiratory status: spontaneous breathing, nonlabored ventilation, respiratory function stable and patient connected to nasal cannula oxygen Cardiovascular status: blood pressure returned to baseline and stable Postop Assessment: no apparent nausea or vomiting Anesthetic complications: no    Last Vitals:  Vitals:   07/24/18 1049 07/24/18 1451  BP: 108/67 120/82  Pulse: 72 68  Resp: (!) 22 18  Temp: 36.7 C 36.4 C  SpO2: 99% 99%    Last Pain:  Vitals:   07/24/18 1451  TempSrc: Oral  PainSc:                  Dystany Duffy

## 2018-07-24 NOTE — Care Management Note (Signed)
Case Management Note  Patient Details  Name: Mark Baker MRN: [id number] Date of Birth: 11/04/1980  Subjective/Objective:                    Action/Plan: Patient recently hospitalized and discharged. Patient has [employer], which requires NCM to call referral for home health to [facility] and fax orders and clinicals. [facility] arranges home health and calls patient directly. Previous discharge CM made referral to [facility] on last discharge. Spoke with patient [facility] did not find a home health agency to take referral. Explained to patient NCM will follow for recommendations and orders, however, NCM will have to submit everything to [facility] again. Patient voiced understanding. Expected Discharge Date:  07/25/18               Expected Discharge Plan:     In-House Referral:     Discharge planning Services     Post Acute Care Choice:    Choice offered to:  Patient  DME Arranged:    DME Agency:     HH Arranged:    HH Agency:     Status of Service:  In process, will continue to follow  If discussed at Long Length of Stay Meetings, dates discussed:    Additional Comments:  Kingsley Plan, RN 07/24/2018, 1:11 PM

## 2018-07-24 NOTE — Plan of Care (Signed)

## 2018-07-24 NOTE — Discharge Instructions (Signed)
Ok for Weight bearing as tolerated with assistive device to the right leg Resume your warfarin as before surgery Maintain your post op bandage, and change daily with dry dressing until follow up appointment with Dr. [name] - keep your wound clean and dry at all times - for pain use tylenol and advil, then oxycodone for breakthrough pain.  Apply ice to the right knee     Information on my medicine - Coumadin   (Warfarin)  Why was Coumadin prescribed for you? Coumadin was prescribed for you because you have a blood clot or a medical condition that can cause an increased risk of forming blood clots. Blood clots can cause serious health problems by blocking the flow of blood to the heart, lung, or brain. Coumadin can prevent harmful blood clots from forming. As a reminder your indication for Coumadin is:   Stroke Prevention Because Of Atrial Fibrillation  What test will check on my response to Coumadin? While on Coumadin (warfarin) you will need to have an INR test regularly to ensure that your dose is keeping you in the desired range. The INR (international normalized ratio) number is calculated from the result of the laboratory test called prothrombin time (PT).  If an INR APPOINTMENT HAS NOT ALREADY BEEN MADE FOR YOU please schedule an appointment to have this lab work done by your health care provider within 7 days. Your INR goal is usually a number between:  2 to 3 or your provider may give you a more narrow range like 2-2.5.  Ask your health care provider during an office visit what your goal INR is.  What  do you need to  know  About  COUMADIN? Take Coumadin (warfarin) exactly as prescribed by your healthcare provider about the same time each day.  DO NOT stop taking without talking to the doctor who prescribed the medication.  Stopping without other blood clot prevention medication to take the place of Coumadin may increase your risk of developing a new clot or stroke.  Get refills before  you run out.  What do you do if you miss a dose? If you miss a dose, take it as soon as you remember on the same day then continue your regularly scheduled regimen the next day.  Do not take two doses of Coumadin at the same time.  Important Safety Information A possible side effect of Coumadin (Warfarin) is an increased risk of bleeding. You should call your healthcare provider right away if you experience any of the following: ? Bleeding from an injury or your nose that does not stop. ? Unusual colored urine (red or dark brown) or unusual colored stools (red or black). ? Unusual bruising for unknown reasons. ? A serious fall or if you hit your head (even if there is no bleeding).  Some foods or medicines interact with Coumadin (warfarin) and might alter your response to warfarin. To help avoid this: ? Eat a balanced diet, maintaining a consistent amount of Vitamin K. ? Notify your provider about major diet changes you plan to make. ? Avoid alcohol or limit your intake to 1 drink for women and 2 drinks for men per day. (1 drink is 5 oz. wine, 12 oz. beer, or 1.5 oz. liquor.)  Make sure that ANY health care provider who prescribes medication for you knows that you are taking Coumadin (warfarin).  Also make sure the healthcare provider who is monitoring your Coumadin knows when you have started a new medication including herbals and  non-prescription products.  Coumadin (Warfarin)  Major Drug Interactions  Increased Warfarin Effect Decreased Warfarin Effect  Alcohol (large quantities) Antibiotics (esp. Septra/Bactrim, Flagyl, Cipro) Amiodarone (Cordarone) Aspirin (ASA) Cimetidine (Tagamet) Megestrol (Megace) NSAIDs (ibuprofen, naproxen, etc.) Piroxicam (Feldene) Propafenone (Rythmol SR) Propranolol (Inderal) Isoniazid (INH) Posaconazole (Noxafil) Barbiturates (Phenobarbital) Carbamazepine (Tegretol) Chlordiazepoxide (Librium) Cholestyramine (Questran) Griseofulvin Oral  Contraceptives Rifampin Sucralfate (Carafate) Vitamin K   Coumadin (Warfarin) Major Herbal Interactions  Increased Warfarin Effect Decreased Warfarin Effect  Garlic Ginseng Ginkgo biloba Coenzyme Q10 Green tea St. Johns wort    Coumadin (Warfarin) FOOD Interactions  Eat a consistent number of servings per week of foods HIGH in Vitamin K (1 serving =  cup)  Collards (cooked, or boiled & drained) Kale (cooked, or boiled & drained) Mustard greens (cooked, or boiled & drained) Parsley *serving size only =  cup Spinach (cooked, or boiled & drained) Swiss chard (cooked, or boiled & drained) Turnip greens (cooked, or boiled & drained)  Eat a consistent number of servings per week of foods MEDIUM-HIGH in Vitamin K (1 serving = 1 cup)  Asparagus (cooked, or boiled & drained) Broccoli (cooked, boiled & drained, or raw & chopped) Brussel sprouts (cooked, or boiled & drained) *serving size only =  cup Lettuce, raw (green leaf, endive, romaine) Spinach, raw Turnip greens, raw & chopped   These websites have more information on Coumadin (warfarin):  http://www.king-russell.com/; https://www.hines.net/;

## 2018-07-25 ENCOUNTER — Inpatient Hospital Stay (HOSPITAL_COMMUNITY): Payer: Managed Care, Other (non HMO)

## 2018-07-25 DIAGNOSIS — J9601 Acute respiratory failure with hypoxia: Secondary | ICD-10-CM

## 2018-07-25 DIAGNOSIS — J9602 Acute respiratory failure with hypercapnia: Secondary | ICD-10-CM

## 2018-07-25 LAB — URINALYSIS, ROUTINE W REFLEX MICROSCOPIC
Bacteria, UA: NONE SEEN
Bilirubin Urine: NEGATIVE
Glucose, UA: NEGATIVE mg/dL
Hgb urine dipstick: NEGATIVE
Ketones, ur: NEGATIVE mg/dL
LEUKOCYTES UA: NEGATIVE
Nitrite: NEGATIVE
PH: 5 (ref 5.0–8.0)
Protein, ur: 100 mg/dL — AB
Specific Gravity, Urine: 1.015 (ref 1.005–1.030)

## 2018-07-25 LAB — BLOOD GAS, ARTERIAL
ACID-BASE EXCESS: 8.5 mmol/L — AB (ref 0.0–2.0)
ACID-BASE EXCESS: 8.2 mmol/L — AB (ref 0.0–2.0)
Acid-Base Excess: 7.4 mmol/L — ABNORMAL HIGH (ref 0.0–2.0)
Acid-Base Excess: 8.2 mmol/L — ABNORMAL HIGH (ref 0.0–2.0)
Acid-Base Excess: 7.9 mmol/L — ABNORMAL HIGH (ref 0.0–2.0)
BICARBONATE: 35.5 mmol/L — AB (ref 20.0–28.0)
Bicarbonate: 35.1 mmol/L — ABNORMAL HIGH (ref 20.0–28.0)
Bicarbonate: 35.4 mmol/L — ABNORMAL HIGH (ref 20.0–28.0)
Bicarbonate: 34.8 mmol/L — ABNORMAL HIGH (ref 20.0–28.0)
Bicarbonate: 34.9 mmol/L — ABNORMAL HIGH (ref 20.0–28.0)
DRAWN BY: 105521
Delivery systems: POSITIVE
Delivery systems: POSITIVE
Delivery systems: POSITIVE
Delivery systems: POSITIVE
Drawn by: 441371
Drawn by: 270221
Drawn by: 10552
Drawn by: 347191
Expiratory PAP: 8
Expiratory PAP: 8
Expiratory PAP: 8
Expiratory PAP: 12
FIO2: 40
FIO2: 0.4
FIO2: 40
FIO2: 0.4
Inspiratory PAP: 20
Inspiratory PAP: 20
Inspiratory PAP: 20
Inspiratory PAP: 22
O2 Content: 5 L/min
O2 Saturation: 93.3 %
O2 Saturation: 94.6 %
O2 Saturation: 95.2 %
O2 Saturation: 95.7 %
O2 Saturation: 96.6 %
PATIENT TEMPERATURE: 97.7
PH ART: 7.255 — AB (ref 7.350–7.450)
PO2 ART: 79.1 mmHg — AB (ref 83.0–108.0)
Patient temperature: 98.6
Patient temperature: 98.6
Patient temperature: 98.6
Patient temperature: 98.6
RATE: 18 resp/min
RATE: 20 resp/min
pCO2 arterial: 89.5 mmHg (ref 32.0–48.0)
pCO2 arterial: 84.7 mmHg (ref 32.0–48.0)
pCO2 arterial: 82.9 mmHg (ref 32.0–48.0)
pCO2 arterial: 81.2 mmHg (ref 32.0–48.0)
pCO2 arterial: 77 mmHg (ref 32.0–48.0)
pH, Arterial: 7.214 — ABNORMAL LOW (ref 7.350–7.450)
pH, Arterial: 7.244 — ABNORMAL LOW (ref 7.350–7.450)
pH, Arterial: 7.255 — ABNORMAL LOW (ref 7.350–7.450)
pH, Arterial: 7.278 — ABNORMAL LOW (ref 7.350–7.450)
pO2, Arterial: 83.4 mmHg (ref 83.0–108.0)
pO2, Arterial: 85.1 mmHg (ref 83.0–108.0)
pO2, Arterial: 87.6 mmHg (ref 83.0–108.0)
pO2, Arterial: 96.9 mmHg (ref 83.0–108.0)

## 2018-07-25 LAB — BRAIN NATRIURETIC PEPTIDE: B Natriuretic Peptide: 921.9 pg/mL — ABNORMAL HIGH (ref 0.0–100.0)

## 2018-07-25 LAB — PROTIME-INR
INR: 1.9
Prothrombin Time: 21.6 seconds — ABNORMAL HIGH (ref 11.4–15.2)

## 2018-07-25 MED ORDER — MAGNESIUM OXIDE 400 (241.3 MG) MG PO TABS: 800.0000 mg | ORAL_TABLET | Freq: Once | ORAL | Status: DC

## 2018-07-25 MED ORDER — MAGNESIUM OXIDE 400 (241.3 MG) MG PO TABS
800.0000 mg | ORAL_TABLET | Freq: Once | ORAL | Status: DC
  Filled 2018-07-25: qty 2

## 2018-07-25 MED ORDER — POTASSIUM CHLORIDE CRYS ER 20 MEQ PO TBCR: 40.0000 meq | EXTENDED_RELEASE_TABLET | Freq: Once | ORAL | Status: DC

## 2018-07-25 MED ORDER — POTASSIUM CHLORIDE CRYS ER 20 MEQ PO TBCR
40.0000 meq | EXTENDED_RELEASE_TABLET | Freq: Once | ORAL | Status: DC
  Filled 2018-07-25: qty 2

## 2018-07-25 MED ORDER — POTASSIUM CHLORIDE 10 MEQ/100ML IV SOLN
10.0000 meq | INTRAVENOUS | Status: AC
  Administered 2018-07-25 (×4): 10 meq via INTRAVENOUS
  Filled 2018-07-25 (×4): qty 100

## 2018-07-25 MED ORDER — FUROSEMIDE 10 MG/ML IJ SOLN
80.0000 mg | Freq: Once | INTRAMUSCULAR | Status: AC
  Administered 2018-07-25: 80 mg via INTRAVENOUS
  Filled 2018-07-25: qty 8

## 2018-07-25 MED ORDER — NALOXONE HCL 0.4 MG/ML IJ SOLN
0.4000 mg | INTRAMUSCULAR | Status: DC | PRN
  Administered 2018-07-25: 0.4 mg via INTRAVENOUS

## 2018-07-25 MED ORDER — ACETAZOLAMIDE 250 MG PO TABS
250.0000 mg | ORAL_TABLET | Freq: Once | ORAL | Status: AC
  Administered 2018-07-25: 250 mg via ORAL
  Filled 2018-07-25: qty 1

## 2018-07-25 MED ORDER — WARFARIN - PHARMACIST DOSING INPATIENT
Freq: Every day | Status: DC
  Administered 2018-07-26: 17:00:00

## 2018-07-25 MED ORDER — WARFARIN SODIUM 2.5 MG PO TABS
3.7500 mg | ORAL_TABLET | Freq: Once | ORAL | Status: DC
  Filled 2018-07-25: qty 1

## 2018-07-25 MED ORDER — NALOXONE HCL 0.4 MG/ML IJ SOLN
0.4000 mg | INTRAMUSCULAR | Status: DC | PRN
  Filled 2018-07-25: qty 1

## 2018-07-25 NOTE — Progress Notes (Signed)
Notified provider Dr, Stephania Fragmin, that patient still had AMS and had foul smelling urine obtained by in and cath.  ~500 ml of urine was obtained by in-and-out cath performed by Tressia Miners, RN with 2nd nurse Luella Cook, RN. Urine was obtained in sterile specimen cup.  Per verbal order with read back the following orders were obtained: bladder scan q6h, urine culture, urine analysis, and ABG at 1800.

## 2018-07-25 NOTE — Progress Notes (Addendum)
Left a voicemail to ex wife Jarrod Hibdon) regarding the transfer.

## 2018-07-25 NOTE — Progress Notes (Addendum)
Patient bladder scanned by Byrd Hesselbach, NT, volume >617 ml. Per verbal order with read back by provider, Dr. Stephania Fragmin, MD, straight in/out cath once performed.

## 2018-07-25 NOTE — Progress Notes (Signed)
Spoke to nursing staff about ABG results. I paged [facility] hospitalist, Dr. Nelson Chimes at 10:07 and still awaiting call back.

## 2018-07-25 NOTE — Progress Notes (Addendum)
0955 ABG results in per RT  , results called to MD. Rapid response called.

## 2018-07-25 NOTE — Progress Notes (Signed)
CRITICAL VALUE ALERT  Critical Value:  ABG: 1800 pH 7.26, CO2 81, pO2 88, Bicarb 35, base deficit -8, sat 96  Date & Time Notied:  07/25/18 1817  Provider Notified: 07/25/18 1817  Orders Received/Actions taken: Provider made aware.

## 2018-07-25 NOTE — Progress Notes (Signed)
CRITICAL VALUE ALERT  Critical Value:  ABG (pH 7.255, CO2 82.9, O2 85, Bicarb 35.5)  Date & Time Notied:  07/25/18 1459  Provider Notified: 07/25/18 1502  Orders Received/Actions taken: Provider made aware.

## 2018-07-25 NOTE — Progress Notes (Signed)
Patient oxygen saturation fluctuating from 75-90's BP 114/72 P 64. Respiratory made aware of patient saturation. Md paged for update. Awaiting call at this time.

## 2018-07-25 NOTE — Progress Notes (Signed)
Patient sats above 95 on 5 L during evening until patient removed O2, "I feel fine." Educated patient but he did not wish to replace at this time. Helped patient move to bed from chair at 0115, pt became winded and weak, unable to pull self up in the bed. Placed O2 until patient removed himself early am.   Vitals taken at 0550 and sats were in 80s with cpap on, replaced with nasal cannula, sats back into low 90s while sleeping - removed pillow and reclined bed to open airway further.

## 2018-07-25 NOTE — Progress Notes (Signed)
Patient received from nurse Vee (6N) at 1353 Patient on Bipap and initial only oriented to self.  After being on unit ~30-40 min patient was able to state name, year, and place, but remains lethargic and sleepy.  Will continue to monitor mental status.

## 2018-07-25 NOTE — Progress Notes (Signed)
Pt placed on Bipap following ABG critical values.  Rapid Response RN at bedside.

## 2018-07-25 NOTE — Progress Notes (Signed)
   Subjective: 2 Days Post-Op Procedure(s) (LRB): Right knee  incisional irrigation and debridement (Right)  Pt resting soundly this morning due to being up late last night Noted decreased sats because he is mainly a mouth breather Denies any new symptoms or issues Patient reports pain as mild.  Objective:   VITALS:   Vitals:   07/24/18 2118 07/25/18 0553  BP: (!) 120/96 98/80  Pulse: 77 83  Resp: 16 16  Temp: 98.1 F (36.7 C) 97.7 F (36.5 C)  SpO2: 100% 93%    Right knee dressing intact Drain clipped and removed nv intact distally Moderate pedal edema bilaterally  LABS Recent Labs    07/23/18 1329  HGB 11.7*  HCT 41.4  WBC 6.4  PLT 391    Recent Labs    07/23/18 1329  NA 143  K 3.0*  BUN 17  CREATININE 1.56*  GLUCOSE 105*     Assessment/Plan: 2 Days Post-Op Procedure(s) (LRB): Right knee  incisional irrigation and debridement (Right) D/c home today Antibiotics have been sent to his pharmacy F/u with Dr. Aundria Rud in 2 weeks    Alphonsa Overall PA-C, MPAS [facility] is now [facility]  [place] [address], [address], [place], [place] [address] Phone: [phone] [email or web address] Facebook  [employer]

## 2018-07-25 NOTE — Significant Event (Signed)
Rapid Response Event Note  Overview:  Called with abnormal ABG results in patient with lethargy Time Called: 1014 Arrival Time: 1017 Event Type: Respiratory  Initial Focused Assessment:  Arouses to loud voice - MAE x 4 = follows commands - oriented - warm and dry - falls to sleep while talking.  BIl BS very decreased - large body habitus.  Abd large soft.  Right knee DDI - legs edeamtous.  Strong cough on command. Bp 117/82 HR 84 RR 18 shallow.  O2 sats 100% on NRB mask -NRB taken off - has only been on this for about 10 mintues per RN at bedside.     Interventions:  Placed on Bipap per RRT protocol [name] PA aware - for [facility] consult.  Bipap adjusted by RT Fannie Knee - patient tol at this time.  Placed on telemetry. Labs drawn.  Dr. Nelson Chimes to bedside - update given.  Remains sleepy - BP 99/71 HR 74 SR RR 21 on BIpap - PXCR done.  1140 Repeat ABG - paged Dr. Nelson Chimes.  Seems less arousable - removed Bipap mask - will now interact and follow simple commands including cough and sticking out tongue - protecting airway - placed back on Bipap.  Transferred to 8B15 with Bipap - handoff to Susa Raring - spoke with Dr. Nelson Chimes with update.  Patient now following commands and responding to voice but remains sleepy.   Will follow next ABG.  RN to call as needed.   Plan of Care (if not transferred):  Event Summary: Name of Physician Notified: Stevenson Clinch PA at (pta RRT)    at  Transferred to 4E02 at 1230.         Delton Prairie

## 2018-07-25 NOTE — Progress Notes (Signed)
Patient still has AMS.  Will follow commands such as look to the left, look to the right, raise left and right hand.  Pt not alert, time, or situation.  Notified provider of current mental status and recent 1800 ABG.  Will continue to monitor.

## 2018-07-25 NOTE — Progress Notes (Signed)
Personal belongings brought to security office.

## 2018-07-25 NOTE — Progress Notes (Signed)
PT Cancellation Note  Patient Details Name: Lunden Moberly MRN: [id number] DOB: Nov 02, 1980   Cancelled Treatment:    Reason Eval/Treat Not Completed: Patient not medically ready  Spoke with RN. Abnormal ABG Labs, patient moving to higher level of care. Lethargic, difficult to arouse when PT entered room.   Berton Mount 07/25/2018, 10:06 AM  Charlsie Merles, PT, DPT

## 2018-07-25 NOTE — Progress Notes (Addendum)
Patient still unable to tolerate po medications due to AMS.  Provider (Dr. Stephania Fragmin, MD) made aware and updated, pending 1800 ABG.

## 2018-07-25 NOTE — Progress Notes (Signed)
Per verbal order with read back (Dr. Zadie Rhine, MD)  changed 40 mEq po potassium order to 4 runs of 10 meq IV potassium, due to AMS of patient with patient not alert enough to tolerate po medications.

## 2018-07-25 NOTE — Progress Notes (Signed)
Nurse informed me about the updated ABG. Not much improvement despite increase in rate and BiPAP changes. Patient has put about about 500cc urine since lasix early. He had to have a straight cath. I Will also give him Narcan (althought no narcotics in >20 hrs). Patient does wake up with verbal commands and answer basic questions but doesn't stay awake long enough.  I spoke with Dr Delton Coombes from [facility], he recommended trial of Diamox with lasix, increase IPAP/EPAP 22/12, RR can continue around 20-22. Repeat ABG at 9pm.  If no improvement, PCCM would formally consult for further assistance.  Appreciate input by Dr Delton Coombes.   Nurse notified with the plan. I will sign this out to the night team as well after 7pm.  In the meantime call with further questions.   Stephania Fragmin MD [facility]

## 2018-07-25 NOTE — Progress Notes (Signed)
Rapid Response called this morning. Pt placed on Bipap this am, as noted. Pt transported to 4E02 via  Bipap. Report given to [facility] RRT RCP.

## 2018-07-25 NOTE — Progress Notes (Signed)
ANTICOAGULATION CONSULT NOTE - Initial Consult  Pharmacy Consult for warfarin Indication: atrial fibrillation  Allergies  Allergen Reactions  . Aspirin Hives, Itching and Rash  . Entresto [Sacubitril-Valsartan] Cough    Was on Entresto 07/2016-09/2016    Patient Measurements: Height: 6\' 3"  (190.5 cm) Weight: (unable to get pt's weight/ bed transfer ) IBW/kg (Calculated) : 84.5   Vital Signs: Temp: 98.8 F (37.1 C) (12/14 1003) Temp Source: Axillary (12/14 1253) BP: 119/75 (12/14 1253) Pulse Rate: 77 (12/14 1253)  Labs: Recent Labs    07/23/18 1329 07/25/18 0504  HGB 11.7*  --   HCT 41.4  --   PLT 391  --   LABPROT 19.6* 21.6*  INR 1.69 1.90  CREATININE 1.56*  --     Estimated Creatinine Clearance: 132.7 mL/min (A) (by C-G formula based on SCr of 1.56 mg/dL (H)).   Medical History: Past Medical History:  Diagnosis Date  . Childhood asthma    no exacerbation since age 43   . Chronic combined systolic and diastolic CHF (congestive heart failure) (HCC)    a) ECHO (11/2012): EF 40-45%, RV fx difficult to see, nl size b) ECHO (05/2014): EF 20-25%, grade 2 DD, RV mildly dilated and sys fx mod reduced  . Hypertension   . Migraine    "down to maybe 2/year now; used to have them q other week" (11/26/2017)  . NICM (nonischemic cardiomyopathy) (HCC)    a) (11/2012): normal coronaries  . OSA on CPAP    wears CPAP  . Postoperative wound infection    right knee  . Wears glasses      Assessment: 37 yoM with morbidy obesity and AFib admitted for R knee I&D. Pt on warfarin PTA initially resumed by surgical MD with plans for discharge today on home regimen. Pt decompensated with respiratory insufficiency requiring BiPAP and will remain admitted with hospitalist service, pharmacy to dose warfarin. INR 1.90 today after resuming 12/12 - will attempt to simply resume home regimen.  *PTA Dose = 7.5mg  Mon/Wed/Fri, 3.75mg  all other days  Goal of Therapy:  INR 2-3 Monitor  platelets by anticoagulation protocol: Yes   Plan:  -Warfarin 3.75mg  PO x1 tonight -Daily protime  Fredonia Highland, PharmD, BCPS Clinical Pharmacist [phone] Please check AMION for all [facility] numbers 07/25/2018

## 2018-07-25 NOTE — Progress Notes (Signed)
Pt refusing CPAP at this time.

## 2018-07-25 NOTE — Consult Note (Addendum)
Initial consultation   Aniv Gell PRX:[id number] DOB: 05-Aug-1981 DOA: 07/23/2018  PCP: Sherren Mocha, MD Consulted by orthopedic  Reason for consultation-shortness of breath  HPI: Mark Baker is a 37 y.o. 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  Obesity with BMI greater than 60, paroxysmal atrial fibrillation was admitted to the hospital with increasing drainage from the right knee following quadricep tendon repair about 6 weeks ago.  Patient underwent excisional debridement of the right knee which he tolerated well but the following day he developed shortness of breath and was very drowsy.  Patient was offered CPAP yesterday night but he refused. This morning because he was drowsy ABG was done which showed PCO2 greater than 85, pH of 7.2.  Hospitalist team was consulted for the management of his respiratory distress. When I arrived at bedside patient was arousable but would not stay awake enough to carry on a conversation.  Vital signs were overall stable.   Review of Systems: As per HPI otherwise 10 point review of systems negative.  Difficult to obtain full review of system due to his mentation.  Past Medical History:  Diagnosis Date  . Childhood asthma    no exacerbation since age 37   . Chronic combined systolic and diastolic CHF (congestive heart failure) (HCC)    a) ECHO (11/2012): EF 40-45%, RV fx difficult to see, nl size b) ECHO (05/2014): EF 20-25%, grade 2 DD, RV mildly dilated and sys fx mod reduced  . Hypertension   . Migraine    "down to maybe 2/year now; used to have them q other week" (11/26/2017)  . NICM (nonischemic cardiomyopathy) (HCC)    a) (11/2012): normal coronaries  . OSA on CPAP    wears CPAP  . Postoperative wound infection    right knee  . Wears glasses     Past Surgical History:  Procedure  Laterality Date  . CARDIOVERSION N/A 11/07/2016   Procedure: CARDIOVERSION;  Surgeon: Dolores Patty, MD;  Location: [facility] OR;  Service: Cardiovascular;  Laterality: N/A;  Will need extra help positioning patient  . INCISION AND DRAINAGE OF WOUND Right 07/23/2018   Procedure: Right knee  incisional irrigation and debridement;  Surgeon: Yolonda Kida, MD;  Location: [facility] OR;  Service: Orthopedics;  Laterality: Right;  . KNEE ARTHROSCOPY Right 2003   ANTERIOR CRUCIATE LIGAMENT REPAIR   . LEFT HEART CATHETERIZATION WITH CORONARY ANGIOGRAM N/A 11/24/2012   Procedure: LEFT HEART CATHETERIZATION WITH CORONARY ANGIOGRAM;  Surgeon: Peter M Swaziland, MD;  Location[facility] CATH LAB;  Service: Cardiovascular;  Laterality: N/A;  . QUADRICEPS TENDON REPAIR Right 06/10/2018   Procedure: REPAIR QUADRICEP TENDON;  Surgeon: Yolonda Kida, MD;  Location: [facility] OR;  Service: Orthopedics;  Laterality: Right;  . RIGHT/LEFT HEART CATH AND CORONARY ANGIOGRAPHY N/A 03/18/2017   Procedure: Right/Left Heart Cath and Coronary Angiography;  Surgeon: Dolores Patty, MD;  Location: [facility] INVASIVE CV LAB;  Service: Cardiovascular;  Laterality: N/A;  . TEE WITHOUT CARDIOVERSION N/A 11/07/2016   Procedure: TRANSESOPHAGEAL ECHOCARDIOGRAM (TEE);  Surgeon: Dolores Patty, MD;  Location: [facility] OR;  Service: Cardiovascular;  Laterality: N/A;    SOCIAL HISTORY:  reports that he has never smoked. He has never used smokeless tobacco. He reports previous alcohol use. He reports that he does not use drugs.  Allergies  Allergen Reactions  . Aspirin Hives, Itching and Rash  . Entresto [Sacubitril-Valsartan] Cough  Was on Entresto 07/2016-09/2016    FAMILY HISTORY: Family History  Problem Relation Age of Onset  . Hypertension Father   . Heart failure Father        paternal side of family   . Hypertension Sister   . Stroke Maternal Grandmother   . Hypertension Other        maternal side of family   . Hypertension Mother    . Diabetes Neg Hx   . Hyperlipidemia Neg Hx      Prior to Admission medications   Medication Sig Start Date End Date Taking? Authorizing Provider  acetaminophen (TYLENOL) 500 MG tablet Take 1,000 mg by mouth as needed for mild pain or headache.   Yes [provider]  amiodarone (PACERONE) 200 MG tablet Take 1 tablet (200 mg total) by mouth 2 (two) times daily. 04/20/18  Yes Clegg, Amy D, NP  atorvastatin (LIPITOR) 40 MG tablet Take 1 tablet (40 mg total) by mouth daily. 04/20/18  Yes Clegg, Amy D, NP  carvedilol (COREG) 3.125 MG tablet Take 1 tablet (3.125 mg total) by mouth 2 (two) times daily. 04/20/18  Yes Clegg, Amy D, NP  cephALEXin (KEFLEX) 500 MG capsule Take 500 mg by mouth 4 (four) times daily.   Yes [provider]  digoxin (LANOXIN) 0.25 MG tablet Take 1 tablet (0.25 mg total) by mouth daily. 04/20/18  Yes Clegg, Amy D, NP  hydrALAZINE (APRESOLINE) 50 MG tablet Take 1 tablet (50 mg total) by mouth every 8 (eight) hours. 04/20/18  Yes Clegg, Amy D, NP  isosorbide mononitrate (IMDUR) 60 MG 24 hr tablet Take 1 tablet (60 mg total) by mouth daily. 04/20/18  Yes Clegg, Amy D, NP  losartan (COZAAR) 25 MG tablet Take 1 tablet (25 mg total) by mouth daily. Patient taking differently: Take 25 mg by mouth every evening.  04/20/18  Yes Clegg, Amy D, NP  Potassium Chloride ER 20 MEQ TBCR Take 20 mEq by mouth daily. Patient taking differently: Take 20 mEq by mouth daily as needed (cramping).  04/20/18  Yes Clegg, Amy D, NP  sildenafil (VIAGRA) 100 MG tablet Take 1 tablet (100 mg total) by mouth daily as needed for erectile dysfunction. 04/20/18  Yes Clegg, Amy D, NP  spironolactone (ALDACTONE) 25 MG tablet Take 0.5 tablets (12.5 mg total) by mouth daily. 04/20/18  Yes Clegg, Amy D, NP  torsemide (DEMADEX) 20 MG tablet Take 40 mg (2 Tablets) in the AM and 20 mg (1 Tablet) in the PM Patient taking differently: Take 20-40 mg by mouth See admin instructions. Take 40 mg (2 Tablets) in the AM and 20 mg  (1 Tablet) in the PM 04/20/18  Yes Clegg, Amy D, NP  warfarin (COUMADIN) 7.5 MG tablet Take 1 tablet (7.5 mg total) by mouth daily at 6 PM. Patient taking differently: Take 7.5 mg by mouth See admin instructions. Take 1 tablet (7.5 mg) by mouth on Mondays, Wednesdays, & Fridays at 6pm Take 0.5 tablet (3.75 mg) by mouth Sundays, Tuesdays, Thursdays, Saturdays at 6pm. 04/20/18  Yes Clegg, Amy D, NP  cephALEXin (KEFLEX) 500 MG capsule Take 1 capsule (500 mg total) by mouth 4 (four) times daily for 28 days. 07/24/18 08/21/18  Yolonda Kida, MD  HYDROcodone-acetaminophen (NORCO/VICODIN) 5-325 MG tablet Take 1-2 tablets by mouth every 4 (four) hours as needed for moderate pain (pain score 4-6). 07/24/18   Yolonda Kida, MD  oxyCODONE (ROXICODONE) 5 MG immediate release tablet Take 1-2 tablets (5-10 mg total) by  mouth every 4 (four) hours as needed for moderate pain or severe pain. Patient not taking: Reported on 07/23/2018 06/11/18   Yolonda Kida, MD    Physical Exam: Vitals:   07/25/18 1027 07/25/18 1040 07/25/18 1232 07/25/18 1253  BP: 117/82   119/75  Pulse: 84 81 79 77  Resp: (!) 24 (!) 24 19 19   Temp:      TempSrc:    Axillary  SpO2: 97% 97% 90% 96%  Weight:      Height:          Constitutional: Drowsy, morbidly obese.  Grossly appears volume overloaded. Eyes: PERRL, lids and conjunctivae normal ENMT: BiPAP mask in place Neck: normal, supple, no masses, no thyromegaly Respiratory: Diffuse diminished breath sounds Cardiovascular: Regular rate and rhythm, no murmurs / rubs / gallops.  3+ bilateral lower extremity edema 2+ pedal pulses. No carotid bruits.  Abdomen: no tenderness, no masses palpated. No hepatosplenomegaly. Bowel sounds positive.  Musculoskeletal: No gross joint deformity noted. Skin: Right lower extremity dressing noted without any evidence of active bleeding or infection Neurologic: Unable to thoroughly perform this due to his drowsiness but grossly  moving all of his extremities Psychiatric: Drowsy therefore unable to obtain this Positive for scrotal edema Patient has condom catheter in place  Labs on Admission: I have personally reviewed following labs and imaging studies  CBC: Recent Labs  Lab 07/23/18 1329  WBC 6.4  HGB 11.7*  HCT 41.4  MCV 92.2  PLT 391   Basic Metabolic Panel: Recent Labs  Lab 07/23/18 1329  NA 143  K 3.0*  CL 95*  CO2 36*  GLUCOSE 105*  BUN 17  CREATININE 1.56*  CALCIUM 8.6*   GFR: Estimated Creatinine Clearance: 132.7 mL/min (A) (by C-G formula based on SCr of 1.56 mg/dL (H)). Liver Function Tests: No results for input(s): AST, ALT, ALKPHOS, BILITOT, PROT, ALBUMIN in the last 168 hours. No results for input(s): LIPASE, AMYLASE in the last 168 hours. No results for input(s): AMMONIA in the last 168 hours. Coagulation Profile: Recent Labs  Lab 07/23/18 1329 07/25/18 0504  INR 1.69 1.90   Cardiac Enzymes: No results for input(s): CKTOTAL, CKMB, CKMBINDEX, TROPONINI in the last 168 hours. BNP (last 3 results) No results for input(s): PROBNP in the last 8760 hours. HbA1C: No results for input(s): HGBA1C in the last 72 hours. CBG: No results for input(s): GLUCAP in the last 168 hours. Lipid Profile: No results for input(s): CHOL, HDL, LDLCALC, TRIG, CHOLHDL, LDLDIRECT in the last 72 hours. Thyroid Function Tests: No results for input(s): TSH, T4TOTAL, FREET4, T3FREE, THYROIDAB in the last 72 hours. Anemia Panel: No results for input(s): VITAMINB12, FOLATE, FERRITIN, TIBC, IRON, RETICCTPCT in the last 72 hours. Urine analysis:    Component Value Date/Time   COLORURINE YELLOW 11/08/2012 2055   APPEARANCEUR CLEAR 11/08/2012 2055   LABSPEC 1.024 11/08/2012 2055   PHURINE 6.0 11/08/2012 2055   GLUCOSEU NEGATIVE 11/08/2012 2055   HGBUR NEGATIVE 11/08/2012 2055   BILIRUBINUR negative 04/03/2017 1053   KETONESUR negative 04/03/2017 1053   KETONESUR NEGATIVE 11/08/2012 2055    PROTEINUR negative 04/03/2017 1053   PROTEINUR NEGATIVE 11/08/2012 2055   UROBILINOGEN 0.2 04/03/2017 1053   UROBILINOGEN 1.0 11/08/2012 2055   NITRITE Negative 04/03/2017 1053   NITRITE NEGATIVE 11/08/2012 2055   LEUKOCYTESUR Negative 04/03/2017 1053   Sepsis Labs: !!!!!!!!!!!!!!!!!!!!!!!!!!!!!!!!!!!!!!!!!!!! @LABRCNTIP (procalcitonin:4,lacticidven:4) ) Recent Results (from the past 240 hour(s))  Body fluid culture     Status: None (Preliminary result)  Collection Time: 07/23/18  3:26 PM  Result Value Ref Range Status   Specimen Description FLUID JOINT OTHER  Final   Special Requests NONE  Final   Gram Stain   Final    ABUNDANT WBC PRESENT, PREDOMINANTLY PMN FEW GRAM POSITIVE COCCI Performed at [facility], [address] [place], [place] [address]    Culture FEW STAPHYLOCOCCUS AUREUS  Final   Report Status PENDING  Incomplete     Radiological Exams on Admission: Dg Chest Port 1 View  Result Date: 07/25/2018 CLINICAL DATA:  Dyspnea EXAM: PORTABLE CHEST 1 VIEW COMPARISON:  Portable exam 1116 hours compared to 11/25/2017 FINDINGS: Enlargement of cardiac silhouette with pulmonary vascular congestion. Mild perihilar infiltrates likely pulmonary edema. No gross pleural effusion or pneumothorax IMPRESSION: Enlargement of cardiac silhouette with pulmonary vascular congestion and mild pulmonary edema. Electronically Signed   By: Ulyses Southward M.D.   On: 07/25/2018 11:35     All images have been reviewed by me personally.   Assessment/Plan Principal Problem:   Acute respiratory failure with hypoxia and hypercapnia (HCC) Active Problems:   Sleep apnea   HTN (hypertension)   Hyperlipidemia   Chronic HFrEF (heart failure with reduced ejection fraction) (HCC)   CKD (chronic kidney disease), stage III (HCC)   Erectile dysfunction   Quadriceps tendon rupture, right, initial encounter   Infection of right knee (HCC)   Acute respiratory failure with hypoxia and  hypercapnia Acute metabolic encephalopathy, likely CO2 narcosis Acute on chronic combined systolic and congestive heart failure, ejection fraction 20%, class IV - We will transfer the patient to the stepdown unit, maintain BiPAP -Repeat ABG in about 1 hour.  Lasix 80 mg IVx2.  Hold off on home oral torsemide -Replete electrolytes, closely monitor - Chest x-ray suggestive of fluid overload, elevated BNP - If does not wake up soon with BiPAP, will give him Narcan.  Has not received narcotics in last few hours. -Continue provide supportive care. - Echocardiogram/17/19-ejection fraction 20-25% with severely reduced systolic function. -Continue Coreg 3.125 twice daily, losartan 25 mg daily, Aldactone 12.5 mg daily.  Isosorbide mononitrate 60 mg daily  Persistent atrial fibrillation - On amiodarone 200 mg twice daily, Coreg, digoxin, Coumadin.  Pharmacy to dose  Essential hypertension -Continue current medications  Peripheral neuropathy -Continue gabapentin 30 mg 3 times daily  DVT prophylaxis: Coumadin Code Status: Full code Family Communication: None at bedside Disposition Plan: Transfer patient to stepdown unit  Critical care time spent : 40 minutes examining the patient, discussing with RN, consultants as needed, coordinating care and management.The medical decision making on this patient was of high complexity, the critically ill patient is at high risk for clinical deterioration.  Critical care was necessary to treat or prevent imminent or life-threatening deterioration. Critical care was time spent personally by me on the following activities: development of treatment plan with patient and/or surrogate as well as nursing, discussions with consultants, evaluation of patient's response to treatment, examination of patient, obtaining history from patient or surrogate, ordering and performing treatments and interventions, ordering and review of laboratory studies, ordering and review of  radiographic studies, pulse oximetry and re-evaluation of patient's condition.    Gaelen Brager Joline Maxcy MD [facility] Pager [phone]  If 7PM-7AM, please contact night-coverage [email or web address] Password [facility]  07/25/2018, 12:59 PM

## 2018-07-26 ENCOUNTER — Inpatient Hospital Stay (HOSPITAL_COMMUNITY): Payer: Managed Care, Other (non HMO)

## 2018-07-26 LAB — BLOOD GAS, ARTERIAL
Acid-Base Excess: 8 mmol/L — ABNORMAL HIGH (ref 0.0–2.0)
Acid-Base Excess: 7.9 mmol/L — ABNORMAL HIGH (ref 0.0–2.0)
Bicarbonate: 34.1 mmol/L — ABNORMAL HIGH (ref 20.0–28.0)
Bicarbonate: 34.1 mmol/L — ABNORMAL HIGH (ref 20.0–28.0)
DRAWN BY: 105521
Delivery systems: POSITIVE
Delivery systems: POSITIVE
Drawn by: 30136
Expiratory PAP: 12
Expiratory PAP: 12
FIO2: 40
FIO2: 0.3
Inspiratory PAP: 22
Inspiratory PAP: 22
Mode: POSITIVE
O2 Saturation: 98.6 %
O2 Saturation: 96 %
Patient temperature: 98.2
Patient temperature: 98.6
RATE: 20 resp/min
RATE: 20 resp/min
pCO2 arterial: 67.8 mmHg (ref 32.0–48.0)
pCO2 arterial: 70.6 mmHg (ref 32.0–48.0)
pH, Arterial: 7.32 — ABNORMAL LOW (ref 7.350–7.450)
pH, Arterial: 7.305 — ABNORMAL LOW (ref 7.350–7.450)
pO2, Arterial: 122 mmHg — ABNORMAL HIGH (ref 83.0–108.0)
pO2, Arterial: 88.5 mmHg (ref 83.0–108.0)

## 2018-07-26 LAB — URINE CULTURE: Culture: NO GROWTH

## 2018-07-26 LAB — BASIC METABOLIC PANEL
Anion gap: 17 — ABNORMAL HIGH (ref 5–15)
BUN: 42 mg/dL — ABNORMAL HIGH (ref 6–20)
CO2: 33 mmol/L — ABNORMAL HIGH (ref 22–32)
Calcium: 8.4 mg/dL — ABNORMAL LOW (ref 8.9–10.3)
Chloride: 86 mmol/L — ABNORMAL LOW (ref 98–111)
Creatinine, Ser: 3.79 mg/dL — ABNORMAL HIGH (ref 0.61–1.24)
GFR calc Af Amer: 22 mL/min — ABNORMAL LOW (ref >60)
GFR calc non Af Amer: 19 mL/min — ABNORMAL LOW (ref >60)
Glucose, Bld: 107 mg/dL — ABNORMAL HIGH (ref 70–99)
Potassium: 4 mmol/L (ref 3.5–5.1)
Sodium: 136 mmol/L (ref 135–145)

## 2018-07-26 LAB — CHLORIDE, URINE, RANDOM: Chloride Urine: <15 mmol/L

## 2018-07-26 LAB — MAGNESIUM: Magnesium: 1.9 mg/dL (ref 1.7–2.4)

## 2018-07-26 LAB — OSMOLALITY: Osmolality: 302 mOsm/kg — ABNORMAL HIGH (ref 275–295)

## 2018-07-26 LAB — PROTIME-INR
INR: 2.45
Prothrombin Time: 26.2 seconds — ABNORMAL HIGH (ref 11.4–15.2)

## 2018-07-26 LAB — NA AND K (SODIUM & POTASSIUM), RAND UR: Potassium Urine: 24 mmol/L

## 2018-07-26 LAB — OSMOLALITY, URINE: Osmolality, Ur: 340 mOsm/kg (ref 300–900)

## 2018-07-26 MED ORDER — WARFARIN SODIUM 2.5 MG PO TABS
3.7500 mg | ORAL_TABLET | Freq: Once | ORAL | Status: AC
  Administered 2018-07-26: 3.75 mg via ORAL
  Filled 2018-07-26: qty 1

## 2018-07-26 NOTE — Progress Notes (Addendum)
CRITICAL VALUE ALERT  Critical Value:  ABG: pH 7.32, 122 O2, 68 CO2  Date & Time Notied:  07/26/18 0916  Provider Notified: 07/26/18 0918  Orders Received/Actions taken: Provider made aware.    Provider at bedside. Per Dr. Nelson Chimes, MD.  Continue BiPap continuous.  Pt is alert enough to briefly eat and take po medications.

## 2018-07-26 NOTE — Progress Notes (Signed)
RT has attempted to contact [facility] about pts critical PaCO2 of 77. RT has made 3 attempts. RT has paged information to MD on tonight for [facility].

## 2018-07-26 NOTE — Progress Notes (Signed)
CRITICAL VALUE ALERT  Critical Value:  ABG: pH 7.300, CO2 70.6, O2 88.5, Bicarb 34  Date & Time Notied:  07/26/18 1645  Provider Notified: 07/26/18 1647  Orders Received/Actions taken:Provider made aware.  Verbal order with read back to notify RRT to change Bipap settings to iPap 24/12 and to order ABG for 2000.

## 2018-07-26 NOTE — Progress Notes (Signed)
This note also relates to the following rows which could not be included: Resp - Cannot attach notes to unvalidated device data  Took pt off bipap. Pt ate and is alert.  Pt wants to stay off bipap for a while  Vital signs stable

## 2018-07-26 NOTE — Progress Notes (Signed)
PT Cancellation Note  Patient Details Name: Mark Baker MRN: [id number] DOB: 03/19/1981   Cancelled Treatment:    Reason Eval/Treat Not Completed: Medical issues which prohibited therapy;Patient's level of consciousness.  Noted events of past 24 hours and will defer mobility assessment until pt medically appropriate, following up next date.     Narda Amber [facility] 07/26/2018, 9:43 AM

## 2018-07-26 NOTE — Progress Notes (Signed)
ANTICOAGULATION CONSULT NOTE - Follow-Up Consult  Pharmacy Consult for warfarin Indication: atrial fibrillation  Allergies  Allergen Reactions  . Aspirin Hives, Itching and Rash  . Entresto [Sacubitril-Valsartan] Cough    Was on Entresto 07/2016-09/2016    Patient Measurements: Height: 6\' 3"  (190.5 cm) Weight: (unable to get pt's weight/ bed transfer ) IBW/kg (Calculated) : 84.5   Vital Signs: Temp: 98.2 F (36.8 C) (12/14 2332) Temp Source: Axillary (12/15 0844) BP: 147/75 (12/15 0844) Pulse Rate: 77 (12/15 0844)  Labs: Recent Labs    07/23/18 1329 07/25/18 0504 07/26/18 0214  HGB 11.7*  --   --   HCT 41.4  --   --   PLT 391  --   --   LABPROT 19.6* 21.6* 26.2*  INR 1.69 1.90 2.45  CREATININE 1.56*  --  3.79*    Estimated Creatinine Clearance: 54.6 mL/min (A) (by C-G formula based on SCr of 3.79 mg/dL (H)).   Medical History: Past Medical History:  Diagnosis Date  . Childhood asthma    no exacerbation since age 26   . Chronic combined systolic and diastolic CHF (congestive heart failure) (HCC)    a) ECHO (11/2012): EF 40-45%, RV fx difficult to see, nl size b) ECHO (05/2014): EF 20-25%, grade 2 DD, RV mildly dilated and sys fx mod reduced  . Hypertension   . Migraine    "down to maybe 2/year now; used to have them q other week" (11/26/2017)  . NICM (nonischemic cardiomyopathy) (HCC)    a) (11/2012): normal coronaries  . OSA on CPAP    wears CPAP  . Postoperative wound infection    right knee  . Wears glasses      Assessment: 89 yoM with morbidy obesity and AFib admitted for R knee I&D. Pt on warfarin PTA which was initially resumed by surgical MD with plans for discharge 12/14 on home regimen. Pt decompensated with respiratory insufficiency requiring BiPAP and will remain admitted with hospitalist service, pharmacy to take over dosing warfarin.   INR 2.45 today, pt noted to have missed dose last night due to inability to take PO meds with AMS and hypoxia.  Per RN notes it appears pt is improving somewhat, will continue with warfarin dosing.  *PTA Dose = 7.5mg  Mon/Wed/Fri, 3.75mg  all other days  Goal of Therapy:  INR 2-3 Monitor platelets by anticoagulation protocol: Yes   Plan:  -Warfarin 3.75mg  PO x1 tonight -Daily protime  Fredonia Highland, PharmD, BCPS Clinical Pharmacist [phone] Please check AMION for all [facility]acy numbers 07/26/2018

## 2018-07-26 NOTE — Progress Notes (Addendum)
PROGRESS NOTE    Mark Baker  EXB:[id number] DOB: September 15, 1980 DOA: 07/23/2018 PCP: Sherren Mocha, MD   Brief Narrative:  37 y.o. 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  Obesity with BMI greater than 60, paroxysmal atrial fibrillation was admitted to the hospital with increasing drainage from the right knee following quadricep tendon repair about 6 weeks ago.  Patient underwent excisional debridement of the right knee which he tolerated well but the following day he developed shortness of breath and was very drowsy.  Patient was offered CPAP yesterday night but he refused.  Medicine team consulted for hypoxic, hypercarbic respiratory failure requiring BiPAP.   Assessment & Plan:   Principal Problem:   Acute respiratory failure with hypoxia and hypercapnia (HCC) Active Problems:   Sleep apnea   HTN (hypertension)   Hyperlipidemia   Chronic HFrEF (heart failure with reduced ejection fraction) (HCC)   CKD (chronic kidney disease), stage III (HCC)   Erectile dysfunction   Quadriceps tendon rupture, right, initial encounter   Infection of right knee (HCC)   Acute respiratory failure with hypoxia and hypercapnia, very slow to improve Acute metabolic encephalopathy, slowly improving Acute on chronic combined systolic and congestive heart failure, ejection fraction 20%, class IV -  On ABG patient's pH is improved to 7.3, CO2 has trended down to 67.  His mentation has improved as well. -Replete electrolytes, closely monitor - Chest x-ray suggestive of fluid overload, elevated BNP -Continue provide supportive care. - Echocardiogram 11/26/17-ejection fraction 20-25% with severely reduced systolic function. Will get repeat Echo -Continue Coreg 3.125 twice daily Isosorbide mononitrate 60 mg daily.  Hold Aldactone, losartan and Lasix  today. -Serial ABG, continue neurochecks -Low threshold to transfer him to the ICU, I had discussed the case with Dr. Delton Coombes yesterday  Acute kidney injury -Concern for cardiorenal syndrome.  Will check renal ultrasound, urine electrolytes -Hold off on nephrotoxic drugs including Lasix, Aldactone and losartan.  Monitor his urine output.  Persistent atrial fibrillation - On amiodarone 200 mg twice daily, Coreg, digoxin, Coumadin.  Pharmacy to dose  Essential hypertension -Continue current medications  Peripheral neuropathy -Continue gabapentin 300 mg 3 times daily   DVT prophylaxis: Coumadin Code Status: Full code Family Communication: Nephew at bedside Disposition Plan: Maintain hospitalization stay in stepdown unit while he is on BiPAP  Procedures:   Quadricep tendon repair on 12/13     Subjective: Mentation slightly improved this morning.  He is awake and able to carry on conversation. Yesterday evening he was quite drowsy and lethargic therefore his BiPAP settings were changed.  Review of Systems Otherwise negative except as per HPI, including: General = no fevers, chills, dizziness, malaise, fatigue HEENT/EYES = negative for pain, redness, loss of vision, double vision, blurred vision, loss of hearing, sore throat, hoarseness, dysphagia Cardiovascular= negative for chest pain, palpitation, murmurs, lower extremity swelling Respiratory/lungs= negative forhemoptysis, wheezing, mucus production Gastrointestinal= negative for nausea, vomiting,, abdominal pain, melena, hematemesis Genitourinary= negative for Dysuria, Hematuria, Change in Urinary Frequency MSK = Negative for arthralgia, myalgias, Back Pain, Joint swelling  Neurology= Negative for headache, seizures, numbness, tingling  Psychiatry= Negative for anxiety, depression, suicidal and homocidal ideation Allergy/Immunology= Medication/Food allergy as listed  Skin= Negative for Rash, lesions, ulcers,  itching   Objective: Vitals:   07/26/18 0805 07/26/18 0844 07/26/18 1100 07/26/18 1241  BP:  (!) 147/75  139/77  Pulse: 77 77  80  Resp:  17 (!) 22 (!) 25   Temp:    98.3 F (36.8 C)  TempSrc:  Axillary  Oral  SpO2: 100% 98%  97%  Weight:      Height:        Intake/Output Summary (Last 24 hours) at 07/26/2018 1342 Last data filed at 07/26/2018 1331 Gross per 24 hour  Intake 240 ml  Output 2080 ml  Net -1840 ml   Filed Weights   07/23/18 1231 07/23/18 1743  Weight: (!) 216.8 kg (!) 238.6 kg    Examination:  Constitutional: NAD, calm, comfortable, on BiPAP, morbidly obese Eyes: PERRL, lids and conjunctivae normal ENMT: Mucous membranes are moist. Posterior pharynx clear of any exudate or lesions.Normal dentition.  Neck: normal, supple, no masses, no thyromegaly Respiratory: Bilateral diffuse breath sounds Cardiovascular: Regular rate and rhythm, no murmurs / rubs / gallops.  2-3+ bilateral lower extremity pitting edema. 2+ pedal pulses. No carotid bruits.  Abdomen: no tenderness, no masses palpated. No hepatosplenomegaly. Bowel sounds positive.  Musculoskeletal: no clubbing / cyanosis. No joint deformity upper and lower extremities. Good ROM, no contractures. Normal muscle tone.  Skin: no rashes, lesions, ulcers. No induration Neurologic: CN 2-12 grossly intact. Sensation intact, DTR normal. Strength 5/5 in all 4.  Psychiatric: Normal judgment and insight. Alert and oriented x 3. Normal mood.    Data Reviewed:   CBC: Recent Labs  Lab 07/23/18 1329  WBC 6.4  HGB 11.7*  HCT 41.4  MCV 92.2  PLT 391   Basic Metabolic Panel: Recent Labs  Lab 07/23/18 1329 07/26/18 0214  NA 143 136  K 3.0* 4.0  CL 95* 86*  CO2 36* 33*  GLUCOSE 105* 107*  BUN 17 42*  CREATININE 1.56* 3.79*  CALCIUM 8.6* 8.4*  MG  --  1.9   GFR: Estimated Creatinine Clearance: 54.6 mL/min (A) (by C-G formula based on SCr of 3.79 mg/dL (H)). Liver Function Tests: No results for input(s):  AST, ALT, ALKPHOS, BILITOT, PROT, ALBUMIN in the last 168 hours. No results for input(s): LIPASE, AMYLASE in the last 168 hours. No results for input(s): AMMONIA in the last 168 hours. Coagulation Profile: Recent Labs  Lab 07/23/18 1329 07/25/18 0504 07/26/18 0214  INR 1.69 1.90 2.45   Cardiac Enzymes: No results for input(s): CKTOTAL, CKMB, CKMBINDEX, TROPONINI in the last 168 hours. BNP (last 3 results) No results for input(s): PROBNP in the last 8760 hours. HbA1C: No results for input(s): HGBA1C in the last 72 hours. CBG: No results for input(s): GLUCAP in the last 168 hours. Lipid Profile: No results for input(s): CHOL, HDL, LDLCALC, TRIG, CHOLHDL, LDLDIRECT in the last 72 hours. Thyroid Function Tests: No results for input(s): TSH, T4TOTAL, FREET4, T3FREE, THYROIDAB in the last 72 hours. Anemia Panel: No results for input(s): VITAMINB12, FOLATE, FERRITIN, TIBC, IRON, RETICCTPCT in the last 72 hours. Sepsis Labs: No results for input(s): PROCALCITON, LATICACIDVEN in the last 168 hours.  Recent Results (from the past 240 hour(s))  Body fluid culture     Status: None (Preliminary result)   Collection Time: 07/23/18  3:26 PM  Result Value Ref Range Status   Specimen Description FLUID JOINT OTHER  Final   Special Requests NONE  Final   Gram Stain   Final    ABUNDANT WBC PRESENT, PREDOMINANTLY PMN FEW GRAM POSITIVE COCCI    Culture   Final    FEW STAPHYLOCOCCUS AUREUS REPEATING SUSCEPTIBILITY Performed at [facility], [address] [place], [place] [address]  Report Status PENDING  Incomplete         Radiology Studies: Dg Chest Port 1 View  Result Date: 07/26/2018 CLINICAL DATA:  Acute renal insufficiency. EXAM: PORTABLE CHEST 1 VIEW COMPARISON:  July 25, 2018 FINDINGS: Marked enlargement of the cardiac silhouette, unchanged. No pneumothorax. No overt edema. No focal infiltrate. IMPRESSION: Marked enlargement the cardiac silhouette, stable. No overt  edema or other acute abnormalities. Electronically Signed   By: Gerome Sam III M.D   On: 07/26/2018 10:01   Dg Chest Port 1 View  Result Date: 07/25/2018 CLINICAL DATA:  Dyspnea EXAM: PORTABLE CHEST 1 VIEW COMPARISON:  Portable exam 1116 hours compared to 11/25/2017 FINDINGS: Enlargement of cardiac silhouette with pulmonary vascular congestion. Mild perihilar infiltrates likely pulmonary edema. No gross pleural effusion or pneumothorax IMPRESSION: Enlargement of cardiac silhouette with pulmonary vascular congestion and mild pulmonary edema. Electronically Signed   By: Ulyses Southward M.D.   On: 07/25/2018 11:35        Scheduled Meds: . amiodarone  200 mg Oral BID  . atorvastatin  40 mg Oral Daily  . carvedilol  3.125 mg Oral BID  . digoxin  0.25 mg Oral Daily  . docusate sodium  100 mg Oral BID  . gabapentin  300 mg Oral TID  . hydrALAZINE  50 mg Oral Q8H  . isosorbide mononitrate  60 mg Oral Daily  . magnesium oxide  800 mg Oral Once  . mouth rinse  15 mL Mouth Rinse BID  . warfarin  3.75 mg Oral ONCE-1800  . Warfarin - Pharmacist Dosing Inpatient   Does not apply q1800   Continuous Infusions: .  ceFAZolin (ANCEF) IV 2 g (07/26/18 1331)     LOS: 2 days   Time spent= 35 mins    Ankit Joline Maxcy, MD [facility] Pager [phone]   If 7PM-7AM, please contact night-coverage [email or web address] Password [email or web address] 07/26/2018, 1:42 PM

## 2018-07-26 NOTE — Progress Notes (Signed)
   Subjective: 3 Days Post-Op Procedure(s) (LRB): Right knee  incisional irrigation and debridement (Right)  Pt still somnolent currently on Bipap Appreciate internal medicine involvement Right knee stable at this point  Patient reports pain as mild.  Objective:   VITALS:   Vitals:   07/26/18 0523 07/26/18 0805  BP: 137/78   Pulse:  77  Resp:  17  Temp:    SpO2:  100%    Right knee dressing intact No rashes distally No drainage   LABS Recent Labs    07/23/18 1329  HGB 11.7*  HCT 41.4  WBC 6.4  PLT 391    Recent Labs    07/23/18 1329 07/26/18 0214  NA 143 136  K 3.0* 4.0  BUN 17 42*  CREATININE 1.56* 3.79*  GLUCOSE 105* 107*     Assessment/Plan: 3 Days Post-Op Procedure(s) (LRB): Right knee  incisional irrigation and debridement (Right) Acute respiratory failure being managed by hospitalist and possibly PCCM if pt fails to respond to current treatment Right knee stable currently Will continue to monitor his progress    Alphonsa Overall PA-C, MPAS [facility] is now [facility] [employer] [facility] [address], [place], [place] [address] Phone: [phone] [email or web address] Facebook  [employer]

## 2018-07-27 ENCOUNTER — Inpatient Hospital Stay (HOSPITAL_COMMUNITY): Payer: Managed Care, Other (non HMO)

## 2018-07-27 DIAGNOSIS — I361 Nonrheumatic tricuspid (valve) insufficiency: Secondary | ICD-10-CM

## 2018-07-27 DIAGNOSIS — I370 Nonrheumatic pulmonary valve stenosis: Secondary | ICD-10-CM

## 2018-07-27 LAB — BASIC METABOLIC PANEL
Anion gap: 14 (ref 5–15)
BUN: 54 mg/dL — ABNORMAL HIGH (ref 6–20)
CHLORIDE: 87 mmol/L — AB (ref 98–111)
CO2: 35 mmol/L — ABNORMAL HIGH (ref 22–32)
Calcium: 8.4 mg/dL — ABNORMAL LOW (ref 8.9–10.3)
Creatinine, Ser: 3.86 mg/dL — ABNORMAL HIGH (ref 0.61–1.24)
GFR calc Af Amer: 22 mL/min — ABNORMAL LOW (ref >60)
GFR calc non Af Amer: 19 mL/min — ABNORMAL LOW (ref >60)
Glucose, Bld: 117 mg/dL — ABNORMAL HIGH (ref 70–99)
Potassium: 3.9 mmol/L (ref 3.5–5.1)
Sodium: 136 mmol/L (ref 135–145)

## 2018-07-27 LAB — BLOOD GAS, ARTERIAL
Acid-Base Excess: 9.9 mmol/L — ABNORMAL HIGH (ref 0.0–2.0)
Bicarbonate: 35.6 mmol/L — ABNORMAL HIGH (ref 20.0–28.0)
DRAWN BY: 275531
Delivery systems: POSITIVE
Expiratory PAP: 12
FIO2: 30
Inspiratory PAP: 24
O2 Saturation: 97.2 %
Patient temperature: 98.6
pCO2 arterial: 66.2 mmHg (ref 32.0–48.0)
pH, Arterial: 7.35 (ref 7.350–7.450)
pO2, Arterial: 97 mmHg (ref 83.0–108.0)

## 2018-07-27 LAB — BODY FLUID CULTURE

## 2018-07-27 LAB — BRAIN NATRIURETIC PEPTIDE: B Natriuretic Peptide: 853.6 pg/mL — ABNORMAL HIGH (ref 0.0–100.0)

## 2018-07-27 LAB — PROTIME-INR
INR: 2.45
Prothrombin Time: 26.3 seconds — ABNORMAL HIGH (ref 11.4–15.2)

## 2018-07-27 LAB — ECHOCARDIOGRAM COMPLETE
Height: 75 in
Weight: 8416.28 oz

## 2018-07-27 LAB — MAGNESIUM: Magnesium: 2.1 mg/dL (ref 1.7–2.4)

## 2018-07-27 MED ORDER — PERFLUTREN LIPID MICROSPHERE
1.0000 mL | INTRAVENOUS | Status: AC | PRN
  Administered 2018-07-27: 2 mL via INTRAVENOUS
  Filled 2018-07-27: qty 10

## 2018-07-27 MED ORDER — WARFARIN SODIUM 7.5 MG PO TABS
7.5000 mg | ORAL_TABLET | Freq: Once | ORAL | Status: AC
  Administered 2018-07-27: 7.5 mg via ORAL
  Filled 2018-07-27: qty 1

## 2018-07-27 NOTE — Progress Notes (Signed)
ANTICOAGULATION CONSULT NOTE - Follow-Up Consult  Pharmacy Consult for warfarin Indication: atrial fibrillation  Allergies  Allergen Reactions  . Aspirin Hives, Itching and Rash  . Entresto [Sacubitril-Valsartan] Cough    Was on Entresto 07/2016-09/2016    Patient Measurements: Height: 6\' 3"  (190.5 cm) Weight: (unable to get pt's weight/ bed transfer ) IBW/kg (Calculated) : 84.5   Vital Signs: Temp: 98 F (36.7 C) (12/16 1315) Temp Source: Oral (12/16 1315) BP: 142/80 (12/16 1415) Pulse Rate: 82 (12/16 1417)  Labs: Recent Labs    07/25/18 0504 07/26/18 0214 07/27/18 0308  LABPROT 21.6* 26.2* 26.3*  INR 1.90 2.45 2.45  CREATININE  --  3.79* 3.86*    Estimated Creatinine Clearance: 53.6 mL/min (A) (by C-G formula based on SCr of 3.86 mg/dL (H)).   Medical History: Past Medical History:  Diagnosis Date  . Childhood asthma    no exacerbation since age 37   . Chronic combined systolic and diastolic CHF (congestive heart failure) (HCC)    a) ECHO (11/2012): EF 40-45%, RV fx difficult to see, nl size b) ECHO (05/2014): EF 20-25%, grade 2 DD, RV mildly dilated and sys fx mod reduced  . Hypertension   . Migraine    "down to maybe 2/year now; used to have them q other week" (11/26/2017)  . NICM (nonischemic cardiomyopathy) (HCC)    a) (11/2012): normal coronaries  . OSA on CPAP    wears CPAP  . Postoperative wound infection    right knee  . Wears glasses      Assessment: 37 yoM with morbidy obesity and AFib admitted for R knee I&D. Pt on warfarin PTA which was initially resumed by surgical MD with plans for discharge 12/14 on home regimen. Pt decompensated with respiratory insufficiency requiring BiPAP and will remain admitted with hospitalist service, pharmacy consulted  to take dose warfarin.   INR 2.45 today, pt noted to have missed dose on 07/25/18 due to inability to take PO meds with AMS and hypoxia. Patient's mentation has improved and he was able to take  warfarin dose last night.   No bleeding noted  *PTA Dose = 7.5mg  Mon/Wed/Fri, 3.75mg  all other days (LD pta taken 07/20/18) INR 1.69 on admit 07/23/18  Goal of Therapy:  INR 2-3 Monitor platelets by anticoagulation protocol: Yes   Plan:  -Warfarin 7.5mg  PO x1 tonight -Daily protime  Noah Delaine, RPh Clinical Pharmacist Pager: [phone] Please check AMION for all [facility] Pharmacy numbers 07/27/2018

## 2018-07-27 NOTE — Progress Notes (Signed)
Rehab Admissions Coordinator Note:  Patient was screened by Clois Dupes for appropriateness for an Inpatient Acute Rehab Consult per PT recommendation.  At this time, we are recommending Inpatient Rehab consult.  Clois Dupes 07/27/2018, 9:59 PM  I can be reached at [phone].

## 2018-07-27 NOTE — Progress Notes (Signed)
  Echocardiogram 2D Echocardiogram has been performed.  Mark Baker 07/27/2018, 11:33 AM

## 2018-07-27 NOTE — Progress Notes (Signed)
PROGRESS NOTE    Mark Baker  WNU:[id number] DOB: 1981-06-04 DOA: 07/23/2018 PCP: Sherren Mocha, MD   Brief Narrative:  37 y.o. 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  Obesity with BMI greater than 60, paroxysmal atrial fibrillation was admitted to the hospital with increasing drainage from the right knee following quadricep tendon repair about 6 weeks ago.  Patient underwent excisional debridement of the right knee which he tolerated well but the following day he developed shortness of breath and was very drowsy.  Patient was offered CPAP yesterday night but he refused.  Medicine team consulted for hypoxic, hypercarbic respiratory failure requiring BiPAP.  Due to worsening renal function, nephrology was consulted.   Assessment & Plan:   Principal Problem:   Acute respiratory failure with hypoxia and hypercapnia (HCC) Active Problems:   Sleep apnea   HTN (hypertension)   Hyperlipidemia   Chronic HFrEF (heart failure with reduced ejection fraction) (HCC)   CKD (chronic kidney disease), stage III (HCC)   Erectile dysfunction   Quadriceps tendon rupture, right, initial encounter   Infection of right knee (HCC)   Acute respiratory failure with hypoxia and hypercapnia, slow to improve Acute metabolic encephalopathy, better Acute on chronic combined systolic and congestive heart failure, ejection fraction 20%, class IV - ABG this morning shows improvement in his pH and PCO2 is trending down.  Mentation is better as well -Closely monitor electrolytes. - Although he has clinical signs of volume overload, holding off on diuresis given worsening renal function.  We can consider Diamox for diuresis to prevent from worsening of alkalosis. -Continue provide supportive care. - Echocardiogram 11/26/17-ejection fraction 20-25% with severely  reduced systolic function.  Repeat echocardiogram -Continue Coreg 3.125 twice daily Isosorbide mononitrate 60 mg daily.  Holding nephrotoxic drugs including Aldactone, losartan and Lasix -Serial ABG. -Advised necessary margin from bed to chair.  Change BiPAP orders to PRN so he can eat.  Acute kidney injury -Concern for cardiorenal syndrome.  Creatinine trended up to 3.8.  Baseline 1.5.  Electrolytes show intravascular volume depletion but grossly is volume overloaded.  Will consult nephrology for their assistance.  Persistent atrial fibrillation - On amiodarone 200 mg twice daily, Coreg, digoxin, Coumadin.  Pharmacy to dose  Essential hypertension -Continue current medications  Peripheral neuropathy -Gabapentin 300 mg 3 times daily   DVT prophylaxis: Coumadin Code Status: Full code Family Communication: None at bedside Disposition Plan: Maintain inpatient stay while on BiPAP and improvement in his renal function.  Procedures:   Quadricep tendon repair on 12/13     Subjective: Mentation is better, he is able to carry on a conversation.  He wants BiPAP to be taken off so he can eat and drink.  Still has quite a bit of exertional shortness of breath.  Review of Systems Otherwise negative except as per HPI, including: General = no fevers, chills, dizziness, malaise, fatigue HEENT/EYES = negative for pain, redness, loss of vision, double vision, blurred vision, loss of hearing, sore throat, hoarseness, dysphagia Cardiovascular= negative for chest pain, palpitation, murmurs, lower extremity swelling Respiratory/lungs= negative for , cough, hemoptysis, wheezing, mucus production Gastrointestinal= negative for nausea, vomiting,, abdominal pain, melena, hematemesis Genitourinary= negative for Dysuria, Hematuria, Change in Urinary Frequency MSK = Negative for arthralgia, myalgias, Back Pain, Joint swelling  Neurology= Negative for headache, seizures, numbness, tingling    Psychiatry= Negative for anxiety, depression, suicidal and homocidal ideation Allergy/Immunology= Medication/Food allergy  as listed  Skin= Negative for Rash, lesions, ulcers, itching    Objective: Vitals:   07/27/18 0530 07/27/18 0800 07/27/18 0821 07/27/18 0941  BP:  (!) 146/84    Pulse:  73 77 75  Resp:  15 (!) 23   Temp:  98.3 F (36.8 C)    TempSrc:  Oral    SpO2: 95% 96% 99%   Weight:      Height:        Intake/Output Summary (Last 24 hours) at 07/27/2018 1022 Last data filed at 07/27/2018 0530 Gross per 24 hour  Intake 890 ml  Output 2025 ml  Net -1135 ml   Filed Weights   07/23/18 1231 07/23/18 1743  Weight: (!) 216.8 kg (!) 238.6 kg    Examination:  Constitutional: Appears calm and comfortable while on BiPAP.  Currently on BiPAP Eyes: PERRL, lids and conjunctivae normal ENMT: Mucous membranes are moist. Posterior pharynx clear of any exudate or lesions.Normal dentition.  Neck: normal, supple, no masses, no thyromegaly Respiratory: Diffuse diminished breath sounds Cardiovascular: Regular rate and rhythm, no murmurs / rubs / gallops.  3+ bilateral lower extremity pitting edema. 2+ pedal pulses. No carotid bruits.  Abdomen: no tenderness, no masses palpated. No hepatosplenomegaly. Bowel sounds positive.  Musculoskeletal: no clubbing / cyanosis. No joint deformity upper and lower extremities. Good ROM, no contractures. Normal muscle tone.  Skin: no rashes, lesions, ulcers. No induration Neurologic: CN 2-12 grossly intact. Sensation intact, DTR normal. Strength 4/5 in all 4.  Psychiatric: Normal judgment and insight. Alert and oriented x 3. Normal mood.    Data Reviewed:   CBC: Recent Labs  Lab 07/23/18 1329  WBC 6.4  HGB 11.7*  HCT 41.4  MCV 92.2  PLT 391   Basic Metabolic Panel: Recent Labs  Lab 07/23/18 1329 07/26/18 0214 07/27/18 0308  NA 143 136 136  K 3.0* 4.0 3.9  CL 95* 86* 87*  CO2 36* 33* 35*  GLUCOSE 105* 107* 117*  BUN 17 42* 54*   CREATININE 1.56* 3.79* 3.86*  CALCIUM 8.6* 8.4* 8.4*  MG  --  1.9 2.1   GFR: Estimated Creatinine Clearance: 53.6 mL/min (A) (by C-G formula based on SCr of 3.86 mg/dL (H)). Liver Function Tests: No results for input(s): AST, ALT, ALKPHOS, BILITOT, PROT, ALBUMIN in the last 168 hours. No results for input(s): LIPASE, AMYLASE in the last 168 hours. No results for input(s): AMMONIA in the last 168 hours. Coagulation Profile: Recent Labs  Lab 07/23/18 1329 07/25/18 0504 07/26/18 0214 07/27/18 0308  INR 1.69 1.90 2.45 2.45   Cardiac Enzymes: No results for input(s): CKTOTAL, CKMB, CKMBINDEX, TROPONINI in the last 168 hours. BNP (last 3 results) No results for input(s): PROBNP in the last 8760 hours. HbA1C: No results for input(s): HGBA1C in the last 72 hours. CBG: No results for input(s): GLUCAP in the last 168 hours. Lipid Profile: No results for input(s): CHOL, HDL, LDLCALC, TRIG, CHOLHDL, LDLDIRECT in the last 72 hours. Thyroid Function Tests: No results for input(s): TSH, T4TOTAL, FREET4, T3FREE, THYROIDAB in the last 72 hours. Anemia Panel: No results for input(s): VITAMINB12, FOLATE, FERRITIN, TIBC, IRON, RETICCTPCT in the last 72 hours. Sepsis Labs: No results for input(s): PROCALCITON, LATICACIDVEN in the last 168 hours.  Recent Results (from the past 240 hour(s))  Body fluid culture     Status: None   Collection Time: 07/23/18  3:26 PM  Result Value Ref Range Status   Specimen Description FLUID JOINT OTHER  Final  Special Requests NONE  Final   Gram Stain   Final    ABUNDANT WBC PRESENT, PREDOMINANTLY PMN FEW GRAM POSITIVE COCCI Performed at [facility], [address] [place], [place] [address]    Culture FEW STAPHYLOCOCCUS AUREUS  Final   Report Status 07/27/2018 FINAL  Final   Organism ID, Bacteria STAPHYLOCOCCUS AUREUS  Final      Susceptibility   Staphylococcus aureus - MIC*    CIPROFLOXACIN <=0.5 SENSITIVE Sensitive     ERYTHROMYCIN <=0.25  SENSITIVE Sensitive     GENTAMICIN <=0.5 SENSITIVE Sensitive     OXACILLIN <=0.25 SENSITIVE Sensitive     TETRACYCLINE <=1 SENSITIVE Sensitive     VANCOMYCIN <=0.5 SENSITIVE Sensitive     TRIMETH/SULFA <=10 SENSITIVE Sensitive     CLINDAMYCIN <=0.25 SENSITIVE Sensitive     RIFAMPIN <=0.5 SENSITIVE Sensitive     Inducible Clindamycin NEGATIVE Sensitive     * FEW STAPHYLOCOCCUS AUREUS  Culture, Urine     Status: None   Collection Time: 07/25/18  5:08 PM  Result Value Ref Range Status   Specimen Description URINE, CATHETERIZED  Final   Special Requests NONE  Final   Culture   Final    NO GROWTH Performed at [facility], [address] [place], [place] [address]    Report Status 07/26/2018 FINAL  Final         Radiology Studies: US Renal  Result Date: 07/26/2018 CLINICAL DATA:  Acute renal insufficiency. EXAM: RENAL / URINARY TRACT ULTRASOUND COMPLETE COMPARISON:  None. FINDINGS: Right Kidney: Renal measurements: 12.3 x 8 x 7.6 cm = volume: 392.1 mL. Limited evaluation. Not well visualized. Left Kidney: Renal measurements: 11.5 x 7.3 x 9.5 cm = volume: 414.9 mL. Not well visualized. Limited evaluation. Bladder: Not visualized. IMPRESSION: 1. Severely limited study due to patient body habitus. No gross renal abnormalities noted on limited images. Electronically Signed   By: Gerome Sam III M.D   On: 07/26/2018 14:57   Dg Chest Port 1 View  Result Date: 07/26/2018 CLINICAL DATA:  Acute renal insufficiency. EXAM: PORTABLE CHEST 1 VIEW COMPARISON:  July 25, 2018 FINDINGS: Marked enlargement of the cardiac silhouette, unchanged. No pneumothorax. No overt edema. No focal infiltrate. IMPRESSION: Marked enlargement the cardiac silhouette, stable. No overt edema or other acute abnormalities. Electronically Signed   By: Gerome Sam III M.D   On: 07/26/2018 10:01   Dg Chest Port 1 View  Result Date: 07/25/2018 CLINICAL DATA:  Dyspnea EXAM: PORTABLE CHEST 1 VIEW  COMPARISON:  Portable exam 1116 hours compared to 11/25/2017 FINDINGS: Enlargement of cardiac silhouette with pulmonary vascular congestion. Mild perihilar infiltrates likely pulmonary edema. No gross pleural effusion or pneumothorax IMPRESSION: Enlargement of cardiac silhouette with pulmonary vascular congestion and mild pulmonary edema. Electronically Signed   By: Ulyses Southward M.D.   On: 07/25/2018 11:35        Scheduled Meds: . amiodarone  200 mg Oral BID  . atorvastatin  40 mg Oral Daily  . carvedilol  3.125 mg Oral BID  . digoxin  0.25 mg Oral Daily  . docusate sodium  100 mg Oral BID  . gabapentin  300 mg Oral TID  . hydrALAZINE  50 mg Oral Q8H  . isosorbide mononitrate  60 mg Oral Daily  . magnesium oxide  800 mg Oral Once  . mouth rinse  15 mL Mouth Rinse BID  . Warfarin - Pharmacist Dosing Inpatient   Does not apply q1800   Continuous  Infusions: .  ceFAZolin (ANCEF) IV 2 g (07/27/18 0515)     LOS: 3 days   Time spent= 35 mins    Treazure Nery Joline Maxcy, MD [facility] Pager [phone]   If 7PM-7AM, please contact night-coverage [email or web address] Password [email or web address] 07/27/2018, 10:22 AM

## 2018-07-27 NOTE — Evaluation (Signed)
Physical Therapy Evaluation Patient Details Name: Mark Baker MRN: [id number] DOB: August 29, 1980 Today's Date: 07/27/2018   History of Present Illness  37 y.o. 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  Obesity with BMI greater than 60, paroxysmal atrial fibrillation was admitted to the hospital with increasing drainage from the right knee following quadricep tendon repair about 6 weeks ago.  Underwent I&D 12/12 then developed SOB and lethargy. Required bipap for hypercarbic respiratory failure.  Clinical Impression  Patient presents with decreased mobility due to prolonged bedrest with acute respiratory failure, decreased strength, decreased balance, decreased activity tolerance and R LE weakness/pain with recent I&D.  He can stand with min A of 2 for safety, but not prolonged due to fear of falling, fear of pain R LE and generalized weakness.  Feel he can benefit from skilled PT to progress mobility, safety and independence.  Feel could benefit from CIR level rehab prior to d/c home as was independent with the crutches prior to admission.     Follow Up Recommendations Supervision/Assistance - 24 hour;CIR    Equipment Recommendations  Other (comment)(TBA)    Recommendations for Other Services       Precautions / Restrictions Precautions Precautions: Fall Required Braces or Orthoses: Other Brace Other Brace: ace wrap only R LE Restrictions RLE Weight Bearing: Weight bearing as tolerated      Mobility  Bed Mobility Overal bed mobility: Needs Assistance Bed Mobility: Supine to Sit;Sit to Supine     Supine to sit: +2 for physical assistance;Mod assist Sit to supine: +2 for physical assistance;Max assist   General bed mobility comments: assist for L leg off bed, and to lift trunk to upright with pt helping too, assist to supine  for legs and trunk, pt able to pull up in bed on his own  Transfers Overall transfer level: Needs assistance Equipment used: None Transfers: Sit to/from Stand Sit to Stand: Min assist;+2 physical assistance;From elevated surface         General transfer comment: much increased time, pt pushing from back of chair on R and footboard on L, unable to get crutches in place for him to remain standing, stood twice  Ambulation/Gait                Stairs            Wheelchair Mobility    Modified Rankin (Stroke Patients Only)       Balance Overall balance assessment: Needs assistance   Sitting balance-Leahy Scale: Fair Sitting balance - Comments: UE support most of the time, but could sit without     Standing balance-Leahy Scale: Zero Standing balance comment: could not maintain standing this date                             Pertinent Vitals/Pain Pain Assessment: 0-10 Pain Score: 2  Pain Location: R knee with attempts to stand Pain Descriptors / Indicators: Sore Pain Intervention(s): Monitored during session;Repositioned    Home Living Family/patient expects to be discharged to:: Private residence Living Arrangements: Other relatives(nephew) Available Help at Discharge: Family;Available PRN/intermittently(nephew just got a job) Type of Home: House Home Access: Stairs to enter Entrance Stairs-Rails: Can reach both Entrance Stairs-Number of Steps: flight Home Layout: One level Home Equipment: Crutches      Prior Function Level of Independence: Independent  Comments: Pt using crutches for the last week when injury occurred. Prior to this, independent. Does not work.     Hand Dominance        Extremity/Trunk Assessment   Upper Extremity Assessment Upper Extremity Assessment: Generalized weakness    Lower Extremity Assessment Lower Extremity Assessment: RLE deficits/detail;LLE deficits/detail RLE Deficits / Details: able to  bend eventually to get leg under him, but in supine unable to lift or bend on his own,  LLE Deficits / Details: AROM limited in supine with body habitus, strength hip flexion 3-/5, knee extension 4/5       Communication   Communication: No difficulties  Cognition Arousal/Alertness: Lethargic Behavior During Therapy: Flat affect Overall Cognitive Status: No family/caregiver present to determine baseline cognitive functioning                                 General Comments: slow to respond at times, lots of increased time to attempt to stand and needing lots of encouragement, home info different from when here in Oct      General Comments General comments (skin integrity, edema, etc.): many nursing staff in the room attempting to change out beds for bariatric bed for pt    Exercises     Assessment/Plan    PT Assessment Patient needs continued PT services  PT Problem List Decreased strength;Decreased balance;Decreased knowledge of use of DME;Decreased mobility;Decreased range of motion;Decreased activity tolerance;Decreased safety awareness       PT Treatment Interventions DME instruction;Functional mobility training;Balance training;Patient/family education;Therapeutic activities;Gait training;Therapeutic exercise;Stair training    PT Goals (Current goals can be found in the Care Plan section)  Acute Rehab PT Goals Patient Stated Goal: to return to independent PT Goal Formulation: With patient Time For Goal Achievement: 08/10/18 Potential to Achieve Goals: Good    Frequency Min 3X/week   Barriers to discharge        Co-evaluation               AM-PAC PT "6 Clicks" Mobility  Outcome Measure Help needed turning from your back to your side while in a flat bed without using bedrails?: A Lot Help needed moving from lying on your back to sitting on the side of a flat bed without using bedrails?: A Lot Help needed moving to and from a bed to a chair  (including a wheelchair)?: A Lot Help needed standing up from a chair using your arms (e.g., wheelchair or bedside chair)?: A Lot Help needed to walk in hospital room?: Total Help needed climbing 3-5 steps with a railing? : Total 6 Click Score: 10    End of Session Equipment Utilized During Treatment: Gait belt;Oxygen Activity Tolerance: Patient limited by pain Patient left: in bed;with nursing/sitter in room   PT Visit Diagnosis: Muscle weakness (generalized) (M62.81);Difficulty in walking, not elsewhere classified (R26.2);Other abnormalities of gait and mobility (R26.89)    Time: 0158-6825 PT Time Calculation (min) (ACUTE ONLY): 58 min   Charges:   PT Evaluation $PT Eval High Complexity: 1 High PT Treatments $Therapeutic Activity: 23-37 mins        Sheran Lawless, [facility] [phone] 07/27/2018   Elray Mcgregor 07/27/2018, 5:39 PM

## 2018-07-27 NOTE — Progress Notes (Signed)
PT Cancellation Note  Patient Details Name: Mark Baker MRN: [id number] DOB: 24-May-1981   Cancelled Treatment:    Reason Eval/Treat Not Completed: Patient at procedure or test/unavailable. Will follow-up for PT evaluation as schedule permits.  Ina Homes, PT, DPT Acute [facility] Services  Pager [phone] Office [phone]  Malachy Chamber 07/27/2018, 10:53 AM

## 2018-07-27 NOTE — Consult Note (Signed)
[facility] Renal Consultation Note  Requesting MD: Nelson Chimes Indication for Consultation: AKI  HPI:  Mark Baker is a 37 y.o. male with past medical history significant for morbid obesity, cardiomyopathy with an ejection fraction of 20 to 25% complicated by atrial fibrillation.  He was noted to undergo a right quadriceps tendon repair about 6 weeks ago and got admitted on 12/12 for the drainage from that operative site.  While here in the hospital he had an internal medicine consult on 12/14 for respiratory distress/respiratory acidosis.  He was noted to be somnolent-he was treated with BiPAP-it has been slow to improve.  Also of note he is developed acute on chronic renal failure.  It seems his baseline creatinine is between 1.3 and 1.5, was 1.56 on 12/12.  When it was checked on 12/15 was 3.79, 3.86 today.  There seem to be a time a few days ago where urine output was down but today noted to be 1500.  He is volume overloaded but has not been given diuretics.  He has had some borderline low blood pressures on very low-dose Coreg and hydralazine 50 mg 3 times daily.  He is somnolent but arousable today  Creat  Date/Time Value Ref Range Status  01/18/2015 10:45 AM 1.45 (H) 0.50 - 1.35 mg/dL Final  78/29/5621 30:86 AM 1.44 (H) 0.50 - 1.35 mg/dL Final  57/84/6962 95:28 AM 1.04 0.50 - 1.35 mg/dL Final  41/32/4401 02:72 AM 1.15 0.50 - 1.35 mg/dL Final   Creatinine, Ser  Date/Time Value Ref Range Status  07/27/2018 03:08 AM 3.86 (H) 0.61 - 1.24 mg/dL Final  53/66/4403 47:42 AM 3.79 (H) 0.61 - 1.24 mg/dL Final  59/56/3875 64:33 PM 1.56 (H) 0.61 - 1.24 mg/dL Final  29/51/8841 66:06 PM 1.44 (H) 0.61 - 1.24 mg/dL Final  30/16/0109 32:35 AM 1.52 (H) 0.61 - 1.24 mg/dL Final  57/32/2025 42:70 AM 1.63 (H) 0.61 - 1.24 mg/dL Final  62/37/6283 15:17 PM 1.42 (H) 0.61 - 1.24 mg/dL Final  61/60/7371 06:26 PM 1.35 (H) 0.76 - 1.27 mg/dL Final  94/85/4627 03:50 AM 1.31 (H) 0.61 - 1.24 mg/dL Final   09/38/1829 93:71 PM 1.13 0.61 - 1.24 mg/dL Final  69/67/8938 10:17 PM 1.20 0.61 - 1.24 mg/dL Final  51/09/5850 77:82 PM 1.41 (H) 0.61 - 1.24 mg/dL Final  42/35/3614 43:15 AM 1.30 (H) 0.76 - 1.27 mg/dL Final  40/03/6760 95:09 AM 1.44 (H) 0.61 - 1.24 mg/dL Final  32/67/1245 80:99 AM 1.43 (H) 0.61 - 1.24 mg/dL Final  83/38/2505 39:76 AM 1.54 (H) 0.61 - 1.24 mg/dL Final  73/41/9379 02:40 AM 1.41 (H) 0.61 - 1.24 mg/dL Final  97/35/3299 24:26 AM 1.38 (H) 0.61 - 1.24 mg/dL Final  83/41/9622 29:79 AM 1.38 (H) 0.76 - 1.27 mg/dL Final  89/21/1941 74:08 PM 1.59 (H) 0.61 - 1.24 mg/dL Final  14/48/1856 31:49 AM 1.73 (H) 0.61 - 1.24 mg/dL Final  70/26/3785 88:50 AM 1.83 (H) 0.61 - 1.24 mg/dL Final  27/74/1287 86:76 AM 1.71 (H) 0.61 - 1.24 mg/dL Final  72/04/4708 62:83 AM 1.79 (H) 0.61 - 1.24 mg/dL Final  66/29/4765 46:50 AM 1.75 (H) 0.61 - 1.24 mg/dL Final  35/46/5681 27:51 AM 1.62 (H) 0.61 - 1.24 mg/dL Final  70/08/7492 49:67 AM 1.65 (H) 0.61 - 1.24 mg/dL Final  59/16/3846 65:99 AM 1.58 (H) 0.61 - 1.24 mg/dL Final  35/70/1779 39:03 AM 1.49 (H) 0.61 - 1.24 mg/dL Final  00/92/3300 76:22 AM 1.49 (H) 0.61 - 1.24 mg/dL Final  63/33/5456 25:63 AM 1.44 (H) 0.61 -  1.24 mg/dL Final  16/05/9603 54:09 AM 1.73 (H) 0.61 - 1.24 mg/dL Final  81/19/1478 29:56 AM 2.07 (H) 0.61 - 1.24 mg/dL Final  21/30/8657 84:69 AM 1.74 (H) 0.61 - 1.24 mg/dL Final  62/95/2841 32:44 AM 2.02 (H) 0.61 - 1.24 mg/dL Final  08/14/7251 66:44 AM 1.37 (H) 0.61 - 1.24 mg/dL Final  03/47/4259 56:38 AM 1.46 (H) 0.61 - 1.24 mg/dL Final  75/64/3329 51:88 PM 1.51 (H) 0.61 - 1.24 mg/dL Final  41/66/0630 16:01 AM 1.45 (H) 0.61 - 1.24 mg/dL Final  09/32/3557 32:20 AM 1.34 0.50 - 1.35 mg/dL Final  25/42/7062 37:62 AM 1.55 (H) 0.50 - 1.35 mg/dL Final  83/15/1761 60:73 AM 1.34 0.50 - 1.35 mg/dL Final  71/01/2693 85:46 AM 1.32 0.50 - 1.35 mg/dL Final  27/10/5007 38:18 AM 1.23 0.50 - 1.35 mg/dL Final  29/93/7169 67:89 AM 1.25 0.50 - 1.35 mg/dL  Final  38/05/1750 02:58 AM 1.31 0.50 - 1.35 mg/dL Final  52/77/8242 35:36 AM 1.33 0.50 - 1.35 mg/dL Final  14/43/1540 08:67 AM 1.35 0.50 - 1.35 mg/dL Final  61/95/0932 67:12 PM 1.49 (H) 0.50 - 1.35 mg/dL Final  45/80/9983 38:25 AM 1.19 0.50 - 1.35 mg/dL Final  05/39/7673 41:93 PM 1.07 0.50 - 1.35 mg/dL Final  79/09/4095 35:32 AM 1.09 0.50 - 1.35 mg/dL Final     PMHx:   Past Medical History:  Diagnosis Date  . Childhood asthma    no exacerbation since age 63   . Chronic combined systolic and diastolic CHF (congestive heart failure) (HCC)    a) ECHO (11/2012): EF 40-45%, RV fx difficult to see, nl size b) ECHO (05/2014): EF 20-25%, grade 2 DD, RV mildly dilated and sys fx mod reduced  . Hypertension   . Migraine    "down to maybe 2/year now; used to have them q other week" (11/26/2017)  . NICM (nonischemic cardiomyopathy) (HCC)    a) (11/2012): normal coronaries  . OSA on CPAP    wears CPAP  . Postoperative wound infection    right knee  . Wears glasses     Past Surgical History:  Procedure Laterality Date  . CARDIOVERSION N/A 11/07/2016   Procedure: CARDIOVERSION;  Surgeon: Dolores Patty, MD;  Location: [facility] OR;  Service: Cardiovascular;  Laterality: N/A;  Will need extra help positioning patient  . INCISION AND DRAINAGE OF WOUND Right 07/23/2018   Procedure: Right knee  incisional irrigation and debridement;  Surgeon: Yolonda Kida, MD;  Location: [facility] OR;  Service: Orthopedics;  Laterality: Right;  . KNEE ARTHROSCOPY Right 2003   ANTERIOR CRUCIATE LIGAMENT REPAIR   . LEFT HEART CATHETERIZATION WITH CORONARY ANGIOGRAM N/A 11/24/2012   Procedure: LEFT HEART CATHETERIZATION WITH CORONARY ANGIOGRAM;  Surgeon: Peter M Swaziland, MD;  Location: [facility] CATH LAB;  Service: Cardiovascular;  Laterality: N/A;  . QUADRICEPS TENDON REPAIR Right 06/10/2018   Procedure: REPAIR QUADRICEP TENDON;  Surgeon: Yolonda Kida, MD;  Location: [facility] OR;  Service: Orthopedics;  Laterality: Right;   . RIGHT/LEFT HEART CATH AND CORONARY ANGIOGRAPHY N/A 03/18/2017   Procedure: Right/Left Heart Cath and Coronary Angiography;  Surgeon: Dolores Patty, MD;  Location: [facility] INVASIVE CV LAB;  Service: Cardiovascular;  Laterality: N/A;  . TEE WITHOUT CARDIOVERSION N/A 11/07/2016   Procedure: TRANSESOPHAGEAL ECHOCARDIOGRAM (TEE);  Surgeon: Dolores Patty, MD;  Location: [facility] OR;  Service: Cardiovascular;  Laterality: N/A;    Family Hx:  Family History  Problem Relation Age of Onset  . Hypertension Father   . Heart failure Father  paternal side of family   . Hypertension Sister   . Stroke Maternal Grandmother   . Hypertension Other        maternal side of family   . Hypertension Mother   . Diabetes Neg Hx   . Hyperlipidemia Neg Hx     Social History:  reports that he has never smoked. He has never used smokeless tobacco. He reports previous alcohol use. He reports that he does not use drugs.  Allergies:  Allergies  Allergen Reactions  . Aspirin Hives, Itching and Rash  . Entresto [Sacubitril-Valsartan] Cough    Was on Entresto 07/2016-09/2016    Medications: Prior to Admission medications   Medication Sig Start Date End Date Taking? Authorizing Provider  acetaminophen (TYLENOL) 500 MG tablet Take 1,000 mg by mouth as needed for mild pain or headache.   Yes [provider]  amiodarone (PACERONE) 200 MG tablet Take 1 tablet (200 mg total) by mouth 2 (two) times daily. 04/20/18  Yes Clegg, Amy D, NP  atorvastatin (LIPITOR) 40 MG tablet Take 1 tablet (40 mg total) by mouth daily. 04/20/18  Yes Clegg, Amy D, NP  carvedilol (COREG) 3.125 MG tablet Take 1 tablet (3.125 mg total) by mouth 2 (two) times daily. 04/20/18  Yes Clegg, Amy D, NP  cephALEXin (KEFLEX) 500 MG capsule Take 500 mg by mouth 4 (four) times daily.   Yes [provider]  digoxin (LANOXIN) 0.25 MG tablet Take 1 tablet (0.25 mg total) by mouth daily. 04/20/18  Yes Clegg, Amy D, NP  hydrALAZINE  (APRESOLINE) 50 MG tablet Take 1 tablet (50 mg total) by mouth every 8 (eight) hours. 04/20/18  Yes Clegg, Amy D, NP  isosorbide mononitrate (IMDUR) 60 MG 24 hr tablet Take 1 tablet (60 mg total) by mouth daily. 04/20/18  Yes Clegg, Amy D, NP  losartan (COZAAR) 25 MG tablet Take 1 tablet (25 mg total) by mouth daily. Patient taking differently: Take 25 mg by mouth every evening.  04/20/18  Yes Clegg, Amy D, NP  Potassium Chloride ER 20 MEQ TBCR Take 20 mEq by mouth daily. Patient taking differently: Take 20 mEq by mouth daily as needed (cramping).  04/20/18  Yes Clegg, Amy D, NP  sildenafil (VIAGRA) 100 MG tablet Take 1 tablet (100 mg total) by mouth daily as needed for erectile dysfunction. 04/20/18  Yes Clegg, Amy D, NP  spironolactone (ALDACTONE) 25 MG tablet Take 0.5 tablets (12.5 mg total) by mouth daily. 04/20/18  Yes Clegg, Amy D, NP  torsemide (DEMADEX) 20 MG tablet Take 40 mg (2 Tablets) in the AM and 20 mg (1 Tablet) in the PM Patient taking differently: Take 20-40 mg by mouth See admin instructions. Take 40 mg (2 Tablets) in the AM and 20 mg (1 Tablet) in the PM 04/20/18  Yes Clegg, Amy D, NP  warfarin (COUMADIN) 7.5 MG tablet Take 1 tablet (7.5 mg total) by mouth daily at 6 PM. Patient taking differently: Take 7.5 mg by mouth See admin instructions. Take 1 tablet (7.5 mg) by mouth on Mondays, Wednesdays, & Fridays at 6pm Take 0.5 tablet (3.75 mg) by mouth Sundays, Tuesdays, Thursdays, Saturdays at 6pm. 04/20/18  Yes Clegg, Amy D, NP  cephALEXin (KEFLEX) 500 MG capsule Take 1 capsule (500 mg total) by mouth 4 (four) times daily for 28 days. 07/24/18 08/21/18  Yolonda Kida, MD  HYDROcodone-acetaminophen (NORCO/VICODIN) 5-325 MG tablet Take 1-2 tablets by mouth every 4 (four) hours as needed for moderate pain (pain score 4-6).  07/24/18   Yolonda Kida, MD  oxyCODONE (ROXICODONE) 5 MG immediate release tablet Take 1-2 tablets (5-10 mg total) by mouth every 4 (four) hours as needed for moderate  pain or severe pain. Patient not taking: Reported on 07/23/2018 06/11/18   Yolonda Kida, MD    I have reviewed the patient's current medications.  Labs:  Results for orders placed or performed during the hospital encounter of 07/23/18 (from the past 48 hour(s))  Blood gas, arterial     Status: Abnormal   Collection Time: 07/25/18 11:04 PM  Result Value Ref Range   FIO2 0.40    Delivery systems BILEVEL POSITIVE AIRWAY PRESSURE    Inspiratory PAP 22    Expiratory PAP 12    pH, Arterial 7.278 (L) 7.350 - 7.450   pCO2 arterial 77.0 (HH) 32.0 - 48.0 mmHg    Comment: CRITICAL RESULT CALLED TO, READ BACK BY AND VERIFIED WITH: WENDY DUNNEVANT RRT, AT 2306 BY SHANNON WOODALL RRT, RCP ON 07/25/2018    pO2, Arterial 96.9 83.0 - 108.0 mmHg   Bicarbonate 34.9 (H) 20.0 - 28.0 mmol/L   Acid-Base Excess 8.2 (H) 0.0 - 2.0 mmol/L   O2 Saturation 96.6 %   Patient temperature 98.6    Collection site RIGHT RADIAL    Drawn by [id number]    Sample type ARTERIAL DRAW    Allens test (pass/fail) PASS PASS  Protime-INR     Status: Abnormal   Collection Time: 07/26/18  2:14 AM  Result Value Ref Range   Prothrombin Time 26.2 (H) 11.4 - 15.2 seconds   INR 2.45     Comment: Performed at [facility] Lab, [address], [place], [place] [address]  Basic metabolic panel     Status: Abnormal   Collection Time: 07/26/18  2:14 AM  Result Value Ref Range   Sodium 136 135 - 145 mmol/L   Potassium 4.0 3.5 - 5.1 mmol/L   Chloride 86 (L) 98 - 111 mmol/L   CO2 33 (H) 22 - 32 mmol/L   Glucose, Bld 107 (H) 70 - 99 mg/dL   BUN 42 (H) 6 - 20 mg/dL   Creatinine, Ser 9.81 (H) 0.61 - 1.24 mg/dL   Calcium 8.4 (L) 8.9 - 10.3 mg/dL   GFR calc non Af Amer 19 (L) >60 mL/min   GFR calc Af Amer 22 (L) >60 mL/min   Anion gap 17 (H) 5 - 15    Comment: Performed at [facility] Lab, [address], [place], [place] [address]  Magnesium     Status: None   Collection Time: 07/26/18  2:14 AM  Result Value Ref Range    Magnesium 1.9 1.7 - 2.4 mg/dL    Comment: Performed at [facility], [address], [place], [place] [address]  Blood gas, arterial     Status: Abnormal   Collection Time: 07/26/18  8:06 AM  Result Value Ref Range   FIO2 40.00    Delivery systems BILEVEL POSITIVE AIRWAY PRESSURE    Mode BILEVEL POSITIVE AIRWAY PRESSURE    LHR 20 resp/min   Inspiratory PAP 22    Expiratory PAP 12    pH, Arterial 7.320 (L) 7.350 - 7.450   pCO2 arterial 67.8 (HH) 32.0 - 48.0 mmHg    Comment: RBV RBV BECKY FLOWERS, RRT AT 0914 ON 07/26/2018 BY ASHLEY DICKENS, RRT    pO2, Arterial 122 (H) 83.0 - 108.0 mmHg   Bicarbonate 34.1 (H) 20.0 - 28.0 mmol/L  Acid-Base Excess 8.0 (H) 0.0 - 2.0 mmol/L   O2 Saturation 98.6 %   Patient temperature 98.2    Collection site RIGHT RADIAL    Drawn by [phone]    Sample type ARTERIAL DRAW    Allens test (pass/fail) PASS PASS  Osmolality     Status: Abnormal   Collection Time: 07/26/18 10:10 AM  Result Value Ref Range   Osmolality 302 (H) 275 - 295 mOsm/kg    Comment: Performed at [facility] Lab, [address], [place], [place] [address]  Osmolality, urine     Status: None   Collection Time: 07/26/18 10:13 AM  Result Value Ref Range   Osmolality, Ur 340 300 - 900 mOsm/kg    Comment: Performed at [facility], [address] [place], [place] [address]  Chloride, urine, random     Status: None   Collection Time: 07/26/18 10:13 AM  Result Value Ref Range   Chloride Urine <15 mmol/L    Comment: REPEATED TO VERIFY Performed at [facility] Lab, [address] [place], [place] [address]   Na and K (sodium & potassium), rand urine     Status: None   Collection Time: 07/26/18 10:14 AM  Result Value Ref Range   Sodium, Ur <10 mmol/L    Comment: REPEATED TO VERIFY   Potassium Urine 24 mmol/L    Comment: Performed at [facility] Lab, [address] [place], [place] [address]  Blood gas, arterial     Status: Abnormal   Collection Time: 07/26/18   4:40 PM  Result Value Ref Range   FIO2 0.30    Delivery systems BILEVEL POSITIVE AIRWAY PRESSURE    LHR 20 resp/min   Inspiratory PAP 22    Expiratory PAP 12    pH, Arterial 7.305 (L) 7.350 - 7.450   pCO2 arterial 70.6 (HH) 32.0 - 48.0 mmHg    Comment: CRITICAL RESULT CALLED TO, READ BACK BY AND VERIFIED WITH:  LISA RUDD RN @1650  BY B.FLOWERS RRT 07/26/2018    pO2, Arterial 88.5 83.0 - 108.0 mmHg   Bicarbonate 34.1 (H) 20.0 - 28.0 mmol/L   Acid-Base Excess 7.9 (H) 0.0 - 2.0 mmol/L   O2 Saturation 96.0 %   Patient temperature 98.6    Collection site RIGHT RADIAL    Drawn by [id number]    Sample type ARTERIAL    Allens test (pass/fail) PASS PASS  Blood gas, arterial     Status: Abnormal (Preliminary result)   Collection Time: 07/26/18 10:10 PM  Result Value Ref Range   FIO2 0.30    O2 Content PENDING L/min   Delivery systems BILEVEL POSITIVE AIRWAY PRESSURE    Inspiratory PAP 24    Expiratory PAP 12    pH, Arterial 7.320 (L) 7.350 - 7.450   pCO2 arterial 67.9 (HH) 32.0 - 48.0 mmHg    Comment: CRITICAL RESULT CALLED TO, READ BACK BY AND VERIFIED WITH: AGUIRRE,R RN AT 2227 ON 07/26/2018 BY DUNNAVANT, W RRT    pO2, Arterial 86.6 83.0 - 108.0 mmHg   Bicarbonate 34.0 (H) 20.0 - 28.0 mmol/L   Acid-Base Excess 7.9 (H) 0.0 - 2.0 mmol/L   O2 Saturation 96.1 %   Patient temperature 98.6    Collection site RIGHT RADIAL    Drawn by [id number]    Sample type ARTERIAL DRAW    Allens test (pass/fail) PASS PASS    Comment: Performed at [facility], [address] [place], [place] [address]  Protime-INR     Status: Abnormal   Collection Time: 07/27/18  3:08 AM  Result Value Ref Range   Prothrombin Time 26.3 (H) 11.4 - 15.2 seconds   INR 2.45     Comment: Performed at [facility], [address] [place], [place] [address]  Basic metabolic panel     Status: Abnormal   Collection Time: 07/27/18  3:08 AM  Result Value Ref Range   Sodium 136 135 - 145 mmol/L   Potassium 3.9 3.5  - 5.1 mmol/L   Chloride 87 (L) 98 - 111 mmol/L   CO2 35 (H) 22 - 32 mmol/L   Glucose, Bld 117 (H) 70 - 99 mg/dL   BUN 54 (H) 6 - 20 mg/dL   Creatinine, Ser 8.30 (H) 0.61 - 1.24 mg/dL   Calcium 8.4 (L) 8.9 - 10.3 mg/dL   GFR calc non Af Amer 19 (L) >60 mL/min   GFR calc Af Amer 22 (L) >60 mL/min   Anion gap 14 5 - 15    Comment: Performed at [facility], [address] [place], [place] [address]  Magnesium     Status: None   Collection Time: 07/27/18  3:08 AM  Result Value Ref Range   Magnesium 2.1 1.7 - 2.4 mg/dL    Comment: Performed at [facility] Lab, [address] [place], [place] [address]  Brain natriuretic peptide     Status: Abnormal   Collection Time: 07/27/18  3:08 AM  Result Value Ref Range   B Natriuretic Peptide 853.6 (H) 0.0 - 100.0 pg/mL    Comment: Performed at [facility] Lab, [address], [place], [place] [address]  Blood gas, arterial     Status: Abnormal   Collection Time: 07/27/18  8:20 AM  Result Value Ref Range   FIO2 30.00    Delivery systems BILEVEL POSITIVE AIRWAY PRESSURE    Inspiratory PAP 24    Expiratory PAP 12    pH, Arterial 7.350 7.350 - 7.450   pCO2 arterial 66.2 (HH) 32.0 - 48.0 mmHg    Comment: CRITICAL RESULT CALLED TO, READ BACK BY AND VERIFIED WITH: M. CAMPBELL, RRT, RCP AT 0835 BY M.SMITH, RRT,RCP ON 07/27/18    pO2, Arterial 97.0 83.0 - 108.0 mmHg   Bicarbonate 35.6 (H) 20.0 - 28.0 mmol/L   Acid-Base Excess 9.9 (H) 0.0 - 2.0 mmol/L   O2 Saturation 97.2 %   Patient temperature 98.6    Collection site RIGHT RADIAL    Drawn by [id number]    Sample type ARTERIAL DRAW    Allens test (pass/fail) PASS PASS     ROS:  A comprehensive review of systems was negative except for: Constitutional: positive for fatigue Cardiovascular: positive for lower extremity edema Musculoskeletal: positive for Pain at right lower extremity operative site  Physical Exam: Vitals:   07/27/18 1417 07/27/18 1540  BP:  (!) 116/95  Pulse: 82  (!) 101  Resp: 20 16  Temp:  98.2 F (36.8 C)  SpO2: 95% 90%     General: Somnolent but arousable-nursing reports he still refuses BiPAP HEENT: Pupils are equally round reactive to light, extraocular motions are intact, mucous membranes are moist Neck: Difficult to determine JVD Heart: Regular rate to tachycardic Lungs: Poor effort, decreased breath sounds at the bases Abdomen: Obese, soft Extremities: Pitting edema to left, right lower extremity in Ace wrap Skin: Warm and dry Neuro: Somnolent but arousable and easily oriented  Assessment/Plan: 37 year old black male  with morbid obesity and cardiomyopathy.  He has suffered acute kidney injury on chronic kidney disease while in the hospital for an orthopedic issue/respiratory failure 1.Renal-acute on chronic kidney disease that happened during hospitalization for musculoskeletal issue but also respiratory failure requiring rescue BiPAP.  It seemed to be at that time that his urine output dropped and his creatinine bumped.  There may have been a low blood pressure somewhere in there.  His urinalysis after injury was fairly bland only 100 of protein.  I suspect ATN secondary to transient hypotension.  It appears that urine output has increased quite a bit so I am hoping the next thing we will see is plateau in recovery of renal function.  There are no acute indications for dialysis at this time 2. Hypertension/volume  -I agree that he appears volume overloaded.  As of now he appears to be possibly auto diuresing, possibly a post injury diuresis so I will not actively give him a diuretic but will reassess in the morning 3.  Metabolic alkalosis-likely compensatory for respiratory acidosis 4. Anemia  -not a significant issue at this time 5.  Somnolence-given his decreased GFR and his very high dose of Neurontin I will hold it at this time   Cecille Aver 07/27/2018, 6:42 PM

## 2018-07-28 DIAGNOSIS — J9601 Acute respiratory failure with hypoxia: Secondary | ICD-10-CM

## 2018-07-28 DIAGNOSIS — I480 Paroxysmal atrial fibrillation: Secondary | ICD-10-CM

## 2018-07-28 DIAGNOSIS — S76111A Strain of right quadriceps muscle, fascia and tendon, initial encounter: Secondary | ICD-10-CM

## 2018-07-28 DIAGNOSIS — J9602 Acute respiratory failure with hypercapnia: Secondary | ICD-10-CM

## 2018-07-28 DIAGNOSIS — N183 Chronic kidney disease, stage 3 (moderate): Secondary | ICD-10-CM

## 2018-07-28 DIAGNOSIS — Z9981 Dependence on supplemental oxygen: Secondary | ICD-10-CM

## 2018-07-28 DIAGNOSIS — I5022 Chronic systolic (congestive) heart failure: Secondary | ICD-10-CM

## 2018-07-28 DIAGNOSIS — N179 Acute kidney failure, unspecified: Secondary | ICD-10-CM

## 2018-07-28 DIAGNOSIS — M009 Pyogenic arthritis, unspecified: Secondary | ICD-10-CM

## 2018-07-28 DIAGNOSIS — G4733 Obstructive sleep apnea (adult) (pediatric): Secondary | ICD-10-CM

## 2018-07-28 DIAGNOSIS — I10 Essential (primary) hypertension: Secondary | ICD-10-CM

## 2018-07-28 LAB — BASIC METABOLIC PANEL
Anion gap: 14 (ref 5–15)
BUN: 55 mg/dL — ABNORMAL HIGH (ref 6–20)
CHLORIDE: 87 mmol/L — AB (ref 98–111)
CO2: 34 mmol/L — ABNORMAL HIGH (ref 22–32)
Calcium: 8.3 mg/dL — ABNORMAL LOW (ref 8.9–10.3)
Creatinine, Ser: 3.19 mg/dL — ABNORMAL HIGH (ref 0.61–1.24)
GFR calc Af Amer: 27 mL/min — ABNORMAL LOW (ref >60)
GFR calc non Af Amer: 23 mL/min — ABNORMAL LOW (ref >60)
Glucose, Bld: 145 mg/dL — ABNORMAL HIGH (ref 70–99)
Potassium: 3.5 mmol/L (ref 3.5–5.1)
SODIUM: 135 mmol/L (ref 135–145)

## 2018-07-28 LAB — DIGOXIN LEVEL: Digoxin Level: 0.4 ng/mL — ABNORMAL LOW (ref 0.8–2.0)

## 2018-07-28 LAB — PROTIME-INR
INR: 2.74
Prothrombin Time: 28.6 seconds — ABNORMAL HIGH (ref 11.4–15.2)

## 2018-07-28 LAB — MAGNESIUM: Magnesium: 2.3 mg/dL (ref 1.7–2.4)

## 2018-07-28 MED ORDER — SODIUM CHLORIDE 0.9 % IV SOLN
INTRAVENOUS | Status: DC | PRN
  Administered 2018-07-28: 150 mL via INTRAVENOUS
  Administered 2018-07-29 – 2018-07-30 (×2): 250 mL via INTRAVENOUS

## 2018-07-28 MED ORDER — WARFARIN SODIUM 2.5 MG PO TABS
3.7500 mg | ORAL_TABLET | Freq: Once | ORAL | Status: AC
  Administered 2018-07-28: 3.75 mg via ORAL
  Filled 2018-07-28: qty 1

## 2018-07-28 MED ORDER — POTASSIUM CHLORIDE CRYS ER 20 MEQ PO TBCR
40.0000 meq | EXTENDED_RELEASE_TABLET | Freq: Once | ORAL | Status: AC
  Administered 2018-07-28: 40 meq via ORAL
  Filled 2018-07-28: qty 2

## 2018-07-28 NOTE — Progress Notes (Signed)
Inpatient Rehabilitation-Admissions Coordinator    Met with patient and his newphew at the bedside to discuss team's recommendation for inpatient rehabilitation. Shared booklets, expectations while in CIR, expected length of stay, and anticipated functional level at DC.   Pt is interested in pursuing CIR. He lives with his nephew who is able to provide recommended caregiver assist at DC from [facility].   Plan to follow for timing of medical readiness, insurance authorization, and IP Rehab bed availability. Call if questions.   Mark Baker, OTR/L  Rehab Admissions Coordinator  [phone] 07/28/2018 12:52 PM

## 2018-07-28 NOTE — Evaluation (Signed)
Occupational Therapy Evaluation Patient Details Name: Mark Baker MRN: [id number] DOB: [birth year] Today's Date: 07/28/2018    History of Present Illness 37 y.o. 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  Obesity with BMI greater than 60, paroxysmal atrial fibrillation was admitted to the hospital with increasing drainage from the right knee following quadricep tendon repair about 6 weeks ago.  Underwent I&D 12/12 then developed SOB and lethargy. Required bipap for hypercarbic respiratory failure.   Clinical Impression   Pt with decline in function and safety with ADLs and ADL mobility with decreased strength, balance and endurance. Pt able to sit EOB with mod A +2 and transfer to BSC min A +2. Pt requires max - total A with LB selfcare and increased time and effort during tasks to complete. Pt would benefit from acute OT services to address impairments to maximize level of function and safety    Follow Up Recommendations  CIR    Equipment Recommendations  Other (comment)(TBD at next venue of care)    Recommendations for Other Services       Precautions / Restrictions Precautions Precautions: Fall Required Braces or Orthoses: Other Brace Other Brace: ace wrap only R LE Restrictions Weight Bearing Restrictions: No RLE Weight Bearing: Weight bearing as tolerated      Mobility Bed Mobility Overal bed mobility: Needs Assistance Bed Mobility: Supine to Sit     Supine to sit: Mod assist;+2 for physical assistance     General bed mobility comments: increased time and effort; heavy use of bed rails for trunk control and upright righting  Transfers Overall transfer level: Needs assistance Equipment used: None Transfers: Sit to/from [employer] Transfers Sit to Stand: Min assist;+2 physical assistance;From elevated  surface Stand pivot transfers: Min assist;+2 physical assistance       General transfer comment: Min A +2 to power up at bedside - patient requesting use of back of chair for added stability; Min A +2 for stand pivot to [facility] for safety    Balance Overall balance assessment: Needs assistance Sitting-balance support: Bilateral upper extremity supported;Feet supported Sitting balance-Leahy Scale: Fair Sitting balance - Comments: UE support most of the time, but could sit without   Standing balance support: During functional activity;Bilateral upper extremity supported Standing balance-Leahy Scale: Poor                             ADL either performed or assessed with clinical judgement   ADL Overall ADL's : Needs assistance/impaired     Grooming: Wash/dry hands;Wash/dry face;Min guard;Sitting   Upper Body Bathing: Sitting;Minimal assistance   Lower Body Bathing: Maximal assistance   Upper Body Dressing : Minimal assistance;Sitting   Lower Body Dressing: Total assistance   Toilet Transfer: Minimal assistance;+2 for physical assistance;Cueing for safety;Stand-pivot;BSC   Toileting- Clothing Manipulation and Hygiene: Maximal assistance;Sit to/from stand       Functional mobility during ADLs: Minimal assistance;+2 for physical assistance       Vision Patient Visual Report: No change from baseline       Perception     Praxis      Pertinent Vitals/Pain Pain Assessment: No/denies pain     Hand Dominance     Extremity/Trunk Assessment             Communication     Cognition Arousal/Alertness: Awake/alert Behavior During Therapy: WFL for tasks  assessed/performed Overall Cognitive Status: Within Functional Limits for tasks assessed                                     General Comments  patient reporting improvements in mobilit as compared to yesterday; pateint left on [facility] wtih nurse/nurse tech aware of transfer status - patient  requesting to spend time on toilet as he has not had a BM in many days; call bell with patient     Exercises     Shoulder Instructions      Home Living                                          Prior Functioning/Environment                   OT Problem List: Decreased strength;Decreased activity tolerance;Decreased knowledge of use of DME or AE;Impaired balance (sitting and/or standing);Pain;Obesity      OT Treatment/Interventions: Self-care/ADL training;Therapeutic exercise;DME and/or AE instruction;Therapeutic activities;Patient/family education    OT Goals(Current goals can be found in the care plan section) Acute Rehab OT Goals Patient Stated Goal: to return to independent OT Goal Formulation: With patient Time For Goal Achievement: 08/11/18 Potential to Achieve Goals: Good  OT Frequency: Min 2X/week   Barriers to D/C: Decreased caregiver support          Co-evaluation   Reason for Co-Treatment: For patient/therapist safety;To address functional/ADL transfers PT goals addressed during session: Mobility/safety with mobility;Balance;Proper use of DME        AM-PAC OT "6 Clicks" Daily Activity     Outcome Measure Help from another person eating meals?: None Help from another person taking care of personal grooming?: A Little Help from another person toileting, which includes using toliet, bedpan, or urinal?: Total Help from another person bathing (including washing, rinsing, drying)?: A Lot Help from another person to put on and taking off regular upper body clothing?: A Little Help from another person to put on and taking off regular lower body clothing?: Total 6 Click Score: 14   End of Session Equipment Utilized During Treatment: Other (comment)(wide BSC)  Activity Tolerance: Patient tolerated treatment well Patient left: Other (comment)(on BSC with nursing)  OT Visit Diagnosis: Unsteadiness on feet (R26.81);Other abnormalities of gait  and mobility (R26.89);Muscle weakness (generalized) (M62.81);History of falling (Z91.81);Pain Pain - Right/Left: Right Pain - part of body: Leg                Time: 2831-5176 OT Time Calculation (min): 50 min Charges:  OT General Charges $OT Visit: 1 Visit OT Evaluation $OT Eval Moderate Complexity: 1 Mod    Galen Manila 07/28/2018, 12:53 PM

## 2018-07-28 NOTE — Plan of Care (Signed)
  Problem: Education: Goal: Knowledge of General Education information will improve Description Including pain rating scale, medication(s)/side effects and non-pharmacologic comfort measures Outcome: Progressing   Problem: Health Behavior/Discharge Planning: Goal: Ability to manage health-related needs will improve Outcome: Progressing   

## 2018-07-28 NOTE — Progress Notes (Signed)
ANTICOAGULATION CONSULT NOTE - Follow-Up Consult  Pharmacy Consult for warfarin Indication: atrial fibrillation  Allergies  Allergen Reactions  . Aspirin Hives, Itching and Rash  . Entresto [Sacubitril-Valsartan] Cough    Was on Entresto 07/2016-09/2016    Patient Measurements: Height: 6\' 3"  (190.5 cm) Weight: (unable to get pt's weight/ bed transfer ) IBW/kg (Calculated) : 84.5   Vital Signs: Temp: 98.3 F (36.8 C) (12/17 1146) Temp Source: Oral (12/17 1146) BP: 133/101 (12/17 1146) Pulse Rate: 77 (12/17 1146)  Labs: Recent Labs    07/26/18 0214 07/27/18 0308 07/28/18 0321  LABPROT 26.2* 26.3* 28.6*  INR 2.45 2.45 2.74  CREATININE 3.79* 3.86* 3.19*    Estimated Creatinine Clearance: 64.9 mL/min (A) (by C-G formula based on SCr of 3.19 mg/dL (H)).   Medical History: Past Medical History:  Diagnosis Date  . Childhood asthma    Mark exacerbation since age 37   . Chronic combined systolic and diastolic CHF (congestive heart failure) (HCC)    a) ECHO (11/2012): EF 40-45%, RV fx difficult to see, nl size b) ECHO (05/2014): EF 20-25%, grade 2 DD, RV mildly dilated and sys fx mod reduced  . Hypertension   . Migraine    "down to maybe 2/year now; used to have them q other week" (11/26/2017)  . NICM (nonischemic cardiomyopathy) (HCC)    a) (11/2012): normal coronaries  . OSA on CPAP    wears CPAP  . Postoperative wound infection    right knee  . Wears glasses      Assessment: 37 yoM with morbidy obesity and h/o PAF admitted for R knee I&D. Pt on warfarin PTA for h/o PAF which was initially resumed by surgical MD with plans for discharge 12/14 on home regimen. Pt decompensated with respiratory insufficiency requiring BiPAP and will remain admitted with hospitalist service, pharmacy consulted  to take dose warfarin.   PTA Dose = 7.5mg  Mon/Wed/Fri, 3.75mg  all other days (LD pta taken 07/20/18)  INR 1.69 on admit 07/23/18  Today the INR is 2.74 therapeutic,  Missed warf  dose 12/14 d/t AMS.   Amiodarone 200 mg bid on PTA/continues.   Hgb 11.7, pltc wnl on admit 07/23/18.  Mark bleeding noted   Goal of Therapy:  INR 2-3 Monitor platelets by anticoagulation protocol: Yes   Plan:  -Warfarin 3.75mg  PO x1 tonight -Daily protime -check CBC in AM  Noah Delaine, RPh Clinical Pharmacist Pager: [phone] Please check AMION for all [facility] numbers 07/28/2018

## 2018-07-28 NOTE — Consult Note (Signed)
Physical Medicine and Rehabilitation Consult   Reason for Consult: Debility Referring Physician: Dr. Nelson Chimes   HPI: Mark Baker is a 37 y.o. male with history of morbid obesity, OSA--non compliant with CPAP, PAF, HTN, CKD, CM with chronic combined CHF, recent right quad tendon repair 6 weels PTA who developed purulent drainage due to post op infection and was admitted on 07/24/18 for I and D right knee by Dr. Aundria Rud.  History taken from chart review and patient.  Post op course complicated by acute on chronic hypoxic/hypercarbic respiratory failure with somnolence requiring BIPAP, A fib,  AKI with rise in SCr to 3.86, hypotension as well as  fluid overload due to cardiorenal syndrome. 2 D echo done revealing EF 25-30% --difficult study due to body habitus.  Dr. Kathrene Bongo consulted yesterday for input and felt that patient with  ATN due to hypotension and metabolic alkalosis likely compensatory due to respiratory acidosis.  UOP improving and no indications for dialysis at this time. Somnolence resolving and he was cleared for therapy evaluation today. Patient noted be debilitated and CIR due to functional decline.    Review of Systems  Constitutional: Negative for chills and fever.  HENT: Negative for hearing loss and tinnitus.   Eyes: Negative for blurred vision and double vision.  Respiratory: Positive for cough and wheezing.   Cardiovascular: Positive for leg swelling. Negative for chest pain and palpitations.  Gastrointestinal: Negative for heartburn and nausea.  Genitourinary: Negative for dysuria and urgency.  Musculoskeletal: Positive for joint pain and myalgias.  Skin: Negative for itching and rash.  Neurological: Positive for weakness.  Psychiatric/Behavioral: Negative for depression.  All other systems reviewed and are negative.     Past Medical History:  Diagnosis Date  . Childhood asthma    no exacerbation since age 63   . Chronic combined systolic and diastolic CHF  (congestive heart failure) (HCC)    a) ECHO (11/2012): EF 40-45%, RV fx difficult to see, nl size b) ECHO (05/2014): EF 20-25%, grade 2 DD, RV mildly dilated and sys fx mod reduced  . Hypertension   . Migraine    "down to maybe 2/year now; used to have them q other week" (11/26/2017)  . NICM (nonischemic cardiomyopathy) (HCC)    a) (11/2012): normal coronaries  . OSA on CPAP    wears CPAP  . Postoperative wound infection    right knee  . Wears glasses     Past Surgical History:  Procedure Laterality Date  . CARDIOVERSION N/A 11/07/2016   Procedure: CARDIOVERSION;  Surgeon: Dolores Patty, MD;  Location: [facility] OR;  Service: Cardiovascular;  Laterality: N/A;  Will need extra help positioning patient  . INCISION AND DRAINAGE OF WOUND Right 07/23/2018   Procedure: Right knee  incisional irrigation and debridement;  Surgeon: Yolonda Kida, MD;  Location: [facility] OR;  Service: Orthopedics;  Laterality: Right;  . KNEE ARTHROSCOPY Right 2003   ANTERIOR CRUCIATE LIGAMENT REPAIR   . LEFT HEART CATHETERIZATION WITH CORONARY ANGIOGRAM N/A 11/24/2012   Procedure: LEFT HEART CATHETERIZATION WITH CORONARY ANGIOGRAM;  Surgeon: Peter M Swaziland, MD;  Location: [facility] CATH LAB;  Service: Cardiovascular;  Laterality: N/A;  . QUADRICEPS TENDON REPAIR Right 06/10/2018   Procedure: REPAIR QUADRICEP TENDON;  Surgeon: Yolonda Kida, MD;  Location: [facility] OR;  Service: Orthopedics;  Laterality: Right;  . RIGHT/LEFT HEART CATH AND CORONARY ANGIOGRAPHY N/A 03/18/2017   Procedure: Right/Left Heart Cath and Coronary Angiography;  Surgeon: Dolores Patty, MD;  Location:  [facility] INVASIVE CV LAB;  Service: Cardiovascular;  Laterality: N/A;  . TEE WITHOUT CARDIOVERSION N/A 11/07/2016   Procedure: TRANSESOPHAGEAL ECHOCARDIOGRAM (TEE);  Surgeon: Dolores Patty, MD;  Location: [facility] OR;  Service: Cardiovascular;  Laterality: N/A;    Family History  Problem Relation Age of Onset  . Hypertension Father   . Heart failure  Father        paternal side of family   . Hypertension Sister   . Stroke Maternal Grandmother   . Hypertension Other        maternal side of family   . Hypertension Mother   . Diabetes Neg Hx   . Hyperlipidemia Neg Hx     Social History:  Divorced. Nephew lives with him--starting a job this week. He stayed with his mother past last surgery--reports nephew will be with him during the day and daughter will be there at nights. 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 He reports that he has never smoked. He has never used smokeless tobacco. He reports previous alcohol use--none X 1 year. He reports that he does not use drugs.   Allergies  Allergen Reactions  . Aspirin Hives, Itching and Rash  . Entresto [Sacubitril-Valsartan] Cough    Was on Entresto 07/2016-09/2016    Medications Prior to Admission  Medication Sig Dispense Refill  . acetaminophen (TYLENOL) 500 MG tablet Take 1,000 mg by mouth as needed for mild pain or headache.    Marland Kitchen amiodarone (PACERONE) 200 MG tablet Take 1 tablet (200 mg total) by mouth 2 (two) times daily. 60 tablet 11  . atorvastatin (LIPITOR) 40 MG tablet Take 1 tablet (40 mg total) by mouth daily. 30 tablet 11  . carvedilol (COREG) 3.125 MG tablet Take 1 tablet (3.125 mg total) by mouth 2 (two) times daily. 60 tablet 11  . cephALEXin (KEFLEX) 500 MG capsule Take 500 mg by mouth 4 (four) times daily.    . digoxin (LANOXIN) 0.25 MG tablet Take 1 tablet (0.25 mg total) by mouth daily. 30 tablet 11  . hydrALAZINE (APRESOLINE) 50 MG tablet Take 1 tablet (50 mg total) by mouth every 8 (eight) hours. 90 tablet 11  . isosorbide mononitrate (IMDUR) 60 MG 24 hr tablet Take 1 tablet (60 mg total) by mouth daily. 30 tablet 11  . losartan (COZAAR) 25 MG tablet Take 1 tablet (25 mg total) by mouth daily. (Patient taking differently: Take 25 mg by mouth every evening. ) 30 tablet 11  . Potassium Chloride ER 20 MEQ TBCR Take 20 mEq  by mouth daily. (Patient taking differently: Take 20 mEq by mouth daily as needed (cramping). ) 30 tablet 11  . sildenafil (VIAGRA) 100 MG tablet Take 1 tablet (100 mg total) by mouth daily as needed for erectile dysfunction. 5 tablet 3  . spironolactone (ALDACTONE) 25 MG tablet Take 0.5 tablets (12.5 mg total) by mouth daily. 15 tablet 11  . torsemide (DEMADEX) 20 MG tablet Take 40 mg (2 Tablets) in the AM and 20 mg (1 Tablet) in the PM (Patient taking differently: Take 20-40 mg by mouth See admin instructions. Take 40 mg (2 Tablets) in the AM and 20 mg (1 Tablet) in the PM) 90 tablet 11  . warfarin (COUMADIN) 7.5 MG tablet Take 1 tablet (7.5 mg total) by mouth daily at 6 PM. (Patient taking differently: Take 7.5 mg by mouth See admin instructions. Take 1 tablet (7.5 mg) by mouth on Mondays, Wednesdays, & Fridays  at 6pm Take 0.5 tablet (3.75 mg) by mouth Sundays, Tuesdays, Thursdays, Saturdays at 6pm.) 45 tablet 6  . oxyCODONE (ROXICODONE) 5 MG immediate release tablet Take 1-2 tablets (5-10 mg total) by mouth every 4 (four) hours as needed for moderate pain or severe pain. (Patient not taking: Reported on 07/23/2018) 45 tablet 0    Home: Home Living Family/patient expects to be discharged to:: Private residence Living Arrangements: Other relatives(nephew) Available Help at Discharge: Family, Available PRN/intermittently(nephew just got a job) Type of Home: House Home Access: Stairs to enter Secretary/administrator of Steps: flight Entrance Stairs-Rails: Can reach both Home Layout: One level Home Equipment: Crutches  Functional History: Prior Function Level of Independence: Independent Comments: Pt using crutches for the last week when injury occurred. Prior to this, independent. Does not work. Functional Status:  Mobility: Bed Mobility Overal bed mobility: Needs Assistance Bed Mobility: Supine to Sit, Sit to Supine Supine to sit: +2 for physical assistance, Mod assist Sit to supine: +2  for physical assistance, Max assist General bed mobility comments: assist for L leg off bed, and to lift trunk to upright with pt helping too, assist to supine for legs and trunk, pt able to pull up in bed on his own Transfers Overall transfer level: Needs assistance Equipment used: None Transfers: Sit to/from Stand Sit to Stand: Min assist, +2 physical assistance, From elevated surface General transfer comment: much increased time, pt pushing from back of chair on R and footboard on L, unable to get crutches in place for him to remain standing, stood twice      ADL:    Cognition: Cognition Overall Cognitive Status: No family/caregiver present to determine baseline cognitive functioning Orientation Level: Oriented X4 Cognition Arousal/Alertness: Lethargic Behavior During Therapy: Flat affect Overall Cognitive Status: No family/caregiver present to determine baseline cognitive functioning General Comments: slow to respond at times, lots of increased time to attempt to stand and needing lots of encouragement, home info different from when here in Oct   Blood pressure (!) 129/91, pulse 79, temperature 97.6 F (36.4 C), temperature source Oral, resp. rate (!) 25, height 6\' 3"  (1.905 m), weight (!) 238.6 kg, SpO2 98 %. Physical Exam  Nursing note and vitals reviewed. Constitutional: He is oriented to person, place, and time. He appears well-developed. No distress. Nasal cannula in place.  Morbidly obese male. Alert and conversant. NAD  HENT:  Head: Normocephalic and atraumatic.  Eyes: EOM are normal. Right eye exhibits no discharge. Left eye exhibits no discharge.  Neck: Normal range of motion. Neck supple.  Cardiovascular: Normal rate and regular rhythm.  Respiratory: Effort normal. He has wheezes (occasional).  + [place]  GI: Soft. Bowel sounds are normal.  Musculoskeletal:        General: Tenderness (Right knee) and edema present.     Comments: Right knee with ace wrap. 2+ edema BLE  and bilateral hands.   Neurological: He is alert and oriented to person, place, and time.  Speech clear.  Motor: Bilateral upper extremities: 5/5 proximal and distal Left lower extremity: Hip flexion 4/5, knee extension, ankle dorsiflexion 4+/5 Right lower extremity: Hip flexion 2/5, knee dressed, ankle dorsiflexion 4/5 (some pain inhibition)  Skin: He is not diaphoretic.  See above Right knee with dressing C/D/I  Psychiatric: He has a normal mood and affect. His behavior is normal. Thought content normal.    Results for orders placed or performed during the hospital encounter of 07/23/18 (from the past 24 hour(s))  Protime-INR  Status: Abnormal   Collection Time: 07/28/18  3:21 AM  Result Value Ref Range   Prothrombin Time 28.6 (H) 11.4 - 15.2 seconds   INR 2.74   Basic metabolic panel     Status: Abnormal   Collection Time: 07/28/18  3:21 AM  Result Value Ref Range   Sodium 135 135 - 145 mmol/L   Potassium 3.5 3.5 - 5.1 mmol/L   Chloride 87 (L) 98 - 111 mmol/L   CO2 34 (H) 22 - 32 mmol/L   Glucose, Bld 145 (H) 70 - 99 mg/dL   BUN 55 (H) 6 - 20 mg/dL   Creatinine, Ser 1.61 (H) 0.61 - 1.24 mg/dL   Calcium 8.3 (L) 8.9 - 10.3 mg/dL   GFR calc non Af Amer 23 (L) >60 mL/min   GFR calc Af Amer 27 (L) >60 mL/min   Anion gap 14 5 - 15  Magnesium     Status: None   Collection Time: 07/28/18  3:21 AM  Result Value Ref Range   Magnesium 2.3 1.7 - 2.4 mg/dL  Digoxin level     Status: Abnormal   Collection Time: 07/28/18  3:21 AM  Result Value Ref Range   Digoxin Level 0.4 (L) 0.8 - 2.0 ng/mL   US Renal  Result Date: 07/26/2018 CLINICAL DATA:  Acute renal insufficiency. EXAM: RENAL / URINARY TRACT ULTRASOUND COMPLETE COMPARISON:  None. FINDINGS: Right Kidney: Renal measurements: 12.3 x 8 x 7.6 cm = volume: 392.1 mL. Limited evaluation. Not well visualized. Left Kidney: Renal measurements: 11.5 x 7.3 x 9.5 cm = volume: 414.9 mL. Not well visualized. Limited evaluation. Bladder: Not  visualized. IMPRESSION: 1. Severely limited study due to patient body habitus. No gross renal abnormalities noted on limited images. Electronically Signed   By: Gerome Sam III M.D   On: 07/26/2018 14:57   Dg Chest Port 1 View  Result Date: 07/26/2018 CLINICAL DATA:  Acute renal insufficiency. EXAM: PORTABLE CHEST 1 VIEW COMPARISON:  July 25, 2018 FINDINGS: Marked enlargement of the cardiac silhouette, unchanged. No pneumothorax. No overt edema. No focal infiltrate. IMPRESSION: Marked enlargement the cardiac silhouette, stable. No overt edema or other acute abnormalities. Electronically Signed   By: Gerome Sam III M.D   On: 07/26/2018 10:01    Assessment/Plan: Diagnosis: Debility Labs and images (see above) independently reviewed.  Records reviewed and summated above.  1. Does the need for close, 24 hr/day medical supervision in concert with the patient's rehab needs make it unreasonable for this patient to be served in a less intensive setting? Yes  2. Co-Morbidities requiring supervision/potential complications: Combined CHF (Monitor in accordance with increased physical activity and avoid UE resistance excercises), AKI (avoid nephrotoxic meds), acute on chronic hypoxic/hypercarbic respiratory failure (wean supplemental oxygen as tolerated),  morbid obesity (encourage weight loss), OSA--non compliant with CPAP, PAF, HTN (monitor and provide prns in accordance with increased physical exertion and pain), CKD, recent right quad tendon repair 6 weels 3. Due to bowel management, safety, skin/wound care, disease management, pain management and patient education, does the patient require 24 hr/day rehab nursing? Yes 4. Does the patient require coordinated care of a physician, rehab nurse, PT (1-2 hrs/day, 5 days/week) and OT (1-2 hrs/day, 5 days/week) to address physical and functional deficits in the context of the above medical diagnosis(es)? Yes Addressing deficits in the following areas:  balance, endurance, locomotion, strength, transferring, bathing, dressing, toileting and psychosocial support 5. Can the patient actively participate in an intensive therapy program of at least  3 hrs of therapy per day at least 5 days per week? Potentially 6. The potential for patient to make measurable gains while on inpatient rehab is excellent 7. Anticipated functional outcomes upon discharge from inpatient rehab are min assist  with PT, min assist and mod assist with OT, n/a with SLP. 8. Estimated rehab length of stay to reach the above functional goals is: 13-17 days. 9. Anticipated D/C setting: Home 10. Anticipated post D/C treatments: HH therapy and Home excercise program 11. Overall Rehab/Functional Prognosis: excellent and good  RECOMMENDATIONS: This patient's condition is appropriate for continued rehabilitative care in the following setting: CIR Patient has agreed to participate in recommended program. Yes Note that insurance prior authorization may be required for reimbursement for recommended care.  Comment: Rehab Admissions Coordinator to follow up.   I have personally performed a face to face diagnostic evaluation, including, but not limited to relevant history and physical exam findings, of this patient and developed relevant assessment and plan.  Additionally, I have reviewed and concur with the physician assistant's documentation above.   Maryla Morrow, MD, ABPMR Jacquelynn Cree, PA-C 07/28/2018

## 2018-07-28 NOTE — Progress Notes (Signed)
   Subjective: 5 Days Post-Op Procedure(s) (LRB): Right knee  incisional irrigation and debridement (Right)  Pt complaining of no energy, but denies SOB, or HA. Appreciate internal medicine involvement Right knee stable at this point  Patient reports pain as mild.  Objective:   VITALS:   Vitals:   07/28/18 1146 07/28/18 1514  BP: (!) 133/101 (!) 151/77  Pulse: 77   Resp: 16   Temp: 98.3 F (36.8 C)   SpO2:      Right knee dressing intact, no drainage at incision. No rashes distally Wiggles toes No pain with PROM   LABS No results for input(s): HGB, HCT, WBC, PLT in the last 72 hours.  Recent Labs    07/26/18 0214 07/27/18 0308 07/28/18 0321  NA 136 136 135  K 4.0 3.9 3.5  BUN 42* 54* 55*  CREATININE 3.79* 3.86* 3.19*  GLUCOSE 107* 117* 145*     Assessment/Plan: 5 Days Post-Op Procedure(s) (LRB): Right knee  incisional irrigation and debridement (Right) From knee standpoint, ok for daily dry dressing changes -continue cefazolin for MSSA right knee infection - ok for WBAT to RLE with assistive device  - appreciate nephro, hospitalists, and PCCM for there input and management of critical medical problems - CIR bed has been recommended, we will await transfer when able     Yolonda Kida

## 2018-07-28 NOTE — PMR Pre-admission (Addendum)
PMR Admission Coordinator Pre-Admission Assessment  Patient: Mark Baker is an 37 y.o., male MRN: [id number] DOB: 03/26/1981 Height: '6\' 3"'$  (190.5 cm) Weight: (unable to get pt's weight/ bed transfer )              Insurance Information HMO:    PPO:      PCP:      IPA:      80/20:      OTHER:  PRIMARY: [employer]      Policy#: [id number]      Subscriber: Patient CM Name: Mark Baker     Phone#: [phone] [id number]     Fax#:  [phone] Pre-Cert#: [id number]       Employer:  Josem Kaufmann [phone]) provided by Butch Penny at [place] on 07/29/18. Pt is approved from 12/18 through 12/24. Clinical updates are due to [facility] at [facility]): [phone] [id number]; (f): [phone] Benefits:  Phone #: NA     Name: Navinet online portal Eff. Date: 08/12/17-08/11/18     Deduct: $1,500 (met $1,500)      Out of Pocket Max: $3,000 (Met $3,000)      Life Max: NA CIR: 90%/10%      SNF: 90%/10%; complete care managed (requires prior auth) Outpatient: 90% (auth req. 60/ST, 30/OT, 30/PT) Co-Pay: 10%  Home Health: 90%(120 visits, auth not req)      Co-Pay: 10% DME: 90%     Co-Pay: 10% Providers:  SECONDARY:       Policy#:       Subscriber:  CM Name:       Phone#:      Fax#:  Pre-Cert#:       Employer:  Benefits:  Phone #:      Name:  Eff. Date:      Deduct:       Out of Pocket Max:       Life Max:  CIR:       SNF:  Outpatient:      Co-Pay:  Home Health:       Co-Pay: DME:      Co-Pay:   Medicaid Application Date:      Case Manager:  Disability Application Date:       Case Worker:   Emergency Facilities manager Information    Name Relation Home Work [place] Other [phone]  [phone]     Current Medical History  Patient Admitting Diagnosis: Debility  History of Present Illness: Mark Baker is a 37 y.o. male with history of morbid obesity, OSA--non compliant with CPAP, PAF, HTN, CKD, CM with chronic combined CHF, recent right quad tendon repair 6 weeks PTA who developed  purulent drainage due to post op infection and was admitted on 07/24/18 for I and D right knee by Dr. Stann Mainland.  History taken from chart review and patient.  Post op course complicated by acute on chronic hypoxic/hypercarbic respiratory failure with somnolence requiring BIPAP, A fib,  AKI with rise in SCr to 3.86, hypotension as well as  fluid overload due to cardiorenal syndrome. 2 D echo done revealing EF 25-30% --difficult study due to body habitus.  Dr. Moshe Cipro consulted for input and felt that patient with  ATN due to hypotension and metabolic alkalosis likely compensatory due to respiratory acidosis.  UOP improving and no indications for dialysis at this time. Somnolence resolving and he was cleared for therapy evaluation. Patient noted be debilitated and CIR was consulted due to functional decline. Pt is to be admitted to CIR on 07/30/18.  Past Medical History  Past Medical History:  Diagnosis Date  . Childhood asthma    no exacerbation since age 89   . Chronic combined systolic and diastolic CHF (congestive heart failure) ([place])    a) ECHO (11/2012): EF 40-45%, RV fx difficult to see, nl size b) ECHO (05/2014): EF 20-25%, grade 2 DD, RV mildly dilated and sys fx mod reduced  . Hypertension   . Migraine    "down to maybe 2/year now; used to have them q other week" (11/26/2017)  . NICM (nonischemic cardiomyopathy) ([place])    a) (11/2012): normal coronaries  . OSA on CPAP    wears CPAP  . Postoperative wound infection    right knee  . Wears glasses     Family History  family history includes Heart failure in his father; Hypertension in his father, mother, sister, and another family member; Stroke in his maternal grandmother.  Prior Rehab/Hospitalizations:  Has the patient had major surgery during 100 days prior to admission? Yes  Current Medications   Current Facility-Administered Medications:  .  0.9 %  sodium chloride infusion, , Intravenous, PRN, Corliss Parish, MD,  Last Rate: 10 mL/hr at 07/30/18 0449, 250 mL at 07/30/18 0449 .  acetaminophen (TYLENOL) tablet 325-650 mg, 325-650 mg, Oral, Q6H PRN, Nicholes Stairs, MD, 650 mg at 07/30/18 0444 .  amiodarone (PACERONE) tablet 200 mg, 200 mg, Oral, BID, Nicholes Stairs, MD, 200 mg at 07/30/18 1007 .  atorvastatin (LIPITOR) tablet 40 mg, 40 mg, Oral, Daily, Nicholes Stairs, MD, 40 mg at 07/30/18 1007 .  carvedilol (COREG) tablet 6.25 mg, 6.25 mg, Oral, BID, Corliss Parish, MD, 6.25 mg at 07/30/18 1006 .  ceFAZolin (ANCEF) IVPB 2g/100 mL premix, 2 g, Intravenous, Q8H, Nicholes Stairs, MD, Last Rate: 200 mL/hr at 07/30/18 1402, 2 g at 07/30/18 1402 .  digoxin (LANOXIN) tablet 0.25 mg, 0.25 mg, Oral, Daily, Nicholes Stairs, MD, 0.25 mg at 07/30/18 1007 .  docusate sodium (COLACE) capsule 100 mg, 100 mg, Oral, BID, Nicholes Stairs, MD, 100 mg at 07/28/18 2132 .  guaiFENesin (MUCINEX) 12 hr tablet 600 mg, 600 mg, Oral, BID PRN, Amin, Ankit Chirag, MD, 600 mg at 07/30/18 1212 .  hydrALAZINE (APRESOLINE) tablet 50 mg, 50 mg, Oral, Q8H, Nicholes Stairs, MD, 50 mg at 07/30/18 1359 .  isosorbide mononitrate (IMDUR) 24 hr tablet 60 mg, 60 mg, Oral, Daily, Nicholes Stairs, MD, 60 mg at 07/30/18 1006 .  magnesium oxide (MAG-OX) tablet 800 mg, 800 mg, Oral, Once, Amin, Ankit Chirag, MD .  MEDLINE mouth rinse, 15 mL, Mouth Rinse, BID, Stann Mainland Elly Modena, MD, 15 mL at 07/30/18 1008 .  naloxone [facility]) injection 0.4 mg, 0.4 mg, Intravenous, PRN, Einar Grad, RPH, 0.4 mg at 07/25/18 1903 .  ondansetron (ZOFRAN) tablet 4 mg, 4 mg, Oral, Q6H PRN **OR** ondansetron (ZOFRAN) injection 4 mg, 4 mg, Intravenous, Q6H PRN, Nicholes Stairs, MD .  potassium chloride (K-DUR) CR tablet 20 mEq, 20 mEq, Oral, Daily PRN, Nicholes Stairs, MD .  torsemide [facility]) tablet 20 mg, 20 mg, Oral, Daily, Corliss Parish, MD, 20 mg at 07/30/18 1007 .  warfarin (COUMADIN)  tablet 3.75 mg, 3.75 mg, Oral, ONCE-1800, Franky Macho, RPH .  Warfarin - Pharmacist Dosing Inpatient, , Does not apply, q1800, Einar Grad, RPH  Patients Current Diet:  Diet Order            Diet - low sodium heart healthy  Diet regular Room service appropriate? Yes; Fluid consistency: Thin  Diet effective now              Precautions / Restrictions Precautions Precautions: Fall Other Brace: ace wrap only R LE Restrictions Weight Bearing Restrictions: No RLE Weight Bearing: Weight bearing as tolerated   Has the patient had 2 or more falls or a fall with injury in the past year?Yes  Prior Activity Level Limited Community (1-2x/wk): was working prior to knee injury in september; since injury, not worked and gets out approx 1x/week; does Product manager / Paramedic Devices/Equipment: Science writer: Crutches  Prior Device Use: Indicate devices/aids used by the patient prior to current illness, exacerbation or injury? bilateral axillary crutches  Prior Functional Level Prior Function Level of Independence: Independent Comments: Pt using crutches for the last week when injury occurred. Prior to this, independent. Does not work.  Self Care: Did the patient need help bathing, dressing, using the toilet or eating?  Independent  Indoor Mobility: Did the patient need assistance with walking from room to room (with or without device)? Independent  Stairs: Did the patient need assistance with internal or external stairs (with or without device)? Independent  Functional Cognition: Did the patient need help planning regular tasks such as shopping or remembering to take medications? Independent  Current Functional Level Cognition  Overall Cognitive Status: Within Functional Limits for tasks assessed Orientation Level: Oriented X4 General Comments: slow to respond at times, lots of increased time to attempt to stand and needing  lots of encouragement, home info different from when here in Oct    Extremity Assessment (includes Sensation/Coordination)  Upper Extremity Assessment: Generalized weakness  Lower Extremity Assessment: RLE deficits/detail, LLE deficits/detail RLE Deficits / Details: able to bend eventually to get leg under him, but in supine unable to lift or bend on his own,  LLE Deficits / Details: AROM limited in supine with body habitus, strength hip flexion 3-/5, knee extension 4/5    ADLs  Overall ADL's : Needs assistance/impaired Grooming: Wash/dry hands, Wash/dry face, Min guard, Standing Upper Body Bathing: Sitting, Min guard Lower Body Bathing: Moderate assistance, Sitting/lateral leans Upper Body Dressing : Sitting, Min guard Lower Body Dressing: Maximal assistance, Sit to/from stand Lower Body Dressing Details (indicate cue type and reason): simulated, unable to donn socks Toilet Transfer: Cueing for safety, Supervision/safety, Regular Toilet, Comfort height toilet, Grab bars, Ambulation(used crutches) Toileting- Clothing Manipulation and Hygiene: Sit to/from stand, Minimal assistance Functional mobility during ADLs: Supervision/safety, Cueing for safety(cruthces) General ADL Comments: pt required increased time and effort due to Poor endurance    Mobility  Overal bed mobility: Needs Assistance Bed Mobility: Supine to Sit Supine to sit: Mod assist, +2 for physical assistance Sit to supine: +2 for physical assistance, Max assist General bed mobility comments: up in chair upon OT arrival    Transfers  Overall transfer level: Needs assistance Equipment used: None Transfers: Sit to/from Stand Sit to Stand: Supervision Stand pivot transfers: Min assist, +2 physical assistance General transfer comment: pt was able to stand with his UE's from recliner    Ambulation / Gait / Stairs / Emergency planning/management officer  Ambulation/Gait Ambulation/Gait assistance: Scientist, forensic (Feet): 40  Feet Assistive device: Crutches, 1 person hand held assist Gait Pattern/deviations: Step-through pattern, Step-to pattern, Wide base of support, Trunk flexed General Gait Details: gave pt instructions not to lean axillae on the crutches Gait velocity: reduced Gait velocity interpretation: <1.31 ft/sec, indicative of household ambulator  Posture / Balance Dynamic Sitting Balance Sitting balance - Comments: UE support most of the time, but could sit without Balance Overall balance assessment: Needs assistance Sitting-balance support: Bilateral upper extremity supported, Feet supported Sitting balance-Leahy Scale: Good Sitting balance - Comments: UE support most of the time, but could sit without Standing balance support: Bilateral upper extremity supported, During functional activity Standing balance-Leahy Scale: Fair Standing balance comment: could not maintain standing this date    Special needs/care consideration BiPAP/CPAP: yes Bipap at night in hospital ; CPAP at home CPM: no Continuous Drip IV: 0.9% sodium chloride unfusion Dialysis: no        Days: NA Life Vest: no Oxygen: RA on 12/18 vs 2L on 12/18 as well Special Bed: no bariatric air mattress Trach Size: NA Wound Vac (area): no      Location: no Skin: skin tear on right buttocks; right leg surgical incision                       Bowel mgmt:continent, last BM: 07/29/18 Bladder mgmt: external catheter in place Diabetic mgmt: no     Previous Home Environment Living Arrangements: Other relatives(nephew) Available Help at Discharge: Family, Available PRN/intermittently(nephew just got a job) Type of Home: House Home Layout: One level Home Access: Stairs to enter Entrance Stairs-Rails: Can reach both Entrance Stairs-Number of Steps: [place]: No  Discharge Living Setting Plans for Discharge Living Setting: Patient's home, Lives with (comment)(with nephew and 22 yo daughter) Type of Home at Discharge:  Other (Comment)(townhome) Discharge Home Layout: Two level, 1/2 bath on main level, Bed/bath upstairs Alternate Level Stairs-Rails: (unknown but full flight of stairs) Alternate Level Stairs-Number of Steps: full flight Discharge Home Access: Stairs to enter Entrance Stairs-Rails: Left Entrance Stairs-Number of Steps: 4-5 Discharge Bathroom Shower/Tub: Tub/shower unit Discharge Bathroom Toilet: Standard Discharge Bathroom Accessibility: Yes How Accessible: Accessible via walker(possibly if walker turned sideways; PTA used crutches) Does the patient have any problems obtaining your medications?: No  Social/Family/Support Systems Patient Roles: Parent Contact Information: newphew ([place]: [phone]); mother Scot Jun: [phone]); mother in law Rollene Fare [place]: [phone]) Anticipated Caregiver: newphew and friends  Anticipated Caregiver's Contact Information: see above  Ability/Limitations of Caregiver: newphew able to provide min/mod A Caregiver Availability: Other (Comment)(newphew can do days as he works nights (may have PRN nights)) Discharge Plan Discussed with Primary Caregiver: Yes(pt and his nephew) Is Caregiver In Agreement with Plan?: Yes Does Caregiver/Family have Issues with Lodging/Transportation while Pt is in Rehab?: No   Goals/Additional Needs Patient/Family Goal for Rehab: PT: Min A; OT: Min/Mod A; SLP: NA Expected length of stay: 13-17 days Cultural Considerations: NA Dietary Needs: Regular diet, thin liquids Equipment Needs: TBD Special Service Needs: bariatric bed, bariatric equipment Pt/Family Agrees to Admission and willing to participate: Yes Program Orientation Provided & Reviewed with Pt/Caregiver Including Roles  & Responsibilities: Yes(pt and his nephew)  Barriers to Discharge: Home environment access/layout, New oxygen(pt will need to manage full flight of stairs for bed/bath)  Barriers to Discharge Comments: will need to have assist during pt's  nephew's nighttime shift   Decrease burden of Care through IP rehab admission: NA   Possible need for SNF placement upon discharge: not anticipated; pt has good support from family and friends and has good prognosis for further progress with CIR.    Patient Condition: This patient's medical and functional status has changed since the consult dated: 12/17 in which the Rehabilitation Physician determined and documented that the patient's  condition is appropriate for intensive rehabilitative care in an inpatient rehabilitation facility. See "History of Present Illness" (above) for medical update. Functional changes are: improvement in bed mobility from Max Ax2 to Mod A x2 and improvement in transfers from [place] A x2 to Supervision; Pt has ambulated 40 feet with supervision and AD; Pt still requires assistance for ADLs and endurance for self care tasks. Patient's medical and functional status update has been discussed with the Rehabilitation physician and patient remains appropriate for inpatient rehabilitation. Will admit to inpatient rehab today.  Preadmission Screen Completed By:  Jhonnie Garner, 07/30/2018 3:14 PM ______________________________________________________________________   Discussed status with Dr. Letta Pate on 07/30/18 at 3:14 PM and received telephone approval for admission today.  Admission Coordinator:  Jhonnie Garner, time 3:14PM/Date 07/30/18.

## 2018-07-28 NOTE — Progress Notes (Signed)
PROGRESS NOTE    Mark Baker  SVX:[id number] DOB: 1981/06/24 DOA: 07/23/2018 PCP: Sherren Mocha, MD   Brief Narrative:  37 y.o. 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  Obesity with BMI greater than 60, paroxysmal atrial fibrillation was admitted to the hospital with increasing drainage from the right knee following quadricep tendon repair about 6 weeks ago.  Patient underwent excisional debridement of the right knee which he tolerated well but the following day he developed shortness of breath and was very drowsy.  Patient was offered CPAP yesterday night but he refused.  Medicine team consulted for hypoxic, hypercarbic respiratory failure requiring BiPAP.  Due to worsening renal function, nephrology was consulted.   Assessment & Plan:   Principal Problem:   Acute respiratory failure with hypoxia and hypercapnia (HCC) Active Problems:   Sleep apnea   HTN (hypertension)   Hyperlipidemia   Chronic HFrEF (heart failure with reduced ejection fraction) (HCC)   CKD (chronic kidney disease), stage III (HCC)   Erectile dysfunction   Quadriceps tendon rupture, right, initial encounter   Infection of right knee (HCC)   AKI (acute kidney injury) (HCC)   Supplemental oxygen dependent   PAF (paroxysmal atrial fibrillation) (HCC)   Benign essential HTN   Acute respiratory failure with hypoxia and hypercapnia, slow to improve Acute metabolic encephalopathy, better Acute on chronic combined systolic and congestive heart failure, ejection fraction 20%, class IV - Mentation is better.  He is auto diuresing well.  Echocardiogram 11/26/2017 showed ejection fraction 20-25%.  Repeat echocardiogram 12/16 showed ejection fraction 25% with diffuse hypokinesis. -Closely monitor his mentation.  If necessary we will repeat ABG.  BiPAP PRN. -Continue  Coreg 3.125 twice daily Isosorbide mononitrate 60 mg daily.  Holding nephrotoxic drugs including Aldactone, losartan and Lasix  Acute kidney injury with concerning ATN - Appears patient is auto diuresing now.  Creatinine is trending down to 3.19.  Will closely monitor this.  Hold off on nephrotoxic drugs.  Persistent atrial fibrillation - On amiodarone 200 mg twice daily, Coreg, digoxin, Coumadin.  Pharmacy to dose  Essential hypertension -Continue current medications  Peripheral neuropathy -Gabapentin renally dosed   DVT prophylaxis: Coumadin Code Status: Full code Family Communication: None at bedside Disposition Plan: Maintain inpatient stay until renal function has started improving.  Patient will eventually need CIR.  Procedures:   Quadricep tendon repair on 12/13  Subjective: More awake today. Diuresing well.   Review of Systems Otherwise negative except as per HPI, including: General = no fevers, chills, dizziness, malaise, fatigue HEENT/EYES = negative for pain, redness, loss of vision, double vision, blurred vision, loss of hearing, sore throat, hoarseness, dysphagia Cardiovascular= negative for chest pain, palpitation, murmurs, lower extremity swelling Respiratory/lungs= negative for shortness of breath, cough, hemoptysis, wheezing, mucus production Gastrointestinal= negative for nausea, vomiting,, abdominal pain, melena, hematemesis Genitourinary= negative for Dysuria, Hematuria, Change in Urinary Frequency MSK = Negative for arthralgia, myalgias, Back Pain, Joint swelling  Neurology= Negative for headache, seizures, numbness, tingling  Psychiatry= Negative for anxiety, depression, suicidal and homocidal ideation Allergy/Immunology= Medication/Food allergy as listed  Skin= Negative for Rash, lesions, ulcers, itching   Objective: Vitals:   07/28/18 0752 07/28/18 0900 07/28/18 1100 07/28/18 1146  BP: (!) 129/91   (!) 133/101  Pulse: 79 81  77  Resp: (!) 25   (!) 23   Temp: 97.6 F (36.4 C)     TempSrc: Oral  SpO2: 98% 96%    Weight:      Height:        Intake/Output Summary (Last 24 hours) at 07/28/2018 1201 Last data filed at 07/28/2018 1000 Gross per 24 hour  Intake 720 ml  Output 3825 ml  Net -3105 ml   Filed Weights   07/23/18 1231 07/23/18 1743  Weight: (!) 216.8 kg (!) 238.6 kg    Examination:  Constitutional: NAD, calm, comfortable; currently on [place], morbidly obese Eyes: PERRL, lids and conjunctivae normal ENMT: Mucous membranes are moist. Posterior pharynx clear of any exudate or lesions.Normal dentition.  Neck: normal, supple, no masses, no thyromegaly Respiratory: Diffuse diminished breath sounds Cardiovascular: Regular rate and rhythm, no murmurs / rubs / gallops. 2+ b/l LE pittingedema. 2+ pedal pulses. No carotid bruits.  Abdomen: no tenderness, no masses palpated. No hepatosplenomegaly. Bowel sounds positive.  Musculoskeletal: no clubbing / cyanosis. No joint deformity upper and lower extremities. Good ROM, no contractures. Normal muscle tone.  Skin: no rashes, lesions, ulcers. No induration Neurologic: CN 2-12 grossly intact. Sensation intact, DTR normal. Strength 5/5 in all 4.  Psychiatric: Normal judgment and insight. Alert and oriented x 3. Normal mood.    Data Reviewed:   CBC: Recent Labs  Lab 07/23/18 1329  WBC 6.4  HGB 11.7*  HCT 41.4  MCV 92.2  PLT 391   Basic Metabolic Panel: Recent Labs  Lab 07/23/18 1329 07/26/18 0214 07/27/18 0308 07/28/18 0321  NA 143 136 136 135  K 3.0* 4.0 3.9 3.5  CL 95* 86* 87* 87*  CO2 36* 33* 35* 34*  GLUCOSE 105* 107* 117* 145*  BUN 17 42* 54* 55*  CREATININE 1.56* 3.79* 3.86* 3.19*  CALCIUM 8.6* 8.4* 8.4* 8.3*  MG  --  1.9 2.1 2.3   GFR: Estimated Creatinine Clearance: 64.9 mL/min (A) (by C-G formula based on SCr of 3.19 mg/dL (H)). Liver Function Tests: No results for input(s): AST, ALT, ALKPHOS, BILITOT, PROT, ALBUMIN in the last 168 hours. No  results for input(s): LIPASE, AMYLASE in the last 168 hours. No results for input(s): AMMONIA in the last 168 hours. Coagulation Profile: Recent Labs  Lab 07/23/18 1329 07/25/18 0504 07/26/18 0214 07/27/18 0308 07/28/18 0321  INR 1.69 1.90 2.45 2.45 2.74   Cardiac Enzymes: No results for input(s): CKTOTAL, CKMB, CKMBINDEX, TROPONINI in the last 168 hours. BNP (last 3 results) No results for input(s): PROBNP in the last 8760 hours. HbA1C: No results for input(s): HGBA1C in the last 72 hours. CBG: No results for input(s): GLUCAP in the last 168 hours. Lipid Profile: No results for input(s): CHOL, HDL, LDLCALC, TRIG, CHOLHDL, LDLDIRECT in the last 72 hours. Thyroid Function Tests: No results for input(s): TSH, T4TOTAL, FREET4, T3FREE, THYROIDAB in the last 72 hours. Anemia Panel: No results for input(s): VITAMINB12, FOLATE, FERRITIN, TIBC, IRON, RETICCTPCT in the last 72 hours. Sepsis Labs: No results for input(s): PROCALCITON, LATICACIDVEN in the last 168 hours.  Recent Results (from the past 240 hour(s))  Body fluid culture     Status: None   Collection Time: 07/23/18  3:26 PM  Result Value Ref Range Status   Specimen Description FLUID JOINT OTHER  Final   Special Requests NONE  Final   Gram Stain   Final    ABUNDANT WBC PRESENT, PREDOMINANTLY PMN FEW GRAM POSITIVE COCCI Performed at [facility], [address] [place], [place] [address]    Culture FEW STAPHYLOCOCCUS AUREUS  Final   Report Status 07/27/2018 FINAL  Final   Organism ID, Bacteria STAPHYLOCOCCUS AUREUS  Final      Susceptibility   Staphylococcus aureus - MIC*    CIPROFLOXACIN <=0.5 SENSITIVE Sensitive     ERYTHROMYCIN <=0.25 SENSITIVE Sensitive     GENTAMICIN <=0.5 SENSITIVE Sensitive     OXACILLIN <=0.25 SENSITIVE Sensitive     TETRACYCLINE <=1 SENSITIVE Sensitive     VANCOMYCIN <=0.5 SENSITIVE Sensitive     TRIMETH/SULFA <=10 SENSITIVE Sensitive     CLINDAMYCIN <=0.25 SENSITIVE Sensitive      RIFAMPIN <=0.5 SENSITIVE Sensitive     Inducible Clindamycin NEGATIVE Sensitive     * FEW STAPHYLOCOCCUS AUREUS  Culture, Urine     Status: None   Collection Time: 07/25/18  5:08 PM  Result Value Ref Range Status   Specimen Description URINE, CATHETERIZED  Final   Special Requests NONE  Final   Culture   Final    NO GROWTH Performed at [facility], [address], [place], [place] [address]    Report Status 07/26/2018 FINAL  Final         Radiology Studies: No results found.      Scheduled Meds: . amiodarone  200 mg Oral BID  . atorvastatin  40 mg Oral Daily  . carvedilol  3.125 mg Oral BID  . digoxin  0.25 mg Oral Daily  . docusate sodium  100 mg Oral BID  . hydrALAZINE  50 mg Oral Q8H  . isosorbide mononitrate  60 mg Oral Daily  . magnesium oxide  800 mg Oral Once  . mouth rinse  15 mL Mouth Rinse BID  . potassium chloride  40 mEq Oral Once  . Warfarin - Pharmacist Dosing Inpatient   Does not apply q1800   Continuous Infusions: .  ceFAZolin (ANCEF) IV 2 g (07/28/18 0538)     LOS: 4 days   Time spent= 25 mins    Ankit Joline Maxcy, MD [facility] Pager [phone]   If 7PM-7AM, please contact night-coverage [email or web address] Password [email or web address] 07/28/2018, 12:01 PM

## 2018-07-28 NOTE — Progress Notes (Signed)
Subjective:  BP higher/better- 3 liters of UOP yest- 800 so far today - crt down   Objective Vital signs in last 24 hours: Vitals:   07/28/18 0500 07/28/18 0700 07/28/18 0752 07/28/18 0900  BP:   (!) 129/91   Pulse: 75 77 79 81  Resp: (!) 23 (!) 23 (!) 25   Temp:   97.6 F (36.4 C)   TempSrc:   Oral   SpO2: 95% 95% 98% 96%  Weight:      Height:       Weight change:   Intake/Output Summary (Last 24 hours) at 07/28/2018 1100 Last data filed at 07/28/2018 1000 Gross per 24 hour  Intake 720 ml  Output 3825 ml  Net -3105 ml     Assessment/Plan: 37 year old black male with morbid obesity and cardiomyopathy.  He has suffered acute kidney injury on chronic kidney disease while in the hospital for an orthopedic issue/respiratory failure 1.Renal-acute on chronic kidney disease that happened during hospitalization  (crt 1.56 on 12/12)  for musculoskeletal issue but also respiratory failure requiring rescue BiPAP.  It seemed to be at that time that his urine output dropped and his creatinine bumped.  There may have been a low blood pressure somewhere in there.  His urinalysis after injury was fairly bland only 100 of protein.  I suspect ATN secondary to transient hypotension.  It appears that urine output has increased quite a bit - now seeing  Plateau/  recovery of renal function.  There are no acute indications for dialysis at this time 2. Hypertension/volume  -I agree that he appears volume overloaded.  As of now he appears to be possibly auto diuresing, possibly a post injury diuresis so I will not actively give him a diuretic - remains on coreg and hydralazine  3.  Metabolic alkalosis-likely compensatory for respiratory acidosis 4. Anemia  -not a significant issue at this time 5.  Somnolence-given his decreased GFR and his very high dose of Neurontin I will hold it at this time 6. Hypokalemia- will supplement given trend     Cecille Aver    Labs: Basic Metabolic  Panel: Recent Labs  Lab 07/26/18 0214 07/27/18 0308 07/28/18 0321  NA 136 136 135  K 4.0 3.9 3.5  CL 86* 87* 87*  CO2 33* 35* 34*  GLUCOSE 107* 117* 145*  BUN 42* 54* 55*  CREATININE 3.79* 3.86* 3.19*  CALCIUM 8.4* 8.4* 8.3*   Liver Function Tests: No results for input(s): AST, ALT, ALKPHOS, BILITOT, PROT, ALBUMIN in the last 168 hours. No results for input(s): LIPASE, AMYLASE in the last 168 hours. No results for input(s): AMMONIA in the last 168 hours. CBC: Recent Labs  Lab 07/23/18 1329  WBC 6.4  HGB 11.7*  HCT 41.4  MCV 92.2  PLT 391   Cardiac Enzymes: No results for input(s): CKTOTAL, CKMB, CKMBINDEX, TROPONINI in the last 168 hours. CBG: No results for input(s): GLUCAP in the last 168 hours.  Iron Studies: No results for input(s): IRON, TIBC, TRANSFERRIN, FERRITIN in the last 72 hours. Studies/Results: US Renal  Result Date: 07/26/2018 CLINICAL DATA:  Acute renal insufficiency. EXAM: RENAL / URINARY TRACT ULTRASOUND COMPLETE COMPARISON:  None. FINDINGS: Right Kidney: Renal measurements: 12.3 x 8 x 7.6 cm = volume: 392.1 mL. Limited evaluation. Not well visualized. Left Kidney: Renal measurements: 11.5 x 7.3 x 9.5 cm = volume: 414.9 mL. Not well visualized. Limited evaluation. Bladder: Not visualized. IMPRESSION: 1. Severely limited study due to patient body habitus. No  gross renal abnormalities noted on limited images. Electronically Signed   By: Gerome Sam III M.D   On: 07/26/2018 14:57   Medications: Infusions: .  ceFAZolin (ANCEF) IV 2 g (07/28/18 0538)    Scheduled Medications: . amiodarone  200 mg Oral BID  . atorvastatin  40 mg Oral Daily  . carvedilol  3.125 mg Oral BID  . digoxin  0.25 mg Oral Daily  . docusate sodium  100 mg Oral BID  . hydrALAZINE  50 mg Oral Q8H  . isosorbide mononitrate  60 mg Oral Daily  . magnesium oxide  800 mg Oral Once  . mouth rinse  15 mL Mouth Rinse BID  . Warfarin - Pharmacist Dosing Inpatient   Does not apply  q1800    have reviewed scheduled and prn medications.  Physical Exam: General: on the bedside commode- did not fully examine- spoke to thru curtain Heart: Lungs: Abdomen: Extremities:    07/28/2018,11:00 AM  LOS: 4 days

## 2018-07-28 NOTE — Progress Notes (Signed)
Physical Therapy Treatment Patient Details Name: Mark Baker MRN: [id number] DOB: [birth year] Today's Date: 07/28/2018    History of Present Illness 37 y.o. 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  Obesity with BMI greater than 60, paroxysmal atrial fibrillation was admitted to the hospital with increasing drainage from the right knee following quadricep tendon repair about 6 weeks ago.  Underwent I&D 12/12 then developed SOB and lethargy. Required bipap for hypercarbic respiratory failure.    PT Comments    Patient seen for mobility progression. Able to come to EOB with heavy use of bed rails and increased time and effort with Min A +2. Sit to stand and stand pivot to [facility] today with improved steadiness and overall ability as compared to yesterday. Patient requesting to sit on [facility] due to lack of BM in many days with nursing staff notified of transfer status and patient with call bell. Will continue to recommend CIR.     Follow Up Recommendations  Supervision/Assistance - 24 hour;CIR     Equipment Recommendations  Other (comment)(defer)    Recommendations for Other Services       Precautions / Restrictions Precautions Precautions: Fall Required Braces or Orthoses: Other Brace Other Brace: ace wrap only R LE Restrictions Weight Bearing Restrictions: No RLE Weight Bearing: Weight bearing as tolerated    Mobility  Bed Mobility Overal bed mobility: Needs Assistance Bed Mobility: Supine to Sit     Supine to sit: Mod assist;+2 for physical assistance     General bed mobility comments: increased time and effort; heavy use of bed rails for trunkl control and upright righting  Transfers Overall transfer level: Needs assistance Equipment used: None Transfers: Sit to/from [employer] Transfers Sit to Stand: Min  assist;+2 physical assistance;From elevated surface Stand pivot transfers: Min assist;+2 physical assistance       General transfer comment: Min A +2 to power up at bedside - patient requesting use of back of chair for added stability; Min A +2 for stand pivot to [facility] for safety  Ambulation/Gait                 Stairs             Wheelchair Mobility    Modified Rankin (Stroke Patients Only)       Balance Overall balance assessment: Needs assistance Sitting-balance support: Bilateral upper extremity supported;Feet supported Sitting balance-Leahy Scale: Fair     Standing balance support: During functional activity;Bilateral upper extremity supported Standing balance-Leahy Scale: Poor                              Cognition Arousal/Alertness: Awake/alert Behavior During Therapy: WFL for tasks assessed/performed Overall Cognitive Status: Within Functional Limits for tasks assessed                                        Exercises      General Comments General comments (skin integrity, edema, etc.): patient reporting improvements in mobilit as compared to yesterday; pateint left on [facility] wtih nurse/nurse tech aware of transfer status - patient requesting to spend time on toilet as he has not had a BM in many days; call bell with patient       Pertinent Vitals/Pain Pain Assessment: No/denies pain  Home Living                      Prior Function            PT Goals (current goals can now be found in the care plan section) Acute Rehab PT Goals Patient Stated Goal: to return to independent PT Goal Formulation: With patient Time For Goal Achievement: 08/10/18 Potential to Achieve Goals: Good Progress towards PT goals: Progressing toward goals    Frequency    Min 3X/week      PT Plan Current plan remains appropriate    Co-evaluation PT/OT/SLP Co-Evaluation/Treatment: Yes Reason for Co-Treatment: For  patient/therapist safety;To address functional/ADL transfers PT goals addressed during session: Mobility/safety with mobility;Balance;Proper use of DME        AM-PAC PT "6 Clicks" Mobility   Outcome Measure  Help needed turning from your back to your side while in a flat bed without using bedrails?: A Lot Help needed moving from lying on your back to sitting on the side of a flat bed without using bedrails?: A Lot Help needed moving to and from a bed to a chair (including a wheelchair)?: A Lot Help needed standing up from a chair using your arms (e.g., wheelchair or bedside chair)?: A Lot Help needed to walk in hospital room?: A Lot Help needed climbing 3-5 steps with a railing? : Total 6 Click Score: 11    End of Session Equipment Utilized During Treatment: Gait belt;Oxygen Activity Tolerance: Patient tolerated treatment well Patient left: in chair;with call bell/phone within reach Nurse Communication: Mobility status PT Visit Diagnosis: Muscle weakness (generalized) (M62.81);Difficulty in walking, not elsewhere classified (R26.2);Other abnormalities of gait and mobility (R26.89)     Time: 3846-6599 PT Time Calculation (min) (ACUTE ONLY): 49 min  Charges:  $Therapeutic Activity: 23-37 mins                     Kipp Laurence, PT, DPT Supplemental Physical Therapist 07/28/18 11:17 AM Pager: [phone] Office: [phone]

## 2018-07-29 LAB — CBC
HCT: 37.3 % — ABNORMAL LOW (ref 39.0–52.0)
Hemoglobin: 10.6 g/dL — ABNORMAL LOW (ref 13.0–17.0)
MCH: 26 pg (ref 26.0–34.0)
MCHC: 28.4 g/dL — AB (ref 30.0–36.0)
MCV: 91.4 fL (ref 80.0–100.0)
Platelets: 313 10*3/uL (ref 150–400)
RBC: 4.08 MIL/uL — ABNORMAL LOW (ref 4.22–5.81)
RDW: 14.2 % (ref 11.5–15.5)
WBC: 8.5 10*3/uL (ref 4.0–10.5)
nRBC: 0 % (ref 0.0–0.2)

## 2018-07-29 LAB — PROTIME-INR
INR: 2.65
PROTHROMBIN TIME: 27.9 s — AB (ref 11.4–15.2)

## 2018-07-29 LAB — BASIC METABOLIC PANEL
Anion gap: 12 (ref 5–15)
BUN: 50 mg/dL — ABNORMAL HIGH (ref 6–20)
CO2: 35 mmol/L — ABNORMAL HIGH (ref 22–32)
CREATININE: 2.66 mg/dL — AB (ref 0.61–1.24)
Calcium: 8.2 mg/dL — ABNORMAL LOW (ref 8.9–10.3)
Chloride: 89 mmol/L — ABNORMAL LOW (ref 98–111)
GFR calc non Af Amer: 29 mL/min — ABNORMAL LOW (ref >60)
GFR, EST AFRICAN AMERICAN: 34 mL/min — AB (ref >60)
Glucose, Bld: 100 mg/dL — ABNORMAL HIGH (ref 70–99)
Potassium: 3.8 mmol/L (ref 3.5–5.1)
Sodium: 136 mmol/L (ref 135–145)

## 2018-07-29 LAB — MAGNESIUM: Magnesium: 2.4 mg/dL (ref 1.7–2.4)

## 2018-07-29 MED ORDER — WARFARIN SODIUM 7.5 MG PO TABS
7.5000 mg | ORAL_TABLET | Freq: Once | ORAL | Status: AC
  Administered 2018-07-29: 7.5 mg via ORAL
  Filled 2018-07-29: qty 1

## 2018-07-29 MED ORDER — CARVEDILOL 3.125 MG PO TABS
3.1250 mg | ORAL_TABLET | Freq: Once | ORAL | Status: AC
  Administered 2018-07-29: 3.125 mg via ORAL
  Filled 2018-07-29: qty 1

## 2018-07-29 MED ORDER — POTASSIUM CHLORIDE CRYS ER 20 MEQ PO TBCR
40.0000 meq | EXTENDED_RELEASE_TABLET | Freq: Once | ORAL | Status: AC
  Administered 2018-07-29: 40 meq via ORAL
  Filled 2018-07-29: qty 2

## 2018-07-29 MED ORDER — TORSEMIDE 20 MG PO TABS
20.0000 mg | ORAL_TABLET | Freq: Every day | ORAL | Status: DC
  Administered 2018-07-30: 20 mg via ORAL
  Filled 2018-07-29: qty 1

## 2018-07-29 MED ORDER — CARVEDILOL 6.25 MG PO TABS
6.2500 mg | ORAL_TABLET | Freq: Two times a day (BID) | ORAL | Status: DC
  Administered 2018-07-29 – 2018-07-30 (×2): 6.25 mg via ORAL
  Filled 2018-07-29 (×2): qty 1

## 2018-07-29 NOTE — Progress Notes (Signed)
Foley catheter removed at 17:40, per MD orders, patient is due to void.

## 2018-07-29 NOTE — Progress Notes (Signed)
Verified with Dr. Kathrene Bongo patient to receive AM coreg 3.125 mg. New order entered to administer medication

## 2018-07-29 NOTE — Progress Notes (Signed)
Subjective:  BP now maybe too high-  Nearly 3 liters of UOP again yest-- crt down again - he feels better   Objective Vital signs in last 24 hours: Vitals:   07/28/18 2307 07/28/18 2338 07/29/18 0436 07/29/18 0800  BP: 136/71 136/71 139/74 (!) 143/82  Pulse: 81 77 73 78  Resp: 19 (!) 21 11 (!) 22  Temp: 97.7 F (36.5 C)  (!) 97.5 F (36.4 C) 98.1 F (36.7 C)  TempSrc: Oral  Axillary Oral  SpO2: 96% 96% 95% 96%  Weight:      Height:       Weight change:   Intake/Output Summary (Last 24 hours) at 07/29/2018 0956 Last data filed at 07/29/2018 0900 Gross per 24 hour  Intake 2024.5 ml  Output 2876 ml  Net -851.5 ml     Assessment/Plan: 37 year old black male with morbid obesity and cardiomyopathy.  He has suffered acute kidney injury on chronic kidney disease while in the hospital for an orthopedic issue/respiratory failure 1.Renal-acute on chronic kidney disease that happened during hospitalization  (crt 1.56 on 12/12)  for musculoskeletal issue but also respiratory failure requiring rescue BiPAP.  It seemed to be at that time that his urine output dropped and his creatinine bumped.  There may have been a low blood pressure somewhere in there.  His urinalysis after injury was fairly bland only 100 of protein.  I suspect ATN secondary to transient hypotension.  It appears that urine output has increased quite a bit - now seeing recovery of renal function.   2. Hypertension/volume  -I agree that he appears volume overloaded.  As of now he appears to be possibly auto diuresing, possibly a post injury diuresis so I will not actively give him a diuretic - remains on coreg and hydralazine- will increase coreg today.  Probably needs to resume torsemide tomorrow- will start at 20 daily.  Would wait on resuming ARB until renal function at baseline  3.  Metabolic alkalosis-likely compensatory for respiratory acidosis 4. Anemia  -not a significant issue at this time 5.  Somnolence-given his  decreased GFR and his very high dose of Neurontin I had stopped  6. Hypokalemia- supplement on 12/17 given trend - better today but will supplement as am resuming his torsemide tomorrow  Since renal function trending better, renal will follow labs at a distance due to high consult volume and not see daily.  Anticipate continued recovery, call with specific questions     Cecille Aver    Labs: Basic Metabolic Panel: Recent Labs  Lab 07/27/18 0308 07/28/18 0321 07/29/18 0231  NA 136 135 136  K 3.9 3.5 3.8  CL 87* 87* 89*  CO2 35* 34* 35*  GLUCOSE 117* 145* 100*  BUN 54* 55* 50*  CREATININE 3.86* 3.19* 2.66*  CALCIUM 8.4* 8.3* 8.2*   Liver Function Tests: No results for input(s): AST, ALT, ALKPHOS, BILITOT, PROT, ALBUMIN in the last 168 hours. No results for input(s): LIPASE, AMYLASE in the last 168 hours. No results for input(s): AMMONIA in the last 168 hours. CBC: Recent Labs  Lab 07/23/18 1329 07/29/18 0231  WBC 6.4 8.5  HGB 11.7* 10.6*  HCT 41.4 37.3*  MCV 92.2 91.4  PLT 391 313   Cardiac Enzymes: No results for input(s): CKTOTAL, CKMB, CKMBINDEX, TROPONINI in the last 168 hours. CBG: No results for input(s): GLUCAP in the last 168 hours.  Iron Studies: No results for input(s): IRON, TIBC, TRANSFERRIN, FERRITIN in the last 72 hours. Studies/Results: No  results found. Medications: Infusions: . sodium chloride Stopped (07/29/18 1610)  .  ceFAZolin (ANCEF) IV 2 g (07/29/18 0511)    Scheduled Medications: . amiodarone  200 mg Oral BID  . atorvastatin  40 mg Oral Daily  . carvedilol  3.125 mg Oral BID  . digoxin  0.25 mg Oral Daily  . docusate sodium  100 mg Oral BID  . hydrALAZINE  50 mg Oral Q8H  . isosorbide mononitrate  60 mg Oral Daily  . magnesium oxide  800 mg Oral Once  . mouth rinse  15 mL Mouth Rinse BID  . Warfarin - Pharmacist Dosing Inpatient   Does not apply q1800    have reviewed scheduled and prn medications.  Physical  Exam: General: on the bedside commode-  Heart: RRR Lungs: mostly clear Abdomen: soft, non tender Extremities: pitting edema      07/29/2018,9:56 AM  LOS: 5 days

## 2018-07-29 NOTE — Progress Notes (Signed)
Patient had amber/pink colored urine in foley drainage bag, notified Dr. Nelson Chimes. Received orders to removed foley catheter and monitor urine output.

## 2018-07-29 NOTE — Plan of Care (Signed)
  Problem: Education: Goal: Knowledge of General Education information will improve Description Including pain rating scale, medication(s)/side effects and non-pharmacologic comfort measures Outcome: Progressing   Problem: Health Behavior/Discharge Planning: Goal: Ability to manage health-related needs will improve Outcome: Progressing   

## 2018-07-29 NOTE — Progress Notes (Signed)
   Subjective: 6 Days Post-Op Procedure(s) (LRB): Right knee  incisional irrigation and debridement (Right)  Pt complaining of no energy, but denies SOB, or HA. Appreciate internal medicine involvement Right knee stable at this point  Patient reports pain as mild.  Objective:   VITALS:   Vitals:   07/29/18 1200 07/29/18 1348  BP: (!) 156/87 (!) 148/89  Pulse: 77   Resp: 18 (!) 21  Temp: 98.1 F (36.7 C)   SpO2: 93%     Right knee dressing changed, serous drainage noted distally, but incision is intact No rashes distally Wiggles toes No pain with PROM   LABS Recent Labs    07/29/18 0231  HGB 10.6*  HCT 37.3*  WBC 8.5  PLT 313    Recent Labs    07/27/18 0308 07/28/18 0321 07/29/18 0231  NA 136 135 136  K 3.9 3.5 3.8  BUN 54* 55* 50*  CREATININE 3.86* 3.19* 2.66*  GLUCOSE 117* 145* 100*     Assessment/Plan: 6 Days Post-Op Procedure(s) (LRB): Right knee  incisional irrigation and debridement (Right) From knee standpoint, ok for daily dry dressing changes, with ABD and ACE wrap -continue cefazolin for MSSA right knee infection x 4 weeks - ok for WBAT to RLE with assistive device  - appreciate nephro, hospitalists, and PCCM for there input and management of critical medical problems - CIR bed has been recommended, we will await transfer when able     Yolonda Kida

## 2018-07-29 NOTE — Progress Notes (Addendum)
Physical Therapy Treatment Patient Details Name: Mark Baker MRN: [id number] DOB: 05-31-1981 Today's Date: 07/29/2018    History of Present Illness 37 y.o. 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  Obesity with BMI greater than 60, paroxysmal atrial fibrillation was admitted to the hospital with increasing drainage from the right knee following quadricep tendon repair about 6 weeks ago.  Underwent I&D 12/12 then developed SOB and lethargy. Required bipap for hypercarbic respiratory failure.    PT Comments    Pt was able to walk on crutches with PT and noted O2 sats on 2L O2 via nasal cannula were 95% with gait and 97% at rest.  Pt is very motivated to walk, noting that his knee is looser with gait and more stiff and uncomfortable at rest.  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  Follow acutely for endurance and balance training, and will expect transition to CIR with his progression of recovery of R knee.   Follow Up Recommendations  Supervision/Assistance - 24 hour;CIR     Equipment Recommendations  Other (comment)    Recommendations for Other Services       Precautions / Restrictions Precautions Precautions: Fall Required Braces or Orthoses: Other Brace Other Brace: ace wrap only R LE Restrictions Weight Bearing Restrictions: No RLE Weight Bearing: Weight bearing as tolerated    Mobility  Bed Mobility               General bed mobility comments: up in chair when PT arrived  Transfers Overall transfer level: Needs assistance Equipment used: None Transfers: Sit to/from Stand Sit to Stand: Supervision         General transfer comment: pt was able to stand with his UE's from recliner  Ambulation/Gait Ambulation/Gait assistance: Supervision Gait Distance (Feet): 40  Feet Assistive device: Crutches;1 person hand held assist Gait Pattern/deviations: Step-through pattern;Step-to pattern;Wide base of support;Trunk flexed Gait velocity: reduced Gait velocity interpretation: <1.31 ft/sec, indicative of household ambulator General Gait Details: gave pt instructions not to lean axillae on the crutches   Stairs             Wheelchair Mobility    Modified Rankin (Stroke Patients Only)       Balance Overall balance assessment: Needs assistance Sitting-balance support: Bilateral upper extremity supported;Feet supported Sitting balance-Leahy Scale: Good     Standing balance support: Bilateral upper extremity supported;During functional activity Standing balance-Leahy Scale: Fair                              Cognition Arousal/Alertness: Awake/alert Behavior During Therapy: WFL for tasks assessed/performed Overall Cognitive Status: Within Functional Limits for tasks assessed                                        Exercises      General Comments General comments (skin integrity, edema, etc.): pt has a clean ace wrap on RLE and is maintaining use of LLE mainly to stand up      Pertinent Vitals/Pain Pain Assessment: No/denies pain Pain Descriptors / Indicators: (stiffness upon initial standing but not hurting)    Home Living  Prior Function            PT Goals (current goals can now be found in the care plan section) Acute Rehab PT Goals Patient Stated Goal: to return to independent Progress towards PT goals: Progressing toward goals    Frequency    Min 3X/week      PT Plan Current plan remains appropriate    Co-evaluation              AM-PAC PT "6 Clicks" Mobility   Outcome Measure  Help needed turning from your back to your side while in a flat bed without using bedrails?: A Little Help needed moving from lying on your back to sitting on the side of a  flat bed without using bedrails?: A Little Help needed moving to and from a bed to a chair (including a wheelchair)?: A Little Help needed standing up from a chair using your arms (e.g., wheelchair or bedside chair)?: A Little Help needed to walk in hospital room?: A Little Help needed climbing 3-5 steps with a railing? : A Lot 6 Click Score: 17    End of Session Equipment Utilized During Treatment: Gait belt;Oxygen Activity Tolerance: Patient tolerated treatment well Patient left: in chair;with call bell/phone within reach;with nursing/sitter in room Nurse Communication: Mobility status PT Visit Diagnosis: Muscle weakness (generalized) (M62.81);Difficulty in walking, not elsewhere classified (R26.2);Other abnormalities of gait and mobility (R26.89)     Time: 1610-9604 PT Time Calculation (min) (ACUTE ONLY): 33 min  Charges:  $Gait Training: 8-22 mins $Therapeutic Activity: 8-22 mins                   Ivar Drape 07/29/2018, 1:02 PM   Samul Dada, PT MS Acute Rehab Dept. Number: [facility] [id number] and [facility] [phone]

## 2018-07-29 NOTE — Progress Notes (Signed)
PROGRESS NOTE    Mark Baker  OMV:[id number] DOB: [birth year] DOA: 07/23/2018 PCP: Sherren Mocha, MD   Brief Narrative:  37 y.o. 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  Obesity with BMI greater than 60, paroxysmal atrial fibrillation was admitted to the hospital with increasing drainage from the right knee following quadricep tendon repair about 6 weeks ago.  Patient underwent excisional debridement of the right knee which he tolerated well but the following day he developed shortness of breath and was very drowsy.  Patient was offered CPAP yesterday night but he refused.  Medicine team consulted for hypoxic, hypercarbic respiratory failure requiring BiPAP.  Due to worsening renal function, nephrology was consulted.  Now patient is auto diuresing and renal function is improving   Assessment & Plan:   Principal Problem:   Acute respiratory failure with hypoxia and hypercapnia (HCC) Active Problems:   Sleep apnea   HTN (hypertension)   Hyperlipidemia   Chronic HFrEF (heart failure with reduced ejection fraction) (HCC)   CKD (chronic kidney disease), stage III (HCC)   Erectile dysfunction   Quadriceps tendon rupture, right, initial encounter   Infection of right knee (HCC)   AKI (acute kidney injury) (HCC)   Supplemental oxygen dependent   PAF (paroxysmal atrial fibrillation) (HCC)   Benign essential HTN   Acute respiratory failure with hypoxia and hypercapnia, improved Acute metabolic encephalopathy, better Acute on chronic combined systolic and congestive heart failure, ejection fraction 20%, class IV - Mentation is better.  He is auto diuresing well.  Echocardiogram 11/26/2017 showed ejection fraction 20-25%.  Repeat echocardiogram 12/16 showed ejection fraction 25% with diffuse hypokinesis. -Continue BiPAP as needed -Continue  Coreg 3.125 milligrams twice daily, isosorbide mononitrate 60 mg daily.  Holding off on nephrotoxic drugs-Aldactone, losartan and Lasix  Acute kidney injury with concerning ATN - Slowly improving.  Today it is trended down to 2.66.  Persistent atrial fibrillation - On amiodarone 200 mg twice daily, Coreg, digoxin, Coumadin.  Pharmacy to dose  Essential hypertension -Continue current medications  Peripheral neuropathy -Gabapentin renally dosed   DVT prophylaxis: Coumadin Code Status: Full code Family Communication: None at bedside Disposition Plan: Should be able to discharge him to CIR over next 24 hours  Procedures:   Quadricep tendon repair on 12/13  Subjective: Greatly improved, sitting on commode without any oxygen.  He was able to use BiPAP overnight until 3 in the morning.  Took it off because he had slight headache.  Review of Systems Otherwise negative except as per HPI, including: General = no fevers, chills, dizziness, malaise, fatigue HEENT/EYES = negative for pain, redness, loss of vision, double vision, blurred vision, loss of hearing, sore throat, hoarseness, dysphagia Cardiovascular= negative for chest pain, palpitation, murmurs, lower extremity swelling Respiratory/lungs= negative for shortness of breath, cough, hemoptysis, wheezing, mucus production Gastrointestinal= negative for nausea, vomiting,, abdominal pain, melena, hematemesis Genitourinary= negative for Dysuria, Hematuria, Change in Urinary Frequency MSK = Negative for arthralgia, myalgias, Back Pain, Joint swelling  Neurology= Negative for headache, seizures, numbness, tingling  Psychiatry= Negative for anxiety, depression, suicidal and homocidal ideation Allergy/Immunology= Medication/Food allergy as listed  Skin= Negative for Rash, lesions, ulcers, itching    Objective: Vitals:   07/28/18 2338 07/29/18 0436 07/29/18 0800 07/29/18 1009  BP: 136/71 139/74 (!) 143/82 (!) 145/82  Pulse: 77 73  78   Resp: (!) 21 11 (!) 22 16  Temp:  Marland Kitchen)  97.5 F (36.4 C) 98.1 F (36.7 C)   TempSrc:  Axillary Oral   SpO2: 96% 95% 96%   Weight:      Height:        Intake/Output Summary (Last 24 hours) at 07/29/2018 1048 Last data filed at 07/29/2018 1015 Gross per 24 hour  Intake 2024.5 ml  Output 2876 ml  Net -851.5 ml   Filed Weights   07/23/18 1231 07/23/18 1743  Weight: (!) 216.8 kg (!) 238.6 kg    Examination:  Constitutional: NAD, calm, comfortable, morbidly obese Eyes: PERRL, lids and conjunctivae normal ENMT: Mucous membranes are moist. Posterior pharynx clear of any exudate or lesions.Normal dentition.  Neck: normal, supple, no masses, no thyromegaly Respiratory: Diffuse diminished breath sounds likely dose body habitus Cardiovascular: Regular rate and rhythm, no murmurs / rubs / gallops.  2+ bilateral lower extremity edema. 2+ pedal pulses. No carotid bruits.  Abdomen: no tenderness, no masses palpated. No hepatosplenomegaly. Bowel sounds positive.  Musculoskeletal: no clubbing / cyanosis. No joint deformity upper and lower extremities. Good ROM, no contractures. Normal muscle tone.  Skin: Knee dressing is in place without any obvious evidence of infection or bleeding Neurologic: CN 2-12 grossly intact. Sensation intact, DTR normal. Strength 4/5 in all 4.  Psychiatric: Normal judgment and insight. Alert and oriented x 3. Normal mood.    Data Reviewed:   CBC: Recent Labs  Lab 07/23/18 1329 07/29/18 0231  WBC 6.4 8.5  HGB 11.7* 10.6*  HCT 41.4 37.3*  MCV 92.2 91.4  PLT 391 313   Basic Metabolic Panel: Recent Labs  Lab 07/23/18 1329 07/26/18 0214 07/27/18 0308 07/28/18 0321 07/29/18 0231  NA 143 136 136 135 136  K 3.0* 4.0 3.9 3.5 3.8  CL 95* 86* 87* 87* 89*  CO2 36* 33* 35* 34* 35*  GLUCOSE 105* 107* 117* 145* 100*  BUN 17 42* 54* 55* 50*  CREATININE 1.56* 3.79* 3.86* 3.19* 2.66*  CALCIUM 8.6* 8.4* 8.4* 8.3* 8.2*  MG  --  1.9 2.1 2.3 2.4    GFR: Estimated Creatinine Clearance: 77.8 mL/min (A) (by C-G formula based on SCr of 2.66 mg/dL (H)). Liver Function Tests: No results for input(s): AST, ALT, ALKPHOS, BILITOT, PROT, ALBUMIN in the last 168 hours. No results for input(s): LIPASE, AMYLASE in the last 168 hours. No results for input(s): AMMONIA in the last 168 hours. Coagulation Profile: Recent Labs  Lab 07/25/18 0504 07/26/18 0214 07/27/18 0308 07/28/18 0321 07/29/18 0231  INR 1.90 2.45 2.45 2.74 2.65   Cardiac Enzymes: No results for input(s): CKTOTAL, CKMB, CKMBINDEX, TROPONINI in the last 168 hours. BNP (last 3 results) No results for input(s): PROBNP in the last 8760 hours. HbA1C: No results for input(s): HGBA1C in the last 72 hours. CBG: No results for input(s): GLUCAP in the last 168 hours. Lipid Profile: No results for input(s): CHOL, HDL, LDLCALC, TRIG, CHOLHDL, LDLDIRECT in the last 72 hours. Thyroid Function Tests: No results for input(s): TSH, T4TOTAL, FREET4, T3FREE, THYROIDAB in the last 72 hours. Anemia Panel: No results for input(s): VITAMINB12, FOLATE, FERRITIN, TIBC, IRON, RETICCTPCT in the last 72 hours. Sepsis Labs: No results for input(s): PROCALCITON, LATICACIDVEN in the last 168 hours.  Recent Results (from the past 240 hour(s))  Body fluid culture     Status: None   Collection Time: 07/23/18  3:26 PM  Result Value Ref Range Status   Specimen Description FLUID JOINT OTHER  Final   Special Requests NONE  Final   Gram Stain  Final    ABUNDANT WBC PRESENT, PREDOMINANTLY PMN FEW GRAM POSITIVE COCCI Performed at [facility], [address] [place], [place] [address]    Culture FEW STAPHYLOCOCCUS AUREUS  Final   Report Status 07/27/2018 FINAL  Final   Organism ID, Bacteria STAPHYLOCOCCUS AUREUS  Final      Susceptibility   Staphylococcus aureus - MIC*    CIPROFLOXACIN <=0.5 SENSITIVE Sensitive     ERYTHROMYCIN <=0.25 SENSITIVE Sensitive     GENTAMICIN <=0.5 SENSITIVE  Sensitive     OXACILLIN <=0.25 SENSITIVE Sensitive     TETRACYCLINE <=1 SENSITIVE Sensitive     VANCOMYCIN <=0.5 SENSITIVE Sensitive     TRIMETH/SULFA <=10 SENSITIVE Sensitive     CLINDAMYCIN <=0.25 SENSITIVE Sensitive     RIFAMPIN <=0.5 SENSITIVE Sensitive     Inducible Clindamycin NEGATIVE Sensitive     * FEW STAPHYLOCOCCUS AUREUS  Culture, Urine     Status: None   Collection Time: 07/25/18  5:08 PM  Result Value Ref Range Status   Specimen Description URINE, CATHETERIZED  Final   Special Requests NONE  Final   Culture   Final    NO GROWTH Performed at [facility], [address], [place], [place] [address]    Report Status 07/26/2018 FINAL  Final         Radiology Studies: No results found.      Scheduled Meds: . amiodarone  200 mg Oral BID  . atorvastatin  40 mg Oral Daily  . carvedilol  3.125 mg Oral Once  . carvedilol  6.25 mg Oral BID  . digoxin  0.25 mg Oral Daily  . docusate sodium  100 mg Oral BID  . hydrALAZINE  50 mg Oral Q8H  . isosorbide mononitrate  60 mg Oral Daily  . magnesium oxide  800 mg Oral Once  . mouth rinse  15 mL Mouth Rinse BID  . [START ON 07/30/2018] torsemide  20 mg Oral Daily  . Warfarin - Pharmacist Dosing Inpatient   Does not apply q1800   Continuous Infusions: . sodium chloride Stopped (07/29/18 0981)  .  ceFAZolin (ANCEF) IV 2 g (07/29/18 0511)     LOS: 5 days   Time spent= 27 mins    Ankit Joline Maxcy, MD [facility] Pager [phone]   If 7PM-7AM, please contact night-coverage [email or web address] Password [email or web address] 07/29/2018, 10:48 AM

## 2018-07-29 NOTE — Plan of Care (Signed)
  Problem: Education: Goal: Knowledge of General Education information will improve Description: Including pain rating scale, medication(s)/side effects and non-pharmacologic comfort measures Outcome: Progressing   Problem: Nutrition: Goal: Adequate nutrition will be maintained Outcome: Progressing   Problem: Coping: Goal: Level of anxiety will decrease Outcome: Progressing   Problem: Pain Managment: Goal: General experience of comfort will improve Outcome: Progressing   Problem: Activity: Goal: Risk for activity intolerance will decrease Outcome: Not Progressing   

## 2018-07-29 NOTE — Progress Notes (Signed)
ANTICOAGULATION CONSULT NOTE - Follow-Up Consult  Pharmacy Consult for warfarin Indication: history of  atrial fibrillation  Allergies  Allergen Reactions  . Aspirin Hives, Itching and Rash  . Entresto [Sacubitril-Valsartan] Cough    Was on Entresto 07/2016-09/2016    Patient Measurements: Height: 6\' 3"  (190.5 cm) Weight: (unable to get pt's weight/ bed transfer ) IBW/kg (Calculated) : 84.5   Vital Signs: Temp: 98.1 F (36.7 C) (12/18 1200) Temp Source: Oral (12/18 1200) BP: 148/89 (12/18 1348) Pulse Rate: 77 (12/18 1200)  Labs: Recent Labs    07/27/18 0308 07/28/18 0321 07/29/18 0231  HGB  --   --  10.6*  HCT  --   --  37.3*  PLT  --   --  313  LABPROT 26.3* 28.6* 27.9*  INR 2.45 2.74 2.65  CREATININE 3.86* 3.19* 2.66*    Estimated Creatinine Clearance: 77.8 mL/min (A) (by C-G formula based on SCr of 2.66 mg/dL (H)).   Medical History: Past Medical History:  Diagnosis Date  . Childhood asthma    no exacerbation since age 30   . Chronic combined systolic and diastolic CHF (congestive heart failure) (HCC)    a) ECHO (11/2012): EF 40-45%, RV fx difficult to see, nl size b) ECHO (05/2014): EF 20-25%, grade 2 DD, RV mildly dilated and sys fx mod reduced  . Hypertension   . Migraine    "down to maybe 2/year now; used to have them q other week" (11/26/2017)  . NICM (nonischemic cardiomyopathy) (HCC)    a) (11/2012): normal coronaries  . OSA on CPAP    wears CPAP  . Postoperative wound infection    right knee  . Wears glasses      Assessment: 37 yoM with morbidy obesity and h/o PAF admitted for R knee I&D, 6 weeks out from quadriceps tendon repair (06/10/18). Pt on warfarin PTA for h/o PAF which was initially resumed by surgical MD with plans for discharge 12/14 on home regimen. Pt decompensated with respiratory insufficiency requiring BiPAP and will remain admitted with hospitalist service, pharmacy consulted to dose warfarin.   PTA Dose = 7.5mg  Mon/Wed/Fri,  3.75mg  all other days (LD pta taken 07/20/18)  INR 1.69 on admit 07/23/18  Today the INR is 2.65 remains therapeutic.  Continues on PTA Amiodarone 200 mg bid on PTA thus not a new drug interaction.   Hgb low stable, pltc wnl.   No bleeding noted   Goal of Therapy:  INR 2-3 Monitor platelets by anticoagulation protocol: Yes   Plan:  -Warfarin 7.5mg  PO x1 tonight (usual PTA dose)  -Daily protime/INR   Noah Delaine, RPh Clinical Pharmacist Pager: [phone] Please check AMION for all [facility] numbers 07/29/2018

## 2018-07-29 NOTE — Progress Notes (Signed)
Inpatient Rehabilitation-Admissions Coordinator   [facility] has received insurance authorization for CIR. AC will follow for medical readiness.   Please call if questions.   Nanine Means, OTR/L  Rehab Admissions Coordinator  [phone] 07/29/2018 3:37 PM

## 2018-07-30 ENCOUNTER — Inpatient Hospital Stay (HOSPITAL_COMMUNITY)
Admission: RE | Admit: 2018-07-30 | Discharge: 2018-08-04 | DRG: 945 | Disposition: A | Discharge status: Home / Self Care | Payer: Managed Care, Other (non HMO) | Source: Intra-hospital | Attending: Physical Medicine & Rehabilitation | Admitting: Physical Medicine & Rehabilitation

## 2018-07-30 ENCOUNTER — Encounter (HOSPITAL_COMMUNITY): Payer: Self-pay | Admitting: *Deleted

## 2018-07-30 DIAGNOSIS — R5381 Other malaise: Principal | ICD-10-CM

## 2018-07-30 DIAGNOSIS — I428 Other cardiomyopathies: Secondary | ICD-10-CM | POA: Diagnosis present

## 2018-07-30 DIAGNOSIS — J398 Other specified diseases of upper respiratory tract: Secondary | ICD-10-CM

## 2018-07-30 DIAGNOSIS — G4733 Obstructive sleep apnea (adult) (pediatric): Secondary | ICD-10-CM | POA: Diagnosis present

## 2018-07-30 DIAGNOSIS — Z7901 Long term (current) use of anticoagulants: Secondary | ICD-10-CM | POA: Diagnosis not present

## 2018-07-30 DIAGNOSIS — J988 Other specified respiratory disorders: Secondary | ICD-10-CM

## 2018-07-30 DIAGNOSIS — Z8249 Family history of ischemic heart disease and other diseases of the circulatory system: Secondary | ICD-10-CM

## 2018-07-30 DIAGNOSIS — I130 Hypertensive heart and chronic kidney disease with heart failure and stage 1 through stage 4 chronic kidney disease, or unspecified chronic kidney disease: Secondary | ICD-10-CM | POA: Diagnosis present

## 2018-07-30 DIAGNOSIS — Z6841 Body Mass Index (BMI) 40.0 and over, adult: Secondary | ICD-10-CM

## 2018-07-30 DIAGNOSIS — D62 Acute posthemorrhagic anemia: Secondary | ICD-10-CM | POA: Diagnosis present

## 2018-07-30 DIAGNOSIS — I509 Heart failure, unspecified: Secondary | ICD-10-CM

## 2018-07-30 DIAGNOSIS — N179 Acute kidney failure, unspecified: Secondary | ICD-10-CM | POA: Diagnosis present

## 2018-07-30 DIAGNOSIS — Z9119 Patient's noncompliance with other medical treatment and regimen: Secondary | ICD-10-CM

## 2018-07-30 DIAGNOSIS — I5042 Chronic combined systolic (congestive) and diastolic (congestive) heart failure: Secondary | ICD-10-CM | POA: Diagnosis present

## 2018-07-30 DIAGNOSIS — N183 Chronic kidney disease, stage 3 unspecified: Secondary | ICD-10-CM | POA: Diagnosis present

## 2018-07-30 DIAGNOSIS — I5043 Acute on chronic combined systolic (congestive) and diastolic (congestive) heart failure: Secondary | ICD-10-CM | POA: Diagnosis not present

## 2018-07-30 DIAGNOSIS — M009 Pyogenic arthritis, unspecified: Secondary | ICD-10-CM

## 2018-07-30 DIAGNOSIS — N1832 Chronic kidney disease, stage 3b: Secondary | ICD-10-CM | POA: Diagnosis present

## 2018-07-30 DIAGNOSIS — Z79899 Other long term (current) drug therapy: Secondary | ICD-10-CM

## 2018-07-30 DIAGNOSIS — I480 Paroxysmal atrial fibrillation: Secondary | ICD-10-CM

## 2018-07-30 LAB — BASIC METABOLIC PANEL
Anion gap: 8 (ref 5–15)
BUN: 42 mg/dL — ABNORMAL HIGH (ref 6–20)
CALCIUM: 8.5 mg/dL — AB (ref 8.9–10.3)
CO2: 37 mmol/L — ABNORMAL HIGH (ref 22–32)
Chloride: 92 mmol/L — ABNORMAL LOW (ref 98–111)
Creatinine, Ser: 2.12 mg/dL — ABNORMAL HIGH (ref 0.61–1.24)
GFR calc Af Amer: 44 mL/min — ABNORMAL LOW (ref >60)
GFR calc non Af Amer: 38 mL/min — ABNORMAL LOW (ref >60)
Glucose, Bld: 120 mg/dL — ABNORMAL HIGH (ref 70–99)
Potassium: 3.8 mmol/L (ref 3.5–5.1)
Sodium: 137 mmol/L (ref 135–145)

## 2018-07-30 LAB — MAGNESIUM: MAGNESIUM: 2.3 mg/dL (ref 1.7–2.4)

## 2018-07-30 LAB — PROTIME-INR
INR: 2.95
Prothrombin Time: 30.3 seconds — ABNORMAL HIGH (ref 11.4–15.2)

## 2018-07-30 MED ORDER — PROCHLORPERAZINE 25 MG RE SUPP: 12.5000 mg | Freq: Four times a day (QID) | RECTAL | Status: DC | PRN

## 2018-07-30 MED ORDER — ATORVASTATIN CALCIUM 40 MG PO TABS
40.0000 mg | ORAL_TABLET | Freq: Every day | ORAL | Status: DC
  Administered 2018-07-31 – 2018-08-04 (×5): 40 mg via ORAL
  Filled 2018-07-30 (×5): qty 1

## 2018-07-30 MED ORDER — HYDRALAZINE HCL 50 MG PO TABS
50.0000 mg | ORAL_TABLET | Freq: Three times a day (TID) | ORAL | Status: DC
  Administered 2018-07-30 – 2018-08-04 (×15): 50 mg via ORAL
  Filled 2018-07-30 (×15): qty 1

## 2018-07-30 MED ORDER — PROCHLORPERAZINE EDISYLATE 10 MG/2ML IJ SOLN: 5.0000 mg | Freq: Four times a day (QID) | INTRAMUSCULAR | Status: DC | PRN

## 2018-07-30 MED ORDER — TORSEMIDE 20 MG PO TABS
20.0000 mg | ORAL_TABLET | Freq: Every day | ORAL | Status: DC
  Administered 2018-07-31 – 2018-08-01 (×2): 20 mg via ORAL
  Filled 2018-07-30 (×2): qty 1

## 2018-07-30 MED ORDER — ISOSORBIDE MONONITRATE ER 30 MG PO TB24
60.0000 mg | ORAL_TABLET | Freq: Every day | ORAL | Status: DC
  Administered 2018-07-31 – 2018-08-04 (×5): 60 mg via ORAL
  Filled 2018-07-30 (×5): qty 2

## 2018-07-30 MED ORDER — ACETAMINOPHEN 325 MG PO TABS
325.0000 mg | ORAL_TABLET | ORAL | Status: DC | PRN
  Administered 2018-07-31 – 2018-08-04 (×9): 650 mg via ORAL
  Filled 2018-07-30 (×11): qty 2

## 2018-07-30 MED ORDER — POLYETHYLENE GLYCOL 3350 17 G PO PACK: 17.0000 g | PACK | Freq: Every day | ORAL | Status: DC | PRN

## 2018-07-30 MED ORDER — NALOXONE HCL 0.4 MG/ML IJ SOLN: 0.4000 mg | INTRAMUSCULAR | Status: DC | PRN

## 2018-07-30 MED ORDER — WARFARIN SODIUM 2.5 MG PO TABS
3.7500 mg | ORAL_TABLET | Freq: Once | ORAL | Status: AC
  Administered 2018-07-30: 3.75 mg via ORAL
  Filled 2018-07-30: qty 1

## 2018-07-30 MED ORDER — ORAL CARE MOUTH RINSE
15.0000 mL | Freq: Two times a day (BID) | OROMUCOSAL | Status: DC
  Administered 2018-07-30 – 2018-08-03 (×8): 15 mL via OROMUCOSAL

## 2018-07-30 MED ORDER — CARVEDILOL 6.25 MG PO TABS
6.2500 mg | ORAL_TABLET | Freq: Two times a day (BID) | ORAL | Status: DC
  Administered 2018-07-30 – 2018-08-04 (×10): 6.25 mg via ORAL
  Filled 2018-07-30 (×10): qty 1

## 2018-07-30 MED ORDER — WARFARIN SODIUM 2.5 MG PO TABS
3.7500 mg | ORAL_TABLET | Freq: Once | ORAL | Status: DC
  Filled 2018-07-30: qty 1

## 2018-07-30 MED ORDER — ALUM & MAG HYDROXIDE-SIMETH 200-200-20 MG/5ML PO SUSP: 30.0000 mL | ORAL | Status: DC | PRN

## 2018-07-30 MED ORDER — DOCUSATE SODIUM 100 MG PO CAPS
100.0000 mg | ORAL_CAPSULE | Freq: Two times a day (BID) | ORAL | Status: DC
  Administered 2018-07-30 – 2018-08-03 (×7): 100 mg via ORAL
  Filled 2018-07-30 (×10): qty 1

## 2018-07-30 MED ORDER — CEFAZOLIN SODIUM-DEXTROSE 2-4 GM/100ML-% IV SOLN
2.0000 g | Freq: Three times a day (TID) | INTRAVENOUS | Status: DC
  Administered 2018-07-30 – 2018-08-04 (×13): 2 g via INTRAVENOUS
  Filled 2018-07-30 (×15): qty 100

## 2018-07-30 MED ORDER — WARFARIN - PHARMACIST DOSING INPATIENT
Freq: Every day | Status: DC
  Administered 2018-07-30 – 2018-08-02 (×2)

## 2018-07-30 MED ORDER — CLINDAMYCIN HCL 300 MG PO CAPS: 300.0000 mg | ORAL_CAPSULE | Freq: Three times a day (TID) | ORAL | 0 refills | Status: DC

## 2018-07-30 MED ORDER — GUAIFENESIN-DM 100-10 MG/5ML PO SYRP
5.0000 mL | ORAL_SOLUTION | Freq: Four times a day (QID) | ORAL | Status: DC | PRN
  Administered 2018-07-31 – 2018-08-01 (×3): 10 mL via ORAL
  Filled 2018-07-30 (×3): qty 10

## 2018-07-30 MED ORDER — POTASSIUM CHLORIDE ER 10 MEQ PO TBCR
20.0000 meq | EXTENDED_RELEASE_TABLET | Freq: Every day | ORAL | Status: DC | PRN
  Administered 2018-08-02 – 2018-08-04 (×2): 20 meq via ORAL
  Filled 2018-07-30 (×3): qty 2

## 2018-07-30 MED ORDER — AMIODARONE HCL 200 MG PO TABS
200.0000 mg | ORAL_TABLET | Freq: Two times a day (BID) | ORAL | Status: DC
  Administered 2018-07-30 – 2018-08-04 (×10): 200 mg via ORAL
  Filled 2018-07-30 (×10): qty 1

## 2018-07-30 MED ORDER — PROCHLORPERAZINE MALEATE 5 MG PO TABS: 5.0000 mg | ORAL_TABLET | Freq: Four times a day (QID) | ORAL | Status: DC | PRN

## 2018-07-30 MED ORDER — BISACODYL 10 MG RE SUPP: 10.0000 mg | Freq: Every day | RECTAL | Status: DC | PRN

## 2018-07-30 MED ORDER — GUAIFENESIN ER 600 MG PO TB12: 600.0000 mg | ORAL_TABLET | Freq: Two times a day (BID) | ORAL | Status: DC | PRN

## 2018-07-30 MED ORDER — DIGOXIN 125 MCG PO TABS
0.2500 mg | ORAL_TABLET | Freq: Every day | ORAL | Status: DC
  Administered 2018-07-31 – 2018-08-04 (×5): 0.25 mg via ORAL
  Filled 2018-07-30 (×6): qty 2

## 2018-07-30 MED ORDER — GUAIFENESIN ER 600 MG PO TB12
600.0000 mg | ORAL_TABLET | Freq: Two times a day (BID) | ORAL | Status: DC | PRN
  Administered 2018-07-30: 600 mg via ORAL
  Filled 2018-07-30: qty 1

## 2018-07-30 MED ORDER — FLEET ENEMA 7-19 GM/118ML RE ENEM: 1.0000 | ENEMA | Freq: Once | RECTAL | Status: DC | PRN

## 2018-07-30 NOTE — Progress Notes (Signed)
PMR Admission Coordinator Pre-Admission Assessment  Patient: Mark Baker is an 37 y.o., male MRN: [id number] DOB: 10/03/1980 Height: 6' 3" (190.5 cm) Weight: (unable to get pt's weight/ bed transfer )                                                                                                                                                  Insurance Information HMO:    PPO:      PCP:      IPA:      80/20:      OTHER:  PRIMARY: [employer]      Policy#: [id number]      Subscriber: Patient CM Name: Luisa Hart     Phone#: [phone] [id number]     Fax#:  [phone] Pre-Cert#: [id number]       Employer:  Josem Kaufmann [phone]) provided by Butch Penny at [place] on 07/29/18. Pt is approved from 12/18 through 12/24. Clinical updates are due to [facility]): [phone] [id number]; (f): [phone] Benefits:  Phone #: NA     Name: Navinet online portal Eff. Date: 08/12/17-08/11/18     Deduct: $1,500 (met $1,500)      Out of Pocket Max: $3,000 (Met $3,000)      Life Max: NA CIR: 90%/10%      SNF: 90%/10%; complete care managed (requires prior auth) Outpatient: 90% (auth req. 60/ST, 30/OT, 30/PT) Co-Pay: 10%  Home Health: 90%(120 visits, auth not req)      Co-Pay: 10% DME: 90%     Co-Pay: 10% Providers:  SECONDARY:       Policy#:       Subscriber:  CM Name:       Phone#:      Fax#:  Pre-Cert#:       Employer:  Benefits:  Phone #:      Name:  Eff. Date:      Deduct:       Out of Pocket Max:       Life Max:  CIR:       SNF:  Outpatient:      Co-Pay:  Home Health:       Co-Pay: DME:      Co-Pay:   Medicaid Application Date:      Case Manager:  Disability Application Date:       Case Worker:   Emergency Publishing copy Information    Name Relation Home Work [place] Other [phone]  [phone]     Current Medical History  Patient Admitting Diagnosis: Debility  History of Present Illness: Traci Plemons a 37 y.o.malewith history of  morbid obesity, OSA--non compliant with CPAP, PAF, HTN, CKD, CM with chronic combined CHF, recent right quad tendon repair 6 weeksPTA who developed purulent drainage due to post op infection and was admitted on  07/24/18 for I and D right knee by Dr. Stann Mainland.History taken from chart review and patient. Post op course complicated by acute on chronic hypoxic/hypercarbic respiratory failure with somnolence requiring BIPAP, A fib, AKI with rise in SCr to 3.86, hypotension as well as fluid overload due to cardiorenal syndrome. 2 D echo done revealing EF 25-30% --difficult study due to body habitus. Dr. Moshe Cipro consulted for input and felt that patient with ATN due to hypotension and metabolic alkalosislikely compensatory due to respiratory acidosis. UOP improving and no indications for dialysis at this time. Somnolence resolving and he was cleared for therapy evaluation. Patient noted be debilitated and CIR was consulted due to functional decline. Pt is to be admitted to CIR on 07/30/18.    Past Medical History      Past Medical History:  Diagnosis Date  . Childhood asthma    no exacerbation since age 8   . Chronic combined systolic and diastolic CHF (congestive heart failure) ([place])    a) ECHO (11/2012): EF 40-45%, RV fx difficult to see, nl size b) ECHO (05/2014): EF 20-25%, grade 2 DD, RV mildly dilated and sys fx mod reduced  . Hypertension   . Migraine    "down to maybe 2/year now; used to have them q other week" (11/26/2017)  . NICM (nonischemic cardiomyopathy) ([place])    a) (11/2012): normal coronaries  . OSA on CPAP    wears CPAP  . Postoperative wound infection    right knee  . Wears glasses     Family History  family history includes Heart failure in his father; Hypertension in his father, mother, sister, and another family member; Stroke in his maternal grandmother.  Prior Rehab/Hospitalizations:  Has the patient had major surgery during 100 days prior to  admission? Yes  Current Medications   Current Facility-Administered Medications:  .  0.9 %  sodium chloride infusion, , Intravenous, PRN, Corliss Parish, MD, Last Rate: 10 mL/hr at 07/30/18 0449, 250 mL at 07/30/18 0449 .  acetaminophen (TYLENOL) tablet 325-650 mg, 325-650 mg, Oral, Q6H PRN, Nicholes Stairs, MD, 650 mg at 07/30/18 0444 .  amiodarone (PACERONE) tablet 200 mg, 200 mg, Oral, BID, Nicholes Stairs, MD, 200 mg at 07/30/18 1007 .  atorvastatin (LIPITOR) tablet 40 mg, 40 mg, Oral, Daily, Nicholes Stairs, MD, 40 mg at 07/30/18 1007 .  carvedilol (COREG) tablet 6.25 mg, 6.25 mg, Oral, BID, Corliss Parish, MD, 6.25 mg at 07/30/18 1006 .  ceFAZolin (ANCEF) IVPB 2g/100 mL premix, 2 g, Intravenous, Q8H, Nicholes Stairs, MD, Last Rate: 200 mL/hr at 07/30/18 1402, 2 g at 07/30/18 1402 .  digoxin (LANOXIN) tablet 0.25 mg, 0.25 mg, Oral, Daily, Nicholes Stairs, MD, 0.25 mg at 07/30/18 1007 .  docusate sodium (COLACE) capsule 100 mg, 100 mg, Oral, BID, Nicholes Stairs, MD, 100 mg at 07/28/18 2132 .  guaiFENesin (MUCINEX) 12 hr tablet 600 mg, 600 mg, Oral, BID PRN, Amin, Ankit Chirag, MD, 600 mg at 07/30/18 1212 .  hydrALAZINE (APRESOLINE) tablet 50 mg, 50 mg, Oral, Q8H, Nicholes Stairs, MD, 50 mg at 07/30/18 1359 .  isosorbide mononitrate (IMDUR) 24 hr tablet 60 mg, 60 mg, Oral, Daily, Nicholes Stairs, MD, 60 mg at 07/30/18 1006 .  magnesium oxide (MAG-OX) tablet 800 mg, 800 mg, Oral, Once, Amin, Ankit Chirag, MD .  MEDLINE mouth rinse, 15 mL, Mouth Rinse, BID, Stann Mainland Elly Modena, MD, 15 mL at 07/30/18 1008 .  naloxone [facility]) injection 0.4 mg, 0.4 mg,  Intravenous, PRN, Einar Grad, RPH, 0.4 mg at 07/25/18 1903 .  ondansetron (ZOFRAN) tablet 4 mg, 4 mg, Oral, Q6H PRN **OR** ondansetron (ZOFRAN) injection 4 mg, 4 mg, Intravenous, Q6H PRN, Nicholes Stairs, MD .  potassium chloride (K-DUR) CR tablet 20 mEq, 20 mEq, Oral,  Daily PRN, Nicholes Stairs, MD .  torsemide [facility]) tablet 20 mg, 20 mg, Oral, Daily, Corliss Parish, MD, 20 mg at 07/30/18 1007 .  warfarin (COUMADIN) tablet 3.75 mg, 3.75 mg, Oral, ONCE-1800, Franky Macho, RPH .  Warfarin - Pharmacist Dosing Inpatient, , Does not apply, q1800, Einar Grad, RPH  Patients Current Diet:     Diet Order                  Diet - low sodium heart healthy         Diet regular Room service appropriate? Yes; Fluid consistency: Thin  Diet effective now               Precautions / Restrictions Precautions Precautions: Fall Other Brace: ace wrap only R LE Restrictions Weight Bearing Restrictions: No RLE Weight Bearing: Weight bearing as tolerated   Has the patient had 2 or more falls or a fall with injury in the past year?Yes  Prior Activity Level Limited Community (1-2x/wk): was working prior to knee injury in september; since injury, not worked and gets out approx 1x/week; does Product manager / Paramedic Devices/Equipment: Science writer: Crutches  Prior Device Use: Indicate devices/aids used by the patient prior to current illness, exacerbation or injury? bilateral axillary crutches  Prior Functional Level Prior Function Level of Independence: Independent Comments: Pt using crutches for the last week when injury occurred. Prior to this, independent. Does not work.  Self Care: Did the patient need help bathing, dressing, using the toilet or eating?  Independent  Indoor Mobility: Did the patient need assistance with walking from room to room (with or without device)? Independent  Stairs: Did the patient need assistance with internal or external stairs (with or without device)? Independent  Functional Cognition: Did the patient need help planning regular tasks such as shopping or remembering to take medications? Independent  Current Functional  Level Cognition  Overall Cognitive Status: Within Functional Limits for tasks assessed Orientation Level: Oriented X4 General Comments: slow to respond at times, lots of increased time to attempt to stand and needing lots of encouragement, home info different from when here in Oct    Extremity Assessment (includes Sensation/Coordination)  Upper Extremity Assessment: Generalized weakness  Lower Extremity Assessment: RLE deficits/detail, LLE deficits/detail RLE Deficits / Details: able to bend eventually to get leg under him, but in supine unable to lift or bend on his own,  LLE Deficits / Details: AROM limited in supine with body habitus, strength hip flexion 3-/5, knee extension 4/5    ADLs  Overall ADL's : Needs assistance/impaired Grooming: Wash/dry hands, Wash/dry face, Min guard, Standing Upper Body Bathing: Sitting, Min guard Lower Body Bathing: Moderate assistance, Sitting/lateral leans Upper Body Dressing : Sitting, Min guard Lower Body Dressing: Maximal assistance, Sit to/from stand Lower Body Dressing Details (indicate cue type and reason): simulated, unable to donn socks Toilet Transfer: Cueing for safety, Supervision/safety, Regular Toilet, Comfort height toilet, Grab bars, Ambulation(used crutches) Toileting- Clothing Manipulation and Hygiene: Sit to/from stand, Minimal assistance Functional mobility during ADLs: Supervision/safety, Cueing for safety(cruthces) General ADL Comments: pt required increased time and effort due to Poor endurance  Mobility  Overal bed mobility: Needs Assistance Bed Mobility: Supine to Sit Supine to sit: Mod assist, +2 for physical assistance Sit to supine: +2 for physical assistance, Max assist General bed mobility comments: up in chair upon OT arrival    Transfers  Overall transfer level: Needs assistance Equipment used: None Transfers: Sit to/from Stand Sit to Stand: Supervision Stand pivot transfers: Min assist, +2 physical  assistance General transfer comment: pt was able to stand with his UE's from recliner    Ambulation / Gait / Stairs / Emergency planning/management officer  Ambulation/Gait Ambulation/Gait assistance: Scientist, forensic (Feet): 40 Feet Assistive device: Crutches, 1 person hand held assist Gait Pattern/deviations: Step-through pattern, Step-to pattern, Wide base of support, Trunk flexed General Gait Details: gave pt instructions not to lean axillae on the crutches Gait velocity: reduced Gait velocity interpretation: <1.31 ft/sec, indicative of household ambulator    Posture / Balance Dynamic Sitting Balance Sitting balance - Comments: UE support most of the time, but could sit without Balance Overall balance assessment: Needs assistance Sitting-balance support: Bilateral upper extremity supported, Feet supported Sitting balance-Leahy Scale: Good Sitting balance - Comments: UE support most of the time, but could sit without Standing balance support: Bilateral upper extremity supported, During functional activity Standing balance-Leahy Scale: Fair Standing balance comment: could not maintain standing this date    Special needs/care consideration BiPAP/CPAP: yes Bipap at night in hospital ; CPAP at home CPM: no Continuous Drip IV: 0.9% sodium chloride unfusion Dialysis: no        Days: NA Life Vest: no Oxygen: RA on 12/18 vs 2L on 12/18 as well Special Bed: no bariatric air mattress Trach Size: NA Wound Vac (area): no      Location: no Skin: skin tear on right buttocks; right leg surgical incision                       Bowel mgmt:continent, last BM: 07/29/18 Bladder mgmt: external catheter in place Diabetic mgmt: no     Previous Home Environment Living Arrangements: Other relatives(nephew) Available Help at Discharge: Family, Available PRN/intermittently(nephew just got a job) Type of Home: House Home Layout: One level Home Access: Stairs to enter Entrance Stairs-Rails: Can  reach both Entrance Stairs-Number of Steps: [place]: No  Discharge Living Setting Plans for Discharge Living Setting: Patient's home, Lives with (comment)(with nephew and 41 yo daughter) Type of Home at Discharge: Other (Comment)(townhome) Discharge Home Layout: Two level, 1/2 bath on main level, Bed/bath upstairs Alternate Level Stairs-Rails: (unknown but full flight of stairs) Alternate Level Stairs-Number of Steps: full flight Discharge Home Access: Stairs to enter Entrance Stairs-Rails: Left Entrance Stairs-Number of Steps: 4-5 Discharge Bathroom Shower/Tub: Tub/shower unit Discharge Bathroom Toilet: Standard Discharge Bathroom Accessibility: Yes How Accessible: Accessible via walker(possibly if walker turned sideways; PTA used crutches) Does the patient have any problems obtaining your medications?: No  Social/Family/Support Systems Patient Roles: Parent Contact Information: newphew ([place]: [phone]); mother Scot Jun: [phone]); mother in law Rollene Fare [place]: [phone]) Anticipated Caregiver: newphew and friends  Anticipated Caregiver's Contact Information: see above  Ability/Limitations of Caregiver: newphew able to provide min/mod A Caregiver Availability: Other (Comment)(newphew can do days as he works nights (may have PRN nights)) Discharge Plan Discussed with Primary Caregiver: Yes(pt and his nephew) Is Caregiver In Agreement with Plan?: Yes Does Caregiver/Family have Issues with Lodging/Transportation while Pt is in Rehab?: No   Goals/Additional Needs Patient/Family Goal for Rehab: PT: Min A; OT: Min/Mod A; SLP: NA Expected  length of stay: 13-17 days Cultural Considerations: NA Dietary Needs: Regular diet, thin liquids Equipment Needs: TBD Special Service Needs: bariatric bed, bariatric equipment Pt/Family Agrees to Admission and willing to participate: Yes Program Orientation Provided & Reviewed with Pt/Caregiver Including Roles   & Responsibilities: Yes(pt and his nephew)  Barriers to Discharge: Home environment access/layout, New oxygen(pt will need to manage full flight of stairs for bed/bath)  Barriers to Discharge Comments: will need to have assist during pt's nephew's nighttime shift   Decrease burden of Care through IP rehab admission: NA   Possible need for SNF placement upon discharge: not anticipated; pt has good support from family and friends and has good prognosis for further progress with CIR.    Patient Condition: This patient's medical and functional status has changed since the consult dated: 12/17 in which the Rehabilitation Physician determined and documented that the patient's condition is appropriate for intensive rehabilitative care in an inpatient rehabilitation facility. See "History of Present Illness" (above) for medical update. Functional changes are: improvement in bed mobility from Max Ax2 to Mod A x2 and improvement in transfers from [place] A x2 to Supervision; Pt has ambulated 40 feet with supervision and AD; Pt still requires assistance for ADLs and endurance for self care tasks. Patient's medical and functional status update has been discussed with the Rehabilitation physician and patient remains appropriate for inpatient rehabilitation. Will admit to inpatient rehab today.  Preadmission Screen Completed By:  Jhonnie Garner, 07/30/2018 3:14 PM ______________________________________________________________________   Discussed status with Dr. Letta Pate on 07/30/18 at 3:14 PM and received telephone approval for admission today.  Admission Coordinator:  Jhonnie Garner, time 3:14PM/Date 07/30/18.       Cosigned by: Charlett Blake, MD at 07/30/2018 3:17 PM  Revision History    Date/Time User Provider Type Action  07/30/2018 3:17 PM Kirsteins, Luanna Salk, MD Physician Cosign  07/30/2018 3:15 PM Jhonnie Garner, Phil Campbell Rehab Admission Coordinator Addend  07/30/2018 3:13 PM Jhonnie Garner, [place]  Rehab Admission Coordinator Sign  View Details Report

## 2018-07-30 NOTE — Progress Notes (Signed)
Occupational Therapy Treatment Patient Details Name: Mark Baker MRN: [id number] DOB: 01/10/1981 Today's Date: 07/30/2018    History of present illness 37 y.o. 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  Obesity with BMI greater than 60, paroxysmal atrial fibrillation was admitted to the hospital with increasing drainage from the right knee following quadricep tendon repair about 6 weeks ago.  Underwent I&D 12/12 then developed SOB and lethargy. Required bipap for hypercarbic respiratory failure.   OT comments  Pt making good progress with functional goals with improved ALD mobility, however pt continues to require extensive assist with LB ADLs and fatigues easily during ADL tasks. Pt requires increased time and effort to complete ambulation using crutches to bathroom, toileting tasks and standing at sink for grooming/hygiene tasks, simulated LB ADLs. OT will continue to follow acutely  Follow Up Recommendations  CIR    Equipment Recommendations  Other (comment)(TBD at next venue of care)    Recommendations for Other Services      Precautions / Restrictions Precautions Precautions: Fall Required Braces or Orthoses: Other Brace Other Brace: ace wrap only R LE Restrictions Weight Bearing Restrictions: No RLE Weight Bearing: Weight bearing as tolerated       Mobility Bed Mobility               General bed mobility comments: up in chair upon OT arrival  Transfers   Equipment used: None Transfers: Sit to/from Stand Sit to Stand: Supervision              Balance Overall balance assessment: Needs assistance Sitting-balance support: Bilateral upper extremity supported;Feet supported Sitting balance-Leahy Scale: Good     Standing balance support: Bilateral upper extremity supported;During functional  activity Standing balance-Leahy Scale: Fair                             ADL either performed or assessed with clinical judgement   ADL Overall ADL's : Needs assistance/impaired     Grooming: Wash/dry hands;Wash/dry face;Min guard;Standing   Upper Body Bathing: Sitting;Min guard   Lower Body Bathing: Moderate assistance;Sitting/lateral leans   Upper Body Dressing : Sitting;Min guard   Lower Body Dressing: Maximal assistance;Sit to/from stand Lower Body Dressing Details (indicate cue type and reason): simulated, unable to donn socks Toilet Transfer: Cueing for safety;Supervision/safety;Regular Toilet;Comfort height toilet;Grab bars;Ambulation(used crutches)   Toileting- Clothing Manipulation and Hygiene: Sit to/from stand;Minimal assistance       Functional mobility during ADLs: Supervision/safety;Cueing for safety(cruthces) General ADL Comments: pt required increased time and effort due to Poor endurance     Vision Patient Visual Report: No change from baseline     Perception     Praxis      Cognition Arousal/Alertness: Awake/alert Behavior During Therapy: WFL for tasks assessed/performed Overall Cognitive Status: Within Functional Limits for tasks assessed                                          Exercises     Shoulder Instructions       General Comments      Pertinent Vitals/ Pain       Pain Assessment: No/denies pain Pain Intervention(s): Monitored during session;Repositioned  Home Living  Prior Functioning/Environment              Frequency  Min 2X/week        Progress Toward Goals  OT Goals(current goals can now be found in the care plan section)  Progress towards OT goals: Progressing toward goals     Plan Discharge plan remains appropriate    Co-evaluation                 AM-PAC OT "6 Clicks" Daily Activity     Outcome Measure   Help  from another person eating meals?: None Help from another person taking care of personal grooming?: A Little Help from another person toileting, which includes using toliet, bedpan, or urinal?: A Lot Help from another person bathing (including washing, rinsing, drying)?: A Lot Help from another person to put on and taking off regular upper body clothing?: A Little Help from another person to put on and taking off regular lower body clothing?: A Lot 6 Click Score: 16    End of Session Equipment Utilized During Treatment: Other (comment)(wide 3 in 1 over toilet, crutches)  OT Visit Diagnosis: Unsteadiness on feet (R26.81);Other abnormalities of gait and mobility (R26.89);Muscle weakness (generalized) (M62.81);History of falling (Z91.81);Pain   Activity Tolerance Patient limited by fatigue   Patient Left in chair;with call bell/phone within reach   Nurse Communication          Time: 0940-7680 OT Time Calculation (min): 34 min  Charges: OT General Charges $OT Visit: 1 Visit OT Treatments $Self Care/Home Management : 8-22 mins $Therapeutic Activity: 8-22 mins     Galen Manila 07/30/2018, 12:47 PM

## 2018-07-30 NOTE — Discharge Summary (Signed)
Patient ID: Mark Baker MRN: [id number] DOB/AGE: Jan 24, 1981 37 y.o.  Admit date: 07/23/2018 Discharge date:   Primary Diagnosis: Right postoperative knee infection  Admission Diagnoses:  Past Medical History:  Diagnosis Date  . Childhood asthma    no exacerbation since age 60   . Chronic combined systolic and diastolic CHF (congestive heart failure) ([place])    a) ECHO (11/2012): EF 40-45%, RV fx difficult to see, nl size b) ECHO (05/2014): EF 20-25%, grade 2 DD, RV mildly dilated and sys fx mod reduced  . Hypertension   . Migraine    "down to maybe 2/year now; used to have them q other week" (11/26/2017)  . NICM (nonischemic cardiomyopathy) ([place])    a) (11/2012): normal coronaries  . OSA on CPAP    wears CPAP  . Postoperative wound infection    right knee  . Wears glasses    Discharge Diagnoses:   Principal Problem:   Acute respiratory failure with hypoxia and hypercapnia (HCC) Active Problems:   Sleep apnea   HTN (hypertension)   Hyperlipidemia   Chronic HFrEF (heart failure with reduced ejection fraction) (HCC)   CKD (chronic kidney disease), stage III (HCC)   Erectile dysfunction   Quadriceps tendon rupture, right, initial encounter   Infection of right knee (HCC)   AKI (acute kidney injury) ([place])   Supplemental oxygen dependent   PAF (paroxysmal atrial fibrillation) (HCC)   Benign essential HTN  Estimated body mass index is 65.75 kg/m as calculated from the following:   Height as of this encounter: '6\' 3"'$  (1.905 m).   Weight as of this encounter: 238.6 kg.  Procedure:  Procedure(s) (LRB): Right knee  incisional irrigation and debridement (Right)   Consults: cardiology, pulmonary/intensive care, rehabilitation medicine and nephrology and general medicine  HPI:  Mark Baker is an unfortunate 37 year old male who is now over 6 weeks out from quadriceps tendon repair and developed a postoperative infection 6 weeks out.  He was indicated for irrigation and debridement given  the infection. Laboratory Data: No results displayed because visit has over 200 results.    Admission on 06/10/2018, Discharged on 06/11/2018  Component Date Value Ref Range Status  . WBC 06/10/2018 6.5  4.0 - 10.5 K/uL Final  . RBC 06/10/2018 4.75  4.22 - 5.81 MIL/uL Final  . Hemoglobin 06/10/2018 12.8* 13.0 - 17.0 g/dL Final  . HCT 06/10/2018 44.8  39.0 - 52.0 % Final  . MCV 06/10/2018 94.3  80.0 - 100.0 fL Final  . MCH 06/10/2018 26.9  26.0 - 34.0 pg Final  . MCHC 06/10/2018 28.6* 30.0 - 36.0 g/dL Final  . RDW 06/10/2018 13.7  11.5 - 15.5 % Final  . Platelets 06/10/2018 225  150 - 400 K/uL Final  . nRBC 06/10/2018 0.0  0.0 - 0.2 % Final   Performed at [facility] [address], [place], [place] [address]  . Sodium 06/10/2018 140  135 - 145 mmol/L Final  . Potassium 06/10/2018 3.3* 3.5 - 5.1 mmol/L Final  . Chloride 06/10/2018 103  98 - 111 mmol/L Final  . CO2 06/10/2018 28  22 - 32 mmol/L Final  . Glucose, Bld 06/10/2018 103* 70 - 99 mg/dL Final  . BUN 06/10/2018 16  6 - 20 mg/dL Final  . Creatinine, Ser 06/10/2018 1.44* 0.61 - 1.24 mg/dL Final  . Calcium 06/10/2018 8.6* 8.9 - 10.3 mg/dL Final  . GFR calc non Af Amer 06/10/2018 >60  >60 mL/min Final  . GFR calc Af Amer 06/10/2018 >  60  >60 mL/min Final   Comment: (NOTE) The eGFR has been calculated using the CKD EPI equation. This calculation has not been validated in all clinical situations. eGFR's persistently <60 mL/min signify possible Chronic Kidney Disease.   Georgiann Hahn gap 06/10/2018 9  5 - 15 Final   Performed at [facility], [place] [address] [place], [place] [address]  . Prothrombin Time 06/10/2018 14.7  11.4 - 15.2 seconds Final  . INR 06/10/2018 1.16   Final   Performed at [facility], [place] [address] [place], [place] [address]  . Prothrombin Time 06/11/2018 15.3* 11.4 - 15.2 seconds Final  . INR 06/11/2018 1.22   Final   Performed at [facility], [place] [address] [place], [place]  [address]     X-Rays:Us Renal  Result Date: 07/26/2018 CLINICAL DATA:  Acute renal insufficiency. EXAM: RENAL / URINARY TRACT ULTRASOUND COMPLETE COMPARISON:  None. FINDINGS: Right Kidney: Renal measurements: 12.3 x 8 x 7.6 cm = volume: 392.1 mL. Limited evaluation. Not well visualized. Left Kidney: Renal measurements: 11.5 x 7.3 x 9.5 cm = volume: 414.9 mL. Not well visualized. Limited evaluation. Bladder: Not visualized. IMPRESSION: 1. Severely limited study due to patient body habitus. No gross renal abnormalities noted on limited images. Electronically Signed   By: Dorise Bullion III M.D   On: 07/26/2018 14:57   Dg Chest Port 1 View  Result Date: 07/26/2018 CLINICAL DATA:  Acute renal insufficiency. EXAM: PORTABLE CHEST 1 VIEW COMPARISON:  July 25, 2018 FINDINGS: Marked enlargement of the cardiac silhouette, unchanged. No pneumothorax. No overt edema. No focal infiltrate. IMPRESSION: Marked enlargement the cardiac silhouette, stable. No overt edema or other acute abnormalities. Electronically Signed   By: Dorise Bullion III M.D   On: 07/26/2018 10:01   Dg Chest Port 1 View  Result Date: 07/25/2018 CLINICAL DATA:  Dyspnea EXAM: PORTABLE CHEST 1 VIEW COMPARISON:  Portable exam 1116 hours compared to 11/25/2017 FINDINGS: Enlargement of cardiac silhouette with pulmonary vascular congestion. Mild perihilar infiltrates likely pulmonary edema. No gross pleural effusion or pneumothorax IMPRESSION: Enlargement of cardiac silhouette with pulmonary vascular congestion and mild pulmonary edema. Electronically Signed   By: Lavonia Dana M.D.   On: 07/25/2018 11:35    EKG: Orders placed or performed during the hospital encounter of 12/29/17  . EKG 12-Lead  . EKG 12-Lead     Hospital Course: Mark Baker is a 37 y.o. who was admitted to Hospital. They were brought to the operating room on 07/23/2018 and underwent Procedure(s): Right knee  incisional irrigation and debridement.  Patient tolerated the  procedure well and was later transferred to the recovery room and then to the orthopaedic floor for postoperative care.  During his postoperative course he was noted to be, very somnolent on the floor.  He was noted to be hypercapnic.  He was in acute on chronic congestive heart failure with fluid retention and subsequent acute kidney injury and failure with concomitant pulmonary failure.  He was transferred to the pulmonary could have care floor.  He was evaluated by the pulmonary care team, cardiology team, and nephrology team.  He was stabilizedIn the unit and transferred back to the general medicine floor.  He was able toWe himself off of BiPAP and re-normalize his CO2.  He also was able to auto diurese and had recovery of the kidney injury.  He was assessed also on the floor and found to be appropriate for [facility] inpatient rehab.  From a  postoperative knee standpoint he was able to get up with therapy and tolerate weightbearing as tolerated.  He will need to continue with daily dry dressing changes and also 4 weeks of oral antibiotics for his MSSA postoperative knee infection.He has resumed his chronic warfarin therapy for DVT prophylaxis. Anti-infectives (From admission, onward)   Start     Dose/Rate Route Frequency Ordered Stop   07/30/18 0000  clindamycin (CLEOCIN) 300 MG capsule     300 mg Oral 3 times daily 07/30/18 1511 08/27/18 2359   07/24/18 0000  cephALEXin (KEFLEX) 500 MG capsule     500 mg Oral 4 times daily 07/24/18 1852 08/21/18 2359   07/23/18 2300  ceFAZolin (ANCEF) IVPB 2g/100 mL premix     2 g 200 mL/hr over 30 Minutes Intravenous Every 8 hours 07/23/18 1759          Diet: Cardiac diet Activity:WBAT Follow-up:in 3 weeks Disposition - Rehab Discharged Condition: fair   Discharge Instructions    Call MD / Call 911   Complete by:  As directed    If you experience chest pain or shortness of breath, CALL 911 and be transported to the hospital emergency room.  If you develope  a fever above 101 F, pus (Yogi drainage) or increased drainage or redness at the wound, or calf pain, call your surgeon's office.   Constipation Prevention   Complete by:  As directed    Drink plenty of fluids.  Prune juice may be helpful.  You may use a stool softener, such as Colace (over the counter) 100 mg twice a day.  Use MiraLax (over the counter) for constipation as needed.   Diet - low sodium heart healthy   Complete by:  As directed    Discharge instructions   Complete by:  As directed    - Daily dry dressings to right knee. - Okay for weightbearing as tolerated to right leg.  Please keep elevated and iced when able - Do not get right knee incision wet.  Maintain staples until follow-up appointment. - Follow up with Dr. Stann Mainland in 3 weeks. - Take clindamycin as directed 3 times a day for 4 weeks.   Face-to-face encounter (required for Medicare/Medicaid patients)   Complete by:  As directed    I Nicholes Stairs certify that this patient is under my care and that I, or a nurse practitioner or physician's assistant working with me, had a face-to-face encounter that meets the physician face-to-face encounter requirements with this patient on 07/24/2018. The encounter with the patient was in whole, or in part for the following medical condition(s) which is the primary reason for home health care (List medical condition):  Morbid obesity Right knee quad tendon rupture   The encounter with the patient was in whole, or in part, for the following medical condition, which is the primary reason for home health care:  s/p right knee surgery with ruptured quad tendon   I certify that, based on my findings, the following services are medically necessary home health services:  Physical therapy   Reason for Medically Necessary Home Health Services:  Therapy- Instruction on Safe use of Assistive Devices for ADLs   My clinical findings support the need for the above services:  Unable to leave home  safely without assistance and/or assistive device   Further, I certify that my clinical findings support that this patient is homebound due to:  Can transfer bed to chair only   Home Health   Complete  by:  As directed    To provide the following care/treatments:  PT   Increase activity slowly as tolerated   Complete by:  As directed      Allergies as of 07/30/2018      Reactions   Aspirin Hives, Itching, Rash   Entresto [sacubitril-valsartan] Cough   Was on Entresto 07/2016-09/2016      Medication List    TAKE these medications   acetaminophen 500 MG tablet Commonly known as:  TYLENOL Take 1,000 mg by mouth as needed for mild pain or headache.   amiodarone 200 MG tablet Commonly known as:  PACERONE Take 1 tablet (200 mg total) by mouth 2 (two) times daily.   atorvastatin 40 MG tablet Commonly known as:  LIPITOR Take 1 tablet (40 mg total) by mouth daily.   carvedilol 3.125 MG tablet Commonly known as:  COREG Take 1 tablet (3.125 mg total) by mouth 2 (two) times daily.   clindamycin 300 MG capsule Commonly known as:  CLEOCIN Take 1 capsule (300 mg total) by mouth 3 (three) times daily for 28 days.   digoxin 0.25 MG tablet Commonly known as:  LANOXIN Take 1 tablet (0.25 mg total) by mouth daily.   hydrALAZINE 50 MG tablet Commonly known as:  APRESOLINE Take 1 tablet (50 mg total) by mouth every 8 (eight) hours.   HYDROcodone-acetaminophen 5-325 MG tablet Commonly known as:  NORCO/VICODIN Take 1-2 tablets by mouth every 4 (four) hours as needed for moderate pain (pain score 4-6).   isosorbide mononitrate 60 MG 24 hr tablet Commonly known as:  IMDUR Take 1 tablet (60 mg total) by mouth daily.   KEFLEX 500 MG capsule Generic drug:  cephALEXin Take 500 mg by mouth 4 (four) times daily. What changed:  Another medication with the same name was added. Make sure you understand how and when to take each.   cephALEXin 500 MG capsule Commonly known as:  KEFLEX Take 1  capsule (500 mg total) by mouth 4 (four) times daily for 28 days. What changed:  You were already taking a medication with the same name, and this prescription was added. Make sure you understand how and when to take each.   losartan 25 MG tablet Commonly known as:  COZAAR Take 1 tablet (25 mg total) by mouth daily. What changed:  when to take this   oxyCODONE 5 MG immediate release tablet Commonly known as:  ROXICODONE Take 1-2 tablets (5-10 mg total) by mouth every 4 (four) hours as needed for moderate pain or severe pain.   Potassium Chloride ER 20 MEQ Tbcr Take 20 mEq by mouth daily. What changed:    when to take this  reasons to take this   sildenafil 100 MG tablet Commonly known as:  VIAGRA Take 1 tablet (100 mg total) by mouth daily as needed for erectile dysfunction.   spironolactone 25 MG tablet Commonly known as:  ALDACTONE Take 0.5 tablets (12.5 mg total) by mouth daily.   torsemide 20 MG tablet Commonly known as:  DEMADEX Take 40 mg (2 Tablets) in the AM and 20 mg (1 Tablet) in the PM What changed:    how much to take  how to take this  when to take this   warfarin 7.5 MG tablet Commonly known as:  COUMADIN Take as directed. If you are unsure how to take this medication, talk to your nurse or doctor. Original instructions:  Take 1 tablet (7.5 mg total) by mouth daily at 6 PM.  What changed:    when to take this  additional instructions      Follow-up Information    Nicholes Stairs, MD In 2 weeks.   Specialty:  Orthopedic Surgery Why:  For suture removal Contact information: [address] [place] [address] [phone]           Signed: Geralynn Rile, MD Orthopaedic Surgery 07/30/2018, 3:11 PM

## 2018-07-30 NOTE — Progress Notes (Signed)
ANTICOAGULATION CONSULT NOTE - Follow-Up Consult  Pharmacy Consult for warfarin Indication: history of atrial fibrillation  Allergies  Allergen Reactions  . Aspirin Hives, Itching and Rash  . Entresto [Sacubitril-Valsartan] Cough    Was on Entresto 07/2016-09/2016    Patient Measurements: Height: 6\' 3"  (190.5 cm) Weight: (unable to get pt's weight/ bed transfer ) IBW/kg (Calculated) : 84.5   Vital Signs: Temp: 97.9 F (36.6 C) (12/19 0739) Temp Source: Oral (12/19 0739) BP: 151/86 (12/19 1000) Pulse Rate: 78 (12/19 1000)  Labs: Recent Labs    07/28/18 0321 07/29/18 0231 07/30/18 0235  HGB  --  10.6*  --   HCT  --  37.3*  --   PLT  --  313  --   LABPROT 28.6* 27.9* 30.3*  INR 2.74 2.65 2.95  CREATININE 3.19* 2.66* 2.12*    Estimated Creatinine Clearance: 97.6 mL/min (A) (by C-G formula based on SCr of 2.12 mg/dL (H)).   Assessment: 73 yoM with morbidy obesity and h/o PAF admitted for R knee I&D, 6 weeks out from quadriceps tendon repair (06/10/18). Pt on warfarin PTA for h/o PAF which was initially resumed by surgical MD with plans for discharge 12/14 on home regimen. Pt decompensated with respiratory insufficiency requiring BiPAP and will remain admitted with hospitalist service, pharmacy consulted to dose warfarin.   PTA Dose = 7.5mg  Mon/Wed/Fri, 3.75mg  all other days (LD pta taken 07/20/18)  INR up to 2.95 (high end of therapeutic).  Continues on PTA Amiodarone 200 mg bid so not a new drug interaction.   Hgb low but stable, pltc wnl. No bleeding noted  Goal of Therapy:  INR 2-3 Monitor platelets by anticoagulation protocol: Yes   Plan:  -Warfarin 3.75mg  po tonight -Daily protime/INR  Christoper Fabian, PharmD, BCPS Clinical pharmacist  **Pharmacist phone directory can now be found on [email or web address] ([email or web address]).  Listed under [facility]. 07/30/2018

## 2018-07-30 NOTE — Progress Notes (Signed)
Patient received via bariatric bed alert and oriented x 4 at 1725. No complaint of pain. Oriented patient to room and call bell system. Patient verbalized understanding of admission process.  Right knee dressing saturated with serous sanguinous drainage. Right knee staples intact. Blister to right foot intact but leaking. Changed dressing to right knee and placed allevyn to right blister site on top of foot . Continue with plan of care. Cleotilde Neer

## 2018-07-30 NOTE — Care Management Note (Signed)
Case Management Note Initial CM note completed by Kingsley Plan, RN 07/24/2018, 1:11 PM   Patient Details  Name: Mark Baker MRN: [id number] Date of Birth: March 17, 1981  Subjective/Objective:                    Action/Plan: Patient recently hospitalized and discharged. Patient has [employer], which requires NCM to call referral for home health to [facility] and fax orders and clinicals. [facility] arranges home health and calls patient directly. Previous discharge CM made referral to [facility] on last discharge. Spoke with patient [facility] did not find a home health agency to take referral. Explained to patient NCM will follow for recommendations and orders, however, NCM will have to submit everything to [facility] again. Patient voiced understanding.  Expected Discharge Date:  07/30/18               Expected Discharge Plan:  IP Rehab Facility  In-House Referral:  Clinical Social Work  Discharge planning Services  CM Consult  Post Acute Care Choice:  IP Rehab Choice offered to:  Patient  DME Arranged:    DME Agency:     HH Arranged:    HH Agency:     Status of Service:  Completed, signed off  If discussed at [employer], dates discussed:    Discharge Disposition: IP rehab  Additional Comments:  07/30/18- 1530- Mark Pierini RN, CM- pt s/p I&D with acute resp. Failure post procedure- now off Bipap, with recommendations for CIR, consult has been placed to CIR with insurance approval received on 07/29/18 for IP rehab. Bed available and pt has been medically cleared today to transition to IP rehab. Pt to d/c later today to [facility] IP rehab.   Mark Alpers [place], RN 07/30/2018, 3:31 PM [phone] 4E Transitions of Care RN Case Manager

## 2018-07-30 NOTE — H&P (Signed)
Physical Medicine and Rehabilitation Admission H&P     CC: Debility due to multiple medical issues     HPI: Mark [place] is a 37 year old male with history of morbid obesity, OSA--noncompliant with CPAP, PAF, hypertension, CKD, CM with chronic combined CHF, recent right quad tendon repair 6 weeks prior to admission developed purulent drainage from incision due to postop infection.  He was admitted on 07/24/2018 for I&D right knee by Dr. Aundria Rud.  Postop course complicated by acute on chronic hypoxic/hypercarbic respiratory failure with somnolence requiring BiPAP, A. fib, AKI with rise in serum creatinine to 3.86, hypotension as well as fluid overload due to cardiorenal syndrome.     2D echo done showing EF of 25 to 30%--difficult study due to body habitus.  Dr. Kathrene Bongo consulted for input and felt that patient with ATN due to hypotension and metabolic alkalosis was likely compensatory to respiratory acidosis.  Urine output is improving and patient without indications for hemodialysis at this time.  Somnolence has resolved and patient has been refusing BiPAP.   Has been transitioned from IV heparin to warfarin. Therapy evaluations done revealing debility and CIR was recommended due to functional deficits     Review of Systems  Constitutional: Negative for chills and fever.  HENT: Negative for hearing loss and tinnitus.   Eyes: Negative for blurred vision and double vision.  Respiratory: Positive for cough. Negative for shortness of breath.        "congestion"--nasal v/s facial.   Cardiovascular: Positive for leg swelling (decreased compaired to baseline). Negative for chest pain and palpitations.  Gastrointestinal: Negative for constipation, heartburn and nausea.  Genitourinary: Negative for dysuria and urgency.  Musculoskeletal: Negative for myalgias and neck pain.  Skin: Negative for rash.  Neurological: Positive for weakness and headaches.  Psychiatric/Behavioral: The patient is not  nervous/anxious and does not have insomnia.             Past Medical History:  Diagnosis Date  . Childhood asthma      no exacerbation since age 68   . Chronic combined systolic and diastolic CHF (congestive heart failure) (HCC)      a) ECHO (11/2012): EF 40-45%, RV fx difficult to see, nl size b) ECHO (05/2014): EF 20-25%, grade 2 DD, RV mildly dilated and sys fx mod reduced  . Hypertension    . Migraine      "down to maybe 2/year now; used to have them q other week" (11/26/2017)  . NICM (nonischemic cardiomyopathy) (HCC)      a) (11/2012): normal coronaries  . OSA on CPAP      wears CPAP  . Postoperative wound infection      right knee  . Wears glasses           Past Surgical History:  Procedure Laterality Date  . CARDIOVERSION N/A 11/07/2016    Procedure: CARDIOVERSION;  Surgeon: Dolores Patty, MD;  Location: [facility] OR;  Service: Cardiovascular;  Laterality: N/A;  Will need extra help positioning patient  . INCISION AND DRAINAGE OF WOUND Right 07/23/2018    Procedure: Right knee  incisional irrigation and debridement;  Surgeon: Yolonda Kida, MD;  Location: [facility] OR;  Service: Orthopedics;  Laterality: Right;  . KNEE ARTHROSCOPY Right 2003    ANTERIOR CRUCIATE LIGAMENT REPAIR   . LEFT HEART CATHETERIZATION WITH CORONARY ANGIOGRAM N/A 11/24/2012    Procedure: LEFT HEART CATHETERIZATION WITH CORONARY ANGIOGRAM;  Surgeon: Peter M Swaziland, MD;  Location: [facility] CATH LAB;  Service: Cardiovascular;  Laterality:  N/A;  . QUADRICEPS TENDON REPAIR Right 06/10/2018    Procedure: REPAIR QUADRICEP TENDON;  Surgeon: Yolonda Kida, MD;  Location: [facility] OR;  Service: Orthopedics;  Laterality: Right;  . RIGHT/LEFT HEART CATH AND CORONARY ANGIOGRAPHY N/A 03/18/2017    Procedure: Right/Left Heart Cath and Coronary Angiography;  Surgeon: Dolores Patty, MD;  Location: [facility] INVASIVE CV LAB;  Service: Cardiovascular;  Laterality: N/A;  . TEE WITHOUT CARDIOVERSION N/A 11/07/2016    Procedure:  TRANSESOPHAGEAL ECHOCARDIOGRAM (TEE);  Surgeon: Dolores Patty, MD;  Location: [facility] OR;  Service: Cardiovascular;  Laterality: N/A;         Family History  Problem Relation Age of Onset  . Hypertension Father    . Heart failure Father          paternal side of family   . Hypertension Sister    . Stroke Maternal Grandmother    . Hypertension Other          maternal side of family   . Hypertension Mother    . Diabetes Neg Hx    . Hyperlipidemia Neg Hx        Social History:  Divorced. Nephew lives with him--starting a job this week. He stayed with his mother past last surgery--reports nephew will be with him during the day and daughter will be there at nights. 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 He reports that he has never smoked. He has never used smokeless tobacco. He reports previous alcohol use--none X 1 year. He reports that he does not use drugs.           Allergies  Allergen Reactions  . Aspirin Hives, Itching and Rash  . Entresto [Sacubitril-Valsartan] Cough      Was on Entresto 07/2016-09/2016            Medications Prior to Admission  Medication Sig Dispense Refill  . acetaminophen (TYLENOL) 500 MG tablet Take 1,000 mg by mouth as needed for mild pain or headache.      Marland Kitchen amiodarone (PACERONE) 200 MG tablet Take 1 tablet (200 mg total) by mouth 2 (two) times daily. 60 tablet 11  . atorvastatin (LIPITOR) 40 MG tablet Take 1 tablet (40 mg total) by mouth daily. 30 tablet 11  . carvedilol (COREG) 3.125 MG tablet Take 1 tablet (3.125 mg total) by mouth 2 (two) times daily. 60 tablet 11  . cephALEXin (KEFLEX) 500 MG capsule Take 500 mg by mouth 4 (four) times daily.      . digoxin (LANOXIN) 0.25 MG tablet Take 1 tablet (0.25 mg total) by mouth daily. 30 tablet 11  . hydrALAZINE (APRESOLINE) 50 MG tablet Take 1 tablet (50 mg total) by mouth every 8 (eight) hours. 90 tablet 11  . isosorbide mononitrate (IMDUR) 60 MG 24 hr  tablet Take 1 tablet (60 mg total) by mouth daily. 30 tablet 11  . losartan (COZAAR) 25 MG tablet Take 1 tablet (25 mg total) by mouth daily. (Patient taking differently: Take 25 mg by mouth every evening. ) 30 tablet 11  . Potassium Chloride ER 20 MEQ TBCR Take 20 mEq by mouth daily. (Patient taking differently: Take 20 mEq by mouth daily as needed (cramping). ) 30 tablet 11  . sildenafil (VIAGRA) 100 MG tablet Take 1 tablet (100 mg total) by mouth daily as needed for erectile dysfunction. 5 tablet 3  . spironolactone (ALDACTONE) 25 MG tablet Take 0.5 tablets (12.5 mg total) by mouth  daily. 15 tablet 11  . torsemide (DEMADEX) 20 MG tablet Take 40 mg (2 Tablets) in the AM and 20 mg (1 Tablet) in the PM (Patient taking differently: Take 20-40 mg by mouth See admin instructions. Take 40 mg (2 Tablets) in the AM and 20 mg (1 Tablet) in the PM) 90 tablet 11  . warfarin (COUMADIN) 7.5 MG tablet Take 1 tablet (7.5 mg total) by mouth daily at 6 PM. (Patient taking differently: Take 7.5 mg by mouth See admin instructions. Take 1 tablet (7.5 mg) by mouth on Mondays, Wednesdays, & Fridays at 6pm Take 0.5 tablet (3.75 mg) by mouth Sundays, Tuesdays, Thursdays, Saturdays at 6pm.) 45 tablet 6  . oxyCODONE (ROXICODONE) 5 MG immediate release tablet Take 1-2 tablets (5-10 mg total) by mouth every 4 (four) hours as needed for moderate pain or severe pain. (Patient not taking: Reported on 07/23/2018) 45 tablet 0      Drug Regimen Review  Drug regimen was reviewed and remains appropriate with no significant issues identified   Home: Home Living Family/patient expects to be discharged to:: Private residence Living Arrangements: Other relatives(nephew) Available Help at Discharge: Family, Available PRN/intermittently(nephew just got a job) Type of Home: House Home Access: Stairs to enter Secretary/administratorntrance Stairs-Number of Steps: flight Entrance Stairs-Rails: Can reach both Home Layout: One level Home Equipment:  Crutches   Functional History: Prior Function Level of Independence: Independent Comments: Pt using crutches for the last week when injury occurred. Prior to this, independent. Does not work.   Functional Status:  Mobility: Bed Mobility Overal bed mobility: Needs Assistance Bed Mobility: Supine to Sit Supine to sit: Mod assist, +2 for physical assistance Sit to supine: +2 for physical assistance, Max assist General bed mobility comments: up in chair when PT arrived Transfers Overall transfer level: Needs assistance Equipment used: None Transfers: Sit to/from Stand Sit to Stand: Supervision Stand pivot transfers: Min assist, +2 physical assistance General transfer comment: pt was able to stand with his UE's from recliner Ambulation/Gait Ambulation/Gait assistance: Supervision Gait Distance (Feet): 40 Feet Assistive device: Crutches, 1 person hand held assist Gait Pattern/deviations: Step-through pattern, Step-to pattern, Wide base of support, Trunk flexed General Gait Details: gave pt instructions not to lean axillae on the crutches Gait velocity: reduced Gait velocity interpretation: <1.31 ft/sec, indicative of household ambulator   ADL: ADL Overall ADL's : Needs assistance/impaired Grooming: Wash/dry hands, Therapist, nutritionalWash/dry face, Min guard, Sitting Upper Body Bathing: Sitting, Minimal assistance Lower Body Bathing: Maximal assistance Upper Body Dressing : Minimal assistance, Sitting Lower Body Dressing: Total assistance Toilet Transfer: Minimal assistance, +2 for physical assistance, Cueing for safety, Stand-pivot, BSC Toileting- Clothing Manipulation and Hygiene: Maximal assistance, Sit to/from stand Functional mobility during ADLs: Minimal assistance, +2 for physical assistance   Cognition: Cognition Overall Cognitive Status: Within Functional Limits for tasks assessed Orientation Level: Oriented X4 Cognition Arousal/Alertness: Awake/alert Behavior During Therapy: WFL for  tasks assessed/performed Overall Cognitive Status: Within Functional Limits for tasks assessed General Comments: slow to respond at times, lots of increased time to attempt to stand and needing lots of encouragement, home info different from when here in Oct     Blood pressure (!) 151/86, pulse 78, temperature 97.9 F (36.6 C), temperature source Oral, resp. rate 16, height 6\' 3"  (1.905 m), weight (!) 238.6 kg, SpO2 98 %. Physical Exam  Nursing note and vitals reviewed. Constitutional: He is oriented to person, place, and time. He appears well-developed and well-nourished.  Morbidly obese male, up in chair. NAD. Sounds nasally.  Respiratory: No respiratory distress.  On 2 liter oxygen per [place]--occasional dry cough.  Pursed lip breathing at times.   Musculoskeletal:     Comments: BLE with 2+ pitting edema.   Right knee with ABD pad on lower aspect of incision which was soaked with serosanguinous drainage.  Staples intact no erythema. + suprapatellar effusion Neurological: He is alert and oriented to person, place, and time.  Speech clearer and spontaneous. Able to follow basic commands without difficulty.    Skin: dry on abd and ant leg Bull[place] dorsum of Right forefoot draining serous fluid Motor 5/5 in BIlateral delt biceps triceps 2- Right HF, 4/5 Ankle DF, LLE 3- HF 4/4 KE, and ankle DF   Lab Results Last 48 Hours        Results for orders placed or performed during the hospital encounter of 07/23/18 (from the past 48 hour(s))  Protime-INR     Status: Abnormal    Collection Time: 07/29/18  2:31 AM  Result Value Ref Range    Prothrombin Time 27.9 (H) 11.4 - 15.2 seconds    INR 2.65        Comment: Performed at [facility], [address], [place], [place] [address]  Basic metabolic panel     Status: Abnormal    Collection Time: 07/29/18  2:31 AM  Result Value Ref Range    Sodium 136 135 - 145 mmol/L    Potassium 3.8 3.5 - 5.1 mmol/L    Chloride 89 (L) 98 - 111 mmol/L     CO2 35 (H) 22 - 32 mmol/L    Glucose, Bld 100 (H) 70 - 99 mg/dL    BUN 50 (H) 6 - 20 mg/dL    Creatinine, Ser 6.04 (H) 0.61 - 1.24 mg/dL    Calcium 8.2 (L) 8.9 - 10.3 mg/dL    GFR calc non Af Amer 29 (L) >60 mL/min    GFR calc Af Amer 34 (L) >60 mL/min    Anion gap 12 5 - 15      Comment: Performed at [facility], [address], [place], [place] [address]  Magnesium     Status: None    Collection Time: 07/29/18  2:31 AM  Result Value Ref Range    Magnesium 2.4 1.7 - 2.4 mg/dL      Comment: Performed at [facility], [address], [place], [place] [address]  CBC     Status: Abnormal    Collection Time: 07/29/18  2:31 AM  Result Value Ref Range    WBC 8.5 4.0 - 10.5 K/uL    RBC 4.08 (L) 4.22 - 5.81 MIL/uL    Hemoglobin 10.6 (L) 13.0 - 17.0 g/dL    HCT 78.2 (L) 95.6 - 52.0 %    MCV 91.4 80.0 - 100.0 fL    MCH 26.0 26.0 - 34.0 pg    MCHC 28.4 (L) 30.0 - 36.0 g/dL    RDW 21.3 08.6 - 57.8 %    Platelets 313 150 - 400 K/uL    nRBC 0.0 0.0 - 0.2 %      Comment: Performed at [facility], [address] [place], [place] [address]  Protime-INR     Status: Abnormal    Collection Time: 07/30/18  2:35 AM  Result Value Ref Range    Prothrombin Time 30.3 (H) 11.4 - 15.2 seconds    INR 2.95        Comment: Performed at [facility]  [facility], [address], [place], [place] [address]  Basic metabolic panel     Status: Abnormal    Collection Time: 07/30/18  2:35 AM  Result Value Ref Range    Sodium 137 135 - 145 mmol/L    Potassium 3.8 3.5 - 5.1 mmol/L    Chloride 92 (L) 98 - 111 mmol/L    CO2 37 (H) 22 - 32 mmol/L    Glucose, Bld 120 (H) 70 - 99 mg/dL    BUN 42 (H) 6 - 20 mg/dL    Creatinine, Ser 6.04 (H) 0.61 - 1.24 mg/dL    Calcium 8.5 (L) 8.9 - 10.3 mg/dL    GFR calc non Af Amer 38 (L) >60 mL/min    GFR calc Af Amer 44 (L) >60 mL/min    Anion gap 8 5 - 15      Comment: Performed at [facility], [address] [place], [place] [address]  Magnesium      Status: None    Collection Time: 07/30/18  2:35 AM  Result Value Ref Range    Magnesium 2.3 1.7 - 2.4 mg/dL      Comment: Performed at [facility], [address], [place], [place] [address]      Imaging Results (Last 48 hours)  No results found.           Medical Problem List and Plan: 1.  debility secondary to post op VDRF 2.  DVT Prophylaxis/Anticoagulation: Pharmaceutical: Coumadin 3. Pain Management: Tylenol as needed 4. Mood: LCSW to follow for evaluation and support 5. Neuropsych: This patient is capable of making decisions on his own behalf. 6. Skin/Wound Care: Routine pressure relief measures 7. Fluids/Electrolytes/Nutrition: Monitor I's and O's.  Check lites in a.m. 8. CM with combined CHF: Check weights daily and monitor for signs of overload.  Continue Demadex, Imdur, Apresoline and Lipitor 9.  Acute on chronic CKD: Continue to monitor for trend.  Baseline SCr 1.4-1.5 range? Serum creatinine trending down--now 2.13. 10. HTN: On Imdur, Demadex and Apresoline monitor blood pressure twice daily.  11. Acute on chronic respiratory failure/OSA: Non-compliant with BIPAP. Continue to monitor respiratory status. 12. Morbid obesity: Educated patient on heart healthy diet as well as importance of weight loss to help promote mobility and overall health. 13. A fib: Monitor heart rate twice daily.  On amiodarone and Coreg twice daily as well as digoxin daily.  Continue Coumadin      Post Admission Physician Evaluation: 1. Functional deficits secondary  to debility. 2. Patient admitted to receive collaborative, interdisciplinary care between the physiatrist, rehab nursing staff, and therapy team. 3. Patient's level of medical complexity and substantial therapy needs in context of that medical necessity cannot be provided at a lesser intensity of care. 4. Patient has experienced substantial functional loss from his/her baseline. udging by the patient's diagnosis, physical exam,  and functional history, the patient has potential for functional progress which will result in measurable gains while on inpatient rehab.  These gains will be of substantial and practical use upon discharge in facilitating mobility and self-care at the household level. 5. Physiatrist will provide 24 hour management of medical needs as well as oversight of the therapy plan/treatment and provide guidance as appropriate regarding the interaction of the two. 6. 24 hour rehab nursing will assist in the management of  bladder management, bowel management, safety, skin/wound care, disease management and medication administration  and help integrate therapy concepts, techniques,education, etc. 7. PT  will assess and treat for:  Marland Kitchen  Goals are: independent with assistive device. 8. OT will assess and treat for  .  Goals are: independent with increased time.  9. SLP will assess and treat for  .  Goals are: N/A. 10. Case Management and Social Worker will assess and treat for psychological issues and discharge planning. 11. Team conference will be held weekly to assess progress toward goals and to determine barriers to discharge. 12.  Patient will receive at least 3 hours of therapy per day at least 5 days per week. 13. ELOS and Prognosis:13-17d  excellent  "I have personally performed a face to face diagnostic evaluation of this patient.  Additionally, I have reviewed and concur with the physician assistant's documentation above."  Erick Colace M.D. [facility] FAAPM&R (Sports Med, Neuromuscular Med) Diplomate Am Board of Electrodiagnostic Med    Jacquelynn Cree, PA-C 07/30/2018

## 2018-07-30 NOTE — Progress Notes (Signed)
Physical Medicine and Rehabilitation Consult   Reason for Consult: Debility Referring Physician: Dr. Nelson Chimes   HPI: Mark Baker is a 36 y.o. male with history of morbid obesity, OSA--non compliant with CPAP, PAF, HTN, CKD, CM with chronic combined CHF, recent right quad tendon repair 6 weels PTA who developed purulent drainage due to post op infection and was admitted on 07/24/18 for I and D right knee by Dr. Aundria Rud.  History taken from chart review and patient.  Post op course complicated by acute on chronic hypoxic/hypercarbic respiratory failure with somnolence requiring BIPAP, A fib,  AKI with rise in SCr to 3.86, hypotension as well as  fluid overload due to cardiorenal syndrome. 2 D echo done revealing EF 25-30% --difficult study due to body habitus.  Dr. Kathrene Bongo consulted yesterday for input and felt that patient with  ATN due to hypotension and metabolic alkalosis likely compensatory due to respiratory acidosis.  UOP improving and no indications for dialysis at this time. Somnolence resolving and he was cleared for therapy evaluation today. Patient noted be debilitated and CIR due to functional decline.    Review of Systems  Constitutional: Negative for chills and fever.  HENT: Negative for hearing loss and tinnitus.   Eyes: Negative for blurred vision and double vision.  Respiratory: Positive for cough and wheezing.   Cardiovascular: Positive for leg swelling. Negative for chest pain and palpitations.  Gastrointestinal: Negative for heartburn and nausea.  Genitourinary: Negative for dysuria and urgency.  Musculoskeletal: Positive for joint pain and myalgias.  Skin: Negative for itching and rash.  Neurological: Positive for weakness.  Psychiatric/Behavioral: Negative for depression.  All other systems reviewed and are negative.         Past Medical History:  Diagnosis Date  . Childhood asthma    no exacerbation since age 80   . Chronic combined systolic and  diastolic CHF (congestive heart failure) (HCC)    a) ECHO (11/2012): EF 40-45%, RV fx difficult to see, nl size b) ECHO (05/2014): EF 20-25%, grade 2 DD, RV mildly dilated and sys fx mod reduced  . Hypertension   . Migraine    "down to maybe 2/year now; used to have them q other week" (11/26/2017)  . NICM (nonischemic cardiomyopathy) (HCC)    a) (11/2012): normal coronaries  . OSA on CPAP    wears CPAP  . Postoperative wound infection    right knee  . Wears glasses          Past Surgical History:  Procedure Laterality Date  . CARDIOVERSION N/A 11/07/2016   Procedure: CARDIOVERSION;  Surgeon: Dolores Patty, MD;  Location: [facility] OR;  Service: Cardiovascular;  Laterality: N/A;  Will need extra help positioning patient  . INCISION AND DRAINAGE OF WOUND Right 07/23/2018   Procedure: Right knee  incisional irrigation and debridement;  Surgeon: Yolonda Kida, MD;  Location: [facility] OR;  Service: Orthopedics;  Laterality: Right;  . KNEE ARTHROSCOPY Right 2003   ANTERIOR CRUCIATE LIGAMENT REPAIR   . LEFT HEART CATHETERIZATION WITH CORONARY ANGIOGRAM N/A 11/24/2012   Procedure: LEFT HEART CATHETERIZATION WITH CORONARY ANGIOGRAM;  Surgeon: Peter M Swaziland, MD;  Location: [facility] CATH LAB;  Service: Cardiovascular;  Laterality: N/A;  . QUADRICEPS TENDON REPAIR Right 06/10/2018   Procedure: REPAIR QUADRICEP TENDON;  Surgeon: Yolonda Kida, MD;  Location: [facility] OR;  Service: Orthopedics;  Laterality: Right;  . RIGHT/LEFT HEART CATH AND CORONARY ANGIOGRAPHY N/A 03/18/2017   Procedure: Right/Left Heart Cath and Coronary Angiography;  Surgeon: Bensimhon,  Bevelyn Buckles, MD;  Location: [facility] INVASIVE CV LAB;  Service: Cardiovascular;  Laterality: N/A;  . TEE WITHOUT CARDIOVERSION N/A 11/07/2016   Procedure: TRANSESOPHAGEAL ECHOCARDIOGRAM (TEE);  Surgeon: Dolores Patty, MD;  Location: [facility] OR;  Service: Cardiovascular;  Laterality: N/A;         Family History  Problem Relation Age of  Onset  . Hypertension Father   . Heart failure Father        paternal side of family   . Hypertension Sister   . Stroke Maternal Grandmother   . Hypertension Other        maternal side of family   . Hypertension Mother   . Diabetes Neg Hx   . Hyperlipidemia Neg Hx     Social History:  Divorced. Nephew lives with him--starting a job this week. He stayed with his mother past last surgery--reports nephew will be with him during the day and daughter will be there at nights. 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 He reports that he has never smoked. He has never used smokeless tobacco. He reports previous alcohol use--none X 1 year. He reports that he does not use drugs.        Allergies  Allergen Reactions  . Aspirin Hives, Itching and Rash  . Entresto [Sacubitril-Valsartan] Cough    Was on Entresto 07/2016-09/2016          Medications Prior to Admission  Medication Sig Dispense Refill  . acetaminophen (TYLENOL) 500 MG tablet Take 1,000 mg by mouth as needed for mild pain or headache.    Marland Kitchen amiodarone (PACERONE) 200 MG tablet Take 1 tablet (200 mg total) by mouth 2 (two) times daily. 60 tablet 11  . atorvastatin (LIPITOR) 40 MG tablet Take 1 tablet (40 mg total) by mouth daily. 30 tablet 11  . carvedilol (COREG) 3.125 MG tablet Take 1 tablet (3.125 mg total) by mouth 2 (two) times daily. 60 tablet 11  . cephALEXin (KEFLEX) 500 MG capsule Take 500 mg by mouth 4 (four) times daily.    . digoxin (LANOXIN) 0.25 MG tablet Take 1 tablet (0.25 mg total) by mouth daily. 30 tablet 11  . hydrALAZINE (APRESOLINE) 50 MG tablet Take 1 tablet (50 mg total) by mouth every 8 (eight) hours. 90 tablet 11  . isosorbide mononitrate (IMDUR) 60 MG 24 hr tablet Take 1 tablet (60 mg total) by mouth daily. 30 tablet 11  . losartan (COZAAR) 25 MG tablet Take 1 tablet (25 mg total) by mouth daily. (Patient taking differently: Take 25 mg by  mouth every evening. ) 30 tablet 11  . Potassium Chloride ER 20 MEQ TBCR Take 20 mEq by mouth daily. (Patient taking differently: Take 20 mEq by mouth daily as needed (cramping). ) 30 tablet 11  . sildenafil (VIAGRA) 100 MG tablet Take 1 tablet (100 mg total) by mouth daily as needed for erectile dysfunction. 5 tablet 3  . spironolactone (ALDACTONE) 25 MG tablet Take 0.5 tablets (12.5 mg total) by mouth daily. 15 tablet 11  . torsemide (DEMADEX) 20 MG tablet Take 40 mg (2 Tablets) in the AM and 20 mg (1 Tablet) in the PM (Patient taking differently: Take 20-40 mg by mouth See admin instructions. Take 40 mg (2 Tablets) in the AM and 20 mg (1 Tablet) in the PM) 90 tablet 11  . warfarin (COUMADIN) 7.5 MG tablet Take 1 tablet (7.5 mg total) by mouth daily at 6 PM. (Patient taking  differently: Take 7.5 mg by mouth See admin instructions. Take 1 tablet (7.5 mg) by mouth on Mondays, Wednesdays, & Fridays at 6pm Take 0.5 tablet (3.75 mg) by mouth Sundays, Tuesdays, Thursdays, Saturdays at 6pm.) 45 tablet 6  . oxyCODONE (ROXICODONE) 5 MG immediate release tablet Take 1-2 tablets (5-10 mg total) by mouth every 4 (four) hours as needed for moderate pain or severe pain. (Patient not taking: Reported on 07/23/2018) 45 tablet 0    Home: Home Living Family/patient expects to be discharged to:: Private residence Living Arrangements: Other relatives(nephew) Available Help at Discharge: Family, Available PRN/intermittently(nephew just got a job) Type of Home: House Home Access: Stairs to enter Secretary/administrator of Steps: flight Entrance Stairs-Rails: Can reach both Home Layout: One level Home Equipment: Crutches  Functional History: Prior Function Level of Independence: Independent Comments: Pt using crutches for the last week when injury occurred. Prior to this, independent. Does not work. Functional Status:  Mobility: Bed Mobility Overal bed mobility: Needs Assistance Bed Mobility: Supine to Sit,  Sit to Supine Supine to sit: +2 for physical assistance, Mod assist Sit to supine: +2 for physical assistance, Max assist General bed mobility comments: assist for L leg off bed, and to lift trunk to upright with pt helping too, assist to supine for legs and trunk, pt able to pull up in bed on his own Transfers Overall transfer level: Needs assistance Equipment used: None Transfers: Sit to/from Stand Sit to Stand: Min assist, +2 physical assistance, From elevated surface General transfer comment: much increased time, pt pushing from back of chair on R and footboard on L, unable to get crutches in place for him to remain standing, stood twice  ADL:  Cognition: Cognition Overall Cognitive Status: No family/caregiver present to determine baseline cognitive functioning Orientation Level: Oriented X4 Cognition Arousal/Alertness: Lethargic Behavior During Therapy: Flat affect Overall Cognitive Status: No family/caregiver present to determine baseline cognitive functioning General Comments: slow to respond at times, lots of increased time to attempt to stand and needing lots of encouragement, home info different from when here in Oct   Blood pressure (!) 129/91, pulse 79, temperature 97.6 F (36.4 C), temperature source Oral, resp. rate (!) 25, height 6\' 3"  (1.905 m), weight (!) 238.6 kg, SpO2 98 %. Physical Exam  Nursing note and vitals reviewed. Constitutional: He is oriented to person, place, and time. He appears well-developed. No distress. Nasal cannula in place.  Morbidly obese male. Alert and conversant. NAD  HENT:  Head: Normocephalic and atraumatic.  Eyes: EOM are normal. Right eye exhibits no discharge. Left eye exhibits no discharge.  Neck: Normal range of motion. Neck supple.  Cardiovascular: Normal rate and regular rhythm.  Respiratory: Effort normal. He has wheezes (occasional).  + Hubbell  GI: Soft. Bowel sounds are normal.  Musculoskeletal:        General: Tenderness  (Right knee) and edema present.     Comments: Right knee with ace wrap. 2+ edema BLE and bilateral hands.   Neurological: He is alert and oriented to person, place, and time.  Speech clear.  Motor: Bilateral upper extremities: 5/5 proximal and distal Left lower extremity: Hip flexion 4/5, knee extension, ankle dorsiflexion 4+/5 Right lower extremity: Hip flexion 2/5, knee dressed, ankle dorsiflexion 4/5 (some pain inhibition)  Skin: He is not diaphoretic.  See above Right knee with dressing C/D/I  Psychiatric: He has a normal mood and affect. His behavior is normal. Thought content normal.    LabResultsLast24Hours  Results for orders placed or performed during the hospital encounter of 07/23/18 (from the past 24 hour(s))  Protime-INR     Status: Abnormal   Collection Time: 07/28/18  3:21 AM  Result Value Ref Range   Prothrombin Time 28.6 (H) 11.4 - 15.2 seconds   INR 2.74   Basic metabolic panel     Status: Abnormal   Collection Time: 07/28/18  3:21 AM  Result Value Ref Range   Sodium 135 135 - 145 mmol/L   Potassium 3.5 3.5 - 5.1 mmol/L   Chloride 87 (L) 98 - 111 mmol/L   CO2 34 (H) 22 - 32 mmol/L   Glucose, Bld 145 (H) 70 - 99 mg/dL   BUN 55 (H) 6 - 20 mg/dL   Creatinine, Ser 1.613.19 (H) 0.61 - 1.24 mg/dL   Calcium 8.3 (L) 8.9 - 10.3 mg/dL   GFR calc non Af Amer 23 (L) >60 mL/min   GFR calc Af Amer 27 (L) >60 mL/min   Anion gap 14 5 - 15  Magnesium     Status: None   Collection Time: 07/28/18  3:21 AM  Result Value Ref Range   Magnesium 2.3 1.7 - 2.4 mg/dL  Digoxin level     Status: Abnormal   Collection Time: 07/28/18  3:21 AM  Result Value Ref Range   Digoxin Level 0.4 (L) 0.8 - 2.0 ng/mL      ImagingResults(Last48hours)  [place] Renal  Result Date: 07/26/2018 CLINICAL DATA:  Acute renal insufficiency. EXAM: RENAL / URINARY TRACT ULTRASOUND COMPLETE COMPARISON:  None. FINDINGS: Right Kidney: Renal measurements: 12.3 x 8 x 7.6 cm =  volume: 392.1 mL. Limited evaluation. Not well visualized. Left Kidney: Renal measurements: 11.5 x 7.3 x 9.5 cm = volume: 414.9 mL. Not well visualized. Limited evaluation. Bladder: Not visualized. IMPRESSION: 1. Severely limited study due to patient body habitus. No gross renal abnormalities noted on limited images. Electronically Signed   By: Gerome Samavid  Williams III M.D   On: 07/26/2018 14:57   Dg Chest Port 1 View  Result Date: 07/26/2018 CLINICAL DATA:  Acute renal insufficiency. EXAM: PORTABLE CHEST 1 VIEW COMPARISON:  July 25, 2018 FINDINGS: Marked enlargement of the cardiac silhouette, unchanged. No pneumothorax. No overt edema. No focal infiltrate. IMPRESSION: Marked enlargement the cardiac silhouette, stable. No overt edema or other acute abnormalities. Electronically Signed   By: Gerome Samavid  Williams III M.D   On: 07/26/2018 10:01     Assessment/Plan: Diagnosis: Debility Labs and images (see above) independently reviewed.  Records reviewed and summated above.  1. Does the need for close, 24 hr/day medical supervision in concert with the patient's rehab needs make it unreasonable for this patient to be served in a less intensive setting? Yes  2. Co-Morbidities requiring supervision/potential complications: Combined CHF (Monitor in accordance with increased physical activity and avoid UE resistance excercises), AKI (avoid nephrotoxic meds), acute on chronic hypoxic/hypercarbic respiratory failure (wean supplemental oxygen as tolerated),  morbid obesity (encourage weight loss), OSA--non compliant with CPAP, PAF, HTN (monitor and provide prns in accordance with increased physical exertion and pain), CKD, recent right quad tendon repair 6 weels 3. Due to bowel management, safety, skin/wound care, disease management, pain management and patient education, does the patient require 24 hr/day rehab nursing? Yes 4. Does the patient require coordinated care of a physician, rehab nurse, PT (1-2 hrs/day,  5 days/week) and OT (1-2 hrs/day, 5 days/week) to address physical and functional deficits in the context of the above medical diagnosis(es)? Yes Addressing deficits  in the following areas: balance, endurance, locomotion, strength, transferring, bathing, dressing, toileting and psychosocial support 5. Can the patient actively participate in an intensive therapy program of at least 3 hrs of therapy per day at least 5 days per week? Potentially 6. The potential for patient to make measurable gains while on inpatient rehab is excellent 7. Anticipated functional outcomes upon discharge from inpatient rehab are min assist  with PT, min assist and mod assist with OT, n/a with SLP. 8. Estimated rehab length of stay to reach the above functional goals is: 13-17 days. 9. Anticipated D/C setting: Home 10. Anticipated post D/C treatments: HH therapy and Home excercise program 11. Overall Rehab/Functional Prognosis: excellent and good  RECOMMENDATIONS: This patient's condition is appropriate for continued rehabilitative care in the following setting: CIR Patient has agreed to participate in recommended program. Yes Note that insurance prior authorization may be required for reimbursement for recommended care.  Comment: Rehab Admissions Coordinator to follow up.   I have personally performed a face to face diagnostic evaluation, including, but not limited to relevant history and physical exam findings, of this patient and developed relevant assessment and plan.  Additionally, I have reviewed and concur with the physician assistant's documentation above.   Maryla Morrow, MD, ABPMR Jacquelynn Cree, PA-C 07/28/2018        Revision History                             Routing History

## 2018-07-30 NOTE — Progress Notes (Signed)
PROGRESS NOTE    Mark Baker  ZOX:[id number] DOB: 1981/03/06 DOA: 07/23/2018 PCP: Sherren MochaShaw, Eva N, MD   Brief Narrative:  37 y.o. 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  Obesity with BMI greater than 60, paroxysmal atrial fibrillation was admitted to the hospital with increasing drainage from the right knee following quadricep tendon repair about 6 weeks ago.  Patient underwent excisional debridement of the right knee which he tolerated well but the following day he developed shortness of breath and was very drowsy.  Patient was offered CPAP yesterday night but he refused.  Medicine team consulted for hypoxic, hypercarbic respiratory failure requiring BiPAP.  Due to worsening renal function, nephrology was consulted.  Now patient is auto diuresing and renal function is improving and it has trending down to 2.12.     Assessment & Plan:   Principal Problem:   Acute respiratory failure with hypoxia and hypercapnia (HCC) Active Problems:   Sleep apnea   HTN (hypertension)   Hyperlipidemia   Chronic HFrEF (heart failure with reduced ejection fraction) (HCC)   CKD (chronic kidney disease), stage III (HCC)   Erectile dysfunction   Quadriceps tendon rupture, right, initial encounter   Infection of right knee (HCC)   AKI (acute kidney injury) (HCC)   Supplemental oxygen dependent   PAF (paroxysmal atrial fibrillation) (HCC)   Benign essential HTN   Acute respiratory failure with hypoxia and hypercapnia, greatly improved. Acute metabolic encephalopathy, better Acute on chronic combined systolic and congestive heart failure, ejection fraction 20%, class IV -He is auto diuresing well.  Echocardiogram 11/26/2017 showed ejection fraction 20-25%.  Repeat echocardiogram 12/16 showed ejection fraction 25% with diffuse hypokinesis. -Continue  BiPAP as needed especially while asleep- Rate 18, IPAP/EPAP 22/12. Need outpatient sleep study.  -Continue Coreg 3.125 milligrams twice daily, isosorbide mononitrate 60 mg daily.  Holding off on nephrotoxic drugs-Aldactone, losartan and Lasix.  Discharge recommendation.  - Restart his Torsemide 20mg  po BID tomorrow. Repeat lab work on 08/03/18- if renal function back to baseline - resume Losartan and Aldactone.  Cont BiPAP use when asleep.  Continue Coumadin with Goal INR 2.0-3.0. Foley removed 07/29/18  Acute kidney injury with concerning ATN -Improved greatly. Cr down to 2.12  Persistent atrial fibrillation - On amiodarone 200 mg twice daily, Coreg, digoxin, Coumadin. INR today 2.95  Essential hypertension -Continue current medications- except Losartan and Aldactone.   Peripheral neuropathy -Gabapentin renally dosed   DVT prophylaxis: Coumadin Code Status: Full code Family Communication: None at bedside Disposition Plan: Medically stable. Will Sign off. Medication Rec as above.  Please call with Questions. Spoke with Tresa EndoKelly from [facility], who will review the case and contact Ortho for discharge.   Procedures:   Quadricep tendon repair on 12/13  Subjective: Feels much better.  Sitting up in the chair on nasal cannula.  Tolerated BiPAP overnight.  Review of Systems Otherwise negative except as per HPI, including: General = no fevers, chills, dizziness, malaise, fatigue HEENT/EYES = negative for pain, redness, loss of vision, double vision, blurred vision, loss of hearing, sore throat, hoarseness, dysphagia Cardiovascular= negative for chest pain, palpitation, murmurs, lower extremity swelling Respiratory/lungs= negative for shortness of breath, cough, hemoptysis, wheezing, mucus production Gastrointestinal= negative for nausea, vomiting,, abdominal pain, melena, hematemesis Genitourinary= negative for Dysuria, Hematuria, Change in Urinary Frequency MSK = Negative for  arthralgia, myalgias, Back Pain, Joint swelling  Neurology= Negative for headache, seizures, numbness, tingling  Psychiatry= Negative for anxiety, depression, suicidal and homocidal ideation Allergy/Immunology= Medication/Food allergy as listed  Skin= Negative for Rash, lesions, ulcers, itching   Objective: Vitals:   07/30/18 0144 07/30/18 0432 07/30/18 0739 07/30/18 1000  BP:  (!) 141/91 (!) 125/95 (!) 151/86  Pulse: 74 76 74 78  Resp: (!) 23 19 (!) 23 16  Temp:  98.1 F (36.7 C) 97.9 F (36.6 C)   TempSrc:  Oral Oral   SpO2: 97% 97% 95% 98%  Weight:      Height:        Intake/Output Summary (Last 24 hours) at 07/30/2018 1108 Last data filed at 07/30/2018 1011 Gross per 24 hour  Intake 1165.1 ml  Output 2375 ml  Net -1209.9 ml   Filed Weights   07/23/18 1231 07/23/18 1743  Weight: (!) 216.8 kg (!) 238.6 kg    Examination:  Constitutional: NAD, calm, comfortable, morbidly obese on 2 L nasal cannula Eyes: PERRL, lids and conjunctivae normal ENMT: Mucous membranes are moist. Posterior pharynx clear of any exudate or lesions.Normal dentition.  Neck: normal, supple, no masses, no thyromegaly Respiratory: clear to auscultation bilaterally, no wheezing, no crackles. Normal respiratory effort. No accessory muscle use.  Cardiovascular: Regular rate and rhythm, no murmurs / rubs / gallops. No extremity edema. 2+ pedal pulses. No carotid bruits.  Abdomen: no tenderness, no masses palpated. No hepatosplenomegaly. Bowel sounds positive.  Musculoskeletal: no clubbing / cyanosis. No joint deformity upper and lower extremities. Good ROM, no contractures. Normal muscle tone.  Skin: no rashes, lesions, ulcers. No induration Neurologic: CN 2-12 grossly intact. Sensation intact, DTR normal. Strength 4/5 in all 4.  Psychiatric: Normal judgment and insight. Alert and oriented x 3. Normal mood.    Data Reviewed:   CBC: Recent Labs  Lab 07/23/18 1329 07/29/18 0231  WBC 6.4 8.5  HGB  11.7* 10.6*  HCT 41.4 37.3*  MCV 92.2 91.4  PLT 391 313   Basic Metabolic Panel: Recent Labs  Lab 07/26/18 0214 07/27/18 0308 07/28/18 0321 07/29/18 0231 07/30/18 0235  NA 136 136 135 136 137  K 4.0 3.9 3.5 3.8 3.8  CL 86* 87* 87* 89* 92*  CO2 33* 35* 34* 35* 37*  GLUCOSE 107* 117* 145* 100* 120*  BUN 42* 54* 55* 50* 42*  CREATININE 3.79* 3.86* 3.19* 2.66* 2.12*  CALCIUM 8.4* 8.4* 8.3* 8.2* 8.5*  MG 1.9 2.1 2.3 2.4 2.3   GFR: Estimated Creatinine Clearance: 97.6 mL/min (A) (by C-G formula based on SCr of 2.12 mg/dL (H)). Liver Function Tests: No results for input(s): AST, ALT, ALKPHOS, BILITOT, PROT, ALBUMIN in the last 168 hours. No results for input(s): LIPASE, AMYLASE in the last 168 hours. No results for input(s): AMMONIA in the last 168 hours. Coagulation Profile: Recent Labs  Lab 07/26/18 0214 07/27/18 0308 07/28/18 0321 07/29/18 0231 07/30/18 0235  INR 2.45 2.45 2.74 2.65 2.95   Cardiac Enzymes: No results for input(s): CKTOTAL, CKMB, CKMBINDEX, TROPONINI in the last 168 hours. BNP (last 3 results) No results for input(s): PROBNP in the last 8760 hours. HbA1C: No results for input(s): HGBA1C in the last 72 hours. CBG: No results for input(s): GLUCAP in the last 168 hours. Lipid Profile: No results for input(s): CHOL, HDL, LDLCALC, TRIG, CHOLHDL, LDLDIRECT in the last 72 hours. Thyroid Function Tests: No results for input(s): TSH, T4TOTAL, FREET4, T3FREE, THYROIDAB in the last 72 hours. Anemia Panel: No results for input(s): VITAMINB12, FOLATE, FERRITIN, TIBC, IRON, RETICCTPCT in the last 72 hours. Sepsis Labs:  No results for input(s): PROCALCITON, LATICACIDVEN in the last 168 hours.  Recent Results (from the past 240 hour(s))  Body fluid culture     Status: None   Collection Time: 07/23/18  3:26 PM  Result Value Ref Range Status   Specimen Description FLUID JOINT OTHER  Final   Special Requests NONE  Final   Gram Stain   Final    ABUNDANT WBC  PRESENT, PREDOMINANTLY PMN FEW GRAM POSITIVE COCCI Performed at [facility], [address] [place], [place] [address]    Culture FEW STAPHYLOCOCCUS AUREUS  Final   Report Status 07/27/2018 FINAL  Final   Organism ID, Bacteria STAPHYLOCOCCUS AUREUS  Final      Susceptibility   Staphylococcus aureus - MIC*    CIPROFLOXACIN <=0.5 SENSITIVE Sensitive     ERYTHROMYCIN <=0.25 SENSITIVE Sensitive     GENTAMICIN <=0.5 SENSITIVE Sensitive     OXACILLIN <=0.25 SENSITIVE Sensitive     TETRACYCLINE <=1 SENSITIVE Sensitive     VANCOMYCIN <=0.5 SENSITIVE Sensitive     TRIMETH/SULFA <=10 SENSITIVE Sensitive     CLINDAMYCIN <=0.25 SENSITIVE Sensitive     RIFAMPIN <=0.5 SENSITIVE Sensitive     Inducible Clindamycin NEGATIVE Sensitive     * FEW STAPHYLOCOCCUS AUREUS  Culture, Urine     Status: None   Collection Time: 07/25/18  5:08 PM  Result Value Ref Range Status   Specimen Description URINE, CATHETERIZED  Final   Special Requests NONE  Final   Culture   Final    NO GROWTH Performed at [facility], [address] [place], [place] [address]    Report Status 07/26/2018 FINAL  Final         Radiology Studies: No results found.      Scheduled Meds: . amiodarone  200 mg Oral BID  . atorvastatin  40 mg Oral Daily  . carvedilol  6.25 mg Oral BID  . digoxin  0.25 mg Oral Daily  . docusate sodium  100 mg Oral BID  . hydrALAZINE  50 mg Oral Q8H  . isosorbide mononitrate  60 mg Oral Daily  . magnesium oxide  800 mg Oral Once  . mouth rinse  15 mL Mouth Rinse BID  . torsemide  20 mg Oral Daily  . Warfarin - Pharmacist Dosing Inpatient   Does not apply q1800   Continuous Infusions: . sodium chloride 250 mL (07/30/18 0449)  .  ceFAZolin (ANCEF) IV 2 g (07/30/18 0450)     LOS: 6 days   Time spent=30 mins    Robet Crutchfield Joline Maxcy, MD [facility] Pager [phone]   If 7PM-7AM, please contact night-coverage [email or web address] Password [email or web address] 07/30/2018, 11:08 AM

## 2018-07-30 NOTE — Progress Notes (Signed)
Patient discharged per MD order to CIR. Report called to Marylene Land RN, patient transferred with all personal belongings to room 7.

## 2018-07-30 NOTE — Progress Notes (Signed)
Inpatient Rehabilitation-Admissions Coordinator   [facility] has received insurance authorization for CIR and has bed available today. Awaiting medical clearance from Dr. Aundria Rud for admit.   Please call if questions.   Nanine Means, OTR/L  Rehab Admissions Coordinator  [phone] 07/30/2018 1:37 PM

## 2018-07-30 NOTE — Plan of Care (Signed)

## 2018-07-30 NOTE — Progress Notes (Signed)
Pt. Refused bipap. 

## 2018-07-31 ENCOUNTER — Inpatient Hospital Stay (HOSPITAL_COMMUNITY): Payer: Managed Care, Other (non HMO) | Admitting: Occupational Therapy

## 2018-07-31 ENCOUNTER — Inpatient Hospital Stay (HOSPITAL_COMMUNITY): Payer: Managed Care, Other (non HMO) | Admitting: Physical Therapy

## 2018-07-31 ENCOUNTER — Telehealth: Payer: Self-pay | Admitting: Occupational Therapy

## 2018-07-31 ENCOUNTER — Inpatient Hospital Stay (HOSPITAL_COMMUNITY): Payer: Managed Care, Other (non HMO)

## 2018-07-31 DIAGNOSIS — N179 Acute kidney failure, unspecified: Secondary | ICD-10-CM

## 2018-07-31 DIAGNOSIS — I5043 Acute on chronic combined systolic (congestive) and diastolic (congestive) heart failure: Secondary | ICD-10-CM

## 2018-07-31 DIAGNOSIS — E669 Obesity, unspecified: Secondary | ICD-10-CM

## 2018-07-31 DIAGNOSIS — I10 Essential (primary) hypertension: Secondary | ICD-10-CM

## 2018-07-31 DIAGNOSIS — I509 Heart failure, unspecified: Secondary | ICD-10-CM

## 2018-07-31 DIAGNOSIS — J398 Other specified diseases of upper respiratory tract: Secondary | ICD-10-CM

## 2018-07-31 DIAGNOSIS — J988 Other specified respiratory disorders: Secondary | ICD-10-CM

## 2018-07-31 DIAGNOSIS — D62 Acute posthemorrhagic anemia: Secondary | ICD-10-CM

## 2018-07-31 LAB — CBC WITH DIFFERENTIAL/PLATELET
Abs Immature Granulocytes: 0.02 10*3/uL (ref 0.00–0.07)
Basophils Absolute: 0 10*3/uL (ref 0.0–0.1)
Basophils Relative: 0 %
Eosinophils Absolute: 0.2 10*3/uL (ref 0.0–0.5)
Eosinophils Relative: 3 %
HCT: 38.8 % — ABNORMAL LOW (ref 39.0–52.0)
Hemoglobin: 10.8 g/dL — ABNORMAL LOW (ref 13.0–17.0)
Immature Granulocytes: 0 %
Lymphocytes Relative: 8 %
Lymphs Abs: 0.6 10*3/uL — ABNORMAL LOW (ref 0.7–4.0)
MCH: 25.5 pg — ABNORMAL LOW (ref 26.0–34.0)
MCHC: 27.8 g/dL — ABNORMAL LOW (ref 30.0–36.0)
MCV: 91.7 fL (ref 80.0–100.0)
Monocytes Absolute: 1.2 10*3/uL — ABNORMAL HIGH (ref 0.1–1.0)
Monocytes Relative: 16 %
Neutro Abs: 5.6 10*3/uL (ref 1.7–7.7)
Neutrophils Relative %: 73 %
Platelets: 306 10*3/uL (ref 150–400)
RBC: 4.23 MIL/uL (ref 4.22–5.81)
RDW: 14.2 % (ref 11.5–15.5)
WBC: 7.6 10*3/uL (ref 4.0–10.5)
nRBC: 0 % (ref 0.0–0.2)

## 2018-07-31 LAB — COMPREHENSIVE METABOLIC PANEL
ALT: 8 U/L (ref 0–44)
AST: 34 U/L (ref 15–41)
Albumin: 2.5 g/dL — ABNORMAL LOW (ref 3.5–5.0)
Alkaline Phosphatase: 62 U/L (ref 38–126)
Anion gap: 8 (ref 5–15)
BUN: 33 mg/dL — ABNORMAL HIGH (ref 6–20)
CO2: 40 mmol/L — AB (ref 22–32)
Calcium: 8.7 mg/dL — ABNORMAL LOW (ref 8.9–10.3)
Chloride: 92 mmol/L — ABNORMAL LOW (ref 98–111)
Creatinine, Ser: 2.01 mg/dL — ABNORMAL HIGH (ref 0.61–1.24)
GFR calc Af Amer: 47 mL/min — ABNORMAL LOW (ref >60)
GFR calc non Af Amer: 41 mL/min — ABNORMAL LOW (ref >60)
Glucose, Bld: 101 mg/dL — ABNORMAL HIGH (ref 70–99)
Potassium: 4.1 mmol/L (ref 3.5–5.1)
SODIUM: 140 mmol/L (ref 135–145)
Total Bilirubin: 1.5 mg/dL — ABNORMAL HIGH (ref 0.3–1.2)
Total Protein: 6.8 g/dL (ref 6.5–8.1)

## 2018-07-31 LAB — PROTIME-INR
INR: 3.45
Prothrombin Time: 34.2 seconds — ABNORMAL HIGH (ref 11.4–15.2)

## 2018-07-31 MED ORDER — ALBUTEROL SULFATE (2.5 MG/3ML) 0.083% IN NEBU
2.5000 mg | INHALATION_SOLUTION | RESPIRATORY_TRACT | Status: DC | PRN
  Administered 2018-07-31: 2.5 mg via RESPIRATORY_TRACT

## 2018-07-31 MED ORDER — ALBUTEROL SULFATE (2.5 MG/3ML) 0.083% IN NEBU
INHALATION_SOLUTION | RESPIRATORY_TRACT | Status: AC
  Filled 2018-07-31: qty 3

## 2018-07-31 NOTE — Progress Notes (Signed)
Occupational Therapy Assessment and Plan  Patient Details  Name: Mark Baker MRN: [id number] Date of Birth: 03-30-1981  OT Diagnosis: muscle weakness (generalized) and pain in joint Rehab Potential: Rehab Potential (ACUTE ONLY): Good ELOS: 5-7 days   Today's Date: 07/31/2018 OT Individual Time: 8101-7510 OT Individual Time Calculation (min): 60 min     Problem List:  Patient Active Problem List   Diagnosis Date Noted  . Acute blood loss anemia   . CHF (congestive heart failure) ([place])   . Congestion of upper respiratory tract   . Debility 07/30/2018  . AKI (acute kidney injury) ([place])   . Supplemental oxygen dependent   . PAF (paroxysmal atrial fibrillation) ([place])   . Benign essential HTN   . Acute respiratory failure with hypoxia and hypercapnia ([place]) 07/25/2018  . Infection of right knee ([place]) 07/23/2018  . Rupture of right quadriceps muscle 06/10/2018  . Quadriceps tendon rupture, right, initial encounter 06/10/2018  . Vitamin D deficiency 12/10/2017  . Volume overload 11/25/2017  . Syncope 01/01/2017  . Erectile dysfunction 12/25/2016  . Encounter for therapeutic drug monitoring 11/19/2016  . Paroxysmal atrial fibrillation (HCC)   . Chronic HFrEF (heart failure with reduced ejection fraction) ([place]) 07/05/2016  . Morbid obesity with body mass index (BMI) of 60.0 to 69.9 in adult [facility]) 07/05/2016  . CKD (chronic kidney disease), stage III ([place]) 07/05/2016  . Hyperlipidemia 11/25/2012  . Prolonged QT interval 11/24/2012  . Healthcare maintenance 09/03/2012  . HTN (hypertension) 02/19/2012  . Sleep apnea 02/10/2012  . Morbid obesity ([place]) 02/10/2012    Past Medical History:  Past Medical History:  Diagnosis Date  . Childhood asthma    no exacerbation since age 6   . Chronic combined systolic and diastolic CHF (congestive heart failure) ([place])    a) ECHO (11/2012): EF 40-45%, RV fx difficult to see, nl size b) ECHO (05/2014): EF 20-25%, grade 2 DD, RV mildly dilated  and sys fx mod reduced  . Hypertension   . Migraine    "down to maybe 2/year now; used to have them q other week" (11/26/2017)  . NICM (nonischemic cardiomyopathy) ([place])    a) (11/2012): normal coronaries  . OSA on CPAP    wears CPAP  . Postoperative wound infection    right knee  . Wears glasses    Past Surgical History:  Past Surgical History:  Procedure Laterality Date  . CARDIOVERSION N/A 11/07/2016   Procedure: CARDIOVERSION;  Surgeon: Jolaine Artist, MD;  Location: [place];  Service: Cardiovascular;  Laterality: N/A;  Will need extra help positioning patient  . INCISION AND DRAINAGE OF WOUND Right 07/23/2018   Procedure: Right knee  incisional irrigation and debridement;  Surgeon: Nicholes Stairs, MD;  Location: [place];  Service: Orthopedics;  Laterality: Right;  . KNEE ARTHROSCOPY Right 2003   ANTERIOR CRUCIATE LIGAMENT REPAIR   . LEFT HEART CATHETERIZATION WITH CORONARY ANGIOGRAM N/A 11/24/2012   Procedure: LEFT HEART CATHETERIZATION WITH CORONARY ANGIOGRAM;  Surgeon: Peter M Martinique, MD;  Location: [facility] CATH LAB;  Service: Cardiovascular;  Laterality: N/A;  . QUADRICEPS TENDON REPAIR Right 06/10/2018   Procedure: REPAIR QUADRICEP TENDON;  Surgeon: Nicholes Stairs, MD;  Location: [place];  Service: Orthopedics;  Laterality: Right;  . RIGHT/LEFT HEART CATH AND CORONARY ANGIOGRAPHY N/A 03/18/2017   Procedure: Right/Left Heart Cath and Coronary Angiography;  Surgeon: Jolaine Artist, MD;  Location: [place] CV LAB;  Service: Cardiovascular;  Laterality: N/A;  . TEE WITHOUT CARDIOVERSION N/A 11/07/2016  Procedure: TRANSESOPHAGEAL ECHOCARDIOGRAM (TEE);  Surgeon: Jolaine Artist, MD;  Location: [facility] OR;  Service: Cardiovascular;  Laterality: N/A;    Assessment & Plan Clinical Impression: [name] is a 37 year old male with history of morbid obesity, OSA--noncompliant with CPAP, PAF, hypertension, CKD, CM with chronic combined CHF, recent right quad tendon repair 6 weeks  prior to admission developed purulent drainage from incision due to postop infection. He was admitted on 07/24/2018 for I&D right knee by Dr. Stann Mainland. Postop course complicated by acute on chronic hypoxic/hypercarbic respiratory failure with somnolence requiring BiPAP, A. fib, AKI with rise in serum creatinine to 3.86, hypotension as well as fluid overload due to cardiorenal syndrome.   2D echo done showing EF of 25 to 30%--difficult study due to body habitus. Dr. Pamella Pert for input and felt that patient with ATN due to hypotension and metabolic alkalosis was likely compensatory to respiratory acidosis. Urine output is improving and patient without indications for hemodialysis at this time. Somnolence has resolved and patient has been refusing BiPAP. Has been transitioned from IV heparin to warfarin. Therapy evaluations done revealing debility and CIR was recommended due to functional deficitsPatient transferred to CIR on 07/30/2018 .    Patient currently requires supervision- Min A for functional transfers, grooming and UB bathing/dressing. Max A for LB bathing/dressing with basic self-care skills secondary to muscle weakness, decreased cardiorespiratoy endurance and decreased standing balance, decreased postural control and decreased balance strategies.  Prior to knee surgery in September, pt was independent with ADLs. Since September, he required min A for ADLs and using crutches for mobility.   Patient will benefit from skilled intervention to decrease level of assist with basic self-care skills and increase independence with basic self-care skills prior to discharge home with care partner.  Anticipate patient will require  Supervision during ADLs and mobility and follow up home health.  OT - End of Session Activity Tolerance: Tolerates 10 - 20 min activity with multiple rests Endurance Deficit: Yes Endurance Deficit Description: Requires frequent rest breaks throughout ADL  session OT Assessment Rehab Potential (ACUTE ONLY): Good OT Barriers to Discharge: Home environment access/layout;Decreased caregiver support OT Barriers to Discharge Comments: Bedroom and full bathroom on second level of home. Intermittent assist available at d/c OT Patient demonstrates impairments in the following area(s): Balance;Safety;Endurance OT Basic ADL's Functional Problem(s): Grooming;Bathing;Dressing;Toileting OT Transfers Functional Problem(s): Toilet OT Additional Impairment(s): None OT Plan OT Intensity: Minimum of 1-2 x/day, 45 to 90 minutes OT Frequency: 5 out of 7 days OT Duration/Estimated Length of Stay: 5-7 days OT Treatment/Interventions: Balance/vestibular training;Community reintegration;Disease mangement/prevention;Patient/family education;Self Care/advanced ADL retraining;Therapeutic Exercise;UE/LE Coordination activities;UE/LE Strength taining/ROM;Therapeutic Activities;Psychosocial support;Pain management;Functional mobility training;DME/adaptive equipment instruction;Discharge planning OT Self Feeding Anticipated Outcome(s): Indep OT Basic Self-Care Anticipated Outcome(s): Supervision OT Toileting Anticipated Outcome(s): Supervision OT Bathroom Transfers Anticipated Outcome(s): Supervision OT Recommendation Patient destination: Home Follow Up Recommendations: Home health OT Equipment Recommended: To be determined Equipment Details: Pt has BSC and RW   Skilled Therapeutic Intervention PT seen for OT eval and ADL bathing/dressing session. Pt sitting on BSC in bathroom upon arrival, finished voiding and agreeable to tx session, denying pain. He stood with use of crutches and steadying assist to ambulate out of bathroom to recliner.  He completed bathing/dressing routine from chair at sink. Set-up assist for UB bathing/dressing. Max A for LB bathing/dressing due to limited LE ROM and body habitus. Pt will benefit from education and use of AE for LB self care tasks.  He stood at sink to complete oral hygiene  task, required seated rest break following one grooming task. Pt transitioned back to recliner at end of session, left seated with all needs in reach and set-up with breakfast tray. Education provided throughout session regarding role of OT, POC, OT goals, DME, and d/c planning.   OT Evaluation Precautions/Restrictions  Precautions Precautions: Fall Restrictions Weight Bearing Restrictions: No RLE Weight Bearing: Weight bearing as tolerated General Chart Reviewed: Yes Home Living/Prior Functioning Home Living Family/patient expects to be discharged to:: Private residence Living Arrangements: Other relatives, Children Available Help at Discharge: Family, Available PRN/intermittently Type of Home: House Home Access: Stairs to enter Technical brewer of Steps: 4 Home Layout: 1/2 bath on main level, Two level Alternate Level Stairs-Number of Steps: Has been sleeping on couch on 1st floor as unable to make it up full fight of stairs to bedroom Bathroom Accessibility: No Additional Comments: 1/2 bathroom on main level not accessible   Lives With: Family(71 y.o. nephew and 77 y.o. daughter) IADL History Homemaking Responsibilities: Yes Current License: Yes Mode of Transportation: Car Type of Occupation: Has not worked since September. Worked for [employer] Prior Function Level of Independence: Independent with basic ADLs, Independent with homemaking with ambulation, Independent with gait, Requires assistive device for independence, Independent with transfers(Was independent prior to first knee surgery in September. Has been using crutches and required assist with ADLs since September) Vision Baseline Vision/History: Wears glasses Wears Glasses: At all times Patient Visual Report: No change from baseline Vision Assessment?: No apparent visual deficits Perception  Perception: Within Functional Limits Praxis Praxis:  Intact Cognition Overall Cognitive Status: Within Functional Limits for tasks assessed Arousal/Alertness: Awake/alert Orientation Level: Person;Place;Situation Person: Oriented Place: Oriented Situation: Oriented Year: 2019 Month: December Day of Week: Correct Memory: Appears intact Immediate Memory Recall: Sock;Blue;Bed Memory Recall: Sock;Blue;Bed Memory Recall Sock: Without Cue Memory Recall Blue: Without Cue Memory Recall Bed: Without Cue Awareness: Appears intact Problem Solving: Appears intact Safety/Judgment: Appears intact Sensation Sensation Light Touch: Appears Intact Coordination Gross Motor Movements are Fluid and Coordinated: No Fine Motor Movements are Fluid and Coordinated: Yes Coordination and Movement Description: Impaired due to generalized weakness and debility Motor  Motor Motor: Within Functional Limits;Other (comment) Motor - Skilled Clinical Observations: Limited by generalized weakness and debility Trunk/Postural Assessment  Cervical Assessment Cervical Assessment: Within Functional Limits Thoracic Assessment Thoracic Assessment: Within Functional Limits Lumbar Assessment Lumbar Assessment: Within Functional Limits Postural Control Postural Control: Within Functional Limits  Balance Balance Balance Assessed: Yes Static Sitting Balance Static Sitting - Balance Support: Feet supported;No upper extremity supported Static Sitting - Level of Assistance: 7: Independent Dynamic Sitting Balance Dynamic Sitting - Balance Support: During functional activity;Right upper extremity supported;Left upper extremity supported Dynamic Sitting - Level of Assistance: 5: Stand by assistance Sitting balance - Comments: Sitting on BSC completing lateral leans Static Standing Balance Static Standing - Balance Support: During functional activity;Right upper extremity supported;Left upper extremity supported Static Standing - Level of Assistance: 5: Stand by  assistance;4: Min assist Static Standing - Comment/# of Minutes: Standing at sink, crutch for support Dynamic Standing Balance Dynamic Standing - Balance Support: During functional activity;Right upper extremity supported;Left upper extremity supported Dynamic Standing - Level of Assistance: 5: Stand by assistance;4: Min assist Dynamic Standing - Comments: Standing with use of crutches during LB bathing/dressing tasks Extremity/Trunk Assessment RUE Assessment RUE Assessment: Within Functional Limits LUE Assessment LUE Assessment: Within Functional Limits     Refer to Care Plan for Long Term Goals  Recommendations for other services: None  Discharge Criteria: Patient will be discharged from OT if patient refuses treatment 3 consecutive times without medical reason, if treatment goals not met, if there is a change in medical status, if patient makes no progress towards goals or if patient is discharged from hospital.  The above assessment, treatment plan, treatment alternatives and goals were discussed and mutually agreed upon: by patient  Trebor Galdamez L 07/31/2018, 12:42 PM

## 2018-07-31 NOTE — Care Management Note (Signed)
Inpatient Rehabilitation Center Individual Statement of Services  Patient Name:  Mark Baker  Date:  07/31/2018  Welcome to the Inpatient Rehabilitation Center.  Our goal is to provide you with an individualized program based on your diagnosis and situation, designed to meet your specific needs.  With this comprehensive rehabilitation program, you will be expected to participate in at least 3 hours of rehabilitation therapies Monday-Friday, with modified therapy programming on the weekends.  Your rehabilitation program will include the following services:  Physical Therapy (PT), Occupational Therapy (OT), 24 hour per day rehabilitation nursing, Neuropsychology, Case Management (Social Worker), Rehabilitation Medicine, Nutrition Services and Pharmacy Services  Weekly team conferences will be held on Wednesday to discuss your progress.  Your Social Worker will talk with you frequently to get your input and to update you on team discussions.  Team conferences with you and your family in attendance may also be held.  Expected length of stay: 3-5 days  Overall anticipated outcome: mod/i level  Depending on your progress and recovery, your program may change. Your Social Worker will coordinate services and will keep you informed of any changes. Your Social Worker's name and contact numbers are listed  below.  The following services may also be recommended but are not provided by the Inpatient Rehabilitation Center:   Driving Evaluations  Home Health Rehabiltiation Services  Outpatient Rehabilitation Services  Vocational Rehabilitation   Arrangements will be made to provide these services after discharge if needed.  Arrangements include referral to agencies that provide these services.  Your insurance has been verified to be:  [place] Your primary doctor is:  Norberto Sorenson  Pertinent information will be shared with your doctor and your insurance company.  Social Worker:  Dossie Der, SW  [phone] or [phone]  Information discussed with and copy given to patient by: Lucy Chris, 07/31/2018, 9:51 AM

## 2018-07-31 NOTE — Progress Notes (Signed)
Inpatient Rehabilitation  Patient information reviewed and entered into eRehab system by Raidon Swanner M. Cecilia Nishikawa, M.A., CCC/SLP, PPS Coordinator.  Information including medical coding, functional ability and quality indicators will be reviewed and updated through discharge.    

## 2018-07-31 NOTE — Evaluation (Signed)
Physical Therapy Assessment and Plan  Patient Details  Name: Mark Baker MRN: [id number] Date of Birth: [birth year]  PT Diagnosis: Abnormality of gait and Difficulty walking Rehab Potential: Good ELOS: 3-5 days   Today's Date: 07/31/2018 PT Individual Time: 1105-1130 PT Individual Time Calculation (min): 25 min    Problem List:  Patient Active Problem List   Diagnosis Date Noted  . Acute blood loss anemia   . CHF (congestive heart failure) ([place])   . Congestion of upper respiratory tract   . Debility 07/30/2018  . AKI (acute kidney injury) ([place])   . Supplemental oxygen dependent   . PAF (paroxysmal atrial fibrillation) ([place])   . Benign essential HTN   . Acute respiratory failure with hypoxia and hypercapnia ([place]) 07/25/2018  . Infection of right knee ([place]) 07/23/2018  . Rupture of right quadriceps muscle 06/10/2018  . Quadriceps tendon rupture, right, initial encounter 06/10/2018  . Vitamin D deficiency 12/10/2017  . Volume overload 11/25/2017  . Syncope 01/01/2017  . Erectile dysfunction 12/25/2016  . Encounter for therapeutic drug monitoring 11/19/2016  . Paroxysmal atrial fibrillation (HCC)   . Chronic HFrEF (heart failure with reduced ejection fraction) ([place]) 07/05/2016  . Morbid obesity with body mass index (BMI) of 60.0 to 69.9 in adult [facility]) 07/05/2016  . CKD (chronic kidney disease), stage III ([place]) 07/05/2016  . Hyperlipidemia 11/25/2012  . Prolonged QT interval 11/24/2012  . Healthcare maintenance 09/03/2012  . HTN (hypertension) 02/19/2012  . Sleep apnea 02/10/2012  . Morbid obesity ([place]) 02/10/2012    Past Medical History:  Past Medical History:  Diagnosis Date  . Childhood asthma    no exacerbation since age 45   . Chronic combined systolic and diastolic CHF (congestive heart failure) ([place])    a) ECHO (11/2012): EF 40-45%, RV fx difficult to see, nl size b) ECHO (05/2014): EF 20-25%, grade 2 DD, RV mildly dilated and sys fx mod reduced  . Hypertension    . Migraine    "down to maybe 2/year now; used to have them q other week" (11/26/2017)  . NICM (nonischemic cardiomyopathy) ([place])    a) (11/2012): normal coronaries  . OSA on CPAP    wears CPAP  . Postoperative wound infection    right knee  . Wears glasses    Past Surgical History:  Past Surgical History:  Procedure Laterality Date  . CARDIOVERSION N/A 11/07/2016   Procedure: CARDIOVERSION;  Surgeon: Jolaine Artist, MD;  Location: [place];  Service: Cardiovascular;  Laterality: N/A;  Will need extra help positioning patient  . INCISION AND DRAINAGE OF WOUND Right 07/23/2018   Procedure: Right knee  incisional irrigation and debridement;  Surgeon: Nicholes Stairs, MD;  Location: [place];  Service: Orthopedics;  Laterality: Right;  . KNEE ARTHROSCOPY Right 2003   ANTERIOR CRUCIATE LIGAMENT REPAIR   . LEFT HEART CATHETERIZATION WITH CORONARY ANGIOGRAM N/A 11/24/2012   Procedure: LEFT HEART CATHETERIZATION WITH CORONARY ANGIOGRAM;  Surgeon: Peter M Martinique, MD;  Location: [facility] CATH LAB;  Service: Cardiovascular;  Laterality: N/A;  . QUADRICEPS TENDON REPAIR Right 06/10/2018   Procedure: REPAIR QUADRICEP TENDON;  Surgeon: Nicholes Stairs, MD;  Location: [place];  Service: Orthopedics;  Laterality: Right;  . RIGHT/LEFT HEART CATH AND CORONARY ANGIOGRAPHY N/A 03/18/2017   Procedure: Right/Left Heart Cath and Coronary Angiography;  Surgeon: Jolaine Artist, MD;  Location: [place] CV LAB;  Service: Cardiovascular;  Laterality: N/A;  . TEE WITHOUT CARDIOVERSION N/A 11/07/2016   Procedure: TRANSESOPHAGEAL ECHOCARDIOGRAM (TEE);  Surgeon: Jolaine Artist, MD;  Location: [facility] OR;  Service: Cardiovascular;  Laterality: N/A;    Assessment & Plan Clinical Impression: Danish Ruffins a 37 y.o.malewith history of morbid obesity, OSA--non compliant with CPAP, PAF, HTN, CKD, CM with chronic combined CHF, recent right quad tendon repair 6 weelsPTA who developed purulent drainage due to post op  infection and was admitted on 07/24/18 for I and D right knee by Dr. Stann Mainland.History taken from chart review and patient. Post op course complicated by acute on chronic hypoxic/hypercarbic respiratory failure with somnolence requiring BIPAP, A fib, AKI with rise in SCr to 3.86, hypotension as well as fluid overload due to cardiorenal syndrome. 2 D echo done revealing EF 25-30% --difficult study due to body habitus. Dr. Moshe Cipro consulted yesterday for input and felt that patient with ATN due to hypotension and metabolic alkalosislikely compensatory due to respiratory acidosis. UOP improving and no indications for dialysis at this time. Somnolence resolving and he was cleared for therapy evaluation today. Patient noted be debilitated and CIR due to functional decline.    Patient currently requires min with mobility secondary to muscle weakness, decreased cardiorespiratoy endurance and decreased standing balance, decreased postural control and decreased balance strategies.  Prior to hospitalization, patient was supervision with mobility and lived with [place] y.o. nephew and 24 y.o. daughter) in a House home.  Home access is 4Stairs to enter.  Patient will benefit from skilled PT intervention to maximize safe functional mobility, minimize fall risk and decrease caregiver burden for planned discharge home with 24 hour supervision.  Anticipate patient will benefit from follow up [place] at discharge.  PT - End of Session Activity Tolerance: Tolerates 10 - 20 min activity with multiple rests Endurance Deficit: Yes Endurance Deficit Description: Requires frequent rest breaks with mobility PT Assessment Rehab Potential (ACUTE/IP ONLY): Good PT Barriers to Discharge: Home environment access/layout PT Barriers to Discharge Comments: 5 steps to enter home PT Patient demonstrates impairments in the following area(s): Endurance;Safety PT Transfers Functional Problem(s): Bed Mobility;Bed to  Chair;Car;Furniture PT Locomotion Functional Problem(s): Ambulation;Stairs PT Plan PT Intensity: Minimum of 1-2 x/day ,45 to 90 minutes PT Frequency: 5 out of 7 days PT Duration Estimated Length of Stay: 3-5 days PT Treatment/Interventions: Ambulation/gait training;Community reintegration;DME/adaptive equipment instruction;Stair training;UE/LE Strength taining/ROM;UE/LE Coordination activities;Therapeutic Activities;Discharge planning;Balance/vestibular training;Disease management/prevention;Functional mobility training;Patient/family education;Therapeutic Exercise PT Transfers Anticipated Outcome(s): Supervision PT Locomotion Anticipated Outcome(s): Supervision PT Recommendation Follow Up Recommendations: Home health PT Patient destination: Home Equipment Recommended: None recommended by PT Equipment Details: has crutches  Skilled Therapeutic Intervention Pt received seated in recliner, denies pain but reports very fatigued from earlier session and not sleeping well last night. Assessed mobility as below with min guard overall using crutches. Educated pt on rehab process, schedule, estimated LOS and goals at S overall; pt agreeable. Missed 35 min of session d/t fatigue. Remained in recliner at end of session, all needs in reach.   PT Evaluation Precautions/Restrictions Precautions Precautions: Fall Restrictions Weight Bearing Restrictions: No RLE Weight Bearing: Weight bearing as tolerated General Chart Reviewed: Yes PT Amount of Missed Time (min): 35 Minutes PT Missed Treatment Reason: Patient fatigue Response to Previous Treatment: Patient reporting fatigue but able to participate. Family/Caregiver Present: No Vital Signs Pain Pain Assessment Pain Score: 5  Home Living/Prior Functioning Home Living Living Arrangements: Other relatives;Children Available Help at Discharge: Family;Available PRN/intermittently Type of Home: House Home Access: Stairs to enter Entrance  Stairs-Number of Steps: 4 Home Layout: 1/2 bath on main level;Two level Alternate Level Stairs-Number  of Steps: Has been sleeping on couch on 1st floor as unable to make it up full fight of stairs to bedroom Bathroom Accessibility: No Additional Comments: 1/2 bathroom on main level not accessible   Lives With: Family(71 y.o. nephew and 66 y.o. daughter) Prior Function Level of Independence: Independent with basic ADLs;Independent with homemaking with ambulation;Independent with gait;Requires assistive device for independence;Independent with transfers(Was independent prior to first knee surgery in September. Has been using crutches and required assist with ADLs since September)  Able to Take Stairs?: Yes Comments: Pt using crutches for the last week when injury occurred. Prior to this, independent. Does not work. Vision/Perception  Perception Perception: Within Functional Limits Praxis Praxis: Intact  Cognition Overall Cognitive Status: Within Functional Limits for tasks assessed Arousal/Alertness: Awake/alert Memory: Appears intact Awareness: Appears intact Problem Solving: Appears intact Safety/Judgment: Appears intact Sensation Sensation Light Touch: Appears Intact Coordination Gross Motor Movements are Fluid and Coordinated: No Fine Motor Movements are Fluid and Coordinated: Yes Coordination and Movement Description: Impaired due to generalized weakness and debility Motor  Motor Motor: Within Functional Limits;Other (comment) Motor - Skilled Clinical Observations: Limited by generalized weakness and debility  Mobility Transfers Transfers: Sit to Stand;Stand Pivot Transfers Sit to Stand: Contact Guard/Touching assist Stand Pivot Transfers: Contact Guard/Touching assist Transfer (Assistive device): Crutches Locomotion  Gait Ambulation: Yes Gait Assistance: Contact Guard/Touching assist Gait Distance (Feet): 30 Feet Assistive device: Crutches Gait Gait: Yes Gait  Pattern: Impaired Gait Pattern: Poor foot clearance - right;Poor foot clearance - left;Decreased stride length;Decreased stance time - right;Wide base of support Gait velocity: reduced Stairs / Additional Locomotion Stairs: No Wheelchair Mobility Wheelchair Mobility: No  Trunk/Postural Assessment  Cervical Assessment Cervical Assessment: Within Functional Limits Thoracic Assessment Thoracic Assessment: Within Functional Limits Lumbar Assessment Lumbar Assessment: Within Functional Limits Postural Control Postural Control: Within Functional Limits  Balance Balance Balance Assessed: Yes Static Sitting Balance Static Sitting - Balance Support: Feet supported;No upper extremity supported Static Sitting - Level of Assistance: 7: Independent Dynamic Sitting Balance Dynamic Sitting - Balance Support: During functional activity;Right upper extremity supported;Left upper extremity supported Dynamic Sitting - Level of Assistance: 5: Stand by assistance Sitting balance - Comments: Sitting on BSC completing lateral leans Static Standing Balance Static Standing - Balance Support: During functional activity;Right upper extremity supported;Left upper extremity supported Static Standing - Level of Assistance: 5: Stand by assistance;4: Min assist Static Standing - Comment/# of Minutes: Standing at sink, crutch for support Dynamic Standing Balance Dynamic Standing - Balance Support: During functional activity;Right upper extremity supported;Left upper extremity supported Dynamic Standing - Level of Assistance: 5: Stand by assistance;4: Min assist Dynamic Standing - Comments: Standing with use of crutches during LB bathing/dressing tasks Extremity Assessment  RUE Assessment RUE Assessment: Within Functional Limits LUE Assessment LUE Assessment: Within Functional Limits RLE Assessment RLE Assessment: Exceptions to [facility] Passive Range of Motion (PROM) Comments: [facility] General Strength Comments:  Grossly 4/5 throughout LLE Assessment LLE Assessment: Within Functional Limits    Refer to Care Plan for Long Term Goals  Recommendations for other services: None   Discharge Criteria: Patient will be discharged from PT if patient refuses treatment 3 consecutive times without medical reason, if treatment goals not met, if there is a change in medical status, if patient makes no progress towards goals or if patient is discharged from hospital.  The above assessment, treatment plan, treatment alternatives and goals were discussed and mutually agreed upon: by patient  Corliss Skains 07/31/2018, 1:20 PM

## 2018-07-31 NOTE — Progress Notes (Signed)
Per pt, nasal mask feels like its suffocating him, wants FFM instead. Pt given large FFM at this time, and states it feels better.

## 2018-07-31 NOTE — Progress Notes (Signed)
Pt has home CPAP at bedside, with new nasal mask. Pt states he can place himself on his machine when he is ready. Advised pt to notify for RT if any further assistance Is required.

## 2018-07-31 NOTE — IPOC Note (Signed)
Overall Plan of Care [facility](IPOC) Patient Details Name: Mark Baker MRN: [id number] DOB: 1980/12/24  Admitting Diagnosis: Debility  Hospital Problems: Principal Problem:   Debility Active Problems:   Morbid obesity (HCC)   CKD (chronic kidney disease), stage III (HCC)   Infection of right knee (HCC)   PAF (paroxysmal atrial fibrillation) (HCC)   Acute blood loss anemia   CHF (congestive heart failure) (HCC)   Congestion of upper respiratory tract     Functional Problem List: Nursing Bladder, Edema, Endurance, Medication Management, Motor, Nutrition, Safety, Skin Integrity  PT Endurance, Safety  OT Balance, Safety, Endurance  SLP    TR         Basic ADL's: OT Grooming, Bathing, Dressing, Toileting     Advanced  ADL's: OT       Transfers: PT Bed Mobility, Bed to Chair, Set designerCar, State Street CorporationFurniture  OT Toilet     Locomotion: PT Ambulation, Stairs     Additional Impairments: OT None  SLP        TR      Anticipated Outcomes Item Anticipated Outcome  Self Feeding Indep  Swallowing      Basic self-care  Supervision  Toileting  Supervision   Bathroom Transfers Supervision  Bowel/Bladder  mod I   Transfers  Supervision  Locomotion  Supervision  Communication     Cognition     Pain  n/a  Safety/Judgment  mod I   Therapy Plan: PT Intensity: Minimum of 1-2 x/day ,45 to 90 minutes PT Frequency: 5 out of 7 days PT Duration Estimated Length of Stay: 3-5 days OT Intensity: Minimum of 1-2 x/day, 45 to 90 minutes OT Frequency: 5 out of 7 days OT Duration/Estimated Length of Stay: 5-7 days      Team Interventions: Nursing Interventions Patient/Family Education, Bladder Management, Medication Management, Skin Care/Wound Management, Discharge Planning  PT interventions Ambulation/gait training, Community reintegration, DME/adaptive equipment instruction, Museum/gallery curatortair training, UE/LE Strength taining/ROM, UE/LE Coordination activities, Therapeutic Activities, Discharge planning,  Warden/rangerBalance/vestibular training, Disease management/prevention, Functional mobility training, Patient/family education, Therapeutic Exercise  OT Interventions Warden/rangerBalance/vestibular training, Community reintegration, Disease mangement/prevention, Equities traderatient/family education, Self Care/advanced ADL retraining, Therapeutic Exercise, UE/LE Coordination activities, UE/LE Strength taining/ROM, Therapeutic Activities, Psychosocial support, Pain management, Functional mobility training, DME/adaptive equipment instruction, Discharge planning  SLP Interventions    TR Interventions    SW/CM Interventions Discharge Planning, Psychosocial Support, Patient/Family Education   Barriers to Discharge MD  Medical stability, Wound care and Weight  Nursing Home environment access/layout, Wound Care, Weight, New oxygen new oxygen need, hygiene needs  PT Home environment access/layout 5 steps to enter home  OT Home environment access/layout, Decreased caregiver support Bedroom and full bathroom on second level of home. Intermittent assist available at d/c  SLP      SW Other (comments) [employer] insurance rarely finds Home helath agency to take their patients   Team Discharge Planning: Destination: PT-Home ,OT- Home , SLP-  Projected Follow-up: PT-Home health PT, OT-  Home health OT, SLP-  Projected Equipment Needs: PT-None recommended by PT, OT- To be determined, SLP-  Equipment Details: PT-has crutches, OT-Pt has BSC and RW Patient/family involved in discharge planning: PT- Patient,  OT-Patient, SLP-   MD ELOS: 2-4 days. Medical Rehab Prognosis:  Excellent Assessment: 37 year old male with history of morbid obesity, OSA - noncompliant with CPAP, PAF, hypertension, CKD, CM with chronic combined CHF, recent right quad tendon repair 6 weeks prior to admission developed purulent drainage from incision due to postop infection. He was admitted on 07/24/2018 for  I&D right knee by Dr. Aundria Rud. Postop course complicated by acute on  chronic hypoxic/hypercarbic respiratory failure with somnolence requiring BiPAP, A. fib, AKI with rise in serum creatinine to 3.86, hypotension as well as fluid overload due to cardiorenal syndrome. 2D echo done showing EF of 25 to 30%--difficult study due to body habitus. Dr. Warden Fillers for input and felt that patient with ATN due to hypotension and metabolic alkalosis was likely compensatory to respiratory acidosis. Urine output is improving and patient without indications for hemodialysis at this time. Somnolence has resolved and patient has been refusing BiPAP. Has been transitioned from IV heparin to warfarin. Therapy evaluations done revealing debility. Will set goals for Supervision with PT/OT.  See Team Conference Notes for weekly updates to the plan of care

## 2018-07-31 NOTE — Progress Notes (Signed)
Social Work  Social Work Assessment and Plan  Patient Details  Name: Mark Baker MRN: [id number] Date of Birth: 1980-11-15  Today's Date: 07/31/2018  Problem List:  Patient Active Problem List   Diagnosis Date Noted  . Acute blood loss anemia   . CHF (congestive heart failure) (HCC)   . Congestion of upper respiratory tract   . Debility 07/30/2018  . AKI (acute kidney injury) (HCC)   . Supplemental oxygen dependent   . PAF (paroxysmal atrial fibrillation) (HCC)   . Benign essential HTN   . Acute respiratory failure with hypoxia and hypercapnia (HCC) 07/25/2018  . Infection of right knee (HCC) 07/23/2018  . Rupture of right quadriceps muscle 06/10/2018  . Quadriceps tendon rupture, right, initial encounter 06/10/2018  . Vitamin D deficiency 12/10/2017  . Volume overload 11/25/2017  . Syncope 01/01/2017  . Erectile dysfunction 12/25/2016  . Encounter for therapeutic drug monitoring 11/19/2016  . Paroxysmal atrial fibrillation (HCC)   . Chronic HFrEF (heart failure with reduced ejection fraction) (HCC) 07/05/2016  . Morbid obesity with body mass index (BMI) of 60.0 to 69.9 in adult [facility](HCC) 07/05/2016  . CKD (chronic kidney disease), stage III (HCC) 07/05/2016  . Hyperlipidemia 11/25/2012  . Prolonged QT interval 11/24/2012  . Healthcare maintenance 09/03/2012  . HTN (hypertension) 02/19/2012  . Sleep apnea 02/10/2012  . Morbid obesity (HCC) 02/10/2012   Past Medical History:  Past Medical History:  Diagnosis Date  . Childhood asthma    no exacerbation since age 37   . Chronic combined systolic and diastolic CHF (congestive heart failure) (HCC)    a) ECHO (11/2012): EF 40-45%, RV fx difficult to see, nl size b) ECHO (05/2014): EF 20-25%, grade 2 DD, RV mildly dilated and sys fx mod reduced  . Hypertension   . Migraine    "down to maybe 2/year now; used to have them q other week" (11/26/2017)  . NICM (nonischemic cardiomyopathy) (HCC)    a) (11/2012): normal coronaries  .  OSA on CPAP    wears CPAP  . Postoperative wound infection    right knee  . Wears glasses    Past Surgical History:  Past Surgical History:  Procedure Laterality Date  . CARDIOVERSION N/A 11/07/2016   Procedure: CARDIOVERSION;  Surgeon: Dolores Pattyaniel R Bensimhon, MD;  [facility];  Service: Cardiovascular;  Laterality: N/A;  Will need extra help positioning patient  . INCISION AND DRAINAGE OF WOUND Right 07/23/2018   Procedure: Right knee  incisional irrigation and debridement;  Surgeon: Yolonda Kidaogers, Jason Patrick, MD;  [facility];  Service: Orthopedics;  Laterality: Right;  . KNEE ARTHROSCOPY Right 2003   ANTERIOR CRUCIATE LIGAMENT REPAIR   . LEFT HEART CATHETERIZATION WITH CORONARY ANGIOGRAM N/A 11/24/2012   Procedure: LEFT HEART CATHETERIZATION WITH CORONARY ANGIOGRAM;  Surgeon: Peter M SwazilandJordan, MD;  [facility];  Service: Cardiovascular;  Laterality: N/A;  . QUADRICEPS TENDON REPAIR Right 06/10/2018   Procedure: REPAIR QUADRICEP TENDON;  Surgeon: Yolonda Kidaogers, Jason Patrick, MD;  [facility];  Service: Orthopedics;  Laterality: Right;  . RIGHT/LEFT HEART CATH AND CORONARY ANGIOGRAPHY N/A 03/18/2017   Procedure: Right/Left Heart Cath and Coronary Angiography;  Surgeon: Dolores PattyBensimhon, Daniel R, MD;  [facility];  Service: Cardiovascular;  Laterality: N/A;  . TEE WITHOUT CARDIOVERSION N/A 11/07/2016   Procedure: TRANSESOPHAGEAL ECHOCARDIOGRAM (TEE);  Surgeon: Dolores Pattyaniel R Bensimhon, MD;  [facility];  Service: Cardiovascular;  Laterality: N/A;   Social History:  reports that he has never smoked. He  has never used smokeless tobacco. He reports previous alcohol use. He reports that he does not use drugs.  Family / Support Systems Marital Status: Separated Patient Roles: Parent, Other (Comment)(employee) Children: 55 yo daughter Other Supports: D[place]-nephew [phone]-cell  Seleta Rhymes [phone] Anticipated Caregiver: nephew and freinds Ability/Limitations  of Caregiver: nephew recently got a new job and will be working 3rd shift Pt will need to be mod/i to go home safely Caregiver Availability: Other (Comment)(At times alone) Family Dynamics: Close with family, Mom and nephew along with freinds. He feels if he can get moviong and back to walking withhis crutches he will be fine at home. He reports he has good supports.  Social History Preferred language: English Religion: Christian Cultural Background: No issues Education: High School Read: Yes Write: Yes Employment Status: Employed Name of Employer: [employer] Return to Work Plans: Plans to return to work has been out of work since surgery in 05/2018 Legal History/Current Legal Issues: No issues Guardian/Conservator: None-according to MD pt is capable of making his own decisions while here   Abuse/Neglect Abuse/Neglect Assessment Can Be Completed: Yes Physical Abuse: Denies Verbal Abuse: Denies Sexual Abuse: Denies Exploitation of patient/patient's resources: Denies Self-Neglect: Denies  Emotional Status Pt's affect, behavior and adjustment status: Pt has always been independent and able to take care of himself and his daughter. He plans to get back to this, his daughter actually helps him. he feels if he can get back to walking with crutches he will do well and be mod/i level Recent Psychosocial Issues: other health issues Psychiatric History: No history would benefit form seeing neuro-psych due to long course and complications he has had from his surgeries.  See if here long enough to see neuro-psych Substance Abuse History: No issues  Patient / Family Perceptions, Expectations & Goals Pt/Family understanding of illness & functional limitations: Pt is able to explain his surgery and now recent surgery and complications. He is feeling he is back on track and wants to get home for Christmas if he can. He does talk with the MD daily while on his rounds and feels his  questions and concerns are being addressed. Premorbid pt/family roles/activities: Father, son, employee, cousin, freind, etc Anticipated changes in roles/activities/participation: resume Pt/family expectations/goals: Pt states: " I want to get back to walking with crutches and home by Christmas."    [employer]: None Premorbid Home Care/DME Agencies: Other (Comment)([facility] couldn't find agency to take pt after first surgery) Transportation available at discharge: family and friends Resource referrals recommended: Neuropsychology  Discharge Planning Living Arrangements: Other relatives, Children Support Systems: Children, Other relatives, Parent, Friends/neighbors Type of Residence: Private residence Insurance Resources: Media planner (specify)([employer]) Surveyor, quantity Resources: Employment, Garment/textile technologist Screen Referred: No Living Expenses: Psychologist, sport and exercise Management: Patient Does the patient have any problems obtaining your medications?: No Home Management: Family Patient/Family Preliminary Plans: Return home with nephew and daughter with others coming in and checking on him. He needs to be mod/i so if alone can manage and be safe home alone. Will await therapy team evaluations and work on discharge needs. Sw Barriers to Discharge: Other (comments) Sw Barriers to Discharge Comments: [employer] insurance rarely finds Home helath agency to take their patients Social Work Anticipated Follow Up Needs: HH/OP  Clinical Impression Pleasant gentleman who is motivated to do well and get back to ambulating with crutches and healing form his surgery. He is glad to be here on rehab and will work hard his goal is to  get home by Christmas.  Lucy Chris 07/31/2018, 9:49 AM

## 2018-07-31 NOTE — Progress Notes (Signed)
ANTICOAGULATION CONSULT NOTE - Follow-Up Consult  Pharmacy Consult for warfarin Indication: history of atrial fibrillation  Allergies  Allergen Reactions  . Aspirin Hives, Itching and Rash  . Entresto [Sacubitril-Valsartan] Cough    Was on Entresto 07/2016-09/2016    Patient Measurements: Height: 6\' 3"  (190.5 cm) Weight: (!) 518 lb 1.3 oz (235 kg) IBW/kg (Calculated) : 84.5   Vital Signs: Temp: 97.5 F (36.4 C) (12/20 0530) Temp Source: Oral (12/20 0530) BP: 136/62 (12/20 0530) Pulse Rate: 73 (12/20 0530)  Labs: Recent Labs    07/29/18 0231 07/30/18 0235 07/31/18 0622  HGB 10.6*  --  10.8*  HCT 37.3*  --  38.8*  PLT 313  --  306  LABPROT 27.9* 30.3* 34.2*  INR 2.65 2.95 3.45  CREATININE 2.66* 2.12* 2.01*    Estimated Creatinine Clearance: 102 mL/min (A) (by C-G formula based on SCr of 2.01 mg/dL (H)).  Assessment: 8 yoM with morbidy obesity and on Coumadin 3.75mg  exc 7.5mg  on MWF PTA for PAF. Resumed 12/12. INR trending up to 3.45. Continues on PTA Amiodarone 200 mg bid so not a new drug interaction. Hgb low but stable, pltc wnl. No bleeding noted  Goal of Therapy:  INR 2-3 Monitor platelets by anticoagulation protocol: Yes   Plan:  Hold Coumadin tonight Monitor daily INR, CBC, s/s of bleed  Enzo Bi, PharmD, BCPS, [facility] Clinical Pharmacist Phone number [phone] 07/31/2018 7:49 AM

## 2018-07-31 NOTE — Progress Notes (Signed)
Occupational Therapy Session Note  Patient Details  Name: Mark Baker MRN: [id number] Date of Birth: 10/09/1980  Today's Date: 07/31/2018 OT Individual Time: 1400-1513 OT Individual Time Calculation (min): 73 min    Short Term Goals: Week 1:  OT Short Term Goal 1 (Week 1): STG=LTG due to LOS  Skilled Therapeutic Interventions/Progress Updates:  1;1. Pt asleep upon arrival with no c/o pain throughout session. OT educates on sock aide, shoe funnel, reacher and dressing stick and provides handouts with pictures for purchasing information per pt request after use in tx to determine which works best for pt. Pt reports needing to toilet, and ambulates with CGA with crutches recliner<>bariatric BSC over toilet to void B&B (OT holding urinal for bladder void). Pt able to complete posterior hygiene seated with set up for wet washcloths. Pt returns to recliner as stated above and OT reviews UB therex/provides 4# dowel rod/green and orange bands. Pt completes 1x15 shoulder press, chest press, elbow flex/ext, shoulder flex/ext, int/ext rotation, and horizontal ab/adduct with dowel rod and instructional cues. Instructed pt in towel slides for knee ROM and adaptations for home on carpet. Exited session with pt seated in recliner, exit alarm on and call light inreach  Therapy Documentation Precautions:  Precautions Precautions: Fall Restrictions Weight Bearing Restrictions: No RLE Weight Bearing: Weight bearing as tolerated General: General Chart Reviewed: Yes PT Missed Treatment Reason: Patient fatigue Vital Signs: Therapy Vitals Temp: 97.6 F (36.4 C) Temp Source: Oral Pulse Rate: 75 Resp: 20 BP: 138/83 Patient Position (if appropriate): Sitting Oxygen Therapy SpO2: (!) 89 % O2 Device: Room Air Pain:   ADL:   Vision Baseline Vision/History: Wears glasses Wears Glasses: At all times Patient Visual Report: No change from baseline Vision Assessment?: No apparent visual  deficits Perception  Perception: Within Functional Limits Praxis Praxis: Intact Exercises:   Other Treatments:     Therapy/Group: Individual Therapy  Shon Hale 07/31/2018, 3:16 PM

## 2018-07-31 NOTE — Progress Notes (Addendum)
[place] PHYSICAL MEDICINE & REHABILITATION PROGRESS NOTE  Subjective/Complaints: Patient seen sitting up this morning.  He states he slept fairly overnight.  He notes congestion.  He is ready to begin therapies.  ROS: +Congestion. Denies CP, SOB, N/V/D.  Objective: Vital Signs: Blood pressure 136/62, pulse 73, temperature (!) 97.5 F (36.4 C), temperature source Oral, resp. rate 16, height 6\' 3"  (1.905 m), weight (!) 235 kg, SpO2 96 %. No results found. Recent Labs    07/29/18 0231 07/31/18 0622  WBC 8.5 7.6  HGB 10.6* 10.8*  HCT 37.3* 38.8*  PLT 313 306   Recent Labs    07/30/18 0235 07/31/18 0622  NA 137 140  K 3.8 4.1  CL 92* 92*  CO2 37* 40*  GLUCOSE 120* 101*  BUN 42* 33*  CREATININE 2.12* 2.01*  CALCIUM 8.5* 8.7*    Physical Exam: BP 136/62 (BP Location: Right Arm)   Pulse 73   Temp (!) 97.5 F (36.4 C) (Oral)   Resp 16   Ht 6\' 3"  (1.905 m)   Wt (!) 235 kg   SpO2 96%   BMI 64.76 kg/m  Constitutional: No distress . Vital signs reviewed.  Morbid obesity. HENT: Normocephalic.  Atraumatic. Eyes: EOMI. No discharge. Cardiovascular: RRR. No JVD. Respiratory: Normal effort.  + Congestion. GI: BS +. Non-distended. Musc: Right knee with some edema and tenderness.   Neurological: He is alertand oriented. Speech clearer and spontaneous.  Able to follow basic commands without difficulty. Motor: 5/5 throughout bilateral upper extremities Right lower extremity: Hip flexion 2/5, knee limited by dressing, ankle dorsiflexion 4/5 Left lower extremity: Hip flexion 4/5, knee extension 4/5, ankle dorsiflexion 5/5 Skin: Right knee with dressing C/D/I  Assessment/Plan: 1. Functional deficits secondary to debility which require 3+ hours per day of interdisciplinary therapy in a comprehensive inpatient rehab setting.  Physiatrist is providing close team supervision and 24 hour management of active medical problems listed below.  Physiatrist and rehab team  continue to assess barriers to discharge/monitor patient progress toward functional and medical goals  Care Tool:  Bathing              Bathing assist       Upper Body Dressing/Undressing Upper body dressing        Upper body assist      Lower Body Dressing/Undressing Lower body dressing            Lower body assist       Toileting Toileting    Toileting assist Assist for toileting: Minimal Assistance - Patient > 75%     Transfers Chair/bed transfer  Transfers assist     Chair/bed transfer assist level: Minimal Assistance - Patient > 75%     Locomotion Ambulation   Ambulation assist              Walk 10 feet activity   Assist           Walk 50 feet activity   Assist           Walk 150 feet activity   Assist           Walk 10 feet on uneven surface  activity   Assist           Wheelchair     Assist               Wheelchair 50 feet with 2 turns activity    Assist  Wheelchair 150 feet activity     Assist            Medical Problem List and Plan: 1. Debility secondary to post op VDRF  Begin CIR  Notes reviewed- right quad tendon repair with subsequent infection, images ordered, labs reviewed. 2. DVT Prophylaxis/Anticoagulation: Pharmaceutical:Coumadin 3. Pain Management:Tylenol as needed 4. Mood:LCSW to follow for evaluation and support 5. Neuropsych: This patientiscapable of making decisions on hisown behalf. 6. Skin/Wound Care:Routine pressure relief measures 7. Fluids/Electrolytes/Nutrition:Monitor I's and O's.  8. CM with combined WGN:FAOZH weights daily and monitor for signs of overload. Continue Demadex, Imdur, Apresoline and Lipitor Filed Weights   07/30/18 1758  Weight: (!) 235 kg  9.Acute on chronic YQM:VHQIONGE to monitor for trend. Baseline SCr 1.4-1.5 range?   Creatinine 2.01 on 12/20  Continue to monitor 10. HTN:On Imdur, Demadex and  Apresoline monitor blood pressure twice daily.  Monitor with increased mobility 11. Acute on chronic respiratory failure/OSA: Non-compliant with BIPAP. Continue to monitor respiratory status.  Chest x-ray ordered due to complaints of congestion with productive sputum 12. Morbid obesity:Educated patient on heart healthy diet as well as importance of weight loss to help promote mobility and overall health. 13. A XBM:WUXLKGM heart rate twice daily. On amiodarone and Coreg twice daily as well as digoxin daily. Continue Coumadin 14.  Acute blood loss anemia  Hemoglobin 10.8 on 12/20  Continue to monitor 15.  Congestion  Flutter valve ordered  LOS: 1 days A FACE TO FACE EVALUATION WAS PERFORMED   Karis Juba 07/31/2018, 8:36 AM

## 2018-08-01 ENCOUNTER — Inpatient Hospital Stay (HOSPITAL_COMMUNITY): Payer: Managed Care, Other (non HMO)

## 2018-08-01 ENCOUNTER — Inpatient Hospital Stay (HOSPITAL_COMMUNITY): Payer: Managed Care, Other (non HMO) | Admitting: Physical Therapy

## 2018-08-01 ENCOUNTER — Other Ambulatory Visit: Payer: Self-pay

## 2018-08-01 LAB — PROTIME-INR
INR: 2.86
Prothrombin Time: 29.5 seconds — ABNORMAL HIGH (ref 11.4–15.2)

## 2018-08-01 MED ORDER — WARFARIN SODIUM 2.5 MG PO TABS
3.7500 mg | ORAL_TABLET | Freq: Once | ORAL | Status: AC
  Administered 2018-08-01: 3.75 mg via ORAL
  Filled 2018-08-01: qty 1

## 2018-08-01 MED ORDER — TORSEMIDE 20 MG PO TABS
60.0000 mg | ORAL_TABLET | Freq: Every day | ORAL | Status: DC
  Administered 2018-08-02 – 2018-08-04 (×3): 60 mg via ORAL
  Filled 2018-08-01 (×3): qty 3

## 2018-08-01 MED ORDER — SPIRONOLACTONE 25 MG PO TABS
25.0000 mg | ORAL_TABLET | Freq: Every day | ORAL | Status: DC
  Administered 2018-08-01 – 2018-08-04 (×4): 25 mg via ORAL
  Filled 2018-08-01 (×4): qty 1

## 2018-08-01 NOTE — Progress Notes (Signed)
Per patient request, patient taken off home CPAP and placed on hospital supplied CPAP.  Patient states his home machine doesn't blow with enough pressure.

## 2018-08-01 NOTE — Progress Notes (Signed)
Occupational Therapy Session Note  Patient Details  Name: Mark Baker MRN: [id number] Date of Birth: 07/04/1981  Today's Date: 08/01/2018 OT Individual Time: 1400-1455 OT Individual Time Calculation (min): 55 min    Short Term Goals: Week 1:  OT Short Term Goal 1 (Week 1): STG=LTG due to LOS  Skilled Therapeutic Interventions/Progress Updates:    1:1. Family present in room and leaves for session. Pt completes LB dressing donning shorts with dressing stick to thread BLE into pants. Pt elects to doff pants as it is easier to use urinals with diuretics wearing only gown. Pt dons second gown and transfers into w/c with crutches and CGA. Pt completes 3x30 ball passes seated for improved BUE strength/endruance required for BADLs (bounce, chest and overhead pass) with VC for no rest breaks. O2 >95% on RA. Pt requries long rest breaks d/t fatigue. Exited session with pt seated in recliener, exit alarm on and call lightin reach  Therapy Documentation Precautions:  Precautions Precautions: Fall Restrictions Weight Bearing Restrictions: Yes RLE Weight Bearing: Weight bearing as tolerated General: General PT Missed Treatment Reason: Pain Vital Signs:   Therapy/Group: Individual Therapy  Shon Hale 08/01/2018, 2:29 PM

## 2018-08-01 NOTE — Progress Notes (Signed)
ANTICOAGULATION CONSULT NOTE - Follow-Up Consult  Pharmacy Consult for warfarin Indication: history of atrial fibrillation  Allergies  Allergen Reactions  . Aspirin Hives, Itching and Rash  . Entresto [Sacubitril-Valsartan] Cough    Was on Entresto 07/2016-09/2016    Patient Measurements: Height: 6\' 3"  (190.5 cm) Weight: (!) 518 lb 1.3 oz (235 kg) IBW/kg (Calculated) : 84.5  Vital Signs: BP: 159/81 (12/21 0515) Pulse Rate: 84 (12/21 0515)  Labs: Recent Labs    07/30/18 0235 07/31/18 0622 08/01/18 0500  HGB  --  10.8*  --   HCT  --  38.8*  --   PLT  --  306  --   LABPROT 30.3* 34.2* 29.5*  INR 2.95 3.45 2.86  CREATININE 2.12* 2.01*  --     Estimated Creatinine Clearance: 102 mL/min (A) (by C-G formula based on SCr of 2.01 mg/dL (H)).  Assessment: 28 yoM with morbidy obesity and on Coumadin 3.75mg  except 7.5mg  on MWF PTA for PAF. Pharmacy has been consulted to dose warfarin.   Warfarin held last night for supratherapeutic INR. This morning, INR trending down significantly to therapeutic at 2.86. Patient also continues on PTA Amiodarone 200 mg twice daily, so not a new drug interaction. However, cefazolin (started 12/12) has interaction with warfarin and may increase INR. Last CBC on 12/20 low but stable, and no documented bleeding.   Goal of Therapy:  INR 2-3 Monitor platelets by anticoagulation protocol: Yes   Plan:  Warfarin 3.75 mg PO x1  Monitor daily INR, CBC, s/sx of bleed   Harlow Mares, PharmD PGY1 Pharmacy Resident Phone [phone]  08/01/2018   11:14 AM

## 2018-08-01 NOTE — Progress Notes (Signed)
Physical Therapy Session Note  Patient Details  Name: Isah Catbagan MRN: [id number] Date of Birth: May 17, 1981  Today's Date: 08/01/2018 PT Individual Time: 1100-1145 PT Individual Time Calculation (min): 45 min   Short Term Goals: Week 1:  PT Short Term Goal 1 (Week 1): =LTG due to estimated LOS  Skilled Therapeutic Interventions/Progress Updates:   Pt in recliner and agreeable to therapy, denies pain. Stand pivot transfer to w/c w/ crutches, supervision. Total assist w/c transport to/from therapy gym for energy conservation. Attempted to practice stairs, pt reports being unable because he would go up w/ crutches but bump down on his bottom before readmission. Therapist felt this was unsafe and pt decilned to attempt in stairwell steps, states "I'm not worried about steps". RLE strengthening and ROM exercises as detailed below, no increased pain. Returned to room, ended session in recliner and all needs in reach.   RLE strengthening and ROM: -heel slides 3x10 -assisted LAQs 3x10 -passive hamstring stretch 30 sec x3 -standing knee march 2x10 -standing knee flexion 2x10  Therapy Documentation Precautions:  Precautions Precautions: Fall Restrictions Weight Bearing Restrictions: Yes RLE Weight Bearing: Weight bearing as tolerated  Therapy/Group: Individual Therapy  Alvah Gilder Melton Krebs 08/01/2018, 11:48 AM

## 2018-08-01 NOTE — Progress Notes (Signed)
Physical Therapy Session Note  Patient Details  Name: Emron Reckner MRN: [id number] Date of Birth: 1981/03/20  Today's Date: 08/01/2018  Short Term Goals: Week 1:  PT Short Term Goal 1 (Week 1): =LTG due to estimated LOS  Skilled Therapeutic Interventions/Progress Updates: Pt missed 75 min d/t pain from headache; RN present administering medication. Continue per POC as pt able to tolerate.     Therapy Documentation Precautions:  Precautions Precautions: Fall Restrictions Weight Bearing Restrictions: No RLE Weight Bearing: Weight bearing as tolerated General: PT Amount of Missed Time (min): 75 Minutes PT Missed Treatment Reason: Pain Pain: Pain Assessment Pain Scale: 0-10 Pain Score: 6  Pain Type: Acute pain Pain Location: Head Pain Descriptors / Indicators: Headache Pain Frequency: Intermittent Pain Onset: On-going Pain Intervention(s): Medication (See eMAR)    Therapy/Group: Individual Therapy  Harlon Ditty 08/01/2018, 11:17 AM

## 2018-08-01 NOTE — Progress Notes (Signed)
Mark Baker is a 37 y.o. male [birth year] [id number]  Subjective: No new complaints. No new problems. Slept well. Feeling OK.  Objective: Vital signs in last 24 hours: Temp:  [97.6 F (36.4 C)-98.4 F (36.9 C)] 98.4 F (36.9 C) (12/20 1923) Pulse Rate:  [75-84] 84 (12/21 0515) Resp:  [18-20] 18 (12/21 0515) BP: (138-159)/(77-83) 159/81 (12/21 0515) SpO2:  [89 %-94 %] 92 % (12/21 0515) Weight change:  Last BM Date: 07/25/18  Intake/Output from previous day: 12/20 0701 - 12/21 0700 In: 460 [P.O.:460] Out: 2075 [Urine:2075] Last cbgs: CBG (last 3)  No results for input(s): GLUCAP in the last 72 hours.   Physical Exam General: No apparent distress.  Eating breakfast.  Morbidly obese. HEENT: not dry Lungs: Normal effort. Lungs clear to auscultation, no crackles or wheezes. Cardiovascular: Regular rate and rhythm, no edema Abdomen: S/NT/ND; BS(+) Musculoskeletal:  unchanged Neurological: No new neurological deficits Wounds: Dressed Skin: clear   Mental state: Alert, oriented, cooperative 1-2+ peripheral edema    Lab Results: BMET    Component Value Date/Time   NA 140 07/31/2018 0622   NA 145 (H) 12/08/2017 1413   K 4.1 07/31/2018 0622   CL 92 (L) 07/31/2018 0622   CO2 40 (H) 07/31/2018 0622   GLUCOSE 101 (H) 07/31/2018 0622   BUN 33 (H) 07/31/2018 0622   BUN 11 12/08/2017 1413   CREATININE 2.01 (H) 07/31/2018 0622   CREATININE 1.45 (H) 01/18/2015 1045   CALCIUM 8.7 (L) 07/31/2018 0622   GFRNONAA 41 (L) 07/31/2018 0622   GFRNONAA 62 01/18/2015 1045   GFRAA 47 (L) 07/31/2018 0622   GFRAA 72 01/18/2015 1045   CBC    Component Value Date/Time   WBC 7.6 07/31/2018 0622   RBC 4.23 07/31/2018 0622   HGB 10.8 (L) 07/31/2018 0622   HCT 38.8 (L) 07/31/2018 0622   PLT 306 07/31/2018 0622   MCV 91.7 07/31/2018 0622   MCV 90.3 04/03/2017 1037   MCH 25.5 (L) 07/31/2018 0622   MCHC 27.8 (L) 07/31/2018 0622   RDW 14.2 07/31/2018 0622   LYMPHSABS 0.6 (L) 07/31/2018  0622   MONOABS 1.2 (H) 07/31/2018 0622   EOSABS 0.2 07/31/2018 0622   BASOSABS 0.0 07/31/2018 0622    Studies/Results: Dg Chest 2 View  Result Date: 07/31/2018 CLINICAL DATA:  Postoperative radiograph following incision and drainage of a right knee wound. History of CHF EXAM: CHEST - 2 VIEW COMPARISON:  Portable chest x-ray of July 26, 2018 FINDINGS: The cardiac silhouette remains enlarged. The central pulmonary vascularity is engorged and the more peripheral pulmonary vascularity is engorged and indistinct. There is no pleural effusion. The trachea is midline. The bony thorax exhibits no acute abnormality. IMPRESSION: CHF with mild interstitial edema more conspicuous than on the previous study. Enlargement of the cardiac silhouette may reflect CHF but a pericardial effusion is not excluded. Electronically Signed   By: David  [place] M.D.   On: 07/31/2018 10:40    Medications: I have reviewed the patient's current medications.  Assessment/Plan:   1.  Debility is secondary to postop VDRF.  Status post right quad tendon repair with subsequent infection.  Continue with CIR. 2.  DVT prophylaxis with Coumadin 3.  Pain management with Tylenol 4.  Cardiomyopathy with combined CHF.  The patient was on Demadex 60 mg a day and Spironolactone of unknown dose at home.  He seems to be developing more swelling.  Chest x-ray with CHF changes.  Will restart Demadex and spironolactone.  Monitor electrolytes.  It would be ideal if we could normalize his albumin. 5.  Acute on chronic renal failure will monitor creatinine especially with intensified diuresis 6.  Hypertension.  Continue with blood pressure meds 7.  Acute on chronic respiratory failure.  History of noncompliance with BiPAP. 8.  Morbid obesity.  Continue with education on good nutrition.  His albumin is low. 9.  Atrial fibrillation on amiodarone and Coreg.  He is on digoxin and Coumadin 10.  Acute blood loss anemia.  Will monitor  hemoglobin     Length of stay, days: 2  Sonda PrimesAlex Magdelene Ruark , MD 08/01/2018, 11:59 AM

## 2018-08-02 ENCOUNTER — Inpatient Hospital Stay (HOSPITAL_COMMUNITY): Payer: Managed Care, Other (non HMO) | Admitting: Physical Therapy

## 2018-08-02 ENCOUNTER — Inpatient Hospital Stay (HOSPITAL_COMMUNITY): Payer: Managed Care, Other (non HMO) | Admitting: Occupational Therapy

## 2018-08-02 LAB — BASIC METABOLIC PANEL
Anion gap: 9 (ref 5–15)
BUN: 25 mg/dL — ABNORMAL HIGH (ref 6–20)
CALCIUM: 8.6 mg/dL — AB (ref 8.9–10.3)
CO2: 39 mmol/L — ABNORMAL HIGH (ref 22–32)
Chloride: 93 mmol/L — ABNORMAL LOW (ref 98–111)
Creatinine, Ser: 1.89 mg/dL — ABNORMAL HIGH (ref 0.61–1.24)
GFR calc Af Amer: 51 mL/min — ABNORMAL LOW (ref >60)
GFR calc non Af Amer: 44 mL/min — ABNORMAL LOW (ref >60)
Glucose, Bld: 103 mg/dL — ABNORMAL HIGH (ref 70–99)
Potassium: 4.2 mmol/L (ref 3.5–5.1)
Sodium: 141 mmol/L (ref 135–145)

## 2018-08-02 LAB — PROTIME-INR
INR: 2.47
Prothrombin Time: 26.4 seconds — ABNORMAL HIGH (ref 11.4–15.2)

## 2018-08-02 MED ORDER — WARFARIN SODIUM 2.5 MG PO TABS
3.7500 mg | ORAL_TABLET | Freq: Once | ORAL | Status: AC
  Administered 2018-08-02: 3.75 mg via ORAL
  Filled 2018-08-02: qty 1

## 2018-08-02 NOTE — Progress Notes (Signed)
Physical Therapy Session Note  Patient Details  Name: Mark Baker MRN: [id number] Date of Birth: October 30, 1980  Today's Date: 08/02/2018 PT Missed Time: 75 Minutes Missed Time Reason: Patient unwilling to participate;Other (Comment)(frequent voiding)  Pt declining therapy this morning as he is voiding frequently, pt voided in urinal while therapist was present and states it's every few minutes. Pt apologetic and eager to work with therapy but states "my body can't do anything when I take that much lasix". Missed 75 min of skilled PT.   Nidhi Jacome Melton Krebs 08/02/2018, 11:25 AM

## 2018-08-02 NOTE — Plan of Care (Signed)
  Problem: Consults Goal: RH GENERAL PATIENT EDUCATION Description See Patient Education module for education specifics. 08/02/2018 1531 by Kalman Shan, LPN Outcome: Progressing 08/02/2018 1530 by Doran Durand A, LPN Outcome: Progressing Goal: Skin Care Protocol Initiated - if Braden Score 18 or less Description If consults are not indicated, leave blank or document N/A 08/02/2018 1531 by Kalman Shan, LPN Outcome: Progressing 08/02/2018 1530 by Doran Durand A, LPN Outcome: Progressing Goal: Nutrition Consult-if indicated 08/02/2018 1531 by Kalman Shan, LPN Outcome: Progressing 08/02/2018 1530 by Doran Durand A, LPN Outcome: Progressing   Problem: RH SKIN INTEGRITY Goal: RH STG SKIN FREE OF INFECTION/BREAKDOWN Description Patient will be free of infection at discharge  08/02/2018 1531 by Kalman Shan, LPN Outcome: Progressing 08/02/2018 1530 by Kalman Shan, LPN Outcome: Progressing Goal: RH STG MAINTAIN SKIN INTEGRITY WITH ASSISTANCE Description STG Maintain Skin Integrity With  Min Assistance.  08/02/2018 1531 by Kalman Shan, LPN Outcome: Progressing 08/02/2018 1530 by Kalman Shan, LPN Outcome: Progressing Goal: RH STG ABLE TO PERFORM INCISION/WOUND CARE W/ASSISTANCE Description STG Able To Perform Incision/Wound Care With max  Assistance.  08/02/2018 1531 by Kalman Shan, LPN Outcome: Progressing 08/02/2018 1530 by Kalman Shan, LPN Outcome: Progressing   Problem: RH SAFETY Goal: RH STG ADHERE TO SAFETY PRECAUTIONS W/ASSISTANCE/DEVICE Description STG Adhere to Safety Precautions With  Mod I Marcella Dubs.  08/02/2018 1531 by Kalman Shan, LPN Outcome: Progressing 08/02/2018 1530 by Kalman Shan, LPN Outcome: Progressing   Problem: RH KNOWLEDGE DEFICIT GENERAL Goal: RH STG INCREASE KNOWLEDGE OF SELF CARE AFTER HOSPITALIZATION Description Patient will be able to verbalize knowledge of  self care mod I   08/02/2018 1531 by Kalman Shan, LPN Outcome: Progressing 08/02/2018 1530 by Kalman Shan, LPN Outcome: Progressing

## 2018-08-02 NOTE — Progress Notes (Signed)
Mark Baker is a 37 y.o. male [birth year] [id number]  Subjective: No new complaints. No new problems. Slept well. Feeling OK.  Objective: Vital signs in last 24 hours: Temp:  [97.6 F (36.4 C)-98.4 F (36.9 C)] 97.7 F (36.5 C) (12/22 1342) Pulse Rate:  [75-77] 77 (12/22 1342) Resp:  [16-19] 19 (12/22 1342) BP: (93-148)/(73-77) 93/76 (12/22 1342) SpO2:  [98 %-100 %] 100 % (12/22 1342) Weight change:  Last BM Date: (08/02/2018)  Intake/Output from previous day: 12/21 0701 - 12/22 0700 In: 660 [P.O.:660] Out: 1125 [Urine:1125] Last cbgs: CBG (last 3)  No results for input(s): GLUCAP in the last 72 hours.   Physical Exam General: No apparent distress.  Morbidly obese.  In a wheelchair HEENT: not dry Lungs: Normal effort. Lungs clear to auscultation, no crackles or wheezes. Cardiovascular: irregular rate and rhythm,  edema is unchanged Abdomen: S/NT/ND; BS(+) Musculoskeletal:  unchanged Neurological: No new neurological deficits Wounds: Clean    Skin: clear   Mental state: Alert, oriented, cooperative    Lab Results: BMET    Component Value Date/Time   NA 141 08/02/2018 0540   NA 145 (H) 12/08/2017 1413   K 4.2 08/02/2018 0540   CL 93 (L) 08/02/2018 0540   CO2 39 (H) 08/02/2018 0540   GLUCOSE 103 (H) 08/02/2018 0540   BUN 25 (H) 08/02/2018 0540   BUN 11 12/08/2017 1413   CREATININE 1.89 (H) 08/02/2018 0540   CREATININE 1.45 (H) 01/18/2015 1045   CALCIUM 8.6 (L) 08/02/2018 0540   GFRNONAA 44 (L) 08/02/2018 0540   GFRNONAA 62 01/18/2015 1045   GFRAA 51 (L) 08/02/2018 0540   GFRAA 72 01/18/2015 1045   CBC    Component Value Date/Time   WBC 7.6 07/31/2018 0622   RBC 4.23 07/31/2018 0622   HGB 10.8 (L) 07/31/2018 0622   HCT 38.8 (L) 07/31/2018 0622   PLT 306 07/31/2018 0622   MCV 91.7 07/31/2018 0622   MCV 90.3 04/03/2017 1037   MCH 25.5 (L) 07/31/2018 0622   MCHC 27.8 (L) 07/31/2018 0622   RDW 14.2 07/31/2018 0622   LYMPHSABS 0.6 (L) 07/31/2018 0622    MONOABS 1.2 (H) 07/31/2018 0622   EOSABS 0.2 07/31/2018 0622   BASOSABS 0.0 07/31/2018 0622    Studies/Results: No results found.  Medications: I have reviewed the patient's current medications.  Assessment/Plan:  1.  Debility secondary to postop VDRF.  Status post right quad tendon repair with subsequent infection.  Continue with CIR 2.  DVT prophylaxis with Coumadin 3.  Pain management with Tylenol 4.  Cardiomyopathy with combined CHF.  Severe leg edema.  The patient was put back on Demadex at his home dose and restarted on spironolactone at the lower dose.  We will continue to monitor renal function/electrolytes.  It would help to normalize his albumin 5.  Acute on chronic renal failure.  Monitoring his creatinine 6.  Hypertension.  Continue with current meds 7.  Acute on chronic respiratory failure.  History of noncompliance with BiPAP 8.  Morbid obesity.  Continue with education on good nutrition.  His albumin remains low 9.  Atrial fibrillation on amiodarone and Coreg.  He is also on digoxin and Coumadin 10.  Acute blood loss anemia.  We are monitoring his hemoglobin      Length of stay, days: 3  Sonda Primes , MD 08/02/2018, 2:02 PM

## 2018-08-02 NOTE — Progress Notes (Signed)
Occupational Therapy Session Note  Patient Details  Name: Mark Baker MRN: [id number] Date of Birth: Sep 30, 1980  Today's Date: 08/02/2018 OT Individual Time: 0702-0800 OT Individual Time Calculation (min): 58 min    Short Term Goals: Week 1:  OT Short Term Goal 1 (Week 1): STG=LTG due to LOS  Skilled Therapeutic Interventions/Progress Updates:    Pt seen for OT ession focusing on ADL re-training, hme accessibility and functional activity tolerance. Pt asleep in supine upon arrival , easily arroused and agreeable to tx session. He denied need for bathing/dressing session this morning, reports his said assisted him in bathing last night. Discussed at length pt's CLOF vs goal level, requirements for pt to be able to access home including stairs etc. Pt reports feeling ready for d/c home to mom's house. He reports that he will only complete sponge bathing at d/c and declined praciciting shower transfers. He ambulated throughout room with crutches and supervision. Completed grooming tasks standing at sink with distant supervison, tolerating ~3 minutes in standing. Pt then with nose bleeding event, sat in w/c to manage, RN made aware. Following rest, he stood to complete remainder of tass. Educated regading importance and benefit sof incorportating activitity and standing  into ADL tasks. Sit>stands and dynamic standing tasks completed with distant supervision. He self propelled w/c to ADL apartment for UE strengthening and general activity tolerance, rest break required once arrived to apartment. Pt declined practicing standard bed transfer as fear of weight limit of standard bed. He then completed sit>Stand from low soft recliner, with increased time and heavy UE support, able to come into standing with supervision.  Pt taken back to room total A in w/c for time management. Completed functional ambulation into bathroom with crutches and distant supervision. Completed toileting task at set-up assist. Pt  returned to recliner at end of session, left seated with all needs in reach, set-up with breakfast tray.   Therapy Documentation Precautions:  Precautions Precautions: Fall Restrictions Weight Bearing Restrictions: Yes RLE Weight Bearing: Weight bearing as tolerated Pain:   No/denies pain   Therapy/Group: Individual Therapy  Anshu Wehner L 08/02/2018, 6:38 AM

## 2018-08-02 NOTE — Plan of Care (Signed)
  Problem: Consults Goal: RH GENERAL PATIENT EDUCATION Description See Patient Education module for education specifics. Outcome: Progressing Goal: Skin Care Protocol Initiated - if Braden Score 18 or less Description If consults are not indicated, leave blank or document N/A Outcome: Progressing Goal: Nutrition Consult-if indicated Outcome: Progressing   Problem: RH SKIN INTEGRITY Goal: RH STG SKIN FREE OF INFECTION/BREAKDOWN Description Patient will be free of infection at discharge  Outcome: Progressing Goal: RH STG MAINTAIN SKIN INTEGRITY WITH ASSISTANCE Description STG Maintain Skin Integrity With  Min Assistance.  Outcome: Progressing Goal: RH STG ABLE TO PERFORM INCISION/WOUND CARE W/ASSISTANCE Description STG Able To Perform Incision/Wound Care With max  Assistance.  Outcome: Progressing   Problem: RH SAFETY Goal: RH STG ADHERE TO SAFETY PRECAUTIONS W/ASSISTANCE/DEVICE Description STG Adhere to Safety Precautions With  Mod I Mark Baker.  Outcome: Progressing   Problem: RH KNOWLEDGE DEFICIT GENERAL Goal: RH STG INCREASE KNOWLEDGE OF SELF CARE AFTER HOSPITALIZATION Description Patient will be able to verbalize knowledge of self care mod I   Outcome: Progressing

## 2018-08-02 NOTE — Progress Notes (Signed)
Pt placed self on BIPAP for the night. Will monitor

## 2018-08-02 NOTE — Progress Notes (Signed)
ANTICOAGULATION CONSULT NOTE - Follow-Up Consult  Pharmacy Consult for warfarin Indication: history of atrial fibrillation  Allergies  Allergen Reactions  . Aspirin Hives, Itching and Rash  . Entresto [Sacubitril-Valsartan] Cough    Was on Entresto 07/2016-09/2016    Patient Measurements: Height: 6\' 3"  (190.5 cm) Weight: (!) 518 lb 1.3 oz (235 kg) IBW/kg (Calculated) : 84.5  Vital Signs: Temp: 97.6 F (36.4 C) (12/22 0440) Temp Source: Oral (12/22 0440) BP: 148/77 (12/22 0440) Pulse Rate: 77 (12/22 0440)  Labs: Recent Labs    07/31/18 0622 08/01/18 0500 08/02/18 0540  HGB 10.8*  --   --   HCT 38.8*  --   --   PLT 306  --   --   LABPROT 34.2* 29.5* 26.4*  INR 3.45 2.86 2.47  CREATININE 2.01*  --  1.89*    Estimated Creatinine Clearance: 108.5 mL/min (A) (by C-G formula based on SCr of 1.89 mg/dL (H)).  Assessment: 14 yoM with morbidy obesity and on Coumadin 3.75mg  except 7.5mg  on MWF PTA for PAF. Pharmacy has been consulted to dose warfarin.   This morning, INR trending down but remains therapeutic at 2.47. Patient also continues on PTA Amiodarone 200 mg twice daily, so not a new drug interaction. However, cefazolin (started 12/12) has interaction with warfarin and may increase INR. Last CBC on 12/20 low but stable, and no documented bleeding.   Goal of Therapy:  INR 2-3 Monitor platelets by anticoagulation protocol: Yes   Plan:  Warfarin 3.75 mg PO x1  Monitor daily INR, CBC, s/sx of bleed   Harlow Mares, PharmD PGY1 Pharmacy Resident Phone [phone]  08/02/2018   8:27 AM

## 2018-08-02 NOTE — Progress Notes (Signed)
Physical Therapy Session Note  Patient Details  Name: Mark Baker MRN: [id number] Date of Birth: 09-11-1980  Today's Date: 08/02/2018     Short Term Goals: Week 1:  PT Short Term Goal 1 (Week 1): =LTG due to estimated LOS  Pt missed skilled PT due to pt refusal as received high dosage of Lasix and felt that would begin frequent void soon. Pt verbalized "I know this means I might be stuck here an extra day or so". Will continue efforts.      Therapy Documentation Precautions:  Precautions Precautions: Fall Restrictions Weight Bearing Restrictions: Yes RLE Weight Bearing: Weight bearing as tolerated General: PT Amount of Missed Time (min): 60 Minutes PT Missed Treatment Reason: Patient unwilling to participate;Other (Comment)    Therapy/Group: Individual Therapy  Duwane Gewirtz  Nellene Courtois, PTA  08/02/2018, 10:59 AM

## 2018-08-03 ENCOUNTER — Inpatient Hospital Stay (HOSPITAL_COMMUNITY): Payer: Managed Care, Other (non HMO)

## 2018-08-03 ENCOUNTER — Inpatient Hospital Stay (HOSPITAL_COMMUNITY): Payer: Managed Care, Other (non HMO) | Admitting: Physical Therapy

## 2018-08-03 ENCOUNTER — Inpatient Hospital Stay (HOSPITAL_COMMUNITY): Payer: Managed Care, Other (non HMO) | Admitting: Occupational Therapy

## 2018-08-03 LAB — BASIC METABOLIC PANEL
Anion gap: 10 (ref 5–15)
BUN: 23 mg/dL — ABNORMAL HIGH (ref 6–20)
CO2: 39 mmol/L — ABNORMAL HIGH (ref 22–32)
Calcium: 8.3 mg/dL — ABNORMAL LOW (ref 8.9–10.3)
Chloride: 94 mmol/L — ABNORMAL LOW (ref 98–111)
Creatinine, Ser: 1.52 mg/dL — ABNORMAL HIGH (ref 0.61–1.24)
GFR calc Af Amer: >60 mL/min (ref >60)
GFR calc non Af Amer: 57 mL/min — ABNORMAL LOW (ref >60)
Glucose, Bld: 95 mg/dL (ref 70–99)
Potassium: 4.1 mmol/L (ref 3.5–5.1)
Sodium: 143 mmol/L (ref 135–145)

## 2018-08-03 LAB — PROTIME-INR
INR: 2.75
Prothrombin Time: 28.7 seconds — ABNORMAL HIGH (ref 11.4–15.2)

## 2018-08-03 MED ORDER — WARFARIN SODIUM 2.5 MG PO TABS
3.7500 mg | ORAL_TABLET | Freq: Once | ORAL | Status: AC
  Administered 2018-08-03: 3.75 mg via ORAL
  Filled 2018-08-03: qty 1

## 2018-08-03 NOTE — Patient Care Conference (Signed)
Inpatient RehabilitationTeam Conference and Plan of Care Update Date: 08/04/2018   Time: 8:47 AM    Patient Name: Mark Baker      Medical Record Number: [id number]  Date of Birth: [birth year] Sex: Male         Room/Bed: 4M07C/4M07C-01 Payor Info: Payor: [employer] / Plan: Market researcher / Product Type: *No Product type* /    Admitting Diagnosis: Debility  Admit Date/Time:  07/30/2018  5:22 PM Admission Comments: No comment available   Primary Diagnosis:  Debility Principal Problem: Debility  Patient Active Problem List   Diagnosis Date Noted  . Acute blood loss anemia   . CHF (congestive heart failure) ([place])   . Congestion of upper respiratory tract   . Debility 07/30/2018  . AKI (acute kidney injury) ([place])   . Supplemental oxygen dependent   . PAF (paroxysmal atrial fibrillation) ([place])   . Benign essential HTN   . Acute respiratory failure with hypoxia and hypercapnia ([place]) 07/25/2018  . Infection of right knee ([place]) 07/23/2018  . Rupture of right quadriceps muscle 06/10/2018  . Quadriceps tendon rupture, right, initial encounter 06/10/2018  . Vitamin D deficiency 12/10/2017  . Volume overload 11/25/2017  . Syncope 01/01/2017  . Erectile dysfunction 12/25/2016  . Encounter for therapeutic drug monitoring 11/19/2016  . Paroxysmal atrial fibrillation ([facility])   . Chronic HFrEF (heart failure with reduced ejection fraction) ([place]) 07/05/2016  . Morbid obesity with body mass index (BMI) of 60.0 to 69.9 in adult [facility]) 07/05/2016  . CKD (chronic kidney disease), stage III ([place]) 07/05/2016  . Hyperlipidemia 11/25/2012  . Prolonged QT interval 11/24/2012  . Healthcare maintenance 09/03/2012  . HTN (hypertension) 02/19/2012  . Sleep apnea 02/10/2012  . Morbid obesity ([place]) 02/10/2012    Expected Discharge Date: Expected Discharge Date: 08/04/18  Team Members Present: Physician leading conference: Dr. Delice Lesch Social Worker Present: Ovidio Kin, LCSW Nurse Present: Leonette Nutting, RN PT Present: Kennyth Lose, PT OT Present: Amy Rounds, OT SLP Present: Windell Moulding, SLP PPS Coordinator present : Daiva Nakayama, RN, CRRN;Melissa Gertie Fey     Current Status/Progress Goal Weekly Team Focus  Medical   Debility secondary to post op VDRF  Improve mobility, AKI  See above   Bowel/Bladder   Continent of bowel and bladder  Continue to be continent of bowel and bladder      Swallow/Nutrition/ Hydration             ADL's   Supervision overall using crutches. Total A for donning socks. Able to use AE for LB dressing to thread pants and doff socks  Supervision overall  OT goals met, pt to d/c home tomorrow at supervision level   Mobility   S overall; refuses stair training  S overall with crutches  activity tolerance, pt education   Communication             Safety/Cognition/ Behavioral Observations            Pain   Pain controls with medication  Pain free or less than 3      Skin   No skin issures  Continue to monitor incision. Incision clean dry and intact         *See Care Plan and progress notes for long and short-term goals.     Barriers to Discharge  Current Status/Progress Possible Resolutions Date Resolved   Physician    IV antibiotics;Medical stability;Weight     See above  Therapies, encourage fluids, making good progress  Nursing                  PT  Home environment access/layout  5 steps to enter home              OT Home environment access/layout;Decreased caregiver support  Bedroom and full bathroom on second level of home. Intermittent assist available at d/c             SLP                SW Other (comments) [employer] insurance rarely finds Home helath agency to take their patients            Discharge Planning/Teaching Needs:    Home with nephew and daughter, goals mod/i level.     Team Discussion:  Goals reached quickly and wants to go home. Medically stable and ready for DC tomorrow. Goals mod/i level  Revisions to  Treatment Plan:  DC 12/24    Continued Need for Acute Rehabilitation Level of Care: The patient requires daily medical management by a physician with specialized training in physical medicine and rehabilitation for the following conditions: Daily direction of a multidisciplinary physical rehabilitation program to ensure safe treatment while eliciting the highest outcome that is of practical value to the patient.: Yes Daily medical management of patient stability for increased activity during participation in an intensive rehabilitation regime.: Yes Daily analysis of laboratory values and/or radiology reports with any subsequent need for medication adjustment of medical intervention for : Post surgical problems;Wound care problems;Renal problems   I attest that I was present, lead the team conference, and concur with the assessment and plan of the team.   Elease Hashimoto 08/04/2018, 8:47 AM

## 2018-08-03 NOTE — Progress Notes (Signed)
[place] PHYSICAL MEDICINE & REHABILITATION PROGRESS NOTE  Subjective/Complaints: Patient seen sitting up in a chair this morning.  He states he slept well overnight.  He states he had a good weekend.  He states he wants to go home.  ROS: Denies CP, SOB, N/V/D.  Objective: Vital Signs: Blood pressure 134/84, pulse 76, temperature 97.8 F (36.6 C), temperature source Oral, resp. rate 19, height 6\' 3"  (1.905 m), weight (!) 235 kg, SpO2 100 %. No results found. No results for input(s): WBC, HGB, HCT, PLT in the last 72 hours. Recent Labs    08/02/18 0540 08/03/18 0546  NA 141 143  K 4.2 4.1  CL 93* 94*  CO2 39* 39*  GLUCOSE 103* 95  BUN 25* 23*  CREATININE 1.89* 1.52*  CALCIUM 8.6* 8.3*    Physical Exam: BP 134/84 (BP Location: Right Arm)   Pulse 76   Temp 97.8 F (36.6 C) (Oral)   Resp 19   Ht 6\' 3"  (1.905 m)   Wt (!) 235 kg   SpO2 100%   BMI 64.76 kg/m  Constitutional: No distress . Vital signs reviewed.  Morbid obesity. HENT: Normocephalic.  Atraumatic. Eyes: EOMI. No discharge. Cardiovascular: RRR.  No JVD. Respiratory: Normal effort.  Clear. GI: BS +. Non-distended. Musc: Right knee with some edema and tenderness.   Neurological: He is alertand oriented. Speech clearer and spontaneous.  Able to follow basic commands without difficulty. Motor: 5/5 throughout bilateral upper extremities, unchanged Right lower extremity: Hip flexion 2+/5, knee limited by dressing, ankle dorsiflexion 4/5 Left lower extremity: Hip flexion 4/5, knee extension 4/5, ankle dorsiflexion 5/5, stable Skin: Right knee with dressing C/D/I  Assessment/Plan: 1. Functional deficits secondary to debility which require 3+ hours per day of interdisciplinary therapy in a comprehensive inpatient rehab setting.  Physiatrist is providing close team supervision and 24 hour management of active medical problems listed below.  Physiatrist and rehab team continue to assess barriers to  discharge/monitor patient progress toward functional and medical goals  Care Tool:  Bathing    Body parts bathed by patient: Right arm, Left arm, Chest, Abdomen, Front perineal area, Buttocks, Right upper leg, Left upper leg, Face, Right lower leg, Left lower leg   Body parts bathed by helper: Right lower leg, Left lower leg     Bathing assist Assist Level: Set up assist(LH sponge)     Upper Body Dressing/Undressing Upper body dressing   What is the patient wearing?: Pull over shirt    Upper body assist Assist Level: Independent    Lower Body Dressing/Undressing Lower body dressing      What is the patient wearing?: Pants     Lower body assist Assist for lower body dressing: Independent     Toileting Toileting    Toileting assist Assist for toileting: Supervision/Verbal cueing Assistive Device Comment: (crutches)   Transfers Chair/bed transfer  Transfers assist     Chair/bed transfer assist level: Supervision/Verbal cueing     Locomotion Ambulation   Ambulation assist      Assist level: Contact Guard/Touching assist Assistive device: Crutches Max distance: 30'   Walk 10 feet activity   Assist     Assist level: Contact Guard/Touching assist     Walk 50 feet activity   Assist Walk 50 feet with 2 turns activity did not occur: Safety/medical concerns(fatigue)         Walk 150 feet activity   Assist Walk 150 feet activity did not occur: Safety/medical concerns  Walk 10 feet on uneven surface  activity   Assist Walk 10 feet on uneven surfaces activity did not occur: Safety/medical concerns         Wheelchair     Assist Will patient use wheelchair at discharge?: No             Wheelchair 50 feet with 2 turns activity    Assist            Wheelchair 150 feet activity     Assist            Medical Problem List and Plan: 1. Debility secondary to post op VDRF  Continue CIR  Weekend notes  reviewed- no acute issues  Plan for d/c tomorrow  Will see patient for transitional care management in 1-2 weeks post-discharge 2. DVT Prophylaxis/Anticoagulation: Pharmaceutical:Coumadin  INR therapeutic on 12/23 3. Pain Management:Tylenol as needed 4. Mood:LCSW to follow for evaluation and support 5. Neuropsych: This patientiscapable of making decisions on hisown behalf. 6. Skin/Wound Care:Routine pressure relief measures 7. Fluids/Electrolytes/Nutrition:Monitor I's and O's.  8. CM with combined FXJ:OITGP weights daily and monitor for signs of overload. Continue Demadex, Imdur, Apresoline and Lipitor Filed Weights   07/30/18 1758  Weight: (!) 235 kg  9.Acute on chronic QDI:YMEBRAXE to monitor for trend. Baseline SCr 1.4-1.5 range?   Creatinine 1.52 on 12/23  Continue to monitor 10. HTN:On Imdur, Demadex and Apresoline monitor blood pressure twice daily.  Relatively controlled on 12/23  Monitor with increased mobility 11. Acute on chronic respiratory failure/OSA: Non-compliant with BIPAP. Continue to monitor respiratory status.  Chest x-ray showing CHF, symptomatically improved. 12. Morbid obesity:Educated patient on heart healthy diet as well as importance of weight loss to help promote mobility and overall health. 13. A NMM:HWKGSUP heart rate twice daily. On amiodarone and Coreg twice daily as well as digoxin daily. Continue Coumadin 14.  Acute blood loss anemia  Hemoglobin 10.8 on 12/20  Continue to monitor 15.  Congestion  Flutter valve   Improved  LOS: 4 days A FACE TO FACE EVALUATION WAS PERFORMED  Ankit Karis Juba 08/03/2018, 9:56 AM

## 2018-08-03 NOTE — Progress Notes (Signed)
After reviewing the order and talking with patient changed patients settings from auto-mode to bipap with IPAP set at 24cm and EPAP set at 12cm. Patient stated it felt comfortable and would continue to wear bipap.

## 2018-08-03 NOTE — Progress Notes (Signed)
Occupational Therapy Session Note  Patient Details  Name: Mark Baker MRN: [id number] Date of Birth: [birth year]  Today's Date: 08/03/2018 OT Individual Time: 0700-0800 OT Individual Time Calculation (min): 60 min    Short Term Goals: Week 1:  OT Short Term Goal 1 (Week 1): STG=LTG due to LOS  Skilled Therapeutic Interventions/Progress Updates:    Pt seen for OT ADL bathing/dressing session. PT received standing at [facility] upon arrival with NT present, just finished with toileting task. Pt agreeable to tx session and "doing whatever it takes to get out of here".  He ambulated throughout room with distant supervision-mod I level using crutches. Bathing/dressing completed from w/c level at sink as pt will do at d/c. Completed with set-up assist using dressing stick, LH sponge and reacher to assist. Pt requires assist to don socks as no wide sock aids available for use at this time. Pt has been provided with resources of how to order/obtain needed AE for ADLs. He stood to complete pericare/buttock hygiene, UE support and distant supervision- mod I. Following seated rest break, pt completed functional ambulation within hallway, ambulating ~49ft with supervision with standing rest breaks required throughout. Pt returned to recliner at end of session, left seated with all needs in reach and set-up with breakfast tray.  Discussion throughout session regarding d/c planning, pt very much wanting to leave tomorrow, near goal level for OT, however, has had limited participation with PT during rehab admission, will discuss d/c planning with medical team.   Therapy Documentation Precautions:  Precautions Precautions: Fall Restrictions Weight Bearing Restrictions: No RLE Weight Bearing: Weight bearing as tolerated Pain:   No/denies pain   Therapy/Group: Individual Therapy  Caelen Higinbotham L 08/03/2018, 6:32 AM

## 2018-08-03 NOTE — Progress Notes (Signed)
Physical Therapy Session Note  Patient Details  Name: Zahin Ohanlon MRN: [id number] Date of Birth: 1981-06-12  Today's Date: 08/03/2018 PT Individual Time: 1300-1400 PT Individual Time Calculation (min): 60 min   Short Term Goals: Week 1:  PT Short Term Goal 1 (Week 1): =LTG due to estimated LOS  Skilled Therapeutic Interventions/Progress Updates:   Pt dozing in recliner, with CPAP.  Initially he declined tx, as he stated that Dr. Allena Katz told him that if he felt he would be able to do everthing at home, he did not need to do more PT.    PT discussed d/c plans iwht pt.  He now plans to d/c to his mother's house, which he initially staated has no steps to enter.  He later stated there might be 4-5 steps with 2 wooden rails.  Pt used urinal in sitting independently.  Pt donned shorts up past knees, in sitting, with use of crutch to open legs to slide onto feet.  He stated that he will not be using a reacher.  Sit> stand from recliner with supervision, and pulled up shorts with supervision..    Pt was eventually willing to practice steps.  In stairwell, pt declined practicing because he stated that his mother's railings are closer together, and he knew that he would rather use railings than crutches. PT transported pt to PT gym to use corner wooden steps.  PT unable to find wt limit on corner wooden stairs.  Similar steps in catalog have wt limit 500#.  Pt's last weight on 12/19 = 517 #.  He disputed that, stating that a weight taken in the bed would not be accurate.  Pt stated that he is fully confident that he can use the bil railings at his mother's , because he and his father built them.  He does not know wt limit.  Pt then stated that he might use crutches on his mother's steps, after all.  Pt pointed out that weight limit is not known on curb/step that PT offered to try for 1 step with crutches.  At this point, pt very frustrated, and declined attempting stairwell steps with crutches.  Gait  training with crutches on level tile, x 25' with supervision, to sit in recliner.  Seated 2 x 15 each : R knee flexion/extension via heel slides with foot on towel, with PT holding wt of RLE off of floor with towel, R hip adduction/abduction, bil ankle pumps, glut sets.  PT educated pt on use of calf muscles during ankle pumps to assist in moving fluid in bil LEs, bil glut sets.    Pt advised pt to elevate bil LEs whenever he is in recliner to assist with edema mgt.  Pt left resting in recliner with leg rest elevated, needs at hand.     Therapy Documentation Precautions:  Precautions Precautions: Fall Required Braces or Orthoses: Other Brace Other Brace: ace wrap only R LE Restrictions Weight Bearing Restrictions: No RLE Weight Bearing: Weight bearing as tolerated  Pain: pt denies           Therapy/Group: Individual Therapy  Raynold Blankenbaker 08/03/2018, 4:31 PM

## 2018-08-03 NOTE — Progress Notes (Signed)
Social Work  Discharge Note  The overall goal for the admission was met for:   Discharge location: Yes-HOME WITH NEPHEW AND HIS DAUGHTER-AT TIMES WILL BE ALONE  Length of Stay: Yes-6 DAYS  Discharge activity level: Yes-MOD/I LEVEL  Home/community participation: Yes  Services provided included: MD, RD, PT, OT, RN, CM, TR, Pharmacy and SW  Financial Services: Private Insurance: [employer]  Follow-up services arranged: Outpatient: [facility] ON [address]-12/30 @ 10:15-11;00  Comments (or additional information):REACHED GOALS AND WANTS TO GO HOME.  Patient/Family verbalized understanding of follow-up arrangements: Yes  Individual responsible for coordination of the follow-up plan: SELF  Confirmed correct DME delivered: Elease Hashimoto 08/03/2018    Elease Hashimoto

## 2018-08-03 NOTE — Progress Notes (Signed)
Social Work Patient ID: Mark Baker, male   DOB: [birth year], 37 y.o.   MRN: [id number] Pt wants to go home tomorrow and MD and team in agreement with. Discussed needs to participate in therapies today. Agreeable to OP therapy will make referral.

## 2018-08-03 NOTE — Progress Notes (Signed)
ANTICOAGULATION CONSULT NOTE - Follow-Up Consult  Pharmacy Consult for warfarin Indication: history of atrial fibrillation  Allergies  Allergen Reactions  . Aspirin Hives, Itching and Rash  . Entresto [Sacubitril-Valsartan] Cough    Was on Entresto 07/2016-09/2016    Patient Measurements: Height: 6\' 3"  (190.5 cm) Weight: (!) 518 lb 1.3 oz (235 kg) IBW/kg (Calculated) : 84.5  Vital Signs: Temp: 97.8 F (36.6 C) (12/23 0350) Temp Source: Oral (12/23 0350) BP: 134/84 (12/23 0350) Pulse Rate: 76 (12/23 0351)  Labs: Recent Labs    08/01/18 0500 08/02/18 0540 08/03/18 0546  LABPROT 29.5* 26.4* 28.7*  INR 2.86 2.47 2.75  CREATININE  --  1.89* 1.52*    Estimated Creatinine Clearance: 134.9 mL/min (A) (by C-G formula based on SCr of 1.52 mg/dL (H)).  Assessment: 26 yoM with morbidy obesity and on Coumadin 3.75mg  except 7.5mg  on MWF PTA for PAF. Pharmacy has been consulted to dose warfarin.   This morning, INR trending down but remains therapeutic at 2.75. Patient also continues on PTA Amiodarone 200 mg twice daily, so not a new drug interaction. However, cefazolin (started 12/12) has interaction with warfarin and may increase INR. Last CBC on 12/20 low but stable, and no documented bleeding. Ancef is to be continued for 4 wks so stop date is 08/20/18. Will add to order.   Ancef 12/12>>1/9  Goal of Therapy:  INR 2-3 Monitor platelets by anticoagulation protocol: Yes   Plan:  Warfarin 3.75 mg PO x1  Monitor daily INR, CBC, s/sx of bleed   Ulyses Southward, PharmD, BCIDP, AAHIVP, CPP Infectious Disease Pharmacist 08/03/2018 8:49 AM

## 2018-08-03 NOTE — Progress Notes (Signed)
Physical Therapy Discharge Summary  Patient Details  Name: Mark Baker MRN: [id number] Date of Birth: [birth year]  Today's Date: 08/03/2018 PT Individual Time: 1415-1500 PT Individual Time Calculation (min): 45 min    Patient has met 7 of 8 long term goals due to improved activity tolerance, improved balance, improved postural control, increased strength, ability to compensate for deficits and functional use of  right lower extremity.  Patient to discharge at an ambulatory level Modified Independent.   Patient's care partner is independent to provide the necessary physical assistance at discharge; reports at baseline since previous surgery 6 weeks ago. No formal family education completed, however pt reports nephew and daughter able to provide assist at current level as they were before this admission.   Reasons goals not met: Pt refused stair training; reports he is confident he can ascend/descend steps as he did before admission, during the past 6 weeks since his surgery  Recommendation:  Patient will benefit from ongoing skilled PT services in outpatient setting to continue to advance safe functional mobility, address ongoing impairments in strength, balance, endurance, and minimize fall risk.  Equipment: No equipment provided  Reasons for discharge: treatment goals met and discharge from hospital  Patient/family agrees with progress made and goals achieved: Yes  PT Discharge Precautions/Restrictions Precautions Precautions: Fall Required Braces or Orthoses: Other Brace Other Brace: ace wrap only R LE Restrictions Weight Bearing Restrictions: No RLE Weight Bearing: Weight bearing as tolerated Pain  Denies pain Vision/Perception  Perception Perception: Within Functional Limits Praxis Praxis: Intact  Cognition Overall Cognitive Status: Within Functional Limits for tasks assessed Arousal/Alertness: Awake/alert Orientation Level: Oriented X4 Memory: Appears  intact Awareness: Appears intact Problem Solving: Appears intact Safety/Judgment: Appears intact Sensation Sensation Light Touch: Appears Intact Coordination Gross Motor Movements are Fluid and Coordinated: No Fine Motor Movements are Fluid and Coordinated: Yes Coordination and Movement Description: Impaired due to generalized weakness and debility Motor  Motor Motor: Within Functional Limits;Other (comment) Motor - Skilled Clinical Observations: Limited by generalized weakness and debility  Mobility Bed Mobility Bed Mobility: Sit to Supine;Supine to Sit Supine to Sit: Supervision/Verbal cueing Sit to Supine: Supervision/Verbal cueing Transfers Transfers: Sit to Stand;Stand Pivot Transfers Sit to Stand: Supervision/Verbal cueing Stand Pivot Transfers: Supervision/Verbal cueing Transfer (Assistive device): Crutches Locomotion  Gait Ambulation: Yes Gait Assistance: Supervision/Verbal cueing Gait Distance (Feet): 50 Feet Assistive device: Crutches Gait Assistance Details: Verbal cues for precautions/safety Gait Gait: Yes Gait Pattern: Impaired Gait Pattern: Poor foot clearance - left;Poor foot clearance - right;Wide base of support;Right foot flat;Left foot flat;Decreased stride length;Decreased stance time - right Gait velocity: reduced Stairs / Additional Locomotion Stairs: No(pt refused) Wheelchair Mobility Wheelchair Mobility: No  Trunk/Postural Assessment  Cervical Assessment Cervical Assessment: Within Functional Limits Thoracic Assessment Thoracic Assessment: Within Functional Limits Lumbar Assessment Lumbar Assessment: Within Functional Limits Postural Control Postural Control: Within Functional Limits  Balance Balance Balance Assessed: Yes Static Sitting Balance Static Sitting - Balance Support: Feet supported;No upper extremity supported Static Sitting - Level of Assistance: 7: Independent Dynamic Sitting Balance Dynamic Sitting - Balance Support:  During functional activity Dynamic Sitting - Level of Assistance: 6: Modified independent (Device/Increase time) Static Standing Balance Static Standing - Balance Support: During functional activity;Right upper extremity supported;Left upper extremity supported Static Standing - Level of Assistance: 6: Modified independent (Device/Increase time) Dynamic Standing Balance Dynamic Standing - Balance Support: During functional activity;Right upper extremity supported;Left upper extremity supported Dynamic Standing - Level of Assistance: 6: Modified independent (Device/Increase time) Dynamic Standing - Comments: Standing  to complete LB bathing/dressing and toileting tasks Extremity Assessment  RUE Assessment RUE Assessment: Within Functional Limits LUE Assessment LUE Assessment: Within Functional Limits RLE Assessment RLE Assessment: Exceptions to [facility] Passive Range of Motion (PROM) Comments: [facility] General Strength Comments: Grossly 4/5 throughout LLE Assessment LLE Assessment: Within Functional Limits General Strength Comments: 4+/5 throughout  Skilled Therapeutic Intervention: Pt received seated in w/c, denies pain and agreeable to treatment however does require encouragement. Pt provided with HEP for LE strengthening and endurance; pt performed each exercise 2 sets 10-15 reps, with exception of sit <>stand x5 reps. Discussed home entry with pt; pt repeatedly reports he is not concerned about stairs, does not feel the need to practice, and is confident he can ambulate up/down steps as he was before this admission as he has been during the 6 weeks post-op since the original surgery. Pt with no further questions or concerns regarding d/c at this time; declines remainder of session d/t fatigue. Remained in recliner, all needs in reach.    Benjiman Core Seigle 08/03/2018, 2:05 PM

## 2018-08-03 NOTE — Progress Notes (Signed)
Occupational Therapy Discharge Summary  Patient Details  Name: Mark Baker MRN: [id number] Date of Birth: 1981/01/16  Patient has met 6 of 7 long term goals due to improved activity tolerance, improved balance, postural control and ability to compensate for deficits.  Patient to discharge at overall Supervision - mod I level. Pt requires assist to don B socks. Provided with resources to obtain wide sock aid he will require.  Patient's care partner is independent to provide the necessary physical assistance at discharge.  Pt reports that family will be able to provide any needed assist at d/c. No formal family education/training completed, however, pt reports being back close to base line level.  Pt completing sponge bathing at mod I level. Reports shower is not accessible within home and therefore it was not addressed during rehab admission. Pt using AE to assist with LB bathing/dressing and has been provided with resources regarding how to obtain AE in the community. Pt voices feeling comfortable and confident for d/c home, motivated to d/c home before Christmas and feels ready despite limited participation during rehab admission 2/2 multiple medical reasons.   Reasons goals not met: Wide sock aid not available on unit for pt to trial. Suspect he will be able to don socks with use of sock aid.   Recommendation:  Patient with no further OT needs at this time. Current deficits to be addressed by OPOT.   Equipment: Pt has all needed DME  Reasons for discharge: treatment goals met and discharge from hospital  Patient/family agrees with progress made and goals achieved: Yes  OT Discharge Precautions/Restrictions  Precautions Precautions: Fall Required Braces or Orthoses: Other Brace Other Brace: ace wrap only R LE Restrictions Weight Bearing Restrictions: No RLE Weight Bearing: Weight bearing as tolerated Vision Baseline Vision/History: Wears glasses Wears Glasses: At all times Patient  Visual Report: No change from baseline Vision Assessment?: No apparent visual deficits Perception  Perception: Within Functional Limits Praxis Praxis: Intact Cognition Overall Cognitive Status: Within Functional Limits for tasks assessed Arousal/Alertness: Awake/alert Orientation Level: Oriented X4 Memory: Appears intact Awareness: Appears intact Problem Solving: Appears intact Safety/Judgment: Appears intact Sensation Sensation Light Touch: Appears Intact Coordination Gross Motor Movements are Fluid and Coordinated: No Fine Motor Movements are Fluid and Coordinated: Yes Coordination and Movement Description: Impaired due to generalized weakness and debility Motor  Motor Motor: Within Functional Limits;Other (comment) Motor - Skilled Clinical Observations: Limited by generalized weakness and debility  Trunk/Postural Assessment  Cervical Assessment Cervical Assessment: Within Functional Limits Thoracic Assessment Thoracic Assessment: Within Functional Limits Lumbar Assessment Lumbar Assessment: Within Functional Limits Postural Control Postural Control: Within Functional Limits  Balance Balance Balance Assessed: Yes Static Sitting Balance Static Sitting - Balance Support: Feet supported;No upper extremity supported Static Sitting - Level of Assistance: 7: Independent Dynamic Sitting Balance Dynamic Sitting - Balance Support: During functional activity Dynamic Sitting - Level of Assistance: 6: Modified independent (Device/Increase time) Static Standing Balance Static Standing - Balance Support: During functional activity;Right upper extremity supported;Left upper extremity supported Static Standing - Level of Assistance: 6: Modified independent (Device/Increase time) Dynamic Standing Balance Dynamic Standing - Balance Support: During functional activity;Right upper extremity supported;Left upper extremity supported Dynamic Standing - Level of Assistance: 6: Modified  independent (Device/Increase time) Dynamic Standing - Comments: Standing to complete LB bathing/dressing and toileting tasks Extremity/Trunk Assessment RUE Assessment RUE Assessment: Within Functional Limits LUE Assessment LUE Assessment: Within Functional Limits   Deyanira Fesler L 08/03/2018, 12:46 PM

## 2018-08-03 NOTE — Progress Notes (Signed)
Placed patient on CPAP for the night via auto mode with minimum pressure set at 12cm and maximum pressure set at 24cm.

## 2018-08-04 ENCOUNTER — Inpatient Hospital Stay (HOSPITAL_COMMUNITY): Payer: Managed Care, Other (non HMO) | Admitting: Occupational Therapy

## 2018-08-04 LAB — PROTIME-INR
INR: 2.49
Prothrombin Time: 26.6 seconds — ABNORMAL HIGH (ref 11.4–15.2)

## 2018-08-04 MED ORDER — CARVEDILOL 6.25 MG PO TABS: 6.2500 mg | ORAL_TABLET | Freq: Two times a day (BID) | ORAL | 0 refills | Status: DC

## 2018-08-04 MED ORDER — POTASSIUM CHLORIDE ER 20 MEQ PO TBCR: 20.0000 meq | EXTENDED_RELEASE_TABLET | Freq: Every day | ORAL | Status: DC | PRN

## 2018-08-04 MED ORDER — ISOSORBIDE MONONITRATE ER 60 MG PO TB24: 60.0000 mg | ORAL_TABLET | Freq: Every day | ORAL | 0 refills | Status: DC

## 2018-08-04 MED ORDER — ATORVASTATIN CALCIUM 40 MG PO TABS: 40.0000 mg | ORAL_TABLET | Freq: Every day | ORAL | 0 refills | Status: DC

## 2018-08-04 MED ORDER — DIGOXIN 250 MCG PO TABS: 0.2500 mg | ORAL_TABLET | Freq: Every day | ORAL | 0 refills | Status: DC

## 2018-08-04 MED ORDER — WARFARIN SODIUM 4 MG PO TABS: 4.0000 mg | ORAL_TABLET | Freq: Every day | ORAL | 0 refills | Status: DC

## 2018-08-04 MED ORDER — TORSEMIDE 20 MG PO TABS: ORAL_TABLET | ORAL | 0 refills | Status: DC

## 2018-08-04 MED ORDER — AMIODARONE HCL 200 MG PO TABS: 200.0000 mg | ORAL_TABLET | Freq: Two times a day (BID) | ORAL | 0 refills | Status: DC

## 2018-08-04 MED ORDER — WARFARIN SODIUM 2 MG PO TABS: 4.0000 mg | ORAL_TABLET | Freq: Every day | ORAL | Status: DC

## 2018-08-04 MED ORDER — ACETAMINOPHEN 325 MG PO TABS: 325.0000 mg | ORAL_TABLET | ORAL | Status: DC | PRN

## 2018-08-04 MED ORDER — SPIRONOLACTONE 25 MG PO TABS: 25.0000 mg | ORAL_TABLET | Freq: Every day | ORAL | 0 refills | Status: DC

## 2018-08-04 MED ORDER — DOCUSATE SODIUM 100 MG PO CAPS: 100.0000 mg | ORAL_CAPSULE | Freq: Two times a day (BID) | ORAL | 0 refills | Status: DC

## 2018-08-04 MED ORDER — CLINDAMYCIN HCL 300 MG PO CAPS: 600.0000 mg | ORAL_CAPSULE | Freq: Three times a day (TID) | ORAL | 0 refills | Status: AC

## 2018-08-04 MED ORDER — CEPHALEXIN 500 MG PO CAPS: 500.0000 mg | ORAL_CAPSULE | Freq: Four times a day (QID) | ORAL | 0 refills | Status: DC

## 2018-08-04 MED ORDER — HYDRALAZINE HCL 50 MG PO TABS: 50.0000 mg | ORAL_TABLET | Freq: Three times a day (TID) | ORAL | 0 refills | Status: DC

## 2018-08-04 NOTE — Discharge Summary (Addendum)
Physician Discharge Summary  Patient ID: Mark Baker MRN: [id number] DOB/AGE: 38-28-1982 38 y.o.  Admit date: 07/30/2018 Discharge date: 08/04/2018  Discharge Diagnoses:  Principal Problem:   Debility Active Problems:   Morbid obesity (HCC)   CKD (chronic kidney disease), stage III (HCC)   Infection of right knee (HCC)   PAF (paroxysmal atrial fibrillation) (HCC)   Acute blood loss anemia   CHF (congestive heart failure) (HCC)   Congestion of upper respiratory tract   Discharged Condition: stable   Significant Diagnostic Studies: Dg Chest 2 View  Result Date: 07/31/2018 CLINICAL DATA:  Postoperative radiograph following incision and drainage of a right knee wound. History of CHF EXAM: CHEST - 2 VIEW COMPARISON:  Portable chest x-ray of July 26, 2018 FINDINGS: The cardiac silhouette remains enlarged. The central pulmonary vascularity is engorged and the more peripheral pulmonary vascularity is engorged and indistinct. There is no pleural effusion. The trachea is midline. The bony thorax exhibits no acute abnormality. IMPRESSION: CHF with mild interstitial edema more conspicuous than on the previous study. Enlargement of the cardiac silhouette may reflect CHF but a pericardial effusion is not excluded. Electronically Signed   By: David  [place] M.D.   On: 07/31/2018 10:40   US Renal  Result Date: 07/26/2018 CLINICAL DATA:  Acute renal insufficiency. EXAM: RENAL / URINARY TRACT ULTRASOUND COMPLETE COMPARISON:  None. FINDINGS: Right Kidney: Renal measurements: 12.3 x 8 x 7.6 cm = volume: 392.1 mL. Limited evaluation. Not well visualized. Left Kidney: Renal measurements: 11.5 x 7.3 x 9.5 cm = volume: 414.9 mL. Not well visualized. Limited evaluation. Bladder: Not visualized. IMPRESSION: 1. Severely limited study due to patient body habitus. No gross renal abnormalities noted on limited images. Electronically Signed   By: Gerome Sam III M.D   On: 07/26/2018 14:57    Labs:  Basic  Metabolic Panel: Recent Labs  Lab 07/29/18 0231 07/30/18 0235 07/31/18 0622 08/02/18 0540 08/03/18 0546  NA 136 137 140 141 143  K 3.8 3.8 4.1 4.2 4.1  CL 89* 92* 92* 93* 94*  CO2 35* 37* 40* 39* 39*  GLUCOSE 100* 120* 101* 103* 95  BUN 50* 42* 33* 25* 23*  CREATININE 2.66* 2.12* 2.01* 1.89* 1.52*  CALCIUM 8.2* 8.5* 8.7* 8.6* 8.3*  MG 2.4 2.3  --   --   --     CBC: Recent Labs  Lab 07/29/18 0231 07/31/18 0622  WBC 8.5 7.6  NEUTROABS  --  5.6  HGB 10.6* 10.8*  HCT 37.3* 38.8*  MCV 91.4 91.7  PLT 313 306   Lab Results  Component Value Date   INR 2.49 08/04/2018   INR 2.75 08/03/2018   INR 2.47 08/02/2018   CBG: No results for input(s): GLUCAP in the last 168 hours.  Brief HPI:   Mark Baker is a 37 year old male with history of morbid obesity, OSA, PAF, HTN CKD, combined CHF, recent right quad tendon repair 6 weeks PTA on 07/24/18 with post op infection with purulent drainage from wound. He underwent I & D right knee by Dr. Aundria Rud and post op on IV antibiotics as well as IV heparin. Post op course complicated by acute on chronic respiratory failure, acute on chronic renal failure with decrease in UOP as well as somnolence. Respiratory status improved with BIPAP and medical issues resolving. CIR recommended due to functional decline.    Hospital Course: Mark Baker was admitted to rehab 07/30/2018 for inpatient therapies to consist of PT and OT at least three  hours five days a week. Past admission physiatrist, therapy team and rehab RN have worked together to provide customized collaborative inpatient rehab. Serial check of BMET showed that acute on chronic renal failure is improving with SCr down to 1.52. Blood pressures are controlled on Imdur, Demadex and Apresoline.  A fib is controlled on amiodarone and coreg. Coumadin has been managed by pharmacy and INR is therapeutic on 4 mg daily. He was advised to have protime rechecked on next Monday 12/30 at [facility] coumadin clinic.    He has been compliant with use of home CPAP and respiratory status has been stable. He is continent of bowel and bladder. Pain is controlled with use of tylenol prn and drainage right knee has almost resolved. Minimal serous drainage noted at lower aspect on incision as well as prior drain site. Staples remain intact and he is to follow up with Ortho in 3-5 days for staple removal. Ace wraps were used to help mange edema of right thigh and knee. He has been educated on importance of elevating BLE and maintaining low salt diet to help with edema control. He was maintained on IV antibiotics during his stay and antibiotics narrowed to clindamycin 600 mg tid per input from ortho. He has had improvement in endurance and activity tolerance and was modified independent to supervision level. He will continue to receive outpatient PT after discharge.    Rehab course: During patient's stay in rehab team conference was held to monitor patient's progress, set goals and discuss barriers to discharge. At admission, patient required min assist with mobility and self care tasks.  He  has had improvement in activity tolerance, balance, postural control as well as ability to compensate for deficits. He is able to complete ADL tasks at modified independent to supervision level.  He requires supervision with transfers and to ambulate 30' with crutches as well as cues for foot clearance/safety. Patient declined family education as he felt that he was at baseline.     Disposition:  Home   Diet: Heart Healthy/Low salt diet.   Special Instructions: 1. Apply compressive wraps from right mid thigh to lower shin to help with edema control. Keep legs elevated whenever seated.  2. Cleanse incision with normal saline and apply dry dressing. Contact MD if you develop any problems with your incision/wound--redness, swelling, increase in pain, drainage or if you develop fever or chills.    Discharge Instructions    Ambulatory  referral to Physical Medicine Rehab   Complete by:  As directed    1-12 weeks transitional care appt     Allergies as of 08/04/2018      Reactions   Aspirin Hives, Itching, Rash   Entresto [sacubitril-valsartan] Cough   Was on Entresto 07/2016-09/2016      Medication List    STOP taking these medications   cephALEXin 500 MG capsule Commonly known as:  KEFLEX   HYDROcodone-acetaminophen 5-325 MG tablet Commonly known as:  NORCO/VICODIN   KEFLEX 500 MG capsule Generic drug:  cephALEXin   losartan 25 MG tablet Commonly known as:  COZAAR   oxyCODONE 5 MG immediate release tablet Commonly known as:  ROXICODONE   sildenafil 100 MG tablet Commonly known as:  VIAGRA     TAKE these medications   acetaminophen 325 MG tablet Commonly known as:  TYLENOL Take 1-2 tablets (325-650 mg total) by mouth every 4 (four) hours as needed for mild pain. What changed:    medication strength  how much to take  when to take this  reasons to take this   amiodarone 200 MG tablet Commonly known as:  PACERONE Take 1 tablet (200 mg total) by mouth 2 (two) times daily.   atorvastatin 40 MG tablet Commonly known as:  LIPITOR Take 1 tablet (40 mg total) by mouth daily.   carvedilol 6.25 MG tablet Commonly known as:  COREG Take 1 tablet (6.25 mg total) by mouth 2 (two) times daily. What changed:    medication strength  how much to take   clindamycin 300 MG capsule Commonly known as:  CLEOCIN Take 2 capsules (600 mg total) by mouth 3 (three) times daily. What changed:  how much to take Notes to patient:  Take at 7 am, 2 pm and 10 pm   digoxin 0.25 MG tablet Commonly known as:  LANOXIN Take 1 tablet (0.25 mg total) by mouth daily.   docusate sodium 100 MG capsule Commonly known as:  COLACE Take 1 capsule (100 mg total) by mouth 2 (two) times daily.   hydrALAZINE 50 MG tablet Commonly known as:  APRESOLINE Take 1 tablet (50 mg total) by mouth every 8 (eight) hours.    isosorbide mononitrate 60 MG 24 hr tablet Commonly known as:  IMDUR Take 1 tablet (60 mg total) by mouth daily.   Potassium Chloride ER 20 MEQ Tbcr Take 20 mEq by mouth daily as needed (cramping).   spironolactone 25 MG tablet Commonly known as:  ALDACTONE Take 1 tablet (25 mg total) by mouth daily. What changed:  how much to take   torsemide 20 MG tablet Commonly known as:  DEMADEX Take 60 mg (3 tablets) daily in am. What changed:  additional instructions   warfarin 4 MG tablet Commonly known as:  COUMADIN Take as directed. If you are unsure how to take this medication, talk to your nurse or doctor. Original instructions:  Take 1 tablet (4 mg total) by mouth daily at 6 PM. What changed:    medication strength  how much to take      Follow-up Information    Sherren MochaShaw, Eva N, MD Follow up on 08/26/2018.   Specialty:  Family Medicine Why:  Appointment @ 9:00 AM Contact information: [address] [place] [address] [phone]        Bensimhon, Bevelyn Bucklesaniel R, MD .   Specialty:  Cardiology Contact information: [address] [place] [address] [phone]           Signed: Jacquelynn Creeamela S Love 08/04/2018, 2:30 PM   Patient seen and examined by me on day of discharge. Maryla MorrowAnkit Patel, MD, ABPMR

## 2018-08-04 NOTE — Progress Notes (Signed)
Called Dr. Aundria Rud office for clarification of antibiotics IV v/s oral.  Clinic called to recommend discharging patient on clindamycin 600 mg tid for 4 weeks.

## 2018-08-04 NOTE — Progress Notes (Signed)
Patient discharged home with nephew. No questions or complaints at this time.

## 2018-08-04 NOTE — Discharge Instructions (Signed)
Inpatient Rehab Discharge Instructions  Mark Baker Discharge date and time: 08/04/18   Activities/Precautions/ Functional Status: Activity: no lifting, driving, or strenuous exercise till cleared by MD Diet: cardiac diet Low carb diet/Low salt Wound Care: keep wound clean and dry Contact orthopedist if you develop any problems with your incision/wound--redness, swelling, increase in pain, drainage or if you develop fever or chills.    Functional status:  ___ No restrictions     ___ Walk up steps independently _X__ 24/7 supervision/assistance   ___ Walk up steps with assistance ___ Intermittent supervision/assistance  ___ Bathe/dress independently ___ Walk with walker     ___ Bathe/dress with assistance ___ Walk Independently    ___ Shower independently _X__ Walk with supervision       __X_ Shower with supervision.  _X__ No alcohol     ___ Return to work/school ________   Special Instructions: 1. Keeps legs elevated when seated.  2. Ace wrap right leg from mid thigh to mid shin to help with swelling of the knee. 3. Note change in antibiotics.  4. Will need to get protime rechecked on Friday or Monday.    My questions have been answered and I understand these instructions. I will adhere to these goals and the provided educational materials after my discharge from the hospital.  Patient/Caregiver Signature _______________________________ Date __________  Clinician Signature _______________________________________ Date __________  Please bring this form and your medication list with you to all your follow-up doctor's appointments. Information on my medicine - Coumadin   (Warfarin)  This medication education was reviewed with me or my healthcare representative as part of my discharge preparation.    Why was Coumadin prescribed for you? Coumadin was prescribed for you because you have a blood clot or a medical condition that can cause an increased risk of forming blood clots.  Blood clots can cause serious health problems by blocking the flow of blood to the heart, lung, or brain. Coumadin can prevent harmful blood clots from forming. As a reminder your indication for Coumadin is:   Stroke Prevention Because Of Atrial Fibrillation  What test will check on my response to Coumadin? While on Coumadin (warfarin) you will need to have an INR test regularly to ensure that your dose is keeping you in the desired range. The INR (international normalized ratio) number is calculated from the result of the laboratory test called prothrombin time (PT).  If an INR APPOINTMENT HAS NOT ALREADY BEEN MADE FOR YOU please schedule an appointment to have this lab work done by your health care provider within 7 days. Your INR goal is usually a number between:  2 to 3 or your provider may give you a more narrow range like 2-2.5.  Ask your health care provider during an office visit what your goal INR is.  What  do you need to  know  About  COUMADIN? Take Coumadin (warfarin) exactly as prescribed by your healthcare provider about the same time each day.  DO NOT stop taking without talking to the doctor who prescribed the medication.  Stopping without other blood clot prevention medication to take the place of Coumadin may increase your risk of developing a new clot or stroke.  Get refills before you run out.  What do you do if you miss a dose? If you miss a dose, take it as soon as you remember on the same day then continue your regularly scheduled regimen the next day.  Do not take two doses of Coumadin at  the same time.  Important Safety Information A possible side effect of Coumadin (Warfarin) is an increased risk of bleeding. You should call your healthcare provider right away if you experience any of the following: ? Bleeding from an injury or your nose that does not stop. ? Unusual colored urine (red or dark brown) or unusual colored stools (red or black). ? Unusual bruising for unknown  reasons. ? A serious fall or if you hit your head (even if there is no bleeding).  Some foods or medicines interact with Coumadin (warfarin) and might alter your response to warfarin. To help avoid this: ? Eat a balanced diet, maintaining a consistent amount of Vitamin K. ? Notify your provider about major diet changes you plan to make. ? Avoid alcohol or limit your intake to 1 drink for women and 2 drinks for men per day. (1 drink is 5 oz. wine, 12 oz. beer, or 1.5 oz. liquor.)  Make sure that ANY health care provider who prescribes medication for you knows that you are taking Coumadin (warfarin).  Also make sure the healthcare provider who is monitoring your Coumadin knows when you have started a new medication including herbals and non-prescription products.  Coumadin (Warfarin)  Major Drug Interactions  Increased Warfarin Effect Decreased Warfarin Effect  Alcohol (large quantities) Antibiotics (esp. Septra/Bactrim, Flagyl, Cipro) Amiodarone (Cordarone) Aspirin (ASA) Cimetidine (Tagamet) Megestrol (Megace) NSAIDs (ibuprofen, naproxen, etc.) Piroxicam (Feldene) Propafenone (Rythmol SR) Propranolol (Inderal) Isoniazid (INH) Posaconazole (Noxafil) Barbiturates (Phenobarbital) Carbamazepine (Tegretol) Chlordiazepoxide (Librium) Cholestyramine (Questran) Griseofulvin Oral Contraceptives Rifampin Sucralfate (Carafate) Vitamin K   Coumadin (Warfarin) Major Herbal Interactions  Increased Warfarin Effect Decreased Warfarin Effect  Garlic Ginseng Ginkgo biloba Coenzyme Q10 Green tea St. Johns wort    Coumadin (Warfarin) FOOD Interactions  Eat a consistent number of servings per week of foods HIGH in Vitamin K (1 serving =  cup)  Collards (cooked, or boiled & drained) Kale (cooked, or boiled & drained) Mustard greens (cooked, or boiled & drained) Parsley *serving size only =  cup Spinach (cooked, or boiled & drained) Swiss chard (cooked, or boiled & drained) Turnip  greens (cooked, or boiled & drained)  Eat a consistent number of servings per week of foods MEDIUM-HIGH in Vitamin K (1 serving = 1 cup)  Asparagus (cooked, or boiled & drained) Broccoli (cooked, boiled & drained, or raw & chopped) Brussel sprouts (cooked, or boiled & drained) *serving size only =  cup Lettuce, raw (green leaf, endive, romaine) Spinach, raw Turnip greens, raw & chopped   These websites have more information on Coumadin (warfarin):  http://www.king-russell.com/www.coumadin.com; https://www.hines.net/www.ahrq.gov/consumer/coumadin.htm;  COMMUNITY REFERRALS UPON DISCHARGE:    Outpatient: PT  Agency:[facility] ON [address] Phone:[phone]   Date of Last Service:08/04/2018  Appointment Date/Time:DECEMBER 30 @ 10:15-11:00 AM  Medical Equipment/Items Ordered:NO NEEDS

## 2018-08-04 NOTE — Progress Notes (Signed)
ANTICOAGULATION CONSULT NOTE - Follow-Up Consult  Pharmacy Consult for warfarin Indication: history of atrial fibrillation  Allergies  Allergen Reactions  . Aspirin Hives, Itching and Rash  . Entresto [Sacubitril-Valsartan] Cough    Was on Entresto 07/2016-09/2016    Patient Measurements: Height: 6\' 3"  (190.5 cm) Weight: (!) 518 lb 1.3 oz (235 kg) IBW/kg (Calculated) : 84.5  Vital Signs: Temp: 97.7 F (36.5 C) (12/24 0446) Temp Source: Oral (12/24 0446) BP: 144/82 (12/24 0854) Pulse Rate: 80 (12/24 0854)  Labs: Recent Labs    08/02/18 0540 08/03/18 0546 08/04/18 0736  LABPROT 26.4* 28.7* 26.6*  INR 2.47 2.75 2.49  CREATININE 1.89* 1.52*  --     Estimated Creatinine Clearance: 134.9 mL/min (A) (by C-G formula based on SCr of 1.52 mg/dL (H)).  Assessment: Mark Baker with morbidy obesity and on Coumadin 3.75mg  except 7.5mg  on MWF PTA for PAF. Pharmacy has been consulted to dose warfarin.   This morning, INR trending down but remains therapeutic at 2.49. Seems to be stable around the 3.75mg  dose so we'll try a standing dose for now. Patient also continues on PTA Amiodarone 200 mg twice daily, so not a new drug interaction. However, cefazolin (started 12/12) has interaction with warfarin and may increase INR. Last CBC on 12/20 low but stable, and no documented bleeding.   Ancef is to be continued for 4 wks so stop date is 08/20/18.    Ancef 12/12>>1/9  Goal of Therapy:  INR 2-3 Monitor platelets by anticoagulation protocol: Yes   Plan:  Warfarin 4mg  qday  Monitor daily INR, CBC, s/sx of bleed  Ulyses Southward, PharmD, BCIDP, AAHIVP, CPP Infectious Disease Pharmacist 08/04/2018 9:04 AM

## 2018-08-06 ENCOUNTER — Telehealth (HOSPITAL_COMMUNITY): Payer: Self-pay | Admitting: Physical Medicine and Rehabilitation

## 2018-08-06 ENCOUNTER — Encounter: Payer: Self-pay | Admitting: Physical Medicine & Rehabilitation

## 2018-08-06 NOTE — Telephone Encounter (Signed)
Contacted [facility] coumadin clinic regarding protime draw for Monday 12/30. They reported that patient has been non-compliant and has not been seen there for months. They were able to set him up as new patient for Jan 3rd at 10:30 am. Patient contacted regarding date/time as well as importance of keeping appointment.

## 2018-08-10 ENCOUNTER — Ambulatory Visit: Payer: Managed Care, Other (non HMO) | Admitting: Physical Therapy

## 2018-08-11 LAB — BLOOD GAS, ARTERIAL
Acid-Base Excess: 7.9 mmol/L — ABNORMAL HIGH (ref 0.0–2.0)
Bicarbonate: 34 mmol/L — ABNORMAL HIGH (ref 20.0–28.0)
Delivery systems: POSITIVE
Drawn by: 347191
Expiratory PAP: 12
FIO2: 0.3
Inspiratory PAP: 24
O2 Saturation: 96.1 %
Patient temperature: 98.6
pCO2 arterial: 67.9 mmHg (ref 32.0–48.0)
pH, Arterial: 7.32 — ABNORMAL LOW (ref 7.350–7.450)
pO2, Arterial: 86.6 mmHg (ref 83.0–108.0)

## 2018-08-18 ENCOUNTER — Ambulatory Visit: Payer: Self-pay | Attending: Physical Medicine & Rehabilitation | Admitting: Physical Therapy

## 2018-08-21 ENCOUNTER — Encounter
Payer: Managed Care, Other (non HMO) | Attending: Physical Medicine & Rehabilitation | Admitting: Physical Medicine & Rehabilitation

## 2018-08-24 ENCOUNTER — Other Ambulatory Visit (HOSPITAL_COMMUNITY): Payer: Self-pay

## 2018-08-24 ENCOUNTER — Telehealth: Payer: Self-pay | Admitting: Family Medicine

## 2018-08-24 MED ORDER — HYDRALAZINE HCL 50 MG PO TABS: 50.0000 mg | ORAL_TABLET | Freq: Three times a day (TID) | ORAL | 1 refills | Status: DC

## 2018-08-24 MED ORDER — AMIODARONE HCL 200 MG PO TABS: 200.0000 mg | ORAL_TABLET | Freq: Two times a day (BID) | ORAL | 1 refills | Status: DC

## 2018-08-24 MED ORDER — TORSEMIDE 20 MG PO TABS: ORAL_TABLET | ORAL | 1 refills | Status: DC

## 2018-08-24 MED ORDER — DIGOXIN 250 MCG PO TABS: 0.2500 mg | ORAL_TABLET | Freq: Every day | ORAL | 1 refills | Status: DC

## 2018-08-24 MED ORDER — SPIRONOLACTONE 25 MG PO TABS: 25.0000 mg | ORAL_TABLET | Freq: Every day | ORAL | 1 refills | Status: DC

## 2018-08-24 MED ORDER — POTASSIUM CHLORIDE ER 20 MEQ PO TBCR: 20.0000 meq | EXTENDED_RELEASE_TABLET | Freq: Every day | ORAL | 1 refills | Status: DC | PRN

## 2018-08-24 MED ORDER — ATORVASTATIN CALCIUM 40 MG PO TABS: 40.0000 mg | ORAL_TABLET | Freq: Every day | ORAL | 1 refills | Status: DC

## 2018-08-24 MED ORDER — ISOSORBIDE MONONITRATE ER 60 MG PO TB24: 60.0000 mg | ORAL_TABLET | Freq: Every day | ORAL | 1 refills | Status: DC

## 2018-08-24 MED ORDER — CARVEDILOL 6.25 MG PO TABS: 6.2500 mg | ORAL_TABLET | Freq: Two times a day (BID) | ORAL | 1 refills | Status: DC

## 2018-08-24 NOTE — Telephone Encounter (Signed)
LVM for pt to call the office and reschedule the appt with Dr. Clelia Croft. Dr. Clelia Croft is out of the office with an injury for the next 6 weeks. Best case scenario, she will be returning the first week of March. When pt calls back, please ask pt if the appt I scheduled him for on 08/26/18 with Dr. Alvy Bimler at 4:00 will work for him. I did go ahead and schedule him so slot didn't get booked. If pt can't do that appt, please cancel appt and schedule for a hospital F/U at his convenience. Thank you!

## 2018-08-25 ENCOUNTER — Other Ambulatory Visit (HOSPITAL_COMMUNITY): Payer: Self-pay

## 2018-08-25 MED ORDER — HYDRALAZINE HCL 50 MG PO TABS: 50.0000 mg | ORAL_TABLET | Freq: Three times a day (TID) | ORAL | 1 refills | Status: DC

## 2018-08-25 MED ORDER — ATORVASTATIN CALCIUM 40 MG PO TABS: 40.0000 mg | ORAL_TABLET | Freq: Every day | ORAL | 1 refills | Status: DC

## 2018-08-25 MED ORDER — SPIRONOLACTONE 25 MG PO TABS: 25.0000 mg | ORAL_TABLET | Freq: Every day | ORAL | 1 refills | Status: DC

## 2018-08-25 MED ORDER — ISOSORBIDE MONONITRATE ER 60 MG PO TB24: 60.0000 mg | ORAL_TABLET | Freq: Every day | ORAL | 1 refills | Status: DC

## 2018-08-25 MED ORDER — DIGOXIN 250 MCG PO TABS: 0.2500 mg | ORAL_TABLET | Freq: Every day | ORAL | 1 refills | Status: DC

## 2018-08-25 MED ORDER — CARVEDILOL 6.25 MG PO TABS: 6.2500 mg | ORAL_TABLET | Freq: Two times a day (BID) | ORAL | 1 refills | Status: DC

## 2018-08-25 MED ORDER — TORSEMIDE 20 MG PO TABS: ORAL_TABLET | ORAL | 1 refills | Status: DC

## 2018-08-25 MED ORDER — AMIODARONE HCL 200 MG PO TABS: 200.0000 mg | ORAL_TABLET | Freq: Two times a day (BID) | ORAL | 1 refills | Status: DC

## 2018-08-25 MED ORDER — POTASSIUM CHLORIDE ER 20 MEQ PO TBCR: 20.0000 meq | EXTENDED_RELEASE_TABLET | Freq: Every day | ORAL | 1 refills | Status: DC | PRN

## 2018-08-25 MED FILL — CARVEDILOL 6.25 MG TABLET: 6.25 | 30 days supply | Qty: 60 | Fill #0

## 2018-08-25 MED FILL — TORSEMIDE 20 MG TABLET: 20 | 10 days supply | Qty: 30 | Fill #0

## 2018-08-25 MED FILL — POTASSIUM CHLORIDE CRYS ER: 20 | 30 days supply | Qty: 30 | Fill #0

## 2018-08-25 MED FILL — AMIODARONE HCL 200 MG TAB: 200 | 30 days supply | Qty: 60 | Fill #0

## 2018-08-25 MED FILL — ISOSORBIDE MN ER 60 MG TAB: 60 | 30 days supply | Qty: 30 | Fill #0

## 2018-08-25 MED FILL — hydrALAZINE HCL 50 MG TABS: 50 | 30 days supply | Qty: 90 | Fill #0

## 2018-08-25 MED FILL — ATORVASTATIN 40 MG TABLET: 40 | 30 days supply | Qty: 30 | Fill #0

## 2018-08-25 MED FILL — DIGOXIN 250 MCG TAB: 250 | 30 days supply | Qty: 30 | Fill #0

## 2018-08-25 MED FILL — SPIRONOLACTONE 25 MG TABLET: 25 | 30 days supply | Qty: 30 | Fill #0

## 2018-08-26 ENCOUNTER — Inpatient Hospital Stay: Payer: Self-pay | Admitting: Emergency Medicine

## 2018-08-26 ENCOUNTER — Inpatient Hospital Stay: Payer: Managed Care, Other (non HMO) | Admitting: Family Medicine

## 2018-09-03 NOTE — Telephone Encounter (Signed)
Made a 2nd attempt to call for overdue inr busy tone

## 2018-09-04 ENCOUNTER — Telehealth: Payer: Self-pay

## 2018-09-04 NOTE — Telephone Encounter (Signed)
Called pt left msg for overdue inr 

## 2018-09-10 NOTE — Telephone Encounter (Signed)
Called left msg overdue inr

## 2018-09-25 ENCOUNTER — Telehealth: Payer: Self-pay

## 2018-09-25 NOTE — Telephone Encounter (Signed)
Called pt to notify of overdue inr left msg

## 2018-10-15 ENCOUNTER — Ambulatory Visit (HOSPITAL_COMMUNITY)
Admission: RE | Admit: 2018-10-15 | Discharge: 2018-10-15 | Disposition: A | Discharge status: Home / Self Care | Payer: Self-pay | Source: Ambulatory Visit | Attending: Internal Medicine | Admitting: Internal Medicine

## 2018-10-15 ENCOUNTER — Encounter (HOSPITAL_COMMUNITY): Payer: Self-pay | Admitting: Internal Medicine

## 2018-10-15 ENCOUNTER — Other Ambulatory Visit: Payer: Self-pay

## 2018-10-15 VITALS — BP 130/88 | HR 87 | Wt >= 6400 oz

## 2018-10-15 DIAGNOSIS — I130 Hypertensive heart and chronic kidney disease with heart failure and stage 1 through stage 4 chronic kidney disease, or unspecified chronic kidney disease: Secondary | ICD-10-CM | POA: Insufficient documentation

## 2018-10-15 DIAGNOSIS — Z7901 Long term (current) use of anticoagulants: Secondary | ICD-10-CM | POA: Insufficient documentation

## 2018-10-15 DIAGNOSIS — I272 Pulmonary hypertension, unspecified: Secondary | ICD-10-CM | POA: Insufficient documentation

## 2018-10-15 DIAGNOSIS — Z6841 Body Mass Index (BMI) 40.0 and over, adult: Secondary | ICD-10-CM | POA: Insufficient documentation

## 2018-10-15 DIAGNOSIS — N183 Chronic kidney disease, stage 3 (moderate): Secondary | ICD-10-CM | POA: Insufficient documentation

## 2018-10-15 DIAGNOSIS — I5022 Chronic systolic (congestive) heart failure: Secondary | ICD-10-CM

## 2018-10-15 DIAGNOSIS — Z8249 Family history of ischemic heart disease and other diseases of the circulatory system: Secondary | ICD-10-CM | POA: Insufficient documentation

## 2018-10-15 DIAGNOSIS — Z79899 Other long term (current) drug therapy: Secondary | ICD-10-CM | POA: Insufficient documentation

## 2018-10-15 DIAGNOSIS — G4733 Obstructive sleep apnea (adult) (pediatric): Secondary | ICD-10-CM | POA: Insufficient documentation

## 2018-10-15 DIAGNOSIS — I10 Essential (primary) hypertension: Secondary | ICD-10-CM

## 2018-10-15 DIAGNOSIS — I2510 Atherosclerotic heart disease of native coronary artery without angina pectoris: Secondary | ICD-10-CM | POA: Insufficient documentation

## 2018-10-15 DIAGNOSIS — I428 Other cardiomyopathies: Secondary | ICD-10-CM | POA: Insufficient documentation

## 2018-10-15 DIAGNOSIS — I5042 Chronic combined systolic (congestive) and diastolic (congestive) heart failure: Secondary | ICD-10-CM | POA: Insufficient documentation

## 2018-10-15 DIAGNOSIS — I480 Paroxysmal atrial fibrillation: Secondary | ICD-10-CM | POA: Insufficient documentation

## 2018-10-15 DIAGNOSIS — N529 Male erectile dysfunction, unspecified: Secondary | ICD-10-CM | POA: Insufficient documentation

## 2018-10-15 LAB — COMPREHENSIVE METABOLIC PANEL
ALT: 12 U/L (ref 0–44)
AST: 14 U/L — ABNORMAL LOW (ref 15–41)
Albumin: 3.2 g/dL — ABNORMAL LOW (ref 3.5–5.0)
Alkaline Phosphatase: 57 U/L (ref 38–126)
Anion gap: 11 (ref 5–15)
BILIRUBIN TOTAL: 3.4 mg/dL — AB (ref 0.3–1.2)
BUN: 12 mg/dL (ref 6–20)
CALCIUM: 8.4 mg/dL — AB (ref 8.9–10.3)
CO2: 29 mmol/L (ref 22–32)
Chloride: 98 mmol/L (ref 98–111)
Creatinine, Ser: 1.29 mg/dL — ABNORMAL HIGH (ref 0.61–1.24)
GFR calc Af Amer: >60 mL/min (ref >60)
GFR calc non Af Amer: >60 mL/min (ref >60)
Glucose, Bld: 102 mg/dL — ABNORMAL HIGH (ref 70–99)
Potassium: 3.3 mmol/L — ABNORMAL LOW (ref 3.5–5.1)
Sodium: 138 mmol/L (ref 135–145)
Total Protein: 6.5 g/dL (ref 6.5–8.1)

## 2018-10-15 LAB — TSH: TSH: 0.343 u[IU]/mL — ABNORMAL LOW (ref 0.350–4.500)

## 2018-10-15 LAB — T4, FREE: Free T4: 1.58 ng/dL (ref 0.82–1.77)

## 2018-10-15 MED ORDER — SILDENAFIL CITRATE 50 MG PO TABS: 50.0000 mg | ORAL_TABLET | Freq: Every day | ORAL | 0 refills | Status: DC | PRN

## 2018-10-15 MED ORDER — LOSARTAN POTASSIUM 25 MG PO TABS: 25.0000 mg | ORAL_TABLET | Freq: Every day | ORAL | 3 refills | Status: DC

## 2018-10-15 NOTE — Patient Instructions (Signed)
START Losartan 25mg  (1 tab) daily  Labs today We will only contact you if something comes back abnormal or we need to make some changes. Otherwise no news is good news!  Repeat labs in 2 weeks.  Your physician recommends that you schedule a follow-up appointment in: 3-4 months with Dr. Gala Romney.

## 2018-10-15 NOTE — Progress Notes (Signed)
Pt received prescription for Compression stockings

## 2018-10-15 NOTE — Progress Notes (Signed)
Advanced Heart Failure Clinic Note    Primary Care: Dr Clelia Croft. INR followed by PCP.  Primary Cardiologist: Dr. Gala Romney   HPI: Mark Baker is a 38 y.o. male with a past medical history of morbid obesity, chronic combined systolic and diastolic CHF (EF 85-46%) due to NICM, OSA, PAF, and medical noncompliance.   Admitted 10/31/16-11/14/16 with atrial fibrillation RVR and low output (co ox 38%). Started on Amiodarone and milrinone. TEE on 11/07/16 - EF 20% RV severely HK Massive LA (7.7cm), Severely dilated RA, Normal AV, Severe central MR, Moderate TR (RVSP 65-70), Trivial PR.  He spontaneously converted to NSR. He diuresed 44 pounds, discharge weight was 477 pounds. Discharged on torsemide 40mg  daily. Renal function inhibited starting ARB/ARNI.   Admitted 01/01/17-01/02/17 with syncope, event was triggered by an argument at home. Outpatient monitor was suggested. He remained in NSR on telemetry overnight.   Admitted 4/16-4/17/19 with CP and syncope. EKG unchanged and cardiac enzymes were flat. Orthostatics negative. Weight was up 30 lbs and he was diuresed with IV lasix, then transitioned to torsemide 40 mg am, 20 mg pm. Losartan was stopped and he was started on Entresto. DC weight: 499 lbs.   Saw Dr Elberta Fortis for possible ICD. He had recommendations to lose weight due to higher complication risk.   Had recent right quad tendon repair in October 2019. In 07/24/18 with post op infection with purulent drainage from wound. He underwent I & D right knee by Dr. Aundria Rud and post op on IV antibiotics as well as IV heparin. Post op course complicated by acute on chronic respiratory failure, acute on chronic renal failure with decrease in UOP as well as somnolence. Respiratory status improved with BIPAP and medical issues resolving. CIR recommended due to functional decline.   Today he returns for HF follow up. Still on crutches but getting around much better. Denies CP or SOB. No palpitations. Gets fatigued with  crutched. No edema, orthopnea or PND. Has lost about 50 pounds. No bleeding with warfarin.   Holter Monitor 01/2017: NSR with occasional PVCs. No recurrent AF or arrhythmias.   R/LHC 03/2017:  Mid RCA lesion, 20 %stenosed.   Findings:  Ao = 118/88 (97) LV = 132/28 RA = 9 RV = 54/9 PA = 51/20 (34) PCW = 22 Fick cardiac output/index = 9.9/3.2 PVR = 1.2 WU SVR = 708 dynes Ao sat = 88% PA sat = 68%,   Assessment: 1. Minimal non-obstructive CAD 2. Mild pulmonary HTN 3. NICM with EF ~25-30% though hard to assess EF on LV gram due to poor opacification  Echo 11/2017: EF 20-25%, moderate LVH, diffuse HK, akinesis of the inferolateral, inferior, and inferoseptal myocardium, mild MR, severe dilation of RA and LA  Review of systems complete and found to be negative unless listed in HPI.   Past Medical History:  Diagnosis Date  . Childhood asthma    no exacerbation since age 53   . Chronic combined systolic and diastolic CHF (congestive heart failure) (HCC)    a) ECHO (11/2012): EF 40-45%, RV fx difficult to see, nl size b) ECHO (05/2014): EF 20-25%, grade 2 DD, RV mildly dilated and sys fx mod reduced  . Hypertension   . Migraine    "down to maybe 2/year now; used to have them q other week" (11/26/2017)  . NICM (nonischemic cardiomyopathy) (HCC)    a) (11/2012): normal coronaries  . OSA on CPAP    wears CPAP  . Postoperative wound infection    right knee  .  Wears glasses     Current Outpatient Medications  Medication Sig Dispense Refill  . acetaminophen (TYLENOL) 325 MG tablet Take 1-2 tablets (325-650 mg total) by mouth every 4 (four) hours as needed for mild pain.    Marland Kitchen amiodarone (PACERONE) 200 MG tablet Take 1 tablet (200 mg total) by mouth 2 (two) times daily. 60 tablet 1  . atorvastatin (LIPITOR) 40 MG tablet Take 1 tablet (40 mg total) by mouth daily. 30 tablet 1  . carvedilol (COREG) 6.25 MG tablet Take 1 tablet (6.25 mg total) by mouth 2 (two) times daily. 60 tablet 1    . digoxin (LANOXIN) 0.25 MG tablet Take 1 tablet (0.25 mg total) by mouth daily. 30 tablet 1  . hydrALAZINE (APRESOLINE) 50 MG tablet Take 1 tablet (50 mg total) by mouth every 8 (eight) hours. 90 tablet 1  . isosorbide mononitrate (IMDUR) 60 MG 24 hr tablet Take 1 tablet (60 mg total) by mouth daily. 30 tablet 1  . Potassium Chloride ER 20 MEQ TBCR Take 20 mEq by mouth daily as needed (cramping). 60 tablet 1  . spironolactone (ALDACTONE) 25 MG tablet Take 1 tablet (25 mg total) by mouth daily. 30 tablet 1  . torsemide (DEMADEX) 20 MG tablet Take 60 mg (3 tablets) daily in am. (Patient taking differently: Take 60 mg (3 tablets) daily in am. Pt takes 1 in the morning and 2 in the evening) 30 tablet 1  . warfarin (COUMADIN) 4 MG tablet Take 1 tablet (4 mg total) by mouth daily at 6 PM. 30 tablet 0   No current facility-administered medications for this encounter.     Allergies  Allergen Reactions  . Aspirin Hives, Itching and Rash  . Entresto [Sacubitril-Valsartan] Cough    Was on Entresto 07/2016-09/2016      Social History   Socioeconomic History  . Marital status: Married    Spouse name: Not on file  . Number of children: Not on file  . Years of education: Not on file  . Highest education level: Not on file  Occupational History  . Not on file  Social Needs  . Financial resource strain: Not on file  . Food insecurity:    Worry: Not on file    Inability: Not on file  . Transportation needs:    Medical: Not on file    Non-medical: Not on file  Tobacco Use  . Smoking status: Never Smoker  . Smokeless tobacco: Never Used  Substance and Sexual Activity  . Alcohol use: Not Currently    Alcohol/week: 0.0 standard drinks    Comment: 11/26/2017 "nothing since 07/22/18  . Drug use: Never  . 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   Lifestyle  . Physical activity:    Days per week: Not on file    Minutes per session: Not on file  . Stress: Not  on file  Relationships  . Social connections:    Talks on phone: Not on file    Gets together: Not on file    Attends religious service: Not on file    Active member of club or organization: Not on file    Attends meetings of clubs or organizations: Not on file    Relationship status: Not on file  . Intimate partner violence:    Fear of current or ex partner: Not on file    Emotionally abused: Not on file    Physically abused: Not on file  Forced sexual activity: Not on file  Other Topics Concern  . Not on file  Social History Narrative   Lives in [place] with wife of 4 years.  Works for [employer] at [employer].  1 son and 2 daughters.     Denies cigarettes. Drank 1 beer last night 11/22/12. Denies drugs             Family History  Problem Relation Age of Onset  . Hypertension Father   . Heart failure Father        paternal side of family   . Hypertension Sister   . Stroke Maternal Grandmother   . Hypertension Other        maternal side of family   . Hypertension Mother   . Diabetes Neg Hx   . Hyperlipidemia Neg Hx     Vitals:   10/15/18 1217  BP: 130/88  Pulse: 87  SpO2: 94%  Weight: (!) 210.7 kg (464 lb 6.4 oz)   Wt Readings from Last 3 Encounters:  10/15/18 (!) 210.7 kg (464 lb 6.4 oz)  07/30/18 (!) 235 kg (518 lb 1.3 oz)  07/23/18 (!) 238.6 kg (526 lb 0.3 oz)    PHYSICAL EXAM: General:  Obese male ambulating with crutches No resp difficulty HEENT: normal Neck: supple. no JVD. Carotids 2+ bilat; no bruits. No lymphadenopathy or thryomegaly appreciated. Cor: PMI nondisplaced. Regular rate & rhythm. No rubs, gallops or murmurs. Lungs: clear Abdomen: obese soft, nontender, nondistended. No hepatosplenomegaly. No bruits or masses. Good bowel sounds. Extremities: no cyanosis, clubbing, rash, edema Neuro: alert & orientedx3, cranial nerves grossly intact. moves all 4 extremities w/o difficulty. Affect pleasant   ASSESSMENT & PLAN: 1. Chronic combined systolic and  diastolic CHF: Echo 03/2017 EF 30-35%, RV not well visualized.  10/2016 EF 20-25% NICM, likely tachymediated although noncompliance may play a role. Echo 11/2017: EF 20-25%, moderate LVH, diffuse HK, mild MR, severe dilation of RA and LA - Echo 12/19 EF 25-30% - NYHA II. Volume status stable. - Continue torsemide 20/40 - Digoxin 0.25 mcg daily - Hydralazine 50 mg TID, Continue imdur 60 mg daily - Spiro 12.5 mg daily - Coreg 3.125mg  BID - Restart losartan 25 mg daily -Refuses Entresto due to severe cough -Evaluate by EP for ICD however he needed lose weight for consideration.  BMET today in and in 7 days.    2. Obesity: - Body mass index is 58.05 kg/m.  - Reinforced portion control and exercise.  - Congratulated him on weight loss and urged him to continue  3. OSA - Continue CPAP nightly.   4. CKD stage III - Will check BMET today - Baseline 1.3-1.5.   5. PAF - ECG today NSR 78 bpm Personally reviewed - Continue amio 200 bid. Can consider decreasing to 200 daily at next visit. - This patients CHA2DS2-VASc Score and unadjusted Ischemic Stroke Rate (% per year) is equal to 2.2 % stroke rate/year from a score of 2 Above score calculated as 1 point each if present [CHF, HTN, DM, Vascular=MI/PAD/Aortic Plaque, Age if 65-74, or Male], 2 points each if present [Age > 75, or Stroke/TIA/TE] - Continue coumadin for anticoagulation.  - PCP managing INR. No bleeding,.  - Check amio labs    6. Erectile dysfunction - Continue sildenafil. He understands he should not take imdur if he plan to take sildenafil.    7. HTN - Blood pressure well controlled. Continue current regimen.   [name], MD 10/15/18

## 2018-10-16 LAB — T3, FREE: T3, Free: 2.5 pg/mL (ref 2.0–4.4)

## 2018-10-19 ENCOUNTER — Other Ambulatory Visit (HOSPITAL_COMMUNITY): Payer: Self-pay

## 2018-10-26 ENCOUNTER — Other Ambulatory Visit (HOSPITAL_COMMUNITY): Payer: Self-pay

## 2018-11-06 ENCOUNTER — Encounter (HOSPITAL_COMMUNITY): Payer: Self-pay | Admitting: *Deleted

## 2018-11-06 NOTE — Progress Notes (Signed)
Received forms from [employer], pt has applied for a job accommodations and we need to complete forms for him.    Form completed and faxed to them at [phone] atten: Marisue [place]

## 2018-11-12 IMAGING — DX DG CHEST 1V PORT
1 series · 1 of 1 positions shown · non-contrast
Comparison: October 31, 2016

CLINICAL DATA: Evaluate left-sided PICC line.

EXAM:
PORTABLE CHEST 1 VIEW

[chest ap]
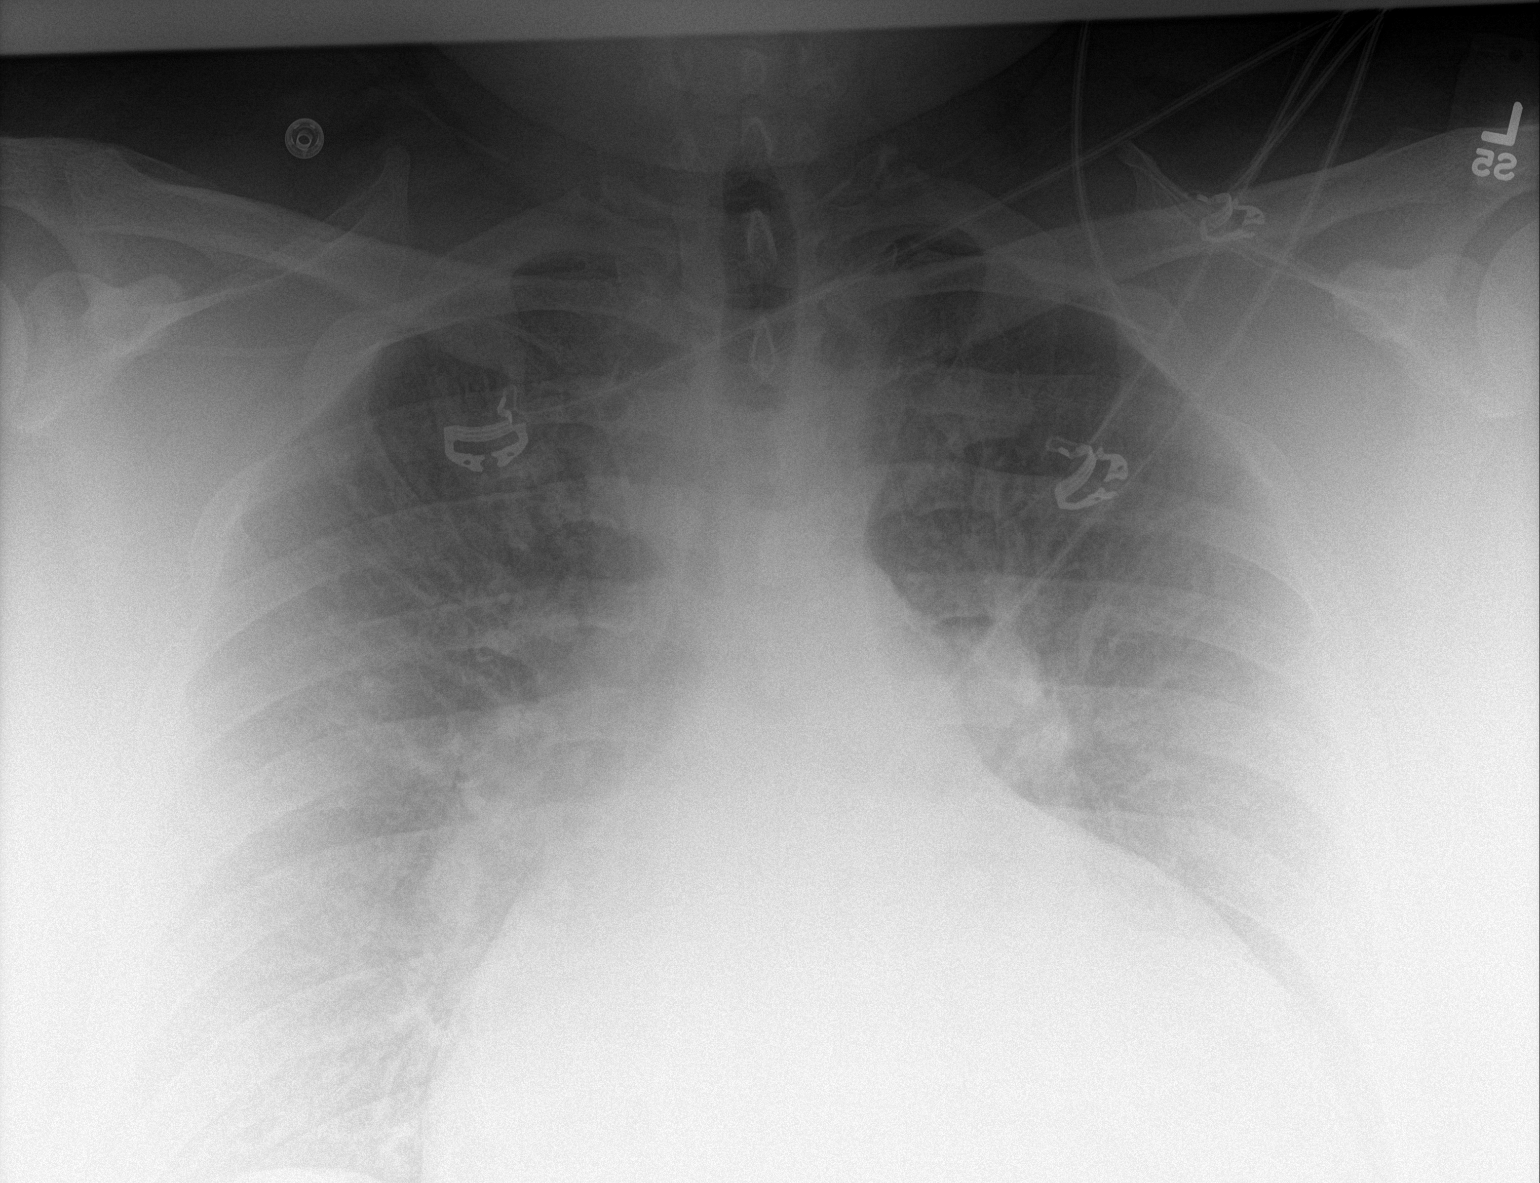

[1 of 1 positions shown; findings below may reference images not displayed]

FINDINGS: Marked cardiomegaly. No pneumothorax. There is a right-sided PICC
line. It is difficult to see the central tip but it extends at least
into the central SVC. Whether it extends into the right atrium is
unclear. Pulmonary venous congestion without overt edema remains.
IMPRESSION: 1. New right PICC line. It is difficult to see the distal tip but it
extends at least into the central SVC. The location of the tip
relative to the right atrium is unclear.
2. Cardiomegaly and pulmonary venous congestion.

## 2018-11-16 ENCOUNTER — Ambulatory Visit: Payer: Self-pay | Admitting: Internal Medicine

## 2018-11-25 ENCOUNTER — Telehealth (HOSPITAL_COMMUNITY): Payer: Self-pay

## 2018-11-25 NOTE — Telephone Encounter (Signed)
PA initiated for Sildenafil 50mg  via CMM

## 2019-01-11 IMAGING — DX DG CHEST 1V PORT
1 series · 2 of 2 positions shown · non-contrast
Comparison: 11/02/2016.

CLINICAL DATA: Syncopal episode.  Chest pain .

EXAM:
PORTABLE CHEST 1 VIEW

[Series 1: chest ap · 0.14mm/px · 2 of 2 slices shown]
[im 1/2]
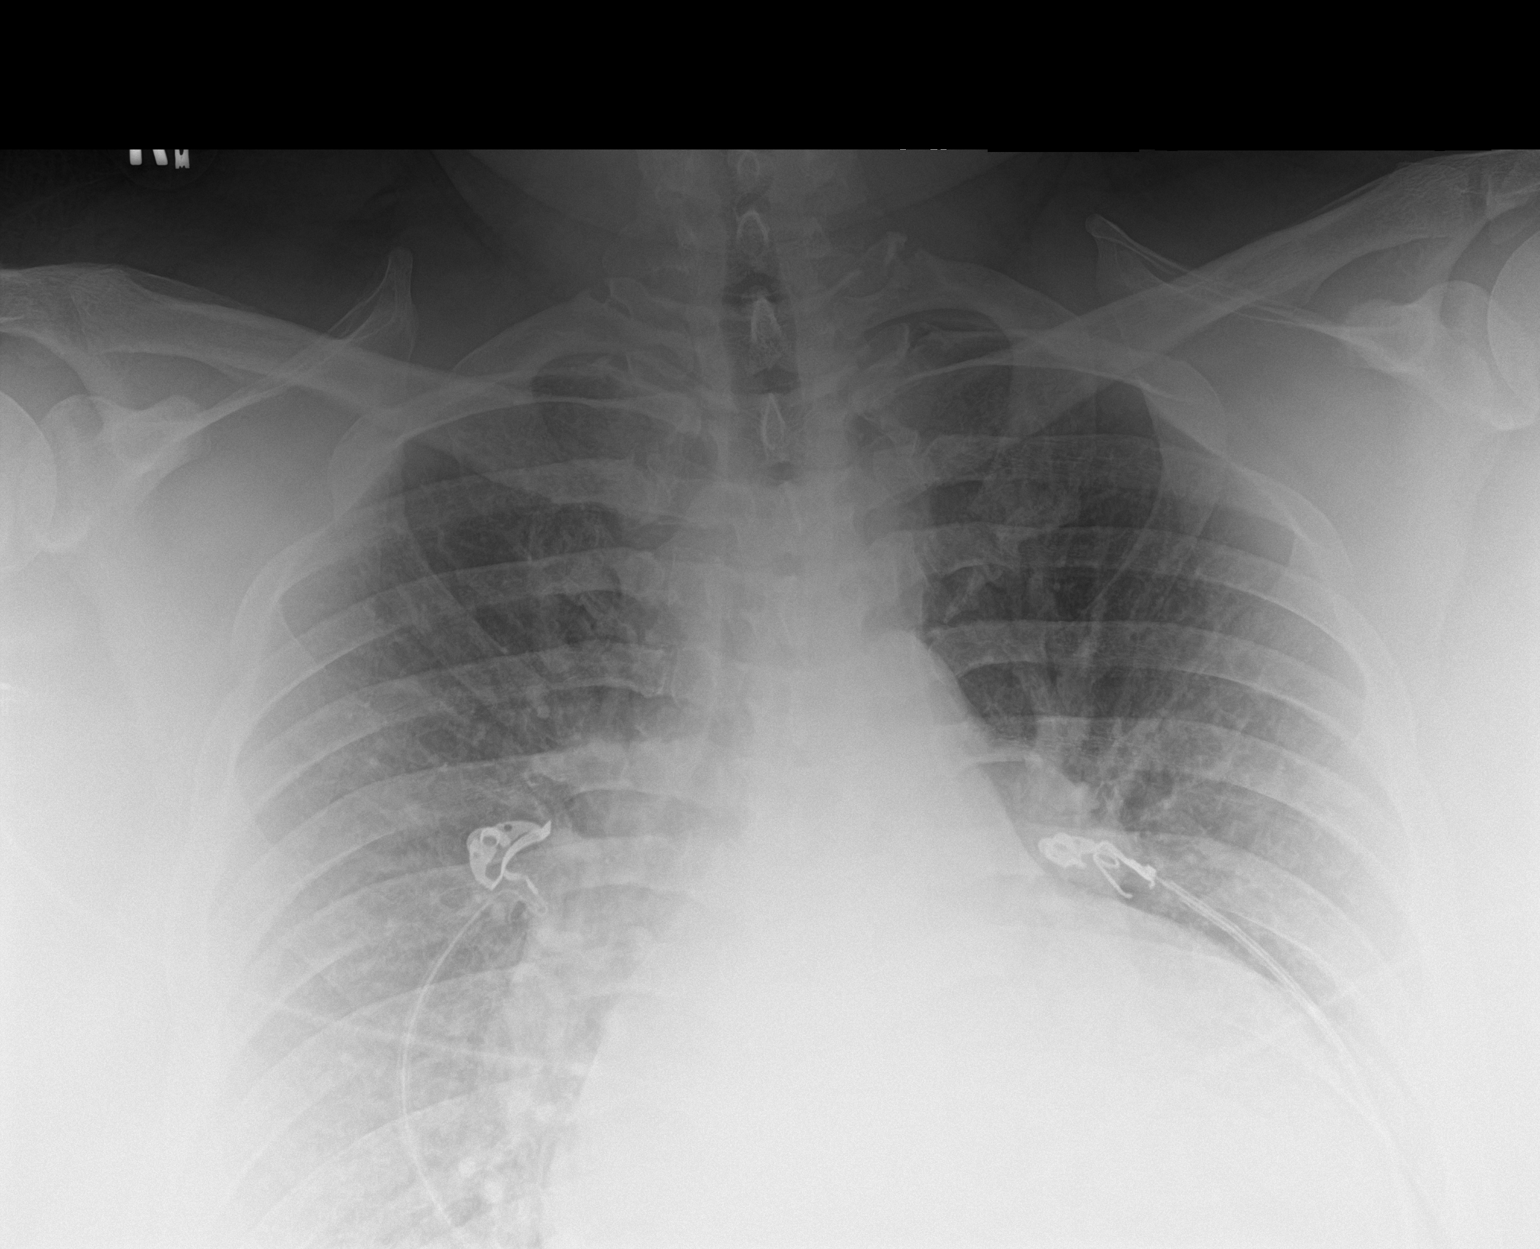
[im 2/2]
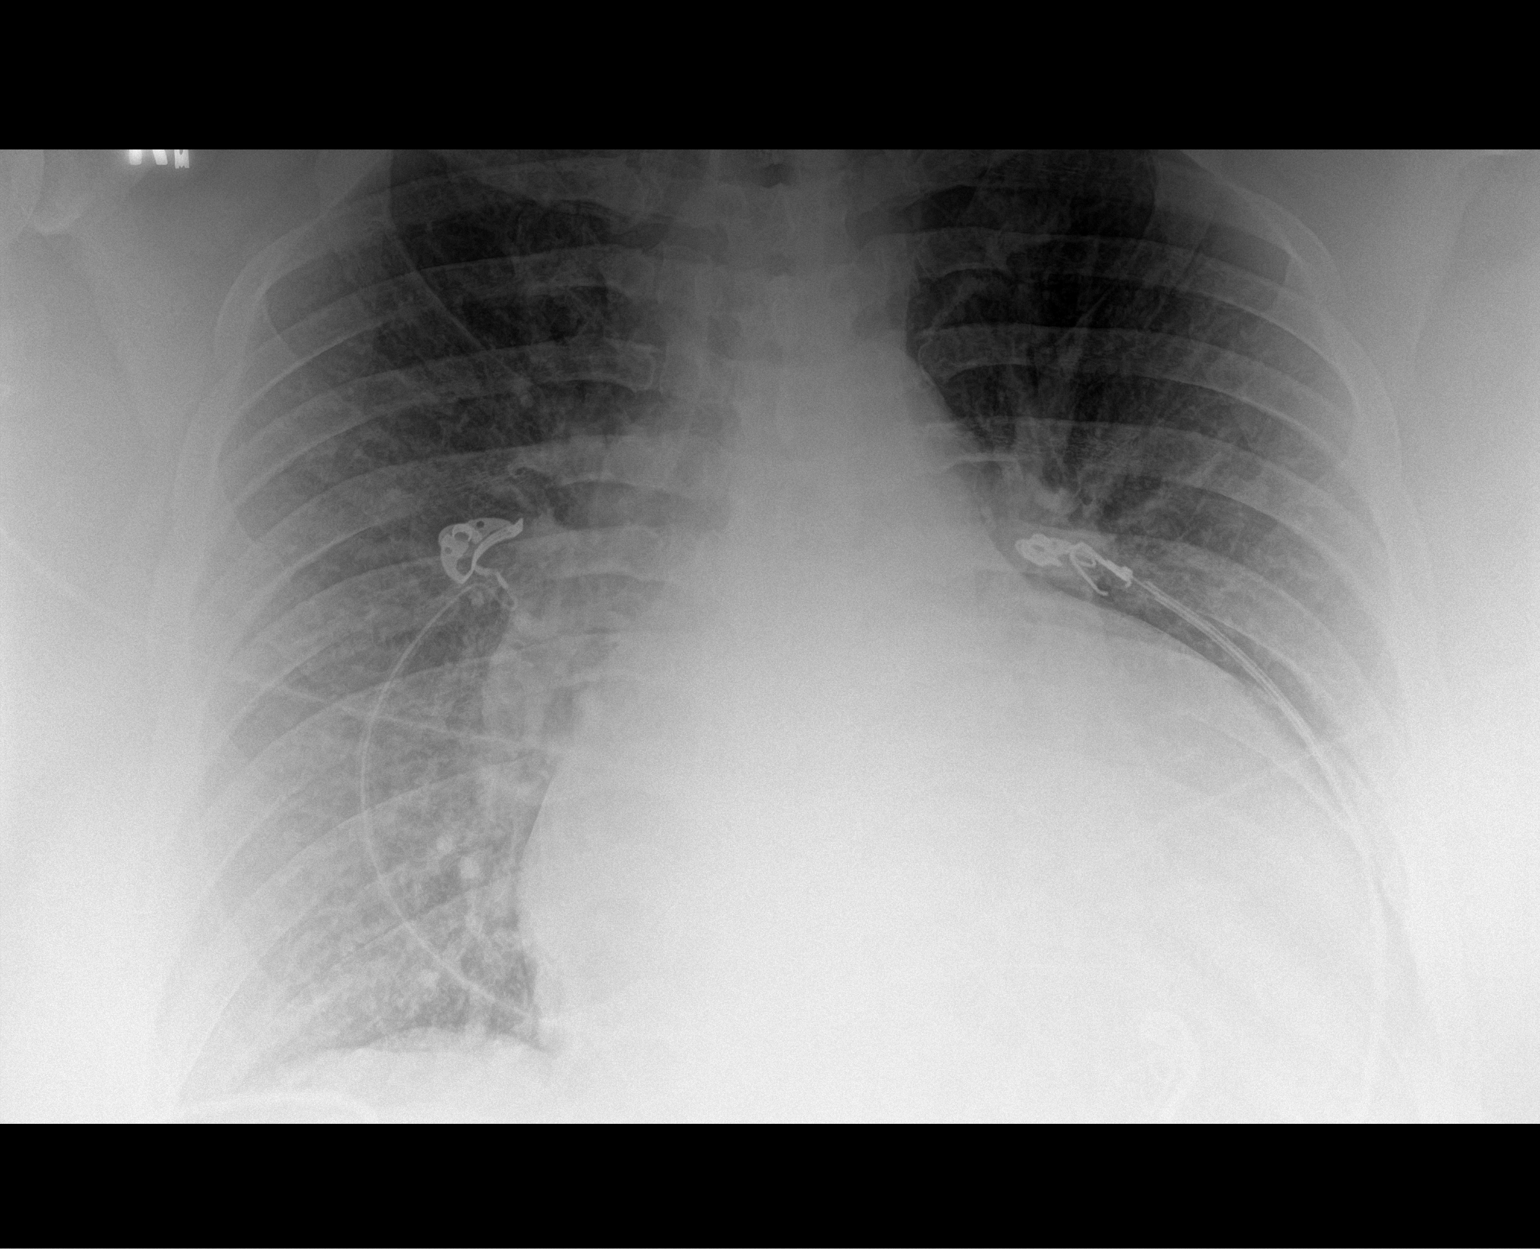

[2 of 2 positions shown; findings below may reference images not displayed]

FINDINGS: Interval removal of previously identified PICC line. Cardiomegaly
with increased pulmonary venous congestion and interstitial
prominence suggesting CHF. Bilateral pleural effusions are most
likely present. Lung bases are incompletely imaged. No pneumothorax
identified .
IMPRESSION: Congestive heart failure with bilateral interstitial edema .

## 2019-01-18 ENCOUNTER — Telehealth (HOSPITAL_COMMUNITY): Payer: Self-pay | Admitting: Vascular Surgery

## 2019-01-18 NOTE — Telephone Encounter (Signed)
Called pt to change 6/15 in office appt w/db to a televisit, pt VM is full will try back

## 2019-01-25 ENCOUNTER — Other Ambulatory Visit: Payer: Self-pay

## 2019-01-25 ENCOUNTER — Ambulatory Visit (HOSPITAL_COMMUNITY)
Admission: RE | Admit: 2019-01-25 | Discharge: 2019-01-25 | Disposition: A | Discharge status: Home / Self Care | Payer: Self-pay | Source: Ambulatory Visit | Attending: Internal Medicine | Admitting: Internal Medicine

## 2019-01-25 NOTE — Progress Notes (Signed)
Heart Failure TeleHealth Note  Due to national recommendations of social distancing due to [place] 19, Audio/video telehealth visit is felt to be most appropriate for this patient at this time.  See MyChart message from today for patient consent regarding telehealth for [facility].  Date:  01/25/2019   ID:  Mark Baker, DOB 1981/05/09, MRN [id number]  Location: Home  Provider location: [facility] Type of Visit: Established patient  PCP:  Shawnee Knapp, MD  Cardiologist:  Glori Bickers, MD Primary HF: Bensimhon  Chief Complaint: Heart Failure follow-up   History of Present Illness:  HPI: Mark Baker is a 38 y.o. male with a past medical history of morbid obesity, chronic combined systolic and diastolic CHF (EF 82-42%) due to NICM, OSA, PAF, and medical noncompliance.   Admitted 10/31/16-11/14/16 with atrial fibrillation RVR and low output (co ox 38%). Started on Amiodarone and milrinone. TEE on 11/07/16 - EF 20% RV severely HK Massive LA (7.7cm), Severely dilated RA, Normal AV, Severe central MR, Moderate TR (RVSP 65-70), Trivial PR.  He spontaneously converted to NSR. He diuresed 44 pounds, discharge weight was 477 pounds. Discharged on torsemide 40mg  daily. Renal function inhibited starting ARB/ARNI.   Admitted 01/01/17-01/02/17 with syncope, event was triggered by an argument at home. Outpatient monitor showed NSR with occasional PVCs. No recurrent AF or other arrhythmias noted.  d. Admitted 4/16-4/17/19 with CP and syncope. EKG unchanged and cardiac enzymes were flat. Orthostatics negative. Weight was up 30 lbs and he was diuresed with IV lasix, then transitioned to torsemide 40 mg am, 20 mg pm. Losartan was stopped and he was started on Entresto. DC weight: 499 lbs.   Saw Dr Curt Bears for possible ICD. He had recommendations to lose weight due to higher complication risk.   Had right quad tendon repair in October 2019. In 07/24/18 with post op  infection with purulent drainage from wound. He underwent I & D right knee by Dr. Stann Mainland and post op on IV antibiotics as well as IV heparin. Post op course complicated by acute on chronic respiratory failure, acute on chronic renal failure.   He presents via [employer], civil (consulting) for a telehealth visit today.  At last visit losartan restarted (refused Entresto due to cough)   Holter Monitor 01/2017: NSR with occasional PVCs. No recurrent AF or arrhythmias.   R/LHC 03/2017:  Mid RCA lesion, 20 %stenosed.  Findings:  Ao = 118/88 (97) LV = 132/28 RA = 9 RV = 54/9 PA = 51/20 (34) PCW = 22 Fick cardiac output/index = 9.9/3.2 PVR = 1.2 WU SVR = 708 dynes Ao sat = 88% PA sat = 68%,   Assessment: 1. Minimal non-obstructive CAD 2. Mild pulmonary HTN 3. NICM with EF ~25-30% though hard to assess EF on LV gram due to poor opacification  Echo 11/2017: EF 20-25%, moderate LVH, diffuse HK, akinesis of the inferolateral, inferior, and inferoseptalmyocardium, mild MR, severe dilation of RA and LA  Review of systems complete and found to be negative unless listed in HPI.     Mark Baker denies symptoms worrisome for COVID 19.   Past Medical History:  Diagnosis Date  . Childhood asthma    no exacerbation since age 89   . Chronic combined systolic and diastolic CHF (congestive heart failure) ([place])    a) ECHO (11/2012): EF 40-45%, RV fx difficult to see, nl size b) ECHO (05/2014): EF 20-25%, grade 2 DD, RV mildly dilated and sys fx mod reduced  .  Hypertension   . Migraine    "down to maybe 2/year now; used to have them q other week" (11/26/2017)  . NICM (nonischemic cardiomyopathy) (HCC)    a) (11/2012): normal coronaries  . OSA on CPAP    wears CPAP  . Postoperative wound infection    right knee  . Wears glasses    Past Surgical History:  Procedure Laterality Date  . CARDIOVERSION N/A 11/07/2016   Procedure: CARDIOVERSION;  Surgeon: Dolores Pattyaniel R Bensimhon, MD;  [facility];  Service: Cardiovascular;  Laterality: N/A;  Will need extra help positioning patient  . INCISION AND DRAINAGE OF WOUND Right 07/23/2018   Procedure: Right knee  incisional irrigation and debridement;  Surgeon: Yolonda Kidaogers, Jason Patrick, MD;  [facility];  Service: Orthopedics;  Laterality: Right;  . KNEE ARTHROSCOPY Right 2003   ANTERIOR CRUCIATE LIGAMENT REPAIR   . LEFT HEART CATHETERIZATION WITH CORONARY ANGIOGRAM N/A 11/24/2012   Procedure: LEFT HEART CATHETERIZATION WITH CORONARY ANGIOGRAM;  Surgeon: Peter M SwazilandJordan, MD;  [facility];  Service: Cardiovascular;  Laterality: N/A;  . QUADRICEPS TENDON REPAIR Right 06/10/2018   Procedure: REPAIR QUADRICEP TENDON;  Surgeon: Yolonda Kidaogers, Jason Patrick, MD;  [facility];  Service: Orthopedics;  Laterality: Right;  . RIGHT/LEFT HEART CATH AND CORONARY ANGIOGRAPHY N/A 03/18/2017   Procedure: Right/Left Heart Cath and Coronary Angiography;  Surgeon: Dolores PattyBensimhon, Daniel R, MD;  [facility];  Service: Cardiovascular;  Laterality: N/A;  . TEE WITHOUT CARDIOVERSION N/A 11/07/2016   Procedure: TRANSESOPHAGEAL ECHOCARDIOGRAM (TEE);  Surgeon: Dolores Pattyaniel R Bensimhon, MD;  [facility];  Service: Cardiovascular;  Laterality: N/A;     Current Outpatient Medications  Medication Sig Dispense Refill  . acetaminophen (TYLENOL) 325 MG tablet Take 1-2 tablets (325-650 mg total) by mouth every 4 (four) hours as needed for mild pain.    Marland Kitchen. amiodarone (PACERONE) 200 MG tablet Take 1 tablet (200 mg total) by mouth 2 (two) times daily. 60 tablet 1  . atorvastatin (LIPITOR) 40 MG tablet Take 1 tablet (40 mg total) by mouth daily. 30 tablet 1  . carvedilol (COREG) 6.25 MG tablet Take 1 tablet (6.25 mg total) by mouth 2 (two) times daily. 60 tablet 1  . digoxin (LANOXIN) 0.25 MG tablet Take 1 tablet (0.25 mg total) by mouth daily. 30 tablet 1  . hydrALAZINE (APRESOLINE) 50 MG tablet Take 1 tablet (50 mg total) by mouth every 8 (eight) hours. 90 tablet  1  . isosorbide mononitrate (IMDUR) 60 MG 24 hr tablet Take 1 tablet (60 mg total) by mouth daily. 30 tablet 1  . losartan (COZAAR) 25 MG tablet Take 1 tablet (25 mg total) by mouth daily. 90 tablet 3  . Potassium Chloride ER 20 MEQ TBCR Take 20 mEq by mouth daily as needed (cramping). 60 tablet 1  . sildenafil (VIAGRA) 50 MG tablet Take 1 tablet (50 mg total) by mouth daily as needed for erectile dysfunction. 10 tablet 0  . spironolactone (ALDACTONE) 25 MG tablet Take 1 tablet (25 mg total) by mouth daily. 30 tablet 1  . torsemide (DEMADEX) 20 MG tablet Take 60 mg (3 tablets) daily in am. (Patient taking differently: Take 60 mg (3 tablets) daily in am. Pt takes 1 in the morning and 2 in the evening) 30 tablet 1  . warfarin (COUMADIN) 4 MG tablet Take 1 tablet (4 mg total) by mouth daily at 6 PM. 30 tablet 0   No current facility-administered medications for this encounter.  Allergies:   Aspirin and Entresto [sacubitril-valsartan]   Social History:  The patient  reports that he has never smoked. He has never used smokeless tobacco. He reports previous alcohol use. He reports that he does not use drugs.   Family History:  The patient's family history includes Heart failure in his father; Hypertension in his father, mother, sister, and another family member; Stroke in his maternal grandmother.   ROS:  Please see the history of present illness.   All other systems are personally reviewed and negative.   Exam:  (Video/Tele Health Call; Exam is subjective and or/visual.) General:  Speaks in full sentences. No resp difficulty. Lungs: Normal respiratory effort with conversation.  Abdomen: Non-distended per patient report Extremities: Pt denies edema. Neuro: Alert & oriented x 3.   Recent Labs: 07/27/2018: B Natriuretic Peptide 853.6 07/30/2018: Magnesium 2.3 07/31/2018: Hemoglobin 10.8; Platelets 306 10/15/2018: ALT 12; BUN 12; Creatinine, Ser 1.29; Potassium 3.3; Sodium 138; TSH 0.343   Personally reviewed   Wt Readings from Last 3 Encounters:  10/15/18 (!) 210.7 kg (464 lb 6.4 oz)  07/30/18 (!) 235 kg (518 lb 1.3 oz)  07/23/18 (!) 238.6 kg (526 lb 0.3 oz)      ASSESSMENT AND PLAN:  1. Chronic combined systolic and diastolic CHF: Echo 03/2017 EF 30-35%, RV not well visualized.  10/2016 EF 20-25% NICM, likely tachymediated although noncompliance may play a role. Echo 11/2017: EF 20-25%, moderate LVH, diffuse HK, mild MR, severe dilation of RA and LA - Echo 12/19 EF 25-30% - NYHA II. Volume status stable. - Continue torsemide 20/40 - Digoxin 0.25 mcg daily - Hydralazine 50 mg TID, Continue imdur 60 mg daily - Spiro 12.5 mg daily - Coreg 3.125mg  BID - Restart losartan 25 mg daily -Refuses Entresto due to severe cough -Evaluate by EP for ICD however he needed lose weight for consideration.  BMET today in and in 7 days.    2. Obesity: - Body mass index is 58.05 kg/m.  - Reinforced portion control and exercise.  - Congratulated him on weight loss and urged him to continue  3. OSA - Continue CPAP nightly.   4. CKD stage III - Will check BMET today - Baseline 1.3-1.5.   5. PAF - ECG today NSR 78 bpm Personally reviewed - Continue amio 200 bid. Can consider decreasing to 200 daily at next visit. - This patients CHA2DS2-VASc Score and unadjusted Ischemic Stroke Rate (% per year) is equal to 2.2 % stroke rate/year from a score of 2 Above score calculated as 1 point each if present [CHF, HTN, DM, Vascular=MI/PAD/Aortic Plaque, Age if 65-74, or Male], 2 points each if present [Age > 75, or Stroke/TIA/TE] - Continue coumadin for anticoagulation.  - PCP managing INR. No bleeding,.  - Check amio labs    6. Erectile dysfunction - Continue sildenafil. He understands he should not take imdur if he plan to take sildenafil.    7. HTN - Blood pressure well controlled. Continue current regimen.  Signed, [name], MD  01/25/2019 3:52 PM  Advanced Heart  Failure Clinic [facility] [address] [facility] [place] [address] [phone] (office) [phone] (fax)

## 2019-04-24 ENCOUNTER — Other Ambulatory Visit (HOSPITAL_COMMUNITY): Payer: Self-pay | Admitting: Adult Health

## 2019-04-27 ENCOUNTER — Other Ambulatory Visit (HOSPITAL_COMMUNITY): Payer: Self-pay

## 2019-04-27 MED ORDER — SPIRONOLACTONE 25 MG PO TABS: 12.5000 mg | ORAL_TABLET | Freq: Every day | ORAL | 3 refills | Status: DC

## 2019-06-07 ENCOUNTER — Other Ambulatory Visit: Payer: Self-pay | Admitting: Physical Medicine and Rehabilitation

## 2019-06-08 ENCOUNTER — Other Ambulatory Visit (HOSPITAL_COMMUNITY): Payer: Self-pay | Admitting: Adult Health

## 2019-06-08 NOTE — Telephone Encounter (Signed)
Sent request back and called pharmacy to let them know that refill request needs to go to Cardiologist Dr. Glori Bickers

## 2019-06-09 ENCOUNTER — Other Ambulatory Visit (HOSPITAL_COMMUNITY): Payer: Self-pay

## 2019-06-09 MED ORDER — ISOSORBIDE MONONITRATE ER 60 MG PO TB24: 60.0000 mg | ORAL_TABLET | Freq: Every day | ORAL | 1 refills | Status: DC

## 2019-06-09 MED ORDER — DIGOXIN 250 MCG PO TABS: 0.2500 mg | ORAL_TABLET | Freq: Every day | ORAL | 1 refills | Status: DC

## 2019-06-10 ENCOUNTER — Other Ambulatory Visit (HOSPITAL_COMMUNITY): Payer: Self-pay

## 2019-06-10 NOTE — Telephone Encounter (Signed)
Pt is overdue to get his INR checked.  Called pt and LMOM.   Per. Dr. Clayborne Dana note (10-15-2018) pt's coumadin is being followed by PCP. Called pt's PCP on file, Delman Cheadle, office staff stated they have not seen the pt in over a year and a half.

## 2019-06-23 ENCOUNTER — Emergency Department (HOSPITAL_COMMUNITY)
Admission: EM | Admit: 2019-06-23 | Discharge: 2019-06-23 | Disposition: A | Discharge status: Home / Self Care | Payer: Self-pay | Attending: Emergency Medicine | Admitting: Emergency Medicine

## 2019-06-23 ENCOUNTER — Other Ambulatory Visit: Payer: Self-pay

## 2019-06-23 ENCOUNTER — Encounter (HOSPITAL_COMMUNITY): Payer: Self-pay | Admitting: Emergency Medicine

## 2019-06-23 DIAGNOSIS — Y92012 Bathroom of single-family (private) house as the place of occurrence of the external cause: Secondary | ICD-10-CM | POA: Insufficient documentation

## 2019-06-23 DIAGNOSIS — S81811A Laceration without foreign body, right lower leg, initial encounter: Secondary | ICD-10-CM | POA: Insufficient documentation

## 2019-06-23 DIAGNOSIS — R2243 Localized swelling, mass and lump, lower limb, bilateral: Secondary | ICD-10-CM | POA: Insufficient documentation

## 2019-06-23 DIAGNOSIS — Z79899 Other long term (current) drug therapy: Secondary | ICD-10-CM | POA: Insufficient documentation

## 2019-06-23 DIAGNOSIS — T148XXA Other injury of unspecified body region, initial encounter: Secondary | ICD-10-CM

## 2019-06-23 DIAGNOSIS — Y998 Other external cause status: Secondary | ICD-10-CM | POA: Insufficient documentation

## 2019-06-23 DIAGNOSIS — I130 Hypertensive heart and chronic kidney disease with heart failure and stage 1 through stage 4 chronic kidney disease, or unspecified chronic kidney disease: Secondary | ICD-10-CM | POA: Insufficient documentation

## 2019-06-23 DIAGNOSIS — N183 Chronic kidney disease, stage 3 unspecified: Secondary | ICD-10-CM | POA: Insufficient documentation

## 2019-06-23 DIAGNOSIS — Z7901 Long term (current) use of anticoagulants: Secondary | ICD-10-CM | POA: Insufficient documentation

## 2019-06-23 DIAGNOSIS — Y93E1 Activity, personal bathing and showering: Secondary | ICD-10-CM | POA: Insufficient documentation

## 2019-06-23 DIAGNOSIS — W268XXA Contact with other sharp object(s), not elsewhere classified, initial encounter: Secondary | ICD-10-CM | POA: Insufficient documentation

## 2019-06-23 DIAGNOSIS — I5042 Chronic combined systolic (congestive) and diastolic (congestive) heart failure: Secondary | ICD-10-CM | POA: Insufficient documentation

## 2019-06-23 LAB — CBC WITH DIFFERENTIAL/PLATELET
Abs Immature Granulocytes: 0.04 10*3/uL (ref 0.00–0.07)
Basophils Absolute: 0.1 10*3/uL (ref 0.0–0.1)
Basophils Relative: 1 %
Eosinophils Absolute: 0.1 10*3/uL (ref 0.0–0.5)
Eosinophils Relative: 2 %
HCT: 40.5 % (ref 39.0–52.0)
Hemoglobin: 12.1 g/dL — ABNORMAL LOW (ref 13.0–17.0)
Immature Granulocytes: 1 %
Lymphocytes Relative: 15 %
Lymphs Abs: 1.1 10*3/uL (ref 0.7–4.0)
MCH: 27.1 pg (ref 26.0–34.0)
MCHC: 29.9 g/dL — ABNORMAL LOW (ref 30.0–36.0)
MCV: 90.8 fL (ref 80.0–100.0)
Monocytes Absolute: 0.9 10*3/uL (ref 0.1–1.0)
Monocytes Relative: 12 %
Neutro Abs: 4.8 10*3/uL (ref 1.7–7.7)
Neutrophils Relative %: 69 %
Platelets: 226 10*3/uL (ref 150–400)
RBC: 4.46 MIL/uL (ref 4.22–5.81)
RDW: 15.2 % (ref 11.5–15.5)
WBC: 7 10*3/uL (ref 4.0–10.5)
nRBC: 0 % (ref 0.0–0.2)

## 2019-06-23 LAB — BASIC METABOLIC PANEL
Anion gap: 12 (ref 5–15)
BUN: 22 mg/dL — ABNORMAL HIGH (ref 6–20)
CO2: 25 mmol/L (ref 22–32)
Calcium: 9 mg/dL (ref 8.9–10.3)
Chloride: 101 mmol/L (ref 98–111)
Creatinine, Ser: 1.72 mg/dL — ABNORMAL HIGH (ref 0.61–1.24)
GFR calc Af Amer: 57 mL/min — ABNORMAL LOW (ref >60)
GFR calc non Af Amer: 49 mL/min — ABNORMAL LOW (ref >60)
Glucose, Bld: 130 mg/dL — ABNORMAL HIGH (ref 70–99)
Potassium: 3.8 mmol/L (ref 3.5–5.1)
Sodium: 138 mmol/L (ref 135–145)

## 2019-06-23 LAB — PROTIME-INR
INR: 1.6 — ABNORMAL HIGH (ref 0.8–1.2)
Prothrombin Time: 19.1 seconds — ABNORMAL HIGH (ref 11.4–15.2)

## 2019-06-23 MED ORDER — LIDOCAINE-EPINEPHRINE (PF) 2 %-1:200000 IJ SOLN
10.0000 mL | Freq: Once | INTRAMUSCULAR | Status: AC
  Administered 2019-06-23: 10 mL
  Filled 2019-06-23: qty 20

## 2019-06-23 NOTE — Discharge Instructions (Addendum)
Your sutures will dissolve within about 7 days.  If they do not fully dissolve in 7 days you can take a warm washcloth and rub gently open the area.  Keep the area clean and dry.  Follow-up with primary care provider for reevaluation.  Return to the emergency department if any concerning signs or symptoms develop such as fevers, severe swelling or redness streaking up the leg, or persistent bleeding.

## 2019-06-23 NOTE — ED Triage Notes (Signed)
Patient arrived with EMS from home , he was taking a shower this evening and scrubbing his legs with a brush notice blood at right ankle , pressure dressing applied by EMS - bleeding controlled at arrival . He is taking warfarin for heart disease.

## 2019-06-23 NOTE — ED Notes (Signed)
Combat gauze applied

## 2019-06-23 NOTE — ED Provider Notes (Signed)
[facility] Provider Note   CSN: [id number] Arrival date & time: 06/23/19  0054     History   Chief Complaint Chief Complaint  Patient presents with  . Bleeding Vein at Ankle    HPI Mark Baker is a 38 y.o. male with history of hypertension, nonischemic cardiomyopathy, migraine headaches, CHF EF 25 to 30% presents for evaluation of acute onset, persistent superficial wound to the right lower extremity after injury last night.  He takes warfarin for paroxysmal A. fib.  Reports that he was using a new showerhead yesterday and was scrubbing out his legs while showering noticed blood at the right ankle.  The bleeding has been persistent but is controlled with a pressure dressing which was applied by EMS.  He denies any significant pain, paresthesias, or weakness.  No fever.  Reports bilateral lower extremity edema is about baseline for him and he did not take his medications last night.     The history is provided by the patient.    Past Medical History:  Diagnosis Date  . Childhood asthma    no exacerbation since age 38   . Chronic combined systolic and diastolic CHF (congestive heart failure) (HCC)    a) ECHO (11/2012): EF 40-45%, RV fx difficult to see, nl size b) ECHO (05/2014): EF 20-25%, grade 2 DD, RV mildly dilated and sys fx mod reduced  . Hypertension   . Migraine    "down to maybe 2/year now; used to have them q other week" (11/26/2017)  . NICM (nonischemic cardiomyopathy) (HCC)    a) (11/2012): normal coronaries  . OSA on CPAP    wears CPAP  . Postoperative wound infection    right knee  . Wears glasses     Patient Active Problem List   Diagnosis Date Noted  . Acute blood loss anemia   . CHF (congestive heart failure) (HCC)   . Congestion of upper respiratory tract   . Debility 07/30/2018  . AKI (acute kidney injury) (HCC)   . Supplemental oxygen dependent   . PAF (paroxysmal atrial fibrillation) (HCC)   . Benign essential  HTN   . Acute respiratory failure with hypoxia and hypercapnia (HCC) 07/25/2018  . Infection of right knee (HCC) 07/23/2018  . Rupture of right quadriceps muscle 06/10/2018  . Quadriceps tendon rupture, right, initial encounter 06/10/2018  . Vitamin D deficiency 12/10/2017  . Volume overload 11/25/2017  . Syncope 01/01/2017  . Erectile dysfunction 12/25/2016  . Paroxysmal atrial fibrillation (HCC)   . Chronic HFrEF (heart failure with reduced ejection fraction) (HCC) 07/05/2016  . Morbid obesity with body mass index (BMI) of 60.0 to 69.9 in adult [facility](HCC) 07/05/2016  . CKD (chronic kidney disease), stage III (HCC) 07/05/2016  . Hyperlipidemia 11/25/2012  . Prolonged QT interval 11/24/2012  . Healthcare maintenance 09/03/2012  . HTN (hypertension) 02/19/2012  . Sleep apnea 02/10/2012  . Morbid obesity (HCC) 02/10/2012    Past Surgical History:  Procedure Laterality Date  . CARDIOVERSION N/A 11/07/2016   Procedure: CARDIOVERSION;  Surgeon: Dolores Pattyaniel R Bensimhon, MD;  [facility];  Service: Cardiovascular;  Laterality: N/A;  Will need extra help positioning patient  . INCISION AND DRAINAGE OF WOUND Right 07/23/2018   Procedure: Right knee  incisional irrigation and debridement;  Surgeon: Yolonda Kidaogers, Jason Patrick, MD;  [facility];  Service: Orthopedics;  Laterality: Right;  . KNEE ARTHROSCOPY Right 2003   ANTERIOR CRUCIATE LIGAMENT REPAIR   . LEFT HEART CATHETERIZATION WITH CORONARY ANGIOGRAM  N/A 11/24/2012   Procedure: LEFT HEART CATHETERIZATION WITH CORONARY ANGIOGRAM;  Surgeon: Peter M Swaziland, MD;  Location: [facility] CATH LAB;  Service: Cardiovascular;  Laterality: N/A;  . QUADRICEPS TENDON REPAIR Right 06/10/2018   Procedure: REPAIR QUADRICEP TENDON;  Surgeon: Yolonda Kida, MD;  [facility];  Service: Orthopedics;  Laterality: Right;  . RIGHT/LEFT HEART CATH AND CORONARY ANGIOGRAPHY N/A 03/18/2017   Procedure: Right/Left Heart Cath and Coronary Angiography;  Surgeon: Dolores Patty, MD;  Location: [facility] INVASIVE CV LAB;  Service: Cardiovascular;  Laterality: N/A;  . TEE WITHOUT CARDIOVERSION N/A 11/07/2016   Procedure: TRANSESOPHAGEAL ECHOCARDIOGRAM (TEE);  Surgeon: Dolores Patty, MD;  Location: [facility] OR;  Service: Cardiovascular;  Laterality: N/A;        Home Medications    Prior to Admission medications   Medication Sig Start Date End Date Taking? Authorizing Provider  acetaminophen (TYLENOL) 325 MG tablet Take 1-2 tablets (325-650 mg total) by mouth every 4 (four) hours as needed for mild pain. 08/04/18   Love, [name], PA-C  amiodarone (PACERONE) 200 MG tablet Take 1 tablet (200 mg total) by mouth 2 (two) times daily. 08/25/18   Clegg, Amy D, NP  atorvastatin (LIPITOR) 40 MG tablet TAKE 1 TABLET BY MOUTH DAILY 06/08/19   Clegg, Amy D, NP  carvedilol (COREG) 6.25 MG tablet Take 1 tablet (6.25 mg total) by mouth 2 (two) times daily. 08/25/18   Clegg, Amy D, NP  digoxin (LANOXIN) 0.25 MG tablet Take 1 tablet (0.25 mg total) by mouth daily. 06/09/19   Bensimhon, Bevelyn Buckles, MD  hydrALAZINE (APRESOLINE) 50 MG tablet Take 1 tablet (50 mg total) by mouth every 8 (eight) hours. 08/25/18   Clegg, Amy D, NP  isosorbide mononitrate (IMDUR) 60 MG 24 hr tablet Take 1 tablet (60 mg total) by mouth daily. 06/09/19   Bensimhon, Bevelyn Buckles, MD  losartan (COZAAR) 25 MG tablet Take 1 tablet (25 mg total) by mouth daily. 10/15/18 01/13/19  Bensimhon, Bevelyn Buckles, MD  Potassium Chloride ER 20 MEQ TBCR Take 20 mEq by mouth daily as needed (cramping). 08/25/18   Clegg, Amy D, NP  sildenafil (VIAGRA) 50 MG tablet Take 1 tablet (50 mg total) by mouth daily as needed for erectile dysfunction. 10/15/18   Bensimhon, Bevelyn Buckles, MD  spironolactone (ALDACTONE) 25 MG tablet Take 0.5 tablets (12.5 mg total) by mouth daily. 04/27/19   Clegg, Amy D, NP  torsemide (DEMADEX) 20 MG tablet TAKE 2 TABLETS BY MOUTH AM AND 1 TABLET BY MOUTH EVERY EVENING 04/27/19   Bensimhon, Bevelyn Buckles, MD  warfarin (COUMADIN) 4 MG  tablet Take 1 tablet (4 mg total) by mouth daily at 6 PM. 08/04/18   Love, [name], PA-C    Family History Family History  Problem Relation Age of Onset  . Hypertension Father   . Heart failure Father        paternal side of family   . Hypertension Sister   . Stroke Maternal Grandmother   . Hypertension Other        maternal side of family   . Hypertension Mother   . Diabetes Neg Hx   . Hyperlipidemia Neg Hx     Social History Social History   Tobacco Use  . Smoking status: Never Smoker  . Smokeless tobacco: Never Used  Substance Use Topics  . Alcohol use: Not Currently    Alcohol/week: 0.0 standard drinks    Comment: 11/26/2017 "nothing since 07/22/18  . Drug use: Never  Allergies   Aspirin and Entresto [sacubitril-valsartan]   Review of Systems Review of Systems  Constitutional: Negative for fever.  Skin: Positive for wound.  Neurological: Negative for weakness and numbness.  Hematological: Bruises/bleeds easily.  All other systems reviewed and are negative.    Physical Exam Updated Vital Signs BP (!) 134/98   Pulse 76   Temp 98.1 F (36.7 C) (Oral)   Resp 16   SpO2 97%   Physical Exam Vitals signs and nursing note reviewed.  Constitutional:      General: He is not in acute distress.    Appearance: He is well-developed. He is obese.     Comments: Morbidly obese resting comfortably in bed  HENT:     Head: Normocephalic and atraumatic.  Eyes:     General:        Right eye: No discharge.        Left eye: No discharge.     Conjunctiva/sclera: Conjunctivae normal.  Neck:     Vascular: No JVD.     Trachea: No tracheal deviation.  Cardiovascular:     Rate and Rhythm: Normal rate.     Pulses: Normal pulses.     Comments: 2+ DP/PT pulses bilaterally.  4+ pitting edema of the bilateral lower extremities.  He has a punctate skin break to the right lower leg which is spurting blood.  Bleeding controlled with pressure dressing.  Once hemostasis was  achieved I was able to observe that the patient had a 4 mm extremely superficial laceration to the right lower leg, multiple varicose veins noted on examination. Pulmonary:     Effort: Pulmonary effort is normal.  Abdominal:     General: There is no distension.  Musculoskeletal:        General: No tenderness.  Skin:    General: Skin is warm and dry.     Capillary Refill: Capillary refill takes less than 2 seconds.     Findings: No erythema.  Neurological:     Mental Status: He is alert.     Comments: Sensation intact to light touch of bilateral lower extremities.  Moves all extremities spontaneously without difficulty.  Psychiatric:        Behavior: Behavior normal.      ED Treatments / Results  Labs (all labs ordered are listed, but only abnormal results are displayed) Labs Reviewed  CBC WITH DIFFERENTIAL/PLATELET - Abnormal; Notable for the following components:      Result Value   Hemoglobin 12.1 (*)    MCHC 29.9 (*)    All other components within normal limits  BASIC METABOLIC PANEL - Abnormal; Notable for the following components:   Glucose, Bld 130 (*)    BUN 22 (*)    Creatinine, Ser 1.72 (*)    GFR calc non Af Amer 49 (*)    GFR calc Af Amer 57 (*)    All other components within normal limits  PROTIME-INR - Abnormal; Notable for the following components:   Prothrombin Time 19.1 (*)    INR 1.6 (*)    All other components within normal limits    EKG None  Radiology No results found.  Procedures .Marland Kitchen.Laceration Repair  Date/Time: 06/23/2019 11:49 AM Performed by: Jeanie SewerFawze, Astou Lada A, PA-C Authorized by: Jeanie SewerFawze, Thalia Turkington A, PA-C   Consent:    Consent obtained:  Verbal   Consent given by:  Patient   Risks discussed:  Infection, need for additional repair, pain, poor cosmetic result and poor wound healing   Alternatives  discussed:  No treatment and delayed treatment Universal protocol:    Procedure explained and questions answered to patient or proxy's satisfaction:  yes     Relevant documents present and verified: yes     Test results available and properly labeled: yes     Imaging studies available: yes     Required blood products, implants, devices, and special equipment available: yes     Site/side marked: yes     Immediately prior to procedure, a time out was called: yes     Patient identity confirmed:  Verbally with patient Anesthesia (see MAR for exact dosages):    Anesthesia method:  Local infiltration   Local anesthetic:  Lidocaine 2% w/o epi Laceration details:    Length (cm):  0.3   Depth (mm):  1 Pre-procedure details:    Preparation:  Patient was prepped and draped in usual sterile fashion Exploration:    Hemostasis achieved with:  Direct pressure, tourniquet and tied off vessels (combat gauze)   Wound exploration: wound explored through full range of motion and entire depth of wound probed and visualized     Wound extent: vascular damage     Wound extent: no areolar tissue violation noted and no underlying fracture noted     Contaminated: no   Treatment:    Area cleansed with:  Betadine   Amount of cleaning:  Standard Skin repair:    Repair method:  Sutures   Suture size:  3-0   Wound skin closure material used: vicryl rapide.   Suture technique: figure of 8.   Number of sutures:  1 Approximation:    Approximation:  Close Post-procedure details:    Dressing:  Sterile dressing   Patient tolerance of procedure:  Tolerated well, no immediate complications   (including critical care time)  Medications Ordered in ED Medications  lidocaine-EPINEPHrine (XYLOCAINE W/EPI) 2 %-1:200000 (PF) injection 10 mL (10 mLs Infiltration Given 06/23/19 1148)     Initial Impression / Assessment and Plan / ED Course  I have reviewed the triage vital signs and the nursing notes.  Pertinent labs & imaging results that were available during my care of the patient were reviewed by me and considered in my medical decision making (see chart for  details).        Patient anticoagulated on warfarin presents for evaluation of superficial wound with persistent bleeding.  He is afebrile, vital signs are stable.  He is nontoxic in appearance.  He is neurovascularly intact.  On examination he is a very small superficial wound to his right lower leg, multiple varicose veins noted on examination.   Lab work reviewed by me shows no leukocytosis, mild anemia, renal insufficiency at around patient's baseline.  INR 1.6, slightly subtherapeutic for PAF.   The wound was spurting blood but hemostasis easily achieved with pressure dressing.  However with removal of pressure dressing the bleeding resumes.  Combat gauze was applied with improvement, reduced the bleeding to a slow ooze.  I placed a figure-of-eight stitch with 3-0 Vicryl Rapide with good control of bleeding. No further bleeding noted. A light sterile dressing was applied.  Patient tolerated procedure difficulty.  He remains hemodynamically stable in no distress.  Discussed wound care.  Recommend follow-up with PCP or cardiology for reevaluation.  Discussed strict ED return precautions. Patient verbalized understanding of and agreement with plan and is safe for discharge home at this time.   Final Clinical Impressions(s) / ED Diagnoses   Final diagnoses:  Bleeding from wound  ED Discharge Orders    None       Debroah Baller 06/23/19 1531    Isla Pence, MD 06/23/19 1540

## 2019-07-05 ENCOUNTER — Other Ambulatory Visit (HOSPITAL_COMMUNITY): Payer: Self-pay

## 2019-07-05 MED ORDER — SILDENAFIL CITRATE 50 MG PO TABS: 50.0000 mg | ORAL_TABLET | Freq: Every day | ORAL | 0 refills | Status: DC | PRN

## 2019-07-06 ENCOUNTER — Other Ambulatory Visit (HOSPITAL_COMMUNITY): Payer: Self-pay

## 2019-07-06 MED ORDER — SILDENAFIL CITRATE 50 MG PO TABS: 50.0000 mg | ORAL_TABLET | Freq: Every day | ORAL | 0 refills | Status: DC | PRN

## 2019-07-07 ENCOUNTER — Other Ambulatory Visit (HOSPITAL_COMMUNITY): Payer: Self-pay

## 2019-07-07 MED ORDER — SILDENAFIL CITRATE 50 MG PO TABS: 50.0000 mg | ORAL_TABLET | Freq: Every day | ORAL | 0 refills | Status: DC | PRN

## 2019-07-19 ENCOUNTER — Other Ambulatory Visit: Payer: Self-pay | Admitting: Physical Medicine and Rehabilitation

## 2019-08-20 ENCOUNTER — Telehealth (HOSPITAL_COMMUNITY): Payer: Self-pay | Admitting: Pharmacist

## 2019-08-20 MED ORDER — CARVEDILOL 6.25 MG PO TABS: 6.2500 mg | ORAL_TABLET | Freq: Two times a day (BID) | ORAL | 3 refills | Status: DC

## 2019-08-20 MED ORDER — ISOSORBIDE MONONITRATE ER 60 MG PO TB24: 60.0000 mg | ORAL_TABLET | Freq: Every day | ORAL | 3 refills | Status: DC

## 2019-08-20 MED ORDER — ATORVASTATIN CALCIUM 40 MG PO TABS: 40.0000 mg | ORAL_TABLET | Freq: Every day | ORAL | 3 refills | Status: DC

## 2019-08-20 MED ORDER — TORSEMIDE 20 MG PO TABS: ORAL_TABLET | ORAL | 3 refills | Status: DC

## 2019-08-20 MED ORDER — SPIRONOLACTONE 25 MG PO TABS: 12.5000 mg | ORAL_TABLET | Freq: Every day | ORAL | 3 refills | Status: DC

## 2019-08-20 MED ORDER — AMIODARONE HCL 200 MG PO TABS: 200.0000 mg | ORAL_TABLET | Freq: Two times a day (BID) | ORAL | 3 refills | Status: DC

## 2019-08-20 MED ORDER — DIGOXIN 250 MCG PO TABS: 0.2500 mg | ORAL_TABLET | Freq: Every day | ORAL | 3 refills | Status: DC

## 2019-08-20 MED ORDER — HYDRALAZINE HCL 50 MG PO TABS: 50.0000 mg | ORAL_TABLET | Freq: Three times a day (TID) | ORAL | 3 refills | Status: DC

## 2019-08-20 MED ORDER — LOSARTAN POTASSIUM 25 MG PO TABS: 25.0000 mg | ORAL_TABLET | Freq: Every day | ORAL | 3 refills | Status: DC

## 2019-08-20 NOTE — Telephone Encounter (Signed)
Sent in refills for HF medications to [facility] per patient request.  Karle Plumber, PharmD, BCPS, BCCP, CPP Heart Failure Clinic Pharmacist [phone]

## 2019-09-13 ENCOUNTER — Other Ambulatory Visit (HOSPITAL_COMMUNITY): Payer: Self-pay | Admitting: *Deleted

## 2019-09-13 MED ORDER — SILDENAFIL CITRATE 50 MG PO TABS: 50.0000 mg | ORAL_TABLET | Freq: Every day | ORAL | 0 refills | Status: DC | PRN

## 2019-11-11 ENCOUNTER — Telehealth (HOSPITAL_COMMUNITY): Payer: Self-pay | Admitting: Pharmacist

## 2019-11-11 NOTE — Telephone Encounter (Signed)
Advanced Heart Failure Patient Advocate Encounter  Prior Authorization for Sildenafil has been approved.    PA# [id number] Effective dates: 10/12/19 through 11/10/20  Karle Plumber, PharmD, BCPS, BCCP, CPP Heart Failure Clinic Pharmacist [phone]

## 2019-11-15 ENCOUNTER — Other Ambulatory Visit (HOSPITAL_COMMUNITY): Payer: Self-pay

## 2019-11-15 MED ORDER — SILDENAFIL CITRATE 50 MG PO TABS: 50.0000 mg | ORAL_TABLET | Freq: Every day | ORAL | 0 refills | Status: DC | PRN

## 2019-12-05 IMAGING — DX DG CHEST 1V
1 series · 1 of 1 positions shown · non-contrast
Comparison: 06/13/2017.

CLINICAL DATA: Syncope.  Chest pain.  History of CHF.

EXAM:
CHEST  1 VIEW

[chest ap]
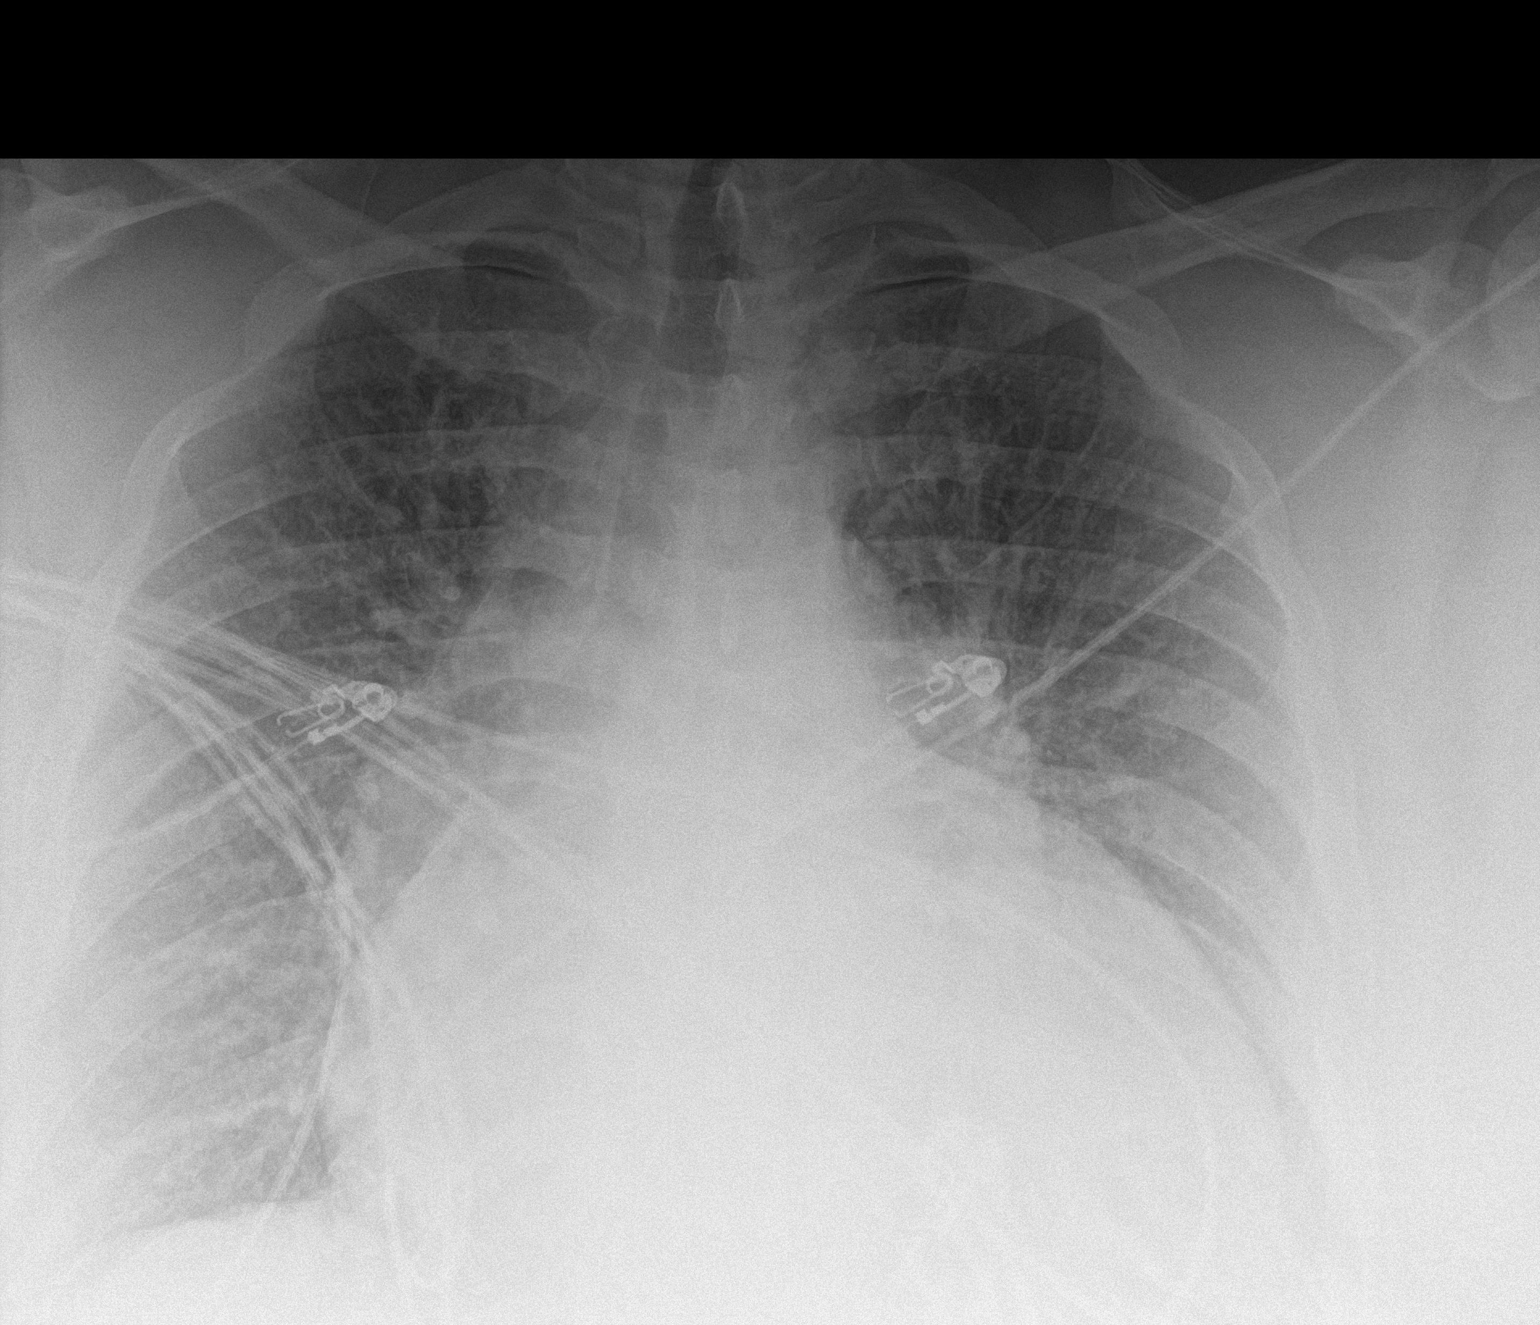

[1 of 1 positions shown; findings below may reference images not displayed]

FINDINGS: Cardiomegaly with diffuse bilateral pulmonary interstitial
prominence consistent with CHF. Costophrenic angles and right
lateral chest not imaged. No pneumothorax.
IMPRESSION: Congestive heart failure with bilateral pulmonary interstitial
edema.

## 2019-12-28 ENCOUNTER — Other Ambulatory Visit: Payer: Self-pay

## 2020-01-03 ENCOUNTER — Other Ambulatory Visit (HOSPITAL_COMMUNITY): Payer: Self-pay

## 2020-01-03 MED ORDER — HYDRALAZINE HCL 50 MG PO TABS: 50.0000 mg | ORAL_TABLET | Freq: Three times a day (TID) | ORAL | 1 refills | Status: DC

## 2020-01-31 MED ORDER — CARVEDILOL 6.25 MG PO TABS: 6.2500 mg | ORAL_TABLET | Freq: Two times a day (BID) | ORAL | 0 refills | Status: DC

## 2020-02-11 ENCOUNTER — Other Ambulatory Visit (HOSPITAL_COMMUNITY): Payer: Self-pay | Admitting: *Deleted

## 2020-02-11 MED ORDER — AMIODARONE HCL 200 MG PO TABS: 200.0000 mg | ORAL_TABLET | Freq: Two times a day (BID) | ORAL | 3 refills | Status: DC

## 2020-02-18 ENCOUNTER — Telehealth (HOSPITAL_COMMUNITY): Payer: Self-pay | Admitting: *Deleted

## 2020-02-18 NOTE — Telephone Encounter (Signed)
Paperwork faxed to [email or web address] for [employer]. Fax [phone] fax confirmation received. Paperwork emailed to [email or web address] as well.

## 2020-02-21 ENCOUNTER — Other Ambulatory Visit (HOSPITAL_COMMUNITY): Payer: Self-pay | Admitting: *Deleted

## 2020-02-21 MED ORDER — ATORVASTATIN CALCIUM 40 MG PO TABS: 40.0000 mg | ORAL_TABLET | Freq: Every day | ORAL | 3 refills | Status: DC

## 2020-02-28 ENCOUNTER — Telehealth (HOSPITAL_COMMUNITY): Payer: Self-pay | Admitting: Vascular Surgery

## 2020-02-28 ENCOUNTER — Telehealth (HOSPITAL_COMMUNITY): Payer: Self-pay | Admitting: *Deleted

## 2020-02-28 NOTE — Telephone Encounter (Signed)
Received paperwork from [facility], per patient the last paperwork sent in was late and so new forms are needed, they are due by 7/22. New forms completed and placed for Dr Gala Romney to review and sign.

## 2020-02-28 NOTE — Telephone Encounter (Signed)
Left pt VM giving f/u appt and echo w/ db 9/30 echo @ 10 db appt @ 11am, asked pt to call back to confirm appt

## 2020-03-26 ENCOUNTER — Other Ambulatory Visit (HOSPITAL_COMMUNITY): Payer: Self-pay | Admitting: Adult Health

## 2020-03-30 ENCOUNTER — Other Ambulatory Visit (HOSPITAL_COMMUNITY): Payer: Self-pay

## 2020-04-29 ENCOUNTER — Other Ambulatory Visit: Payer: Self-pay | Admitting: Internal Medicine

## 2020-05-11 ENCOUNTER — Ambulatory Visit (HOSPITAL_COMMUNITY): Payer: Self-pay

## 2020-05-11 ENCOUNTER — Encounter (HOSPITAL_COMMUNITY): Payer: Self-pay | Admitting: Internal Medicine

## 2020-05-15 ENCOUNTER — Encounter (HOSPITAL_COMMUNITY): Payer: Self-pay | Admitting: *Deleted

## 2020-05-15 NOTE — Progress Notes (Signed)
Pt's FMLA forms completed for intermittent leave for episode flare-ups and appt, signed by Dr Gala Romney and faxed back to [place] at [phone], mychart message sent to pt to let him know this was done

## 2020-06-23 ENCOUNTER — Other Ambulatory Visit (HOSPITAL_COMMUNITY): Payer: Self-pay | Admitting: Cardiology

## 2020-06-23 MED ORDER — POTASSIUM CHLORIDE ER 20 MEQ PO TBCR: 20.0000 meq | EXTENDED_RELEASE_TABLET | Freq: Every day | ORAL | 0 refills | Status: DC | PRN

## 2020-06-23 MED ORDER — LOSARTAN POTASSIUM 25 MG PO TABS
25.0000 mg | ORAL_TABLET | Freq: Every day | ORAL | 0 refills | Status: DC
Start: 2020-06-23 — End: 2020-12-19

## 2020-06-23 MED ORDER — DIGOXIN 250 MCG PO TABS: 250.0000 ug | ORAL_TABLET | Freq: Every day | ORAL | 0 refills | Status: DC

## 2020-06-23 MED ORDER — SPIRONOLACTONE 25 MG PO TABS
12.5000 mg | ORAL_TABLET | Freq: Every day | ORAL | 0 refills | Status: DC
Start: 2020-06-23 — End: 2020-08-23

## 2020-06-23 MED ORDER — AMIODARONE HCL 200 MG PO TABS
200.0000 mg | ORAL_TABLET | Freq: Two times a day (BID) | ORAL | 0 refills | Status: DC
Start: 2020-06-23 — End: 2020-08-07

## 2020-06-23 MED ORDER — TORSEMIDE 20 MG PO TABS: ORAL_TABLET | ORAL | 0 refills | Status: DC

## 2020-06-23 MED ORDER — CARVEDILOL 6.25 MG PO TABS: ORAL_TABLET | ORAL | 0 refills | Status: DC

## 2020-06-23 MED ORDER — ISOSORBIDE MONONITRATE ER 60 MG PO TB24
60.0000 mg | ORAL_TABLET | Freq: Every day | ORAL | 0 refills | Status: DC
Start: 2020-06-23 — End: 2020-12-14

## 2020-06-23 MED ORDER — ATORVASTATIN CALCIUM 40 MG PO TABS
40.0000 mg | ORAL_TABLET | Freq: Every day | ORAL | 0 refills | Status: DC
Start: 2020-06-23 — End: 2020-09-07

## 2020-06-23 MED ORDER — HYDRALAZINE HCL 50 MG PO TABS
50.0000 mg | ORAL_TABLET | Freq: Three times a day (TID) | ORAL | 0 refills | Status: DC
Start: 2020-06-23 — End: 2020-07-14

## 2020-07-02 NOTE — Progress Notes (Signed)
--- Patient did not show for appointment. Note left for templating purposes only. --    Advanced Heart Failure Clinic Note   Date:  07/02/2020   ID:  Mark Baker, DOB 06/07/1981, MRN [id number]  Location: Home  Provider location: [facility] Type of Visit: Established patient  PCP:  Sherren Mocha, MD  Cardiologist:  No primary care provider on file. Primary HF: Cas Tracz  Chief Complaint: Heart Failure follow-up   History of Present Illness:  HPI: Mark Baker is a 39 y.o. male with a past medical history of super morbid obesity, chronic combined systolic and diastolic CHF (EF 24-40%) due to NICM, OSA, PAF, and medical noncompliance.   Admitted 3/18 with atrial fibrillation RVR and low output (co ox 38%). Started on Amiodarone and milrinone. TEE: EF 20% RV severely HK Massive LA (7.7cm), Severely dilated RA, Normal AV, Severe central MR, Moderate TR (RVSP 65-70), Trivial PR.  He chemically converted to NSR. He diuresed 44 pounds, discharge weight was 477 pounds. Discharged on torsemide 40mg  daily. Renal function inhibited starting ARB/ARNI.   Admitted 5/18 with syncope, event was triggered by an argument at home. Outpatient monitor showed NSR with occasional PVCs. No recurrent AF or other arrhythmias noted.   Admitted 4/19 with CP and syncope. EKG unchanged and cardiac enzymes were flat. Orthostatics negative. Weight was up 30 lbs and he was diuresed with IV lasix, then transitioned to torsemide 40 mg am, 20 mg pm. Losartan was stopped and he was started on Entresto. DC weight: 499 lbs.   Saw Dr 5/19 for possible ICD. He had recommendations to lose weight due to higher complication risk.   Had right quad tendon repair in 10/19. Admitted 12/19 with post op infection with purulent drainage from wound. He underwent I & D right knee by Dr. 1/20.  Here for routine HF visit.  At last visit losartan restarted (refused Entresto due to cough)     Holter Monitor 01/2017: NSR with occasional PVCs. No recurrent AF or arrhythmias.   R/LHC 03/2017:  Mid RCA lesion, 20 %stenosed.  Findings:  Ao = 118/88 (97) LV = 132/28 RA = 9 RV = 54/9 PA = 51/20 (34) PCW = 22 Fick cardiac output/index = 9.9/3.2 PVR = 1.2 WU SVR = 708 dynes Ao sat = 88% PA sat = 68%,   Assessment: 1. Minimal non-obstructive CAD 2. Mild pulmonary HTN 3. NICM with EF ~25-30% though hard to assess EF on LV gram due to poor opacification  Echo 11/2017: EF 20-25%, moderate LVH, diffuse HK, akinesis of the inferolateral, inferior, and inferoseptalmyocardium, mild MR, severe dilation of RA and LA  Review of systems complete and found to be negative unless listed in HPI.   Past Medical History:  Diagnosis Date  . Childhood asthma    no exacerbation since age 37   . Chronic combined systolic and diastolic CHF (congestive heart failure) (HCC)    a) ECHO (11/2012): EF 40-45%, RV fx difficult to see, nl size b) ECHO (05/2014): EF 20-25%, grade 2 DD, RV mildly dilated and sys fx mod reduced  . Hypertension   . Migraine    "down to maybe 2/year now; used to have them q other week" (11/26/2017)  . NICM (nonischemic cardiomyopathy) (HCC)    a) (11/2012): normal coronaries  . OSA on CPAP    wears CPAP  . Postoperative wound infection    right knee  . Wears glasses  Past Surgical History:  Procedure Laterality Date  . CARDIOVERSION N/A 11/07/2016   Procedure: CARDIOVERSION;  Surgeon: Dolores Patty, MD;  Location: [facility] OR;  Service: Cardiovascular;  Laterality: N/A;  Will need extra help positioning patient  . INCISION AND DRAINAGE OF WOUND Right 07/23/2018   Procedure: Right knee  incisional irrigation and debridement;  Surgeon: Yolonda Kida, MD;  Location: [facility] OR;  Service: Orthopedics;  Laterality: Right;  . KNEE ARTHROSCOPY Right 2003   ANTERIOR CRUCIATE LIGAMENT REPAIR   . LEFT HEART CATHETERIZATION WITH CORONARY ANGIOGRAM N/A 11/24/2012    Procedure: LEFT HEART CATHETERIZATION WITH CORONARY ANGIOGRAM;  Surgeon: Peter M Swaziland, MD;  Location: [facility] CATH LAB;  Service: Cardiovascular;  Laterality: N/A;  . QUADRICEPS TENDON REPAIR Right 06/10/2018   Procedure: REPAIR QUADRICEP TENDON;  Surgeon: Yolonda Kida, MD;  Location: [facility] OR;  Service: Orthopedics;  Laterality: Right;  . RIGHT/LEFT HEART CATH AND CORONARY ANGIOGRAPHY N/A 03/18/2017   Procedure: Right/Left Heart Cath and Coronary Angiography;  Surgeon: Dolores Patty, MD;  Location: [facility] INVASIVE CV LAB;  Service: Cardiovascular;  Laterality: N/A;  . TEE WITHOUT CARDIOVERSION N/A 11/07/2016   Procedure: TRANSESOPHAGEAL ECHOCARDIOGRAM (TEE);  Surgeon: Dolores Patty, MD;  Location: [facility] OR;  Service: Cardiovascular;  Laterality: N/A;     Current Outpatient Medications  Medication Sig Dispense Refill  . acetaminophen (TYLENOL) 325 MG tablet Take 1-2 tablets (325-650 mg total) by mouth every 4 (four) hours as needed for mild pain.    Marland Kitchen amiodarone (PACERONE) 200 MG tablet Take 1 tablet (200 mg total) by mouth 2 (two) times daily. MUST KEEP UPCOMING APPT FOR ADDITIONAL REFILLS 60 tablet 0  . atorvastatin (LIPITOR) 40 MG tablet Take 1 tablet (40 mg total) by mouth daily. MUST KEEP UPCOMING APPT FOR ADDITIONAL REFILLS 30 tablet 0  . carvedilol (COREG) 6.25 MG tablet TAKE 1 TABLET(6.25 MG) BY MOUTH TWICE DAILYMUST KEEP UPCOMING APPT FOR ADDITIONAL REFILLS 60 tablet 0  . digoxin (LANOXIN) 0.25 MG tablet Take 1 tablet (250 mcg total) by mouth daily. MUST KEEP UPCOMING APPT FOR ADDITIONAL REFILLS 30 tablet 0  . hydrALAZINE (APRESOLINE) 50 MG tablet Take 1 tablet (50 mg total) by mouth every 8 (eight) hours. MUST KEEP UPCOMING APPT FOR ADDITIONAL REFILLS 90 tablet 0  . isosorbide mononitrate (IMDUR) 60 MG 24 hr tablet Take 1 tablet (60 mg total) by mouth daily. MUST KEEP UPCOMING APPT FOR ADDITIONAL REFILLS 30 tablet 0  . losartan (COZAAR) 25 MG tablet Take 1 tablet (25 mg total) by  mouth daily. MUST KEEP UPCOMING APPT FOR ADDITIONAL REFILLS 30 tablet 0  . Potassium Chloride ER 20 MEQ TBCR Take 20 mEq by mouth daily as needed (cramping). MUST KEEP UPCOMING APPT FOR ADDITIONAL REFILLS 60 tablet 0  . sildenafil (VIAGRA) 50 MG tablet Take 1 tablet (50 mg total) by mouth daily as needed for erectile dysfunction. 10 tablet 0  . spironolactone (ALDACTONE) 25 MG tablet Take 0.5 tablets (12.5 mg total) by mouth daily. MUST KEEP UPCOMING APPT FOR ADDITIONAL REFILLS 15 tablet 0  . torsemide (DEMADEX) 20 MG tablet TAKE 2 TABLETS BY MOUTH AM AND 1 TABLET BY MOUTH EVERY EVENING MUST KEEP UPCOMING APPT FOR ADDITIONAL REFILLS 90 tablet 0  . warfarin (COUMADIN) 4 MG tablet Take 1 tablet (4 mg total) by mouth daily at 6 PM. 30 tablet 0   No current facility-administered medications for this encounter.    Allergies:   Aspirin and Entresto [sacubitril-valsartan]   Social History:  The patient  reports that he has never smoked. He has never used smokeless tobacco. He reports previous alcohol use. He reports that he does not use drugs.   Family History:  The patient's family history includes Heart failure in his father; Hypertension in his father, mother, sister, and another family member; Stroke in his maternal grandmother.   ROS:  Please see the history of present illness.   All other systems are personally reviewed and negative.   Exam:  (Video/Tele Health Call; Exam is subjective and or/visual.) General:  Speaks in full sentences. No resp difficulty. Lungs: Normal respiratory effort with conversation.  Abdomen: Non-distended per patient report Extremities: Pt denies edema. Neuro: Alert & oriented x 3.   Recent Labs: No results found for requested labs within last 8760 hours.  Personally reviewed   Wt Readings from Last 3 Encounters:  10/15/18 (!) 210.7 kg (464 lb 6.4 oz)  07/30/18 (!) 235 kg (518 lb 1.3 oz)  07/23/18 (!) 238.6 kg (526 lb 0.3 oz)      ASSESSMENT AND  PLAN:  1. Chronic combined systolic and diastolic CHF: Echo 03/2017 EF 30-35%, RV not well visualized.  10/2016 EF 20-25% NICM, likely tachymediated although noncompliance may play a role. Echo 11/2017: EF 20-25%, moderate LVH, diffuse HK, mild MR, severe dilation of RA and LA - Echo 12/19 EF 25-30% - NYHA II. Volume status stable. - Continue torsemide 20/40 - Digoxin 0.25 mcg daily - Hydralazine 50 mg TID, Continue imdur 60 mg daily - Spiro 12.5 mg daily - Coreg 3.125mg  BID - Restart losartan 25 mg daily -Refuses Entresto due to severe cough -Evaluate by EP for ICD however he needed lose weight for consideration.  BMET today in and in 7 days.    2. Obesity: - Body mass index is 58.05 kg/m.  - Reinforced portion control and exercise.  - Congratulated him on weight loss and urged him to continue  3. OSA - Continue CPAP nightly.   4. CKD stage III - Will check BMET today - Baseline 1.3-1.5.   5. PAF - ECG today NSR 78 bpm Personally reviewed - Continue amio 200 bid. Can consider decreasing to 200 daily at next visit. - This patients CHA2DS2-VASc Score and unadjusted Ischemic Stroke Rate (% per year) is equal to 2.2 % stroke rate/year from a score of 2 Above score calculated as 1 point each if present [CHF, HTN, DM, Vascular=MI/PAD/Aortic Plaque, Age if 65-74, or Male], 2 points each if present [Age > 75, or Stroke/TIA/TE] - Continue coumadin for anticoagulation.  - PCP managing INR. No bleeding,.  - Check amio labs    6. Erectile dysfunction - Continue sildenafil. He understands he should not take imdur if he plan to take sildenafil.    7. HTN - Blood pressure well controlled. Continue current regimen.  Signed, [name], MD  07/02/2020 10:17 PM  Advanced Heart Failure Clinic [facility] [address] Heart and Vascular [place] [address] [phone] (office) [phone] (fax)

## 2020-07-03 ENCOUNTER — Inpatient Hospital Stay (HOSPITAL_COMMUNITY)
Admission: RE | Admit: 2020-07-03 | Discharge: 2020-07-03 | Disposition: A | Discharge status: Home / Self Care | Payer: Self-pay | Source: Ambulatory Visit | Attending: Internal Medicine | Admitting: Internal Medicine

## 2020-07-03 ENCOUNTER — Ambulatory Visit (HOSPITAL_COMMUNITY)
Admission: RE | Admit: 2020-07-03 | Discharge: 2020-07-03 | Disposition: A | Discharge status: Home / Self Care | Payer: Self-pay | Source: Ambulatory Visit

## 2020-07-03 DIAGNOSIS — I5022 Chronic systolic (congestive) heart failure: Secondary | ICD-10-CM

## 2020-07-03 DIAGNOSIS — I5043 Acute on chronic combined systolic (congestive) and diastolic (congestive) heart failure: Secondary | ICD-10-CM

## 2020-07-14 ENCOUNTER — Other Ambulatory Visit (HOSPITAL_COMMUNITY): Payer: Self-pay | Admitting: Internal Medicine

## 2020-07-14 ENCOUNTER — Other Ambulatory Visit (HOSPITAL_COMMUNITY): Payer: Self-pay | Admitting: *Deleted

## 2020-07-28 ENCOUNTER — Other Ambulatory Visit (HOSPITAL_COMMUNITY): Payer: Self-pay | Admitting: Internal Medicine

## 2020-08-07 ENCOUNTER — Other Ambulatory Visit: Payer: Self-pay

## 2020-08-07 MED ORDER — AMIODARONE HCL 200 MG PO TABS
200.0000 mg | ORAL_TABLET | Freq: Two times a day (BID) | ORAL | 0 refills | Status: DC
Start: 2020-08-07 — End: 2020-08-22

## 2020-08-20 ENCOUNTER — Other Ambulatory Visit: Payer: Self-pay | Admitting: Internal Medicine

## 2020-08-22 ENCOUNTER — Other Ambulatory Visit (HOSPITAL_COMMUNITY): Payer: Self-pay

## 2020-08-22 ENCOUNTER — Other Ambulatory Visit: Payer: Self-pay

## 2020-08-23 MED ORDER — AMIODARONE HCL 200 MG PO TABS
200.0000 mg | ORAL_TABLET | Freq: Two times a day (BID) | ORAL | 0 refills | Status: DC
Start: 2020-08-23 — End: 2020-09-21

## 2020-08-23 MED ORDER — SPIRONOLACTONE 25 MG PO TABS
12.5000 mg | ORAL_TABLET | Freq: Every day | ORAL | 0 refills | Status: DC
Start: 2020-08-23 — End: 2020-09-21

## 2020-08-23 MED ORDER — HYDRALAZINE HCL 50 MG PO TABS
50.0000 mg | ORAL_TABLET | Freq: Three times a day (TID) | ORAL | 0 refills | Status: DC
Start: 2020-08-23 — End: 2020-11-27

## 2020-08-23 MED ORDER — CARVEDILOL 6.25 MG PO TABS: ORAL_TABLET | ORAL | 0 refills | Status: DC

## 2020-08-23 NOTE — Telephone Encounter (Signed)
This is a CHF pt. Dr. Prescott Gum refilled this medication. Please address

## 2020-08-23 NOTE — Telephone Encounter (Signed)
This is a CHF pt, Dr. Bensimhon 

## 2020-08-24 MED ORDER — SILDENAFIL CITRATE 50 MG PO TABS: 50.0000 mg | ORAL_TABLET | Freq: Every day | ORAL | 0 refills | Status: DC | PRN

## 2020-09-01 ENCOUNTER — Other Ambulatory Visit (HOSPITAL_COMMUNITY): Payer: Self-pay

## 2020-09-01 MED ORDER — TORSEMIDE 20 MG PO TABS
ORAL_TABLET | ORAL | 0 refills | Status: DC
Start: 2020-09-01 — End: 2020-11-02

## 2020-09-07 ENCOUNTER — Other Ambulatory Visit (HOSPITAL_COMMUNITY): Payer: Self-pay

## 2020-09-07 MED ORDER — ATORVASTATIN CALCIUM 40 MG PO TABS
40.0000 mg | ORAL_TABLET | Freq: Every day | ORAL | 0 refills | Status: DC
Start: 2020-09-07 — End: 2020-10-09

## 2020-09-18 ENCOUNTER — Encounter (HOSPITAL_COMMUNITY): Payer: Self-pay | Admitting: *Deleted

## 2020-09-18 NOTE — Progress Notes (Signed)
Pt's forms for intermittent FMLA completed and signed by Dr Gala Romney, faxed into [facility] at [phone]

## 2020-09-21 ENCOUNTER — Other Ambulatory Visit: Payer: Self-pay | Admitting: Internal Medicine

## 2020-09-28 ENCOUNTER — Other Ambulatory Visit: Payer: Self-pay | Admitting: Internal Medicine

## 2020-10-02 ENCOUNTER — Ambulatory Visit (HOSPITAL_COMMUNITY): Payer: Self-pay

## 2020-10-02 ENCOUNTER — Encounter (HOSPITAL_COMMUNITY): Payer: Self-pay | Admitting: Internal Medicine

## 2020-10-06 ENCOUNTER — Other Ambulatory Visit (HOSPITAL_COMMUNITY): Payer: Self-pay | Admitting: Internal Medicine

## 2020-10-17 ENCOUNTER — Other Ambulatory Visit: Payer: Self-pay | Admitting: Internal Medicine

## 2020-10-23 ENCOUNTER — Encounter (HOSPITAL_COMMUNITY): Payer: Self-pay

## 2020-10-23 ENCOUNTER — Ambulatory Visit (HOSPITAL_COMMUNITY): Admission: RE | Admit: 2020-10-23 | Payer: Self-pay | Source: Ambulatory Visit

## 2020-10-26 ENCOUNTER — Other Ambulatory Visit: Payer: Self-pay | Admitting: Internal Medicine

## 2020-11-02 ENCOUNTER — Other Ambulatory Visit (HOSPITAL_COMMUNITY): Payer: Self-pay | Admitting: Internal Medicine

## 2020-11-10 ENCOUNTER — Other Ambulatory Visit (HOSPITAL_COMMUNITY): Payer: Self-pay | Admitting: Internal Medicine

## 2020-11-21 ENCOUNTER — Encounter (HOSPITAL_COMMUNITY): Payer: Self-pay | Admitting: *Deleted

## 2020-11-25 ENCOUNTER — Other Ambulatory Visit: Payer: Self-pay | Admitting: Internal Medicine

## 2020-11-27 ENCOUNTER — Encounter (HOSPITAL_COMMUNITY): Payer: Self-pay

## 2020-11-27 ENCOUNTER — Telehealth (HOSPITAL_COMMUNITY): Payer: Self-pay | Admitting: Cardiology

## 2020-11-27 ENCOUNTER — Ambulatory Visit (HOSPITAL_COMMUNITY): Admission: RE | Admit: 2020-11-27 | Payer: Self-pay | Source: Ambulatory Visit

## 2020-11-27 NOTE — Telephone Encounter (Signed)
Call appt reminder placed No answer unable to leave message

## 2020-11-28 ENCOUNTER — Other Ambulatory Visit (HOSPITAL_COMMUNITY): Payer: Self-pay | Admitting: Internal Medicine

## 2020-11-29 ENCOUNTER — Telehealth (HOSPITAL_COMMUNITY): Payer: Self-pay | Admitting: Internal Medicine

## 2020-12-12 ENCOUNTER — Other Ambulatory Visit (HOSPITAL_COMMUNITY): Payer: Self-pay | Admitting: Internal Medicine

## 2020-12-14 ENCOUNTER — Encounter (HOSPITAL_COMMUNITY): Payer: Self-pay | Admitting: *Deleted

## 2020-12-14 ENCOUNTER — Other Ambulatory Visit (HOSPITAL_COMMUNITY): Payer: Self-pay | Admitting: *Deleted

## 2020-12-14 MED ORDER — ISOSORBIDE MONONITRATE ER 60 MG PO TB24
60.0000 mg | ORAL_TABLET | Freq: Every day | ORAL | 0 refills | Status: DC
Start: 2020-12-14 — End: 2020-12-19

## 2020-12-19 ENCOUNTER — Encounter (HOSPITAL_COMMUNITY): Payer: Self-pay

## 2020-12-19 ENCOUNTER — Other Ambulatory Visit (HOSPITAL_COMMUNITY): Payer: Self-pay | Admitting: Cardiology

## 2020-12-19 ENCOUNTER — Other Ambulatory Visit (HOSPITAL_COMMUNITY): Payer: Self-pay

## 2020-12-19 ENCOUNTER — Telehealth (HOSPITAL_COMMUNITY): Payer: Self-pay | Admitting: Licensed Clinical Social Worker

## 2020-12-19 MED ORDER — DIGOXIN 250 MCG PO TABS
250.0000 ug | ORAL_TABLET | Freq: Every day | ORAL | 2 refills | Status: DC
Start: 2020-12-19 — End: 2021-08-09
  Filled 2020-12-19: qty 30, 30d supply, fill #0
  Filled 2021-01-31: qty 30, 30d supply, fill #1
  Filled 2021-03-14: qty 30, 30d supply, fill #2

## 2020-12-19 MED ORDER — AMIODARONE HCL 200 MG PO TABS
200.0000 mg | ORAL_TABLET | Freq: Two times a day (BID) | ORAL | 2 refills | Status: DC
Start: 2020-12-19 — End: 2021-08-09
  Filled 2020-12-19: qty 60, 30d supply, fill #0
  Filled 2021-01-31: qty 60, 30d supply, fill #1
  Filled 2021-03-14: qty 60, 30d supply, fill #2

## 2020-12-19 MED ORDER — SPIRONOLACTONE 25 MG PO TABS
0.5000 | ORAL_TABLET | Freq: Every day | ORAL | 2 refills | Status: DC
Start: 2020-12-19 — End: 2021-08-09
  Filled 2020-12-19: qty 15, 30d supply, fill #0
  Filled 2021-01-31: qty 15, 30d supply, fill #1
  Filled 2021-03-14: qty 15, 30d supply, fill #2

## 2020-12-19 MED ORDER — WARFARIN SODIUM 4 MG PO TABS
4.0000 mg | ORAL_TABLET | Freq: Every day | ORAL | 2 refills | Status: DC
  Filled 2020-12-19: qty 30, 30d supply, fill #0
  Filled 2021-01-31: qty 30, 30d supply, fill #1
  Filled 2021-03-14: qty 30, 30d supply, fill #2

## 2020-12-19 MED ORDER — ISOSORBIDE MONONITRATE ER 60 MG PO TB24
60.0000 mg | ORAL_TABLET | Freq: Every day | ORAL | 2 refills | Status: DC
  Filled 2020-12-19: qty 30, 30d supply, fill #0
  Filled 2021-01-31: qty 30, 30d supply, fill #1
  Filled 2021-03-14: qty 30, 30d supply, fill #2

## 2020-12-19 MED ORDER — CARVEDILOL 6.25 MG PO TABS
6.2500 mg | ORAL_TABLET | Freq: Two times a day (BID) | ORAL | 2 refills | Status: DC
Start: 2020-12-19 — End: 2021-02-02
  Filled 2020-12-19: qty 60, 30d supply, fill #0
  Filled 2021-01-31: qty 60, 30d supply, fill #1

## 2020-12-19 MED ORDER — HYDRALAZINE HCL 50 MG PO TABS
50.0000 mg | ORAL_TABLET | Freq: Three times a day (TID) | ORAL | 2 refills | Status: DC
Start: 2020-12-19 — End: 2021-08-09
  Filled 2020-12-19: qty 90, 30d supply, fill #0
  Filled 2021-01-31: qty 90, 30d supply, fill #1
  Filled 2021-03-14: qty 90, 30d supply, fill #2

## 2020-12-19 MED ORDER — TORSEMIDE 20 MG PO TABS
ORAL_TABLET | ORAL | 2 refills | Status: DC
Start: 2020-12-19 — End: 2021-08-09
  Filled 2020-12-19: qty 90, 30d supply, fill #0
  Filled 2021-01-31: qty 90, 30d supply, fill #1
  Filled 2021-03-14: qty 90, 30d supply, fill #2

## 2020-12-19 MED ORDER — POTASSIUM CHLORIDE CRYS ER 20 MEQ PO TBCR
20.0000 meq | EXTENDED_RELEASE_TABLET | Freq: Every day | ORAL | 2 refills | Status: DC | PRN
Start: 2020-12-19 — End: 2021-08-09
  Filled 2020-12-19: qty 30, 30d supply, fill #0
  Filled 2021-01-31: qty 30, 30d supply, fill #1
  Filled 2021-03-14: qty 30, 30d supply, fill #2

## 2020-12-19 MED ORDER — ATORVASTATIN CALCIUM 40 MG PO TABS
40.0000 mg | ORAL_TABLET | Freq: Every day | ORAL | 2 refills | Status: DC
Start: 2020-12-19 — End: 2021-08-09
  Filled 2020-12-19: qty 30, 30d supply, fill #0
  Filled 2021-01-31: qty 30, 30d supply, fill #1
  Filled 2021-03-14: qty 30, 30d supply, fill #2

## 2020-12-19 MED ORDER — LOSARTAN POTASSIUM 25 MG PO TABS
25.0000 mg | ORAL_TABLET | Freq: Every day | ORAL | 2 refills | Status: DC
Start: 2020-12-19 — End: 2021-08-09
  Filled 2020-12-19: qty 30, 30d supply, fill #0
  Filled 2021-01-31: qty 30, 30d supply, fill #1
  Filled 2021-03-14: qty 30, 30d supply, fill #2

## 2020-12-19 NOTE — Telephone Encounter (Signed)
CSW consulted to reach out to pt regarding frequent no shows and cancellations with clinic appts.  Pt reports that he lost his insurance back in March when he lost his job so has not been going to clinic appts to avoid self-pay bills.  Pt currently looking for work but has not found anything at this time.  Currently staying with his mom in [place], [place] while he is without work.  Has looked into [facility] insurance but can't find one that covers his medications sufficiently.  CSW explained other options to assist with medications such as copay cards, Good rx, or getting through [facility] $4 list.  At this time pt is almost out of medications and struggling to afford more- will be back in [place] for the rest of May so CSW requested clinic staff send medications to Outpatient pharmacy under the heart failure fund.  CSW also discussed CAFA to help with past and future [facility] bills including clinic visit.  Emailed pt CAFA and informed on how to complete and submit.  Will continue to follow and assist as needed  Burna Sis, LCSW Clinical Social Worker Advanced Heart Failure Clinic Desk#: [phone] Cell#: [phone]

## 2020-12-20 ENCOUNTER — Other Ambulatory Visit (HOSPITAL_COMMUNITY): Payer: Self-pay

## 2020-12-20 ENCOUNTER — Encounter (HOSPITAL_COMMUNITY): Payer: Self-pay

## 2020-12-21 ENCOUNTER — Other Ambulatory Visit (HOSPITAL_COMMUNITY): Payer: Self-pay

## 2020-12-28 ENCOUNTER — Telehealth (HOSPITAL_COMMUNITY): Payer: Self-pay | Admitting: Licensed Clinical Social Worker

## 2020-12-28 NOTE — Telephone Encounter (Signed)
CSW called pt to check in regarding if he was able to get his medications and if he had started working on CAFA.  Pt states that he was able to pick up meds from the Outpatient pharmacy but reports not receiving the CAFA that was emailed to him.  CSW confirmed email address and sent it to pt again.  Will continue to follow and assist as needed  Burna Sis, LCSW Clinical Social Worker Advanced Heart Failure Clinic Desk#: [phone] Cell#: [phone]

## 2021-01-31 ENCOUNTER — Other Ambulatory Visit (HOSPITAL_COMMUNITY): Payer: Self-pay

## 2021-02-01 ENCOUNTER — Other Ambulatory Visit: Payer: Self-pay | Admitting: Internal Medicine

## 2021-02-05 ENCOUNTER — Ambulatory Visit (HOSPITAL_COMMUNITY): Admission: RE | Admit: 2021-02-05 | Payer: Self-pay | Source: Ambulatory Visit

## 2021-02-05 ENCOUNTER — Encounter (HOSPITAL_COMMUNITY): Payer: Self-pay

## 2021-03-14 ENCOUNTER — Other Ambulatory Visit (HOSPITAL_COMMUNITY): Payer: Self-pay

## 2021-04-11 ENCOUNTER — Other Ambulatory Visit (HOSPITAL_COMMUNITY): Payer: Self-pay

## 2021-06-12 ENCOUNTER — Telehealth (HOSPITAL_COMMUNITY): Payer: Self-pay

## 2021-06-12 NOTE — Telephone Encounter (Signed)
Patient has moved and is unhappy with is care.  He now has insurance and would like to re-establish care.  Patient needs refills of the following:Spirolactone, Hydralazine, Torsemide, Warfarin, Digoxin, Atorvastatin, Losartan, Carvedilol, Isosorbide, Amiodarone. Follow up appointment scheduled.

## 2021-07-30 ENCOUNTER — Encounter (HOSPITAL_COMMUNITY): Payer: Self-pay

## 2021-08-03 NOTE — Progress Notes (Signed)
Advanced Heart Failure Clinic Note   Date:  08/09/2021   ID:  Mark Baker, DOB 11/14/1980, MRN [id number]  Location: Home  Provider location: [facility] Type of Visit: Established patient  PCP:  Sherren Mocha, MD  HF Cardiologist:  Dr. Gala Romney  Chief Complaint: Heart Failure follow-up   HPI:   Mark Baker is a 40 y.o.male with a past medical history of super morbid obesity, chronic combined systolic and diastolic CHF (EF 38-18%) due to NICM, OSA, PAF, and medical noncompliance.    Admitted 3/18 with atrial fibrillation RVR and low output (co ox 38%). Started on Amiodarone and milrinone. TEE: EF 20% RV severely HK Massive LA (7.7cm), Severely dilated RA, Normal AV, Severe central MR, Moderate TR (RVSP 65-70), Trivial PR.  He chemically converted to NSR. He diuresed 44 pounds, discharge weight was 477 pounds. Discharged on torsemide 40mg  daily. Renal function inhibited starting ARB/ARNI.    Admitted 5/18 with syncope, event was triggered by an argument at home. Outpatient monitor showed NSR with occasional PVCs. No recurrent AF or other arrhythmias noted.    Admitted 4/19 with CP and syncope. EKG unchanged and cardiac enzymes were flat. Orthostatics negative. Weight was up 30 lbs and he was diuresed with IV lasix, then transitioned to torsemide 40 mg am, 20 mg pm. Losartan was stopped and he was started on Entresto. DC weight: 499 lbs.    Saw Dr 5/19 for possible ICD. He had recommendations to lose weight due to higher complication risk.    Had right quad tendon repair in 10/19. Admitted 12/19 with post op infection with purulent drainage from wound. He underwent I & D right knee by Dr. 1/20.   Last seen 01/2019. Moved to [place], [place] and established cardiology care there.  Today he returns for HF follow up. He does not trust his providers he had been seeing in [place] and has driven from [place] to establish care again here. He would like to  continue to be seen by Dr.Bensimhon and the AHF team. Has not had any HF meds in 1 day, and has been trying to ration all of his medications for about a month to prevent from running out. It has been about a month since he has consistently had full dose of meds. Overall feels good. Avoids stairs since his knee surgery but no SOB walking on flat ground. Denies palpitations abnormal bleeding, CP, dizziness, edema, or PND/Orthopnea. Appetite ok. No fever or chills. Does not weigh at home. Wears CPAP at night. Works at home as [place]. Sometimes takes all of his medications at once. Will have insurance Aug 12, 2021. Does not have PCP in Aug 14, 2021 and has not had INRs checked.  Holter Monitor 01/2017: NSR with occasional PVCs. No recurrent AF or arrhythmias.    R/LHC 03/2017: Mid RCA lesion, 20 %stenosed.   Findings:   Ao = 118/88 (97) LV = 132/28 RA = 9 RV = 54/9 PA = 51/20 (34) PCW = 22 Fick cardiac output/index = 9.9/3.2 PVR = 1.2 WU SVR = 708 dynes Ao sat = 88% PA sat = 68%,    Assessment: 1. Minimal non-obstructive CAD 2. Mild pulmonary HTN 3. NICM with EF ~25-30% though hard to assess EF on LV gram due to poor opacification   Echo 11/2017: EF 20-25%, moderate LVH, diffuse HK, akinesis of the inferolateral, inferior, and inferoseptal myocardium, mild MR, severe dilation of RA and LA   Review  of systems complete and found to be negative unless listed in HPI.   Past Medical History:  Diagnosis Date   Childhood asthma    no exacerbation since age 89    Chronic combined systolic and diastolic CHF (congestive heart failure) ([place])    a) ECHO (11/2012): EF 40-45%, RV fx difficult to see, nl size b) ECHO (05/2014): EF 20-25%, grade 2 DD, RV mildly dilated and sys fx mod reduced   Hypertension    Migraine    "down to maybe 2/year now; used to have them q other week" (11/26/2017)   NICM (nonischemic cardiomyopathy) ([facility])    a) (11/2012): normal coronaries    OSA on CPAP    wears CPAP   Postoperative wound infection    right knee   Wears glasses    Past Surgical History:  Procedure Laterality Date   CARDIOVERSION N/A 11/07/2016   Procedure: CARDIOVERSION;  Surgeon: Jolaine Artist, MD;  Location: [facility];  Service: Cardiovascular;  Laterality: N/A;  Will need extra help positioning patient   INCISION AND DRAINAGE OF WOUND Right 07/23/2018   Procedure: Right knee  incisional irrigation and debridement;  Surgeon: Nicholes Stairs, MD;  Location: [facility];  Service: Orthopedics;  Laterality: Right;   KNEE ARTHROSCOPY Right 2003   ANTERIOR CRUCIATE LIGAMENT REPAIR    LEFT HEART CATHETERIZATION WITH CORONARY ANGIOGRAM N/A 11/24/2012   Procedure: LEFT HEART CATHETERIZATION WITH CORONARY ANGIOGRAM;  Surgeon: Peter M Martinique, MD;  Location: [facility] CATH LAB;  Service: Cardiovascular;  Laterality: N/A;   QUADRICEPS TENDON REPAIR Right 06/10/2018   Procedure: REPAIR QUADRICEP TENDON;  Surgeon: Nicholes Stairs, MD;  Location: [facility];  Service: Orthopedics;  Laterality: Right;   RIGHT/LEFT HEART CATH AND CORONARY ANGIOGRAPHY N/A 03/18/2017   Procedure: Right/Left Heart Cath and Coronary Angiography;  Surgeon: Jolaine Artist, MD;  Location: [facility] CV LAB;  Service: Cardiovascular;  Laterality: N/A;   TEE WITHOUT CARDIOVERSION N/A 11/07/2016   Procedure: TRANSESOPHAGEAL ECHOCARDIOGRAM (TEE);  Surgeon: Jolaine Artist, MD;  Location: [facility] OR;  Service: Cardiovascular;  Laterality: N/A;   Current Outpatient Medications  Medication Sig Dispense Refill   acetaminophen (TYLENOL) 325 MG tablet Take 1-2 tablets (325-650 mg total) by mouth every 4 (four) hours as needed for mild pain.     amiodarone (PACERONE) 200 MG tablet Take 1 tablet (200 mg total) by mouth 2 (two) times daily. 60 tablet 2   atorvastatin (LIPITOR) 40 MG tablet Take 1 tablet (40 mg total) by mouth daily. 30 tablet 2   carvedilol (COREG) 6.25 MG tablet TAKE 1 TABLET(6.25 MG) BY MOUTH  TWICE DAILY 60 tablet 0   digoxin (LANOXIN) 0.25 MG tablet Take 1 tablet (250 mcg total) by mouth daily. MUST KEEP UPCOMING APPT FOR ADDITIONAL REFILLS 30 tablet 2   hydrALAZINE (APRESOLINE) 50 MG tablet Take 1 tablet (50 mg total) by mouth 3 (three) times daily. Needs appt 90 tablet 2   isosorbide mononitrate (IMDUR) 60 MG 24 hr tablet Take 1 tablet (60 mg total) by mouth daily. 30 tablet 2   losartan (COZAAR) 25 MG tablet Take 1 tablet (25 mg total) by mouth daily. 30 tablet 2   sildenafil (VIAGRA) 50 MG tablet Take 1 tablet (50 mg total) by mouth daily as needed for erectile dysfunction. Please come to the appointment 10/02/20. We cannot do further refill if appointment not kept. 10 tablet 0   spironolactone (ALDACTONE) 25 MG tablet Take 0.5 tablets (12.5 mg total) by mouth daily.  15 tablet 2   torsemide (DEMADEX) 20 MG tablet Take 2 tablets (40 mg total) by mouth in the morning AND 1 tablet (20 mg total) every evening. 90 tablet 2   warfarin (COUMADIN) 4 MG tablet Take 1 tablet (4 mg total) by mouth daily at 6 PM. 30 tablet 2   No current facility-administered medications for this encounter.    Allergies:   Aspirin and Entresto [sacubitril-valsartan]   Social History:  The patient  reports that he has never smoked. He has never used smokeless tobacco. He reports that he does not currently use alcohol. He reports that he does not use drugs.   Family History:  The patient's family history includes Heart failure in his father; Hypertension in his father, mother, sister, and another family member; Stroke in his maternal grandmother.   ROS:  Please see the history of present illness.   All other systems are personally reviewed and negative.   Recent Labs: No results found for requested labs within last 8760 hours.  Personally reviewed   BP (!) 174/96    Pulse 94    Ht 6\' 2"  (1.88 m)    Wt (!) 227.3 kg (501 lb 3.2 oz)    SpO2 90%    BMI 64.35 kg/m   Wt Readings from Last 3 Encounters:   08/09/21 (!) 227.3 kg (501 lb 3.2 oz)  10/15/18 (!) 210.7 kg (464 lb 6.4 oz)  07/30/18 (!) 235 kg (518 lb 1.3 oz)    Physical Exam General:  NAD. No resp difficulty, walked into clinic, super-morbid obese HEENT: Normal Neck: Supple. Thick neck. Carotids 2+ bilat; no bruits. No lymphadenopathy or thryomegaly appreciated. Cor: PMI laterally displaced. Regular rate & rhythm. No rubs, gallops or murmurs. Heart tones distant. Lungs: Clear Abdomen: Obese, nontender, nondistended. No hepatosplenomegaly. No bruits or masses. Good bowel sounds. Extremities: No cyanosis, clubbing, rash, trace BLE edema, varicose veins RLL Neuro: Alert & oriented x 3, cranial nerves grossly intact. Moves all 4 extremities w/o difficulty. Affect pleasant.  ECG: SR 1st degree AVB PR 214 msec, w/ PVC LBBB (personally reviewed)  ASSESSMENT AND PLAN: 1. Chronic combined systolic and diastolic CHF:  - Echo [id number] EF 30-35%, RV not well visualized.   - 10/2016 EF 20-25% NICM, likely tachymediated although noncompliance may play a role.  - Echo 11/2017: EF 20-25%, moderate LVH, diffuse HK, mild MR, severe dilation of RA and LA - Echo 12/19 EF 25-30% - NYHA II, not physically active and limited by RLE weakness from previous muscle repair. Volume status difficult due to body habitus, but he does not appear volume overloaded.  - Continue torsemide 60 mg daily. - Continue carvedilol 6.25 mg bid. - Continue digoxin 0.25 mcg daily. - Continue hydralazine 50 mg tid + Imdur 60 mg daily. - Continue spiro 12.5 mg daily. - Continue losartan 25 mg daily (refuses Entresto due to severe cough). - No SGLT2i with large panus, super morbid obesity. - Update echo. - Previously evaluated by EP for ICD however he needed lose weight for consideration.  - Labs today.  2. PAF - He is symptomatic when in atrial fibrillation.  - ECG today NSR. - Needs regular eye exams while he is on amiodarone. - Decrease amiodarone to 200 mg daily. Plan  to stop at next visit if remains in [place]. - Has been on Coumadin but not compliant with INR checks. - Discussed with Ander Purpura, PharmD and Dr. Aundra Dubin, will stop Coumadin. - Check INR, if INR< 3, start Xarelto  20 mg daily (hoping for better compliance with daily dosing vs bid dosing with Eliquis) - Check amio labs.    3. CKD stage IIIa - Baseline 1.3-1.5.  - Labs today.  4. HTN - Elevated today, has not taken meds in 1 day. - Refill and restart all HF meds. - Check BP at home. - Continue CPAP  5. CAD - Non obstructive on cath 8/18. - No chest pain. - Continue statin. - Check lipids in 6-8 weeks.   6. Obesity: - Body mass index is 64.35 kg/m. - We discussed bariatric surgery, he says his new insurance will not cover this. - Reinforced portion control and exercise. - Consider referral to pharmacy for semaglutide [facility], they may be able to offer virtual visits so he does not have to drive back.    7. OSA - Continue CPAP nightly.    8. Erectile dysfunction - Continue sildenafil prn.  - He understands he should not take Imdur if he plan to take sildenafil.    9. Non-compliance - Unfortunately he does not live locally and we cannot offer paramedicine support. - Will have scale sent to him that can accommodate >500 lbs. - Given a pillbox today. - Will have insurance 08/2021, meds thru HF fund until then.  - Needs PCP in [place].  Will ask Dr. Haroldine Laws for recommendations for cardiologist/heart failure team in [place] for which patient could establish care. I discussed we are happy to continue to care for him here, but traveling to and from appts may be challenging.  Follow up in 4 weeks with APP for further medication titration.   Frankey Poot, [place]  08/09/2021 11:20 AM  Advanced Heart Failure [place] [facility] [address] [phone] (office) [phone]  (fax)  Greater than 50% of the (total minutes 35) visit spent in counseling/coordination of care regarding (medication management, plan of care, dietary and lifestyle changes and change in oral anti-coagulation regimen).

## 2021-08-08 ENCOUNTER — Telehealth (HOSPITAL_COMMUNITY): Payer: Self-pay

## 2021-08-08 NOTE — Telephone Encounter (Signed)
Called and left patient a detailed message to confirm/remind patient of their appointment at the [facility] on 08/09/21.   Patient was also reminded on vm to bring all medications and/or complete list and to give our office a call back if needed.

## 2021-08-09 ENCOUNTER — Other Ambulatory Visit (HOSPITAL_COMMUNITY): Payer: Self-pay

## 2021-08-09 ENCOUNTER — Telehealth (HOSPITAL_COMMUNITY): Payer: Self-pay | Admitting: Surgery

## 2021-08-09 ENCOUNTER — Other Ambulatory Visit: Payer: Self-pay

## 2021-08-09 ENCOUNTER — Encounter (HOSPITAL_COMMUNITY): Payer: Self-pay

## 2021-08-09 ENCOUNTER — Ambulatory Visit (HOSPITAL_COMMUNITY)
Admission: RE | Admit: 2021-08-09 | Discharge: 2021-08-09 | Disposition: A | Discharge status: Home / Self Care | Payer: Self-pay | Source: Ambulatory Visit | Attending: Family Medicine | Admitting: Family Medicine

## 2021-08-09 VITALS — BP 174/96 | HR 94 | Ht 74.0 in | Wt >= 6400 oz

## 2021-08-09 DIAGNOSIS — I480 Paroxysmal atrial fibrillation: Secondary | ICD-10-CM

## 2021-08-09 DIAGNOSIS — I130 Hypertensive heart and chronic kidney disease with heart failure and stage 1 through stage 4 chronic kidney disease, or unspecified chronic kidney disease: Secondary | ICD-10-CM | POA: Insufficient documentation

## 2021-08-09 DIAGNOSIS — N529 Male erectile dysfunction, unspecified: Secondary | ICD-10-CM

## 2021-08-09 DIAGNOSIS — Z91128 Patient's intentional underdosing of medication regimen for other reason: Secondary | ICD-10-CM | POA: Insufficient documentation

## 2021-08-09 DIAGNOSIS — Z9989 Dependence on other enabling machines and devices: Secondary | ICD-10-CM

## 2021-08-09 DIAGNOSIS — Z79899 Other long term (current) drug therapy: Secondary | ICD-10-CM | POA: Insufficient documentation

## 2021-08-09 DIAGNOSIS — N1831 Chronic kidney disease, stage 3a: Secondary | ICD-10-CM

## 2021-08-09 DIAGNOSIS — G4733 Obstructive sleep apnea (adult) (pediatric): Secondary | ICD-10-CM

## 2021-08-09 DIAGNOSIS — I428 Other cardiomyopathies: Secondary | ICD-10-CM | POA: Insufficient documentation

## 2021-08-09 DIAGNOSIS — I5022 Chronic systolic (congestive) heart failure: Secondary | ICD-10-CM

## 2021-08-09 DIAGNOSIS — I10 Essential (primary) hypertension: Secondary | ICD-10-CM

## 2021-08-09 DIAGNOSIS — I2510 Atherosclerotic heart disease of native coronary artery without angina pectoris: Secondary | ICD-10-CM

## 2021-08-09 DIAGNOSIS — Z7901 Long term (current) use of anticoagulants: Secondary | ICD-10-CM | POA: Insufficient documentation

## 2021-08-09 DIAGNOSIS — I5042 Chronic combined systolic (congestive) and diastolic (congestive) heart failure: Secondary | ICD-10-CM | POA: Insufficient documentation

## 2021-08-09 DIAGNOSIS — Z6841 Body Mass Index (BMI) 40.0 and over, adult: Secondary | ICD-10-CM | POA: Insufficient documentation

## 2021-08-09 DIAGNOSIS — Z91199 Patient's noncompliance with other medical treatment and regimen due to unspecified reason: Secondary | ICD-10-CM

## 2021-08-09 LAB — COMPREHENSIVE METABOLIC PANEL
ALT: 18 U/L (ref 0–44)
AST: 22 U/L (ref 15–41)
Albumin: 4 g/dL (ref 3.5–5.0)
Alkaline Phosphatase: 50 U/L (ref 38–126)
Anion gap: 10 (ref 5–15)
BUN: 13 mg/dL (ref 6–20)
CO2: 31 mmol/L (ref 22–32)
Calcium: 9 mg/dL (ref 8.9–10.3)
Chloride: 100 mmol/L (ref 98–111)
Creatinine, Ser: 1.58 mg/dL — ABNORMAL HIGH (ref 0.61–1.24)
GFR, Estimated: 56 mL/min — ABNORMAL LOW (ref >60)
Glucose, Bld: 101 mg/dL — ABNORMAL HIGH (ref 70–99)
Potassium: 3.8 mmol/L (ref 3.5–5.1)
Sodium: 141 mmol/L (ref 135–145)
Total Bilirubin: 2.9 mg/dL — ABNORMAL HIGH (ref 0.3–1.2)
Total Protein: 7.6 g/dL (ref 6.5–8.1)

## 2021-08-09 LAB — CBC
HCT: 49.6 % (ref 39.0–52.0)
Hemoglobin: 15.9 g/dL (ref 13.0–17.0)
MCH: 30 pg (ref 26.0–34.0)
MCHC: 32.1 g/dL (ref 30.0–36.0)
MCV: 93.6 fL (ref 80.0–100.0)
Platelets: 193 10*3/uL (ref 150–400)
RBC: 5.3 MIL/uL (ref 4.22–5.81)
RDW: 12.4 % (ref 11.5–15.5)
WBC: 6.7 10*3/uL (ref 4.0–10.5)
nRBC: 0 % (ref 0.0–0.2)

## 2021-08-09 LAB — HEMOGLOBIN A1C
Hgb A1c MFr Bld: 4.9 % (ref 4.8–5.6)
Mean Plasma Glucose: 93.93 mg/dL

## 2021-08-09 LAB — PROTIME-INR
INR: 1.4 — ABNORMAL HIGH (ref 0.8–1.2)
Prothrombin Time: 16.9 seconds — ABNORMAL HIGH (ref 11.4–15.2)

## 2021-08-09 LAB — TSH: TSH: 0.617 u[IU]/mL (ref 0.350–4.500)

## 2021-08-09 LAB — DIGOXIN LEVEL: Digoxin Level: 0.8 ng/mL (ref 0.8–2.0)

## 2021-08-09 MED ORDER — LOSARTAN POTASSIUM 25 MG PO TABS
25.0000 mg | ORAL_TABLET | Freq: Every day | ORAL | 2 refills | Status: DC
  Filled 2021-08-09: qty 30, 30d supply, fill #0
  Filled 2021-09-10: qty 30, 30d supply, fill #1
  Filled 2021-10-07: qty 30, 30d supply, fill #2

## 2021-08-09 MED ORDER — TORSEMIDE 20 MG PO TABS
ORAL_TABLET | ORAL | 2 refills | Status: DC
  Filled 2021-08-09: qty 90, 30d supply, fill #0
  Filled 2021-09-07: qty 90, 30d supply, fill #1
  Filled 2021-10-07: qty 90, 30d supply, fill #2

## 2021-08-09 MED ORDER — ATORVASTATIN CALCIUM 40 MG PO TABS
40.0000 mg | ORAL_TABLET | Freq: Every day | ORAL | 2 refills | Status: AC
  Filled 2021-08-09: qty 30, 30d supply, fill #0
  Filled 2021-09-07: qty 30, 30d supply, fill #1
  Filled 2021-10-07: qty 30, 30d supply, fill #2

## 2021-08-09 MED ORDER — AMIODARONE HCL 200 MG PO TABS
200.0000 mg | ORAL_TABLET | Freq: Two times a day (BID) | ORAL | 2 refills | Status: DC
  Filled 2021-08-09: qty 60, 30d supply, fill #0
  Filled 2021-09-07: qty 60, 30d supply, fill #1
  Filled 2021-10-07: qty 60, 30d supply, fill #2

## 2021-08-09 MED ORDER — RIVAROXABAN 20 MG PO TABS
20.0000 mg | ORAL_TABLET | Freq: Every day | ORAL | 2 refills | Status: DC
  Filled 2021-08-09: qty 30, 30d supply, fill #0
  Filled 2021-09-07: qty 30, 30d supply, fill #1

## 2021-08-09 MED ORDER — CARVEDILOL 6.25 MG PO TABS
6.2500 mg | ORAL_TABLET | Freq: Two times a day (BID) | ORAL | 2 refills | Status: DC
  Filled 2021-08-09: qty 60, 30d supply, fill #0
  Filled 2021-09-07: qty 60, 30d supply, fill #1
  Filled 2021-10-07: qty 60, 30d supply, fill #2

## 2021-08-09 MED ORDER — ISOSORBIDE MONONITRATE ER 60 MG PO TB24
60.0000 mg | ORAL_TABLET | Freq: Every day | ORAL | 2 refills | Status: AC
  Filled 2021-08-09: qty 30, 30d supply, fill #0
  Filled 2021-09-07: qty 30, 30d supply, fill #1
  Filled 2021-10-07: qty 30, 30d supply, fill #2

## 2021-08-09 MED ORDER — HYDRALAZINE HCL 50 MG PO TABS
50.0000 mg | ORAL_TABLET | Freq: Three times a day (TID) | ORAL | 2 refills | Status: DC
  Filled 2021-08-09: qty 90, 30d supply, fill #0
  Filled 2021-09-07: qty 90, 30d supply, fill #1
  Filled 2021-10-07: qty 90, 30d supply, fill #2

## 2021-08-09 MED ORDER — DIGOXIN 250 MCG PO TABS
250.0000 ug | ORAL_TABLET | Freq: Every day | ORAL | 2 refills | Status: DC
  Filled 2021-08-09 – 2021-09-07 (×2): qty 30, 30d supply, fill #0
  Filled 2021-10-07: qty 30, 30d supply, fill #1

## 2021-08-09 MED ORDER — SPIRONOLACTONE 25 MG PO TABS
12.5000 mg | ORAL_TABLET | Freq: Every day | ORAL | 2 refills | Status: DC
  Filled 2021-08-09: qty 15, 30d supply, fill #0
  Filled 2021-09-07: qty 15, 30d supply, fill #1
  Filled 2021-10-07: qty 15, 30d supply, fill #2

## 2021-08-09 NOTE — Telephone Encounter (Signed)
Per Prince Rome NP I have ordered and will ship Mark Baker a scale that has weight capacity to 550lbs.  I have called him to verify address and to let him know to expect the scale in the mail.

## 2021-08-09 NOTE — Patient Instructions (Signed)
STOP Coumadin -we will call with exact start date  instructions for xarelto  START Xarelto 20 mg, one tab nightly at dinner  Labs today We will only contact you if something comes back abnormal or we need to make some changes. Otherwise no news is good news!   Your physician has requested that you have an echocardiogram. Echocardiography is a painless test that uses sound waves to create images of your heart. It provides your doctor with information about the size and shape of your heart and how well your hearts chambers and valves are working. This procedure takes approximately one hour. There are no restrictions for this procedure.  Your physician recommends that you schedule a follow-up appointment in: 4 weeks  in the [facility]     Do the following things EVERYDAY: Weigh yourself in the morning before breakfast. Write it down and keep it in a log. Take your medicines as prescribed Eat low salt foods--Limit salt (sodium) to 2000 mg per day.  Stay as active as you can everyday Limit all fluids for the day to less than 2 liters  At the [facility], you and your health needs are our priority. As part of our continuing mission to provide you with exceptional heart care, we have created designated Provider Care Teams. These Care Teams include your primary Cardiologist (physician) and Advanced Practice Providers (APPs- Physician Assistants and Nurse Practitioners) who all work together to provide you with the care you need, when you need it.   You may see any of the following providers on your designated Care Team at your next follow up: Dr [name] Dr Carron Curie, NP Robbie Lis, [place] [facility] [place], [place] Karle Plumber, PharmD   Please be sure to bring in all your medications bottles to every appointment.

## 2021-08-09 NOTE — Telephone Encounter (Signed)
I called patient and he called me back immediately.  Lab results and recommendations per Prince Rome NP were reviewed.  He is aware and agreeable.

## 2021-08-10 ENCOUNTER — Other Ambulatory Visit (HOSPITAL_COMMUNITY): Payer: Self-pay

## 2021-08-20 ENCOUNTER — Other Ambulatory Visit (HOSPITAL_COMMUNITY): Payer: Self-pay

## 2021-08-20 ENCOUNTER — Encounter (HOSPITAL_COMMUNITY): Payer: Self-pay

## 2021-08-24 NOTE — Telephone Encounter (Signed)
Referral faxed

## 2021-09-07 ENCOUNTER — Other Ambulatory Visit (HOSPITAL_COMMUNITY): Payer: Self-pay

## 2021-09-10 ENCOUNTER — Other Ambulatory Visit (HOSPITAL_COMMUNITY): Payer: Self-pay

## 2021-09-11 ENCOUNTER — Other Ambulatory Visit (HOSPITAL_COMMUNITY): Payer: Self-pay

## 2021-09-13 ENCOUNTER — Other Ambulatory Visit (HOSPITAL_COMMUNITY): Payer: Self-pay

## 2021-09-14 ENCOUNTER — Encounter (HOSPITAL_COMMUNITY): Payer: Self-pay

## 2021-10-04 ENCOUNTER — Other Ambulatory Visit (HOSPITAL_COMMUNITY): Payer: Self-pay

## 2021-10-08 ENCOUNTER — Other Ambulatory Visit (HOSPITAL_COMMUNITY): Payer: Self-pay

## 2021-11-28 ENCOUNTER — Other Ambulatory Visit: Payer: Self-pay | Admitting: Internal Medicine

## 2021-12-02 ENCOUNTER — Emergency Department (HOSPITAL_BASED_OUTPATIENT_CLINIC_OR_DEPARTMENT_OTHER): Payer: Self-pay

## 2021-12-02 ENCOUNTER — Emergency Department (HOSPITAL_COMMUNITY)
Admission: EM | Admit: 2021-12-02 | Discharge: 2021-12-02 | Disposition: A | Discharge status: Home / Self Care | Payer: Self-pay | Attending: Emergency Medicine | Admitting: Emergency Medicine

## 2021-12-02 ENCOUNTER — Emergency Department (HOSPITAL_COMMUNITY): Payer: Self-pay

## 2021-12-02 ENCOUNTER — Encounter (HOSPITAL_COMMUNITY): Payer: Self-pay | Admitting: Emergency Medicine

## 2021-12-02 ENCOUNTER — Other Ambulatory Visit: Payer: Self-pay

## 2021-12-02 DIAGNOSIS — M79604 Pain in right leg: Secondary | ICD-10-CM

## 2021-12-02 DIAGNOSIS — I110 Hypertensive heart disease with heart failure: Secondary | ICD-10-CM | POA: Insufficient documentation

## 2021-12-02 DIAGNOSIS — I4891 Unspecified atrial fibrillation: Secondary | ICD-10-CM | POA: Insufficient documentation

## 2021-12-02 DIAGNOSIS — I82431 Acute embolism and thrombosis of right popliteal vein: Secondary | ICD-10-CM

## 2021-12-02 DIAGNOSIS — I509 Heart failure, unspecified: Secondary | ICD-10-CM | POA: Insufficient documentation

## 2021-12-02 DIAGNOSIS — M7989 Other specified soft tissue disorders: Secondary | ICD-10-CM

## 2021-12-02 DIAGNOSIS — R0602 Shortness of breath: Secondary | ICD-10-CM

## 2021-12-02 DIAGNOSIS — Z7901 Long term (current) use of anticoagulants: Secondary | ICD-10-CM | POA: Insufficient documentation

## 2021-12-02 DIAGNOSIS — Z79899 Other long term (current) drug therapy: Secondary | ICD-10-CM | POA: Insufficient documentation

## 2021-12-02 MED ORDER — APIXABAN (ELIQUIS) VTE STARTER PACK (10MG AND 5MG)
ORAL_TABLET | ORAL | 0 refills | Status: DC
Start: 2021-12-02 — End: 2021-12-02

## 2021-12-02 MED ORDER — APIXABAN 5 MG PO TABS
10.0000 mg | ORAL_TABLET | Freq: Once | ORAL | Status: AC
  Administered 2021-12-02: 10 mg via ORAL
  Filled 2021-12-02: qty 2

## 2021-12-02 MED ORDER — OXYCODONE-ACETAMINOPHEN 5-325 MG PO TABS
1.0000 | ORAL_TABLET | Freq: Once | ORAL | Status: AC
  Administered 2021-12-02: 1 via ORAL
  Filled 2021-12-02: qty 1

## 2021-12-02 MED ORDER — APIXABAN (ELIQUIS) VTE STARTER PACK (10MG AND 5MG): ORAL_TABLET | ORAL | 0 refills | Status: DC

## 2021-12-02 NOTE — Discharge Instructions (Addendum)
I have changed your Xaralto therapy to Eliquis. You need to pick this up from the pharmacy TODAY. Please start taking 5 mg twice a day starting with first dose tonight. Take twice a day for 7 days. On day 8, you will need to start taking one a day. You also will need to follow up with your primary care provider. Please call them tomorrow for an emergency room follow visit. They may need to refer you to hematology for further workup. ? ?Information on my medicine - ELIQUIS? (apixaban) ? ?This medication education was reviewed with me or my healthcare representative as part of my discharge preparation.  ? ?Why was Eliquis? prescribed for you? ?Eliquis? was prescribed to treat blood clots that may have been found in the veins of your legs (deep vein thrombosis) or in your lungs (pulmonary embolism) and to reduce the risk of them occurring again. ? ?What do You need to know about Eliquis? ? ?The starting dose is 10 mg (two 5 mg tablets) taken TWICE daily for the FIRST SEVEN (7) DAYS, then on (enter date)  4/30 with the evening dose  the dose is reduced to ONE 5 mg tablet taken TWICE daily.  Eliquis? may be taken with or without food.  ? ?Try to take the dose about the same time in the morning and in the evening. If you have difficulty swallowing the tablet whole please discuss with your pharmacist how to take the medication safely. ? ?Take Eliquis? exactly as prescribed and DO NOT stop taking Eliquis? without talking to the doctor who prescribed the medication.  Stopping may increase your risk of developing a new blood clot.  Refill your prescription before you run out. ? ?After discharge, you should have regular check-up appointments with your healthcare provider that is prescribing your Eliquis?. ?   ?What do you do if you miss a dose? ?If a dose of ELIQUIS? is not taken at the scheduled time, take it as soon as possible on the same day and twice-daily administration should be resumed. The dose should not be doubled  to make up for a missed dose. ? ?Important Safety Information ?A possible side effect of Eliquis? is bleeding. You should call your healthcare provider right away if you experience any of the following: ?Bleeding from an injury or your nose that does not stop. ?Unusual colored urine (red or dark brown) or unusual colored stools (red or black). ?Unusual bruising for unknown reasons. ?A serious fall or if you hit your head (even if there is no bleeding). ? ?Some medicines may interact with Eliquis? and might increase your risk of bleeding or clotting while on Eliquis?Marland Kitchen To help avoid this, consult your healthcare provider or pharmacist prior to using any new prescription or non-prescription medications, including herbals, vitamins, non-steroidal anti-inflammatory drugs (NSAIDs) and supplements. ? ?This website has more information on Eliquis? (apixaban): http://www.eliquis.com/eliquis/home ? ?

## 2021-12-02 NOTE — ED Provider Notes (Signed)
?[facility] ?Provider Note ? ? ?CSN: [id number] ?Arrival date & time: 12/02/21  1100 ? ?  ? ?History ?PMH: Hypertension, OSA, CHF, history of quadriceps tendon rupture status postrepair in 2019 on the right knee, A fib on Xaralto  ? ?Chief Complaint  ?Patient presents with  ? Leg Pain  ? ? ?Mark Baker is a 41 y.o. male.   ?Presents the emergency department with a chief complaint of right leg pain.  He says that on Sunday he started noticing some pain behind his right knee while he was standing.  He says that this radiates to the left side of his knee and radiates down his calf.  He denies any recent leg swelling is worse than his normal or redness.  Denies recent travel.  He is on Xarelto and has been compliant with this.  He has also been compliant with his Lasix.  He denies any shortness of breath or chest pain. ? ? ?Leg Pain ? ?  ? ?Home Medications ?Prior to Admission medications   ?Medication Sig Start Date End Date Taking? Authorizing Provider  ?acetaminophen (TYLENOL) 325 MG tablet Take 1-2 tablets (325-650 mg total) by mouth every 4 (four) hours as needed for mild pain. 08/04/18   Love, Ivan Anchors, PA-C  ?amiodarone (PACERONE) 200 MG tablet Take 1 tablet (200 mg total) by mouth 2 (two) times daily. 08/09/21   Rafael Bihari, FNP  ?APIXABAN (ELIQUIS) VTE STARTER PACK (10MG  AND 5MG ) Take as directed on package: start with two-5mg  tablets twice daily for 7 days. On day 8, switch to one-5mg  tablet twice daily. 12/02/21   Maeghan Canny, Adora Fridge, PA-C  ?atorvastatin (LIPITOR) 40 MG tablet Take 1 tablet (40 mg total) by mouth daily. 08/09/21   Rafael Bihari, FNP  ?carvedilol (COREG) 6.25 MG tablet Take 1 tablet (6.25 mg total) by mouth 2 (two) times daily. 08/09/21   Rafael Bihari, FNP  ?digoxin (LANOXIN) 0.25 MG tablet Take 1 tablet (250 mcg total) by mouth daily. 08/09/21   Rafael Bihari, FNP  ?hydrALAZINE (APRESOLINE) 50 MG tablet Take 1 tablet (50 mg total) by  mouth 3 (three) times daily. Needs appt 08/09/21   Rafael Bihari, FNP  ?isosorbide mononitrate (IMDUR) 60 MG 24 hr tablet Take 1 tablet (60 mg total) by mouth daily. 08/09/21   Rafael Bihari, FNP  ?losartan (COZAAR) 25 MG tablet Take 1 tablet (25 mg total) by mouth daily. 08/09/21   Rafael Bihari, FNP  ?sildenafil (VIAGRA) 50 MG tablet Take 1 tablet (50 mg total) by mouth daily as needed for erectile dysfunction. Please come to the appointment 10/02/20. We cannot do further refill if appointment not kept. 08/24/20   Bensimhon, Shaune Pascal, MD  ?spironolactone (ALDACTONE) 25 MG tablet Take 1/2 tablet (12.5 mg total) by mouth daily. 08/09/21   Rafael Bihari, FNP  ?torsemide (DEMADEX) 20 MG tablet Take 2 tablets (40 mg total) by mouth in the morning AND 1 tablet (20 mg total) every evening. 08/09/21   Rafael Bihari, FNP  ?Potassium Chloride ER 20 MEQ TBCR Take 20 mEq by mouth daily as needed (cramping). MUST KEEP UPCOMING APPT FOR ADDITIONAL REFILLS 06/23/20 12/19/20  Bensimhon, Shaune Pascal, MD  ?   ? ?Allergies    ?Aspirin and Entresto [sacubitril-valsartan]   ? ?Review of Systems   ?Review of Systems  ?Musculoskeletal:  Positive for arthralgias.  ?All other systems reviewed and are negative. ? ?Physical Exam ?Updated Vital Signs ?BP  126/89 (BP Location: Left Arm)   Pulse 80   Temp 98.3 ?F (36.8 ?C) (Oral)   Resp 17   SpO2 97%  ?Physical Exam ?Vitals and nursing note reviewed.  ?Constitutional:   ?   General: He is not in acute distress. ?   Appearance: Normal appearance. He is well-developed. He is not ill-appearing, toxic-appearing or diaphoretic.  ?HENT:  ?   Head: Normocephalic and atraumatic.  ?   Nose: No nasal deformity.  ?   Mouth/Throat:  ?   Lips: Pink. No lesions.  ?Eyes:  ?   General: Gaze aligned appropriately. No scleral icterus.    ?   Right eye: No discharge.     ?   Left eye: No discharge.  ?   Conjunctiva/sclera: Conjunctivae normal.  ?   Right eye: Right conjunctiva is not  injected. No exudate or hemorrhage. ?   Left eye: Left conjunctiva is not injected. No exudate or hemorrhage. ?Pulmonary:  ?   Effort: Pulmonary effort is normal. No respiratory distress.  ?Musculoskeletal:  ?   Comments: No obvious swelling or deformity of right lower extremity or knee.  He does have moderate tenderness behind the popliteal space and into the lateral knee and anterior tibial plateau.  Flexion/Extension is limited due to prior history of patellar tendon rupture and repair, but patient states this is unchanged from baseline.  Pedal pulses 2+.  Sensation intact distally.  ?Skin: ?   General: Skin is warm and dry.  ?Neurological:  ?   Mental Status: He is alert and oriented to person, place, and time.  ?Psychiatric:     ?   Mood and Affect: Mood normal.     ?   Speech: Speech normal.     ?   Behavior: Behavior normal. Behavior is cooperative.  ? ? ?ED Results / Procedures / Treatments   ?Labs ?(all labs ordered are listed, but only abnormal results are displayed) ?Labs Reviewed - No data to display ? ?EKG ?None ? ?Radiology ?DG Knee Complete 4 Views Right ? ?Result Date: 12/02/2021 ?CLINICAL DATA:  41 year old male with history of right-sided knee pain. EXAM: RIGHT KNEE - COMPLETE 4+ VIEW COMPARISON:  No priors. FINDINGS: Extensive soft tissue swelling is noted in the suprapatellar region. Suprapatellar effusion. No acute displaced fracture, subluxation or dislocation. There is some joint space narrowing, subchondral sclerosis and mild osteophyte formation in the knee joint, in a tricompartmental distribution. IMPRESSION: 1. No acute osseous abnormality of the right knee. 2. Suprapatellar joint effusion. 3. In addition, there is extensive soft tissue swelling in the inferior aspect of the quadriceps region. Correlation with physical examination is recommended. Differential considerations are broad, including soft tissue tumor, abscess or hematoma. 4. Mild tricompartmental osteoarthritis. Electronically  Signed   By: [name] M.D.   On: 12/02/2021 12:32  ? ?VAS [place] LOWER EXTREMITY VENOUS (DVT) (7a-7p) ? ?Result Date: 12/02/2021 ? Lower Venous DVT Study Patient Name:  Damarcus Maiorino  Date of Exam:   12/02/2021 Medical Rec #: [id number]    Accession #:    [id number] Date of Birth: February 15, 1981   Patient Gender: M Patient Age:   35 years Exam Location:  [facility] Procedure:      VAS [place] LOWER EXTREMITY VENOUS (DVT) Referring Phys: Tambria Pfannenstiel --------------------------------------------------------------------------------  Indications: Pain, Swelling, and SOB.  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 Anticoagulation: Xarelto. Limitations: Body habitus (BMI 64.2). Comparison Study:  No prior study on file Performing Technologist: Sharion Dove RVS  Examination Guidelines: A complete evaluation includes B-mode imaging, spectral Doppler, color Doppler, and power Doppler as needed of all accessible portions of each vessel. Bilateral testing is considered an integral part of a complete examination. Limited examinations for reoccurring indications may be performed as noted. The reflux portion of the exam is performed with the patient in reverse Trendelenburg.  +---------+---------------+---------+-----------+----------+-------------------+ RIGHT    CompressibilityPhasicitySpontaneityPropertiesThrombus Aging      +---------+---------------+---------+-----------+----------+-------------------+ CFV      Full           Yes      Yes                                      +---------+---------------+---------+-----------+----------+-------------------+ SFJ      Full                                                             +---------+---------------+---------+-----------+----------+-------------------+ FV Prox  Full           Yes      Yes                                       +---------+---------------+---------+-----------+----------+-------------------+ FV Mid                  Yes      Yes                  patent by color and                                                       Doppler             +---------+---------------+---------+-----------+-

## 2021-12-02 NOTE — Progress Notes (Signed)
VASCULAR LAB ? ? ? ?Right lower extremity venous duplex has been performed. ? ?See CV proc for preliminary results. ? ?Messaged results to Dr. Lynelle Doctor and Theophilus Kinds, PA-C via secure chat. ? ?Leticia Coletta, RVT ?12/02/2021, 12:27 PM ? ?

## 2021-12-02 NOTE — ED Triage Notes (Signed)
Pt from home via [facility] with reports of right leg pain x 1 week. Pt reports pain behind the knee radiating down to his calf. Pt denies injury to the area. Pt on 4L O2 upon arrival. Per EMS pts O2 90% on arrival. Pt does not wear O2.  ?

## 2021-12-03 ENCOUNTER — Telehealth (HOSPITAL_COMMUNITY): Payer: Self-pay | Admitting: Emergency Medicine

## 2021-12-03 NOTE — Telephone Encounter (Signed)
Spoke with Dr. 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 He was switched to DVT dosing of Eliquis and discharged home. Plan to have him follow up with hematology for hypercoagulable workup. Placed ambulatory referral order.  ?

## 2023-05-02 ENCOUNTER — Inpatient Hospital Stay (HOSPITAL_BASED_OUTPATIENT_CLINIC_OR_DEPARTMENT_OTHER)
Admission: EM | Admit: 2023-05-02 | Discharge: 2023-05-07 | DRG: 291 | Disposition: A | Discharge status: Home / Self Care | Payer: Commercial Managed Care - PPO | Attending: Cardiology | Admitting: Cardiology

## 2023-05-02 ENCOUNTER — Emergency Department (HOSPITAL_BASED_OUTPATIENT_CLINIC_OR_DEPARTMENT_OTHER): Payer: Commercial Managed Care - PPO

## 2023-05-02 ENCOUNTER — Encounter (HOSPITAL_BASED_OUTPATIENT_CLINIC_OR_DEPARTMENT_OTHER): Payer: Self-pay

## 2023-05-02 DIAGNOSIS — Z79899 Other long term (current) drug therapy: Secondary | ICD-10-CM | POA: Diagnosis not present

## 2023-05-02 DIAGNOSIS — I447 Left bundle-branch block, unspecified: Secondary | ICD-10-CM | POA: Diagnosis present

## 2023-05-02 DIAGNOSIS — Z91148 Patient's other noncompliance with medication regimen for other reason: Secondary | ICD-10-CM | POA: Diagnosis not present

## 2023-05-02 DIAGNOSIS — I2489 Other forms of acute ischemic heart disease: Secondary | ICD-10-CM | POA: Diagnosis present

## 2023-05-02 DIAGNOSIS — E876 Hypokalemia: Secondary | ICD-10-CM | POA: Diagnosis not present

## 2023-05-02 DIAGNOSIS — Z888 Allergy status to other drugs, medicaments and biological substances status: Secondary | ICD-10-CM

## 2023-05-02 DIAGNOSIS — R0601 Orthopnea: Secondary | ICD-10-CM | POA: Diagnosis present

## 2023-05-02 DIAGNOSIS — Z7984 Long term (current) use of oral hypoglycemic drugs: Secondary | ICD-10-CM

## 2023-05-02 DIAGNOSIS — I272 Pulmonary hypertension, unspecified: Secondary | ICD-10-CM | POA: Diagnosis present

## 2023-05-02 DIAGNOSIS — I509 Heart failure, unspecified: Secondary | ICD-10-CM

## 2023-05-02 DIAGNOSIS — G43909 Migraine, unspecified, not intractable, without status migrainosus: Secondary | ICD-10-CM | POA: Diagnosis present

## 2023-05-02 DIAGNOSIS — Z1152 Encounter for screening for COVID-19: Secondary | ICD-10-CM

## 2023-05-02 DIAGNOSIS — I428 Other cardiomyopathies: Secondary | ICD-10-CM | POA: Diagnosis present

## 2023-05-02 DIAGNOSIS — N179 Acute kidney failure, unspecified: Secondary | ICD-10-CM | POA: Diagnosis present

## 2023-05-02 DIAGNOSIS — Z7901 Long term (current) use of anticoagulants: Secondary | ICD-10-CM | POA: Diagnosis not present

## 2023-05-02 DIAGNOSIS — I10 Essential (primary) hypertension: Secondary | ICD-10-CM | POA: Diagnosis present

## 2023-05-02 DIAGNOSIS — I5021 Acute systolic (congestive) heart failure: Secondary | ICD-10-CM | POA: Diagnosis present

## 2023-05-02 DIAGNOSIS — I5023 Acute on chronic systolic (congestive) heart failure: Secondary | ICD-10-CM

## 2023-05-02 DIAGNOSIS — Z886 Allergy status to analgesic agent status: Secondary | ICD-10-CM

## 2023-05-02 DIAGNOSIS — I480 Paroxysmal atrial fibrillation: Secondary | ICD-10-CM | POA: Diagnosis present

## 2023-05-02 DIAGNOSIS — Z6841 Body Mass Index (BMI) 40.0 and over, adult: Secondary | ICD-10-CM

## 2023-05-02 DIAGNOSIS — N1832 Chronic kidney disease, stage 3b: Secondary | ICD-10-CM | POA: Diagnosis present

## 2023-05-02 DIAGNOSIS — I5043 Acute on chronic combined systolic (congestive) and diastolic (congestive) heart failure: Secondary | ICD-10-CM | POA: Diagnosis present

## 2023-05-02 DIAGNOSIS — I420 Dilated cardiomyopathy: Secondary | ICD-10-CM | POA: Diagnosis present

## 2023-05-02 DIAGNOSIS — G4733 Obstructive sleep apnea (adult) (pediatric): Secondary | ICD-10-CM | POA: Diagnosis present

## 2023-05-02 DIAGNOSIS — I130 Hypertensive heart and chronic kidney disease with heart failure and stage 1 through stage 4 chronic kidney disease, or unspecified chronic kidney disease: Principal | ICD-10-CM | POA: Diagnosis present

## 2023-05-02 DIAGNOSIS — Z8249 Family history of ischemic heart disease and other diseases of the circulatory system: Secondary | ICD-10-CM

## 2023-05-02 DIAGNOSIS — G4730 Sleep apnea, unspecified: Secondary | ICD-10-CM | POA: Diagnosis present

## 2023-05-02 DIAGNOSIS — I5041 Acute combined systolic (congestive) and diastolic (congestive) heart failure: Secondary | ICD-10-CM | POA: Diagnosis present

## 2023-05-02 DIAGNOSIS — N189 Chronic kidney disease, unspecified: Secondary | ICD-10-CM

## 2023-05-02 LAB — COMPREHENSIVE METABOLIC PANEL
ALT: 32 U/L (ref 0–44)
AST: 25 U/L (ref 15–41)
Albumin: 3.6 g/dL (ref 3.5–5.0)
Alkaline Phosphatase: 46 U/L (ref 38–126)
Anion gap: 13 (ref 5–15)
BUN: 32 mg/dL — ABNORMAL HIGH (ref 6–20)
CO2: 28 mmol/L (ref 22–32)
Calcium: 8.7 mg/dL — ABNORMAL LOW (ref 8.9–10.3)
Chloride: 96 mmol/L — ABNORMAL LOW (ref 98–111)
Creatinine, Ser: 1.88 mg/dL — ABNORMAL HIGH (ref 0.61–1.24)
GFR, Estimated: 45 mL/min — ABNORMAL LOW (ref >60)
Glucose, Bld: 125 mg/dL — ABNORMAL HIGH (ref 70–99)
Potassium: 3.4 mmol/L — ABNORMAL LOW (ref 3.5–5.1)
Sodium: 137 mmol/L (ref 135–145)
Total Bilirubin: 2.9 mg/dL — ABNORMAL HIGH (ref 0.3–1.2)
Total Protein: 7 g/dL (ref 6.5–8.1)

## 2023-05-02 LAB — PROTIME-INR
INR: 2.9 — ABNORMAL HIGH (ref 0.8–1.2)
Prothrombin Time: 30.7 seconds — ABNORMAL HIGH (ref 11.4–15.2)

## 2023-05-02 LAB — CBC
HCT: 44.2 % (ref 39.0–52.0)
Hemoglobin: 13.4 g/dL (ref 13.0–17.0)
MCH: 28 pg (ref 26.0–34.0)
MCHC: 30.3 g/dL (ref 30.0–36.0)
MCV: 92.5 fL (ref 80.0–100.0)
Platelets: 256 10*3/uL (ref 150–400)
RBC: 4.78 MIL/uL (ref 4.22–5.81)
RDW: 13.4 % (ref 11.5–15.5)
WBC: 7.5 10*3/uL (ref 4.0–10.5)
nRBC: 0.3 % — ABNORMAL HIGH (ref 0.0–0.2)

## 2023-05-02 LAB — BRAIN NATRIURETIC PEPTIDE: B Natriuretic Peptide: 1274.3 pg/mL — ABNORMAL HIGH (ref 0.0–100.0)

## 2023-05-02 LAB — TROPONIN I (HIGH SENSITIVITY)
Troponin I (High Sensitivity): 57 ng/L — ABNORMAL HIGH (ref <18)
Troponin I (High Sensitivity): 48 ng/L — ABNORMAL HIGH (ref <18)

## 2023-05-02 LAB — SARS CORONAVIRUS 2 BY RT PCR: SARS Coronavirus 2 by RT PCR: NEGATIVE

## 2023-05-02 MED ORDER — ISOSORBIDE MONONITRATE ER 60 MG PO TB24
60.0000 mg | ORAL_TABLET | Freq: Every day | ORAL | Status: DC
  Administered 2023-05-03 – 2023-05-07 (×5): 60 mg via ORAL
  Filled 2023-05-02 (×5): qty 1

## 2023-05-02 MED ORDER — POTASSIUM CHLORIDE CRYS ER 20 MEQ PO TBCR
40.0000 meq | EXTENDED_RELEASE_TABLET | Freq: Once | ORAL | Status: AC
  Administered 2023-05-02: 40 meq via ORAL
  Filled 2023-05-02: qty 2

## 2023-05-02 MED ORDER — AMIODARONE HCL 200 MG PO TABS
200.0000 mg | ORAL_TABLET | Freq: Every day | ORAL | Status: DC
  Administered 2023-05-03 – 2023-05-07 (×5): 200 mg via ORAL
  Filled 2023-05-02 (×5): qty 1

## 2023-05-02 MED ORDER — FUROSEMIDE 10 MG/ML IJ SOLN
60.0000 mg | Freq: Once | INTRAMUSCULAR | Status: AC
  Administered 2023-05-02: 60 mg via INTRAVENOUS
  Filled 2023-05-02: qty 6

## 2023-05-02 MED ORDER — LOSARTAN POTASSIUM 50 MG PO TABS
100.0000 mg | ORAL_TABLET | Freq: Every day | ORAL | Status: DC
  Administered 2023-05-03 – 2023-05-07 (×5): 100 mg via ORAL
  Filled 2023-05-02 (×5): qty 2

## 2023-05-02 MED ORDER — FUROSEMIDE 10 MG/ML IJ SOLN
80.0000 mg | Freq: Once | INTRAMUSCULAR | Status: AC
  Administered 2023-05-03: 80 mg via INTRAVENOUS
  Filled 2023-05-02: qty 8

## 2023-05-02 MED ORDER — ACETAMINOPHEN 325 MG PO TABS: 650.0000 mg | ORAL_TABLET | ORAL | Status: DC | PRN

## 2023-05-02 MED ORDER — SODIUM CHLORIDE 0.9 % IV SOLN: 250.0000 mL | INTRAVENOUS | Status: DC | PRN

## 2023-05-02 MED ORDER — POTASSIUM CHLORIDE CRYS ER 20 MEQ PO TBCR
40.0000 meq | EXTENDED_RELEASE_TABLET | Freq: Once | ORAL | Status: AC
  Administered 2023-05-03: 40 meq via ORAL
  Filled 2023-05-02: qty 2

## 2023-05-02 MED ORDER — WARFARIN - PHYSICIAN DOSING INPATIENT: Freq: Every day | Status: DC

## 2023-05-02 MED ORDER — SODIUM CHLORIDE 0.9% FLUSH
3.0000 mL | Freq: Two times a day (BID) | INTRAVENOUS | Status: DC
  Administered 2023-05-02 – 2023-05-06 (×7): 3 mL via INTRAVENOUS

## 2023-05-02 MED ORDER — SODIUM CHLORIDE 0.9% FLUSH: 3.0000 mL | INTRAVENOUS | Status: DC | PRN

## 2023-05-02 MED ORDER — CARVEDILOL 12.5 MG PO TABS
12.5000 mg | ORAL_TABLET | Freq: Two times a day (BID) | ORAL | Status: DC
  Administered 2023-05-02 – 2023-05-07 (×10): 12.5 mg via ORAL
  Filled 2023-05-02 (×10): qty 1

## 2023-05-02 MED ORDER — ATORVASTATIN CALCIUM 40 MG PO TABS
40.0000 mg | ORAL_TABLET | Freq: Every day | ORAL | Status: DC
  Administered 2023-05-03 – 2023-05-07 (×5): 40 mg via ORAL
  Filled 2023-05-02 (×5): qty 1

## 2023-05-02 MED ORDER — WARFARIN SODIUM 2 MG PO TABS
4.0000 mg | ORAL_TABLET | Freq: Every evening | ORAL | Status: DC
  Administered 2023-05-02 – 2023-05-06 (×5): 4 mg via ORAL
  Filled 2023-05-02 (×5): qty 2

## 2023-05-02 MED ORDER — DAPAGLIFLOZIN PROPANEDIOL 10 MG PO TABS
10.0000 mg | ORAL_TABLET | Freq: Every day | ORAL | Status: DC
  Administered 2023-05-03 – 2023-05-07 (×5): 10 mg via ORAL
  Filled 2023-05-02 (×5): qty 1

## 2023-05-02 MED ORDER — SPIRONOLACTONE 12.5 MG HALF TABLET
12.5000 mg | ORAL_TABLET | Freq: Every day | ORAL | Status: DC
  Administered 2023-05-03 – 2023-05-05 (×3): 12.5 mg via ORAL
  Filled 2023-05-02 (×3): qty 1

## 2023-05-02 NOTE — Plan of Care (Signed)
  Problem: Coping: Goal: Level of anxiety will decrease Outcome: Progressing   Problem: Pain Managment: Goal: General experience of comfort will improve Outcome: Adequate for Discharge

## 2023-05-02 NOTE — ED Notes (Signed)
Patient requested to be placed on Oxygen due to his history of dyspnea. EDP made aware and given the go ahead. 2lpm via [place] placed on patient.

## 2023-05-02 NOTE — ED Provider Notes (Signed)
Mark Baker Provider Note   CSN: [id number] Arrival date & time: 05/02/23  7829     History {Add pertinent medical, surgical, social history, OB history to HPI:1} Chief Complaint  Patient presents with   Chest Pain    Mark Baker is a 42 y.o. male with PMH as listed below who presents with left sided chest pain x 1 week. No radiation.  Also endorses shortness of breath/dyspnea, sometimes is worse with exertion. Dry cough, nonproductive, no hemoptysis.  Positive orthopnea.  Left-sided chest pain is intermittent and is resolved with Tylenol but worsens when he lies down at night.  Took 120 torsemide last night. Reports very little urine output.  Reports no increase in swelling but does note a 30 pound weight gain over the last 1 month.  Also has not utilized his CPAP in the last month because he has been visiting his ex-wife.. Needs INR checked on coumadin.  Chest pain feels like the last time he had a funny heart rhythm or when he had a CHF exacerbation.  Past Medical History:  Diagnosis Date   Childhood asthma    no exacerbation since age 23    Chronic combined systolic and diastolic CHF (congestive heart failure) (HCC)    a) ECHO (11/2012): EF 40-45%, RV fx difficult to see, nl size b) ECHO (05/2014): EF 20-25%, grade 2 DD, RV mildly dilated and sys fx mod reduced   Hypertension    Migraine    "down to maybe 2/year now; used to have them q other week" (11/26/2017)   NICM (nonischemic cardiomyopathy) (HCC)    a) (11/2012): normal coronaries   OSA on CPAP    wears CPAP   Postoperative wound infection    right knee   Wears glasses        Home Medications Prior to Admission medications   Medication Sig Start Date End Date Taking? Authorizing Provider  acetaminophen (TYLENOL) 500 MG tablet Take 500-2,000 mg by mouth as needed for moderate pain.   Yes [provider]  amiodarone (PACERONE) 200 MG tablet Take 200 mg by mouth  daily. For AFIB. 06/07/22  Yes [provider]  carvedilol (COREG) 12.5 MG tablet Take 12.5 mg by mouth 2 (two) times daily. 04/01/23  Yes [provider]  digoxin (LANOXIN) 0.25 MG tablet Take 1 tablet (250 mcg total) by mouth daily. 08/09/21  Yes Milford, Jessica M, FNP  FARXIGA 10 MG TABS tablet Take 10 mg by mouth daily. 04/11/23  Yes [provider]  isosorbide mononitrate (IMDUR) 60 MG 24 hr tablet Take 1 tablet (60 mg total) by mouth daily. 08/09/21  Yes Milford, [name], FNP  liraglutide (VICTOZA) 18 MG/3ML SOPN Inject 0.6 mg into the skin daily. 03/17/23  Yes [provider]  losartan (COZAAR) 100 MG tablet Take 100 mg by mouth daily. 03/13/23  Yes [provider]  sildenafil (VIAGRA) 50 MG tablet Take 1 tablet (50 mg total) by mouth daily as needed for erectile dysfunction. Please come to the appointment 10/02/20. We cannot do further refill if appointment not kept. 08/24/20  Yes Bensimhon, Bevelyn Buckles, MD  spironolactone (ALDACTONE) 25 MG tablet Take 1/2 tablet (12.5 mg total) by mouth daily. 08/09/21  Yes Milford, [name], FNP  torsemide (DEMADEX) 20 MG tablet Take 60 mg by mouth daily. Indications: accumulation of fluid resulting from chronic heart failure. 02/04/23  Yes [provider]  warfarin (COUMADIN) 4 MG tablet Take 4 mg by mouth  every evening. 03/05/23  Yes [provider]  acetaminophen (TYLENOL) 325 MG tablet Take 1-2 tablets (325-650 mg total) by mouth every 4 (four) hours as needed for mild pain. Patient not taking: Reported on 05/02/2023 08/04/18   Love, [name], PA-C  APIXABAN Everlene Balls) VTE STARTER PACK (10MG  AND 5MG ) Take as directed on package: start with two-5mg  tablets twice daily for 7 days. On day 8, switch to one-5mg  tablet twice daily. Patient not taking: Reported on 05/02/2023 12/02/21   Loeffler, [name], PA-C  atorvastatin (LIPITOR) 40 MG tablet Take 1 tablet (40 mg total) by mouth daily. Patient not taking:  Reported on 05/02/2023 08/09/21   [name], FNP  hydrALAZINE (APRESOLINE) 50 MG tablet Take 1 tablet (50 mg total) by mouth 3 (three) times daily. Needs appt Patient not taking: Reported on 05/02/2023 08/09/21   [name], FNP  Potassium Chloride ER 20 MEQ TBCR Take 20 mEq by mouth daily as needed (cramping). MUST KEEP UPCOMING APPT FOR ADDITIONAL REFILLS 06/23/20 12/19/20  Bensimhon, Bevelyn Buckles, MD      Allergies    Aspirin and Entresto [sacubitril-valsartan]    Review of Systems   Review of Systems A 10 Baker review of systems was performed and is negative unless otherwise reported in HPI.  Physical Exam Updated Vital Signs BP 100/77 (BP Location: Right Wrist)   Pulse 76   Temp 98 F (36.7 C) (Oral)   Resp 16   Wt (!) 206.2 kg   SpO2 96%   BMI 58.35 kg/m  Physical Exam General: Normal appearing obese male, lying in bed.  HEENT: Sclera anicteric, MMM, trachea midline.  Cardiology: RRR, no murmurs/rubs/gallops.  Resp: Normal respiratory rate and effort. CTAB, no wheezes, rhonchi, crackles.  Abd: Soft, non-tender, non-distended. No rebound tenderness or guarding.  GU: Deferred. MSK: 2+ pitting edema bilaterally up to knees.  No signs of trauma. Extremities without deformity or TTP. No cyanosis or clubbing. Skin: warm, dry. Neuro: A&Ox4, CNs II-XII grossly intact. MAEs. Sensation grossly intact.  Psych: Normal mood and affect.   ED Results / Procedures / Treatments   Labs (all labs ordered are listed, but only abnormal results are displayed) Labs Reviewed  CBC - Abnormal; Notable for the following components:      Result Value   nRBC 0.3 (*)    All other components within normal limits  PROTIME-INR - Abnormal; Notable for the following components:   Prothrombin Time 30.7 (*)    INR 2.9 (*)    All other components within normal limits  COMPREHENSIVE METABOLIC PANEL - Abnormal; Notable for the following components:   Potassium 3.4 (*)    Chloride 96 (*)     Glucose, Bld 125 (*)    BUN 32 (*)    Creatinine, Ser 1.88 (*)    Calcium 8.7 (*)    Total Bilirubin 2.9 (*)    GFR, Estimated 45 (*)    All other components within normal limits  BRAIN NATRIURETIC PEPTIDE - Abnormal; Notable for the following components:   B Natriuretic Peptide 1,274.3 (*)    All other components within normal limits  TROPONIN I (HIGH SENSITIVITY) - Abnormal; Notable for the following components:   Troponin I (High Sensitivity) 57 (*)    All other components within normal limits  TROPONIN I (HIGH SENSITIVITY) - Abnormal; Notable for the following components:   Troponin I (High Sensitivity) 48 (*)    All other components within normal limits  SARS CORONAVIRUS 2 BY RT  PCR    EKG EKG Interpretation Date/Time:  Friday May 02 2023 08:23:22 EDT Ventricular Rate:  80 PR Interval:  224 QRS Duration:  156 QT Interval:  441 QTC Calculation: 509 R Axis:   -51  Text Interpretation: Sinus rhythm Atrial premature complex Prolonged PR interval Left atrial enlargement Left bundle branch block Confirmed by Vivi Barrack [phone]) on 05/02/2023 8:40:13 AM  Radiology DG Chest Port 1 View  Result Date: 05/02/2023 CLINICAL DATA:  Left chest pain for 1 week EXAM: PORTABLE CHEST 1 VIEW COMPARISON:  Chest radiograph 07/31/2018 FINDINGS: The heart is markedly enlarged. The upper mediastinal contours are normal There is pulmonary vascular congestion and moderate pulmonary interstitial edema. There is no definite focal consolidation. There is no definite pleural effusion. There is no pneumothorax There is no acute osseous abnormality. IMPRESSION: Cardiomegaly and moderate pulmonary interstitial edema. Electronically Signed   By: Lesia Hausen M.D.   On: 05/02/2023 09:45    Procedures Procedures  {Document cardiac monitor, telemetry assessment procedure when appropriate:1}  Medications Ordered in ED Medications  furosemide (LASIX) injection 60 mg (60 mg Intravenous Given 05/02/23  1014)  potassium chloride SA (KLOR-CON M) CR tablet 40 mEq (40 mEq Oral Given 05/02/23 1013)    ED Course/ Medical Decision Making/ A&P                          Medical Decision Making Amount and/or Complexity of Data Reviewed Labs: ordered. Decision-making details documented in ED Course. Radiology: ordered. Decision-making details documented in ED Course.  Risk Prescription drug management. Decision regarding hospitalization.    This patient presents to the ED for concern of CP/dyspnea, swelling, weight gain; this involves an extensive number of treatment options, and is a complaint that carries with it a high risk of complications and morbidity.  I considered the following differential and admission for this acute, potentially life threatening condition.   MDM:    DDX for chest pain includes but is not limited to:  Patient with history of severe HFrEF presents with dyspnea, orthopnea, constant left-sided chest pain, weight gain of 30 pounds over the last month.  He had recently moved from the area was following with [facility] cardiology and has been taking his torsemide.  EKG does not demonstrate any arrhythmia or signs of ischemia.  Overall he has no respiratory distress, tachypnea, or hypoxia which is reassuring.  Most likely patient has volume overload and heart failure exacerbation which will require diuresis, especially given his reported weight gain.  For his chest pain consider ACS/arrhythmia we will obtain troponin.  He has no significant risk factors for PE, and without any asymmetric swelling or tachycardia, I do not believe it is the most likely diagnosis. Very low suspicion for aortic dissection given presenting sx. No abdominal pain and no c/f biliary disease.  He does have a dry cough but has been afebrile and no productive cough, chest x-ray does not demonstrate any concern for pneumonia or infiltrate.  No wheezing to suggest COPD.   Clinical Course as of 05/02/23 1407  Fri May 02, 2023  0903 CBC(!) Unremarkable in the context of this patient's presentation  [HN]  0946 Troponin I (High Sensitivity)(!): 57 Mildly elevated [HN]  0957 B Natriuretic Peptide(!): 1,274.3 +BNP higher than 800 on last reading [HN]  0957 DG Chest Port 1 View Cardiomegaly and moderate pulmonary interstitial edema. [HN]  0957 Will give lasix and some potassium supplementation for K 3.4 [HN]  1259 Admitted to cardiology Dr. Cristal Deer [HN]    Clinical Course User Index [HN] Loetta Rough, MD    Labs: I Ordered, and personally interpreted labs.  The pertinent results include: Those listed above  Imaging Studies ordered: I ordered imaging studies including chest x-ray I independently visualized and interpreted imaging. I agree with the radiologist interpretation  Additional history obtained from chart review.  External records from outside source obtained and reviewed including [facility]  Cardiac Monitoring: The patient was maintained on a cardiac monitor.  I personally viewed and interpreted the cardiac monitored which showed an underlying rhythm of: Normal sinus rhythm  Reevaluation: After the interventions noted above, I reevaluated the patient and found that they have :stayed the same  Social Determinants of Health:  lives independently  Disposition: Patient with HFrEF exacerbation will require additional diuresis.  K3.4 and repleted.  He has elevated and relatively flat troponin in the setting of advanced heart failure and cardiology is aware.  Patient will be admitted to cardiology for further diuresis and monitoring.  He does have 1 episode of desat to 88% while coughing and he is placed on 2 L nasal cannula for comfort at rest.  Co morbidities that complicate the patient evaluation  Past Medical History:  Diagnosis Date   Childhood asthma    no exacerbation since age 2    Chronic combined systolic and diastolic CHF (congestive heart failure) (HCC)    a) ECHO (11/2012):  EF 40-45%, RV fx difficult to see, nl size b) ECHO (05/2014): EF 20-25%, grade 2 DD, RV mildly dilated and sys fx mod reduced   Hypertension    Migraine    "down to maybe 2/year now; used to have them q other week" (11/26/2017)   NICM (nonischemic cardiomyopathy) (HCC)    a) (11/2012): normal coronaries   OSA on CPAP    wears CPAP   Postoperative wound infection    right knee   Wears glasses      Medicines Meds ordered this encounter  Medications   furosemide (LASIX) injection 60 mg   potassium chloride SA (KLOR-CON M) CR tablet 40 mEq    I have reviewed the patients home medicines and have made adjustments as needed  Problem List / ED Course: Problem List Items Addressed This Visit       Cardiovascular and Mediastinum   CHF (congestive heart failure) (HCC) - Primary   Relevant Medications   warfarin (COUMADIN) 4 MG tablet   amiodarone (PACERONE) 200 MG tablet   carvedilol (COREG) 12.5 MG tablet   losartan (COZAAR) 100 MG tablet   torsemide (DEMADEX) 20 MG tablet   Other Visit Diagnoses     Orthopnea                {Document critical care time when appropriate:1} {Document review of labs and clinical decision tools ie heart score, Chads2Vasc2 etc:1}  {Document your independent review of radiology images, and any outside records:1} {Document your discussion with family members, caretakers, and with consultants:1} {Document social determinants of health affecting pt's care:1} {Document your decision making why or why not admission, treatments were needed:1}  This note was created using dictation software, which may contain spelling or grammatical errors.

## 2023-05-02 NOTE — H&P (Signed)
Cardiology Admission History and Physical   Patient ID: Mark Baker MRN: [id number]; DOB: 1980/09/25   Admission date: 05/02/2023  PCP:  Sherren Mocha, MD   [facility] Providers Cardiologist:  None  Advanced Heart Failure:  [name], MD       Chief Complaint: Chest pain shortness of breath  Patient Profile:   Mark Baker is a 42 y.o. 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  History of Present Illness:   Mark Baker presented to the emergency department with 1 week of chest pain and shortness of breath.  Chest tightness is worse when he lays down at night and he has having some orthopnea.  Notes that he has been having very little urine output and has gained roughly 30 pounds in the last 1 month.  Has also had intermittent use of his CPAP.  Presented to the emergency department due to progressive symptoms.  On presentation to the emergency department he was hemodynamically stable.  His initial high-sensitivity troponin was 57 and repeat was 48.  His BNP was 1274 which was above any of his prior.  He also had a mild worsening of his kidney function with a creatinine of 1.88 on arrival from prior of 1.58.  On his initial ECG he was in sinus rhythm at 80 bpm with some PACs.  He has an LBBB.  Initial chest x-ray reveals pulmonary vascular congestion and moderate pulmonary interstitial edema.   Past Medical History:  Diagnosis Date   Childhood asthma    no exacerbation since age 22    Chronic combined systolic and diastolic CHF (congestive heart failure) (HCC)    a) ECHO (11/2012): EF 40-45%, RV fx difficult to see, nl size b) ECHO (05/2014): EF 20-25%, grade 2 DD, RV mildly dilated and sys fx mod reduced   Hypertension    Migraine    "down  to maybe 2/year now; used to have them q other week" (11/26/2017)   NICM (nonischemic cardiomyopathy) (HCC)    a) (11/2012): normal coronaries   OSA on CPAP    wears CPAP   Postoperative wound infection    right knee   Wears glasses     Past Surgical History:  Procedure Laterality Date   CARDIOVERSION N/A 11/07/2016   Procedure: CARDIOVERSION;  Surgeon: Dolores Patty, MD;  Location: [facility] OR;  Service: Cardiovascular;  Laterality: N/A;  Will need extra help positioning patient   INCISION AND DRAINAGE OF WOUND Right 07/23/2018   Procedure: Right knee  incisional irrigation and debridement;  Surgeon: Yolonda Kida, MD;  Location: [facility] OR;  Service: Orthopedics;  Laterality: Right;   KNEE ARTHROSCOPY Right 2003   ANTERIOR CRUCIATE LIGAMENT REPAIR    LEFT HEART CATHETERIZATION WITH CORONARY ANGIOGRAM N/A 11/24/2012   Procedure: LEFT HEART CATHETERIZATION WITH CORONARY ANGIOGRAM;  Surgeon: Peter M Swaziland, MD;  Location: [facility] CATH LAB;  Service: Cardiovascular;  Laterality: N/A;   QUADRICEPS TENDON REPAIR Right 06/10/2018   Procedure: REPAIR QUADRICEP TENDON;  Surgeon: Yolonda Kida, MD;  Location: [facility] OR;  Service: Orthopedics;  Laterality: Right;   RIGHT/LEFT HEART CATH AND CORONARY ANGIOGRAPHY N/A 03/18/2017   Procedure: Right/Left Heart Cath and Coronary Angiography;  Surgeon: Dolores Patty, MD;  Location: [facility] INVASIVE CV LAB;  Service: Cardiovascular;  Laterality:  N/A;   TEE WITHOUT CARDIOVERSION N/A 11/07/2016   Procedure: TRANSESOPHAGEAL ECHOCARDIOGRAM (TEE);  Surgeon: Dolores Patty, MD;  Location: [facility] OR;  Service: Cardiovascular;  Laterality: N/A;     Medications Prior to Admission: Prior to Admission medications   Medication Sig Start Date End Date Taking? Authorizing Provider  acetaminophen (TYLENOL) 500 MG tablet Take 500-2,000 mg by mouth as needed for moderate pain.   Yes [provider]  amiodarone (PACERONE) 200 MG tablet Take 200 mg by mouth daily.  For AFIB. 06/07/22  Yes [provider]  carvedilol (COREG) 12.5 MG tablet Take 12.5 mg by mouth 2 (two) times daily. 04/01/23  Yes [provider]  digoxin (LANOXIN) 0.25 MG tablet Take 1 tablet (250 mcg total) by mouth daily. 08/09/21  Yes Milford, Jessica M, FNP  FARXIGA 10 MG TABS tablet Take 10 mg by mouth daily. 04/11/23  Yes [provider]  isosorbide mononitrate (IMDUR) 60 MG 24 hr tablet Take 1 tablet (60 mg total) by mouth daily. 08/09/21  Yes Milford, [name], FNP  liraglutide (VICTOZA) 18 MG/3ML SOPN Inject 0.6 mg into the skin daily. 03/17/23  Yes [provider]  losartan (COZAAR) 100 MG tablet Take 100 mg by mouth daily. 03/13/23  Yes [provider]  sildenafil (VIAGRA) 50 MG tablet Take 1 tablet (50 mg total) by mouth daily as needed for erectile dysfunction. Please come to the appointment 10/02/20. We cannot do further refill if appointment not kept. 08/24/20  Yes Bensimhon, Bevelyn Buckles, MD  spironolactone (ALDACTONE) 25 MG tablet Take 1/2 tablet (12.5 mg total) by mouth daily. 08/09/21  Yes Milford, [name], FNP  torsemide (DEMADEX) 20 MG tablet Take 60 mg by mouth daily. Indications: accumulation of fluid resulting from chronic heart failure. 02/04/23  Yes [provider]  warfarin (COUMADIN) 4 MG tablet Take 4 mg by mouth every evening. 03/05/23  Yes [provider]  acetaminophen (TYLENOL) 325 MG tablet Take 1-2 tablets (325-650 mg total) by mouth every 4 (four) hours as needed for mild pain. Patient not taking: Reported on 05/02/2023 08/04/18   Love, [name], PA-C  APIXABAN Everlene Balls) VTE STARTER PACK (10MG  AND 5MG ) Take as directed on package: start with two-5mg  tablets twice daily for 7 days. On day 8, switch to one-5mg  tablet twice daily. Patient not taking: Reported on 05/02/2023 12/02/21   Loeffler, [name], PA-C  atorvastatin (LIPITOR) 40 MG tablet Take 1 tablet (40 mg total) by mouth daily. Patient not taking: Reported  on 05/02/2023 08/09/21   [name], FNP  hydrALAZINE (APRESOLINE) 50 MG tablet Take 1 tablet (50 mg total) by mouth 3 (three) times daily. Needs appt Patient not taking: Reported on 05/02/2023 08/09/21   [name], FNP  Potassium Chloride ER 20 MEQ TBCR Take 20 mEq by mouth daily as needed (cramping). MUST KEEP UPCOMING APPT FOR ADDITIONAL REFILLS 06/23/20 12/19/20  Bensimhon, Bevelyn Buckles, MD     Allergies:    Allergies  Allergen Reactions   Aspirin Hives, Itching and Rash   Entresto [Sacubitril-Valsartan] Other (See Comments) and Cough    Was on Entresto 07/2016-09/2016    Social History:   Social History   Socioeconomic History   Marital status: Divorced    Spouse name: Not on file   Number of children: Not on file   Years of education: Not on file   Highest education level: Not on file  Occupational History   Not on file  Tobacco Use  Smoking status: Never   Smokeless tobacco: Never  Vaping Use   Vaping status: Never Used  Substance and Sexual Activity   Alcohol use: Not Currently    Alcohol/week: 0.0 standard drinks of alcohol    Comment: 11/26/2017 "nothing since 07/22/18   Drug use: Never   Sexual activity: Yes    Comment: Works in Consulting civil engineer at [employer].   Other Topics Concern   Not on file  Social History Narrative   Lives in [place] with wife of 4 years.  Works for [employer] at [employer].  1 son and 2 daughters.     Denies cigarettes. Drank 1 beer last night 11/22/12. Denies drugs          Social Determinants of Health   Financial Resource Strain: Low Risk  (01/14/2022)   [facility]   Overall Financial Resource Strain (CARDIA)    Difficulty of Paying Living Expenses: Not hard at all  Food Insecurity: Unknown (05/02/2023)   Hunger Vital Sign    Worried About Running Out of Food in the Last Year: Never true    Ran Out of Food in the Last Year: Not on file  Transportation Needs:  No Transportation Needs (05/02/2023)   PRAPARE - Administrator, Civil Service (Medical): No    Lack of Transportation (Non-Medical): No  Physical Activity: Sufficiently Active (01/14/2022)   [facility]   Exercise Vital Sign    Days of Exercise per Week: 5 days    Minutes of Exercise per Session: 30 min  Stress: Not on file  Social Connections: Feeling Socially Integrated (02/26/2022)   [facility]   OASIS D0700: Social Isolation    Frequency of experiencing loneliness or isolation: Never  Intimate Partner Violence: Not At Risk (05/02/2023)   Humiliation, Afraid, Rape, and Kick questionnaire    Fear of Current or Ex-Partner: No    Emotionally Abused: No    Physically Abused: No    Sexually Abused: No    Family History:   The patient's family history includes Heart failure in his father; Hypertension in his father, mother, sister, and another family member; Stroke in his maternal grandmother. There is no history of Diabetes or Hyperlipidemia.    ROS:  Please see the history of present illness.  All other ROS reviewed and negative.     Physical Exam/Data:   Vitals:   05/02/23 0845 05/02/23 1234 05/02/23 1804 05/02/23 1829  BP:  100/77  112/77  Pulse: 73 76  84  Resp: (!) 23 16  20   Temp:  98 F (36.7 C) 98.1 F (36.7 C) 97.7 F (36.5 C)  TempSrc:  Oral Oral Oral  SpO2: 94% 96%  99%  Weight:       No intake or output data in the 24 hours ending 05/02/23 2128    05/02/2023    8:25 AM 08/09/2021   11:13 AM 10/15/2018   12:17 PM  Last 3 Weights  Weight (lbs) 454 lb 8 oz 501 lb 3.2 oz 464 lb 6.4 oz  Weight (kg) 206.16 kg 227.343 kg 210.65 kg     Body mass index is 58.35 kg/m.  General:  Well nourished, well developed, in no acute distress*** HEENT: normal Neck: no*** JVD Vascular: No carotid bruits; Distal pulses 2+  bilaterally   Cardiac:  normal  S1, S2; RRR; no murmur *** Lungs:  clear to auscultation bilaterally, no wheezing, rhonchi or rales  Abd: soft, nontender, no hepatomegaly  Ext: no*** edema Musculoskeletal:  No deformities, BUE and BLE strength normal and equal Skin: warm and dry  Neuro:  CNs 2-12 intact, no focal abnormalities noted Psych:  Normal affect    EKG:  The ECG that was done in the emergency department was personally reviewed and demonstrates normal sinus rhythm  Relevant CV Studies: None  Laboratory Data:  High Sensitivity Troponin:   Recent Labs  Lab 05/02/23 0838 05/02/23 1019  TROPONINIHS 57* 48*      Chemistry Recent Labs  Lab 05/02/23 0838  NA 137  K 3.4*  CL 96*  CO2 28  GLUCOSE 125*  BUN 32*  CREATININE 1.88*  CALCIUM 8.7*  GFRNONAA 45*  ANIONGAP 13    Recent Labs  Lab 05/02/23 0838  PROT 7.0  ALBUMIN 3.6  AST 25  ALT 32  ALKPHOS 46  BILITOT 2.9*   Lipids No results for input(s): "CHOL", "TRIG", "HDL", "LABVLDL", "LDLCALC", "CHOLHDL" in the last 168 hours. Hematology Recent Labs  Lab 05/02/23 0838  WBC 7.5  RBC 4.78  HGB 13.4  HCT 44.2  MCV 92.5  MCH 28.0  MCHC 30.3  RDW 13.4  PLT 256   Thyroid No results for input(s): "TSH", "FREET4" in the last 168 hours. BNP Recent Labs  Lab 05/02/23 0838  BNP 1,274.3*    DDimer No results for input(s): "DDIMER" in the last 168 hours.   Radiology/Studies:  DG Chest Port 1 View  Result Date: 05/02/2023 CLINICAL DATA:  Left chest pain for 1 week EXAM: PORTABLE CHEST 1 VIEW COMPARISON:  Chest radiograph 07/31/2018 FINDINGS: The heart is markedly enlarged. The upper mediastinal contours are normal There is pulmonary vascular congestion and moderate pulmonary interstitial edema. There is no definite focal consolidation. There is no definite pleural effusion. There is no pneumothorax There is no acute osseous abnormality. IMPRESSION: Cardiomegaly and moderate pulmonary interstitial edema.  Electronically Signed   By: Lesia Hausen M.D.   On: 05/02/2023 09:45     Assessment and Plan:   Acute on chronic systolic heart failure exacerbation Paroxysmal atrial fibrillation Acute on chronic kidney disease Hypertension CAD   Risk Assessment/Risk Scores:       New York Heart Association (NYHA) Functional Class NYHA Class IV    Code Status: Full Code  Severity of Illness: The appropriate patient status for this patient is INPATIENT. Inpatient status is judged to be reasonable and necessary in order to provide the required intensity of service to ensure the patient's safety. The patient's presenting symptoms, physical exam findings, and initial radiographic and laboratory data in the context of their chronic comorbidities is felt to place them at high risk for further clinical deterioration. Furthermore, it is not anticipated that the patient will be medically stable for discharge from the hospital within 2 midnights of admission.   * I certify that at the point of admission it is my clinical judgment that the patient will require inpatient hospital care spanning beyond 2 midnights from the point of admission due to high intensity of service, high risk for further deterioration and high frequency of surveillance required.*   For questions or updates, please contact [facility] Please consult [email or web address] for contact info under     Signed, Joellen Jersey, MD  05/02/2023 9:28 PM

## 2023-05-02 NOTE — ED Notes (Signed)
ED TO INPATIENT HANDOFF REPORT  ED Nurse Name and Phone #: Glennie Rodda [place], RN  [phone]  S Name/Age/Gender Mark Baker 42 y.o. male Room/Bed: MH04/MH04  Code Status   Code Status: Prior  Home/SNF/Other Home Patient oriented to: self, place, time, and situation Is this baseline? Yes   Triage Complete: Triage complete  Chief Complaint Acute exacerbation of CHF (congestive heart failure) (HCC) [I50.9]  Triage Note Pt presents with complaint of left sided chest pain x 1 week. No radiation. Sob. Dry cough  Took 120 torsemide last night. Reports very little urine output. No increase in swelling. Needs INR checked on coumadin.  30 lbs weight gain in one month   Allergies Allergies  Allergen Reactions   Aspirin Hives, Itching and Rash   Entresto [Sacubitril-Valsartan] Other (See Comments) and Cough    Was on Entresto 07/2016-09/2016    Level of Care/Admitting Diagnosis ED Disposition     ED Disposition  Admit   Condition  --   Comment  Hospital Area: [facility] [[id number]]  Level of Care: Telemetry Cardiac [[id number]]  May admit patient to [facility] or [facility] if equivalent level of care is available:: Yes  Interfacility transfer: Yes  Covid Evaluation: Asymptomatic - no recent exposure (last 10 days) testing not required  Diagnosis: Acute exacerbation of CHF (congestive heart failure) [facility]) [[id number]]  Admitting Physician: Jodelle Red [phone]  Attending Physician: Loetta Rough [[id number]]  Certification:: I certify this patient will need inpatient services for at least 2 midnights  Expected Medical Readiness: 05/05/2023          B Medical/Surgery History Past Medical History:  Diagnosis Date   Childhood asthma    no exacerbation since age 34    Chronic combined systolic and diastolic CHF (congestive heart failure) (HCC)    a) ECHO (11/2012): EF 40-45%, RV fx difficult to see, nl size b) ECHO (05/2014): EF 20-25%, grade 2 DD, RV mildly  dilated and sys fx mod reduced   Hypertension    Migraine    "down to maybe 2/year now; used to have them q other week" (11/26/2017)   NICM (nonischemic cardiomyopathy) (HCC)    a) (11/2012): normal coronaries   OSA on CPAP    wears CPAP   Postoperative wound infection    right knee   Wears glasses    Past Surgical History:  Procedure Laterality Date   CARDIOVERSION N/A 11/07/2016   Procedure: CARDIOVERSION;  Surgeon: Dolores Patty, MD;  Location: [facility] OR;  Service: Cardiovascular;  Laterality: N/A;  Will need extra help positioning patient   INCISION AND DRAINAGE OF WOUND Right 07/23/2018   Procedure: Right knee  incisional irrigation and debridement;  Surgeon: Yolonda Kida, MD;  Location: [facility] OR;  Service: Orthopedics;  Laterality: Right;   KNEE ARTHROSCOPY Right 2003   ANTERIOR CRUCIATE LIGAMENT REPAIR    LEFT HEART CATHETERIZATION WITH CORONARY ANGIOGRAM N/A 11/24/2012   Procedure: LEFT HEART CATHETERIZATION WITH CORONARY ANGIOGRAM;  Surgeon: Peter M Swaziland, MD;  Location: [facility] CATH LAB;  Service: Cardiovascular;  Laterality: N/A;   QUADRICEPS TENDON REPAIR Right 06/10/2018   Procedure: REPAIR QUADRICEP TENDON;  Surgeon: Yolonda Kida, MD;  Location: [facility] OR;  Service: Orthopedics;  Laterality: Right;   RIGHT/LEFT HEART CATH AND CORONARY ANGIOGRAPHY N/A 03/18/2017   Procedure: Right/Left Heart Cath and Coronary Angiography;  Surgeon: Dolores Patty, MD;  Location: [facility] INVASIVE CV LAB;  Service: Cardiovascular;  Laterality: N/A;   TEE WITHOUT CARDIOVERSION N/A 11/07/2016  Procedure: TRANSESOPHAGEAL ECHOCARDIOGRAM (TEE);  Surgeon: Dolores Patty, MD;  Location: [facility] OR;  Service: Cardiovascular;  Laterality: N/A;     A IV Location/Drains/Wounds Patient Lines/Drains/Airways Status     Active Line/Drains/Airways     Name Placement date Placement time Site Days   Peripheral IV 05/02/23 20 G Right Antecubital 05/02/23  0837  Antecubital  less than 1   Incision  (Closed) 06/10/18 Knee Right 06/10/18  1537  -- 1787   Incision (Closed) 07/23/18 Leg Right 07/23/18  1634  -- 1744            Intake/Output Last 24 hours No intake or output data in the 24 hours ending 05/02/23 1801  Labs/Imaging Results for orders placed or performed during the hospital encounter of 05/02/23 (from the past 48 hour(s))  CBC     Status: Abnormal   Collection Time: 05/02/23  8:38 AM  Result Value Ref Range   WBC 7.5 4.0 - 10.5 K/uL   RBC 4.78 4.22 - 5.81 MIL/uL   Hemoglobin 13.4 13.0 - 17.0 g/dL   HCT 16.1 09.6 - 04.5 %   MCV 92.5 80.0 - 100.0 fL   MCH 28.0 26.0 - 34.0 pg   MCHC 30.3 30.0 - 36.0 g/dL   RDW 40.9 81.1 - 91.4 %   Platelets 256 150 - 400 K/uL   nRBC 0.3 (H) 0.0 - 0.2 %    Comment: Performed at [facility], [address] [place], [place] [address]  Troponin I (High Sensitivity)     Status: Abnormal   Collection Time: 05/02/23  8:38 AM  Result Value Ref Range   Troponin I (High Sensitivity) 57 (H) <18 ng/L    Comment: (NOTE) Elevated high sensitivity troponin I (hsTnI) values and significant  changes across serial measurements may suggest ACS but many other  chronic and acute conditions are known to elevate hsTnI results.  Refer to the "Links" section for chest pain algorithms and additional  guidance. Performed at [facility], [address] [place], [place] [address]   Protime-INR     Status: Abnormal   Collection Time: 05/02/23  8:38 AM  Result Value Ref Range   Prothrombin Time 30.7 (H) 11.4 - 15.2 seconds   INR 2.9 (H) 0.8 - 1.2    Comment: (NOTE) INR goal varies based on device and disease states. Performed at [facility], [address] [place], [place] [address]   Comprehensive metabolic panel     Status: Abnormal   Collection Time: 05/02/23  8:38 AM  Result Value Ref Range   Sodium 137 135 - 145 mmol/L   Potassium 3.4 (L) 3.5 - 5.1 mmol/L   Chloride 96 (L) 98 - 111 mmol/L   CO2 28 22 -  32 mmol/L   Glucose, Bld 125 (H) 70 - 99 mg/dL    Comment: Glucose reference range applies only to samples taken after fasting for at least 8 hours.   BUN 32 (H) 6 - 20 mg/dL   Creatinine, Ser 4.69 (H) 0.61 - 1.24 mg/dL   Calcium 8.7 (L) 8.9 - 10.3 mg/dL   Total Protein 7.0 6.5 - 8.1 g/dL   Albumin 3.6 3.5 - 5.0 g/dL   AST 25 15 - 41 U/L   ALT 32 0 - 44 U/L   Alkaline Phosphatase 46 38 - 126 U/L   Total Bilirubin 2.9 (H) 0.3 - 1.2 mg/dL   GFR, Estimated 45 (L) >60 mL/min  Comment: (NOTE) Calculated using the CKD-EPI Creatinine Equation (2021)    Anion gap 13 5 - 15    Comment: Performed at [facility], [address] [place], [place] [address]  Brain natriuretic peptide     Status: Abnormal   Collection Time: 05/02/23  8:38 AM  Result Value Ref Range   B Natriuretic Peptide 1,274.3 (H) 0.0 - 100.0 pg/mL    Comment: Performed at [facility], [address] [place], [place] [address]  SARS Coronavirus 2 by RT PCR (hospital order, performed in [facility] hospital lab) *cepheid single result test* Anterior Nasal Swab     Status: None   Collection Time: 05/02/23  8:42 AM   Specimen: Anterior Nasal Swab  Result Value Ref Range   SARS Coronavirus 2 by RT PCR NEGATIVE NEGATIVE    Comment: (NOTE) SARS-CoV-2 target nucleic acids are NOT DETECTED.  The SARS-CoV-2 RNA is generally detectable in upper and lower respiratory specimens during the acute phase of infection. The lowest concentration of SARS-CoV-2 viral copies this assay can detect is 250 copies / mL. A negative result does not preclude SARS-CoV-2 infection and should not be used as the sole basis for treatment or other patient management decisions.  A negative result may occur with improper specimen collection / handling, submission of specimen other than nasopharyngeal swab, presence of viral mutation(s) within the areas targeted by this assay, and inadequate number of viral copies (<250 copies /  mL). A negative result must be combined with clinical observations, patient history, and epidemiological information.  Fact Sheet for Patients:   RoadLapTop.co.za  Fact Sheet for Healthcare Providers: http://kim-miller.com/  This test is not yet approved or  cleared by the [place] FDA and has been authorized for detection and/or diagnosis of SARS-CoV-2 by FDA under an Emergency Use Authorization (EUA).  This EUA will remain in effect (meaning this test can be used) for the duration of the COVID-19 declaration under Section 564(b)(1) of the Act, 21 U.S.C. section 360bbb-3(b)(1), unless the authorization is terminated or revoked sooner.  Performed at [facility], [address] [place], [place] [address]   Troponin I (High Sensitivity)     Status: Abnormal   Collection Time: 05/02/23 10:19 AM  Result Value Ref Range   Troponin I (High Sensitivity) 48 (H) <18 ng/L    Comment: (NOTE) Elevated high sensitivity troponin I (hsTnI) values and significant  changes across serial measurements may suggest ACS but many other  chronic and acute conditions are known to elevate hsTnI results.  Refer to the "Links" section for chest pain algorithms and additional  guidance. Performed at [facility], [address] [place], [place] [address]    DG Chest Friona 1 View  Result Date: 05/02/2023 CLINICAL DATA:  Left chest pain for 1 week EXAM: PORTABLE CHEST 1 VIEW COMPARISON:  Chest radiograph 07/31/2018 FINDINGS: The heart is markedly enlarged. The upper mediastinal contours are normal There is pulmonary vascular congestion and moderate pulmonary interstitial edema. There is no definite focal consolidation. There is no definite pleural effusion. There is no pneumothorax There is no acute osseous abnormality. IMPRESSION: Cardiomegaly and moderate pulmonary interstitial edema. Electronically Signed   By: Lesia Hausen M.D.   On:  05/02/2023 09:45    Pending Labs Unresulted Labs (From admission, onward)    None       Vitals/Pain Today's Vitals   05/02/23 0852 05/02/23 1117 05/02/23 1234 05/02/23 1500  BP:   100/77   Pulse:   76   Resp:   16   Temp:   98 F (36.7 C)   TempSrc:   Oral   SpO2:   96%   Weight:      PainSc: 0-No pain 0-No pain  0-No pain    Isolation Precautions No active isolations  Medications Medications  furosemide (LASIX) injection 60 mg (60 mg Intravenous Given 05/02/23 1014)  potassium chloride SA (KLOR-CON M) CR tablet 40 mEq (40 mEq Oral Given 05/02/23 1013)    Mobility walks     Focused Assessments Cardiac Assessment Handoff:  Cardiac Rhythm: Normal sinus rhythm Lab Results  Component Value Date   CKTOTAL 260 (H) 02/11/2012   CKMB 3.9 02/11/2012   TROPONINI 0.07 (HH) 11/26/2017   Lab Results  Component Value Date   DDIMER 0.39 11/25/2017   Does the Patient currently have chest pain? No   , Pulmonary Assessment Handoff:  Lung sounds:   O2 Device: Room Air      R Recommendations: See Admitting Provider Note  Report given to:   Additional Notes: Pt A&O x4. Oxygen via nasal cannula @ 2L. Pt SOB and left sided chest pain x 1 week. Pt has oxygen for comfort at his request. IV flushes well. No complaints of pain at this time.

## 2023-05-02 NOTE — ED Triage Notes (Addendum)
Pt presents with complaint of left sided chest pain x 1 week. No radiation. Sob. Dry cough  Took 120 torsemide last night. Reports very little urine output. No increase in swelling. Needs INR checked on coumadin.  30 lbs weight gain in one month

## 2023-05-03 ENCOUNTER — Inpatient Hospital Stay (HOSPITAL_COMMUNITY): Payer: Commercial Managed Care - PPO

## 2023-05-03 DIAGNOSIS — I5043 Acute on chronic combined systolic (congestive) and diastolic (congestive) heart failure: Secondary | ICD-10-CM

## 2023-05-03 DIAGNOSIS — I5021 Acute systolic (congestive) heart failure: Secondary | ICD-10-CM

## 2023-05-03 LAB — CBC WITH DIFFERENTIAL/PLATELET
Abs Immature Granulocytes: 0.03 10*3/uL (ref 0.00–0.07)
Basophils Absolute: 0.1 10*3/uL (ref 0.0–0.1)
Basophils Relative: 1 %
Eosinophils Absolute: 0.2 10*3/uL (ref 0.0–0.5)
Eosinophils Relative: 2 %
HCT: 44.8 % (ref 39.0–52.0)
Hemoglobin: 13.4 g/dL (ref 13.0–17.0)
Immature Granulocytes: 0 %
Lymphocytes Relative: 21 %
Lymphs Abs: 1.5 10*3/uL (ref 0.7–4.0)
MCH: 27.5 pg (ref 26.0–34.0)
MCHC: 29.9 g/dL — ABNORMAL LOW (ref 30.0–36.0)
MCV: 91.8 fL (ref 80.0–100.0)
Monocytes Absolute: 1.1 10*3/uL — ABNORMAL HIGH (ref 0.1–1.0)
Monocytes Relative: 16 %
Neutro Abs: 4.2 10*3/uL (ref 1.7–7.7)
Neutrophils Relative %: 60 %
Platelets: 250 10*3/uL (ref 150–400)
RBC: 4.88 MIL/uL (ref 4.22–5.81)
RDW: 13.6 % (ref 11.5–15.5)
WBC: 7 10*3/uL (ref 4.0–10.5)
nRBC: 0 % (ref 0.0–0.2)

## 2023-05-03 LAB — ECHOCARDIOGRAM COMPLETE
Area-P 1/2: 7.99 cm2
MV M vel: 4.13 m/s
MV Peak grad: 68.2 mmHg
Radius: 0.4 cm
S' Lateral: 6.5 cm
Weight: 7171.12 oz

## 2023-05-03 LAB — BASIC METABOLIC PANEL
Anion gap: 11 (ref 5–15)
BUN: 33 mg/dL — ABNORMAL HIGH (ref 6–20)
CO2: 28 mmol/L (ref 22–32)
Calcium: 8.6 mg/dL — ABNORMAL LOW (ref 8.9–10.3)
Chloride: 97 mmol/L — ABNORMAL LOW (ref 98–111)
Creatinine, Ser: 2.03 mg/dL — ABNORMAL HIGH (ref 0.61–1.24)
GFR, Estimated: 41 mL/min — ABNORMAL LOW (ref >60)
Glucose, Bld: 101 mg/dL — ABNORMAL HIGH (ref 70–99)
Potassium: 3.8 mmol/L (ref 3.5–5.1)
Sodium: 136 mmol/L (ref 135–145)

## 2023-05-03 LAB — BRAIN NATRIURETIC PEPTIDE: B Natriuretic Peptide: 1338.6 pg/mL — ABNORMAL HIGH (ref 0.0–100.0)

## 2023-05-03 LAB — PROTIME-INR
INR: 3.1 — ABNORMAL HIGH (ref 0.8–1.2)
Prothrombin Time: 32.5 seconds — ABNORMAL HIGH (ref 11.4–15.2)

## 2023-05-03 LAB — HIV ANTIBODY (ROUTINE TESTING W REFLEX): HIV Screen 4th Generation wRfx: NONREACTIVE

## 2023-05-03 MED ORDER — PERFLUTREN LIPID MICROSPHERE
1.0000 mL | INTRAVENOUS | Status: AC | PRN
  Administered 2023-05-03: 6 mL via INTRAVENOUS

## 2023-05-03 MED ORDER — FUROSEMIDE 10 MG/ML IJ SOLN
100.0000 mg | Freq: Two times a day (BID) | INTRAVENOUS | Status: DC
  Administered 2023-05-03 – 2023-05-05 (×5): 100 mg via INTRAVENOUS
  Filled 2023-05-03 (×7): qty 10

## 2023-05-03 MED ORDER — METOLAZONE 5 MG PO TABS
5.0000 mg | ORAL_TABLET | Freq: Once | ORAL | Status: AC
  Administered 2023-05-03: 5 mg via ORAL
  Filled 2023-05-03: qty 1

## 2023-05-03 NOTE — Progress Notes (Signed)
  Echocardiogram 2D Echocardiogram has been performed.  Maren Reamer 05/03/2023, 4:06 PM

## 2023-05-03 NOTE — Progress Notes (Signed)
   05/02/23 2324  BiPAP/CPAP/SIPAP  $ Non-Invasive Ventilator  Non-Invasive Vent Initial  $ Face Mask Large  Yes  BiPAP/CPAP/SIPAP Pt Type Adult  BiPAP/CPAP/SIPAP DREAMSTATIOND  Mask Type Full face mask  Mask Size Extra large  PEEP 6 cmH20  FiO2 (%) 21 %

## 2023-05-03 NOTE — Progress Notes (Signed)
Progress Note  Patient Name: Mark Baker Date of Encounter: 05/03/2023  Primary Cardiologist: None   Subjective   Feels ok. Notices increased fluid -- swelling in arms, abdomen, legs.  Inpatient Medications    Scheduled Meds:  amiodarone  200 mg Oral Daily   atorvastatin  40 mg Oral Daily   carvedilol  12.5 mg Oral BID WC   dapagliflozin propanediol  10 mg Oral Daily   isosorbide mononitrate  60 mg Oral Daily   losartan  100 mg Oral Daily   sodium chloride flush  3 mL Intravenous Q12H   spironolactone  12.5 mg Oral Daily   warfarin  4 mg Oral QPM   Warfarin - Physician Dosing Inpatient   Does not apply q1600   Continuous Infusions:  sodium chloride     PRN Meds: sodium chloride, acetaminophen, sodium chloride flush   Vital Signs    Vitals:   05/03/23 0746 05/03/23 1127 05/03/23 1238 05/03/23 1240  BP: (!) 130/114 92/61 (!) 114/92   Pulse: 73 76 72   Resp: 20 20 18    Temp: 97.7 F (36.5 C) 97.9 F (36.6 C)    TempSrc: Oral Oral    SpO2: 95% 94%    Weight:    (!) 203.3 kg    Intake/Output Summary (Last 24 hours) at 05/03/2023 1346 Last data filed at 05/03/2023 0749 Gross per 24 hour  Intake 240 ml  Output 1150 ml  Net -910 ml   Filed Weights   05/02/23 2125 05/03/23 0420 05/03/23 1240  Weight: (!) 203 kg (!) 203 kg (!) 203.3 kg    Telemetry    Sinus rhythm with occasional PACs - Personally Reviewed  ECG    Sinus rhythm with LBBB - Personally Reviewed  Physical Exam  GEN: No acute distress. Napping on my arrival, using CPAP Neck: Unable to appreciate JVD Cardiac: RRR, no murmurs, rubs, or gallops.  Respiratory: Clear to auscultation bilaterally. GI: Soft, nontender, non-distended  MS: 2+ edema; No deformity. Neuro:  Nonfocal  Psych: Normal affect   Labs    Chemistry Recent Labs  Lab 05/02/23 0838 05/03/23 0259  NA 137 136  K 3.4* 3.8  CL 96* 97*  CO2 28 28  GLUCOSE 125* 101*  BUN 32* 33*  CREATININE 1.88* 2.03*  CALCIUM 8.7* 8.6*   PROT 7.0  --   ALBUMIN 3.6  --   AST 25  --   ALT 32  --   ALKPHOS 46  --   BILITOT 2.9*  --   GFRNONAA 45* 41*  ANIONGAP 13 11     Hematology Recent Labs  Lab 05/02/23 0838 05/03/23 0259  WBC 7.5 7.0  RBC 4.78 4.88  HGB 13.4 13.4  HCT 44.2 44.8  MCV 92.5 91.8  MCH 28.0 27.5  MCHC 30.3 29.9*  RDW 13.4 13.6  PLT 256 250    Cardiac EnzymesNo results for input(s): "TROPONINI" in the last 168 hours. No results for input(s): "TROPIPOC" in the last 168 hours.   BNP Recent Labs  Lab 05/02/23 0838 05/03/23 0259  BNP 1,274.3* 1,338.6*     DDimer No results for input(s): "DDIMER" in the last 168 hours.   Summary of Pertinent studies    TTE: Ordered  Cardiac cath: Last 2018, reviewed in epic  Imaging: CXR May 02, 2023, cardiomegaly and moderate pulmonary interstitial edema  Labs: BNP 1338.6.  High-sensitivity troponin max 57.  Creatinine 2.03  Patient Profile     42 y.o. 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   Assessment & Plan    Acute on chronic systolic heart failure Nonischemic cardiomyopathy Reported 30 pound weight gain in the past month; BNP elevated for him at 1274 Repeat TTE pending --prior EF 20 to 25% Continue carvedilol, Imdur, farxiga, losartan, spironolactone  Modest uop. Will give metolazone today, schedule furosemide  Paroxysmal atrial fibrillation On digoxin as outpatient, held due to AKI Continue warfarin; INR 2.9  Acute on chronic kidney disease Monitor creatinine with diuresis  Hypertension BP currently controlled on CHF regimen  Obstructive sleep apnea Using CPAP  Morbid obesity Reports he is following a low carb, low cal diet    For questions or updates, please contact [facility] Please consult [email or web address] for contact info under Cardiology/STEMI.      Signed, Maurice Small, MD 05/03/2023, 1:46 PM

## 2023-05-04 LAB — BASIC METABOLIC PANEL
Anion gap: 13 (ref 5–15)
BUN: 37 mg/dL — ABNORMAL HIGH (ref 6–20)
CO2: 31 mmol/L (ref 22–32)
Calcium: 9.2 mg/dL (ref 8.9–10.3)
Chloride: 93 mmol/L — ABNORMAL LOW (ref 98–111)
Creatinine, Ser: 1.91 mg/dL — ABNORMAL HIGH (ref 0.61–1.24)
GFR, Estimated: 44 mL/min — ABNORMAL LOW (ref >60)
Glucose, Bld: 109 mg/dL — ABNORMAL HIGH (ref 70–99)
Potassium: 3.9 mmol/L (ref 3.5–5.1)
Sodium: 137 mmol/L (ref 135–145)

## 2023-05-04 LAB — MAGNESIUM: Magnesium: 2.3 mg/dL (ref 1.7–2.4)

## 2023-05-04 LAB — PROTIME-INR
INR: 2.7 — ABNORMAL HIGH (ref 0.8–1.2)
Prothrombin Time: 29.3 seconds — ABNORMAL HIGH (ref 11.4–15.2)

## 2023-05-04 NOTE — Plan of Care (Signed)

## 2023-05-04 NOTE — Progress Notes (Addendum)
Progress Note  Patient Name: Mark Baker Date of Encounter: 05/04/2023  Primary Cardiologist: None   Subjective   Feels ok. Notices increased fluid -- swelling in arms, abdomen, legs. Good urine output yesterday and overnight. Feeling better.  Inpatient Medications    Scheduled Meds:  amiodarone  200 mg Oral Daily   atorvastatin  40 mg Oral Daily   carvedilol  12.5 mg Oral BID WC   dapagliflozin propanediol  10 mg Oral Daily   isosorbide mononitrate  60 mg Oral Daily   losartan  100 mg Oral Daily   sodium chloride flush  3 mL Intravenous Q12H   spironolactone  12.5 mg Oral Daily   warfarin  4 mg Oral QPM   Warfarin - Physician Dosing Inpatient   Does not apply q1600   Continuous Infusions:  sodium chloride     furosemide 100 mg (05/04/23 0828)   PRN Meds: sodium chloride, acetaminophen, sodium chloride flush   Vital Signs    Vitals:   05/03/23 2013 05/04/23 0100 05/04/23 0558 05/04/23 0800  BP: (!) 110/97  (!) 91/55 90/60  Pulse: 70  68 63  Resp:  18 18 20   Temp: 98.2 F (36.8 C) 98.4 F (36.9 C) 98 F (36.7 C)   TempSrc: Oral Oral Oral   SpO2: 96%  98% 91%  Weight:   (!) 200 kg     Intake/Output Summary (Last 24 hours) at 05/04/2023 1133 Last data filed at 05/04/2023 1019 Gross per 24 hour  Intake 420 ml  Output 5000 ml  Net -4580 ml   Filed Weights   05/03/23 0420 05/03/23 1240 05/04/23 0558  Weight: (!) 203 kg (!) 203.3 kg (!) 200 kg    Telemetry    Sinus rhythm with occasional PACs - Personally Reviewed  ECG    Sinus rhythm with LBBB - Personally Reviewed  Physical Exam  GEN: No acute distress. Napping on my arrival, using CPAP Neck: Unable to appreciate JVD Cardiac: RRR, no murmurs, rubs, or gallops.  Respiratory: Clear to auscultation bilaterally. GI: Soft, nontender, non-distended  MS: 2+ edema; No deformity. Neuro:  Nonfocal  Psych: Normal affect   Labs    Chemistry Recent Labs  Lab 05/02/23 0838 05/03/23 0259  NA 137 136   K 3.4* 3.8  CL 96* 97*  CO2 28 28  GLUCOSE 125* 101*  BUN 32* 33*  CREATININE 1.88* 2.03*  CALCIUM 8.7* 8.6*  PROT 7.0  --   ALBUMIN 3.6  --   AST 25  --   ALT 32  --   ALKPHOS 46  --   BILITOT 2.9*  --   GFRNONAA 45* 41*  ANIONGAP 13 11     Hematology Recent Labs  Lab 05/02/23 0838 05/03/23 0259  WBC 7.5 7.0  RBC 4.78 4.88  HGB 13.4 13.4  HCT 44.2 44.8  MCV 92.5 91.8  MCH 28.0 27.5  MCHC 30.3 29.9*  RDW 13.4 13.6  PLT 256 250    Cardiac EnzymesNo results for input(s): "TROPONINI" in the last 168 hours. No results for input(s): "TROPIPOC" in the last 168 hours.   BNP Recent Labs  Lab 05/02/23 0838 05/03/23 0259  BNP 1,274.3* 1,338.6*     DDimer No results for input(s): "DDIMER" in the last 168 hours.   Summary of Pertinent studies    TTE: Ordered  Cardiac cath: Last 2018, reviewed in epic  Imaging: CXR May 02, 2023, cardiomegaly and moderate pulmonary interstitial edema  Labs: BNP 1338.6.  High-sensitivity  troponin max 57.  Creatinine 2.03  Patient Profile     42 y.o. 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   Assessment & Plan    Acute on chronic systolic heart failure Nonischemic cardiomyopathy Reported 30 pound weight gain in the past month; BNP elevated for him at 1274 Repeat TTE pending --prior EF 20 to 25% Continue carvedilol, Imdur, farxiga, losartan, spironolactone  UOP increased after metolazone, increased IV lasix -- will continue IV lasix 100 today BMP is pending today  Paroxysmal atrial fibrillation On digoxin as outpatient, held due to AKI Continue warfarin; INR 2.9  Acute on chronic kidney disease Monitor creatinine with diuresis  Hypertension BP currently controlled on CHF  regimen  Obstructive sleep apnea Using CPAP  Morbid obesity Reports he is following a low carb, low cal diet    For questions or updates, please contact [facility] Please consult [email or web address] for contact info under Cardiology/STEMI.      Signed, Maurice Small, MD 05/04/2023, 11:33 AM

## 2023-05-04 NOTE — Plan of Care (Signed)
Problem: Clinical Measurements: Goal: Respiratory complications will improve Outcome: Progressing   Problem: Activity: Goal: Capacity to carry out activities will improve Outcome: Progressing

## 2023-05-05 DIAGNOSIS — I5021 Acute systolic (congestive) heart failure: Secondary | ICD-10-CM

## 2023-05-05 DIAGNOSIS — I10 Essential (primary) hypertension: Secondary | ICD-10-CM

## 2023-05-05 DIAGNOSIS — N1832 Chronic kidney disease, stage 3b: Secondary | ICD-10-CM

## 2023-05-05 LAB — PROTIME-INR
INR: 2.9 — ABNORMAL HIGH (ref 0.8–1.2)
Prothrombin Time: 30.4 s — ABNORMAL HIGH (ref 11.4–15.2)

## 2023-05-05 LAB — BASIC METABOLIC PANEL
Anion gap: 10 (ref 5–15)
BUN: 39 mg/dL — ABNORMAL HIGH (ref 6–20)
CO2: 36 mmol/L — ABNORMAL HIGH (ref 22–32)
Calcium: 8.9 mg/dL (ref 8.9–10.3)
Chloride: 89 mmol/L — ABNORMAL LOW (ref 98–111)
Creatinine, Ser: 2.05 mg/dL — ABNORMAL HIGH (ref 0.61–1.24)
GFR, Estimated: 41 mL/min — ABNORMAL LOW (ref >60)
Glucose, Bld: 88 mg/dL (ref 70–99)
Potassium: 3.2 mmol/L — ABNORMAL LOW (ref 3.5–5.1)
Sodium: 135 mmol/L (ref 135–145)

## 2023-05-05 MED ORDER — SPIRONOLACTONE 25 MG PO TABS
25.0000 mg | ORAL_TABLET | Freq: Every day | ORAL | Status: DC
  Administered 2023-05-06 – 2023-05-07 (×2): 25 mg via ORAL
  Filled 2023-05-05 (×2): qty 1

## 2023-05-05 MED ORDER — POTASSIUM CHLORIDE CRYS ER 20 MEQ PO TBCR
40.0000 meq | EXTENDED_RELEASE_TABLET | Freq: Three times a day (TID) | ORAL | Status: DC
  Administered 2023-05-05 – 2023-05-07 (×6): 40 meq via ORAL
  Filled 2023-05-05 (×6): qty 2

## 2023-05-05 MED ORDER — POTASSIUM CHLORIDE CRYS ER 20 MEQ PO TBCR
40.0000 meq | EXTENDED_RELEASE_TABLET | Freq: Two times a day (BID) | ORAL | Status: DC
  Administered 2023-05-05: 40 meq via ORAL
  Filled 2023-05-05: qty 2

## 2023-05-05 NOTE — Progress Notes (Signed)
Pt can place self on/off. Set @ 11cmH2O per pt stated home settings   05/04/23 2300  BiPAP/CPAP/SIPAP  $ Non-Invasive Home Ventilator  Subsequent  $ Face Mask Large  Yes  $ Face Mask Medium Yes  BiPAP/CPAP/SIPAP Pt Type Adult  BiPAP/CPAP/SIPAP DREAMSTATIOND  Mask Type Nasal mask  Mask Size Large  EPAP 11 cmH2O  FiO2 (%) 21 %

## 2023-05-05 NOTE — Progress Notes (Signed)
  Progress Note   Date: 05/05/2023  Patient Name: Mark Baker        MRN#: [id number]  Review the patient's clinical findings supports the diagnosis of:   CKD Stage 3b       Gerri Spore T. Flora Lipps, MD, [facility]  [facility]  [address], [address] [place], [place] [address] [phone]  1:04 PM

## 2023-05-05 NOTE — Progress Notes (Signed)
Cardiology Progress Note  Patient ID: Mark Baker MRN: [id number] DOB: [birth year] Date of Encounter: 05/05/2023  Primary Cardiologist: None  Subjective   Chief Complaint: SOB  HPI: Good diuresis.  Still volume up.  Still short of breath.  Not back to baseline.  ROS:  All other ROS reviewed and negative. Pertinent positives noted in the HPI.     Inpatient Medications  Scheduled Meds:  amiodarone  200 mg Oral Daily   atorvastatin  40 mg Oral Daily   carvedilol  12.5 mg Oral BID WC   dapagliflozin propanediol  10 mg Oral Daily   isosorbide mononitrate  60 mg Oral Daily   losartan  100 mg Oral Daily   potassium chloride  40 mEq Oral BID   sodium chloride flush  3 mL Intravenous Q12H   [START ON 05/06/2023] spironolactone  25 mg Oral Daily   warfarin  4 mg Oral QPM   Warfarin - Physician Dosing Inpatient   Does not apply q1600   Continuous Infusions:  sodium chloride     furosemide 100 mg (05/04/23 1752)   PRN Meds: sodium chloride, acetaminophen, sodium chloride flush   Vital Signs   Vitals:   05/05/23 0015 05/05/23 0441 05/05/23 0750 05/05/23 0824  BP: (!) 129/97 107/64 102/88 113/79  Pulse: 74 61 65 71  Resp: 17 17 18 16   Temp: (!) 97.1 F (36.2 C) (!) 97.1 F (36.2 C) 97.7 F (36.5 C)   TempSrc: Oral Oral Oral   SpO2: 96% 96% 96% 96%  Weight:  (!) 195.5 kg      Intake/Output Summary (Last 24 hours) at 05/05/2023 1001 Last data filed at 05/05/2023 0801 Gross per 24 hour  Intake 820 ml  Output 5520 ml  Net -4700 ml      05/05/2023    4:41 AM 05/04/2023    5:58 AM 05/03/2023   12:40 PM  Last 3 Weights  Weight (lbs) 431 lb 440 lb 14.7 oz 448 lb 3.1 oz  Weight (kg) 195.5 kg 200 kg 203.3 kg      Telemetry  Overnight telemetry shows sinus rhythm 70s, which I personally reviewed.   Physical Exam   Vitals:   05/05/23 0015 05/05/23 0441 05/05/23 0750 05/05/23 0824  BP: (!) 129/97 107/64 102/88 113/79  Pulse: 74 61 65 71  Resp: 17 17 18 16   Temp: (!)  97.1 F (36.2 C) (!) 97.1 F (36.2 C) 97.7 F (36.5 C)   TempSrc: Oral Oral Oral   SpO2: 96% 96% 96% 96%  Weight:  (!) 195.5 kg      Intake/Output Summary (Last 24 hours) at 05/05/2023 1001 Last data filed at 05/05/2023 0801 Gross per 24 hour  Intake 820 ml  Output 5520 ml  Net -4700 ml       05/05/2023    4:41 AM 05/04/2023    5:58 AM 05/03/2023   12:40 PM  Last 3 Weights  Weight (lbs) 431 lb 440 lb 14.7 oz 448 lb 3.1 oz  Weight (kg) 195.5 kg 200 kg 203.3 kg    Body mass index is 55.34 kg/m.  General: Well nourished, well developed, in no acute distress Head: Atraumatic, normal size  Eyes: PEERLA, EOMI  Neck: Supple, JVD 10 to 12 cm of water Endocrine: No thryomegaly Cardiac: Normal S1, S2; RRR; no murmurs, rubs, or gallops Lungs: Diminished breath sounds bilaterally Abd: Soft, nontender, no hepatomegaly  Ext: Trace edema Musculoskeletal: No deformities, BUE and BLE strength normal and equal Skin: Warm and  dry, no rashes   Neuro: Alert and oriented to person, place, time, and situation, CNII-XII grossly intact, no focal deficits  Psych: Normal mood and affect   Labs  High Sensitivity Troponin:   Recent Labs  Lab 05/02/23 0838 05/02/23 1019  TROPONINIHS 57* 48*     Cardiac EnzymesNo results for input(s): "TROPONINI" in the last 168 hours. No results for input(s): "TROPIPOC" in the last 168 hours.  Chemistry Recent Labs  Lab 05/02/23 0838 05/03/23 0259 05/04/23 1243 05/05/23 0246  NA 137 136 137 135  K 3.4* 3.8 3.9 3.2*  CL 96* 97* 93* 89*  CO2 28 28 31  36*  GLUCOSE 125* 101* 109* 88  BUN 32* 33* 37* 39*  CREATININE 1.88* 2.03* 1.91* 2.05*  CALCIUM 8.7* 8.6* 9.2 8.9  PROT 7.0  --   --   --   ALBUMIN 3.6  --   --   --   AST 25  --   --   --   ALT 32  --   --   --   ALKPHOS 46  --   --   --   BILITOT 2.9*  --   --   --   GFRNONAA 45* 41* 44* 41*  ANIONGAP 13 11 13 10     Hematology Recent Labs  Lab 05/02/23 0838 05/03/23 0259  WBC 7.5 7.0  RBC 4.78  4.88  HGB 13.4 13.4  HCT 44.2 44.8  MCV 92.5 91.8  MCH 28.0 27.5  MCHC 30.3 29.9*  RDW 13.4 13.6  PLT 256 250   BNP Recent Labs  Lab 05/02/23 0838 05/03/23 0259  BNP 1,274.3* 1,338.6*    DDimer No results for input(s): "DDIMER" in the last 168 hours.   Radiology  ECHOCARDIOGRAM COMPLETE  Result Date: 05/03/2023    ECHOCARDIOGRAM REPORT   Patient Name:   Daron Savona Date of Exam: 05/03/2023 Medical Rec #:  [id number]   Height:       74.0 in Accession #:    [id number]  Weight:       448.2 lb Date of Birth:  12-Sep-1980  BSA:          3.063 m Patient Age:    41 years    BP:           114/92 mmHg Patient Gender: M           HR:           71 bpm. Exam Location:  Inpatient Procedure: 2D Echo, Cardiac Doppler, Color Doppler and Intracardiac            Opacification Agent Indications:    CHF-Acute Systolic I50.21  History:        Patient has prior history of Echocardiogram examinations, most                 recent 11/26/2017. CHF and Cardiomyopathy; Risk                 Factors:Hypertension, Sleep Apnea, Dyslipidemia and Non-Smoker.  Sonographer:    Aron Baba Referring Phys: [id number] DENNIS NARCISSE JR  Sonographer Comments: Technically difficult study due to poor echo windows. IMPRESSIONS  1. Left ventricular ejection fraction, by estimation, is 20 to 25%. The left ventricle has severely decreased function. The left ventricle demonstrates global hypokinesis. The left ventricular internal cavity size was severely dilated. There is moderate  left ventricular hypertrophy. Left ventricular diastolic parameters are consistent with Grade II diastolic dysfunction (pseudonormalization).  2. Right ventricular systolic  function is normal. The right ventricular size is normal. There is moderately elevated pulmonary artery systolic pressure. The estimated right ventricular systolic pressure is 46.4 mmHg.  3. Left atrial size was severely dilated.  4. Right atrial size was severely dilated.  5. The mitral valve is  normal in structure. Moderate mitral valve regurgitation. No evidence of mitral stenosis.  6. The aortic valve is normal in structure. Aortic valve regurgitation is not visualized. No aortic stenosis is present.  7. Aortic dilatation noted. There is mild dilatation of the aortic root, measuring 40 mm.  8. The inferior vena cava is dilated in size with <50% respiratory variability, suggesting right atrial pressure of 15 mmHg. FINDINGS  Left Ventricle: Left ventricular ejection fraction, by estimation, is 20 to 25%. The left ventricle has severely decreased function. The left ventricle demonstrates global hypokinesis. Definity contrast agent was given IV to delineate the left ventricular endocardial borders. The left ventricular internal cavity size was severely dilated. There is moderate left ventricular hypertrophy. Left ventricular diastolic parameters are consistent with Grade II diastolic dysfunction (pseudonormalization). Right Ventricle: The right ventricular size is normal. No increase in right ventricular wall thickness. Right ventricular systolic function is normal. There is moderately elevated pulmonary artery systolic pressure. The tricuspid regurgitant velocity is 2.80 m/s, and with an assumed right atrial pressure of 15 mmHg, the estimated right ventricular systolic pressure is 46.4 mmHg. Left Atrium: Left atrial size was severely dilated. Right Atrium: Right atrial size was severely dilated. Pericardium: There is no evidence of pericardial effusion. Mitral Valve: The mitral valve is normal in structure. Moderate mitral valve regurgitation. No evidence of mitral valve stenosis. Tricuspid Valve: The tricuspid valve is normal in structure. Tricuspid valve regurgitation is mild . No evidence of tricuspid stenosis. Aortic Valve: The aortic valve is normal in structure. Aortic valve regurgitation is not visualized. No aortic stenosis is present. Pulmonic Valve: The pulmonic valve was normal in structure.  Pulmonic valve regurgitation is mild. No evidence of pulmonic stenosis. Aorta: Aortic dilatation noted. There is mild dilatation of the aortic root, measuring 40 mm. Venous: The inferior vena cava is dilated in size with less than 50% respiratory variability, suggesting right atrial pressure of 15 mmHg. IAS/Shunts: No atrial level shunt detected by color flow Doppler.  LEFT VENTRICLE PLAX 2D LVIDd:         7.70 cm   Diastology LVIDs:         6.50 cm   LV e' medial:    6.31 cm/s LV PW:         1.60 cm   LV E/e' medial:  20.0 LV IVS:        0.90 cm   LV e' lateral:   6.22 cm/s LVOT diam:     2.10 cm   LV E/e' lateral: 20.3 LV SV:         43 LV SV Index:   14 LVOT Area:     3.46 cm  RIGHT VENTRICLE RV S prime:     12.70 cm/s TAPSE (M-mode): 2.1 cm LEFT ATRIUM            Index        RIGHT ATRIUM           Index LA diam:      7.10 cm  2.32 cm/m   RA Area:     37.40 cm LA Vol (A4C): 240.0 ml 78.36 ml/m  RA Volume:   145.00 ml 47.35 ml/m  AORTIC VALVE  PULMONIC VALVE LVOT Vmax:   72.10 cm/s  PR End Diast Vel: 16.89 msec LVOT Vmean:  44.500 cm/s LVOT VTI:    0.124 m  AORTA Ao Root diam: 4.00 cm Ao Asc diam:  3.40 cm MITRAL VALVE                 TRICUSPID VALVE MV Area (PHT): 7.99 cm      TR Peak grad:   31.4 mmHg MV Decel Time: 95 msec       TR Vmax:        280.00 cm/s MR Peak grad:   68.2 mmHg MR Mean grad:   37.0 mmHg    SHUNTS MR Vmax:        413.00 cm/s  Systemic VTI:  0.12 m MR Vmean:       284.0 cm/s   Systemic Diam: 2.10 cm MR PISA:        1.01 cm MR PISA Radius: 0.40 cm MV E velocity: 126.00 cm/s MV A velocity: 36.50 cm/s MV E/A ratio:  3.45 Donato Schultz MD Electronically signed by Donato Schultz MD Signature Date/Time: 05/03/2023/4:19:47 PM    Final     Cardiac Studies  TTE 05/03/2023  1. Left ventricular ejection fraction, by estimation, is 20 to 25%. The  left ventricle has severely decreased function. The left ventricle  demonstrates global hypokinesis. The left ventricular internal cavity  size  was severely dilated. There is moderate   left ventricular hypertrophy. Left ventricular diastolic parameters are  consistent with Grade II diastolic dysfunction (pseudonormalization).   2. Right ventricular systolic function is normal. The right ventricular  size is normal. There is moderately elevated pulmonary artery systolic  pressure. The estimated right ventricular systolic pressure is 46.4 mmHg.   3. Left atrial size was severely dilated.   4. Right atrial size was severely dilated.   5. The mitral valve is normal in structure. Moderate mitral valve  regurgitation. No evidence of mitral stenosis.   6. The aortic valve is normal in structure. Aortic valve regurgitation is  not visualized. No aortic stenosis is present.   7. Aortic dilatation noted. There is mild dilatation of the aortic root,  measuring 40 mm.   8. The inferior vena cava is dilated in size with <50% respiratory  variability, suggesting right atrial pressure of 15 mmHg.   LHC/RHC 03/18/2017 Assessment: 1. Minimal non-obstructive CAD 2. Mild pulmonary HTN 3. NICM with EF ~25-30% though hard to assess EF on LV gram due to poor opacification  Patient Profile  Kristy Nygren is a 42 y.o. male with systolic heart failure, CKD stage IIIb, paroxysmal atrial fibrillation on Coumadin, hypertension, OSA admitted on 05/10/2023 for acute on chronic systolic heart failure.  Assessment & Plan   # Acute on chronic systolic heart failure, EF 20-25% # Nonischemic cardiomyopathy # Morbid obesity # Medication noncompliance -Net -4.1 L overnight.  Still volume overloaded.  Continue with Lasix 100 mg IV twice daily. -Potassium low.  Increase potassium to 40 mEq 3 times daily.  Recheck BMP at 1400 today. -Current regimen includes carvedilol 12.5 mg twice daily, Farxiga 10 mg daily, losartan 100 mg daily, Aldactone 25 mg daily.  Continue current regimen.  Intolerance to [facility] noted.  # CKD stage IIIb -Kidney function  stable.  # Paroxysmal atrial fibrillation -On Coumadin at home.  Continue this here. -Was on digoxin at home.  Stop this.  No need for this. -On amiodarone 200 mg daily for rhythm maintenance.  # OSA -CPAP  #  Hypertension -Carvedilol, losartan, Aldactone, Imdur 60.  # Morbid obesity -Weight loss recommended  FEN -No intravenous fluids -Diet: Heart healthy -DVT PPx: Warfarin -Code: Full -Disposition: Anticipate 1 to 2 days more of IV diuresis     For questions or updates, please contact [facility] Please consult [email or web address] for contact info under        Signed, Gerri Spore T. Flora Lipps, MD, [facility]   [facility]  05/05/2023 10:01 AM

## 2023-05-05 NOTE — Progress Notes (Signed)
Heart Failure Navigator Progress Note  Assessed for Heart & Vascular TOC clinic readiness.  Patient does not meet criteria due to Advanced Heart Failure Team patient of Dr. Gala Romney.   Navigator will sign off at this time.   Rhae Hammock, BSN, Scientist, clinical (histocompatibility and immunogenetics) Only

## 2023-05-05 NOTE — Progress Notes (Signed)
  Progress Note   Date: 05/05/2023  Patient Name: Mark Baker        MRN#: [id number]  Hypokalemia - Potassium was given for hypokalemia  [name]. Flora Lipps, MD, [facility]  [facility]  [address], [address] [place], [place] [address] [phone]  1:04 PM

## 2023-05-05 NOTE — Plan of Care (Signed)
Problem: Health Behavior/Discharge Planning: Goal: Ability to manage health-related needs will improve Outcome: Progressing   Problem: Clinical Measurements: Goal: Ability to maintain clinical measurements within normal limits will improve Outcome: Progressing Goal: Will remain free from infection Outcome: Progressing

## 2023-05-06 DIAGNOSIS — E876 Hypokalemia: Secondary | ICD-10-CM

## 2023-05-06 DIAGNOSIS — G4733 Obstructive sleep apnea (adult) (pediatric): Secondary | ICD-10-CM

## 2023-05-06 LAB — BASIC METABOLIC PANEL
Anion gap: 12 (ref 5–15)
BUN: 39 mg/dL — ABNORMAL HIGH (ref 6–20)
CO2: 32 mmol/L (ref 22–32)
Calcium: 8.9 mg/dL (ref 8.9–10.3)
Chloride: 93 mmol/L — ABNORMAL LOW (ref 98–111)
Creatinine, Ser: 2.03 mg/dL — ABNORMAL HIGH (ref 0.61–1.24)
GFR, Estimated: 41 mL/min — ABNORMAL LOW (ref >60)
Glucose, Bld: 86 mg/dL (ref 70–99)
Potassium: 3.9 mmol/L (ref 3.5–5.1)
Sodium: 137 mmol/L (ref 135–145)

## 2023-05-06 LAB — PROTIME-INR
INR: 3 — ABNORMAL HIGH (ref 0.8–1.2)
Prothrombin Time: 31.3 seconds — ABNORMAL HIGH (ref 11.4–15.2)

## 2023-05-06 MED ORDER — TORSEMIDE 20 MG PO TABS
40.0000 mg | ORAL_TABLET | Freq: Every day | ORAL | Status: DC
  Administered 2023-05-06: 40 mg via ORAL
  Filled 2023-05-06 (×2): qty 2

## 2023-05-06 NOTE — TOC Initial Note (Signed)
Transition of Care [facility]) - Initial/Assessment Note    Patient Details  Name: Mark Baker MRN: [id number] Date of Birth: Dec 12, 1980  Transition of Care [facility] CM/SW Contact:    Leone Haven, RN Phone Number: 05/06/2023, 3:37 PM  Clinical Narrative:                 From home alone, has PCP , Dr. Lowell Guitar with [facility] and insurance [employer] it is not  on file, he says it is in his car, states has no HH services in place at this time , he has a cane.  States he  will  have transport  home at [employer] and family is support system, states [facility] .  Pta self ambulatory.   Expected Discharge Plan: Home/Self Care Barriers to Discharge: No Barriers Identified   Patient Goals and CMS Choice Patient states their goals for this hospitalization and ongoing recovery are:: return home   Choice offered to / list presented to : NA      Expected Discharge Plan and Services In-house Referral: NA Discharge Planning Services: CM Consult Post Acute Care Choice: NA Living arrangements for the past 2 months: Single Family Home                 DME Arranged: N/A DME Agency: NA       HH Arranged: NA          Prior Living Arrangements/Services Living arrangements for the past 2 months: Single Family Home Lives with:: Self Patient language and need for interpreter reviewed:: Yes Do you feel safe going back to the place where you live?: Yes      Need for Family Participation in Patient Care: No (Comment) Care giver support system in place?: No (comment)   Criminal Activity/Legal Involvement Pertinent to Current Situation/Hospitalization: No - Comment as needed  Activities of Daily Living Home Assistive Devices/Equipment: Cane (specify quad or straight) ADL Screening (condition at time of admission) Is the patient deaf or have difficulty hearing?: No Does the patient have difficulty seeing, even when wearing glasses/contacts?: No Does the patient have difficulty  concentrating, remembering, or making decisions?: No  Permission Sought/Granted Permission sought to share information with : Case Manager Permission granted to share information with : Yes, Verbal Permission Granted              Emotional Assessment   Attitude/Demeanor/Rapport: Engaged Affect (typically observed): Appropriate Orientation: : Oriented to Self, Oriented to Place, Oriented to  Time, Oriented to Situation Alcohol / Substance Use: Not Applicable Psych Involvement: No (comment)  Admission diagnosis:  Orthopnea [R06.01] Acute exacerbation of CHF (congestive heart failure) (HCC) [I50.9] Acute on chronic combined systolic and diastolic congestive heart failure (HCC) [I50.43] Acute combined systolic and diastolic heart failure (HCC) [I50.41] Patient Active Problem List   Diagnosis Date Noted   Long term (current) use of anticoagulants 05/02/2023   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   Acute exacerbation of CHF (congestive heart failure) (HCC) 05/02/2023   Acute systolic heart failure (HCC) 05/02/2023   Acute blood loss anemia    CHF (congestive heart failure) (HCC)    Congestion of upper respiratory tract    Debility 07/30/2018   AKI (acute kidney injury) (HCC)    Supplemental oxygen dependent    PAF (paroxysmal atrial fibrillation) (HCC)    Benign essential HTN    Acute respiratory failure with hypoxia and hypercapnia (HCC) 07/25/2018   Infection of right knee (HCC) 07/23/2018   Rupture of  right quadriceps muscle 06/10/2018   Quadriceps tendon rupture, right, initial encounter 06/10/2018   Vitamin D deficiency 12/10/2017   Volume overload 11/25/2017   Syncope 01/01/2017   Erectile dysfunction 12/25/2016   Paroxysmal atrial fibrillation (HCC)    Chronic HFrEF (heart failure with reduced ejection fraction) (HCC) 07/05/2016   Morbid obesity with body mass index (BMI) of 60.0 to 69.9 in adult (HCC) 07/05/2016   CKD stage 3b, GFR 30-44 ml/min (HCC)  07/05/2016   Hyperlipidemia 11/25/2012   Prolonged QT interval 11/24/2012   Healthcare maintenance 09/03/2012   HTN (hypertension) 02/19/2012   Sleep apnea 02/10/2012   Morbid obesity (HCC) 02/10/2012   PCP:  Sherren Mocha, MD Pharmacy:   [phone] [place] [place] - [address] AT [address] [place] [address] Phone: [phone] Fax: [phone]  [id number] [name], [place] - [address] AT [facility] [address] [place] [address] Phone: [phone] Fax: [phone]  [id number] [place], [place] - [address] AT [address]OSS [address] [place] [address] Phone: [phone] Fax: [phone]     Social Determinants of Health (SDOH) Social History: SDOH Screenings   Food Insecurity: Unknown (05/02/2023)  Housing: Low Risk  (05/02/2023)  Transportation Needs: No Transportation Needs (05/02/2023)  Utilities: Not At Risk (05/02/2023)  Financial Resource Strain: Low Risk  (01/14/2022)   [facility]  Physical Activity: Sufficiently Active (01/14/2022)   [facility]  Social Connections: Feeling Socially Integrated (02/26/2022)   [facility]  Tobacco Use: Low Risk  (05/02/2023)  Health Literacy: Adequate Health Literacy (02/26/2022)   [facility]   SDOH Interventions:     Readmission Risk Interventions     No data to display

## 2023-05-06 NOTE — Progress Notes (Signed)
Cardiology Progress Note  Patient ID: Mark Baker MRN: [id number] DOB: Aug 04, 1981 Date of Encounter: 05/06/2023  Primary Cardiologist: None  Subjective   Chief Complaint: SOB/Fatigue   HPI: Net -5.7 L.  Euvolemic.  We will see how he does on oral diuretics today.  Reports fatigue and shortness of breath.  Anticipate discharge tomorrow.  ROS:  All other ROS reviewed and negative. Pertinent positives noted in the HPI.     Inpatient Medications  Scheduled Meds:  amiodarone  200 mg Oral Daily   atorvastatin  40 mg Oral Daily   carvedilol  12.5 mg Oral BID WC   dapagliflozin propanediol  10 mg Oral Daily   isosorbide mononitrate  60 mg Oral Daily   losartan  100 mg Oral Daily   potassium chloride  40 mEq Oral TID   sodium chloride flush  3 mL Intravenous Q12H   spironolactone  25 mg Oral Daily   torsemide  40 mg Oral Daily   warfarin  4 mg Oral QPM   Warfarin - Physician Dosing Inpatient   Does not apply q1600   Continuous Infusions:  sodium chloride     PRN Meds: sodium chloride, acetaminophen, sodium chloride flush   Vital Signs   Vitals:   05/06/23 0026 05/06/23 0450 05/06/23 0500 05/06/23 0823  BP: 120/84 (!) 85/54 112/86 100/69  Pulse: 82 (!) 58  68  Resp: 17 17  20   Temp: 98.1 F (36.7 C) 97.6 F (36.4 C)  97.6 F (36.4 C)  TempSrc: Oral Oral  Oral  SpO2: 100% 95%  98%  Weight:  (!) 193.1 kg    Height:        Intake/Output Summary (Last 24 hours) at 05/06/2023 0913 Last data filed at 05/06/2023 4098 Gross per 24 hour  Intake 670 ml  Output 5735 ml  Net -5065 ml      05/06/2023    4:50 AM 05/05/2023    4:41 AM 05/04/2023    5:58 AM  Last 3 Weights  Weight (lbs) 425 lb 11.3 oz 431 lb 440 lb 14.7 oz  Weight (kg) 193.1 kg 195.5 kg 200 kg      Telemetry  Overnight telemetry shows SB 40s, which I personally reviewed.    Physical Exam   Vitals:   05/06/23 0026 05/06/23 0450 05/06/23 0500 05/06/23 0823  BP: 120/84 (!) 85/54 112/86 100/69  Pulse: 82  (!) 58  68  Resp: 17 17  20   Temp: 98.1 F (36.7 C) 97.6 F (36.4 C)  97.6 F (36.4 C)  TempSrc: Oral Oral  Oral  SpO2: 100% 95%  98%  Weight:  (!) 193.1 kg    Height:        Intake/Output Summary (Last 24 hours) at 05/06/2023 0913 Last data filed at 05/06/2023 1191 Gross per 24 hour  Intake 670 ml  Output 5735 ml  Net -5065 ml       05/06/2023    4:50 AM 05/05/2023    4:41 AM 05/04/2023    5:58 AM  Last 3 Weights  Weight (lbs) 425 lb 11.3 oz 431 lb 440 lb 14.7 oz  Weight (kg) 193.1 kg 195.5 kg 200 kg    Body mass index is 54.66 kg/m.  General: Well nourished, well developed, in no acute distress Head: Atraumatic, normal size  Eyes: PEERLA, EOMI  Neck: Supple, no JVD Endocrine: No thryomegaly Cardiac: Normal S1, S2; RRR; no murmurs, rubs, or gallops Lungs: Clear to auscultation bilaterally, no wheezing, rhonchi or rales  Abd: Soft, nontender, no hepatomegaly  Ext: No edema, pulses 2+ Musculoskeletal: No deformities, BUE and BLE strength normal and equal Skin: Warm and dry, no rashes   Neuro: Alert and oriented to person, place, time, and situation, CNII-XII grossly intact, no focal deficits  Psych: Normal mood and affect   Labs  High Sensitivity Troponin:   Recent Labs  Lab 05/02/23 0838 05/02/23 1019  TROPONINIHS 57* 48*     Cardiac EnzymesNo results for input(s): "TROPONINI" in the last 168 hours. No results for input(s): "TROPIPOC" in the last 168 hours.  Chemistry Recent Labs  Lab 05/02/23 0838 05/03/23 0259 05/04/23 1243 05/05/23 0246 05/06/23 0219  NA 137   < > 137 135 137  K 3.4*   < > 3.9 3.2* 3.9  CL 96*   < > 93* 89* 93*  CO2 28   < > 31 36* 32  GLUCOSE 125*   < > 109* 88 86  BUN 32*   < > 37* 39* 39*  CREATININE 1.88*   < > 1.91* 2.05* 2.03*  CALCIUM 8.7*   < > 9.2 8.9 8.9  PROT 7.0  --   --   --   --   ALBUMIN 3.6  --   --   --   --   AST 25  --   --   --   --   ALT 32  --   --   --   --   ALKPHOS 46  --   --   --   --   BILITOT 2.9*  --    --   --   --   GFRNONAA 45*   < > 44* 41* 41*  ANIONGAP 13   < > 13 10 12    < > = values in this interval not displayed.    Hematology Recent Labs  Lab 05/02/23 0838 05/03/23 0259  WBC 7.5 7.0  RBC 4.78 4.88  HGB 13.4 13.4  HCT 44.2 44.8  MCV 92.5 91.8  MCH 28.0 27.5  MCHC 30.3 29.9*  RDW 13.4 13.6  PLT 256 250   BNP Recent Labs  Lab 05/02/23 0838 05/03/23 0259  BNP 1,274.3* 1,338.6*    DDimer No results for input(s): "DDIMER" in the last 168 hours.   Radiology  No results found.  Cardiac Studies  TTE 05/03/2023  1. Left ventricular ejection fraction, by estimation, is 20 to 25%. The  left ventricle has severely decreased function. The left ventricle  demonstrates global hypokinesis. The left ventricular internal cavity size  was severely dilated. There is moderate   left ventricular hypertrophy. Left ventricular diastolic parameters are  consistent with Grade II diastolic dysfunction (pseudonormalization).   2. Right ventricular systolic function is normal. The right ventricular  size is normal. There is moderately elevated pulmonary artery systolic  pressure. The estimated right ventricular systolic pressure is 46.4 mmHg.   3. Left atrial size was severely dilated.   4. Right atrial size was severely dilated.   5. The mitral valve is normal in structure. Moderate mitral valve  regurgitation. No evidence of mitral stenosis.   6. The aortic valve is normal in structure. Aortic valve regurgitation is  not visualized. No aortic stenosis is present.   7. Aortic dilatation noted. There is mild dilatation of the aortic root,  measuring 40 mm.   8. The inferior vena cava is dilated in size with <50% respiratory  variability, suggesting right atrial pressure of 15 mmHg.  Patient Profile  Mark Baker is a 42 y.o. male with systolic heart failure, CKD stage IIIb, paroxysmal atrial fibrillation on Coumadin, hypertension, OSA admitted on 05/10/2023 for acute on chronic  systolic heart failure.   Assessment & Plan   # Acute on chronic systolic heart failure, EF 20 to 25% # Nonischemic cardiomyopathy # Morbid obesity # Medication noncompliance -Net -5.7 L overnight.  Effectively euvolemic.  Net -14.2 L since admission.  Transition to torsemide 40 mg daily.  Anticipate discharge tomorrow. -Continue carvedilol 12.5 mg twice daily, Farxiga 10 mg daily, Imdur 60 mg daily, losartan 100 mg daily (Entresto intolerant), Aldactone 25 mg daily, torsemide 40 mg daily. -We discussed the importance of compliance.  Anticipate discharge tomorrow.  # CKD stage IIIb -Stable  # Paroxysmal A-fib -On Coumadin at home.  Was on digoxin at home.  Have stopped this.  On amiodarone 200 mg daily for rhythm maintenance.  # Hypokalemia -Secondary to diuresis.  Resolved.  # Hypertension -Controlled on current meds.  # OSA -CPAP  # FEN -No intravenous fluids -Diet: Heart healthy -DVT PPx: Warfarin -Code: Full -Disposition: Anticipate discharge tomorrow.   For questions or updates, please contact [facility] Please consult [email or web address] for contact info under        Signed, Gerri Spore T. Flora Lipps, MD, [facility] [place]  [facility]  05/06/2023 9:13 AM

## 2023-05-07 ENCOUNTER — Other Ambulatory Visit: Payer: Self-pay

## 2023-05-07 ENCOUNTER — Encounter: Payer: Self-pay | Admitting: Cardiology

## 2023-05-07 ENCOUNTER — Other Ambulatory Visit (HOSPITAL_COMMUNITY): Payer: Self-pay

## 2023-05-07 DIAGNOSIS — R0601 Orthopnea: Secondary | ICD-10-CM

## 2023-05-07 MED ORDER — ALUM & MAG HYDROXIDE-SIMETH 200-200-20 MG/5ML PO SUSP
30.0000 mL | ORAL | Status: DC | PRN
  Filled 2023-05-07: qty 30

## 2023-05-07 MED ORDER — SPIRONOLACTONE 25 MG PO TABS
25.0000 mg | ORAL_TABLET | Freq: Every day | ORAL | 3 refills | Status: AC
Start: 2023-05-07 — End: ?
  Filled 2023-05-07: qty 30, 30d supply, fill #0

## 2023-05-07 MED ORDER — FARXIGA 10 MG PO TABS
10.0000 mg | ORAL_TABLET | Freq: Every day | ORAL | 6 refills | Status: AC
  Filled 2023-05-07: qty 30, 30d supply, fill #0

## 2023-05-07 NOTE — Discharge Summary (Signed)
Discharge Summary    Patient ID: Mark Baker MRN: [id number]; DOB: 01-12-1981  Admit date: 05/02/2023 Discharge date: 05/07/2023  PCP:  Sherren Mocha, MD   [facility] Providers Cardiologist:  None  Advanced Heart Failure:  [name], MD  {   Discharge Diagnoses    Principal Problem:   Acute systolic heart failure [facility]) Active Problems:   Sleep apnea   Morbid obesity (HCC)   HTN (hypertension)   CKD stage 3b, GFR 30-44 ml/min (HCC)   Paroxysmal atrial fibrillation (HCC)    Diagnostic Studies/Procedures    Echocardiogram 05/03/2023 . Left ventricular ejection fraction, by estimation, is 20 to 25%. The  left ventricle has severely decreased function. The left ventricle  demonstrates global hypokinesis. The left ventricular internal cavity size  was severely dilated. There is moderate   left ventricular hypertrophy. Left ventricular diastolic parameters are  consistent with Grade II diastolic dysfunction (pseudonormalization).   2. Right ventricular systolic function is normal. The right ventricular  size is normal. There is moderately elevated pulmonary artery systolic  pressure. The estimated right ventricular systolic pressure is 46.4 mmHg.   3. Left atrial size was severely dilated.   4. Right atrial size was severely dilated.   5. The mitral valve is normal in structure. Moderate mitral valve  regurgitation. No evidence of mitral stenosis.   6. The aortic valve is normal in structure. Aortic valve regurgitation is  not visualized. No aortic stenosis is present.   7. Aortic dilatation noted. There is mild dilatation of the aortic root,  measuring 40 mm.   8. The inferior vena cava is dilated in size with <50% respiratory  variability, suggesting right atrial pressure of 15 mmHg.   _____________   History of Present Illness     Mark Baker is a 42 y.o. male with chronic HFrEF, nonischemic cardiomyopathy, CKD stage IIIb, paroxysmal A-fib on  Coumadin, hypertension, OSA on CPAP, and morbid obesity.  Is followed by [facility] in [place].  Known history of nonischemic dilated cardiomyopathy with recurrent issues of volume requiring hospital admission with IV diuretics.  He had a cath here in 2018 that showed minimal nonobstructive CAD, mild pulmonary hypertension.  EF is persistently around 20 to 25%.  Currently not a candidate for ICD due to morbid obesity.   On 05/02/2023 he is admitted to the hospital under our services due to 1 week of chest pain and shortness of breath, orthopnea, decreased urinary output, 30+ weight gain in 1 month.  He has had progressive symptoms and was admitted to undergo IV diuretics.Troponins were flat 57-48.  BNP 1274.  Creatinine on arrival 1.88.  EKG no acute ST-T wave changes.  Evidence of moderate pulmonary interstitial edema on chest x-ray.   Hospital Course     Consultants:    Acute on chronic HFrEF EF 20 to 25% Nonischemic cardiomyopathy Morbid obesity Echocardiogram shows EF 20 to 25% with moderate LVH.  Normal RV function.  Grade 2 diastolic dysfunction.  Moderate MR.  He diuresed well here on IV Lasix 80 mg daily and transition to p.o. diuretics.  Weight on admission 454 pounds.  Weight today 422 pounds.  Continue Farxiga 10 mg daily, carvedilol 12.5 mg twice daily, losartan 100 mg daily (intolerant to [place]), spironolactone 25 mg daily, torsemide 40 mg daily, imdur 60mg .  Stop sildenafil with Imdur.  Discussed in depth about the need for compliancy with medications and need for daily weights.   CKD stage IIIb Stable renal function  here.  Creatinine at discharge 2.03.  On admission 1.88.  Paroxysmal atrial fibrillation Maintaining normal sinus rhythm here.  He stopped his digoxin.  Continue amiodarone 20 mg daily for maintenance.  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  Continue to follow outpatient  Patient seen and  examined by myself and Dr. Flora Lipps and deemed stable for discharge.  Patient will follow-up with his primary cardiologist in [place].  His instructions to follow-up in 1 to 2 weeks.  Medications will be sent to our [facility] pharmacy.   Did the patient have an acute coronary syndrome (MI, NSTEMI, STEMI, etc) this admission?:  No.   The elevated Troponin was due to the acute medical illness (demand ischemia).         _____________  Discharge Vitals Blood pressure 99/76, pulse 68, temperature 98 F (36.7 C), temperature source Oral, resp. rate 20, height 6\' 2"  (1.88 m), weight (!) 191.7 kg, SpO2 98%.  Filed Weights   05/05/23 0441 05/06/23 0450 05/07/23 0348  Weight: (!) 195.5 kg (!) 193.1 kg (!) 191.7 kg   Physical Exam Constitutional:      Appearance: He is obese.  Cardiovascular:     Rate and Rhythm: Normal rate and regular rhythm.     Pulses: Normal pulses.     Heart sounds: Normal heart sounds.  Pulmonary:     Effort: Pulmonary effort is normal.     Breath sounds: Normal breath sounds.  Musculoskeletal:        General: Swelling present.     Comments: Mild    Skin:    General: Skin is warm.     Capillary Refill: Capillary refill takes less than 2 seconds.  Neurological:     Mental Status: He is alert.      Labs & Radiologic Studies    CBC No results for input(s): "WBC", "NEUTROABS", "HGB", "HCT", "MCV", "PLT" in the last 72 hours. Basic Metabolic Panel Recent Labs    14/78/29 1243 05/05/23 0246 05/06/23 0219  NA 137 135 137  K 3.9 3.2* 3.9  CL 93* 89* 93*  CO2 31 36* 32  GLUCOSE 109* 88 86  BUN 37* 39* 39*  CREATININE 1.91* 2.05* 2.03*  CALCIUM 9.2 8.9 8.9  MG 2.3  --   --    Liver Function Tests No results for input(s): "AST", "ALT", "ALKPHOS", "BILITOT", "PROT", "ALBUMIN" in the last 72 hours. No results for input(s): "LIPASE", "AMYLASE" in the last 72 hours. High Sensitivity Troponin:   Recent Labs  Lab 05/02/23 0838 05/02/23 1019  TROPONINIHS 57*  48*    BNP Invalid input(s): "POCBNP" D-Dimer No results for input(s): "DDIMER" in the last 72 hours. Hemoglobin A1C No results for input(s): "HGBA1C" in the last 72 hours. Fasting Lipid Panel No results for input(s): "CHOL", "HDL", "LDLCALC", "TRIG", "CHOLHDL", "LDLDIRECT" in the last 72 hours. Thyroid Function Tests No results for input(s): "TSH", "T4TOTAL", "T3FREE", "THYROIDAB" in the last 72 hours.  Invalid input(s): "FREET3" _____________  ECHOCARDIOGRAM COMPLETE  Result Date: 05/03/2023    ECHOCARDIOGRAM REPORT   Patient Name:   Eulon Aldredge Date of Exam: 05/03/2023 Medical Rec #:  [id number]   Height:       74.0 in Accession #:    [id number]  Weight:       448.2 lb Date of Birth:  1981/03/18  BSA:          3.063 m Patient Age:    42 years  BP:           114/92 mmHg Patient Gender: M           HR:           71 bpm. Exam Location:  Inpatient Procedure: 2D Echo, Cardiac Doppler, Color Doppler and Intracardiac            Opacification Agent Indications:    CHF-Acute Systolic I50.21  History:        Patient has prior history of Echocardiogram examinations, most                 recent 11/26/2017. CHF and Cardiomyopathy; Risk                 Factors:Hypertension, Sleep Apnea, Dyslipidemia and Non-Smoker.  Sonographer:    Aron Baba Referring Phys: [id number] DENNIS NARCISSE JR  Sonographer Comments: Technically difficult study due to poor echo windows. IMPRESSIONS  1. Left ventricular ejection fraction, by estimation, is 20 to 25%. The left ventricle has severely decreased function. The left ventricle demonstrates global hypokinesis. The left ventricular internal cavity size was severely dilated. There is moderate  left ventricular hypertrophy. Left ventricular diastolic parameters are consistent with Grade II diastolic dysfunction (pseudonormalization).  2. Right ventricular systolic function is normal. The right ventricular size is normal. There is moderately elevated pulmonary artery systolic  pressure. The estimated right ventricular systolic pressure is 46.4 mmHg.  3. Left atrial size was severely dilated.  4. Right atrial size was severely dilated.  5. The mitral valve is normal in structure. Moderate mitral valve regurgitation. No evidence of mitral stenosis.  6. The aortic valve is normal in structure. Aortic valve regurgitation is not visualized. No aortic stenosis is present.  7. Aortic dilatation noted. There is mild dilatation of the aortic root, measuring 40 mm.  8. The inferior vena cava is dilated in size with <50% respiratory variability, suggesting right atrial pressure of 15 mmHg. FINDINGS  Left Ventricle: Left ventricular ejection fraction, by estimation, is 20 to 25%. The left ventricle has severely decreased function. The left ventricle demonstrates global hypokinesis. Definity contrast agent was given IV to delineate the left ventricular endocardial borders. The left ventricular internal cavity size was severely dilated. There is moderate left ventricular hypertrophy. Left ventricular diastolic parameters are consistent with Grade II diastolic dysfunction (pseudonormalization). Right Ventricle: The right ventricular size is normal. No increase in right ventricular wall thickness. Right ventricular systolic function is normal. There is moderately elevated pulmonary artery systolic pressure. The tricuspid regurgitant velocity is 2.80 m/s, and with an assumed right atrial pressure of 15 mmHg, the estimated right ventricular systolic pressure is 46.4 mmHg. Left Atrium: Left atrial size was severely dilated. Right Atrium: Right atrial size was severely dilated. Pericardium: There is no evidence of pericardial effusion. Mitral Valve: The mitral valve is normal in structure. Moderate mitral valve regurgitation. No evidence of mitral valve stenosis. Tricuspid Valve: The tricuspid valve is normal in structure. Tricuspid valve regurgitation is mild . No evidence of tricuspid stenosis. Aortic  Valve: The aortic valve is normal in structure. Aortic valve regurgitation is not visualized. No aortic stenosis is present. Pulmonic Valve: The pulmonic valve was normal in structure. Pulmonic valve regurgitation is mild. No evidence of pulmonic stenosis. Aorta: Aortic dilatation noted. There is mild dilatation of the aortic root, measuring 40 mm. Venous: The inferior vena cava is dilated in size with less than 50% respiratory variability, suggesting right atrial pressure of 15 mmHg. IAS/Shunts: No atrial level  shunt detected by color flow Doppler.  LEFT VENTRICLE PLAX 2D LVIDd:         7.70 cm   Diastology LVIDs:         6.50 cm   LV e' medial:    6.31 cm/s LV PW:         1.60 cm   LV E/e' medial:  20.0 LV IVS:        0.90 cm   LV e' lateral:   6.22 cm/s LVOT diam:     2.10 cm   LV E/e' lateral: 20.3 LV SV:         43 LV SV Index:   14 LVOT Area:     3.46 cm  RIGHT VENTRICLE RV S prime:     12.70 cm/s TAPSE (M-mode): 2.1 cm LEFT ATRIUM            Index        RIGHT ATRIUM           Index LA diam:      7.10 cm  2.32 cm/m   RA Area:     37.40 cm LA Vol (A4C): 240.0 ml 78.36 ml/m  RA Volume:   145.00 ml 47.35 ml/m  AORTIC VALVE             PULMONIC VALVE LVOT Vmax:   72.10 cm/s  PR End Diast Vel: 16.89 msec LVOT Vmean:  44.500 cm/s LVOT VTI:    0.124 m  AORTA Ao Root diam: 4.00 cm Ao Asc diam:  3.40 cm MITRAL VALVE                 TRICUSPID VALVE MV Area (PHT): 7.99 cm      TR Peak grad:   31.4 mmHg MV Decel Time: 95 msec       TR Vmax:        280.00 cm/s MR Peak grad:   68.2 mmHg MR Mean grad:   37.0 mmHg    SHUNTS MR Vmax:        413.00 cm/s  Systemic VTI:  0.12 m MR Vmean:       284.0 cm/s   Systemic Diam: 2.10 cm MR PISA:        1.01 cm MR PISA Radius: 0.40 cm MV E velocity: 126.00 cm/s MV A velocity: 36.50 cm/s MV E/A ratio:  3.45 Donato Schultz MD Electronically signed by Donato Schultz MD Signature Date/Time: 05/03/2023/4:19:47 PM    Final    DG Chest Port 1 View  Result Date: 05/02/2023 CLINICAL DATA:   Left chest pain for 1 week EXAM: PORTABLE CHEST 1 VIEW COMPARISON:  Chest radiograph 07/31/2018 FINDINGS: The heart is markedly enlarged. The upper mediastinal contours are normal There is pulmonary vascular congestion and moderate pulmonary interstitial edema. There is no definite focal consolidation. There is no definite pleural effusion. There is no pneumothorax There is no acute osseous abnormality. IMPRESSION: Cardiomegaly and moderate pulmonary interstitial edema. Electronically Signed   By: Lesia Hausen M.D.   On: 05/02/2023 09:45   Disposition   Pt is being discharged home today in good condition.  Follow-up Plans & Appointments     Follow-up Information     [facility] Follow [facility].   Why: Follow-up with your primary cardiologist in 1 to 2 weeks. Contact information: [address] [place] [address] [phone]                   Discharge Medications   Allergies  as of 05/07/2023       Reactions   Aspirin Hives, Itching, Rash   Entresto [sacubitril-valsartan] Other (See Comments), Cough   Was on Entresto 07/2016-09/2016        Medication List     STOP taking these medications    Apixaban Starter Pack (10mg  and 5mg ) Commonly known as: ELIQUIS STARTER PACK   digoxin 0.25 MG tablet Commonly known as: LANOXIN   hydrALAZINE 50 MG tablet Commonly known as: APRESOLINE   sildenafil 50 MG tablet Commonly known as: VIAGRA       TAKE these medications    amiodarone 200 MG tablet Commonly known as: PACERONE Take 200 mg by mouth daily. For AFIB.   atorvastatin 40 MG tablet Commonly known as: LIPITOR Take 1 tablet (40 mg total) by mouth daily.   carvedilol 12.5 MG tablet Commonly known as: COREG Take 12.5 mg by mouth 2 (two) times daily.   Farxiga 10 MG Tabs tablet Generic drug: dapagliflozin propanediol Take 1 tablet (10 mg total) by mouth daily.   isosorbide mononitrate 60 MG 24 hr tablet Commonly known as: IMDUR Take 1 tablet (60 mg  total) by mouth daily.   liraglutide 18 MG/3ML Sopn Commonly known as: VICTOZA Inject 0.6 mg into the skin daily.   losartan 100 MG tablet Commonly known as: COZAAR Take 100 mg by mouth daily.   spironolactone 25 MG tablet Commonly known as: ALDACTONE Take 1 tablet (25 mg total) by mouth daily. What changed: how much to take   torsemide 20 MG tablet Commonly known as: DEMADEX Take 60 mg by mouth daily. Indications: accumulation of fluid resulting from chronic heart failure.   warfarin 4 MG tablet Commonly known as: COUMADIN Take 4 mg by mouth every evening.           Outstanding Labs/Studies    Duration of Discharge Encounter   Greater than 30 minutes including physician time.  Signed, Abagail Kitchens, PA-C 05/07/2023, 8:22 AM

## 2023-05-07 NOTE — TOC Transition Note (Signed)
Transition of Care [facility]) - CM/SW Discharge Note   Patient Details  Name: Mark Baker MRN: [id number] Date of Birth: [birth year]  [facility] CM/SW Contact:  Leone Haven, RN Phone Number: 05/07/2023, 8:55 AM   Clinical Narrative:    Patient is for dc today, he wants a quad cane,  he is ok with [employer] supplying this for him.  NCM made referral to [facility] with [employer], rep will bring cane to room prior to dc.   Final next level of care: Home/Self Care Barriers to Discharge: No Barriers Identified   Patient Goals and CMS Choice   Choice offered to / list presented to : NA  Discharge Placement                         Discharge Plan and Services Additional resources added to the After Visit Summary for   In-house Referral: NA Discharge Planning Services: CM Consult Post Acute Care Choice: NA          DME Arranged: N/A DME Agency: NA       HH Arranged: NA          Social Determinants of Health (SDOH) Interventions SDOH Screenings   Food Insecurity: Unknown (05/02/2023)  Housing: Low Risk  (05/02/2023)  Transportation Needs: No Transportation Needs (05/02/2023)  Utilities: Not At Risk (05/02/2023)  Financial Resource Strain: Low Risk  (01/14/2022)   [facility]  Physical Activity: Sufficiently Active (01/14/2022)   [facility]  Social Connections: Feeling Socially Integrated (02/26/2022)   [facility]  Tobacco Use: Low Risk  (05/02/2023)  Health Literacy: Adequate Health Literacy (02/26/2022)   [facility]     Readmission Risk Interventions     No data to display

## 2023-05-07 NOTE — Plan of Care (Signed)

## 2023-05-07 NOTE — Progress Notes (Signed)
NT reported to RN that patient is experiencing GI upset and passed stool "with blood in it". Stool flushed before witnessed by RN. Reported to on-call coverage; received verbal order for CBC. Also ordered Mylanta via cardiology prn medication orderset.

## 2023-05-07 NOTE — Progress Notes (Signed)
Pt IV and Tele has been removed by Assigned nurse, Writer went over Discharge instructions, Paged MD for Doctors that was printed and delivered.  Waiting on Cane ride in 20 mins away and will be called when cane has arrived.  Will pick up medications on the way to main entrance.

## 2023-12-12 IMAGING — CR DG KNEE COMPLETE 4+V*R*
4 series · 4 of 4 positions shown · non-contrast
Comparison: No priors.

CLINICAL DATA: 40-year-old male with history of right-sided knee
pain.

EXAM:
RIGHT KNEE - COMPLETE 4+ VIEW

[knee ap]
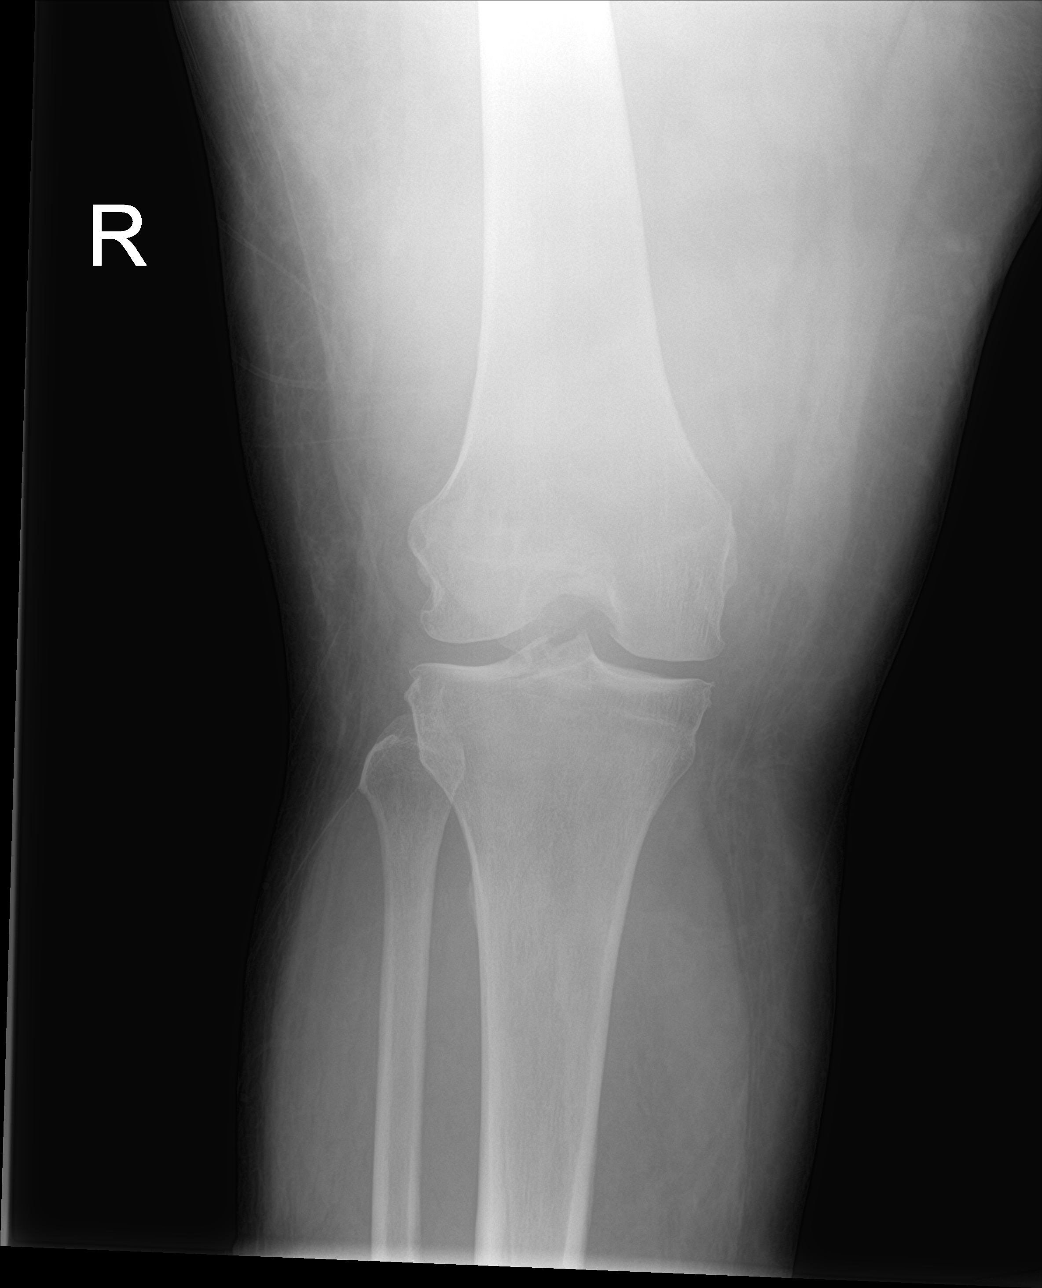

[knee lat]
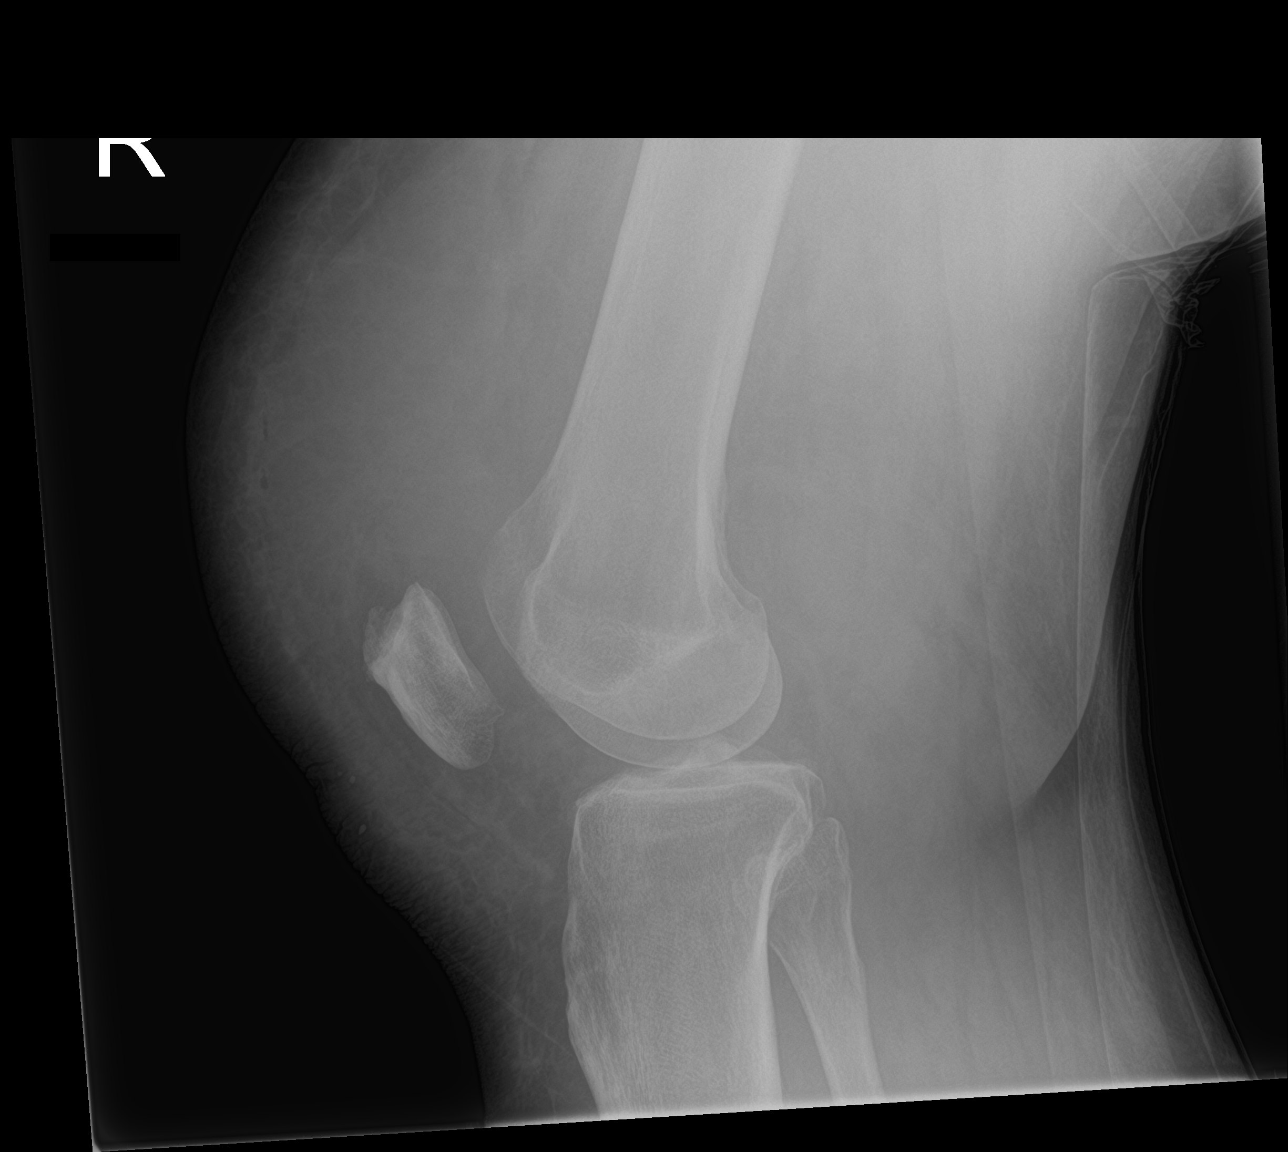

[knee obl (1 of 2)]
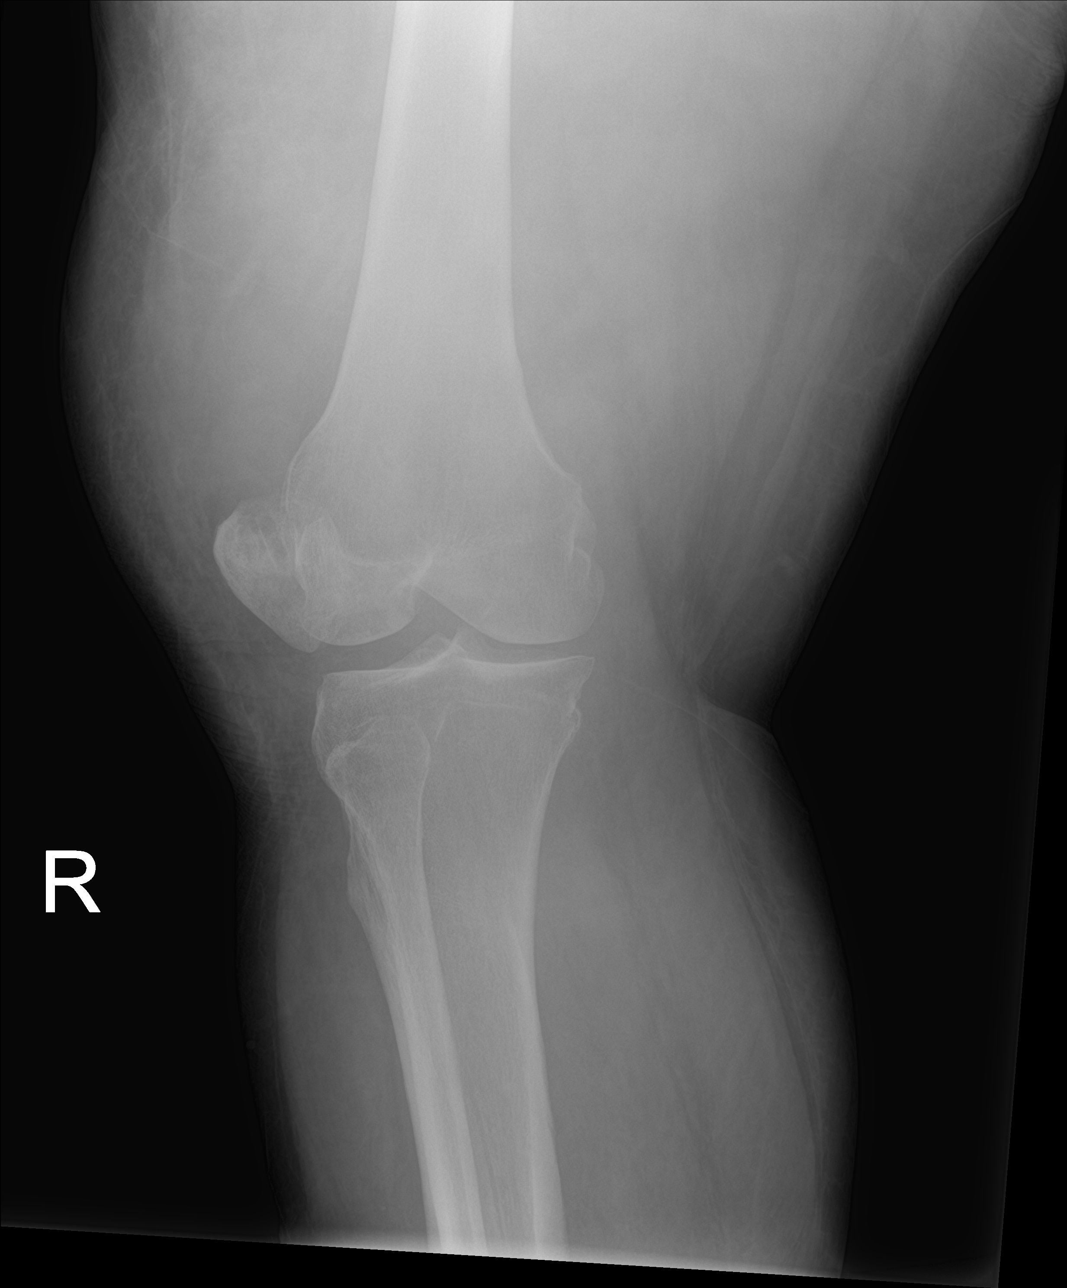

[knee obl (2 of 2)]
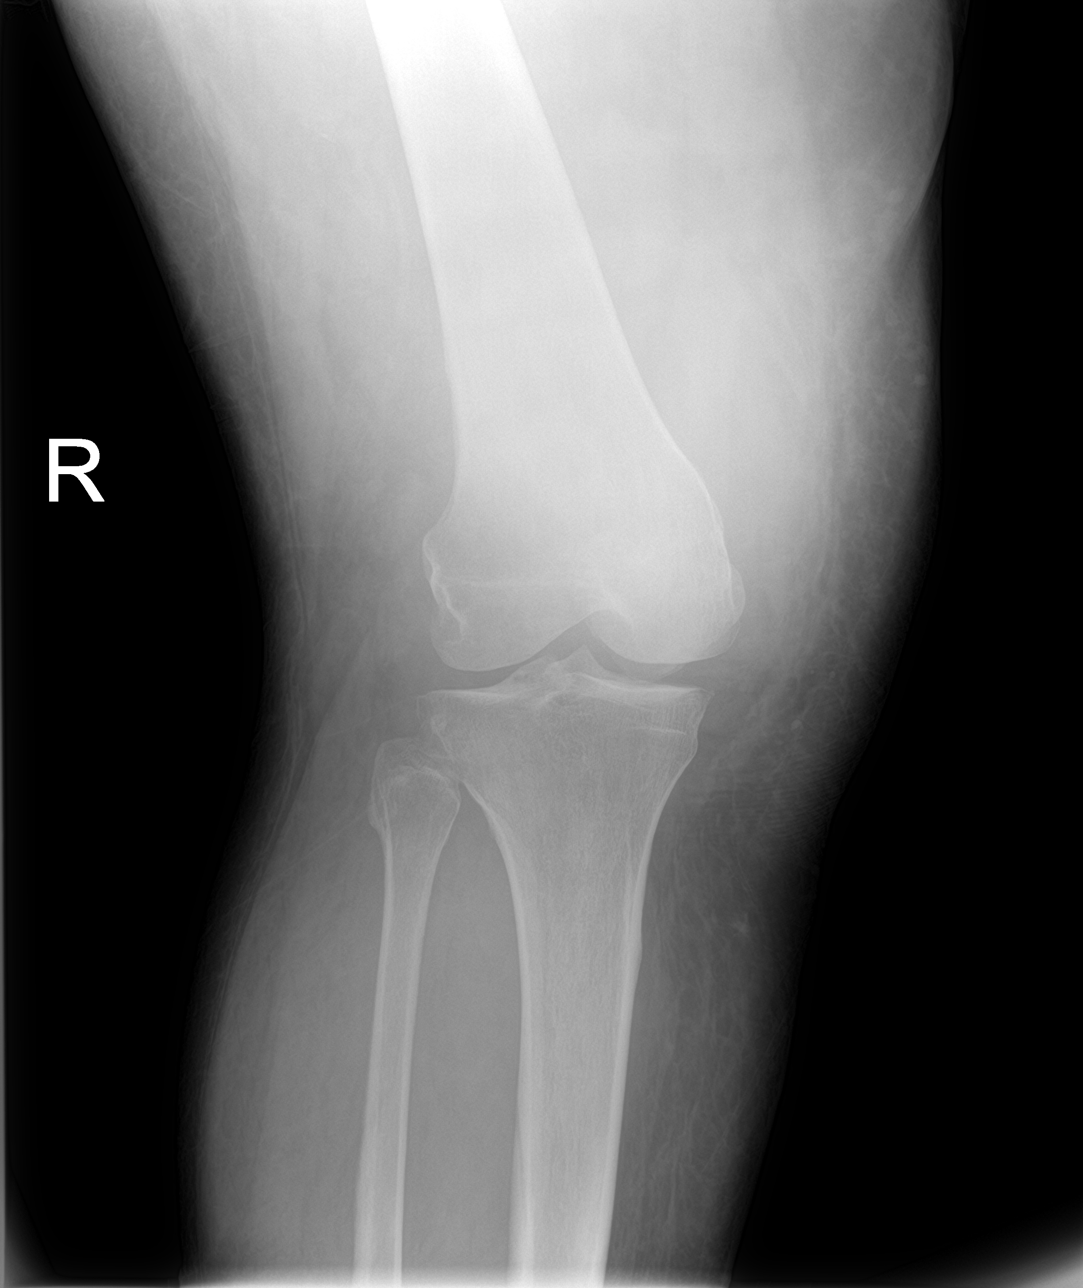

[4 of 4 positions shown; findings below may reference images not displayed]

FINDINGS: Extensive soft tissue swelling is noted in the suprapatellar region.
Suprapatellar effusion. No acute displaced fracture, subluxation or
dislocation. There is some joint space narrowing, subchondral
sclerosis and mild osteophyte formation in the knee joint, in a
tricompartmental distribution.
IMPRESSION: 1. No acute osseous abnormality of the right knee.
2. Suprapatellar joint effusion.
3. In addition, there is extensive soft tissue swelling in the
inferior aspect of the quadriceps region. Correlation with physical
examination is recommended. Differential considerations are broad,
including soft tissue tumor, abscess or hematoma.
4. Mild tricompartmental osteoarthritis.

## 2024-04-12 DEATH — deceased
